# Patient Record
Sex: Female | Born: 1985 | Race: Black or African American | Hispanic: No | Marital: Single | Smoking: Former smoker
Health system: Southern US, Community
[De-identification: ages and names within clinical notes are randomized; demographics above are authoritative.]

## PROBLEM LIST (undated history)

## (undated) DIAGNOSIS — D571 Sickle-cell disease without crisis: Secondary | ICD-10-CM

---

## 2017-01-23 ENCOUNTER — Emergency Department (HOSPITAL_COMMUNITY): Payer: Medicaid Other

## 2017-01-23 ENCOUNTER — Encounter (HOSPITAL_COMMUNITY): Payer: Self-pay | Admitting: Oncology

## 2017-01-23 ENCOUNTER — Emergency Department (HOSPITAL_COMMUNITY)
Admission: EM | Admit: 2017-01-23 | Discharge: 2017-01-23 | Disposition: A | Discharge status: Home / Self Care | Payer: Medicaid Other | Attending: Emergency Medicine | Admitting: Emergency Medicine

## 2017-01-23 DIAGNOSIS — Y939 Activity, unspecified: Secondary | ICD-10-CM | POA: Diagnosis not present

## 2017-01-23 DIAGNOSIS — F17200 Nicotine dependence, unspecified, uncomplicated: Secondary | ICD-10-CM | POA: Insufficient documentation

## 2017-01-23 DIAGNOSIS — Y929 Unspecified place or not applicable: Secondary | ICD-10-CM | POA: Insufficient documentation

## 2017-01-23 DIAGNOSIS — M542 Cervicalgia: Secondary | ICD-10-CM

## 2017-01-23 DIAGNOSIS — Y999 Unspecified external cause status: Secondary | ICD-10-CM | POA: Diagnosis not present

## 2017-01-23 DIAGNOSIS — S1093XA Contusion of unspecified part of neck, initial encounter: Secondary | ICD-10-CM | POA: Diagnosis not present

## 2017-01-23 DIAGNOSIS — Z79899 Other long term (current) drug therapy: Secondary | ICD-10-CM | POA: Diagnosis not present

## 2017-01-23 DIAGNOSIS — S199XXA Unspecified injury of neck, initial encounter: Secondary | ICD-10-CM | POA: Diagnosis present

## 2017-01-23 HISTORY — DX: Sickle-cell disease without crisis: D57.1

## 2017-01-23 LAB — POC URINE PREG, ED: Preg Test, Ur: NEGATIVE

## 2017-01-23 MED ORDER — ACETAMINOPHEN 500 MG PO TABS
1000.0000 mg | ORAL_TABLET | Freq: Once | ORAL | Status: AC
  Administered 2017-01-23: 1000 mg via ORAL
  Filled 2017-01-23: qty 2

## 2017-01-23 NOTE — Discharge Instructions (Signed)
X-rays show no fracture. You will be sore for several days. Tylenol or ibuprofen for pain.
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
## 2017-01-23 NOTE — ED Notes (Signed)
Patient says she needs place to go. SW consult put in.
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
## 2017-02-01 ENCOUNTER — Emergency Department (HOSPITAL_COMMUNITY)
Admission: EM | Admit: 2017-02-01 | Discharge: 2017-02-01 | Disposition: A | Discharge status: Home / Self Care | Payer: Medicaid Other | Attending: Emergency Medicine | Admitting: Emergency Medicine

## 2017-02-01 ENCOUNTER — Encounter (HOSPITAL_COMMUNITY): Payer: Self-pay | Admitting: Emergency Medicine

## 2017-02-01 ENCOUNTER — Emergency Department (HOSPITAL_COMMUNITY): Payer: Medicaid Other

## 2017-02-01 DIAGNOSIS — D5700 Hb-SS disease with crisis, unspecified: Secondary | ICD-10-CM | POA: Insufficient documentation

## 2017-02-01 DIAGNOSIS — F17200 Nicotine dependence, unspecified, uncomplicated: Secondary | ICD-10-CM | POA: Diagnosis not present

## 2017-02-01 DIAGNOSIS — M79604 Pain in right leg: Secondary | ICD-10-CM | POA: Diagnosis present

## 2017-02-01 LAB — COMPREHENSIVE METABOLIC PANEL
ALBUMIN: 3.8 g/dL (ref 3.5–5.0)
ALT: 21 U/L (ref 14–54)
AST: 24 U/L (ref 15–41)
Alkaline Phosphatase: 42 U/L (ref 38–126)
Anion gap: 3 — ABNORMAL LOW (ref 5–15)
BILIRUBIN TOTAL: 1.1 mg/dL (ref 0.3–1.2)
BUN: 8 mg/dL (ref 6–20)
CHLORIDE: 109 mmol/L (ref 101–111)
CO2: 26 mmol/L (ref 22–32)
Calcium: 8.9 mg/dL (ref 8.9–10.3)
Creatinine, Ser: 0.69 mg/dL (ref 0.44–1.00)
GFR calc Af Amer: >60 mL/min (ref >60)
GFR calc non Af Amer: >60 mL/min (ref >60)
GLUCOSE: 90 mg/dL (ref 65–99)
POTASSIUM: 4.3 mmol/L (ref 3.5–5.1)
Sodium: 138 mmol/L (ref 135–145)
Total Protein: 6.4 g/dL — ABNORMAL LOW (ref 6.5–8.1)

## 2017-02-01 LAB — CBC WITH DIFFERENTIAL/PLATELET
BASOS PCT: 1 %
Basophils Absolute: 0.1 10*3/uL (ref 0.0–0.1)
EOS PCT: 3 %
Eosinophils Absolute: 0.2 10*3/uL (ref 0.0–0.7)
HEMATOCRIT: 31.3 % — AB (ref 36.0–46.0)
Hemoglobin: 11.4 g/dL — ABNORMAL LOW (ref 12.0–15.0)
LYMPHS ABS: 3 10*3/uL (ref 0.7–4.0)
Lymphocytes Relative: 41 %
MCH: 26.6 pg (ref 26.0–34.0)
MCHC: 36.4 g/dL — AB (ref 30.0–36.0)
MCV: 73 fL — AB (ref 78.0–100.0)
MONOS PCT: 8 %
Monocytes Absolute: 0.6 10*3/uL (ref 0.1–1.0)
NEUTROS ABS: 3.4 10*3/uL (ref 1.7–7.7)
Neutrophils Relative %: 47 %
Platelets: 277 10*3/uL (ref 150–400)
RBC: 4.29 MIL/uL (ref 3.87–5.11)
RDW: 14.8 % (ref 11.5–15.5)
WBC: 7.3 10*3/uL (ref 4.0–10.5)

## 2017-02-01 LAB — RETICULOCYTES
RBC.: 4.29 MIL/uL (ref 3.87–5.11)
RETIC COUNT ABSOLUTE: 175.9 10*3/uL (ref 19.0–186.0)
Retic Ct Pct: 4.1 % — ABNORMAL HIGH (ref 0.4–3.1)

## 2017-02-01 MED ORDER — HYDROMORPHONE HCL 2 MG/ML IJ SOLN
0.5000 mg | INTRAMUSCULAR | Status: AC
  Administered 2017-02-01: 1 mg via INTRAVENOUS
  Filled 2017-02-01: qty 1

## 2017-02-01 MED ORDER — HYDROMORPHONE HCL 2 MG/ML IJ SOLN: 0.5000 mg | INTRAMUSCULAR | Status: AC

## 2017-02-01 MED ORDER — PROMETHAZINE HCL 25 MG PO TABS
25.0000 mg | ORAL_TABLET | ORAL | Status: DC | PRN
  Administered 2017-02-01: 25 mg via ORAL
  Filled 2017-02-01: qty 1

## 2017-02-01 MED ORDER — ONDANSETRON HCL 4 MG/2ML IJ SOLN
4.0000 mg | Freq: Once | INTRAMUSCULAR | Status: AC
  Administered 2017-02-01: 4 mg via INTRAVENOUS
  Filled 2017-02-01: qty 2

## 2017-02-01 MED ORDER — HYDROMORPHONE HCL 2 MG/ML IJ SOLN: 0.5000 mg | INTRAMUSCULAR | Status: DC

## 2017-02-01 MED ORDER — OXYCODONE-ACETAMINOPHEN 5-325 MG PO TABS: 2.0000 | ORAL_TABLET | ORAL | 0 refills | Status: DC | PRN

## 2017-02-01 MED ORDER — HYDROMORPHONE HCL 2 MG/ML IJ SOLN
0.5000 mg | INTRAMUSCULAR | Status: DC
Start: 2017-02-01 — End: 2017-02-02

## 2017-02-01 MED ORDER — KETOROLAC TROMETHAMINE 30 MG/ML IJ SOLN
30.0000 mg | INTRAMUSCULAR | Status: AC
  Administered 2017-02-01: 30 mg via INTRAVENOUS
  Filled 2017-02-01: qty 1

## 2017-02-01 MED ORDER — DIPHENHYDRAMINE HCL 50 MG/ML IJ SOLN
25.0000 mg | Freq: Once | INTRAMUSCULAR | Status: AC
  Administered 2017-02-01: 25 mg via INTRAVENOUS
  Filled 2017-02-01: qty 1

## 2017-02-01 MED ORDER — DEXTROSE-NACL 5-0.45 % IV SOLN
INTRAVENOUS | Status: DC
  Administered 2017-02-01: 17:00:00 via INTRAVENOUS
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
## 2017-02-01 NOTE — ED Notes (Signed)
Patient transported to X-ray 

## 2017-02-01 NOTE — ED Notes (Signed)
ED Provider at bedside. 
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
## 2017-02-01 NOTE — ED Triage Notes (Signed)
Pt here for sickle cell pain in right leg and left arm x 2 days

## 2017-02-01 NOTE — ED Notes (Signed)
Pt vomiting. Zofran given. Pt still rating pain at a 5/10. PA aware. Will cont to monitor.

## 2017-02-18 ENCOUNTER — Ambulatory Visit (INDEPENDENT_AMBULATORY_CARE_PROVIDER_SITE_OTHER): Payer: Medicaid Other | Admitting: Family Medicine

## 2017-02-18 ENCOUNTER — Encounter: Payer: Self-pay | Admitting: Family Medicine

## 2017-02-18 ENCOUNTER — Non-Acute Institutional Stay (HOSPITAL_COMMUNITY)
Admission: AD | Admit: 2017-02-18 | Discharge: 2017-02-18 | Disposition: A | Discharge status: Home / Self Care | Payer: Medicaid Other | Source: Ambulatory Visit | Attending: Internal Medicine | Admitting: Internal Medicine

## 2017-02-18 VITALS — BP 106/75 | HR 78 | Temp 98.5°F | Resp 16 | Ht 62.0 in | Wt 147.0 lb

## 2017-02-18 DIAGNOSIS — Z87891 Personal history of nicotine dependence: Secondary | ICD-10-CM | POA: Diagnosis not present

## 2017-02-18 DIAGNOSIS — D57219 Sickle-cell/Hb-C disease with crisis, unspecified: Secondary | ICD-10-CM | POA: Diagnosis not present

## 2017-02-18 DIAGNOSIS — Z79899 Other long term (current) drug therapy: Secondary | ICD-10-CM | POA: Insufficient documentation

## 2017-02-18 DIAGNOSIS — Z23 Encounter for immunization: Secondary | ICD-10-CM

## 2017-02-18 DIAGNOSIS — D5720 Sickle-cell/Hb-C disease without crisis: Secondary | ICD-10-CM | POA: Insufficient documentation

## 2017-02-18 DIAGNOSIS — M79604 Pain in right leg: Secondary | ICD-10-CM | POA: Diagnosis present

## 2017-02-18 LAB — FERRITIN: FERRITIN: 171 ng/mL — AB (ref 10–154)

## 2017-02-18 MED ORDER — DIPHENHYDRAMINE HCL 25 MG PO CAPS
50.0000 mg | ORAL_CAPSULE | Freq: Once | ORAL | Status: AC
  Administered 2017-02-18: 50 mg via ORAL
  Filled 2017-02-18: qty 2

## 2017-02-18 MED ORDER — HYDROMORPHONE HCL 2 MG/ML IJ SOLN
1.0000 mg | Freq: Once | INTRAMUSCULAR | Status: AC
  Administered 2017-02-18: 1 mg via INTRAVENOUS
  Filled 2017-02-18: qty 1

## 2017-02-18 MED ORDER — HYDROMORPHONE HCL 2 MG/ML IJ SOLN
INTRAMUSCULAR | Status: AC
  Administered 2017-02-18: 1 mg via INTRAVENOUS
  Filled 2017-02-18: qty 1

## 2017-02-18 MED ORDER — PROMETHAZINE HCL 25 MG PO TABS
12.5000 mg | ORAL_TABLET | Freq: Once | ORAL | Status: AC
  Administered 2017-02-18: 12.5 mg via ORAL
  Filled 2017-02-18: qty 1

## 2017-02-18 MED ORDER — IBUPROFEN 800 MG PO TABS: 800.0000 mg | ORAL_TABLET | Freq: Three times a day (TID) | ORAL | 0 refills | Status: DC | PRN

## 2017-02-18 MED ORDER — FOLIC ACID 1 MG PO TABS: 1.0000 mg | ORAL_TABLET | Freq: Every day | ORAL | 11 refills | Status: DC

## 2017-02-18 MED ORDER — HYDROMORPHONE HCL 2 MG/ML IJ SOLN
1.0000 mg | Freq: Once | INTRAMUSCULAR | Status: AC
  Administered 2017-02-18: 1 mg via INTRAVENOUS

## 2017-02-18 MED ORDER — KETOROLAC TROMETHAMINE 30 MG/ML IJ SOLN
30.0000 mg | Freq: Once | INTRAMUSCULAR | Status: AC
  Administered 2017-02-18: 30 mg via INTRAVENOUS
  Filled 2017-02-18: qty 1

## 2017-02-18 MED ORDER — HYDROCODONE-ACETAMINOPHEN 5-325 MG PO TABS: 1.0000 | ORAL_TABLET | Freq: Four times a day (QID) | ORAL | 0 refills | Status: DC | PRN

## 2017-02-18 MED ORDER — ONDANSETRON HCL 4 MG/2ML IJ SOLN
4.0000 mg | Freq: Once | INTRAMUSCULAR | Status: AC
  Administered 2017-02-18: 4 mg via INTRAVENOUS
  Filled 2017-02-18: qty 2

## 2017-02-18 MED ORDER — HYDROMORPHONE HCL 2 MG/ML IJ SOLN
2.0000 mg | Freq: Once | INTRAMUSCULAR | Status: AC
  Administered 2017-02-18: 2 mg via INTRAVENOUS
  Filled 2017-02-18: qty 1

## 2017-02-18 MED ORDER — DEXTROSE-NACL 5-0.45 % IV SOLN
INTRAVENOUS | Status: DC
  Administered 2017-02-18: 13:00:00 via INTRAVENOUS

## 2017-02-18 NOTE — Progress Notes (Signed)
Subjective:    Patient ID: Hannah Hawkins, female    DOB: October 23, 1986, 31 y.o.   MRN: [id number]  HPI [name], a 31 year old female with a history of sickle cell anemia, presents to establish care. Patient recently relocated from [place]. She says that she did not have a primary provider and has been using the emergency department for all primary needs. She is complaining of right leg and ankle pain that is consistent with sickle cell anemia. She attributes current pain crisis to changes in weather. She says that she has not attempted any OTC analgesics to alleviate current symptoms. Pain intensity is currently 8/10 described as constant and throbbing. She denies headache, chest pains, shortness of breath, dysuria, nausea, vomiting, or diarrhea.  Past Medical History:  Diagnosis Date  . Sickle cell anemia (HCC)    Immunization History  Administered Date(s) Administered  . Pneumococcal Polysaccharide-23 02/18/2017  . Tdap 02/18/2017   Social History   Social History  . Marital status: Single    Spouse name: N/A  . Number of children: N/A  . Years of education: N/A   Occupational History  . Not on file.   Social History Main Topics  . Smoking status: Former Games developer  . Smokeless tobacco: Never Used  . Alcohol use Yes  . Drug use: No  . Sexual activity: Yes   Other Topics Concern  . Not on file   Social History Narrative  . No narrative on file   Review of Systems  Constitutional: Negative for unexpected weight change.  HENT: Negative.   Eyes: Negative.  Negative for photophobia and visual disturbance.  Respiratory: Negative.   Cardiovascular: Positive for leg swelling.  Gastrointestinal: Negative.   Endocrine: Negative.  Negative for polydipsia, polyphagia and polyuria.  Genitourinary: Negative.   Musculoskeletal: Positive for arthralgias and myalgias.  Skin: Negative.   Allergic/Immunologic: Negative.  Negative for immunocompromised state.   Neurological: Negative.   Hematological: Negative.   Psychiatric/Behavioral: Negative.        Objective:   Physical Exam  Constitutional: She is oriented to person, place, and time. She appears well-developed and well-nourished.  HENT:  Head: Normocephalic and atraumatic.  Right Ear: External ear normal.  Left Ear: External ear normal.  Mouth/Throat: Oropharynx is clear and moist.  Eyes: Conjunctivae and EOM are normal. Pupils are equal, round, and reactive to light.  Neck: Normal range of motion. Neck supple.  Cardiovascular: Normal rate, regular rhythm, normal heart sounds and intact distal pulses.   Pulmonary/Chest: Effort normal and breath sounds normal.  Abdominal: Soft. Bowel sounds are normal.  Musculoskeletal:       Right knee: She exhibits decreased range of motion.       Right ankle: She exhibits swelling. She exhibits normal pulse.  Neurological: She is alert and oriented to person, place, and time. She has normal reflexes.  Skin: Skin is warm and dry.  Psychiatric: She has a normal mood and affect. Her behavior is normal. Judgment and thought content normal.      BP 106/75 (BP Location: Left Arm, Patient Position: Sitting, Cuff Size: Normal)   Pulse 78   Temp 98.5 F (36.9 C) (Oral)   Resp 16   Ht 5\' 2"  (1.575 m)   Wt 147 lb (66.7 kg)   LMP 02/01/2017   SpO2 100%   BMI 26.89 kg/m  Assessment & Plan:   1. Sickle cell disease, type [place], with unspecified crisis (HCC) Sickle cell disease -  Will start  folic acid 1 mg daily to prevent aplastic bone marrow crises.  Patient will transition to the day infusion center for extended observation and pain management for 8/10 pain related to sickle cell anemia.    Patient will be admitted to the day infusion center for extended observation  Start IV D5.45 for cellular rehydration at 150/hr  Start Toradol 30 mg IV times 1  Start Dilaudid per clinician assisted doses  Patient will be re-evaluated for pain intensity in  the context of function and relationship to baseline as care progresses.  If no significant pain relief, will transfer patient to inpatient services for a higher level of care.     Pulmonary evaluation - Patient denies severe recurrent wheezes, shortness of breath with exercise, or persistent cough. If these symptoms develop, pulmonary function tests with spirometry will be ordered, and if abnormal, plan on referral to Pulmonology for further evaluation.  Cardiac - Baseline EKG Eye -[facility], with Dr. Carmela RimaNarendra Patel  Immunization status - Tdap and Pneumococcal 23 received today. Declined influenza vaccination.   Acute and chronic painful epi+3-sodes - We agreed on hydrocodone-acetaminophen 5-325 mg today.   We discussed that pt is to receive her Schedule II prescriptions only from us. Pt is also aware that the prescription history is available to us online through the [facility] CS[facility]. Controlled substance agreement signed 02/18/2017.  We reminded Ms. Heinkel that all patients receiving Schedule II narcotics must be seen for follow within one month of prescription being requested. We reviewed the terms of our pain agreement, including the need to keep medicines in a safe locked location away from children or pets, and the need to report excess sedation or constipation, measures to avoid constipation, and policies related to early refills and stolen prescriptions. According to the [place] [facility] program, we have reviewed details related to analgesia, adverse effects, aberrant behaviors.  - Hemoglobinopathy evaluation - Vitamin D, 25-hydroxy - Ferritin - EKG 12-Lead - HYDROcodone-acetaminophen (NORCO/VICODIN) 5-325 MG tablet; Take 1 tablet by mouth every 6 (six) hours as needed for moderate pain.  Dispense: 30 tablet; Refill: 0 - ibuprofen (ADVIL,MOTRIN) 800 MG tablet; Take 1 tablet (800 mg total) by mouth every 8 (eight) hours as needed for moderate  pain.  Dispense: 30 tablet; Refill: 0 - Hemoglobinopathy Evaluation  2. Immunization due - Pneumococcal polysaccharide vaccine 23-valent greater than or equal to 2yo subcutaneous/IM  3. Need for Tdap vaccination  - Tdap vaccine greater than or equal to 7yo IM      RTC: 1 month for sickle cell anemia
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
## 2017-02-18 NOTE — H&P (Signed)
[facility] History and Physical   Date: 02/18/2017  Patient name: Hannah Hawkins Medical record number: [id number] Date of birth: June 25, 1986 Age: 31 y.o. Gender: female PCP: Massie Maroon, FNP  Attending physician: Quentin Angst, MD  Chief Complaint: Right lower extremity pain  History of Present Illness: [name], a 31 year old female with a history of sickle cell anemia, HbSC presents complaining of pain to right lower extremity several days ago that is consistent with previous sickle cell crisis. She recently relocated from [place]. Patient is opiate naive, she has never had a primary care provider and typically goes to the emergency department for all sickle cell needs. She attributes current crisis to changes in weather. Current pain intensity is 8/10 described as constant and throbbing. She has not used any prescribed or OTC analgesics to alleviate current symptoms. She says that pain is aggravated by activity. She denies headache, chest pain, SOB, dysuria, nausea, vomiting, or diarrhea.   Meds: Prescriptions Prior to Admission  Medication Sig Dispense Refill Last Dose  . HYDROcodone-acetaminophen (NORCO/VICODIN) 5-325 MG tablet Take 1 tablet by mouth every 6 (six) hours as needed for moderate pain. 30 tablet 0   . ibuprofen (ADVIL,MOTRIN) 800 MG tablet Take 1 tablet (800 mg total) by mouth every 8 (eight) hours as needed for moderate pain. 30 tablet 0   . omeprazole (PRILOSEC) 20 MG capsule Take 20 mg by mouth.   Not Taking  . sucralfate (CARAFATE) 1 g tablet Take 1 g by mouth 4 (four) times daily.   Not Taking    Allergies: Penicillins Past Medical History:  Diagnosis Date  . Sickle cell anemia (HCC)    No past surgical history on file. No family history on file. Social History   Social History  . Marital status: Single    Spouse name: N/A  . Number of children: N/A  . Years of education: N/A   Occupational History  . Not on file.    Social History Main Topics  . Smoking status: Former Games developer  . Smokeless tobacco: Never Used  . Alcohol use Yes  . Drug use: No  . Sexual activity: Yes   Other Topics Concern  . Not on file   Social History Narrative  . No narrative on file    Review of Systems: Constitutional: negative for fatigue Eyes: negative Ears, nose, mouth, throat, and face: negative Respiratory: negative for chronic bronchitis, dyspnea on exertion and wheezing Cardiovascular: negative for chest pain, dyspnea, orthopnea, palpitations and tachypnea Gastrointestinal: negative for diarrhea, nausea and reflux symptoms Genitourinary:negative for dysuria Integument/breast: negative Hematologic/lymphatic: negative Musculoskeletal:positive for myalgias Neurological: negative Behavioral/Psych: negative Endocrine: negative Allergic/Immunologic: negative  Physical Exam: Last menstrual period 02/01/2017. BP 108/67 (BP Location: Right Arm)   Pulse 62   Temp 98.3 F (36.8 C) (Oral)   LMP 02/01/2017   SpO2 100%   General Appearance:    Alert, cooperative, no distress, appears stated age  Head:    Normocephalic, without obvious abnormality, atraumatic  Eyes:    PERRL, conjunctiva/corneas clear, EOM's intact, fundi    benign, both eyes  Ears:    Normal TM's and external ear canals, both ears  Nose:   Nares normal, septum midline, mucosa normal, no drainage    or sinus tenderness  Throat:   Lips, mucosa, and tongue normal; teeth and gums normal  Neck:   Supple, symmetrical, trachea midline, no adenopathy;    thyroid:  no enlargement/tenderness/nodules; no carotid   bruit or JVD  Back:     Symmetric, no curvature, ROM normal, no CVA tenderness  Lungs:     Clear to auscultation bilaterally, respirations unlabored  Chest Wall:    No tenderness or deformity   Heart:    Regular rate and rhythm, S1 and S2 normal, no murmur, rub   or gallop  Abdomen:     Soft, non-tender, bowel sounds active all four  quadrants,    no masses, no organomegaly  Extremities:   Extremities normal, atraumatic, no cyanosis. Bilateral lower extremity edema (trace)  Pulses:   2+ and symmetric all extremities  Skin:   Skin color, texture, turgor normal, no rashes or lesions  Lymph nodes:   Cervical, supraclavicular, and axillary nodes normal  Neurologic:   CNII-XII intact, normal strength, sensation and reflexes    throughout    Lab results: No results found for this or any previous visit (from the past 24 hour(s)).  Imaging results:  No results found.   Assessment & Plan:  Patient will be admitted to the day infusion center for extended observation  Start IV D5.45 for cellular rehydration at 150/hr  Start Toradol 30 mg IV every 6 hours times one  Start Dilaudid IV times one  Patient will be re-evaluated for pain intensity in the context of function and relationship to baseline as care progresses.  If no significant pain relief, will transfer patient to inpatient services for a higher level of care.   Will check CMP, reticulocytes and CBC w/differential   Hannah Hawkins M 02/18/2017, 12:09 PM

## 2017-02-18 NOTE — Progress Notes (Signed)
Patient ID: Hannah Hawkins, female   DOB: 1986-10-23, 89[name]1 y.o.   MRN: [id number]  [facility] 10/10 pain in her legs and arms. Pt received IV fluids and medications; tolerated treatment well.  Pt rated her pain 0/10 at discharge. Unable to provide ordered urine sample, no urine obtained. Pt sleepy but easily arousable with stable vital signs at time of discharge.  Pt discharged to home accompanied by friend.  Ardyth GalAnderson, Ximenna Fonseca Ann, RN 02/18/2017

## 2017-02-19 LAB — VITAMIN D 25 HYDROXY (VIT D DEFICIENCY, FRACTURES): VIT D 25 HYDROXY: 26 ng/mL — AB (ref 30–100)

## 2017-02-21 ENCOUNTER — Other Ambulatory Visit: Payer: Self-pay | Admitting: Family Medicine

## 2017-02-21 DIAGNOSIS — E559 Vitamin D deficiency, unspecified: Secondary | ICD-10-CM

## 2017-02-21 MED ORDER — ERGOCALCIFEROL 1.25 MG (50000 UT) PO CAPS: 50000.0000 [IU] | ORAL_CAPSULE | ORAL | 3 refills | Status: DC

## 2017-02-21 NOTE — Progress Notes (Signed)
Called, no answer. Left message for patient to return call. Thanks!  

## 2017-02-22 LAB — HEMOGLOBINOPATHY EVALUATION
HEMATOCRIT: 38.5 % (ref 35.0–45.0)
HGB A: 0 % — AB (ref >96.0)
Hemoglobin C  map: 45.4 % — ABNORMAL HIGH
Hemoglobin: 12.6 g/dL (ref 11.7–15.5)
Hgb A2 Quant: 4.8 % — ABNORMAL HIGH (ref 1.8–3.5)
Hgb F Quant: 1.6 % (ref <2.0)
Hgb S Quant: 48.2 % — ABNORMAL HIGH
MCH: 27.2 pg (ref 27.0–33.0)
MCV: 83 fL (ref 80.0–100.0)
RBC: 4.64 MIL/uL (ref 3.80–5.10)
RDW: 15.7 % — AB (ref 11.0–15.0)

## 2017-02-22 MED ORDER — ERGOCALCIFEROL 1.25 MG (50000 UT) PO CAPS: 50000.0000 [IU] | ORAL_CAPSULE | ORAL | 3 refills | Status: DC

## 2017-02-22 NOTE — Progress Notes (Signed)
Called, spoke with patient. Advised of low vitamin D levels and the needs to take vitamin D once weekly. Patient verbalized understanding. Thanks!

## 2017-02-22 NOTE — Addendum Note (Signed)
Addended by: Loney HeringBATTEN, Hamsini Verrilli E on: 02/22/2017 01:37 PM   Modules accepted: Orders

## 2017-02-24 ENCOUNTER — Encounter: Payer: Self-pay | Admitting: Family Medicine

## 2017-03-09 HISTORY — PX: EYE SURGERY: SHX253

## 2017-03-18 ENCOUNTER — Encounter (HOSPITAL_COMMUNITY): Payer: Self-pay | Admitting: Emergency Medicine

## 2017-03-18 DIAGNOSIS — D5700 Hb-SS disease with crisis, unspecified: Secondary | ICD-10-CM | POA: Insufficient documentation

## 2017-03-18 DIAGNOSIS — Z87891 Personal history of nicotine dependence: Secondary | ICD-10-CM | POA: Diagnosis not present

## 2017-03-18 DIAGNOSIS — Z79899 Other long term (current) drug therapy: Secondary | ICD-10-CM | POA: Insufficient documentation

## 2017-03-18 LAB — CBC WITH DIFFERENTIAL/PLATELET
BASOS ABS: 0.1 10*3/uL (ref 0.0–0.1)
BASOS PCT: 1 %
EOS ABS: 0.4 10*3/uL (ref 0.0–0.7)
Eosinophils Relative: 4 %
HEMATOCRIT: 31.6 % — AB (ref 36.0–46.0)
HEMOGLOBIN: 11.7 g/dL — AB (ref 12.0–15.0)
LYMPHS ABS: 3.2 10*3/uL (ref 0.7–4.0)
Lymphocytes Relative: 39 %
MCH: 26.8 pg (ref 26.0–34.0)
MCHC: 37 g/dL — ABNORMAL HIGH (ref 30.0–36.0)
MCV: 72.3 fL — AB (ref 78.0–100.0)
MONO ABS: 0.6 10*3/uL (ref 0.1–1.0)
MONOS PCT: 7 %
NEUTROS PCT: 50 %
Neutro Abs: 4.2 10*3/uL (ref 1.7–7.7)
Platelets: 274 10*3/uL (ref 150–400)
RBC: 4.37 MIL/uL (ref 3.87–5.11)
RDW: 14.4 % (ref 11.5–15.5)
WBC: 8.4 10*3/uL (ref 4.0–10.5)

## 2017-03-18 LAB — RETICULOCYTES
RBC.: 4.37 MIL/uL (ref 3.87–5.11)
RETIC COUNT ABSOLUTE: 144.2 10*3/uL (ref 19.0–186.0)
Retic Ct Pct: 3.3 % — ABNORMAL HIGH (ref 0.4–3.1)

## 2017-03-18 NOTE — ED Triage Notes (Signed)
C/o sickle cell crisis since yesterday with generalized pain all over.

## 2017-03-19 ENCOUNTER — Emergency Department (HOSPITAL_COMMUNITY)
Admission: EM | Admit: 2017-03-19 | Discharge: 2017-03-19 | Disposition: A | Discharge status: Home / Self Care | Payer: Medicaid Other | Attending: Emergency Medicine | Admitting: Emergency Medicine

## 2017-03-19 DIAGNOSIS — D5700 Hb-SS disease with crisis, unspecified: Secondary | ICD-10-CM

## 2017-03-19 LAB — COMPREHENSIVE METABOLIC PANEL
ALT: 61 U/L — AB (ref 14–54)
AST: 45 U/L — AB (ref 15–41)
Albumin: 4 g/dL (ref 3.5–5.0)
Alkaline Phosphatase: 40 U/L (ref 38–126)
Anion gap: 7 (ref 5–15)
BUN: 11 mg/dL (ref 6–20)
CHLORIDE: 108 mmol/L (ref 101–111)
CO2: 24 mmol/L (ref 22–32)
CREATININE: 0.85 mg/dL (ref 0.44–1.00)
Calcium: 9.1 mg/dL (ref 8.9–10.3)
GFR calc Af Amer: >60 mL/min (ref >60)
GFR calc non Af Amer: >60 mL/min (ref >60)
Glucose, Bld: 102 mg/dL — ABNORMAL HIGH (ref 65–99)
POTASSIUM: 4 mmol/L (ref 3.5–5.1)
SODIUM: 139 mmol/L (ref 135–145)
Total Bilirubin: 0.5 mg/dL (ref 0.3–1.2)
Total Protein: 6.7 g/dL (ref 6.5–8.1)

## 2017-03-19 MED ORDER — PROMETHAZINE HCL 25 MG PO TABS
25.0000 mg | ORAL_TABLET | Freq: Once | ORAL | Status: AC
  Administered 2017-03-19: 25 mg via ORAL
  Filled 2017-03-19: qty 1

## 2017-03-19 MED ORDER — HYDROMORPHONE HCL 1 MG/ML IJ SOLN
1.0000 mg | INTRAMUSCULAR | Status: AC | PRN
  Administered 2017-03-19 (×3): 1 mg via INTRAVENOUS
  Filled 2017-03-19 (×3): qty 1

## 2017-03-19 MED ORDER — DIPHENHYDRAMINE HCL 50 MG/ML IJ SOLN
25.0000 mg | Freq: Once | INTRAMUSCULAR | Status: AC
  Administered 2017-03-19: 25 mg via INTRAVENOUS
  Filled 2017-03-19: qty 1

## 2017-03-19 MED ORDER — KETOROLAC TROMETHAMINE 30 MG/ML IJ SOLN
30.0000 mg | Freq: Once | INTRAMUSCULAR | Status: AC
  Administered 2017-03-19: 30 mg via INTRAVENOUS
  Filled 2017-03-19: qty 1

## 2017-03-19 MED ORDER — OXYCODONE HCL 5 MG PO TABS: 5.0000 mg | ORAL_TABLET | Freq: Two times a day (BID) | ORAL | 0 refills | Status: DC | PRN

## 2017-03-19 MED ORDER — DEXTROSE-NACL 5-0.45 % IV SOLN
INTRAVENOUS | Status: DC
  Administered 2017-03-19: 03:00:00 via INTRAVENOUS

## 2017-03-19 NOTE — ED Provider Notes (Signed)
[facility]-EMERGENCY DEPT Provider Note   CSN: [id number] Arrival date & time: 03/18/17  2313  By signing my name below, I, Hannah Hawkins, attest that this documentation has been prepared under the direction and in the presence of [name], MD.  Electronically Signed: Octavia Hawkins, ED Scribe. 03/19/17. 2:16 AM.    History   Chief Complaint Chief Complaint  Patient presents with  . Sickle Cell Pain Crisis   The history is provided by the patient. No language interpreter was used.   HPI Comments: Hannah Hawkins is a 31 y.o. female who has a PMhx of sickle cell anemia presents to the Emergency Department complaining persistent, gradual worsening, generalized myalgias consistent with her sickle cell pain that began last night. She reports shooting pain in her bilateral wrists and bilateral lower extremities that shoot into her thighs. Pt notes she was seen one month ago at a clinic for her sickle cell pain and was given percocet but expresses she had not had any since. Pt recently had eye surgery and received an unknown pain medication which she took last night to relieve her symptoms with mild relief. She expresses she has not had any medication since. Per pt, she typically receives toradol and benadryl at the hospital for her sickle cell pain. She denies any other complaints.   Past Medical History:  Diagnosis Date  . Sickle cell anemia [facility])     Patient Active Problem List   Diagnosis Date Noted  . Sickle cell disease, type [place], with unspecified crisis (HCC) 02/18/2017    History reviewed. No pertinent surgical history.  OB History    No data available       Home Medications    Prior to Admission medications   Medication Sig Start Date End Date Taking? Authorizing Provider  ergocalciferol (VITAMIN D2) 50000 units capsule Take 1 capsule (50,000 Units total) by mouth once a week. 02/22/17  Yes Massie Maroon, FNP  erythromycin ophthalmic ointment Place 1 application into the  left eye 2 (two) times daily.   Yes Historical Provider, MD  HYDROcodone-acetaminophen (NORCO/VICODIN) 5-325 MG tablet Take 1 tablet by mouth every 6 (six) hours as needed for moderate pain. 02/18/17  Yes Massie Maroon, FNP  ibuprofen (ADVIL,MOTRIN) 800 MG tablet Take 1 tablet (800 mg total) by mouth every 8 (eight) hours as needed for moderate pain. 02/18/17  Yes Massie Maroon, FNP  ofloxacin (OCUFLOX) 0.3 % ophthalmic solution Place 1 drop into the left eye 4 (four) times daily.   Yes Historical Provider, MD  omeprazole (PRILOSEC) 20 MG capsule Take 20 mg by mouth.   Yes Historical Provider, MD  prednisoLONE acetate (PRED FORTE) 1 % ophthalmic suspension Place 1 drop into the left eye 4 (four) times daily.   Yes Historical Provider, MD  folic acid (FOLVITE) 1 MG tablet Take 1 tablet (1 mg total) by mouth daily. Patient not taking: Reported on 03/19/2017 02/18/17   Massie Maroon, FNP  oxyCODONE (ROXICODONE) 5 MG immediate release tablet Take 1 tablet (5 mg total) by mouth 2 (two) times daily as needed for severe pain. 03/19/17   [name], MD    Family History No family history on file.  Social History Social History  Substance Use Topics  . Smoking status: Former Games developer  . Smokeless tobacco: Never Used  . Alcohol use Yes     Allergies   Penicillins   Review of Systems Review of Systems  A complete 10 system review of systems was obtained  and all systems are negative except as noted in the HPI and PMH.   Physical Exam Updated Vital Signs BP 114/64   Pulse 76   Temp 97.7 F (36.5 C) (Oral)   Resp 16   Ht  (1.6 m)   Wt 142 lb (64.4 kg)   LMP 02/25/2017   SpO2 99%   BMI 25.15 kg/m   Physical Exam  Constitutional: She is oriented to person, place, and time. She appears well-developed and well-nourished. No distress.  HENT:  Head: Normocephalic and atraumatic.  Nose: Nose normal.  Mouth/Throat: Oropharynx is clear and moist. No oropharyngeal exudate.  Neck:  Normal range of motion. Neck supple. No JVD present. No tracheal deviation present. No thyromegaly present.  Cardiovascular: Normal rate, regular rhythm and normal heart sounds.  Exam reveals no gallop and no friction rub.   No murmur heard. Pulmonary/Chest: Effort normal and breath sounds normal. No respiratory distress. She has no wheezes. She exhibits no tenderness.  Abdominal: Soft. Bowel sounds are normal. She exhibits no distension and no mass. There is no tenderness. There is no rebound and no guarding.  Musculoskeletal: Normal range of motion. She exhibits no edema or tenderness.  Lymphadenopathy:    She has no cervical adenopathy.  Neurological: She is alert and oriented to person, place, and time. No cranial nerve deficit. She exhibits normal muscle tone.  Skin: Skin is warm. No rash noted. She is not diaphoretic. No erythema. No pallor.  Nursing note and vitals reviewed.    ED Treatments / Results  DIAGNOSTIC STUDIES: Oxygen Saturation is 100% on RA, normal by my interpretation.  COORDINATION OF CARE:  2:15 AM Discussed treatment plan with pt at bedside and pt agreed to plan.  Labs (all labs ordered are listed, but only abnormal results are displayed) Labs Reviewed  COMPREHENSIVE METABOLIC PANEL - Abnormal; Notable for the following:       Result Value   Glucose, Bld 102 (*)    AST 45 (*)    ALT 61 (*)    All other components within normal limits  CBC WITH DIFFERENTIAL/PLATELET - Abnormal; Notable for the following:    Hemoglobin 11.7 (*)    HCT 31.6 (*)    MCV 72.3 (*)    MCHC 37.0 (*)    All other components within normal limits  RETICULOCYTES - Abnormal; Notable for the following:    Retic Ct Pct 3.3 (*)    All other components within normal limits    EKG  EKG Interpretation None       Radiology No results found.  Procedures Procedures (including critical care time)  Medications Ordered in ED Medications  dextrose 5 %-0.45 % sodium chloride  infusion ( Intravenous New Bag/Given 03/19/17 0258)  ketorolac (TORADOL) 30 MG/ML injection 30 mg (30 mg Intravenous Given 03/19/17 0313)  HYDROmorphone (DILAUDID) injection 1 mg (1 mg Intravenous Given 03/19/17 0551)  promethazine (PHENERGAN) tablet 25 mg (25 mg Oral Given 03/19/17 0314)  diphenhydrAMINE (BENADRYL) injection 25 mg (25 mg Intravenous Given 03/19/17 0313)     Initial Impression / Assessment and Plan / ED Course  I have reviewed the triage vital signs and the nursing notes.  Pertinent labs & imaging results that were available during my care of the patient were reviewed by me and considered in my medical decision making (see chart for details).     Patient presents to the ED for sickle cell pain. Labs are reassuring.  She was given IVF, toradol, dilaudid for  pain.  She also got benadryl and phenergan as well.  After 3 doses of dilaudid she feels much better.  Plan to DC with PCP fu. She was given oxycodone to take at home as needed.  VS are normal. Patient safe for DC.  Final Clinical Impressions(s) / ED Diagnoses   Final diagnoses:  Sickle cell pain crisis (HCC)   I personally performed the services described in this documentation, which was scribed in my presence. The recorded information has been reviewed and is accurate.    New Prescriptions New Prescriptions   OXYCODONE (ROXICODONE) 5 MG IMMEDIATE RELEASE TABLET    Take 1 tablet (5 mg total) by mouth 2 (two) times daily as needed for severe pain.     [name], MD 03/19/17 [phone]

## 2017-03-19 NOTE — ED Notes (Signed)
Patient called for room x 2 - was told that she walked to [place] and would be back. Moved name back to waiting room.

## 2017-03-23 ENCOUNTER — Ambulatory Visit (INDEPENDENT_AMBULATORY_CARE_PROVIDER_SITE_OTHER): Payer: Medicaid Other | Admitting: Family Medicine

## 2017-03-23 ENCOUNTER — Encounter (HOSPITAL_COMMUNITY): Payer: Self-pay

## 2017-03-23 ENCOUNTER — Encounter: Payer: Self-pay | Admitting: Family Medicine

## 2017-03-23 VITALS — BP 123/69 | HR 86 | Temp 98.2°F | Resp 16 | Ht 62.0 in | Wt 139.0 lb

## 2017-03-23 DIAGNOSIS — N912 Amenorrhea, unspecified: Secondary | ICD-10-CM

## 2017-03-23 DIAGNOSIS — D5700 Hb-SS disease with crisis, unspecified: Secondary | ICD-10-CM | POA: Insufficient documentation

## 2017-03-23 DIAGNOSIS — Z114 Encounter for screening for human immunodeficiency virus [HIV]: Secondary | ICD-10-CM

## 2017-03-23 DIAGNOSIS — Z87891 Personal history of nicotine dependence: Secondary | ICD-10-CM | POA: Diagnosis not present

## 2017-03-23 DIAGNOSIS — R791 Abnormal coagulation profile: Secondary | ICD-10-CM | POA: Diagnosis not present

## 2017-03-23 DIAGNOSIS — R748 Abnormal levels of other serum enzymes: Secondary | ICD-10-CM | POA: Diagnosis not present

## 2017-03-23 DIAGNOSIS — D57219 Sickle-cell/Hb-C disease with crisis, unspecified: Secondary | ICD-10-CM

## 2017-03-23 LAB — HIV ANTIBODY (ROUTINE TESTING W REFLEX): HIV 1&2 Ab, 4th Generation: NONREACTIVE

## 2017-03-23 LAB — COMPLETE METABOLIC PANEL WITH GFR
ALBUMIN: 4.2 g/dL (ref 3.6–5.1)
ALK PHOS: 43 U/L (ref 33–115)
ALT: 58 U/L — ABNORMAL HIGH (ref 6–29)
AST: 30 U/L (ref 10–30)
BILIRUBIN TOTAL: 0.6 mg/dL (ref 0.2–1.2)
BUN: 8 mg/dL (ref 7–25)
CO2: 24 mmol/L (ref 20–31)
Calcium: 9.3 mg/dL (ref 8.6–10.2)
Chloride: 105 mmol/L (ref 98–110)
Creat: 0.91 mg/dL (ref 0.50–1.10)
GFR, EST NON AFRICAN AMERICAN: 84 mL/min (ref >=60)
GFR, Est African American: >89 mL/min (ref >=60)
GLUCOSE: 92 mg/dL (ref 65–99)
POTASSIUM: 3.7 mmol/L (ref 3.5–5.3)
SODIUM: 138 mmol/L (ref 135–146)
TOTAL PROTEIN: 7 g/dL (ref 6.1–8.1)

## 2017-03-23 MED ORDER — IBUPROFEN 800 MG PO TABS: 800.0000 mg | ORAL_TABLET | Freq: Three times a day (TID) | ORAL | 0 refills | Status: DC | PRN

## 2017-03-23 MED ORDER — OXYCODONE HCL 5 MG PO TABS: 5.0000 mg | ORAL_TABLET | Freq: Four times a day (QID) | ORAL | 0 refills | Status: DC | PRN

## 2017-03-23 NOTE — Patient Instructions (Addendum)
Sickle cell pain management:  Oxycodone 5 mg every 6 hours for severe pain Ibuprofen 800 mg every 8 hours as needed for mild to moderate pain with food.  Increase hydration to 64 ounces per day Increase rest and handwashing.   Patient will return for Pap Smear in 1 month.  Sickle Cell Anemia, Adult Sickle cell anemia is a condition where your red blood cells are shaped like sickles. Red blood cells carry oxygen through the body. Sickle-shaped red blood cells do not live as long as normal red blood cells. They also clump together and block blood from flowing through the blood vessels. These things prevent the body from getting enough oxygen. Sickle cell anemia causes organ damage and pain. It also increases the risk of infection. Follow these instructions at home:  Drink enough fluid to keep your pee (urine) clear or pale yellow. Drink more in hot weather and during exercise.  Do not smoke. Smoking lowers oxygen levels in the blood.  Only take over-the-counter or prescription medicines as told by your doctor.  Take antibiotic medicines as told by your doctor. Make sure you finish them even if you start to feel better.  Take supplements as told by your doctor.  Consider wearing a medical alert bracelet. This tells anyone caring for you in an emergency of your condition.  When traveling, keep your medical information, doctors' names, and the medicines you take with you at all times.  If you have a fever, do not take fever medicines right away. This could cover up a problem. Tell your doctor.  Keep all follow-up visits with your doctor. Sickle cell anemia requires regular medical care. Contact a doctor if: You have a fever. Get help right away if:  You feel dizzy or faint.  You have new belly (abdominal) pain, especially on the left side near the stomach area.  You have a lasting, often uncomfortable and painful erection of the penis (priapism). If it is not treated right away, you  will become unable to have sex (impotence).  You have numbness in your arms or legs or you have a hard time moving them.  You have a hard time talking.  You have a fever or lasting symptoms for more than 2-3 days.  You have a fever and your symptoms suddenly get worse.  You have signs or symptoms of infection. These include:  Chills.  Being more tired than normal (lethargy).  Irritability.  Poor eating.  Throwing up (vomiting).  You have pain that is not helped with medicine.  You have shortness of breath.  You have pain in your chest.  You are coughing up pus-like or bloody mucus.  You have a stiff neck.  Your feet or hands swell or have pain.  Your belly looks bloated.  Your joints hurt. This information is not intended to replace advice given to you by your health care provider. Make sure you discuss any questions you have with your health care provider. Document Released: 09/26/2013 Document Revised: 05/13/2016 Document Reviewed: 07/18/2013 Elsevier Interactive Patient Education  2017 [employer] Inc[employer]  Sickle Cell Anemia, Adult Sickle cell anemia is a condition where your red blood cells are shaped like sickles. Red blood cells carry oxygen through the body. Sickle-shaped red blood cells do not live as long as normal red blood cells. They also clump together and block blood from flowing through the blood vessels. These things prevent the body from getting enough oxygen. Sickle cell anemia causes organ damage and pain. It  also increases the risk of infection. Follow these instructions at home:  Drink enough fluid to keep your pee (urine) clear or pale yellow. Drink more in hot weather and during exercise.  Do not smoke. Smoking lowers oxygen levels in the blood.  Only take over-the-counter or prescription medicines as told by your doctor.  Take antibiotic medicines as told by your doctor. Make sure you finish them even if you start to feel better.  Take  supplements as told by your doctor.  Consider wearing a medical alert bracelet. This tells anyone caring for you in an emergency of your condition.  When traveling, keep your medical information, doctors' names, and the medicines you take with you at all times.  If you have a fever, do not take fever medicines right away. This could cover up a problem. Tell your doctor.  Keep all follow-up visits with your doctor. Sickle cell anemia requires regular medical care. Contact a doctor if: You have a fever. Get help right away if:  You feel dizzy or faint.  You have new belly (abdominal) pain, especially on the left side near the stomach area.  You have a lasting, often uncomfortable and painful erection of the penis (priapism). If it is not treated right away, you will become unable to have sex (impotence).  You have numbness in your arms or legs or you have a hard time moving them.  You have a hard time talking.  You have a fever or lasting symptoms for more than 2-3 days.  You have a fever and your symptoms suddenly get worse.  You have signs or symptoms of infection. These include:  Chills.  Being more tired than normal (lethargy).  Irritability.  Poor eating.  Throwing up (vomiting).  You have pain that is not helped with medicine.  You have shortness of breath.  You have pain in your chest.  You are coughing up pus-like or bloody mucus.  You have a stiff neck.  Your feet or hands swell or have pain.  Your belly looks bloated.  Your joints hurt. This information is not intended to replace advice given to you by your health care provider. Make sure you discuss any questions you have with your health care provider. Document Released: 09/26/2013 Document Revised: 05/13/2016 Document Reviewed: 07/18/2013 Elsevier Interactive Patient Education  2017 Elsevier Inc.  Sickle Cell Anemia, Adult Sickle cell anemia is a condition where your red blood cells are shaped  like sickles. Red blood cells carry oxygen through the body. Sickle-shaped red blood cells do not live as long as normal red blood cells. They also clump together and block blood from flowing through the blood vessels. These things prevent the body from getting enough oxygen. Sickle cell anemia causes organ damage and pain. It also increases the risk of infection. Follow these instructions at home:  Drink enough fluid to keep your pee (urine) clear or pale yellow. Drink more in hot weather and during exercise.  Do not smoke. Smoking lowers oxygen levels in the blood.  Only take over-the-counter or prescription medicines as told by your doctor.  Take antibiotic medicines as told by your doctor. Make sure you finish them even if you start to feel better.  Take supplements as told by your doctor.  Consider wearing a medical alert bracelet. This tells anyone caring for you in an emergency of your condition.  When traveling, keep your medical information, doctors' names, and the medicines you take with you at all times.  If  you have a fever, do not take fever medicines right away. This could cover up a problem. Tell your doctor.  Keep all follow-up visits with your doctor. Sickle cell anemia requires regular medical care. Contact a doctor if: You have a fever. Get help right away if:  You feel dizzy or faint.  You have new belly (abdominal) pain, especially on the left side near the stomach area.  You have a lasting, often uncomfortable and painful erection of the penis (priapism). If it is not treated right away, you will become unable to have sex (impotence).  You have numbness in your arms or legs or you have a hard time moving them.  You have a hard time talking.  You have a fever or lasting symptoms for more than 2-3 days.  You have a fever and your symptoms suddenly get worse.  You have signs or symptoms of infection. These include:  Chills.  Being more tired than normal  (lethargy).  Irritability.  Poor eating.  Throwing up (vomiting).  You have pain that is not helped with medicine.  You have shortness of breath.  You have pain in your chest.  You are coughing up pus-like or bloody mucus.  You have a stiff neck.  Your feet or hands swell or have pain.  Your belly looks bloated.  Your joints hurt. This information is not intended to replace advice given to you by your health care provider. Make sure you discuss any questions you have with your health care provider. Document Released: 09/26/2013 Document Revised: 05/13/2016 Document Reviewed: 07/18/2013 Elsevier Interactive Patient Education  2017 [employer].

## 2017-03-23 NOTE — Progress Notes (Signed)
Subjective:    Patient ID: Hannah Hawkins, female    DOB: 11/13/1986, 31 y.o.   MRN: [id number]  HPI Hannah Hawkins, a 31 year old female with a history of sickle cell anemia, presents for a 1 month follow-up. Patient recently relocated from [place]. She says that she had an eye surgery 1 week ago with Dr. Carmela Rima. She is scheduled to follow up with Dr. Allena Katz in 1 month. She is complaining of right back pain that is consistent with sickle cell  anemia. She is currently out of opiate medication. She last had Ibuprofen this am with minimal relief. Current pain intensity is 6-7/10. She attributes current pain crisis to changes in weather. She says that she has not attempted any OTC analgesics to alleviate current symptoms.  She denies headache, chest pains, shortness of breath, dysuria, nausea, vomiting, or diarrhea.  Past Medical History:  Diagnosis Date  . Sickle cell anemia (HCC)    Immunization History  Administered Date(s) Administered  . Pneumococcal Polysaccharide-23 02/18/2017  . Tdap 02/18/2017   Social History   Social History  . Marital status: Single    Spouse name: N/A  . Number of children: N/A  . Years of education: N/A   Occupational History  . Not on file.   Social History Main Topics  . Smoking status: Former Games developer  . Smokeless tobacco: Never Used  . Alcohol use Yes  . Drug use: No  . Sexual activity: Yes   Other Topics Concern  . Not on file   Social History Narrative  . No narrative on file   Review of Systems  Constitutional: Negative for unexpected weight change.  HENT: Negative.   Eyes: Positive for pain (Recent eye surgery). Negative for visual disturbance.  Respiratory: Negative.   Cardiovascular: Negative for chest pain, palpitations and leg swelling.  Gastrointestinal: Negative.   Endocrine: Negative.  Negative for polydipsia, polyphagia and polyuria.  Genitourinary: Negative.   Musculoskeletal: Positive for arthralgias, back  pain and myalgias.  Skin: Negative.   Allergic/Immunologic: Negative.  Negative for immunocompromised state.  Neurological: Negative.   Hematological: Negative.   Psychiatric/Behavioral: Negative.        Objective:   Physical Exam  Constitutional: She is oriented to person, place, and time. She appears well-developed and well-nourished.  HENT:  Head: Normocephalic and atraumatic.  Right Ear: External ear normal.  Left Ear: External ear normal.  Mouth/Throat: Oropharynx is clear and moist.  Eyes: Conjunctivae and EOM are normal. Pupils are equal, round, and reactive to light.  Neck: Normal range of motion. Neck supple.  Cardiovascular: Normal rate, regular rhythm, normal heart sounds and intact distal pulses.   Pulmonary/Chest: Effort normal and breath sounds normal.  Abdominal: Soft. Bowel sounds are normal.  Musculoskeletal:       Right ankle: She exhibits normal pulse.       Lumbar back: She exhibits pain and spasm.  Neurological: She is alert and oriented to person, place, and time. She has normal reflexes.  Skin: Skin is warm and dry.  Psychiatric: She has a normal mood and affect. Her behavior is normal. Judgment and thought content normal.      BP 123/69 (BP Location: Left Arm, Patient Position: Sitting, Cuff Size: Normal)   Pulse 86   Temp 98.2 F (36.8 C) (Oral)   Resp 16   Ht  (1.575 m)   Wt 139 lb (63 kg) Hannah Confer018   BMI 25.42 kg/m  Assessment & Plan:  1. Sickle cell disease, type [place], with unspecified crisis (HCC) Sickle cell disease -  Will start folic acid 1 mg daily to prevent aplastic bone marrow crises.  Patient will transition to the day infusion center for extended observation and pain management for 6-7/10 pain related to sickle cell anemia. Patient is not interested in [place] to reduce sickle cell crisis,  discussed at length.   Pulmonary evaluation - Patient denies severe recurrent wheezes, shortness of breath with exercise, or persistent  cough. If these symptoms develop, pulmonary function tests with spirometry will be ordered, and if abnormal, plan on referral to Pulmonology for further evaluation.  Cardiac - Baseline EKG Eye -Eye exam 1 month ago at [facility], with Dr. Carmela Rima  Immunization status - Tdap and Pneumococcal 23 received today. Declined influenza vaccination.   Acute and chronic painful episodes - We agreed on Oxycodone 5 mg today.   We discussed that pt is to receive her Schedule II prescriptions only from [place]. Pt is also aware that the prescription history is available to [place] online through the [facility] CSRS. Controlled substance agreement signed 03/23/2017.  We reminded Hannah Hawkins that all patients receiving Schedule II narcotics must be seen for follow within one month of prescription being requested. We reviewed the terms of our pain agreement, including the need to keep medicines in a safe locked location away from children or pets, and the need to report excess sedation or constipation, measures to avoid constipation, and policies related to early refills and stolen prescriptions. According to the [place] [facility] program, we have reviewed details related to analgesia, adverse effects, aberrant behaviors.  - ibuprofen (ADVIL,MOTRIN) 800 MG tablet; Take 1 tablet (800 mg total) by mouth every 8 (eight) hours as needed for moderate pain.  Dispense: 30 tablet; Refill: 0 - oxyCODONE (ROXICODONE) 5 MG immediate release tablet; Take 1 tablet (5 mg total) by mouth every 6 (six) hours as needed for severe pain.  Dispense: 60 tablet; Refill: 0  2. Elevated liver enzymes - COMPLETE METABOLIC PANEL WITH GFR - Hepatitis panel, acute  3. Screening for HIV (human immunodeficiency virus) - HIV antibody (with reflex)  4. Amenorrhea - hCG, serum, qualitative    RTC: 1 month for sickle cell anemia   Hannah Nations  MSN, FNP-C [facility] [address] [place], [place] [address] [phone]   The patient was given clear instructions to go to ER or return to medical center if symptoms do not improve, worsen or new problems develop. The patient verbalized understanding. Will notify patient with laboratory results.

## 2017-03-23 NOTE — ED Triage Notes (Signed)
Pt states that she is having a sickle cell crisis, pain in both legs and calf. Pain started today.

## 2017-03-24 ENCOUNTER — Emergency Department (HOSPITAL_COMMUNITY): Payer: Medicaid Other

## 2017-03-24 ENCOUNTER — Emergency Department (HOSPITAL_COMMUNITY)
Admission: EM | Admit: 2017-03-24 | Discharge: 2017-03-24 | Disposition: A | Discharge status: Home / Self Care | Payer: Medicaid Other | Attending: Emergency Medicine | Admitting: Emergency Medicine

## 2017-03-24 DIAGNOSIS — R7989 Other specified abnormal findings of blood chemistry: Secondary | ICD-10-CM

## 2017-03-24 DIAGNOSIS — D5700 Hb-SS disease with crisis, unspecified: Secondary | ICD-10-CM

## 2017-03-24 LAB — CBC WITH DIFFERENTIAL/PLATELET
BASOS ABS: 0 10*3/uL (ref 0.0–0.1)
BASOS PCT: 0 %
EOS ABS: 0.3 10*3/uL (ref 0.0–0.7)
Eosinophils Relative: 4 %
HCT: 31.7 % — ABNORMAL LOW (ref 36.0–46.0)
HEMOGLOBIN: 11.7 g/dL — AB (ref 12.0–15.0)
LYMPHS PCT: 39 %
Lymphs Abs: 2.9 10*3/uL (ref 0.7–4.0)
MCH: 26.6 pg (ref 26.0–34.0)
MCHC: 36.9 g/dL — ABNORMAL HIGH (ref 30.0–36.0)
MCV: 72 fL — ABNORMAL LOW (ref 78.0–100.0)
MONO ABS: 0.8 10*3/uL (ref 0.1–1.0)
Monocytes Relative: 10 %
NEUTROS PCT: 47 %
Neutro Abs: 3.5 10*3/uL (ref 1.7–7.7)
PLATELETS: 296 10*3/uL (ref 150–400)
RBC: 4.4 MIL/uL (ref 3.87–5.11)
RDW: 14.3 % (ref 11.5–15.5)
WBC: 7.5 10*3/uL (ref 4.0–10.5)

## 2017-03-24 LAB — COMPREHENSIVE METABOLIC PANEL
ALK PHOS: 42 U/L (ref 38–126)
ALT: 54 U/L (ref 14–54)
ANION GAP: 6 (ref 5–15)
AST: 29 U/L (ref 15–41)
Albumin: 3.8 g/dL (ref 3.5–5.0)
BILIRUBIN TOTAL: 0.7 mg/dL (ref 0.3–1.2)
BUN: 5 mg/dL — ABNORMAL LOW (ref 6–20)
CALCIUM: 8.5 mg/dL — AB (ref 8.9–10.3)
CO2: 25 mmol/L (ref 22–32)
CREATININE: 0.76 mg/dL (ref 0.44–1.00)
Chloride: 103 mmol/L (ref 101–111)
Glucose, Bld: 90 mg/dL (ref 65–99)
Potassium: 3.6 mmol/L (ref 3.5–5.1)
SODIUM: 134 mmol/L — AB (ref 135–145)
TOTAL PROTEIN: 7 g/dL (ref 6.5–8.1)

## 2017-03-24 LAB — HEPATITIS PANEL, ACUTE
HCV AB: NEGATIVE
HEP A IGM: NONREACTIVE
HEP B C IGM: NONREACTIVE
Hepatitis B Surface Ag: NEGATIVE

## 2017-03-24 LAB — RETICULOCYTES
RBC.: 4.4 MIL/uL (ref 3.87–5.11)
RETIC CT PCT: 3.3 % — AB (ref 0.4–3.1)
Retic Count, Absolute: 145.2 10*3/uL (ref 19.0–186.0)

## 2017-03-24 LAB — HCG, SERUM, QUALITATIVE: Preg, Serum: NEGATIVE

## 2017-03-24 LAB — I-STAT TROPONIN, ED: TROPONIN I, POC: 0 ng/mL (ref 0.00–0.08)

## 2017-03-24 LAB — D-DIMER, QUANTITATIVE: D-Dimer, Quant: 1.35 ug/mL-FEU — ABNORMAL HIGH (ref 0.00–0.50)

## 2017-03-24 MED ORDER — IOPAMIDOL (ISOVUE-370) INJECTION 76%
INTRAVENOUS | Status: AC
  Administered 2017-03-24: 100 mL
  Filled 2017-03-24: qty 100

## 2017-03-24 MED ORDER — DIPHENHYDRAMINE HCL 50 MG/ML IJ SOLN
12.5000 mg | Freq: Once | INTRAMUSCULAR | Status: AC
  Administered 2017-03-24: 12.5 mg via INTRAVENOUS
  Filled 2017-03-24: qty 1

## 2017-03-24 MED ORDER — ONDANSETRON HCL 4 MG/2ML IJ SOLN
4.0000 mg | INTRAMUSCULAR | Status: DC | PRN
  Administered 2017-03-24: 4 mg via INTRAVENOUS
  Filled 2017-03-24: qty 2

## 2017-03-24 MED ORDER — HYDROMORPHONE HCL 1 MG/ML IJ SOLN
1.0000 mg | Freq: Once | INTRAMUSCULAR | Status: AC
  Administered 2017-03-24: 1 mg via INTRAVENOUS
  Filled 2017-03-24: qty 1

## 2017-03-24 MED ORDER — KETOROLAC TROMETHAMINE 30 MG/ML IJ SOLN
30.0000 mg | Freq: Once | INTRAMUSCULAR | Status: AC
  Administered 2017-03-24: 30 mg via INTRAVENOUS
  Filled 2017-03-24: qty 1

## 2017-03-24 MED ORDER — DIPHENHYDRAMINE HCL 50 MG/ML IJ SOLN
25.0000 mg | Freq: Once | INTRAMUSCULAR | Status: AC
  Administered 2017-03-24: 25 mg via INTRAVENOUS
  Filled 2017-03-24: qty 1

## 2017-03-24 MED ORDER — SODIUM CHLORIDE 0.45 % IV SOLN
INTRAVENOUS | Status: DC
  Administered 2017-03-24: 01:00:00 via INTRAVENOUS

## 2017-03-24 NOTE — ED Notes (Signed)
Patient transported to X-ray 

## 2017-03-24 NOTE — Discharge Instructions (Signed)
Follow up with your primary care provider for discussion of today's ER visit.  Return to ER for new or worsening symptoms, any additional concerns.

## 2017-03-24 NOTE — ED Provider Notes (Signed)
[facility]-EMERGENCY DEPT Provider Note   CSN: [id number] Arrival date & time: 03/23/17  2311     History   Chief Complaint Chief Complaint  Patient presents with  . Sickle Cell Pain Crisis    HPI Hannah Hawkins is a 31 y.o. female.  The history is provided by the patient and medical records. No language interpreter was used.  Sickle Cell Pain Crisis  Associated symptoms: chest pain   Associated symptoms: no congestion, no cough, no fever, no headaches, no nausea, no shortness of breath and no vomiting    Hannah Hawkins is a 31 y.o. female  with a PMH of sickle cell anemia [place] who presents to the Emergency Department complaining of lower extremity pain which began tonight around 8 PM. Pain is consistent with her typical sickle cell crises. Pain is described as an aching severe pain to both lower legs, right more than left. She also feels as if her right ankle is swollen. She does endorse chest pain which began while in the emergency department. No shortness of breath, cough, congestion, fevers, back pain or abdominal pain. Patient states that she saw her primary care provider this morning who prescribed her pain medication, however she did not get it picked up. She has taken no medication prior to arrival for her symptoms.   Past Medical History:  Diagnosis Date  . Sickle cell anemia [facility])     Patient Active Problem List   Diagnosis Date Noted  . Sickle cell disease, type [place], with unspecified crisis (HCC) 02/18/2017    Past Surgical History:  Procedure Laterality Date  . EYE SURGERY  03/09/2017   left eye    OB History    No data available       Home Medications    Prior to Admission medications   Medication Sig Start Date End Date Taking? Authorizing Provider  ergocalciferol (VITAMIN D2) 50000 units capsule Take 1 capsule (50,000 Units total) by mouth once a week. 02/22/17  Yes Massie Maroon, FNP  erythromycin ophthalmic ointment Place 1 application into the left  eye 2 (two) times daily.   Yes Historical Provider, MD  ibuprofen (ADVIL,MOTRIN) 800 MG tablet Take 1 tablet (800 mg total) by mouth every 8 (eight) hours as needed for moderate pain. 03/23/17  Yes Massie Maroon, FNP  ofloxacin (OCUFLOX) 0.3 % ophthalmic solution Place 1 drop into the left eye 4 (four) times daily.   Yes Historical Provider, MD  omeprazole (PRILOSEC) 20 MG capsule Take 20 mg by mouth.   Yes Historical Provider, MD  oxyCODONE (ROXICODONE) 5 MG immediate release tablet Take 1 tablet (5 mg total) by mouth every 6 (six) hours as needed for severe pain. 03/23/17  Yes Massie Maroon, FNP  prednisoLONE acetate (PRED FORTE) 1 % ophthalmic suspension Place 1 drop into the left eye 4 (four) times daily.   Yes Historical Provider, MD  folic acid (FOLVITE) 1 MG tablet Take 1 tablet (1 mg total) by mouth daily. Patient not taking: Reported on 03/19/2017 02/18/17   Massie Maroon, FNP    Family History No family history on file.  Social History Social History  Substance Use Topics  . Smoking status: Former Games developer  . Smokeless tobacco: Never Used  . Alcohol use Yes     Allergies   Penicillins   Review of Systems Review of Systems  Constitutional: Negative for chills and fever.  HENT: Negative for congestion.   Eyes: Negative for visual disturbance.  Respiratory: Negative for  cough and shortness of breath.   Cardiovascular: Positive for chest pain and leg swelling. Negative for palpitations.  Gastrointestinal: Negative for abdominal pain, nausea and vomiting.  Genitourinary: Negative for dysuria.  Musculoskeletal: Positive for arthralgias and myalgias. Negative for back pain.  Skin: Negative for rash.  Neurological: Negative for headaches.     Physical Exam Updated Vital Signs BP 122/79   Pulse 73   Temp 97.8 F (36.6 C) (Oral)   Resp 14   Ht  (1.6 m)   Wt 63.5 kg   LMP 02/25/2017   SpO2 100%   BMI 24.80 kg/m   Physical Exam  Constitutional: She is  oriented to person, place, and time. She appears well-developed and well-nourished. No distress.  HENT:  Head: Normocephalic and atraumatic.  Cardiovascular: Normal rate, regular rhythm and normal heart sounds.   No murmur heard. Pulmonary/Chest: Effort normal and breath sounds normal. No respiratory distress. She has no wheezes. She has no rales. She exhibits no tenderness.  100% O2 on RA. Speaking in full sentences with no difficulty.  Abdominal: Soft. She exhibits no distension. There is no tenderness.  Musculoskeletal:  Diffuse tenderness to palpation of bilateral lower extremities. Right calf is tender. No leg swelling appreciated. No overlying skin changes.   Neurological: She is alert and oriented to person, place, and time.  Skin: Skin is warm and dry.  Nursing note and vitals reviewed.    ED Treatments / Results  Labs (all labs ordered are listed, but only abnormal results are displayed) Labs Reviewed  COMPREHENSIVE METABOLIC PANEL - Abnormal; Notable for the following:       Result Value   Sodium 134 (*)    BUN 5 (*)    Calcium 8.5 (*)    All other components within normal limits  CBC WITH DIFFERENTIAL/PLATELET - Abnormal; Notable for the following:    Hemoglobin 11.7 (*)    HCT 31.7 (*)    MCV 72.0 (*)    MCHC 36.9 (*)    All other components within normal limits  RETICULOCYTES - Abnormal; Notable for the following:    Retic Ct Pct 3.3 (*)    All other components within normal limits  D-DIMER, QUANTITATIVE (NOT AT [facility]) - Abnormal; Notable for the following:    D-Dimer, Quant 1.35 (*)    All other components within normal limits  I-STAT TROPOININ, ED    EKG  EKG Interpretation None      ED ECG REPORT   Date: 03/24/2017  Rate: 76  Rhythm: normal sinus rhythm  QRS Axis: normal  Intervals: normal  ST/T Wave abnormalities: early repolarization  Conduction Disutrbances:none  Narrative Interpretation: No STEMI.   Attending, Dr. Blinda Leatherwood has personally  reviewed the EKG tracing and agrees with the computerized printout as noted.  Radiology Dg Chest 2 View  Result Date: 03/24/2017 CLINICAL DATA:  Chest pain and myalgias today EXAM: CHEST  2 VIEW COMPARISON:  02/01/2017 FINDINGS: The lungs are clear. The pulmonary vasculature is normal. Heart size is normal. Hilar and mediastinal contours are unremarkable. There is no pleural effusion. IMPRESSION: No active cardiopulmonary disease. Electronically Signed   By: Ellery Plunk M.D.   On: 03/24/2017 02:32   Ct Angio Chest Pe W Or Wo Contrast  Result Date: 03/24/2017 CLINICAL DATA:  Sickle-cell crisis.  Positive D-dimer. EXAM: CT ANGIOGRAPHY CHEST WITH CONTRAST TECHNIQUE: Multidetector CT imaging of the chest was performed using the standard protocol during bolus administration of intravenous contrast. Multiplanar CT image reconstructions and  MIPs were obtained to evaluate the vascular anatomy. CONTRAST:  100 cc Isovue 370 IV COMPARISON:  None. FINDINGS: Cardiovascular: The study is of quality for the evaluation of pulmonary embolism. There are no filling defects in the central, lobar, segmental or subsegmental pulmonary artery branches to suggest acute pulmonary embolism. Great vessels are normal in course and caliber. Normal heart size. No significant pericardial fluid/thickening. Mediastinum/Nodes: No discrete thyroid nodules. Unremarkable esophagus. No pathologically enlarged axillary, mediastinal or hilar lymph nodes. Lungs/Pleura: No pneumothorax. No pleural effusion. Minimal bibasilar dependent atelectasis. Upper abdomen: Unremarkable. Musculoskeletal: No aggressive appearing focal osseous lesions. Sclerosis of the dorsal spine and bony thorax in keeping with sickle-cell. Review of the MIP images confirms the above findings. IMPRESSION: No acute pulmonary embolus. Electronically Signed   By: Tollie Eth M.D.   On: 03/24/2017 03:30    Procedures Procedures (including critical care time)  Medications  Ordered in ED Medications  0.45 % sodium chloride infusion ( Intravenous New Bag/Given 03/24/17 0112)  ondansetron (ZOFRAN) injection 4 mg (4 mg Intravenous Given 03/24/17 0113)  diphenhydrAMINE (BENADRYL) injection 25 mg (25 mg Intravenous Given 03/24/17 0112)  ketorolac (TORADOL) 30 MG/ML injection 30 mg (30 mg Intravenous Given 03/24/17 0113)  HYDROmorphone (DILAUDID) injection 1 mg (1 mg Intravenous Given 03/24/17 0112)  HYDROmorphone (DILAUDID) injection 1 mg (1 mg Intravenous Given 03/24/17 0207)  iopamidol (ISOVUE-370) 76 % injection (100 mLs  Contrast Given 03/24/17 0234)  HYDROmorphone (DILAUDID) injection 1 mg (1 mg Intravenous Given 03/24/17 0347)  diphenhydrAMINE (BENADRYL) injection 12.5 mg (12.5 mg Intravenous Given 03/24/17 0347)     Initial Impression / Assessment and Plan / ED Course  I have reviewed the triage vital signs and the nursing notes.  Pertinent labs & imaging results that were available during my care of the patient were reviewed by me and considered in my medical decision making (see chart for details).    Hannah Hawkins is a 31 y.o. female who presents to ED for Lower extremity pain consistent with her typical sickle cell crises which began tonight. She has taken nothing prior to arrival to try to alleviate her symptoms. She notes this is because her primary care provider just refilled her pain medication and she is taking up in the morning. On exam, patient is afebrile, well-appearing with normal heart rate. No hypoxia. She does have diffuse tenderness to lower extremities and most tenderness to right calf. She does note chest pain which began while she was in the ER today. D-dimer was obtained which was elevated at 1.35, therefore CT angio was performed to rule out PE. CT angiogram negative. Troponin negative. CBC, reticulocytes, CMP reassuring. CXR negative. EKG non-ischemic. Patient received fluids, pain control and nausea control while in ED stay. On reevaluation, pain is  improved and she is requesting discharge to home. Encouraged her to follow up with her primary care provider for discussion of today's ER visit. Return precautions discussed and all questions answered.  Patient discussed with Dr. Blinda Leatherwood who agrees with treatment plan.    Final Clinical Impressions(s) / ED Diagnoses   Final diagnoses:  Positive D dimer  Sickle cell crisis [facility])    New Prescriptions New Prescriptions   No medications on file     [facility] Brailon Don, PA-C 03/24/17 0411    Gilda Crease, MD 03/24/17 [phone]

## 2017-04-14 ENCOUNTER — Ambulatory Visit (INDEPENDENT_AMBULATORY_CARE_PROVIDER_SITE_OTHER): Payer: Medicaid Other | Admitting: Family Medicine

## 2017-04-14 ENCOUNTER — Ambulatory Visit: Payer: Self-pay | Admitting: Family Medicine

## 2017-04-14 ENCOUNTER — Encounter: Payer: Self-pay | Admitting: Family Medicine

## 2017-04-14 VITALS — BP 117/74 | HR 56 | Temp 98.1°F | Resp 14 | Ht 62.0 in | Wt 142.0 lb

## 2017-04-14 DIAGNOSIS — D57219 Sickle-cell/Hb-C disease with crisis, unspecified: Secondary | ICD-10-CM

## 2017-04-14 DIAGNOSIS — R110 Nausea: Secondary | ICD-10-CM | POA: Diagnosis not present

## 2017-04-14 MED ORDER — OXYCODONE HCL 10 MG PO TABS: 10.0000 mg | ORAL_TABLET | Freq: Four times a day (QID) | ORAL | 0 refills | Status: DC | PRN

## 2017-04-14 MED ORDER — IBUPROFEN 800 MG PO TABS: 800.0000 mg | ORAL_TABLET | Freq: Three times a day (TID) | ORAL | 0 refills | Status: DC | PRN

## 2017-04-14 MED ORDER — ONDANSETRON HCL 4 MG PO TABS: 4.0000 mg | ORAL_TABLET | Freq: Three times a day (TID) | ORAL | 2 refills | Status: DC | PRN

## 2017-04-14 NOTE — Progress Notes (Signed)
Subjective:    Patient ID: Hannah Hawkins, female    DOB: [birth year], 31 y.o.   MRN: [id number]  HPI Hannah Hawkins, a 31 year old female with a history of sickle cell anemia, presents for a 1 month follow-up. She is complaining of increased fatigue and pain over the past several weeks. Pain is primarily to lower extremities. She has been taking Oxycodone 5 mg without sustained relief. Current pain intensity is 6/10. She attributes current pain crisis to changes in weather. She says that she has not attempted any OTC analgesics to alleviate current symptoms.  She denies headache, chest pains, shortness of breath, dysuria, nausea, vomiting, or diarrhea.  Past Medical History:  Diagnosis Date  . Sickle cell anemia (HCC)    Immunization History  Administered Date(s) Administered  . Pneumococcal Polysaccharide-23 02/18/2017  . Tdap 02/18/2017   Social History   Social History  . Marital status: Single    Spouse name: N/A  . Number of children: N/A  . Years of education: N/A   Occupational History  . Not on file.   Social History Main Topics  . Smoking status: Former Games developer  . Smokeless tobacco: Never Used  . Alcohol use Yes  . Drug use: No  . Sexual activity: Yes   Other Topics Concern  . Not on file   Social History Narrative  . No narrative on file   Review of Systems  Constitutional: Negative for unexpected weight change.  HENT: Negative.   Eyes: Positive for pain (Recent eye surgery). Negative for visual disturbance.  Respiratory: Negative.   Cardiovascular: Negative for chest pain, palpitations and leg swelling.  Gastrointestinal: Negative.   Endocrine: Negative.  Negative for polydipsia, polyphagia and polyuria.  Genitourinary: Negative.   Musculoskeletal: Positive for arthralgias, back pain and myalgias.  Skin: Negative.   Allergic/Immunologic: Negative.  Negative for immunocompromised state.  Neurological: Negative.   Hematological: Negative.    Psychiatric/Behavioral: Negative.        Objective:   Physical Exam  Constitutional: She is oriented to person, place, and time. She appears well-developed and well-nourished.  HENT:  Head: Normocephalic and atraumatic.  Right Ear: External ear normal.  Left Ear: External ear normal.  Mouth/Throat: Oropharynx is clear and moist.  Eyes: Conjunctivae and EOM are normal. Pupils are equal, round, and reactive to light.  Neck: Normal range of motion. Neck supple.  Cardiovascular: Normal rate, regular rhythm, normal heart sounds and intact distal pulses.   Pulmonary/Chest: Effort normal and breath sounds normal.  Abdominal: Soft. Bowel sounds are normal.  Musculoskeletal:       Right ankle: She exhibits normal pulse.       Lumbar back: She exhibits pain and spasm.  Neurological: She is alert and oriented to person, place, and time. She has normal reflexes.  Skin: Skin is warm and dry.  Psychiatric: She has a normal mood and affect. Her behavior is normal. Judgment and thought content normal.      BP 117/74 (BP Location: Right Arm, Patient Position: Sitting, Cuff Size: Normal)   Pulse (!) 56   Temp 98.1 F (36.7 C) (Oral)   Resp 14   Ht  (1.575 m)   Wt 142 lb (64.4 kg)   LMP 03/23/2017   SpO2 100%   BMI 25.97 kg/m  Assessment & Plan:   1. Sickle cell disease, type [place], with unspecified crisis (HCC) Sickle cell disease - Will transition to the day infusion center for fluids.  Will start folic  acid 1 mg daily to prevent aplastic bone marrow crises.  Patient will transition to the day infusion center for extended observation and pain management for 6-7/10 pain related to sickle cell anemia. Patient is not interested in [place] to reduce sickle cell crisis,  discussed at length.   Pulmonary evaluation - Patient denies severe recurrent wheezes, shortness of breath with exercise, or persistent cough. If these symptoms develop, pulmonary function tests with spirometry will be ordered,  and if abnormal, plan on referral to Pulmonology for further evaluation.  Cardiac - Baseline EKG Eye -Eye exam 1 month ago at [facility], with Dr. Carmela Rima  Immunization status - Vaccination up to date  Acute and chronic painful episodes - We agreed on Oxycodone 10 mg today.   We discussed that pt is to receive her Schedule II prescriptions only from [place]. Pt is also aware that the prescription history is available to [place] online through the [facility] CSRS. Controlled substance agreement signed 03/23/2017.  We reminded Hannah Hawkins that all patients receiving Schedule II narcotics must be seen for follow within one month of prescription being requested. We reviewed the terms of our pain agreement, including the need to keep medicines in a safe locked location away from children or pets, and the need to report excess sedation or constipation, measures to avoid constipation, and policies related to early refills and stolen prescriptions. According to the [facility] program, we have reviewed details related to analgesia, adverse effects, aberrant behaviors.  - Oxycodone HCl 10 MG TABS; Take 1 tablet (10 mg total) by mouth every 6 (six) hours as needed for severe pain.  Dispense: 60 tablet; Refill: 0 - ibuprofen (ADVIL,MOTRIN) 800 MG tablet; Take 1 tablet (800 mg total) by mouth every 8 (eight) hours as needed for moderate pain.  Dispense: 30 tablet; Refill: 0  2. Nausea - ondansetron (ZOFRAN) 4 MG tablet; Take 1 tablet (4 mg total) by mouth every 8 (eight) hours as needed for nausea or vomiting.  Dispense: 30 tablet; Refill: 2   Nolon Nations  MSN, FNP-C [facility] [address] [place], [place] [address] [phone]   The patient was given clear instructions to go to ER or return to medical center if symptoms do not improve, worsen or new problems develop. The patient verbalized understanding.

## 2017-04-15 ENCOUNTER — Other Ambulatory Visit: Payer: Self-pay | Admitting: Family Medicine

## 2017-04-15 ENCOUNTER — Ambulatory Visit (HOSPITAL_COMMUNITY)
Admission: RE | Admit: 2017-04-15 | Discharge: 2017-04-15 | Disposition: A | Discharge status: Home / Self Care | Payer: Medicaid Other | Source: Ambulatory Visit | Attending: Family Medicine | Admitting: Family Medicine

## 2017-04-15 DIAGNOSIS — D571 Sickle-cell disease without crisis: Secondary | ICD-10-CM | POA: Diagnosis not present

## 2017-04-15 MED ORDER — DIPHENHYDRAMINE HCL 25 MG PO CAPS
25.0000 mg | ORAL_CAPSULE | Freq: Once | ORAL | Status: AC
  Administered 2017-04-15: 25 mg via ORAL
  Filled 2017-04-15: qty 1

## 2017-04-15 MED ORDER — DIPHENHYDRAMINE HCL 25 MG PO CAPS
ORAL_CAPSULE | ORAL | Status: AC
  Filled 2017-04-15: qty 1

## 2017-04-15 MED ORDER — DEXTROSE-NACL 5-0.45 % IV SOLN
INTRAVENOUS | Status: DC
  Administered 2017-04-15: 12:00:00 via INTRAVENOUS

## 2017-04-15 MED ORDER — HYDROMORPHONE HCL 4 MG/ML IJ SOLN
1.0000 mg | Freq: Once | INTRAMUSCULAR | Status: AC
  Administered 2017-04-15: 1 mg via INTRAVENOUS
  Filled 2017-04-15: qty 1

## 2017-04-15 MED ORDER — ONDANSETRON HCL 4 MG/2ML IJ SOLN
4.0000 mg | Freq: Once | INTRAMUSCULAR | Status: AC
  Administered 2017-04-15: 4 mg via INTRAVENOUS
  Filled 2017-04-15: qty 2

## 2017-04-15 NOTE — Discharge Instructions (Signed)
Rehydration, Adult Rehydration is the replacement of body fluids and salts and minerals (electrolytes) that are lost during dehydration. Dehydration is when there is not enough fluid or water in the body. This happens when you lose more fluids than you take in. Common causes of dehydration include:  Vomiting.  Diarrhea.  Excessive sweating, such as from heat exposure or exercise.  Taking medicines that cause the body to lose excess fluid (diuretics).  Impaired kidney function.  Not drinking enough fluid.  Certain illnesses or infections.  Certain poorly controlled long-term (chronic) illnesses, such as diabetes, heart disease, and kidney disease. Symptoms of mild dehydration may include thirst, dry lips and mouth, dry skin, and dizziness. Symptoms of severe dehydration may include increased heart rate, confusion, fainting, and not urinating. You can rehydrate by drinking certain fluids or getting fluids through an IV tube, as told by your health care provider. What are the risks? Generally, rehydration is safe. However, one problem that can happen is taking in too much fluid (overhydration). This is rare. If overhydration happens, it can cause an electrolyte imbalance, kidney failure, or a decrease in salt (sodium) levels in the body. How to rehydrate Follow instructions from your health care provider for rehydration. The kind of fluid you should drink and the amount you should drink depend on your condition.  If directed by your health care provider, drink an oral rehydration solution (ORS). This is a drink designed to treat dehydration that is found in pharmacies and retail stores.  Make an ORS by following instructions on the package.  Start by drinking small amounts, about  cup (120 mL) every 5-10 minutes.  Slowly increase how much you drink until you have taken the amount recommended by your health care provider.  Drink enough clear fluids to keep your urine clear or pale  yellow. If you were instructed to drink an ORS, finish the ORS first, then start slowly drinking other clear fluids. Drink fluids such as:  Water. Do not drink only water. Doing that can lead to having too little sodium in your body (hyponatremia).  Ice chips.  Fruit juice that you have added water to (diluted juice).  Low-calorie sports drinks.  If you are severely dehydrated, your health care provider may recommend that you receive fluids through an IV tube in the hospital.  Do not take sodium tablets. Doing that can lead to the condition of having too much sodium in your body (hypernatremia). Eating while you rehydrate Follow instructions from your health care provider about what to eat while you rehydrate. Your health care provider may recommend that you slowly begin eating regular foods in small amounts.  Eat foods that contain a healthy balance of electrolytes, such as bananas, oranges, potatoes, tomatoes, and spinach.  Avoid foods that are greasy or contain a lot of fat or sugar. In some cases, you may get nutrition through a feeding tube that is passed through your nose and into your stomach (nasogastric tube, or NG tube). This may be done if you have uncontrolled vomiting or diarrhea. Beverages to avoid Certain beverages may make dehydration worse. While you rehydrate, avoid:  Alcohol.  Caffeine.  Drinks that contain a lot of sugar. These include:  High-calorie sports drinks.  Fruit juice that is not diluted.  Soda. Check nutrition labels to see how much sugar or caffeine a beverage contains. Signs of dehydration recovery You may be recovering from dehydration if:  You are urinating more often than before you started rehydrating.  Your   urine is clear or pale yellow.  Your energy level improves.  You vomit less frequently.  You have diarrhea less frequently.  Your appetite improves or returns to normal.  You feel less dizzy or less light-headed.  Your skin  tone and color start to look more normal. Contact a health care provider if:  You continue to have symptoms of mild dehydration, such as:  Thirst.  Dry lips.  Slightly dry mouth.  Dry, warm skin.  Dizziness.  You continue to vomit or have diarrhea. Get help right away if:  You have symptoms of dehydration that get worse.  You feel:  Confused.  Weak.  Like you are going to faint.  You have not urinated in 6-8 hours.  You have very dark urine.  You have trouble breathing.  Your heart rate while sitting still is over 100 beats a minute.  You cannot drink fluids without vomiting.  You have vomiting or diarrhea that:  Gets worse.  Does not go away.  You have a fever. This information is not intended to replace advice given to you by your health care provider. Make sure you discuss any questions you have with your health care provider. Document Released: 02/28/2012 Document Revised: 06/25/2016 Document Reviewed: 01/30/2016 Elsevier Interactive Patient Education  2017 [employer]  

## 2017-04-15 NOTE — Progress Notes (Signed)
[facility]  Procedure Note  Hannah Hawkins YQM:[id number] DOB: Nov 20, 1986 DOA: 04/15/2017   PCP: Massie Maroon, FNP   Associated Diagnosis: D57.1  Procedure Note: IV started, Iv fluids given per order.  Patient C/O pain 8/10.  Orders recvd for hydromorphone.  Pain improved to 6/10 on pain scale.     Condition During Procedure:  Patient slept during infusion, no acute distress   Condition at Discharge:  Patient stable, family coming to transport home.   Lanae Boast, RN  [facility]

## 2017-04-17 NOTE — Patient Instructions (Addendum)
Sickle Cell Anemia, Adult °Sickle cell anemia is a condition in which red blood cells have an abnormal “sickle” shape. This abnormal shape shortens the cells’ life span, which results in a lower than normal concentration of red blood cells in the blood. The sickle shape also causes the cells to clump together and block free blood flow through the blood vessels. As a result, the tissues and organs of the body do not receive enough oxygen. Sickle cell anemia causes organ damage and pain and increases the risk of infection. °What are the causes? °Sickle cell anemia is a genetic disorder. Those who receive two copies of the gene have the condition, and those who receive one copy have the trait. °What increases the risk? °The sickle cell gene is most common in people whose families originated in [place]. Other areas of the globe where sickle cell trait occurs include the Mediterranean, [place] and [place], the [place], and the [place]. °What are the signs or symptoms? °· Pain, especially in the extremities, back, chest, or abdomen (common). The pain may start suddenly or may develop following an illness, especially if there is dehydration. Pain can also occur due to overexertion or exposure to extreme temperature changes. °· Frequent severe bacterial infections, especially certain types of pneumonia and meningitis. °· Pain and swelling in the hands and feet. °· Decreased activity. °· Loss of appetite. °· Change in behavior. °· Headaches. °· Seizures. °· Shortness of breath or difficulty breathing. °· Vision changes. °· Skin ulcers. °Those with the trait may not have symptoms or they may have mild symptoms. °How is this diagnosed? °Sickle cell anemia is diagnosed with blood tests that demonstrate the genetic trait. It is often diagnosed during the newborn period, due to mandatory testing nationwide. A variety of blood tests, X-rays, CT scans, MRI scans, ultrasounds, and lung function tests may also be done to  monitor the condition. °How is this treated? °Sickle cell anemia may be treated with: °· Medicines. You may be given pain medicines, antibiotic medicines (to treat and prevent infections) or medicines to increase the production of certain types of hemoglobin. °· Fluids. °· Oxygen. °· Blood transfusions. ° °Follow these instructions at home: °· Drink enough fluid to keep your urine clear or pale yellow. Increase your fluid intake in hot weather and during exercise. °· Do not smoke. Smoking lowers oxygen levels in the blood. °· Only take over-the-counter or prescription medicines for pain, fever, or discomfort as directed by your health care provider. °· Take antibiotics as directed by your health care provider. Make sure you finish them it even if you start to feel better. °· Take supplements as directed by your health care provider. °· Consider wearing a medical alert bracelet. This tells anyone caring for you in an emergency of your condition. °· When traveling, keep your medical information, health care provider's names, and the medicines you take with you at all times. °· If you develop a fever, do not take medicines to reduce the fever right away. This could cover up a problem that is developing. Notify your health care provider. °· Keep all follow-up appointments with your health care provider. Sickle cell anemia requires regular medical care. °Contact a health care provider if: °You have a fever. °Get help right away if: °· You feel dizzy or faint. °· You have new abdominal pain, especially on the left side near the stomach area. °· You develop a persistent, often uncomfortable and painful penile erection (priapism). If this is not   treated immediately it will lead to impotence. °· You have numbness your arms or legs or you have a hard time moving them. °· You have a hard time with speech. °· You have a fever or persistent symptoms for more than 2-3 days. °· You have a fever and your symptoms suddenly get  worse. °· You have signs or symptoms of infection. These include: °? Chills. °? Abnormal tiredness (lethargy). °? Irritability. °? Poor eating. °? Vomiting. °· You develop pain that is not helped with medicine. °· You develop shortness of breath. °· You have pain in your chest. °· You are coughing up pus-like or bloody sputum. °· You develop a stiff neck. °· Your feet or hands swell or have pain. °· Your abdomen appears bloated. °· You develop joint pain. °This information is not intended to replace advice given to you by your health care provider. Make sure you discuss any questions you have with your health care provider. °Document Released: 03/16/2006 Document Revised: 06/25/2016 Document Reviewed: 07/18/2013 °Elsevier Interactive Patient Education © 2017 [employer] ° °

## 2017-04-27 ENCOUNTER — Ambulatory Visit: Payer: Medicaid Other | Admitting: Family Medicine

## 2017-04-28 ENCOUNTER — Ambulatory Visit: Payer: Medicaid Other | Admitting: Family Medicine

## 2017-04-29 ENCOUNTER — Telehealth: Payer: Self-pay

## 2017-04-29 ENCOUNTER — Encounter (HOSPITAL_COMMUNITY): Payer: Self-pay | Admitting: *Deleted

## 2017-04-29 DIAGNOSIS — Z79899 Other long term (current) drug therapy: Secondary | ICD-10-CM | POA: Insufficient documentation

## 2017-04-29 DIAGNOSIS — Z87891 Personal history of nicotine dependence: Secondary | ICD-10-CM | POA: Insufficient documentation

## 2017-04-29 DIAGNOSIS — D57219 Sickle-cell/Hb-C disease with crisis, unspecified: Secondary | ICD-10-CM | POA: Insufficient documentation

## 2017-04-29 NOTE — ED Triage Notes (Signed)
The pt is c/o bi-lateral leg pain from sickle cell  She is also c/o a headache  lmp  Monday  No porta-cath or hickman

## 2017-04-30 ENCOUNTER — Emergency Department (HOSPITAL_COMMUNITY)
Admission: EM | Admit: 2017-04-30 | Discharge: 2017-04-30 | Disposition: A | Discharge status: Home / Self Care | Payer: Medicaid Other | Attending: Emergency Medicine | Admitting: Emergency Medicine

## 2017-04-30 DIAGNOSIS — D5700 Hb-SS disease with crisis, unspecified: Secondary | ICD-10-CM

## 2017-04-30 LAB — CBC WITH DIFFERENTIAL/PLATELET
BASOS PCT: 1 %
Basophils Absolute: 0.1 10*3/uL (ref 0.0–0.1)
EOS PCT: 2 %
Eosinophils Absolute: 0.1 10*3/uL (ref 0.0–0.7)
HEMATOCRIT: 32.3 % — AB (ref 36.0–46.0)
Hemoglobin: 11.7 g/dL — ABNORMAL LOW (ref 12.0–15.0)
LYMPHS ABS: 3 10*3/uL (ref 0.7–4.0)
Lymphocytes Relative: 49 %
MCH: 26.1 pg (ref 26.0–34.0)
MCHC: 36.2 g/dL — ABNORMAL HIGH (ref 30.0–36.0)
MCV: 71.9 fL — AB (ref 78.0–100.0)
MONO ABS: 0.4 10*3/uL (ref 0.1–1.0)
MONOS PCT: 7 %
NEUTROS ABS: 2.5 10*3/uL (ref 1.7–7.7)
Neutrophils Relative %: 41 %
PLATELETS: 324 10*3/uL (ref 150–400)
RBC: 4.49 MIL/uL (ref 3.87–5.11)
RDW: 14.4 % (ref 11.5–15.5)
WBC: 6.1 10*3/uL (ref 4.0–10.5)

## 2017-04-30 LAB — COMPREHENSIVE METABOLIC PANEL
ALBUMIN: 3.9 g/dL (ref 3.5–5.0)
ALT: 13 U/L — AB (ref 14–54)
AST: 21 U/L (ref 15–41)
Alkaline Phosphatase: 43 U/L (ref 38–126)
Anion gap: 7 (ref 5–15)
BUN: 8 mg/dL (ref 6–20)
CHLORIDE: 106 mmol/L (ref 101–111)
CO2: 23 mmol/L (ref 22–32)
CREATININE: 0.71 mg/dL (ref 0.44–1.00)
Calcium: 8.8 mg/dL — ABNORMAL LOW (ref 8.9–10.3)
GFR calc Af Amer: >60 mL/min (ref >60)
GLUCOSE: 105 mg/dL — AB (ref 65–99)
POTASSIUM: 4 mmol/L (ref 3.5–5.1)
Sodium: 136 mmol/L (ref 135–145)
Total Bilirubin: 0.8 mg/dL (ref 0.3–1.2)
Total Protein: 6.7 g/dL (ref 6.5–8.1)

## 2017-04-30 LAB — RETICULOCYTES
RBC.: 4.49 MIL/uL (ref 3.87–5.11)
RETIC COUNT ABSOLUTE: 130.2 10*3/uL (ref 19.0–186.0)
Retic Ct Pct: 2.9 % (ref 0.4–3.1)

## 2017-04-30 MED ORDER — KETOROLAC TROMETHAMINE 30 MG/ML IJ SOLN
30.0000 mg | Freq: Once | INTRAMUSCULAR | Status: AC
  Administered 2017-04-30: 30 mg via INTRAVENOUS
  Filled 2017-04-30: qty 1

## 2017-04-30 MED ORDER — HYDROMORPHONE HCL 1 MG/ML IJ SOLN
1.5000 mg | Freq: Once | INTRAMUSCULAR | Status: AC
  Administered 2017-04-30: 1.5 mg via INTRAVENOUS
  Filled 2017-04-30: qty 2

## 2017-04-30 MED ORDER — ONDANSETRON HCL 4 MG/2ML IJ SOLN
4.0000 mg | Freq: Once | INTRAMUSCULAR | Status: AC
  Administered 2017-04-30: 4 mg via INTRAVENOUS
  Filled 2017-04-30: qty 2

## 2017-04-30 MED ORDER — DEXTROSE 5 % IV BOLUS
1000.0000 mL | Freq: Once | INTRAVENOUS | Status: AC
  Administered 2017-04-30: 1000 mL via INTRAVENOUS

## 2017-04-30 MED ORDER — DIPHENHYDRAMINE HCL 25 MG PO CAPS
50.0000 mg | ORAL_CAPSULE | Freq: Once | ORAL | Status: AC
  Administered 2017-04-30: 50 mg via ORAL
  Filled 2017-04-30: qty 2

## 2017-04-30 MED ORDER — HYDROMORPHONE HCL 1 MG/ML IJ SOLN
1.0000 mg | Freq: Once | INTRAMUSCULAR | Status: AC
  Administered 2017-04-30: 1 mg via INTRAVENOUS
  Filled 2017-04-30: qty 1

## 2017-04-30 MED ORDER — ONDANSETRON 4 MG PO TBDP
4.0000 mg | ORAL_TABLET | Freq: Once | ORAL | Status: AC
  Administered 2017-04-30: 4 mg via ORAL
  Filled 2017-04-30: qty 1

## 2017-04-30 MED ORDER — KETOROLAC TROMETHAMINE 30 MG/ML IJ SOLN: 15.0000 mg | Freq: Once | INTRAMUSCULAR | Status: DC

## 2017-04-30 MED ORDER — ONDANSETRON HCL 4 MG/2ML IJ SOLN
4.0000 mg | Freq: Once | INTRAMUSCULAR | Status: DC
  Filled 2017-04-30: qty 2

## 2017-04-30 NOTE — ED Provider Notes (Signed)
[facility]-EMERGENCY DEPT Provider Note   CSN: [id number] Arrival date & time: 04/29/17  2341  By signing my name below, I, Modena Jansky, attest that this documentation has been prepared under the direction and in the presence of Melene Plan, DO. Electronically Signed: Modena Jansky, Scribe. 04/30/2017. 1:28 AM.  History   Chief Complaint Chief Complaint  Patient presents with  . Leg Pain  . Sickle Cell Pain Crisis  . Headache   The history is provided by the patient. No language interpreter was used.  Leg Pain   This is a new problem. The current episode started more than 2 days ago. The problem occurs constantly. The problem has not changed since onset.The pain is moderate.  Sickle Cell Pain Crisis  Location:  Lower extremity Severity:  Moderate Sickle cell genotype:  SS Associated symptoms: headaches   Associated symptoms: no chest pain, no congestion, no fever, no nausea, no shortness of breath, no vomiting and no wheezing   Headache   Pertinent negatives include no fever, no palpitations, no shortness of breath, no nausea and no vomiting.   HPI Comments: Hannah Hawkins is a 31 y.o. female with a PMHx of sickle cell anemia who presents to the Emergency Department complaining of constant moderate BLE pain that started a few days ago. She was seen in the 03/24/17 for a sickle cell pain crisis and states she is currently having another episode. No modifying factors. She reports associated headache and appetite loss. Denies any chest pain or other complaints at this time.    PCP: Massie Maroon, FNP  Past Medical History:  Diagnosis Date  . Sickle cell anemia [facility])     Patient Active Problem List   Diagnosis Date Noted  . Sickle cell disease, type [place], with unspecified crisis (HCC) 02/18/2017    Past Surgical History:  Procedure Laterality Date  . EYE SURGERY  03/09/2017   left eye    OB History    No data available       Home Medications    Prior to Admission  medications   Medication Sig Start Date End Date Taking? Authorizing Provider  ergocalciferol (VITAMIN D2) 50000 units capsule Take 1 capsule (50,000 Units total) by mouth once a week. 02/22/17   Massie Maroon, FNP  erythromycin ophthalmic ointment Place 1 application into the left eye 2 (two) times daily.    [provider]  folic acid (FOLVITE) 1 MG tablet Take 1 tablet (1 mg total) by mouth daily. Patient not taking: Reported on 03/19/2017 02/18/17   Massie Maroon, FNP  ibuprofen (ADVIL,MOTRIN) 800 MG tablet Take 1 tablet (800 mg total) by mouth every 8 (eight) hours as needed for moderate pain. 04/14/17   Massie Maroon, FNP  ofloxacin (OCUFLOX) 0.3 % ophthalmic solution Place 1 drop into the left eye 4 (four) times daily.    [provider]  omeprazole (PRILOSEC) 20 MG capsule Take 20 mg by mouth.    [provider]  ondansetron (ZOFRAN) 4 MG tablet Take 1 tablet (4 mg total) by mouth every 8 (eight) hours as needed for nausea or vomiting. 04/14/17   Massie Maroon, FNP  Oxycodone HCl 10 MG TABS Take 1 tablet (10 mg total) by mouth every 6 (six) hours as needed for severe pain. 04/14/17   Massie Maroon, FNP  prednisoLONE acetate (PRED FORTE) 1 % ophthalmic suspension Place 1 drop into the left eye 4 (four) times daily.    [provider]  Family History No family history on file.  Social History Social History  Substance Use Topics  . Smoking status: Former Games developer  . Smokeless tobacco: Never Used  . Alcohol use Yes     Allergies   Penicillins   Review of Systems Review of Systems  Constitutional: Positive for appetite change. Negative for chills and fever.  HENT: Negative for congestion and rhinorrhea.   Eyes: Negative for redness and visual disturbance.  Respiratory: Negative for shortness of breath and wheezing.   Cardiovascular: Negative for chest pain and palpitations.  Gastrointestinal: Negative for nausea and vomiting.    Genitourinary: Negative for dysuria and urgency.  Musculoskeletal: Positive for myalgias (BLE). Negative for arthralgias.  Skin: Negative for pallor and wound.  Neurological: Positive for headaches. Negative for dizziness.     Physical Exam Updated Vital Signs BP (!) 112/59 (BP Location: Right Arm)   Pulse (!) 115   Temp 97.7 F (36.5 C) (Oral)   Resp 14   LMP 04/25/2017   SpO2 97%   Physical Exam  Constitutional: She is oriented to person, place, and time. She appears well-developed and well-nourished. No distress.  HENT:  Head: Normocephalic and atraumatic.  Eyes: EOM are normal. Pupils are equal, round, and reactive to light.  Neck: Normal range of motion. Neck supple.  Cardiovascular: Normal rate and regular rhythm.  Exam reveals no gallop and no friction rub.   No murmur heard. Pulmonary/Chest: Effort normal. She has no wheezes. She has no rales.  Abdominal: Soft. She exhibits no distension. There is no tenderness.  Musculoskeletal: She exhibits no edema or tenderness.  Neurological: She is alert and oriented to person, place, and time.  Skin: Skin is warm and dry. She is not diaphoretic.  Psychiatric: She has a normal mood and affect. Her behavior is normal.  Nursing note and vitals reviewed.    ED Treatments / Results  DIAGNOSTIC STUDIES: Oxygen Saturation is 97% on RA, normal by my interpretation.    COORDINATION OF CARE: 6:43 AM- Pt advised of plan for treatment and pt agrees.  Labs (all labs ordered are listed, but only abnormal results are displayed) Labs Reviewed  COMPREHENSIVE METABOLIC PANEL - Abnormal; Notable for the following:       Result Value   Glucose, Bld 105 (*)    Calcium 8.8 (*)    ALT 13 (*)    All other components within normal limits  CBC WITH DIFFERENTIAL/PLATELET - Abnormal; Notable for the following:    Hemoglobin 11.7 (*)    HCT 32.3 (*)    MCV 71.9 (*)    MCHC 36.2 (*)    All other components within normal limits   RETICULOCYTES    EKG  EKG Interpretation None       Radiology No results found.  Procedures Procedures (including critical care time)  Medications Ordered in ED Medications  dextrose 5 % bolus 1,000 mL (0 mLs Intravenous Stopped 04/30/17 0610)  ondansetron (ZOFRAN) injection 4 mg (4 mg Intravenous Given 04/30/17 0158)  HYDROmorphone (DILAUDID) injection 1 mg (1 mg Intravenous Given 04/30/17 0158)  ketorolac (TORADOL) 30 MG/ML injection 30 mg (30 mg Intravenous Given 04/30/17 0157)  diphenhydrAMINE (BENADRYL) capsule 50 mg (50 mg Oral Given 04/30/17 0203)  HYDROmorphone (DILAUDID) injection 1.5 mg (1.5 mg Intravenous Given 04/30/17 0255)  HYDROmorphone (DILAUDID) injection 1.5 mg (1.5 mg Intravenous Given 04/30/17 0408)  ondansetron (ZOFRAN-ODT) disintegrating tablet 4 mg (4 mg Oral Given 04/30/17 0615)     Initial Impression / Assessment and  Plan / ED Course  I have reviewed the triage vital signs and the nursing notes.  Pertinent labs & imaging results that were available during my care of the patient were reviewed by me and considered in my medical decision making (see chart for details).     31 yo F with a cc of sickle cell pain crisis.  Labs stable, patient given 3 doses of dilaudid with improvement, no new areas of pain, afebrile.  D/c home.   6:43 AM:  I have discussed the diagnosis/risks/treatment options with the patient and believe the pt to be eligible for discharge home to follow-up with PCP. We also discussed returning to the ED immediately if new or worsening sx occur. We discussed the sx which are most concerning (e.g., sudden worsening pain, fever, inability to tolerate by mouth) that necessitate immediate return. Medications administered to the patient during their visit and any new prescriptions provided to the patient are listed below.  Medications given during this visit Medications  dextrose 5 % bolus 1,000 mL (0 mLs Intravenous Stopped 04/30/17 0610)   ondansetron (ZOFRAN) injection 4 mg (4 mg Intravenous Given 04/30/17 0158)  HYDROmorphone (DILAUDID) injection 1 mg (1 mg Intravenous Given 04/30/17 0158)  ketorolac (TORADOL) 30 MG/ML injection 30 mg (30 mg Intravenous Given 04/30/17 0157)  diphenhydrAMINE (BENADRYL) capsule 50 mg (50 mg Oral Given 04/30/17 0203)  HYDROmorphone (DILAUDID) injection 1.5 mg (1.5 mg Intravenous Given 04/30/17 0255)  HYDROmorphone (DILAUDID) injection 1.5 mg (1.5 mg Intravenous Given 04/30/17 0408)  ondansetron (ZOFRAN-ODT) disintegrating tablet 4 mg (4 mg Oral Given 04/30/17 0615)     The patient appears reasonably screen and/or stabilized for discharge and I doubt any other medical condition or other [facility] requiring further screening, evaluation, or treatment in the ED at this time prior to discharge.    Final Clinical Impressions(s) / ED Diagnoses   Final diagnoses:  Sickle cell crisis [facility](HCC)    New Prescriptions Discharge Medication List as of 04/30/2017  5:36 AM     I personally performed the services described in this documentation, which was scribed in my presence. The recorded information has been reviewed and is accurate.      Melene PlanFloyd, Freddi Schrager, DO 04/30/17 [phone]

## 2017-05-03 ENCOUNTER — Other Ambulatory Visit: Payer: Self-pay | Admitting: Internal Medicine

## 2017-05-03 ENCOUNTER — Ambulatory Visit (INDEPENDENT_AMBULATORY_CARE_PROVIDER_SITE_OTHER): Payer: Medicaid Other | Admitting: Family Medicine

## 2017-05-03 ENCOUNTER — Telehealth: Payer: Self-pay

## 2017-05-03 ENCOUNTER — Encounter: Payer: Self-pay | Admitting: Family Medicine

## 2017-05-03 ENCOUNTER — Other Ambulatory Visit (HOSPITAL_COMMUNITY)
Admission: RE | Admit: 2017-05-03 | Discharge: 2017-05-03 | Disposition: A | Discharge status: Home / Self Care | Payer: Medicaid Other | Source: Ambulatory Visit | Attending: Family Medicine | Admitting: Family Medicine

## 2017-05-03 VITALS — BP 106/72 | HR 59 | Temp 98.1°F | Resp 14 | Ht 62.0 in | Wt 135.0 lb

## 2017-05-03 DIAGNOSIS — Z01419 Encounter for gynecological examination (general) (routine) without abnormal findings: Secondary | ICD-10-CM

## 2017-05-03 DIAGNOSIS — Z1231 Encounter for screening mammogram for malignant neoplasm of breast: Secondary | ICD-10-CM

## 2017-05-03 DIAGNOSIS — D57219 Sickle-cell/Hb-C disease with crisis, unspecified: Secondary | ICD-10-CM

## 2017-05-03 DIAGNOSIS — Z1239 Encounter for other screening for malignant neoplasm of breast: Secondary | ICD-10-CM

## 2017-05-03 MED ORDER — OXYCODONE HCL 10 MG PO TABS: 10.0000 mg | ORAL_TABLET | Freq: Four times a day (QID) | ORAL | 0 refills | Status: DC | PRN

## 2017-05-03 NOTE — Progress Notes (Signed)
Subjective:    Patient ID: Hannah Hawkins, female    DOB: May 23, 1986, 31 y.o.   MRN: [id number]  [name], a 31 year old female with a history of sickle cell anemia, HbSC presents for a routine gynecological exam. She does not do monthly self breast exams. She is sexually active with 1 partner. She is not currently on birth control.  She reports 1 vaginal birth.     Gynecologic Exam  The patient's pertinent negatives include no genital itching, genital lesions, genital odor, genital rash, missed menses, pelvic pain or vaginal discharge. The patient is experiencing no pain. Pertinent negatives include no abdominal pain, anorexia, back pain, chills, constipation, diarrhea, discolored urine, dysuria, fever, flank pain, frequency, headaches, hematuria, joint pain, joint swelling, nausea, painful intercourse, rash, sore throat, urgency or vomiting. She is sexually active. No, her partner does not have an STD. She uses nothing for contraception. There is no history of a Cesarean section, menorrhagia, metrorrhagia, PID or an STD.   Past Medical History:  Diagnosis Date  . Sickle cell anemia (HCC)    Social History   Social History  . Marital status: Single    Spouse name: N/A  . Number of children: N/A  . Years of education: N/A   Occupational History  . Not on file.   Social History Main Topics  . Smoking status: Former Games developermoker  . Smokeless tobacco: Never Used  . Alcohol use Yes  . Drug use: No  . Sexual activity: Yes   Other Topics Concern  . Not on file   Social History Narrative  . No narrative on file   Immunization History  Administered Date(s) Administered  . Pneumococcal Polysaccharide-23 02/18/2017  . Tdap 02/18/2017       Review of Systems  Constitutional: Negative for chills and fever.  HENT: Negative for sore throat.   Gastrointestinal: Negative for abdominal pain, anorexia, constipation, diarrhea, nausea and vomiting.  Genitourinary: Negative  for dysuria, flank pain, frequency, hematuria, menorrhagia, missed menses, pelvic pain, urgency and vaginal discharge.  Musculoskeletal: Negative for back pain and joint pain.  Skin: Negative for rash.  Neurological: Negative for headaches.       Objective:   Physical Exam  Constitutional: She appears well-developed and well-nourished.  Cardiovascular: Normal rate, regular rhythm, normal heart sounds and intact distal pulses.   Pulmonary/Chest: Effort normal and breath sounds normal. Right breast exhibits no inverted nipple, no mass and no nipple discharge. Left breast exhibits no inverted nipple, no mass and no nipple discharge. Breasts are symmetrical.  Abdominal: Soft. Bowel sounds are normal.  Genitourinary: There is lesion on the right labia. There is no rash or tenderness on the right labia. There is no rash, tenderness or lesion on the left labia. No signs of injury around the vagina. Vaginal discharge (clear) found.  Genitourinary Comments: Genital piercing         BP 106/72 (BP Location: Left Arm, Patient Position: Sitting, Cuff Size: Normal)   Pulse (!) 59   Temp 98.1 F (36.7 C) (Oral)   Resp 14   Ht 5\' 2"  (1.575 m)   Wt 135 lb (61.2 kg)   LMP 04/25/2017   SpO2 100%   BMI 24.69 kg/m    1. Cervical smear, as part of routine gynecological examination - Cytology - PAP [place] - POCT urinalysis dip (device) The current method of family planning is withdrawl.  2. Screening breast examination Discussed monthly self breast examinations at length. Patient expressed understanding.  Breasts: breasts appear normal, no suspicious masses, no skin or nipple changes or axillary nodes  RTC: Follow up for sickle cell anemia in 1 month  Kamdyn Colborn Rennis Petty  MSN, FNP-C [facility] Patient [facility] [address] [place], [place] [address] [phone]

## 2017-05-03 NOTE — Patient Instructions (Addendum)
Pap Test Why am I having this test? A pap test is sometimes called a pap smear. It is a screening test that is used to check for signs of cancer of the vagina, cervix, and uterus. The test can also identify the presence of infection or precancerous changes. Your health care provider will likely recommend you have this test done on a regular basis. This test may be done:  Every 3 years, starting at age 33.  Every 5 years, in combination with testing for the presence of human papillomavirus (HPV).  More or less often depending on other medical conditions. What kind of sample is taken? Using a small cotton swab, plastic spatula, or brush, your health care provider will collect a sample of cells from the surface of your cervix. Your cervix is the opening to your uterus, also called a womb. Secretions from the cervix and vagina may also be collected. How do I prepare for this test?  Be aware of where you are in your menstrual cycle. You may be asked to reschedule the test if you are menstruating on the day of the test.  You may need to reschedule if you have a known vaginal infection on the day of the test.  You may be asked to avoid douching or taking a bath the day before or the day of the test.  Some medicines can cause abnormal test results, such as digitalis and tetracycline. Talk with your health care provider before your test if you take one of these medicines. What do the results mean? Abnormal test results may indicate a number of health conditions. These may include:  Cancer. Although pap test results cannot be used to diagnose cancer of the cervix, vagina, or uterus, they may suggest the possibility of cancer. Further tests would be required to determine if cancer is present.  Sexually transmitted disease.  Fungal infection.  Parasite infection.  Herpes infection.  A condition causing or contributing to infertility. It is your responsibility to obtain your test results. Ask  the lab or department performing the test when and how you will get your results. Contact your health care provider to discuss any questions you have about your results. Talk with your health care provider to discuss your results, treatment options, and if necessary, the need for more tests. Talk with your health care provider if you have any questions about your results. This information is not intended to replace advice given to you by your health care provider. Make sure you discuss any questions you have with your health care provider. Document Released: 02/26/2003 Document Revised: 08/11/2016 Document Reviewed: 04/29/2014 Elsevier Interactive Patient Education  2017 [place] 18-39 Years, Female Preventive care refers to lifestyle choices and visits with your health care provider that can promote health and wellness. What does preventive care include?  A yearly physical exam. This is also called an annual well check.  Dental exams once or twice a year.  Routine eye exams. Ask your health care provider how often you should have your eyes checked.  Personal lifestyle choices, including:  Daily care of your teeth and gums.  Regular physical activity.  Eating a healthy diet.  Avoiding tobacco and drug use.  Limiting alcohol use.  Practicing safe sex.  Taking vitamin and mineral supplements as recommended by your health care provider. What happens during an annual well check? The services and screenings done by your health care provider during your annual well check will depend on your age,  overall health, lifestyle risk factors, and family history of disease. Counseling  Your health care provider may ask you questions about your:  Alcohol use.  Tobacco use.  Drug use.  Emotional well-being.  Home and relationship well-being.  Sexual activity.  Eating habits.  Work and work Statistician.  Method of birth control.  Menstrual  cycle.  Pregnancy history. Screening  You may have the following tests or measurements:  Height, weight, and BMI.  Diabetes screening. This is done by checking your blood sugar (glucose) after you have not eaten for a while (fasting).  Blood pressure.  Lipid and cholesterol levels. These may be checked every 5 years starting at age 68.  Skin check.  Hepatitis C blood test.  Hepatitis B blood test.  Sexually transmitted disease (STD) testing.  BRCA-related cancer screening. This may be done if you have a family history of breast, ovarian, tubal, or peritoneal cancers.  Pelvic exam and Pap test. This may be done every 3 years starting at age 69. Starting at age 3, this may be done every 5 years if you have a Pap test in combination with an HPV test. Discuss your test results, treatment options, and if necessary, the need for more tests with your health care provider. Vaccines  Your health care provider may recommend certain vaccines, such as:  Influenza vaccine. This is recommended every year.  Tetanus, diphtheria, and acellular pertussis (Tdap, Td) vaccine. You may need a Td booster every 10 years.  Varicella vaccine. You may need this if you have not been vaccinated.  HPV vaccine. If you are 75 or younger, you may need three doses over 6 months.  Measles, mumps, and rubella (MMR) vaccine. You may need at least one dose of MMR. You may also need a second dose.  Pneumococcal 13-valent conjugate (PCV13) vaccine. You may need this if you have certain conditions and were not previously vaccinated.  Pneumococcal polysaccharide (PPSV23) vaccine. You may need one or two doses if you smoke cigarettes or if you have certain conditions.  Meningococcal vaccine. One dose is recommended if you are age 5-21 years and a first-year college student living in a residence hall, or if you have one of several medical conditions. You may also need additional booster doses.  Hepatitis A  vaccine. You may need this if you have certain conditions or if you travel or work in places where you may be exposed to hepatitis A.  Hepatitis B vaccine. You may need this if you have certain conditions or if you travel or work in places where you may be exposed to hepatitis B.  Haemophilus influenzae type b (Hib) vaccine. You may need this if you have certain risk factors. Talk to your health care provider about which screenings and vaccines you need and how often you need them. This information is not intended to replace advice given to you by your health care provider. Make sure you discuss any questions you have with your health care provider. Document Released: 02/01/2002 Document Revised: 08/25/2016 Document Reviewed: 10/07/2015 Elsevier Interactive Patient Education  2017 [employer].

## 2017-05-04 ENCOUNTER — Other Ambulatory Visit: Payer: Self-pay | Admitting: Family Medicine

## 2017-05-04 DIAGNOSIS — D57219 Sickle-cell/Hb-C disease with crisis, unspecified: Secondary | ICD-10-CM

## 2017-05-04 LAB — POCT URINALYSIS DIP (DEVICE)
BILIRUBIN URINE: NEGATIVE
Glucose, UA: NEGATIVE mg/dL
HGB URINE DIPSTICK: NEGATIVE
Ketones, ur: NEGATIVE mg/dL
LEUKOCYTES UA: NEGATIVE
Nitrite: NEGATIVE
Protein, ur: NEGATIVE mg/dL
SPECIFIC GRAVITY, URINE: 1.02 (ref 1.005–1.030)
Urobilinogen, UA: 0.2 mg/dL (ref 0.0–1.0)
pH: 6 (ref 5.0–8.0)

## 2017-05-04 MED ORDER — OXYCODONE HCL 10 MG PO TABS: 10.0000 mg | ORAL_TABLET | Freq: Four times a day (QID) | ORAL | 0 refills | Status: DC | PRN

## 2017-05-04 NOTE — Progress Notes (Signed)
Reviewed [place] Substance Reporting system prior to prescribing opiate medications. No inconsistencies noted.    Meds ordered this encounter  Medications  . Oxycodone HCl 10 MG TABS    Sig: Take 1 tablet (10 mg total) by mouth every 6 (six) hours as needed.    Dispense:  60 tablet    Refill:  0    Order Specific Question:   Supervising Provider    Answer:   JEGEDE, [name] [[id number]]    Rhyann Berton Moore Lashae Wollenberg  MSN, FNP-C [facility] [address]  [place],  [address] [phone]  

## 2017-05-05 ENCOUNTER — Other Ambulatory Visit: Payer: Self-pay | Admitting: Family Medicine

## 2017-05-05 ENCOUNTER — Other Ambulatory Visit: Payer: Self-pay

## 2017-05-05 DIAGNOSIS — N760 Acute vaginitis: Principal | ICD-10-CM

## 2017-05-05 DIAGNOSIS — B9689 Other specified bacterial agents as the cause of diseases classified elsewhere: Secondary | ICD-10-CM

## 2017-05-05 DIAGNOSIS — R110 Nausea: Secondary | ICD-10-CM

## 2017-05-05 LAB — CYTOLOGY - PAP
ADEQUACY: ABSENT
Bacterial vaginitis: POSITIVE — AB
CHLAMYDIA, DNA PROBE: NEGATIVE
Candida vaginitis: NEGATIVE
Diagnosis: NEGATIVE
NEISSERIA GONORRHEA: NEGATIVE
Trichomonas: NEGATIVE

## 2017-05-05 MED ORDER — ONDANSETRON HCL 4 MG PO TABS: 4.0000 mg | ORAL_TABLET | Freq: Three times a day (TID) | ORAL | 2 refills | Status: DC | PRN

## 2017-05-05 MED ORDER — METRONIDAZOLE 500 MG PO TABS: 500.0000 mg | ORAL_TABLET | Freq: Two times a day (BID) | ORAL | 0 refills | Status: DC

## 2017-05-05 NOTE — Progress Notes (Signed)
I have called and Informed patient of negative pap smear and that she was positive for bacterial vaginitis. Advised patient that she would need to take metronidazole which has been sent to pharmacy twice daily for 7 days to correct this. Advised patient that it is very important that she avoid alcohol while on this medication. Patient verbalized understanding. I also advised of rx for oxycodone ready for pick up tomorrow 05/06/2017 in our office.   Patient is asking for a refill on nausea medication. Can this be sent in? Please advise. Thanks!

## 2017-05-05 NOTE — Telephone Encounter (Signed)
Refilled zofran to pharmacy. Thanks!

## 2017-05-09 LAB — CERVICOVAGINAL ANCILLARY ONLY: Herpes: NEGATIVE

## 2017-05-31 ENCOUNTER — Telehealth: Payer: Self-pay

## 2017-06-03 ENCOUNTER — Other Ambulatory Visit: Payer: Self-pay | Admitting: Family Medicine

## 2017-06-03 DIAGNOSIS — D57219 Sickle-cell/Hb-C disease with crisis, unspecified: Secondary | ICD-10-CM

## 2017-06-03 MED ORDER — OXYCODONE HCL 10 MG PO TABS
10.0000 mg | ORAL_TABLET | Freq: Four times a day (QID) | ORAL | 0 refills | Status: DC | PRN
Start: 2017-06-03 — End: 2017-06-24

## 2017-06-03 NOTE — Progress Notes (Signed)
Reviewed [place] Substance Reporting system prior to prescribing opiate medications. No inconsistencies noted.    Meds ordered this encounter  Medications  . Oxycodone HCl 10 MG TABS    Sig: Take 1 tablet (10 mg total) by mouth every 6 (six) hours as needed.    Dispense:  60 tablet    Refill:  0    Order Specific Question:   Supervising Provider    Answer:   JEGEDE, [name] [[id number]]    Hannah Korol Moore Zade Falkner  MSN, FNP-C [facility] [address]  [place], [place] [address] [phone]  

## 2017-06-14 ENCOUNTER — Encounter (HOSPITAL_COMMUNITY): Payer: Self-pay | Admitting: Family Medicine

## 2017-06-14 ENCOUNTER — Emergency Department (HOSPITAL_COMMUNITY)
Admission: EM | Admit: 2017-06-14 | Discharge: 2017-06-14 | Disposition: A | Discharge status: Home / Self Care | Payer: Medicaid Other | Attending: Emergency Medicine | Admitting: Emergency Medicine

## 2017-06-14 ENCOUNTER — Telehealth: Payer: Self-pay

## 2017-06-14 DIAGNOSIS — R42 Dizziness and giddiness: Secondary | ICD-10-CM

## 2017-06-14 DIAGNOSIS — Z87891 Personal history of nicotine dependence: Secondary | ICD-10-CM | POA: Diagnosis not present

## 2017-06-14 DIAGNOSIS — D5700 Hb-SS disease with crisis, unspecified: Secondary | ICD-10-CM

## 2017-06-14 DIAGNOSIS — R110 Nausea: Secondary | ICD-10-CM

## 2017-06-14 LAB — COMPREHENSIVE METABOLIC PANEL
ALT: 16 U/L (ref 14–54)
ANION GAP: 4 — AB (ref 5–15)
AST: 21 U/L (ref 15–41)
Albumin: 3.7 g/dL (ref 3.5–5.0)
Alkaline Phosphatase: 43 U/L (ref 38–126)
BILIRUBIN TOTAL: 0.2 mg/dL — AB (ref 0.3–1.2)
BUN: <5 mg/dL — ABNORMAL LOW (ref 6–20)
CHLORIDE: 105 mmol/L (ref 101–111)
CO2: 27 mmol/L (ref 22–32)
Calcium: 8.6 mg/dL — ABNORMAL LOW (ref 8.9–10.3)
Creatinine, Ser: 0.63 mg/dL (ref 0.44–1.00)
GFR calc Af Amer: >60 mL/min (ref >60)
Glucose, Bld: 142 mg/dL — ABNORMAL HIGH (ref 65–99)
POTASSIUM: 3.6 mmol/L (ref 3.5–5.1)
Sodium: 136 mmol/L (ref 135–145)
TOTAL PROTEIN: 6.6 g/dL (ref 6.5–8.1)

## 2017-06-14 LAB — CBC WITH DIFFERENTIAL/PLATELET
BASOS ABS: 0.1 10*3/uL (ref 0.0–0.1)
Basophils Relative: 1 %
Eosinophils Absolute: 0.2 10*3/uL (ref 0.0–0.7)
Eosinophils Relative: 3 %
HEMATOCRIT: 28.9 % — AB (ref 36.0–46.0)
HEMOGLOBIN: 10.8 g/dL — AB (ref 12.0–15.0)
LYMPHS PCT: 43 %
Lymphs Abs: 3.2 10*3/uL (ref 0.7–4.0)
MCH: 26 pg (ref 26.0–34.0)
MCHC: 37.4 g/dL — ABNORMAL HIGH (ref 30.0–36.0)
MCV: 69.5 fL — ABNORMAL LOW (ref 78.0–100.0)
MONOS PCT: 8 %
Monocytes Absolute: 0.6 10*3/uL (ref 0.1–1.0)
NEUTROS PCT: 45 %
Neutro Abs: 3.4 10*3/uL (ref 1.7–7.7)
Platelets: 285 10*3/uL (ref 150–400)
RBC: 4.16 MIL/uL (ref 3.87–5.11)
RDW: 14.3 % (ref 11.5–15.5)
WBC: 7.5 10*3/uL (ref 4.0–10.5)

## 2017-06-14 LAB — RETICULOCYTES
RBC.: 4.16 MIL/uL (ref 3.87–5.11)
RETIC COUNT ABSOLUTE: 133.1 10*3/uL (ref 19.0–186.0)
Retic Ct Pct: 3.2 % — ABNORMAL HIGH (ref 0.4–3.1)

## 2017-06-14 MED ORDER — HYDROMORPHONE HCL 1 MG/ML IJ SOLN
1.0000 mg | INTRAMUSCULAR | Status: AC
  Administered 2017-06-14: 1 mg via INTRAVENOUS
  Filled 2017-06-14: qty 1

## 2017-06-14 MED ORDER — KETOROLAC TROMETHAMINE 15 MG/ML IJ SOLN
15.0000 mg | INTRAMUSCULAR | Status: AC
  Administered 2017-06-14: 15 mg via INTRAVENOUS
  Filled 2017-06-14: qty 1

## 2017-06-14 MED ORDER — ONDANSETRON HCL 4 MG/2ML IJ SOLN
4.0000 mg | INTRAMUSCULAR | Status: DC | PRN
  Administered 2017-06-14: 4 mg via INTRAVENOUS
  Filled 2017-06-14: qty 2

## 2017-06-14 MED ORDER — HYDROMORPHONE HCL 1 MG/ML IJ SOLN: 1.0000 mg | INTRAMUSCULAR | Status: DC

## 2017-06-14 MED ORDER — MECLIZINE HCL 25 MG PO TABS: 25.0000 mg | ORAL_TABLET | Freq: Three times a day (TID) | ORAL | 0 refills | Status: DC | PRN

## 2017-06-14 MED ORDER — HYDROMORPHONE HCL 1 MG/ML IJ SOLN: 0.5000 mg | INTRAMUSCULAR | Status: DC

## 2017-06-14 MED ORDER — HYDROMORPHONE HCL 1 MG/ML IJ SOLN
0.5000 mg | Freq: Once | INTRAMUSCULAR | Status: AC
  Administered 2017-06-14: 0.5 mg via SUBCUTANEOUS
  Filled 2017-06-14: qty 0.5

## 2017-06-14 MED ORDER — SODIUM CHLORIDE 0.45 % IV SOLN
INTRAVENOUS | Status: DC
  Administered 2017-06-14: 18:00:00 via INTRAVENOUS

## 2017-06-14 MED ORDER — ONDANSETRON HCL 4 MG PO TABS: 4.0000 mg | ORAL_TABLET | Freq: Three times a day (TID) | ORAL | 2 refills | Status: DC | PRN

## 2017-06-14 MED ORDER — HYDROMORPHONE HCL 1 MG/ML IJ SOLN
0.5000 mg | INTRAMUSCULAR | Status: AC
  Administered 2017-06-14: 0.5 mg via INTRAVENOUS
  Filled 2017-06-14: qty 0.5

## 2017-06-14 MED ORDER — ONDANSETRON 4 MG PO TBDP
4.0000 mg | ORAL_TABLET | Freq: Once | ORAL | Status: AC
  Administered 2017-06-14: 4 mg via ORAL
  Filled 2017-06-14: qty 1

## 2017-06-14 MED ORDER — DIPHENHYDRAMINE HCL 25 MG PO CAPS
25.0000 mg | ORAL_CAPSULE | ORAL | Status: DC | PRN
  Administered 2017-06-14: 25 mg via ORAL
  Filled 2017-06-14: qty 1

## 2017-06-14 NOTE — ED Triage Notes (Signed)
Patient is complaining of dizziness that started 2 weeks ago and sickle cell pain in her left hamstring. Pt is ambulatory to triage with a steady gait. Pt's last dose of pain was OXYCODONE 20mg  at 4:00am.

## 2017-06-14 NOTE — Discharge Instructions (Signed)
Follow-up with your sickle cell doctor, you can try taking the meclizine as needed for dizziness. Return as needed for worsening symptoms

## 2017-06-14 NOTE — ED Notes (Signed)
Pt eating a subway sandwich.

## 2017-06-14 NOTE — Telephone Encounter (Signed)
Refill for zofran has been sent into pharmacy. Thanks!

## 2017-06-14 NOTE — ED Provider Notes (Signed)
[facility]-EMERGENCY DEPT Provider Note   CSN: [id number] Arrival date & time: 06/14/17  1316     History   Chief Complaint Chief Complaint  Patient presents with  . Dizziness  . Sickle Cell Pain Crisis    HPI Hannah Hawkins is a 31 y.o. female.  HPI Patient presents to the emergency room for evaluation of dizziness and pain associated with her sickle cell disease. Patient states she's been having trouble with dizziness for the last couple of weeks. The symptoms seemed to increase when she tilts her head down.  She denies any headache. No difficulty with her gait. No trouble with her speech. No focal numbness or weakness.  Patient also complains of pain associated with a sickle cell crisis. She is having pain in her left leg started stairs so. She called the sickle cell center today but was after 1:00 and she was instructed to come to the emergency room. She denies any fevers chills. No vomiting or diarrhea. No chest pain or shortness of breath. Pain is a 9 out of 10 and not responding to her oral medications. Past Medical History:  Diagnosis Date  . Sickle cell anemia [facility])     Patient Active Problem List   Diagnosis Date Noted  . Sickle cell disease, type [place], with unspecified crisis (HCC) 02/18/2017    Past Surgical History:  Procedure Laterality Date  . EYE SURGERY  03/09/2017   left eye    OB History    No data available       Home Medications    Prior to Admission medications   Medication Sig Start Date End Date Taking? Authorizing Provider  ergocalciferol (VITAMIN D2) 50000 units capsule Take 1 capsule (50,000 Units total) by mouth once a week. 02/22/17  Yes Massie Maroon, FNP  ibuprofen (ADVIL,MOTRIN) 800 MG tablet Take 1 tablet (800 mg total) by mouth every 8 (eight) hours as needed for moderate pain. 04/14/17  Yes Massie Maroon, FNP  omeprazole (PRILOSEC) 20 MG capsule Take 20 mg by mouth.   Yes [provider]  ondansetron (ZOFRAN) 4 MG tablet  Take 1 tablet (4 mg total) by mouth every 8 (eight) hours as needed for nausea or vomiting. 06/14/17  Yes Massie Maroon, FNP  Oxycodone HCl 10 MG TABS Take 1 tablet (10 mg total) by mouth every 6 (six) hours as needed. 06/03/17  Yes Massie Maroon, FNP  folic acid (FOLVITE) 1 MG tablet Take 1 tablet (1 mg total) by mouth daily. Patient not taking: Reported on 03/19/2017 02/18/17   Massie Maroon, FNP  meclizine (ANTIVERT) 25 MG tablet Take 1 tablet (25 mg total) by mouth 3 (three) times daily as needed for dizziness. 06/14/17   Linwood Dibbles, MD  metroNIDAZOLE (FLAGYL) 500 MG tablet Take 1 tablet (500 mg total) by mouth 2 (two) times daily. Patient not taking: Reported on 06/14/2017 05/05/17   Massie Maroon, FNP    Family History History reviewed. No pertinent family history.  Social History Social History  Substance Use Topics  . Smoking status: Former Games developer  . Smokeless tobacco: Never Used  . Alcohol use Yes     Comment: Twice a month     Allergies   Penicillins   Review of Systems Review of Systems  All other systems reviewed and are negative.    Physical Exam Updated Vital Signs BP (!) 89/53   Pulse 65   Temp 97.9 F (36.6 C) (Oral)   Resp (!) 8  Ht 1.6 m (5\' 3" )   Wt 62.1 kg (137 lb)   LMP 05/20/2017   SpO2 99%   BMI 24.27 kg/m   Physical Exam  Constitutional: She appears well-developed and well-nourished. No distress.  HENT:  Head: Normocephalic and atraumatic.  Right Ear: External ear normal.  Left Ear: External ear normal.  Nl tm bilaterally  Eyes: Conjunctivae are normal. Right eye exhibits no discharge. Left eye exhibits no discharge. No scleral icterus.  Neck: Neck supple. No tracheal deviation present.  Cardiovascular: Normal rate, regular rhythm and intact distal pulses.   Pulmonary/Chest: Effort normal and breath sounds normal. No stridor. No respiratory distress. She has no wheezes. She has no rales.  Abdominal: Soft. Bowel sounds are  normal. She exhibits no distension. There is no tenderness. There is no rebound and no guarding.  Musculoskeletal: She exhibits no edema or tenderness.  Neurological: She is alert. She has normal strength. No cranial nerve deficit (no facial droop, extraocular movements intact, no slurred speech) or sensory deficit. She exhibits normal muscle tone. She displays no seizure activity. Coordination normal.  Skin: Skin is warm and dry. No rash noted.  Psychiatric: She has a normal mood and affect.  Nursing note and vitals reviewed.    ED Treatments / Results  Labs (all labs ordered are listed, but only abnormal results are displayed) Labs Reviewed  COMPREHENSIVE METABOLIC PANEL - Abnormal; Notable for the following:       Result Value   Glucose, Bld 142 (*)    BUN <5 (*)    Calcium 8.6 (*)    Total Bilirubin 0.2 (*)    Anion gap 4 (*)    All other components within normal limits  CBC WITH DIFFERENTIAL/PLATELET - Abnormal; Notable for the following:    Hemoglobin 10.8 (*)    HCT 28.9 (*)    MCV 69.5 (*)    MCHC 37.4 (*)    All other components within normal limits  RETICULOCYTES - Abnormal; Notable for the following:    Retic Ct Pct 3.2 (*)    All other components within normal limits     Radiology No results found.  Procedures Procedures (including critical care time)  Medications Ordered in ED Medications  0.45 % sodium chloride infusion ( Intravenous New Bag/Given 06/14/17 1804)  HYDROmorphone (DILAUDID) injection 1 mg (1 mg Intravenous Given 06/14/17 2027)  diphenhydrAMINE (BENADRYL) capsule 25-50 mg (25 mg Oral Given 06/14/17 1754)  ondansetron (ZOFRAN) injection 4 mg (4 mg Intravenous Given 06/14/17 1807)  HYDROmorphone (DILAUDID) injection 0.5 mg (0.5 mg Subcutaneous Given 06/14/17 1413)  ondansetron (ZOFRAN-ODT) disintegrating tablet 4 mg (4 mg Oral Given 06/14/17 1412)  ketorolac (TORADOL) 15 MG/ML injection 15 mg (15 mg Intravenous Given 06/14/17 1806)  HYDROmorphone  (DILAUDID) injection 0.5 mg (0.5 mg Intravenous Given 06/14/17 1808)  HYDROmorphone (DILAUDID) injection 1 mg (1 mg Intravenous Given 06/14/17 1842)  HYDROmorphone (DILAUDID) injection 1 mg (1 mg Intravenous Given 06/14/17 1928)     Initial Impression / Assessment and Plan / ED Course  I have reviewed the triage vital signs and the nursing notes.  Pertinent labs & imaging results that were available during my care of the patient were reviewed by me and considered in my medical decision making (see chart for details).  Clinical Course as of Jun 14 2157  Tue Jun 14, 2017  1920 Hgb is 10.8.  Lower than previous values but no need for transfusion  [JK]  1921 Retic count is similar to previous values  [  JK]    Clinical Course User Index [JK] Linwood DibblesKnapp, Flower Franko, MD    Pt Presented to emergency room with symptoms concerning for sickle cell crisis. She improved with treatment in the emergency room. Patient also mentions having dizziness type spells. She has a normal neurologic exams. No symptoms to suggest stroke. Symptoms suggestive of peripheral vertigo. We discussed outpatient follow-up with a primary doctor. I will give her prescription for meclizine for symptomatic relief  Final Clinical Impressions(s) / ED Diagnoses   Final diagnoses:  Sickle cell anemia with crisis (HCC)  Vertigo    New Prescriptions New Prescriptions   MECLIZINE (ANTIVERT) 25 MG TABLET    Take 1 tablet (25 mg total) by mouth 3 (three) times daily as needed for dizziness.     Linwood DibblesKnapp, Arnoldo Hildreth, MD 06/14/17 2159

## 2017-06-23 ENCOUNTER — Telehealth: Payer: Self-pay

## 2017-06-24 ENCOUNTER — Other Ambulatory Visit: Payer: Self-pay | Admitting: Family Medicine

## 2017-06-24 DIAGNOSIS — D57219 Sickle-cell/Hb-C disease with crisis, unspecified: Secondary | ICD-10-CM

## 2017-06-24 MED ORDER — OXYCODONE HCL 10 MG PO TABS: 10.0000 mg | ORAL_TABLET | Freq: Four times a day (QID) | ORAL | 0 refills | Status: DC | PRN

## 2017-06-24 NOTE — Progress Notes (Signed)
Reviewed [place] Substance Reporting system prior to prescribing opiate medications. No inconsistencies noted.    Meds ordered this encounter  Medications  . Oxycodone HCl 10 MG TABS    Sig: Take 1 tablet (10 mg total) by mouth every 6 (six) hours as needed.    Dispense:  60 tablet    Refill:  0    Order Specific Question:   Supervising Provider    Answer:   JEGEDE, [name] [[id number]]    Hannah Armendarez Moore Kentley Cedillo  MSN, FNP-C [facility] [address]  [place],  [address] [phone]  

## 2017-06-27 ENCOUNTER — Other Ambulatory Visit: Payer: Self-pay | Admitting: Family Medicine

## 2017-06-27 DIAGNOSIS — D57219 Sickle-cell/Hb-C disease with crisis, unspecified: Secondary | ICD-10-CM

## 2017-06-27 MED ORDER — IBUPROFEN 800 MG PO TABS: 800.0000 mg | ORAL_TABLET | Freq: Three times a day (TID) | ORAL | 5 refills | Status: DC | PRN

## 2017-07-14 ENCOUNTER — Telehealth: Payer: Self-pay

## 2017-07-15 ENCOUNTER — Other Ambulatory Visit: Payer: Medicaid Other

## 2017-07-15 ENCOUNTER — Other Ambulatory Visit: Payer: Self-pay | Admitting: Family Medicine

## 2017-07-15 DIAGNOSIS — Z79891 Long term (current) use of opiate analgesic: Secondary | ICD-10-CM

## 2017-07-15 DIAGNOSIS — D57219 Sickle-cell/Hb-C disease with crisis, unspecified: Secondary | ICD-10-CM

## 2017-07-15 MED ORDER — OXYCODONE HCL 10 MG PO TABS: 10.0000 mg | ORAL_TABLET | Freq: Four times a day (QID) | ORAL | 0 refills | Status: DC | PRN

## 2017-07-15 NOTE — Progress Notes (Signed)
Reviewed [place] Substance Reporting system prior to prescribing opiate medications. No inconsistencies noted.    Meds ordered this encounter  Medications  . Oxycodone HCl 10 MG TABS    Sig: Take 1 tablet (10 mg total) by mouth every 6 (six) hours as needed.    Dispense:  60 tablet    Refill:  0    Order Specific Question:   Supervising Provider    Answer:   JEGEDE, [name] [[id number]]    Weylin Plagge Moore Thena Devora  MSN, FNP-C [facility] [address]  [place], [place] [address] [phone]  

## 2017-07-21 LAB — PAIN MGMT, PROFILE 8 W/CONF, U
6 ACETYLMORPHINE: NEGATIVE ng/mL (ref <10)
ALCOHOL METABOLITES: NEGATIVE ng/mL (ref <500)
AMPHETAMINES: NEGATIVE ng/mL (ref <500)
BUPRENORPHINE: NEGATIVE ng/mL (ref <5)
Benzodiazepines: NEGATIVE ng/mL (ref <100)
Benzoylecgonine: 1738 ng/mL — ABNORMAL HIGH (ref <100)
COCAINE METABOLITE: POSITIVE ng/mL — AB (ref <150)
CREATININE: 169 mg/dL (ref >=20.0)
Codeine: NEGATIVE ng/mL (ref <50)
HYDROCODONE: NEGATIVE ng/mL (ref <50)
Hydromorphone: NEGATIVE ng/mL (ref <50)
MDMA: NEGATIVE ng/mL (ref <500)
MORPHINE: NEGATIVE ng/mL (ref <50)
Marijuana Metabolite: NEGATIVE ng/mL (ref <20)
NORHYDROCODONE: NEGATIVE ng/mL (ref <50)
Noroxycodone: 17806 ng/mL — ABNORMAL HIGH (ref <50)
OPIATES: NEGATIVE ng/mL (ref <100)
OXIDANT: NEGATIVE ug/mL (ref <200)
OXYCODONE: 10718 ng/mL — AB (ref <50)
OXYMORPHONE: 7531 ng/mL — AB (ref <50)
Oxycodone: POSITIVE ng/mL — AB (ref <100)
PH: 6.43 (ref 4.5–9.0)
Please note:: 0

## 2017-07-25 ENCOUNTER — Encounter: Payer: Self-pay | Admitting: Family Medicine

## 2017-07-25 NOTE — Progress Notes (Signed)
Jackolyn ConferBeatrice Llamas, a 31 year old female with a history of sickle cell anemia, HbSC is screened periodically with drug of abuse profiles due to chronic prescription opiate use.  Patient's drug profile was positive for drugs of abuse. Reviewed [place] Substance Reporting system prior to prescribing opiate medications. No inconsistencies noted. Patient has an appointment scheduled on 08/04/2017, will address further during follow up appointment.    Nolon NationsLaChina Moore Latarra Eagleton  MSN, FNP-C [facility] [address]  [place], [place] [id number] [phone]

## 2017-08-02 ENCOUNTER — Other Ambulatory Visit: Payer: Self-pay | Admitting: Family Medicine

## 2017-08-02 DIAGNOSIS — B9689 Other specified bacterial agents as the cause of diseases classified elsewhere: Secondary | ICD-10-CM

## 2017-08-02 DIAGNOSIS — D57219 Sickle-cell/Hb-C disease with crisis, unspecified: Secondary | ICD-10-CM

## 2017-08-02 DIAGNOSIS — N760 Acute vaginitis: Principal | ICD-10-CM

## 2017-08-02 DIAGNOSIS — Z79891 Long term (current) use of opiate analgesic: Secondary | ICD-10-CM

## 2017-08-04 ENCOUNTER — Ambulatory Visit (INDEPENDENT_AMBULATORY_CARE_PROVIDER_SITE_OTHER): Payer: Medicaid Other | Admitting: Family Medicine

## 2017-08-04 VITALS — BP 118/79 | HR 73 | Temp 98.0°F | Resp 16 | Ht 62.0 in | Wt 138.0 lb

## 2017-08-04 DIAGNOSIS — F329 Major depressive disorder, single episode, unspecified: Secondary | ICD-10-CM

## 2017-08-04 DIAGNOSIS — Z79891 Long term (current) use of opiate analgesic: Secondary | ICD-10-CM | POA: Diagnosis not present

## 2017-08-04 DIAGNOSIS — K5903 Drug induced constipation: Secondary | ICD-10-CM

## 2017-08-04 DIAGNOSIS — F1490 Cocaine use, unspecified, uncomplicated: Secondary | ICD-10-CM

## 2017-08-04 DIAGNOSIS — R1084 Generalized abdominal pain: Secondary | ICD-10-CM

## 2017-08-04 DIAGNOSIS — D57219 Sickle-cell/Hb-C disease with crisis, unspecified: Secondary | ICD-10-CM

## 2017-08-04 DIAGNOSIS — F32A Depression, unspecified: Secondary | ICD-10-CM

## 2017-08-04 LAB — POCT URINALYSIS DIP (DEVICE)
BILIRUBIN URINE: NEGATIVE
Glucose, UA: NEGATIVE mg/dL
HGB URINE DIPSTICK: NEGATIVE
KETONES UR: NEGATIVE mg/dL
Leukocytes, UA: NEGATIVE
Nitrite: NEGATIVE
Protein, ur: NEGATIVE mg/dL
SPECIFIC GRAVITY, URINE: 1.015 (ref 1.005–1.030)
Urobilinogen, UA: 1 mg/dL (ref 0.0–1.0)
pH: 6.5 (ref 5.0–8.0)

## 2017-08-04 LAB — CBC WITH DIFFERENTIAL/PLATELET
BASOS ABS: 67 {cells}/uL (ref 0–200)
BASOS PCT: 1 %
EOS ABS: 201 {cells}/uL (ref 15–500)
Eosinophils Relative: 3 %
HEMATOCRIT: 36.8 % (ref 35.0–45.0)
HEMOGLOBIN: 12.4 g/dL (ref 11.7–15.5)
LYMPHS ABS: 2546 {cells}/uL (ref 850–3900)
Lymphocytes Relative: 38 %
MCH: 26.6 pg — AB (ref 27.0–33.0)
MCHC: 33.7 g/dL (ref 32.0–36.0)
MCV: 79 fL — AB (ref 80.0–100.0)
MONO ABS: 670 {cells}/uL (ref 200–950)
MPV: 9.4 fL (ref 7.5–12.5)
Monocytes Relative: 10 %
NEUTROS ABS: 3216 {cells}/uL (ref 1500–7800)
Neutrophils Relative %: 48 %
Platelets: 377 10*3/uL (ref 140–400)
RBC: 4.66 MIL/uL (ref 3.80–5.10)
RDW: 15.2 % — ABNORMAL HIGH (ref 11.0–15.0)
WBC: 6.7 10*3/uL (ref 3.8–10.8)

## 2017-08-04 MED ORDER — FOLIC ACID 1 MG PO TABS: 1.0000 mg | ORAL_TABLET | Freq: Every day | ORAL | 11 refills | Status: DC

## 2017-08-04 MED ORDER — NALOXEGOL OXALATE 12.5 MG PO TABS: 12.5000 mg | ORAL_TABLET | Freq: Every day | ORAL | 2 refills | Status: DC

## 2017-08-04 MED ORDER — OXYCODONE HCL 10 MG PO TABS: 10.0000 mg | ORAL_TABLET | Freq: Four times a day (QID) | ORAL | 0 refills | Status: DC | PRN

## 2017-08-04 NOTE — Patient Instructions (Addendum)
Sickle cell disease, type [place], with unspecified crisis (HCC) Will follow up by by phone with any abnormal laboratory resutls - folic acid (FOLVITE) 1 MG tablet; Take 1 tablet (1 mg total) by mouth daily.  Dispense: 30 tablet; Refill:   Discussed the importance of drinking 64 ounces of water daily. The Importance of Water. To help prevent pain crises, it is important to drink plenty of water throughout the day. This is because dehydration of red blood cells may lead to the sickling process.     Generalized abdominal pain Will review labs as results become available Will start a trial of Movantic for opiate induced constipation   Drug-induced constipation - naloxegol oxalate (MOVANTIK) 12.5 MG TABS tablet; Take 1 tablet (12.5 mg total) by mouth daily.  Dispense: 30 tablet; Refill: 2  Chronic prescription opiate use Will follow up on August 30th for a pill count  - Oxycodone HCl 10 MG TABS; Take 1 tablet (10 mg total) by mouth every 6 (six) hours as needed.  Dispense: 30 tablet; Refill: 0

## 2017-08-04 NOTE — Progress Notes (Signed)
Subjective:    Patient ID: Hannah Hawkins, female    DOB: 1986/10/30, 31 y.o.   MRN: [id number]  HPI Ms. Shreya Lacasse, a 31 year old female with a history of sickle cell anemia, presents for a 1 month follow-up. She is complaining of increased fatigue and pain over the past several weeks. Pain is primarily to lower extremities. She has been taking Oxycodone 10 mg without sustained relief. Current pain intensity is 6/10. She says that she has not attempted any OTC analgesics to alleviate current symptoms. Patient receives all prescriptions for opiate medications from this clinic. Periodic drug screens are obtained with chronic prescription drug use. Reviewed drug screen from 07/15/2017, cocaine metabolites were present.  She denies headache, chest pains, shortness of breath, dysuria, nausea, vomiting, or diarrhea.  Past Medical History:  Diagnosis Date  . Sickle cell anemia (HCC)    Immunization History  Administered Date(s) Administered  . Pneumococcal Polysaccharide-23 02/18/2017  . Tdap 02/18/2017   Social History   Social History  . Marital status: Single    Spouse name: N/A  . Number of children: N/A  . Years of education: N/A   Occupational History  . Not on file.   Social History Main Topics  . Smoking status: Former Games developer  . Smokeless tobacco: Never Used  . Alcohol use Yes     Comment: Twice a month  . Drug use: No  . Sexual activity: Yes   Other Topics Concern  . Not on file   Social History Narrative  . No narrative on file       Opioid Risk Tool - 08/04/17 1100      Family History of Substance Abuse   Alcohol Negative   Illegal Drugs Negative   Rx Drugs Negative     Personal History of Substance Abuse   Alcohol Positive Female or Female   Illegal Drugs Positive Female or Female   Rx Drugs Positive Female or Female     Age   Age between 36-45 years  Yes     History of Preadolescent Sexual Abuse   History of Preadolescent Sexual Abuse Negative or  Female     Psychological Disease   Psychological Disease Negative   Depression Negative     Total Score   Opioid Risk Tool Scoring 13   Opioid Risk Interpretation High Risk     Review of Systems  Constitutional: Negative for unexpected weight change.  HENT: Negative.   Eyes: Positive for pain (Recent eye surgery). Negative for visual disturbance.  Respiratory: Negative.   Cardiovascular: Negative for chest pain, palpitations and leg swelling.  Gastrointestinal: Negative.   Endocrine: Negative.  Negative for polydipsia, polyphagia and polyuria.  Genitourinary: Negative.   Musculoskeletal: Positive for arthralgias, back pain and myalgias.  Skin: Negative.   Allergic/Immunologic: Negative.  Negative for immunocompromised state.  Neurological: Negative.   Hematological: Negative.   Psychiatric/Behavioral: Negative.        Objective:   Physical Exam  Constitutional: She is oriented to person, place, and time. She appears well-developed and well-nourished.  HENT:  Head: Normocephalic and atraumatic.  Right Ear: External ear normal.  Left Ear: External ear normal.  Mouth/Throat: Oropharynx is clear and moist.  Eyes: Pupils are equal, round, and reactive to light. Conjunctivae and EOM are normal.  Neck: Normal range of motion. Neck supple.  Cardiovascular: Normal rate, regular rhythm, normal heart sounds and intact distal pulses.   Pulmonary/Chest: Effort normal and breath sounds normal.  Abdominal: Soft. Bowel  sounds are normal.  Musculoskeletal:       Right ankle: She exhibits normal pulse.       Lumbar back: She exhibits pain and spasm.  Neurological: She is alert and oriented to person, place, and time. She has normal reflexes.  Skin: Skin is warm and dry.  Psychiatric: She has a normal mood and affect. Her behavior is normal. Judgment and thought content normal.      BP 118/79 (BP Location: Left Arm, Patient Position: Sitting, Cuff Size: Normal)   Pulse 73   Temp 98 F  (36.7 C) (Oral)   Resp 16   Ht 5\' 2"  (1.575 m)   Wt 138 lb (62.6 kg)   LMP 07/18/2017   SpO2 100%   BMI 25.24 kg/m  Assessment & Plan:   1. Sickle cell disease, type [place], with unspecified crisis (HCC) Sickle cell disease - Will transition to the day infusion center for fluids.  Will start folic acid 1 mg daily to prevent aplastic bone marrow crises.  Patient will transition to the day infusion center for extended observation and pain management for 6-7/10 pain related to sickle cell anemia. Patient is not interested in [place] to reduce sickle cell crisis,  discussed at length.   Pulmonary evaluation - Patient denies severe recurrent wheezes, shortness of breath with exercise, or persistent cough. If these symptoms develop, pulmonary function tests with spirometry will be ordered, and if abnormal, plan on referral to Pulmonology for further evaluation.  Cardiac - Baseline EKG Eye -Eye exam 1 month ago at [facility], with Dr. Carmela Rima  Immunization status - Vaccination up to date  Acute and chronic painful episodes - We agreed on Oxycodone 10 mg today.   We discussed that pt is to receive her Schedule II prescriptions only from [place]. Pt is also aware that the prescription history is available to [place] online through the [employer] CSRS. Patient will return for biweekly pill counts.  Controlled substance agreement signed 03/23/2017.  We reminded Ms. Shutter that all patients receiving Schedule II narcotics must be seen for follow within one month of prescription being requested. We reviewed the terms of our pain agreement, including the need to keep medicines in a safe locked location away from children or pets, and the need to report excess sedation or constipation, measures to avoid constipation, and policies related to early refills and stolen prescriptions. According to the [place] [facility] program, we have reviewed details related to analgesia, adverse effects, aberrant  behaviors. - folic acid (FOLVITE) 1 MG tablet; Take 1 tablet (1 mg total) by mouth daily.  Dispense: 30 tablet; Refill: 11 - CBC with Differential - COMPLETE METABOLIC PANEL WITH GFR - Oxycodone HCl 10 MG TABS; Take 1 tablet (10 mg total) by mouth every 6 (six) hours as needed.  Dispense: 30 tablet; Refill: 0 - POCT urinalysis dip (device)  2. Drug-induced constipation - naloxegol oxalate (MOVANTIK) 12.5 MG TABS tablet; Take 1 tablet (12.5 mg total) by mouth daily.  Dispense: 30 tablet; Refill: 2  3. Chronic prescription opiate use Reviewed [place] Substance Reporting system prior to prescribing opiate medications. No inconsistencies noted.   - Oxycodone HCl 10 MG TABS; Take 1 tablet (10 mg total) by mouth every 6 (six) hours as needed.  Dispense: 30 tablet; Refill: 0  4.Depression, unspecified depression type - Ambulatory referral to Psychiatry  5. Cocaine use Discussed illicit drug use at length - Ambulatory referral to Psychiatry     Nolon Nations  MSN, FNP-C [facility] [address]  [place], [place] [address] [phone]   The patient was given clear instructions to go to ER or return to medical center if symptoms do not improve, worsen or new problems develop. The patient verbalized understanding.

## 2017-08-05 LAB — COMPLETE METABOLIC PANEL WITH GFR
ALBUMIN: 4.3 g/dL (ref 3.6–5.1)
ALK PHOS: 45 U/L (ref 33–115)
ALT: 10 U/L (ref 6–29)
AST: 15 U/L (ref 10–30)
BILIRUBIN TOTAL: 0.6 mg/dL (ref 0.2–1.2)
BUN: 11 mg/dL (ref 7–25)
CALCIUM: 9.2 mg/dL (ref 8.6–10.2)
CO2: 22 mmol/L (ref 20–32)
CREATININE: 0.69 mg/dL (ref 0.50–1.10)
Chloride: 104 mmol/L (ref 98–110)
Glucose, Bld: 87 mg/dL (ref 65–99)
Potassium: 4 mmol/L (ref 3.5–5.3)
Sodium: 139 mmol/L (ref 135–146)
TOTAL PROTEIN: 6.6 g/dL (ref 6.1–8.1)

## 2017-08-07 ENCOUNTER — Encounter: Payer: Self-pay | Admitting: Family Medicine

## 2017-08-16 ENCOUNTER — Telehealth: Payer: Self-pay

## 2017-08-18 ENCOUNTER — Other Ambulatory Visit: Payer: Self-pay | Admitting: Internal Medicine

## 2017-08-18 ENCOUNTER — Encounter (HOSPITAL_COMMUNITY): Payer: Self-pay | Admitting: Emergency Medicine

## 2017-08-18 ENCOUNTER — Emergency Department (HOSPITAL_COMMUNITY)
Admission: EM | Admit: 2017-08-18 | Discharge: 2017-08-18 | Disposition: A | Discharge status: Home / Self Care | Payer: Medicaid Other | Attending: Emergency Medicine | Admitting: Emergency Medicine

## 2017-08-18 DIAGNOSIS — D5700 Hb-SS disease with crisis, unspecified: Secondary | ICD-10-CM | POA: Insufficient documentation

## 2017-08-18 DIAGNOSIS — Z79891 Long term (current) use of opiate analgesic: Secondary | ICD-10-CM

## 2017-08-18 DIAGNOSIS — D57219 Sickle-cell/Hb-C disease with crisis, unspecified: Secondary | ICD-10-CM

## 2017-08-18 DIAGNOSIS — M79604 Pain in right leg: Secondary | ICD-10-CM | POA: Diagnosis present

## 2017-08-18 LAB — COMPREHENSIVE METABOLIC PANEL
ALT: 14 U/L (ref 14–54)
AST: 21 U/L (ref 15–41)
Albumin: 4.2 g/dL (ref 3.5–5.0)
Alkaline Phosphatase: 42 U/L (ref 38–126)
Anion gap: 6 (ref 5–15)
BUN: 10 mg/dL (ref 6–20)
CALCIUM: 9.1 mg/dL (ref 8.9–10.3)
CO2: 25 mmol/L (ref 22–32)
Chloride: 107 mmol/L (ref 101–111)
Creatinine, Ser: 0.8 mg/dL (ref 0.44–1.00)
GLUCOSE: 108 mg/dL — AB (ref 65–99)
Potassium: 3.6 mmol/L (ref 3.5–5.1)
Sodium: 138 mmol/L (ref 135–145)
TOTAL PROTEIN: 7.5 g/dL (ref 6.5–8.1)
Total Bilirubin: 1.1 mg/dL (ref 0.3–1.2)

## 2017-08-18 LAB — RETICULOCYTES
RBC.: 5.04 MIL/uL (ref 3.87–5.11)
Retic Count, Absolute: 206.6 10*3/uL — ABNORMAL HIGH (ref 19.0–186.0)
Retic Ct Pct: 4.1 % — ABNORMAL HIGH (ref 0.4–3.1)

## 2017-08-18 LAB — CBC WITH DIFFERENTIAL/PLATELET
BASOS ABS: 0 10*3/uL (ref 0.0–0.1)
Basophils Relative: 0 %
EOS ABS: 0.1 10*3/uL (ref 0.0–0.7)
Eosinophils Relative: 1 %
HEMATOCRIT: 35.4 % — AB (ref 36.0–46.0)
Hemoglobin: 13.7 g/dL (ref 12.0–15.0)
LYMPHS ABS: 2.5 10*3/uL (ref 0.7–4.0)
Lymphocytes Relative: 28 %
MCH: 27.2 pg (ref 26.0–34.0)
MCHC: 38.7 g/dL — AB (ref 30.0–36.0)
MCV: 70.2 fL — ABNORMAL LOW (ref 78.0–100.0)
MONOS PCT: 7 %
Monocytes Absolute: 0.6 10*3/uL (ref 0.1–1.0)
NEUTROS ABS: 5.6 10*3/uL (ref 1.7–7.7)
Neutrophils Relative %: 64 %
Platelets: 252 10*3/uL (ref 150–400)
RBC: 5.04 MIL/uL (ref 3.87–5.11)
RDW: 14.6 % (ref 11.5–15.5)
WBC: 8.8 10*3/uL (ref 4.0–10.5)

## 2017-08-18 MED ORDER — OXYCODONE HCL 5 MG PO TABS
10.0000 mg | ORAL_TABLET | Freq: Once | ORAL | Status: AC
  Administered 2017-08-18: 10 mg via ORAL
  Filled 2017-08-18: qty 2

## 2017-08-18 MED ORDER — KETOROLAC TROMETHAMINE 30 MG/ML IJ SOLN
30.0000 mg | Freq: Once | INTRAMUSCULAR | Status: AC
  Administered 2017-08-18: 30 mg via INTRAVENOUS
  Filled 2017-08-18: qty 1

## 2017-08-18 MED ORDER — MORPHINE SULFATE (PF) 4 MG/ML IV SOLN
4.0000 mg | INTRAVENOUS | Status: AC | PRN
  Administered 2017-08-18 (×4): 4 mg via INTRAVENOUS
  Filled 2017-08-18 (×4): qty 1

## 2017-08-18 MED ORDER — ONDANSETRON 4 MG PO TBDP
4.0000 mg | ORAL_TABLET | Freq: Once | ORAL | Status: AC
  Administered 2017-08-18: 4 mg via ORAL
  Filled 2017-08-18: qty 1

## 2017-08-18 MED ORDER — OXYCODONE HCL 10 MG PO TABS: 10.0000 mg | ORAL_TABLET | Freq: Four times a day (QID) | ORAL | 0 refills | Status: DC | PRN

## 2017-08-18 MED ORDER — PROMETHAZINE HCL 25 MG/ML IJ SOLN
12.5000 mg | Freq: Once | INTRAMUSCULAR | Status: AC
  Administered 2017-08-18: 12.5 mg via INTRAVENOUS
  Filled 2017-08-18 (×2): qty 1

## 2017-08-18 MED ORDER — HYDROMORPHONE HCL 1 MG/ML IJ SOLN
0.5000 mg | Freq: Once | INTRAMUSCULAR | Status: AC
  Administered 2017-08-18: 0.5 mg via SUBCUTANEOUS
  Filled 2017-08-18: qty 1

## 2017-08-18 MED ORDER — SODIUM CHLORIDE 0.45 % IV SOLN
INTRAVENOUS | Status: DC
  Administered 2017-08-18: 17:00:00 via INTRAVENOUS

## 2017-08-18 NOTE — Telephone Encounter (Signed)
Patient came into office today for pill count and states that she has no more pills. Per Hannah Hawkins patient was advise to go to ER for leg pain

## 2017-08-18 NOTE — ED Provider Notes (Signed)
[facility]-EMERGENCY DEPT Provider Note   CSN: [id number] Arrival date & time: 08/18/17  1404     History   Chief Complaint Chief Complaint  Patient presents with  . right leg pain  . Sickle Cell Pain Crisis    HPI Hannah Hawkins is a 31 y.o. female.chief complaint is leg pain.  HPI this is a 31 year old female. History of sickle cell [place]. On oxycodone every 8 hours when necessary at home. Does not take daily. 5 ER visits for a CVA related symptoms in last 6 months.  Scribed pain or right upper tibia right lower femur. States this become more of a recurrent issue for her with vaso-occlusive pain. Symptoms last 3-4 days. She was "trying to hold off" stating she had an appointment at the sickle cell clinic today. She was arrived at the clinic was too late for the infusion. Sent here for pain control.  Past Medical History:  Diagnosis Date  . [facility])     Patient Active Problem List   Diagnosis Date Noted  . Sickle cell disease, type [place], with unspecified crisis (HCC) 02/18/2017    Past Surgical History:  Procedure Laterality Date  . EYE SURGERY  03/09/2017   left eye    OB History    No data available       Home Medications    Prior to Admission medications   Medication Sig Start Date End Date Taking? Authorizing Provider  ibuprofen (ADVIL,MOTRIN) 800 MG tablet Take 1 tablet (800 mg total) by mouth every 8 (eight) hours as needed for moderate pain. 06/27/17  Yes Massie MaroonHollis, [name], FNP  naloxegol oxalate (MOVANTIK) 12.5 MG TABS tablet Take 1 tablet (12.5 mg total) by mouth daily. Patient taking differently: Take 12.5 mg by mouth daily as needed (pain).  08/04/17  Yes Massie MaroonHollis, [name], FNP  ondansetron (ZOFRAN) 4 MG tablet Take 1 tablet (4 mg total) by mouth every 8 (eight) hours as needed for nausea or vomiting. 06/14/17  Yes Massie MaroonHollis, [name], FNP  ergocalciferol (VITAMIN D2) 50000 units capsule Take 1 capsule (50,000 Units total) by mouth once a week. 02/22/17    Massie MaroonHollis, [name], FNP  folic acid (FOLVITE) 1 MG tablet Take 1 tablet (1 mg total) by mouth daily. Patient not taking: Reported on 08/18/2017 08/04/17   Massie MaroonHollis, [name], FNP  meclizine (ANTIVERT) 25 MG tablet Take 1 tablet (25 mg total) by mouth 3 (three) times daily as needed for dizziness. Patient not taking: Reported on 08/18/2017 06/14/17   Linwood DibblesKnapp, Jon, MD  Oxycodone HCl 10 MG TABS Take 1 tablet (10 mg total) by mouth every 6 (six) hours as needed. 08/18/17   [name], [name], MD    Family History No family history on file.  Social History Social History  Substance Use Topics  . Smoking status: Former Games developermoker  . Smokeless tobacco: Never Used  . Alcohol use Yes     Comment: Twice a month     Allergies   Penicillins   Review of Systems Review of Systems  Constitutional: Negative for appetite change, chills, diaphoresis, fatigue and fever.  HENT: Negative for mouth sores, sore throat and trouble swallowing.   Eyes: Negative for visual disturbance.  Respiratory: Negative for cough, chest tightness, shortness of breath and wheezing.   Cardiovascular: Negative for chest pain.  Gastrointestinal: Negative for abdominal distention, abdominal pain, diarrhea, nausea and vomiting.  Endocrine: Negative for polydipsia, polyphagia and polyuria.  Genitourinary: Negative for dysuria, frequency and hematuria.  Musculoskeletal: Positive for arthralgias and myalgias. Negative for gait problem.  Skin: Negative for color change, pallor and rash.  Neurological: Negative for dizziness, syncope, light-headedness and headaches.  Hematological: Does not bruise/bleed easily.  Psychiatric/Behavioral: Negative for behavioral problems and confusion.     Physical Exam Updated Vital Signs BP 111/73 (BP Location: Left Arm)   Pulse 71   Temp (!) 97.5 F (36.4 C) (Oral)   Resp 14   Ht 5\' 3"  (1.6 m)   Wt 63 kg (139 lb)   LMP 08/12/2017   SpO2 100%   BMI 24.62 kg/m   Physical Exam    Constitutional: She is oriented to person, place, and time. She appears well-developed and well-nourished. No distress.  HENT:  Head: Normocephalic.  Eyes: Pupils are equal, round, and reactive to light. Conjunctivae are normal. No scleral icterus.  Neck: Normal range of motion. Neck supple. No thyromegaly present.  Cardiovascular: Normal rate and regular rhythm.  Exam reveals no gallop and no friction rub.   No murmur heard. Pulmonary/Chest: Effort normal and breath sounds normal. No respiratory distress. She has no wheezes. She has no rales.  Abdominal: Soft. Bowel sounds are normal. She exhibits no distension. There is no tenderness. There is no rebound.  Musculoskeletal: Normal range of motion.  Pain primarily in the upper tibia. No increase in size of the right leg versus left. No cording swelling. No focal muscular pain or tenderness. Exam not suggestive of DVT or muscle pain.  Neurological: She is alert and oriented to person, place, and time.  Skin: Skin is warm and dry. No rash noted.  Psychiatric: She has a normal mood and affect. Her behavior is normal.     ED Treatments / Results  Labs (all labs ordered are listed, but only abnormal results are displayed) Labs Reviewed  COMPREHENSIVE METABOLIC PANEL - Abnormal; Notable for the following:       Result Value   Glucose, Bld 108 (*)    All other components within normal limits  CBC WITH DIFFERENTIAL/PLATELET - Abnormal; Notable for the following:    HCT 35.4 (*)    MCV 70.2 (*)    MCHC 38.7 (*)    All other components within normal limits  RETICULOCYTES - Abnormal; Notable for the following:    Retic Ct Pct 4.1 (*)    Retic Count, Absolute 206.6 (*)    All other components within normal limits    EKG  EKG Interpretation None       Radiology No results found.  Procedures Procedures (including critical care time)  Medications Ordered in ED Medications  0.45 % sodium chloride infusion ( Intravenous New  Bag/Given 08/18/17 1724)  oxyCODONE (Oxy IR/ROXICODONE) immediate release tablet 10 mg (not administered)  HYDROmorphone (DILAUDID) injection 0.5 mg (0.5 mg Subcutaneous Given 08/18/17 1427)  ondansetron (ZOFRAN-ODT) disintegrating tablet 4 mg (4 mg Oral Given 08/18/17 1425)  morphine 4 MG/ML injection 4 mg (4 mg Intravenous Given 08/18/17 1907)  promethazine (PHENERGAN) injection 12.5 mg (12.5 mg Intravenous Given 08/18/17 1839)  ketorolac (TORADOL) 30 MG/ML injection 30 mg (30 mg Intravenous Given 08/18/17 1727)     Initial Impression / Assessment and Plan / ED Course  I have reviewed the triage vital signs and the nursing notes.  Pertinent labs & imaging results that were available during my care of the patient were reviewed by me and considered in my medical decision making (see chart for details).   iV placed preliminary labs reassuring. Hemoglobin 13. Given fluids  at maintenance. Given Toradol. Given Phenergan. Given morphine 4 mg 3 doses. Had improvement in her pain. Given first dose by mouth Percocet and she is out of her prescription states she is due to get it refilled tomorrow. She is appropriate for outpatient treatment.  Final Clinical Impressions(s) / ED Diagnoses   Final diagnoses:  Sickle cell pain crisis [facility])    New Prescriptions New Prescriptions   No medications on file     Rolland Porter, MD 08/18/17 1944

## 2017-08-18 NOTE — ED Triage Notes (Signed)
Patient c/o right leg sickle cell pain x 3 days. Patient reports stabbing pain and then will have flare ups.

## 2017-08-18 NOTE — Discharge Instructions (Signed)
Refill, and resume medications tomorrow as directed and prescribed.

## 2017-08-19 ENCOUNTER — Non-Acute Institutional Stay (HOSPITAL_COMMUNITY)
Admission: AD | Admit: 2017-08-19 | Discharge: 2017-08-19 | Disposition: A | Discharge status: Home / Self Care | Payer: Medicaid Other | Source: Ambulatory Visit | Attending: Internal Medicine | Admitting: Internal Medicine

## 2017-08-19 ENCOUNTER — Encounter (HOSPITAL_COMMUNITY): Payer: Self-pay

## 2017-08-19 ENCOUNTER — Telehealth (HOSPITAL_COMMUNITY): Payer: Self-pay | Admitting: *Deleted

## 2017-08-19 DIAGNOSIS — Z79899 Other long term (current) drug therapy: Secondary | ICD-10-CM | POA: Insufficient documentation

## 2017-08-19 DIAGNOSIS — D57219 Sickle-cell/Hb-C disease with crisis, unspecified: Secondary | ICD-10-CM

## 2017-08-19 DIAGNOSIS — Z87891 Personal history of nicotine dependence: Secondary | ICD-10-CM | POA: Insufficient documentation

## 2017-08-19 DIAGNOSIS — Z880 Allergy status to penicillin: Secondary | ICD-10-CM | POA: Diagnosis not present

## 2017-08-19 DIAGNOSIS — D5720 Sickle-cell/Hb-C disease without crisis: Secondary | ICD-10-CM | POA: Diagnosis present

## 2017-08-19 MED ORDER — KETOROLAC TROMETHAMINE 30 MG/ML IJ SOLN
30.0000 mg | Freq: Once | INTRAMUSCULAR | Status: AC
  Administered 2017-08-19: 30 mg via INTRAVENOUS
  Filled 2017-08-19: qty 1

## 2017-08-19 MED ORDER — SODIUM CHLORIDE 0.9% FLUSH: 9.0000 mL | INTRAVENOUS | Status: DC | PRN

## 2017-08-19 MED ORDER — DIPHENHYDRAMINE HCL 25 MG PO CAPS
25.0000 mg | ORAL_CAPSULE | ORAL | Status: DC | PRN
  Administered 2017-08-19: 25 mg via ORAL
  Filled 2017-08-19: qty 1

## 2017-08-19 MED ORDER — NALOXONE HCL 0.4 MG/ML IJ SOLN: 0.4000 mg | INTRAMUSCULAR | Status: DC | PRN

## 2017-08-19 MED ORDER — SODIUM CHLORIDE 0.9 % IV SOLN
25.0000 mg | INTRAVENOUS | Status: DC | PRN
  Filled 2017-08-19: qty 0.5

## 2017-08-19 MED ORDER — DEXTROSE-NACL 5-0.45 % IV SOLN
INTRAVENOUS | Status: DC
  Administered 2017-08-19: 13:00:00 via INTRAVENOUS

## 2017-08-19 MED ORDER — ONDANSETRON HCL 4 MG/2ML IJ SOLN
4.0000 mg | Freq: Four times a day (QID) | INTRAMUSCULAR | Status: DC | PRN
  Administered 2017-08-19: 4 mg via INTRAVENOUS
  Filled 2017-08-19: qty 2

## 2017-08-19 MED ORDER — HYDROMORPHONE 1 MG/ML IV SOLN
INTRAVENOUS | Status: DC
  Administered 2017-08-19: 13:00:00 via INTRAVENOUS
  Administered 2017-08-19: 6 mg via INTRAVENOUS
  Filled 2017-08-19: qty 25

## 2017-08-19 NOTE — H&P (Signed)
[facility] History and Physical   Date: 08/19/2017  Patient name: Hannah Hawkins Hannah Hawkins Medical record number: [id number] Date of birth: 17-Mar-1986 Age: 31 y.o. Gender: female PCP: Massie MaroonHollis, [name], FNP  Attending physician: [name], [name], MD  Chief Complaint: Generalized Pain   History of Present Illness: Hannah Hawkins Hannah Hawkins is a 31 y.o. female with a diagnosis of Sickle Cell Anemia, presents today with a complaint of lower extremity and low back pain , consist with her typical sickle cell crisis pain. Hannah Hawkins [facility] for similar pain yesterday, however she did not have her prescription for home pain medication and continued to have pain today. Her current pain intensity is a 9/10, which she characterizes as mostly aching.  Denies headache, fever, shortness of breath, chest pain, dysuria, nausea, vomiting, or diarrhea.   Hannah Hawkins is being admitted to the day infusion center for extended observation. Meds: Prescriptions Prior to Admission  Medication Sig Dispense Refill Last Dose  . ergocalciferol (VITAMIN D2) 50000 units capsule Take 1 capsule (50,000 Units total) by mouth once a week. 12 capsule 3 08/19/2017 at Unknown time  . ibuprofen (ADVIL,MOTRIN) 800 MG tablet Take 1 tablet (800 mg total) by mouth every 8 (eight) hours as needed for moderate pain. 30 tablet 5 08/19/2017 at Unknown time  . ondansetron (ZOFRAN) 4 MG tablet Take 1 tablet (4 mg total) by mouth every 8 (eight) hours as needed for nausea or vomiting. 30 tablet 2 08/18/2017 at Unknown time  . Oxycodone HCl 10 MG TABS Take 1 tablet (10 mg total) by mouth every 6 (six) hours as needed. 60 tablet 0 08/18/2017 at Unknown time  . folic acid (FOLVITE) 1 MG tablet Take 1 tablet (1 mg total) by mouth daily. (Patient not taking: Reported on 08/18/2017) 30 tablet 11 Unknown at Unknown time  . meclizine (ANTIVERT) 25 MG tablet Take 1 tablet (25 mg total) by mouth 3 (three)  times daily as needed for dizziness. (Patient not taking: Reported on 08/18/2017) 30 tablet 0 Unknown at Unknown time  . naloxegol oxalate (MOVANTIK) 12.5 MG TABS tablet Take 1 tablet (12.5 mg total) by mouth daily. (Patient taking differently: Take 12.5 mg by mouth daily as needed (pain). ) 30 tablet 2 Unknown at Unknown time    Allergies: Penicillins Past Medical History:  Diagnosis Date  . Sickle cell anemia (HCC)    Past Surgical History:  Procedure Laterality Date  . EYE SURGERY  03/09/2017   left eye   History reviewed. No pertinent family history. Social History   Social History  . Marital status: Single    Spouse name: N/A  . Number of children: N/A  . Years of education: N/A   Occupational History  . Not on file.   Social History Main Topics  . Smoking status: Former Games developermoker  . Smokeless tobacco: Never Used  . Alcohol use Yes     Comment: Twice a month  . Drug use: No  . Sexual activity: Yes   Other Topics Concern  . Not on file   Social History Narrative  . No narrative on file    Review of Systems: Review of Systems  Cardiovascular ROS: no chest pain or dyspnea on exertion Gastrointestinal ROS: no abdominal pain, change in bowel habits, or black or bloody stools Musculoskeletal ROS: positive for - muscle pain   Physical Exam: Blood pressure 133/80, pulse 84, temperature (!) 97.5 F (36.4 C), temperature source Oral, resp. rate 12, weight  140 lb (63.5 kg), last menstrual period 08/12/2017, SpO2 95 %.  General Appearance:    Alert, cooperative, no distress, appears stated age  Head:    Normocephalic, without obvious abnormality, atraumatic  Eyes:    PERRL, scleral icterus, EOM's intact  Back:     Symmetric, no curvature, ROM normal, no CVA tenderness  Lungs:     Clear to auscultation bilaterally, respirations unlabored   Heart:    Regular rate and rhythm, S1 and S2 normal,   Abdomen:     Soft, non-tender, bowel sounds active all four quadrants,    no  masses, no organomegaly  Extremities:   Extremities normal, atraumatic, no cyanosis or edema  Pulses:   2+ and symmetric all extremities  Skin:   Skin color, texture, turgor normal, no rashes or lesions  Neurologic:   Normal strength, sensation    Imaging results:  Pending    Assessment & Plan:  Patient will be admitted to the day infusion center for extended observation  Start IV D5.45 for cellular rehydration at 125/hr  Start Toradol 30 mg IV every 6 hours for inflammation.  Start Dilaudid PCA High Concentration per weight based protocol.   Patient will be re-evaluated for pain intensity in the context of function and relationship to baseline as care progresses.  If no significant pain relief, will transfer patient to inpatient services for a higher level of care.   Will check CMP,  Reticulocytes and CBC w/differential   Hannah Hawkins 08/19/2017, 12:31 PM

## 2017-08-19 NOTE — Progress Notes (Signed)
Pt discharged to home; discharge instructions explained, given, and signed; all questions answered; pt alert, oriented, and ambulatory; no complications noted 

## 2017-08-19 NOTE — Telephone Encounter (Signed)
Pt called requesting treatment for sickle cell pain to R leg 9/10, lasting 5 days. Was treated in the ED yesterday but began having pain again around 0100 this morning. Taking ibuprofen but out of her other pain medications at this time - [facility]. Provider stated she can come for treatment; patient notified. Stated she will be here by Lady [facility]10am.  Cici Rodriges Ann, RN 08/19/2017

## 2017-08-19 NOTE — Discharge Summary (Signed)
[facility] Discharge Summary   Patient ID: Jackolyn ConferBeatrice Cadman MRN: [id number] DOB/AGE: 33/29/1987 33 y.o.  Admit date: 08/19/2017 Discharge date: 08/19/2017  Primary Care Physician:  Massie MaroonHollis, [name], FNP  Admission Diagnoses:  Active Problems:   Sickle cell disease, type [place], with unspecified crisis [facility](HCC)   Discharge Medications:  Allergies as of 08/19/2017      Reactions   Penicillins Swelling, Rash   Has patient had a PCN reaction causing immediate rash, facial/tongue/throat swelling, SOB or lightheadedness with hypotension: Yes Has patient had a PCN reaction causing severe rash involving mucus membranes or skin necrosis: Yes Has patient had a PCN reaction that required hospitalization No Has patient had a PCN reaction occurring within the last 10 years: no If all of the above answers are "NO", then may proceed with Cephalosporin use.      Medication List    TAKE these medications   ergocalciferol 50000 units capsule Commonly known as:  VITAMIN D2 Take 1 capsule (50,000 Units total) by mouth once a week.   folic acid 1 MG tablet Commonly known as:  FOLVITE Take 1 tablet (1 mg total) by mouth daily.   ibuprofen 800 MG tablet Commonly known as:  ADVIL,MOTRIN Take 1 tablet (800 mg total) by mouth every 8 (eight) hours as needed for moderate pain.   meclizine 25 MG tablet Commonly known as:  ANTIVERT Take 1 tablet (25 mg total) by mouth 3 (three) times daily as needed for dizziness.   naloxegol oxalate 12.5 MG Tabs tablet Commonly known as:  MOVANTIK Take 1 tablet (12.5 mg total) by mouth daily. What changed:  when to take this  reasons to take this   ondansetron 4 MG tablet Commonly known as:  ZOFRAN Take 1 tablet (4 mg total) by mouth every 8 (eight) hours as needed for nausea or vomiting.   Oxycodone HCl 10 MG Tabs Take 1 tablet (10 mg total) by mouth every 6 (six) hours as needed.        Consults:  N/A  Significant Diagnostic Studies:   Results for orders placed or performed during the hospital encounter of 08/18/17  Comprehensive metabolic panel  Result Value Ref Range   Sodium 138 135 - 145 mmol/L   Potassium 3.6 3.5 - 5.1 mmol/L   Chloride 107 101 - 111 mmol/L   CO2 25 22 - 32 mmol/L   Glucose, Bld 108 (H) 65 - 99 mg/dL   BUN 10 6 - 20 mg/dL   Creatinine, Ser 4.690.80 0.44 - 1.00 mg/dL   Calcium 9.1 8.9 - 62.910.3 mg/dL   Total Protein 7.5 6.5 - 8.1 g/dL   Albumin 4.2 3.5 - 5.0 g/dL   AST 21 15 - 41 U/L   ALT 14 14 - 54 U/L   Alkaline Phosphatase 42 38 - 126 U/L   Total Bilirubin 1.1 0.3 - 1.2 mg/dL   GFR calc non Af Amer >60 >60 mL/min   GFR calc Af Amer >60 >60 mL/min   Anion gap 6 5 - 15  CBC with Differential  Result Value Ref Range   WBC 8.8 4.0 - 10.5 K/uL   RBC 5.04 3.87 - 5.11 MIL/uL   Hemoglobin 13.7 12.0 - 15.0 g/dL   HCT 52.835.4 (L) 41.336.0 - 24.446.0 %   MCV 70.2 (L) 78.0 - 100.0 fL   MCH 27.2 26.0 - 34.0 pg   MCHC 38.7 (H) 30.0 - 36.0 g/dL   RDW 01.014.6 27.211.5 - 53.615.5 %   Platelets  252 150 - 400 K/uL   Neutrophils Relative % 64 %   Lymphocytes Relative 28 %   Monocytes Relative 7 %   Eosinophils Relative 1 %   Basophils Relative 0 %   Neutro Abs 5.6 1.7 - 7.7 K/uL   Lymphs Abs 2.5 0.7 - 4.0 K/uL   Monocytes Absolute 0.6 0.1 - 1.0 K/uL   Eosinophils Absolute 0.1 0.0 - 0.7 K/uL   Basophils Absolute 0.0 0.0 - 0.1 K/uL   RBC Morphology TARGET CELLS   Reticulocytes  Result Value Ref Range   Retic Ct Pct 4.1 (H) 0.4 - 3.1 %   RBC. 5.04 3.87 - 5.11 MIL/uL   Retic Count, Absolute 206.6 (H) 19.0 - 186.0 K/uL     [facility] Course: Chesley Valls is a 31 y.o. female with a diagnosis of Sickle Cell Anemia, presents today with a complaint of lower extremity and low back pain , consist with her typical sickle cell crisis pain. Arlinda was evaluated and treated at [facility] for similar pain yesterday, however she did not have her prescription for home pain medication and  continued to have pain today. Her current pain intensity is a 9/10, which she characterizes as mostly aching.  Denies headache, fever, shortness of breath, chest pain, dysuria, nausea, vomiting, or diarrhea.   Miguelina is being admitted to the day infusion center for extended observation. Intravenous D5.45 @ 125 cc/hr administer for cellular rehydration. Toradol 30 mg IV to reduce inflammation. Placed on a High Concentration PCA per weight based protocol for pain control. Patient used a total of 6 mg of hydromorphone. Patient states that pain intensity is 7/10, reduced from 9/10 on admission. Reviewed laboratory values, consistent with baseline. Patient is alert, oriented and ambulatory.    Physical Exam at Discharge:  BP 133/80 (BP Location: Left Arm)   Pulse 84   Temp (!) 97.5 F (36.4 C) (Oral)   Resp 12   Wt 140 lb (63.5 kg)   LMP 08/12/2017   SpO2 95%   BMI 24.80 kg/m  General Appearance:    Alert, cooperative, no distress, appears stated age  Head:    Normocephalic, without obvious abnormality, atraumatic  Eyes:    PERRL, scleral icterus, EOM's intact  Back:     Symmetric, no curvature, ROM normal, no CVA tenderness  Lungs:     Clear to auscultation bilaterally, respirations unlabored   Heart:    Regular rate and rhythm, S1 and S2 normal,   Abdomen:     Soft, non-tender, bowel sounds active all four quadrants,    no masses, no organomegaly  Extremities:   Extremities normal, atraumatic, no cyanosis or edema  Pulses:   2+ and symmetric all extremities  Skin:   Skin color, texture, turgor normal, no rashes or lesions  Neurologic:   Normal strength, sensation    Disposition at Discharge: 01-Home or Self Care  Discharge Orders:  -Continue to hydrate and take prescribed home medications as ordered. -Resume all home medications. -Keep upcoming appointment  -The patient was given clear instructions to go to ER or return to medical center if symptoms do not improve, worsen or  new problems develop. The patient verbalized understanding.  Condition at Discharge:   Stable  Time spent on Discharge:  Greater than 25 minutes.  Signed: Joaquin Courts 08/19/2017, 4:24 PM

## 2017-09-07 ENCOUNTER — Ambulatory Visit: Payer: Medicaid Other | Admitting: Family Medicine

## 2017-09-08 ENCOUNTER — Other Ambulatory Visit: Payer: Self-pay | Admitting: Internal Medicine

## 2017-09-08 DIAGNOSIS — D57219 Sickle-cell/Hb-C disease with crisis, unspecified: Secondary | ICD-10-CM

## 2017-09-08 DIAGNOSIS — Z79891 Long term (current) use of opiate analgesic: Secondary | ICD-10-CM

## 2017-09-08 NOTE — Telephone Encounter (Signed)
Patient oxycodone refill request

## 2017-09-13 ENCOUNTER — Other Ambulatory Visit: Payer: Self-pay | Admitting: Internal Medicine

## 2017-09-13 ENCOUNTER — Telehealth: Payer: Self-pay

## 2017-09-13 DIAGNOSIS — Z79891 Long term (current) use of opiate analgesic: Secondary | ICD-10-CM

## 2017-09-13 DIAGNOSIS — D57219 Sickle-cell/Hb-C disease with crisis, unspecified: Secondary | ICD-10-CM

## 2017-09-13 MED ORDER — OXYCODONE HCL 10 MG PO TABS: 10.0000 mg | ORAL_TABLET | Freq: Four times a day (QID) | ORAL | 0 refills | Status: DC | PRN

## 2017-09-19 ENCOUNTER — Ambulatory Visit: Payer: Medicaid Other | Admitting: Family Medicine

## 2017-09-28 ENCOUNTER — Encounter (HOSPITAL_COMMUNITY): Payer: Self-pay | Admitting: *Deleted

## 2017-09-28 ENCOUNTER — Non-Acute Institutional Stay (HOSPITAL_COMMUNITY)
Admission: AD | Admit: 2017-09-28 | Discharge: 2017-09-28 | Disposition: A | Discharge status: Home / Self Care | Payer: Medicaid Other | Source: Ambulatory Visit | Attending: Internal Medicine | Admitting: Internal Medicine

## 2017-09-28 ENCOUNTER — Other Ambulatory Visit: Payer: Self-pay | Admitting: Family Medicine

## 2017-09-28 ENCOUNTER — Ambulatory Visit (HOSPITAL_COMMUNITY)
Admission: RE | Admit: 2017-09-28 | Discharge: 2017-09-28 | Disposition: A | Discharge status: Home / Self Care | Payer: Medicaid Other | Source: Ambulatory Visit | Attending: Family Medicine | Admitting: Family Medicine

## 2017-09-28 DIAGNOSIS — M79604 Pain in right leg: Secondary | ICD-10-CM

## 2017-09-28 DIAGNOSIS — D57219 Sickle-cell/Hb-C disease with crisis, unspecified: Secondary | ICD-10-CM

## 2017-09-28 DIAGNOSIS — Z880 Allergy status to penicillin: Secondary | ICD-10-CM | POA: Insufficient documentation

## 2017-09-28 DIAGNOSIS — D5720 Sickle-cell/Hb-C disease without crisis: Secondary | ICD-10-CM | POA: Diagnosis not present

## 2017-09-28 DIAGNOSIS — Z79899 Other long term (current) drug therapy: Secondary | ICD-10-CM | POA: Insufficient documentation

## 2017-09-28 DIAGNOSIS — Z87891 Personal history of nicotine dependence: Secondary | ICD-10-CM | POA: Insufficient documentation

## 2017-09-28 LAB — CBC WITH DIFFERENTIAL/PLATELET
BASOS ABS: 0.1 10*3/uL (ref 0.0–0.1)
BASOS PCT: 1 %
EOS ABS: 0.3 10*3/uL (ref 0.0–0.7)
Eosinophils Relative: 6 %
HCT: 31.9 % — ABNORMAL LOW (ref 36.0–46.0)
Hemoglobin: 11.7 g/dL — ABNORMAL LOW (ref 12.0–15.0)
LYMPHS ABS: 1.8 10*3/uL (ref 0.7–4.0)
LYMPHS PCT: 34 %
MCH: 27 pg (ref 26.0–34.0)
MCHC: 36.7 g/dL — ABNORMAL HIGH (ref 30.0–36.0)
MCV: 73.5 fL — ABNORMAL LOW (ref 78.0–100.0)
MONO ABS: 0.5 10*3/uL (ref 0.1–1.0)
Monocytes Relative: 10 %
NEUTROS PCT: 49 %
NRBC: 5 /100{WBCs} — AB
Neutro Abs: 2.7 10*3/uL (ref 1.7–7.7)
PLATELETS: 334 10*3/uL (ref 150–400)
RBC: 4.34 MIL/uL (ref 3.87–5.11)
RDW: 14.7 % (ref 11.5–15.5)
WBC: 5.4 10*3/uL (ref 4.0–10.5)

## 2017-09-28 LAB — RETICULOCYTES
RBC.: 4.34 MIL/uL (ref 3.87–5.11)
Retic Count, Absolute: 204 10*3/uL — ABNORMAL HIGH (ref 19.0–186.0)
Retic Ct Pct: 4.7 % — ABNORMAL HIGH (ref 0.4–3.1)

## 2017-09-28 LAB — COMPREHENSIVE METABOLIC PANEL
ALT: 20 U/L (ref 14–54)
AST: 36 U/L (ref 15–41)
Albumin: 4.1 g/dL (ref 3.5–5.0)
Alkaline Phosphatase: 49 U/L (ref 38–126)
Anion gap: 6 (ref 5–15)
BUN: 7 mg/dL (ref 6–20)
CHLORIDE: 108 mmol/L (ref 101–111)
CO2: 27 mmol/L (ref 22–32)
CREATININE: 0.86 mg/dL (ref 0.44–1.00)
Calcium: 9 mg/dL (ref 8.9–10.3)
GFR calc non Af Amer: >60 mL/min (ref >60)
Glucose, Bld: 86 mg/dL (ref 65–99)
Potassium: 4.1 mmol/L (ref 3.5–5.1)
SODIUM: 141 mmol/L (ref 135–145)
Total Bilirubin: 0.6 mg/dL (ref 0.3–1.2)
Total Protein: 7.4 g/dL (ref 6.5–8.1)

## 2017-09-28 MED ORDER — NALOXONE HCL 0.4 MG/ML IJ SOLN: 0.4000 mg | INTRAMUSCULAR | Status: DC | PRN

## 2017-09-28 MED ORDER — KETOROLAC TROMETHAMINE 30 MG/ML IJ SOLN: 30.0000 mg | Freq: Once | INTRAMUSCULAR | Status: DC

## 2017-09-28 MED ORDER — DEXTROSE-NACL 5-0.45 % IV SOLN
INTRAVENOUS | Status: DC
  Administered 2017-09-28: 12:00:00 via INTRAVENOUS

## 2017-09-28 MED ORDER — DIPHENHYDRAMINE HCL 25 MG PO CAPS
25.0000 mg | ORAL_CAPSULE | ORAL | Status: DC | PRN
  Administered 2017-09-28 (×2): 25 mg via ORAL
  Filled 2017-09-28 (×2): qty 1

## 2017-09-28 MED ORDER — ONDANSETRON HCL 4 MG/2ML IJ SOLN
4.0000 mg | Freq: Four times a day (QID) | INTRAMUSCULAR | Status: DC | PRN
  Administered 2017-09-28: 4 mg via INTRAVENOUS
  Filled 2017-09-28: qty 2

## 2017-09-28 MED ORDER — KETOROLAC TROMETHAMINE 30 MG/ML IJ SOLN
30.0000 mg | Freq: Once | INTRAMUSCULAR | Status: AC
  Administered 2017-09-28: 30 mg via INTRAVENOUS
  Filled 2017-09-28: qty 1

## 2017-09-28 MED ORDER — SODIUM CHLORIDE 0.9% FLUSH: 9.0000 mL | INTRAVENOUS | Status: DC | PRN

## 2017-09-28 MED ORDER — SODIUM CHLORIDE 0.9 % IV SOLN
25.0000 mg | INTRAVENOUS | Status: DC | PRN
  Filled 2017-09-28: qty 0.5

## 2017-09-28 MED ORDER — HYDROMORPHONE 1 MG/ML IV SOLN
INTRAVENOUS | Status: DC
  Administered 2017-09-28: 6.5 mg via INTRAVENOUS
  Administered 2017-09-28: 13:00:00 via INTRAVENOUS
  Filled 2017-09-28: qty 25

## 2017-09-28 NOTE — Progress Notes (Signed)
Hannah Hawkins seen in the day infusion center today complains of right leg pain. DG tibia right ordered.  [name] [name], MSN, FNP-C [facility]  [address] Sherian Maroon [place], [place] [address] [phone]

## 2017-09-28 NOTE — Progress Notes (Signed)
Patient admitted to the Patient Care Center c/o right leg pain. Upon admission patient's pain level was a 10/10 on pain scale. Patient placed on a Dilaudid PCA and treated with IV fluids, IV Toradol and Zofran. At time of discharge patient's pain was down to a 3/10 on pain scale. Discharge instructions given to patient and patient states an understanding. Patient alert, oriented, and ambulatory at time of discharge.

## 2017-09-28 NOTE — H&P (Signed)
[facility] History and Physical   Date: 09/29/2017  Patient name: Hannah Hawkins Medical record number: [id number] Date of birth: 03/12/1986 Age: 31 y.o. Gender: female PCP: Massie Maroon, FNP  Attending physician: No att. providers found  Chief Complaint: General Pain   History of Present Illness:  Hannah Hawkins is a 31 y.o. female with a diagnosis of Sickle Cell Anemia, presents today with a complaint of generalized pain with increased pain to right lower leg. Hannah Hawkins reports initial onset of pain occurred over last two days. Reports current pain intensity is 10/10 and she characterizes pain as aching. She reports that she is out of pain medication. Hannah Hawkins at present, denies headache, fever, shortness of breath, chest pain, dysuria, nausea, vomiting, or diarrhea. She is being admitted to the day infusion for extended observation and pain management.   Meds: Allergies as of 09/28/2017      Reactions   Penicillins Swelling, Rash   Has patient had a PCN reaction causing immediate rash, facial/tongue/throat swelling, SOB or lightheadedness with hypotension: Yes Has patient had a PCN reaction causing severe rash involving mucus membranes or skin necrosis: Yes Has patient had a PCN reaction that required hospitalization No Has patient had a PCN reaction occurring within the last 10 years: no If all of the above answers are "NO", then may proceed with Cephalosporin use.      Medication List    TAKE these medications   ergocalciferol 50000 units capsule Commonly known as:  VITAMIN D2 Take 1 capsule (50,000 Units total) by mouth once a week.   folic acid 1 MG tablet Commonly known as:  FOLVITE Take 1 tablet (1 mg total) by mouth daily.   ibuprofen 800 MG tablet Commonly known as:  ADVIL,MOTRIN Take 1 tablet (800 mg total) by mouth every 8 (eight) hours as needed for moderate pain.   meclizine 25 MG tablet Commonly known as:  ANTIVERT Take 1 tablet (25  mg total) by mouth 3 (three) times daily as needed for dizziness.   naloxegol oxalate 12.5 MG Tabs tablet Commonly known as:  MOVANTIK Take 1 tablet (12.5 mg total) by mouth daily. What changed:  when to take this  reasons to take this   ondansetron 4 MG tablet Commonly known as:  ZOFRAN Take 1 tablet (4 mg total) by mouth every 8 (eight) hours as needed for nausea or vomiting.   Oxycodone HCl 10 MG Tabs Take 1 tablet (10 mg total) by mouth every 6 (six) hours as needed.      Allergies: Penicillins Past Medical History:  Diagnosis Date  . Sickle cell anemia (HCC)    Past Surgical History:  Procedure Laterality Date  . EYE SURGERY  03/09/2017   left eye   History reviewed. No pertinent family history. Social History   Social History  . Marital status: Single    Spouse name: N/A  . Number of children: N/A  . Years of education: N/A   Occupational History  . Not on file.   Social History Main Topics  . Smoking status: Former Games developer  . Smokeless tobacco: Never Used  . Alcohol use Yes     Comment: Twice a month  . Drug use: No  . Sexual activity: Yes   Other Topics Concern  . Not on file   Social History Narrative  . No narrative on file   Review of Systems: Constitutional: negative Cardiovascular: negative Gastrointestinal: negative Musculoskeletal:positive for arthralgias Neurological: negative  Physical Exam: Blood pressure 108/76,  pulse 78, temperature 97.6 F (36.4 C), temperature source Oral, resp. rate 14, height  (1.6 m), weight 140 lb (63.5 kg), last menstrual period 09/19/2017, SpO2 100 %. General Appearance:    Alert, cooperative, no distress, appears stated age  Head:    Normocephalic, without obvious abnormality, atraumatic  Eyes:    PERRL, conjunctiva/corneas clear, EOM's intact  Back:     Symmetric, no curvature, ROM normal, no CVA tenderness  Lungs:     Clear to auscultation bilaterally, respirations unlabored  Chest Wall:    No  tenderness or deformity   Heart:    Regular rate and rhythm, S1 and S2 normal, no murmur, rub   or gallop  Abdomen:     Soft, non-tender, bowel sounds active all four quadrants,    no masses, no organomegaly  Extremities:   Extremities normal, atraumatic, no cyanosis or edema  Neurologic:   Normal strength, sensation    Lab results: Results for orders placed or performed during the hospital encounter of 09/28/17 (from the past 24 hour(s))  CBC with Differential/Platelet     Status: Abnormal   Collection Time: 09/28/17 12:15 PM  Result Value Ref Range   WBC 5.4 4.0 - 10.5 K/uL   RBC 4.34 3.87 - 5.11 MIL/uL   Hemoglobin 11.7 (L) 12.0 - 15.0 g/dL   HCT 47.8 (L) 29.5 - 62.1 %   MCV 73.5 (L) 78.0 - 100.0 fL   MCH 27.0 26.0 - 34.0 pg   MCHC 36.7 (H) 30.0 - 36.0 g/dL   RDW 30.8 65.7 - 84.6 %   Platelets 334 150 - 400 K/uL   Neutrophils Relative % 49 %   Lymphocytes Relative 34 %   Monocytes Relative 10 %   Eosinophils Relative 6 %   Basophils Relative 1 %   nRBC 5 (H) 0 /100 WBC   Neutro Abs 2.7 1.7 - 7.7 K/uL   Lymphs Abs 1.8 0.7 - 4.0 K/uL   Monocytes Absolute 0.5 0.1 - 1.0 K/uL   Eosinophils Absolute 0.3 0.0 - 0.7 K/uL   Basophils Absolute 0.1 0.0 - 0.1 K/uL   RBC Morphology TARGET CELLS   Comprehensive metabolic panel     Status: None   Collection Time: 09/28/17 12:15 PM  Result Value Ref Range   Sodium 141 135 - 145 mmol/L   Potassium 4.1 3.5 - 5.1 mmol/L   Chloride 108 101 - 111 mmol/L   CO2 27 22 - 32 mmol/L   Glucose, Bld 86 65 - 99 mg/dL   BUN 7 6 - 20 mg/dL   Creatinine, Ser 9.62 0.44 - 1.00 mg/dL   Calcium 9.0 8.9 - 95.2 mg/dL   Total Protein 7.4 6.5 - 8.1 g/dL   Albumin 4.1 3.5 - 5.0 g/dL   AST 36 15 - 41 U/L   ALT 20 14 - 54 U/L   Alkaline Phosphatase 49 38 - 126 U/L   Total Bilirubin 0.6 0.3 - 1.2 mg/dL   GFR calc non Af Amer >60 >60 mL/min   GFR calc Af Amer >60 >60 mL/min   Anion gap 6 5 - 15  Reticulocytes     Status: Abnormal   Collection Time:  09/28/17 12:15 PM  Result Value Ref Range   Retic Ct Pct 4.7 (H) 0.4 - 3.1 %   RBC. 4.34 3.87 - 5.11 MIL/uL   Retic Count, Absolute 204.0 (H) 19.0 - 186.0 K/uL    Imaging results:  Pending    Assessment & Plan:  Patient will be admitted to the day infusion center for extended observation  Start IV D5.45 for cellular rehydration at 125/hr  Start Toradol 30 mg IV every 6 hours for inflammation.  Start Dilaudid PCA High Concentration per weight based protocol.   Patient will be re-evaluated for pain intensity in the context of function and relationship to baseline as care progresses.  If no significant pain relief, will transfer patient to inpatient services for a higher level of care.   Will check CMP, CBC w/differential, Reticulocyte count   Joaquin Courts 09/29/2017, 7:00 AM

## 2017-09-28 NOTE — Discharge Instructions (Signed)
Schedule a follow-up with your PCP for sickle follow-up. Do down to [facility] radiology to obtain xray of your right leg after discharge. You will be notified of your results.     Sickle Cell Anemia, Adult Sickle cell anemia is a condition where your red blood cells are shaped like sickles. Red blood cells carry oxygen through the body. Sickle-shaped red blood cells do not live as long as normal red blood cells. They also clump together and block blood from flowing through the blood vessels. These things prevent the body from getting enough oxygen. Sickle cell anemia causes organ damage and pain. It also increases the risk of infection. Follow these instructions at home:  Drink enough fluid to keep your pee (urine) clear or pale yellow. Drink more in hot weather and during exercise.  Do not smoke. Smoking lowers oxygen levels in the blood.  Only take over-the-counter or prescription medicines as told by your doctor.  Take antibiotic medicines as told by your doctor. Make sure you finish them even if you start to feel better.  Take supplements as told by your doctor.  Consider wearing a medical alert bracelet. This tells anyone caring for you in an emergency of your condition.  When traveling, keep your medical information, doctors' names, and the medicines you take with you at all times.  If you have a fever, do not take fever medicines right away. This could cover up a problem. Tell your doctor.  Keep all follow-up visits with your doctor. Sickle cell anemia requires regular medical care. Contact a doctor if: You have a fever. Get help right away if:  You feel dizzy or faint.  You have new belly (abdominal) pain, especially on the left side near the stomach area.  You have a lasting, often uncomfortable and painful erection of the penis (priapism). If it is not treated right away, you will become unable to have sex (impotence).  You have numbness in your arms or legs or you have  a hard time moving them.  You have a hard time talking.  You have a fever or lasting symptoms for more than 2-3 days.  You have a fever and your symptoms suddenly get worse.  You have signs or symptoms of infection. These include: ? Chills. ? Being more tired than normal (lethargy). ? Irritability. ? Poor eating. ? Throwing up (vomiting).  You have pain that is not helped with medicine.  You have shortness of breath.  You have pain in your chest.  You are coughing up pus-like or bloody mucus.  You have a stiff neck.  Your feet or hands swell or have pain.  Your belly looks bloated.  Your joints hurt. This information is not intended to replace advice given to you by your health care provider. Make sure you discuss any questions you have with your health care provider. Document Released: 09/26/2013 Document Revised: 05/13/2016 Document Reviewed: 07/18/2013 Elsevier Interactive Patient Education  2017 [employer].

## 2017-09-28 NOTE — Discharge Summary (Signed)
[facility] Discharge Summary   Patient ID: Hannah Hawkins MRN: [id number] DOB/AGE: 33-Mar-1987 33 y.o.  Admit date: 09/28/2017 Discharge date: 09/28/2017  Primary Care Physician:  Massie Maroon, FNP  Admission Diagnoses:  Active Problems:   Sickle cell disease, type [place], with unspecified crisis [facility])   Discharge Diagnoses:     Sickle cell disease, type [place], with unspecified crisis [facility])  Discharge Medications: Current Meds  Medication Sig  . ergocalciferol (VITAMIN D2) 50000 units capsule Take 1 capsule (50,000 Units total) by mouth once a week.  . meclizine (ANTIVERT) 25 MG tablet Take 1 tablet (25 mg total) by mouth 3 (three) times daily as needed for dizziness.  . ondansetron (ZOFRAN) 4 MG tablet Take 1 tablet (4 mg total) by mouth every 8 (eight) hours as needed for nausea or vomiting.  . Oxycodone HCl 10 MG TABS Take 1 tablet (10 mg total) by mouth every 6 (six) hours as needed.    Consults:  N/A  Significant Diagnostic Studies:  Results for orders placed or performed during the hospital encounter of 09/28/17  CBC with Differential/Platelet  Result Value Ref Range   WBC 5.4 4.0 - 10.5 K/uL   RBC 4.34 3.87 - 5.11 MIL/uL   Hemoglobin 11.7 (L) 12.0 - 15.0 g/dL   HCT 40.9 (L) 81.1 - 91.4 %   MCV 73.5 (L) 78.0 - 100.0 fL   MCH 27.0 26.0 - 34.0 pg   MCHC 36.7 (H) 30.0 - 36.0 g/dL   RDW 78.2 95.6 - 21.3 %   Platelets 334 150 - 400 K/uL   Neutrophils Relative % 49 %   Lymphocytes Relative 34 %   Monocytes Relative 10 %   Eosinophils Relative 6 %   Basophils Relative 1 %   nRBC 5 (H) 0 /100 WBC   Neutro Abs 2.7 1.7 - 7.7 K/uL   Lymphs Abs 1.8 0.7 - 4.0 K/uL   Monocytes Absolute 0.5 0.1 - 1.0 K/uL   Eosinophils Absolute 0.3 0.0 - 0.7 K/uL   Basophils Absolute 0.1 0.0 - 0.1 K/uL   RBC Morphology TARGET CELLS   Comprehensive metabolic panel  Result Value Ref Range   Sodium 141 135 - 145 mmol/L   Potassium 4.1 3.5 - 5.1 mmol/L   Chloride 108 101 -  111 mmol/L   CO2 27 22 - 32 mmol/L   Glucose, Bld 86 65 - 99 mg/dL   BUN 7 6 - 20 mg/dL   Creatinine, Ser 0.86 0.44 - 1.00 mg/dL   Calcium 9.0 8.9 - 57.8 mg/dL   Total Protein 7.4 6.5 - 8.1 g/dL   Albumin 4.1 3.5 - 5.0 g/dL   AST 36 15 - 41 U/L   ALT 20 14 - 54 U/L   Alkaline Phosphatase 49 38 - 126 U/L   Total Bilirubin 0.6 0.3 - 1.2 mg/dL   GFR calc non Af Amer >60 >60 mL/min   GFR calc Af Amer >60 >60 mL/min   Anion gap 6 5 - 15  Reticulocytes  Result Value Ref Range   Retic Ct Pct 4.7 (H) 0.4 - 3.1 %   RBC. 4.34 3.87 - 5.11 MIL/uL   Retic Count, Absolute 204.0 (H) 19.0 - 186.0 K/uL   [facility] Course: Hannah Hawkins is a 31 y.o. female with a diagnosis of Sickle Cell Anemia, presented today with a complaint of generalized pain with increased pain to right lower leg. Hannah Hawkins reports initial onset of pain occurred over last two  days. Reports current pain intensity is 10/10 and she characterizes pain as aching. She reports that she is out of pain medication. Hannah Hawkins at present, denies headache, fever, shortness of breath, chest pain, dysuria, nausea, vomiting, or diarrhea. She was admitted to the day infusion for extended observation and pain management and the following was her course of treatment: Intravenous D5.45 @ 125 cc/hr administer for cellular rehydration. Toradol 30 mg intravenously for inflammation reduction.Placed on a High Concentration PCA per weight based protocol for pain control. Patient goal for self management achieved per patient. Current pain intensity 3/10 improved from admission pain intensity of 10/10.Reviewed laboratory values, consistent with baseline. Patient is alert, oriented and ambulatory.    Physical Exam at Discharge: BP 108/76 (BP Location: Left Arm)   Pulse 78   Temp 97.6 F (36.4 C) (Oral)   Resp 14   Ht  (1.6 m)   Wt 140 lb (63.5 kg)   SpO2 100%   BMI 24.80 kg/m  General Appearance:    Alert, cooperative, no distress,  appears stated age  Head:    Normocephalic, without obvious abnormality, atraumatic  Eyes:    PERRL, conjunctiva/corneas clear, EOM's intact  Back:     Symmetric, no curvature, ROM normal, no CVA tenderness  Lungs:     Clear to auscultation bilaterally, respirations unlabored  Chest Wall:    No tenderness or deformity   Heart:    Regular rate and rhythm, S1 and S2 normal, no murmur, rub   or gallop  Abdomen:     Soft, non-tender, bowel sounds active all four quadrants,    no masses, no organomegaly  Extremities:   Extremities normal, atraumatic, no cyanosis or edema  Neurologic:   Normal strength, sensation    Disposition at Discharge: 01-Home or Self Care  Discharge Orders:  -Continue to hydrate and take prescribed home medications as ordered. -Resume all home medications. -Keep upcoming appointment  -The patient was given clear instructions to go to ER or return to medical center if symptoms do not improve, worsen or new problems develop. The patient verbalized understanding.  Condition at Discharge:   Stable  Time spent on Discharge:  Greater than 25 minutes.  Signed: Joaquin Courts 09/28/2017, 3:20 PM

## 2017-09-29 ENCOUNTER — Telehealth: Payer: Self-pay

## 2017-09-30 ENCOUNTER — Ambulatory Visit (INDEPENDENT_AMBULATORY_CARE_PROVIDER_SITE_OTHER): Payer: Medicaid Other | Admitting: Family Medicine

## 2017-09-30 ENCOUNTER — Ambulatory Visit: Payer: Medicaid Other | Admitting: Family Medicine

## 2017-09-30 ENCOUNTER — Encounter: Payer: Self-pay | Admitting: Family Medicine

## 2017-09-30 ENCOUNTER — Telehealth (HOSPITAL_COMMUNITY): Payer: Self-pay | Admitting: Family Medicine

## 2017-09-30 VITALS — BP 109/76 | HR 64 | Temp 98.5°F | Resp 14 | Ht 62.0 in | Wt 141.0 lb

## 2017-09-30 DIAGNOSIS — M79604 Pain in right leg: Secondary | ICD-10-CM | POA: Diagnosis not present

## 2017-09-30 DIAGNOSIS — R42 Dizziness and giddiness: Secondary | ICD-10-CM | POA: Diagnosis not present

## 2017-09-30 DIAGNOSIS — Z79891 Long term (current) use of opiate analgesic: Secondary | ICD-10-CM | POA: Diagnosis not present

## 2017-09-30 DIAGNOSIS — D57219 Sickle-cell/Hb-C disease with crisis, unspecified: Secondary | ICD-10-CM

## 2017-09-30 DIAGNOSIS — R5383 Other fatigue: Secondary | ICD-10-CM

## 2017-09-30 DIAGNOSIS — R61 Generalized hyperhidrosis: Secondary | ICD-10-CM

## 2017-09-30 DIAGNOSIS — R110 Nausea: Secondary | ICD-10-CM

## 2017-09-30 LAB — TSH: TSH: 0.56 m[IU]/L

## 2017-09-30 LAB — POCT URINE PREGNANCY: PREG TEST UR: NEGATIVE

## 2017-09-30 MED ORDER — OXYCODONE HCL 10 MG PO TABS: 10.0000 mg | ORAL_TABLET | Freq: Four times a day (QID) | ORAL | 0 refills | Status: DC | PRN

## 2017-09-30 MED ORDER — ONDANSETRON HCL 4 MG PO TABS: 4.0000 mg | ORAL_TABLET | Freq: Three times a day (TID) | ORAL | 2 refills | Status: DC | PRN

## 2017-09-30 NOTE — Telephone Encounter (Signed)
Pt called and states experiencing symptoms of dizziness since this AM and right leg pain; pt advised, per NP, that she should come to her scheduled office visit appointment today for evaluation; pt verbalizes understanding

## 2017-09-30 NOTE — Progress Notes (Signed)
Subjective:    Patient ID: Hannah Hawkins, female    DOB: 13-Aug-1986, 31 y.o.   MRN: [id number]  HPI Hannah Hawkins, a 31 year old female with a history of sickle cell anemia, presents for a follow up. She is complaining of increased fatigue and pain over the past several weeks. Pain is primarily to left lower leg. Patient had an xray of left leg on 09/28/2017, which was unremarkable. She was treated in the day infusion center for a sickle cell crisis.   Hannah Hawkins has been out of pain medications for 1 week.Current pain intensity is 6/10. She says that she has not attempted any OTC analgesics to alleviate current symptoms. Patient receives all prescriptions for opiate medications from this clinic. Periodic drug screens are obtained with chronic prescription drug use. She denies headache, chest pains, shortness of breath, dysuria, nausea, vomiting, or diarrhea.  Past Medical History:  Diagnosis Date  . Sickle cell anemia (HCC)    Immunization History  Administered Date(s) Administered  . Pneumococcal Polysaccharide-23 02/18/2017  . Tdap 02/18/2017   Social History   Social History  . Marital status: Single    Spouse name: N/A  . Number of children: N/A  . Years of education: N/A   Occupational History  . Not on file.   Social History Main Topics  . Smoking status: Former Games developer  . Smokeless tobacco: Never Used  . Alcohol use Yes     Comment: Twice a month  . Drug use: No  . Sexual activity: Yes   Other Topics Concern  . Not on file   Social History Narrative  . No narrative on file       Opioid Risk Tool - 08/04/17 1100      Family History of Substance Abuse   Alcohol Negative   Illegal Drugs Negative   Rx Drugs Negative     Personal History of Substance Abuse   Alcohol Positive Female or Female   Illegal Drugs Positive Female or Female   Rx Drugs Positive Female or Female     Age   Age between 28-45 years  Yes     History of Preadolescent Sexual Abuse   History of Preadolescent Sexual Abuse Negative or Female     Psychological Disease   Psychological Disease Negative   Depression Negative     Total Score   Opioid Risk Tool Scoring 13   Opioid Risk Interpretation High Risk     Review of Systems  Constitutional: Negative for unexpected weight change.  HENT: Negative.   Eyes: Negative for visual disturbance.  Respiratory: Negative.   Cardiovascular: Negative for chest pain, palpitations and leg swelling.  Gastrointestinal: Negative.   Endocrine: Negative.  Negative for polydipsia, polyphagia and polyuria.  Genitourinary: Negative.   Musculoskeletal: Positive for arthralgias, back pain and myalgias.  Skin: Negative.   Allergic/Immunologic: Negative.  Negative for immunocompromised state.  Neurological: Positive for dizziness.  Hematological: Negative.   Psychiatric/Behavioral: Negative.        Objective:   Physical Exam  Constitutional: She is oriented to person, place, and time. She appears well-developed and well-nourished.  HENT:  Head: Normocephalic and atraumatic.  Right Ear: External ear normal.  Left Ear: External ear normal.  Mouth/Throat: Oropharynx is clear and moist.  Eyes: Pupils are equal, round, and reactive to light. Conjunctivae and EOM are normal.  Neck: Normal range of motion. Neck supple.  Cardiovascular: Normal rate, regular rhythm, normal heart sounds and intact distal pulses.   Pulmonary/Chest:  Effort normal and breath sounds normal.  Abdominal: Soft. Bowel sounds are normal.  Musculoskeletal:       Right knee: She exhibits decreased range of motion. She exhibits no swelling.       Right ankle: She exhibits decreased range of motion and swelling. She exhibits no ecchymosis, no deformity and normal pulse.       Lumbar back: She exhibits pain and spasm.  Neurological: She is alert and oriented to person, place, and time. She has normal reflexes.  Skin: Skin is warm and dry.  Psychiatric: She has a normal  mood and affect. Her behavior is normal. Judgment and thought content normal.      BP 109/76 (BP Location: Left Arm, Patient Position: Sitting, Cuff Size: Normal)   Pulse 64   Temp 98.5 F (36.9 C) (Oral)   Resp 14   Ht  (1.575 m)   Wt 141 lb (64 kg)   LMP 09/19/2017   SpO2 100%   BMI 25.79 kg/m  Assessment & Plan:   1. Sickle cell disease, type [place], with unspecified crisis (HCC) Sickle cell disease - Will transition to the day infusion center for fluids.  Will start folic acid 1 mg daily to prevent aplastic bone marrow crises.  Patient will transition to the day infusion center for extended observation and pain management for 6-7/10 pain related to sickle cell anemia. Patient is not interested in Endari to reduce sickle cell crisis,  discussed at length.   Pulmonary evaluation - Patient denies severe recurrent wheezes, shortness of breath with exercise, or persistent cough. If these symptoms develop, pulmonary function tests with spirometry will be ordered, and if abnormal, plan on referral to Pulmonology for further evaluation.  Cardiac - Baseline EKG Eye -Eye exam 1 month ago at [facility], with Dr. Carmela Rima  Immunization status - Vaccination up to date  Acute and chronic painful episodes - We agreed on Oxycodone 10 mg today.   We discussed that pt is to receive her Schedule II prescriptions only from [place]. Pt is also aware that the prescription history is available to [place] online through the [facility] CSRS. Patient will return for biweekly pill counts.  Controlled substance agreement signed 03/23/2017.  We reminded Ms. Holtsclaw that all patients receiving Schedule II narcotics must be seen for follow within one month of prescription being requested. We reviewed the terms of our pain agreement, including the need to keep medicines in a safe locked location away from children or pets, and the need to report excess sedation or constipation, measures to avoid constipation, and  policies related to early refills and stolen prescriptions. According to the [place] [facility] program, we have reviewed details related to analgesia, adverse effects, aberrant behaviors. - Pain Mgmt, Profile 8 w/Conf, U - Oxycodone HCl 10 MG TABS; Take 1 tablet (10 mg total) by mouth every 6 (six) hours as needed.  Dispense: 60 tablet; Refill: 0     Opioid Risk Tool - 09/30/17 1400      Family History of Substance Abuse   Alcohol Negative   Illegal Drugs Negative   Rx Drugs Negative     Personal History of Substance Abuse   Alcohol Negative   Illegal Drugs Negative   Rx Drugs Negative     Age   Age between 98-45 years  Yes     History of Preadolescent Sexual Abuse   History of Preadolescent Sexual Abuse Negative or Female     Psychological Disease  Psychological Disease Positive   ADD Negative   OCD Negative   Bipolar Negative   Schizophrenia Negative   Depression Positive     Total Score   Opioid Risk Tool Scoring 4   Opioid Risk Interpretation Moderate Risk     2. Right leg pain Reviewed previous xray, no avascular necrosis noted.  I sent a referral to orthopedics for evaluation.  Right leg pain is not associated with sickle cell - Ambulatory referral to Orthopedics  3. Vertigo Orthostatic vital signs were normal. She says that dizziness improved after sitting.  - Orthostatic vital signs  4. Night sweats Reviewed previous labs HIV non reactive.  Negative Hepatitis screening - Quantiferon tb gold assay  5. Other fatigue - POCT urine pregnancy - TSH  6. Chronic prescription opiate use - Oxycodone HCl 10 MG TABS; Take 1 tablet (10 mg total) by mouth every 6 (six) hours as needed.  Dispense: 60 tablet; Refill: 0  7. Nausea  - ondansetron (ZOFRAN) 4 MG tablet; Take 1 tablet (4 mg total) by mouth every 8 (eight) hours as needed for nausea or vomiting.  Dispense: 30 tablet; Refill: 2    RTC; 1 months for medication management  Nolon Nations   MSN, FNP-C Patient Care [facility] [address] [place], [place] [address] [phone]

## 2017-09-30 NOTE — Patient Instructions (Signed)
Will follow up by phone with any abnormal laboratory results  Discussed the importance of drinking 64 ounces of water daily. The Importance of Water. To help prevent pain crises, it is important to drink plenty of water throughout the day. This is because dehydration of red blood cells may lead to the sickling process.

## 2017-10-01 LAB — QUANTIFERON TB GOLD ASSAY (BLOOD)
QUANTIFERON TB AG MINUS NIL: 0.05 [IU]/mL
QUANTIFERON(R)-TB GOLD: NEGATIVE
Quantiferon Nil Value: 0.07 IU/mL

## 2017-10-02 LAB — POCT URINALYSIS DIP (DEVICE)
BILIRUBIN URINE: NEGATIVE
GLUCOSE, UA: NEGATIVE mg/dL
Hgb urine dipstick: NEGATIVE
KETONES UR: NEGATIVE mg/dL
LEUKOCYTES UA: NEGATIVE
NITRITE: NEGATIVE
PH: 7.5 (ref 5.0–8.0)
Protein, ur: NEGATIVE mg/dL
Specific Gravity, Urine: 1.02 (ref 1.005–1.030)
Urobilinogen, UA: 1 mg/dL (ref 0.0–1.0)

## 2017-10-04 LAB — PAIN MGMT, PROFILE 8 W/CONF, U
6 ACETYLMORPHINE: NEGATIVE ng/mL (ref <10)
ALCOHOL METABOLITES: NEGATIVE ng/mL (ref <500)
Amphetamines: NEGATIVE ng/mL (ref <500)
BENZODIAZEPINES: NEGATIVE ng/mL (ref <100)
BUPRENORPHINE, URINE: NEGATIVE ng/mL (ref <5)
COCAINE METABOLITE: NEGATIVE ng/mL (ref <150)
Codeine: NEGATIVE ng/mL (ref <50)
Creatinine: 83.1 mg/dL
HYDROCODONE: NEGATIVE ng/mL (ref <50)
HYDROMORPHONE: 239 ng/mL — AB (ref <50)
MDMA: NEGATIVE ng/mL (ref <500)
MORPHINE: NEGATIVE ng/mL (ref <50)
Marijuana Metabolite: NEGATIVE ng/mL (ref <20)
NORHYDROCODONE: NEGATIVE ng/mL (ref <50)
Opiates: POSITIVE ng/mL — AB (ref <100)
Oxidant: NEGATIVE ug/mL (ref <200)
Oxycodone: NEGATIVE ng/mL (ref <100)
pH: 6.1 (ref 4.5–9.0)

## 2017-10-06 ENCOUNTER — Telehealth: Payer: Self-pay

## 2017-10-06 NOTE — Telephone Encounter (Signed)
Called and spoke to patient. A prior auth was completed on 10/03/2017 with the status approved. Asked that patient check with the pharmacy since it is approved on our end. Thanks!

## 2017-10-07 ENCOUNTER — Other Ambulatory Visit: Payer: Self-pay | Admitting: Family Medicine

## 2017-10-07 DIAGNOSIS — D57219 Sickle-cell/Hb-C disease with crisis, unspecified: Secondary | ICD-10-CM

## 2017-10-07 DIAGNOSIS — Z79891 Long term (current) use of opiate analgesic: Secondary | ICD-10-CM

## 2017-10-07 MED ORDER — OXYCODONE HCL 10 MG PO TABS: 10.0000 mg | ORAL_TABLET | Freq: Four times a day (QID) | ORAL | 0 refills | Status: DC | PRN

## 2017-10-07 NOTE — Progress Notes (Signed)
Reviewed [place] Substance Reporting system prior to prescribing opiate medications. No inconsistencies noted.  Patient was only able to receive 28 tablets on 09/30/2017. She is needing a new prescription for the remaining 32 tablets.   Meds ordered this encounter  Medications  . Oxycodone HCl 10 MG TABS    Sig: Take 1 tablet (10 mg total) by mouth every 6 (six) hours as needed.    Dispense:  32 tablet    Refill:  0    Order Specific Question:   Supervising Provider    Answer:   [name], [name] [[id number]]    Nolon NationsLaChina Moore Hollis  MSN, FNP-C Patient Care [facility] [address]  [place], [place] [phone]

## 2017-10-13 ENCOUNTER — Encounter (HOSPITAL_COMMUNITY): Payer: Self-pay

## 2017-10-13 ENCOUNTER — Non-Acute Institutional Stay (HOSPITAL_COMMUNITY)
Admission: AD | Admit: 2017-10-13 | Discharge: 2017-10-13 | Disposition: A | Discharge status: Home / Self Care | Payer: Medicaid Other | Source: Ambulatory Visit | Attending: Internal Medicine | Admitting: Internal Medicine

## 2017-10-13 ENCOUNTER — Telehealth: Payer: Self-pay

## 2017-10-13 DIAGNOSIS — D57219 Sickle-cell/Hb-C disease with crisis, unspecified: Secondary | ICD-10-CM | POA: Diagnosis not present

## 2017-10-13 DIAGNOSIS — Z79899 Other long term (current) drug therapy: Secondary | ICD-10-CM | POA: Insufficient documentation

## 2017-10-13 DIAGNOSIS — D5720 Sickle-cell/Hb-C disease without crisis: Secondary | ICD-10-CM | POA: Diagnosis present

## 2017-10-13 DIAGNOSIS — Z87891 Personal history of nicotine dependence: Secondary | ICD-10-CM | POA: Diagnosis not present

## 2017-10-13 DIAGNOSIS — Z79891 Long term (current) use of opiate analgesic: Secondary | ICD-10-CM | POA: Diagnosis not present

## 2017-10-13 LAB — COMPREHENSIVE METABOLIC PANEL
ALBUMIN: 3.9 g/dL (ref 3.5–5.0)
ALT: 17 U/L (ref 14–54)
ANION GAP: 8 (ref 5–15)
AST: 22 U/L (ref 15–41)
Alkaline Phosphatase: 45 U/L (ref 38–126)
BILIRUBIN TOTAL: 0.9 mg/dL (ref 0.3–1.2)
BUN: 7 mg/dL (ref 6–20)
CO2: 24 mmol/L (ref 22–32)
Calcium: 8.9 mg/dL (ref 8.9–10.3)
Chloride: 106 mmol/L (ref 101–111)
Creatinine, Ser: 0.87 mg/dL (ref 0.44–1.00)
GFR calc Af Amer: >60 mL/min (ref >60)
GFR calc non Af Amer: >60 mL/min (ref >60)
Glucose, Bld: 87 mg/dL (ref 65–99)
POTASSIUM: 3.7 mmol/L (ref 3.5–5.1)
SODIUM: 138 mmol/L (ref 135–145)
Total Protein: 6.6 g/dL (ref 6.5–8.1)

## 2017-10-13 LAB — CBC WITH DIFFERENTIAL/PLATELET
BASOS ABS: 0.1 10*3/uL (ref 0.0–0.1)
Basophils Relative: 1 %
Eosinophils Absolute: 0.8 10*3/uL — ABNORMAL HIGH (ref 0.0–0.7)
Eosinophils Relative: 10 %
HEMATOCRIT: 30.8 % — AB (ref 36.0–46.0)
HEMOGLOBIN: 11.4 g/dL — AB (ref 12.0–15.0)
LYMPHS PCT: 43 %
Lymphs Abs: 3.4 10*3/uL (ref 0.7–4.0)
MCH: 27 pg (ref 26.0–34.0)
MCHC: 37 g/dL — ABNORMAL HIGH (ref 30.0–36.0)
MCV: 72.8 fL — ABNORMAL LOW (ref 78.0–100.0)
MONO ABS: 0.8 10*3/uL (ref 0.1–1.0)
Monocytes Relative: 10 %
NEUTROS ABS: 2.9 10*3/uL (ref 1.7–7.7)
Neutrophils Relative %: 37 %
Platelets: 292 10*3/uL (ref 150–400)
RBC: 4.23 MIL/uL (ref 3.87–5.11)
RDW: 14.6 % (ref 11.5–15.5)
WBC: 7.8 10*3/uL (ref 4.0–10.5)

## 2017-10-13 LAB — RETICULOCYTES
RBC.: 4.23 MIL/uL (ref 3.87–5.11)
RETIC COUNT ABSOLUTE: 194.6 10*3/uL — AB (ref 19.0–186.0)
Retic Ct Pct: 4.6 % — ABNORMAL HIGH (ref 0.4–3.1)

## 2017-10-13 MED ORDER — SODIUM CHLORIDE 0.9% FLUSH: 9.0000 mL | INTRAVENOUS | Status: DC | PRN

## 2017-10-13 MED ORDER — SODIUM CHLORIDE 0.9 % IV SOLN
25.0000 mg | INTRAVENOUS | Status: DC | PRN
  Filled 2017-10-13: qty 0.5

## 2017-10-13 MED ORDER — HYDROMORPHONE HCL 1 MG/ML IJ SOLN
1.0000 mg | Freq: Once | INTRAMUSCULAR | Status: AC
  Administered 2017-10-13: 1 mg via SUBCUTANEOUS
  Filled 2017-10-13: qty 1

## 2017-10-13 MED ORDER — ONDANSETRON HCL 4 MG/2ML IJ SOLN
4.0000 mg | Freq: Four times a day (QID) | INTRAMUSCULAR | Status: DC | PRN
  Administered 2017-10-13: 4 mg via INTRAVENOUS
  Filled 2017-10-13 (×2): qty 2

## 2017-10-13 MED ORDER — DIPHENHYDRAMINE HCL 25 MG PO CAPS
25.0000 mg | ORAL_CAPSULE | ORAL | Status: DC | PRN
  Administered 2017-10-13 (×2): 25 mg via ORAL
  Filled 2017-10-13 (×2): qty 1

## 2017-10-13 MED ORDER — HYDROMORPHONE 1 MG/ML IV SOLN
INTRAVENOUS | Status: DC
  Administered 2017-10-13: 12:00:00 via INTRAVENOUS
  Administered 2017-10-13: 7 mg via INTRAVENOUS
  Filled 2017-10-13: qty 25

## 2017-10-13 MED ORDER — NALOXONE HCL 0.4 MG/ML IJ SOLN: 0.4000 mg | INTRAMUSCULAR | Status: DC | PRN

## 2017-10-13 MED ORDER — DEXTROSE-NACL 5-0.45 % IV SOLN
INTRAVENOUS | Status: DC
  Administered 2017-10-13: 12:00:00 via INTRAVENOUS

## 2017-10-13 MED ORDER — KETOROLAC TROMETHAMINE 30 MG/ML IJ SOLN
15.0000 mg | Freq: Once | INTRAMUSCULAR | Status: AC
  Administered 2017-10-13: 15 mg via INTRAVENOUS
  Filled 2017-10-13: qty 1

## 2017-10-13 NOTE — Discharge Instructions (Signed)
Sickle Cell Anemia, Adult °Sickle cell anemia is a condition in which red blood cells have an abnormal “sickle” shape. This abnormal shape shortens the cells’ life span, which results in a lower than normal concentration of red blood cells in the blood. The sickle shape also causes the cells to clump together and block free blood flow through the blood vessels. As a result, the tissues and organs of the body do not receive enough oxygen. Sickle cell anemia causes organ damage and pain and increases the risk of infection. °What are the causes? °Sickle cell anemia is a genetic disorder. Those who receive two copies of the gene have the condition, and those who receive one copy have the trait. °What increases the risk? °The sickle cell gene is most common in people whose families originated in [place]. Other areas of the globe where sickle cell trait occurs include the Mediterranean, [place] and [place], the [place], and the [place]. °What are the signs or symptoms? °· Pain, especially in the extremities, back, chest, or abdomen (common). The pain may start suddenly or may develop following an illness, especially if there is dehydration. Pain can also occur due to overexertion or exposure to extreme temperature changes. °· Frequent severe bacterial infections, especially certain types of pneumonia and meningitis. °· Pain and swelling in the hands and feet. °· Decreased activity. °· Loss of appetite. °· Change in behavior. °· Headaches. °· Seizures. °· Shortness of breath or difficulty breathing. °· Vision changes. °· Skin ulcers. °Those with the trait may not have symptoms or they may have mild symptoms. °How is this diagnosed? °Sickle cell anemia is diagnosed with blood tests that demonstrate the genetic trait. It is often diagnosed during the newborn period, due to mandatory testing nationwide. A variety of blood tests, X-rays, CT scans, MRI scans, ultrasounds, and lung function tests may also be done to  monitor the condition. °How is this treated? °Sickle cell anemia may be treated with: °· Medicines. You may be given pain medicines, antibiotic medicines (to treat and prevent infections) or medicines to increase the production of certain types of hemoglobin. °· Fluids. °· Oxygen. °· Blood transfusions. ° °Follow these instructions at home: °· Drink enough fluid to keep your urine clear or pale yellow. Increase your fluid intake in hot weather and during exercise. °· Do not smoke. Smoking lowers oxygen levels in the blood. °· Only take over-the-counter or prescription medicines for pain, fever, or discomfort as directed by your health care provider. °· Take antibiotics as directed by your health care provider. Make sure you finish them it even if you start to feel better. °· Take supplements as directed by your health care provider. °· Consider wearing a medical alert bracelet. This tells anyone caring for you in an emergency of your condition. °· When traveling, keep your medical information, health care provider's names, and the medicines you take with you at all times. °· If you develop a fever, do not take medicines to reduce the fever right away. This could cover up a problem that is developing. Notify your health care provider. °· Keep all follow-up appointments with your health care provider. Sickle cell anemia requires regular medical care. °Contact a health care provider if: °You have a fever. °Get help right away if: °· You feel dizzy or faint. °· You have new abdominal pain, especially on the left side near the stomach area. °· You develop a persistent, often uncomfortable and painful penile erection (priapism). If this is not   treated immediately it will lead to impotence. °· You have numbness your arms or legs or you have a hard time moving them. °· You have a hard time with speech. °· You have a fever or persistent symptoms for more than 2-3 days. °· You have a fever and your symptoms suddenly get  worse. °· You have signs or symptoms of infection. These include: °? Chills. °? Abnormal tiredness (lethargy). °? Irritability. °? Poor eating. °? Vomiting. °· You develop pain that is not helped with medicine. °· You develop shortness of breath. °· You have pain in your chest. °· You are coughing up pus-like or bloody sputum. °· You develop a stiff neck. °· Your feet or hands swell or have pain. °· Your abdomen appears bloated. °· You develop joint pain. °This information is not intended to replace advice given to you by your health care provider. Make sure you discuss any questions you have with your health care provider. °Document Released: 03/16/2006 Document Revised: 06/25/2016 Document Reviewed: 07/18/2013 °Elsevier Interactive Patient Education © 2017 Elsevier Inc. ° °

## 2017-10-13 NOTE — Progress Notes (Signed)
Pt discharged to home; discharge instructions explained, given, and signed; all questions answered; pt alert, oriented, ambulatory; no complications noted. 

## 2017-10-13 NOTE — H&P (Signed)
[facility] History and Physical   Date: 10/13/2017  Patient name: Hannah Hawkins Medical record number: [id number] Date of birth: 1986/07/09 Age: 31 y.o. Gender: female PCP: Massie MaroonHollis, Derrick Tiegs M, FNP  Attending physician: [name], [name], MD  Chief Complaint: Generalized pain  History of Present Illness: Hannah Hawkins, a 31 year old female with a history of sickle cell anemia, HbSC presents complaining of pain to right leg that is consistent with typical sickle cell pain. Pain started 1 day ago and she attributes to changes in weather. Also, she is out of opiate pain medications. Current pain intensity is 10/10 described as constant and throbbing. She denies shortness of breath, headache, chest pain, paresthesias, nausea, vomiting, or diarrhea. Patient will be admitted to the day infusion center for pain management and extended observation.   Meds: Prescriptions Prior to Admission  Medication Sig Dispense Refill Last Dose  . ergocalciferol (VITAMIN D2) 50000 units capsule Take 1 capsule (50,000 Units total) by mouth once a week. 12 capsule 3 Past Month at Unknown time  . folic acid (FOLVITE) 1 MG tablet Take 1 tablet (1 mg total) by mouth daily. 30 tablet 11 Past Month at Unknown time  . ibuprofen (ADVIL,MOTRIN) 800 MG tablet Take 1 tablet (800 mg total) by mouth every 8 (eight) hours as needed for moderate pain. 30 tablet 5 10/12/2017 at Unknown time  . ondansetron (ZOFRAN) 4 MG tablet Take 1 tablet (4 mg total) by mouth every 8 (eight) hours as needed for nausea or vomiting. 30 tablet 2 10/12/2017 at Unknown time  . Oxycodone HCl 10 MG TABS Take 1 tablet (10 mg total) by mouth every 6 (six) hours as needed. 32 tablet 0 10/12/2017 at Unknown time  . meclizine (ANTIVERT) 25 MG tablet Take 1 tablet (25 mg total) by mouth 3 (three) times daily as needed for dizziness. 30 tablet 0 Past Week at Unknown time  . naloxegol oxalate (MOVANTIK) 12.5 MG TABS tablet Take 1 tablet  (12.5 mg total) by mouth daily. (Patient taking differently: Take 12.5 mg by mouth daily as needed (pain). ) 30 tablet 2 Unknown at Unknown time  . ondansetron (ZOFRAN) 4 MG tablet Take 1 tablet (4 mg total) by mouth every 8 (eight) hours as needed for nausea or vomiting. 30 tablet 2     Allergies: Penicillins Past Medical History:  Diagnosis Date  . Sickle cell anemia (HCC)    Past Surgical History:  Procedure Laterality Date  . EYE SURGERY  03/09/2017   left eye   History reviewed. No pertinent family history. Social History   Social History  . Marital status: Single    Spouse name: N/A  . Number of children: N/A  . Years of education: N/A   Occupational History  . Not on file.   Social History Main Topics  . Smoking status: Former Games developermoker  . Smokeless tobacco: Never Used  . Alcohol use Yes     Comment: Twice a month  . Drug use: No  . Sexual activity: Yes   Other Topics Concern  . Not on file   Social History Narrative  . No narrative on file   Review of Systems  Constitutional: Negative.   HENT: Negative.   Eyes: Negative for blurred vision.  Cardiovascular: Negative for chest pain, palpitations and leg swelling.  Gastrointestinal: Negative.  Negative for abdominal pain, diarrhea, heartburn and nausea.  Genitourinary: Negative.   Musculoskeletal: Positive for joint pain (Right leg pain) and myalgias.  Skin: Negative.  Neurological: Negative.   Endo/Heme/Allergies: Negative.   Psychiatric/Behavioral: Negative.     Physical Exam: Blood pressure 111/70, pulse 72, temperature (!) 97.3 F (36.3 C), temperature source Oral, height 5\' 3"  (1.6 m), weight 140 lb (63.5 kg), last menstrual period 09/22/2017, SpO2 99 %. BP 101/64 (BP Location: Left Arm)   Pulse 81   Temp 97.6 F (36.4 C) (Oral)   Resp 18   Ht 5\' 3"  (1.6 m)   Wt 140 lb (63.5 kg)   LMP 09/22/2017 (Exact Date)   SpO2 98%   BMI 24.80 kg/m   General Appearance:    Alert, cooperative, moderate  distress, appears stated age  Head:    Normocephalic, without obvious abnormality, atraumatic  Eyes:    PERRL, conjunctiva/corneas clear, EOM's intact, fundi    benign, both eyes  Ears:    Normal TM's and external ear canals, both ears  Nose:   Nares normal, septum midline, mucosa normal, no drainage    or sinus tenderness  Throat:   Lips, mucosa, and tongue normal; teeth and gums normal  Neck:   Supple, symmetrical, trachea midline, no adenopathy;    thyroid:  no enlargement/tenderness/nodules; no carotid   bruit or JVD  Back:     Symmetric, no curvature, ROM normal, no CVA tenderness  Lungs:     Clear to auscultation bilaterally, respirations unlabored  Chest Wall:    No tenderness or deformity   Heart:    Regular rate and rhythm, S1 and S2 normal, no murmur, rub   or gallop  Abdomen:     Soft, non-tender, bowel sounds active all four quadrants,    no masses, no organomegaly  Extremities:   Extremities normal, atraumatic, no cyanosis or edema  Pulses:   2+ and symmetric all extremities  Skin:   Skin color, texture, turgor normal, no rashes or lesions  Lymph nodes:   Cervical, supraclavicular, and axillary nodes normal  Neurologic:   CNII-XII intact, normal strength, sensation and reflexes    throughout    Lab results: No results found for this or any previous visit (from the past 24 hour(s)).  Imaging results:  No results found.   Assessment & Plan:   Patient will be admitted to the day infusion center for extended observation  Start IV D5.45 for cellular rehydration at 125/hr  Start Toradol 15 mg IV every 6 times one for inflammation.  Start Dilaudid PCA High Concentration per weight based protocol.   Patient will be re-evaluated for pain intensity in the context of function and relationship to baseline as care progresses.  If no significant pain relief, will transfer patient to inpatient services for a higher level of care.   Will check CMP, reticulocytes,  and CBC  w/differential  Creta Dorame M 10/13/2017, 10:18 AM

## 2017-10-13 NOTE — Discharge Summary (Signed)
[facility] Discharge Summary   Patient ID: Hannah Hawkins Bicknell MRN: [id number] DOB/AGE: 07/13/1986 31 y.o.  Admit date: 10/13/2017 Discharge date: 10/13/2017  Primary Care Physician:  Massie MaroonHollis, Callaghan Laverdure M, FNP  Admission Diagnoses:  Active Problems:   Sickle cell disease, type [place], with unspecified crisis [facility](HCC)  Discharge Medications:  Allergies as of 10/13/2017      Reactions   Penicillins Swelling, Rash   Has patient had a PCN reaction causing immediate rash, facial/tongue/throat swelling, SOB or lightheadedness with hypotension: Yes Has patient had a PCN reaction causing severe rash involving mucus membranes or skin necrosis: Yes Has patient had a PCN reaction that required hospitalization No Has patient had a PCN reaction occurring within the last 10 years: no If all of the above answers are "NO", then may proceed with Cephalosporin use.      Medication List    TAKE these medications   ergocalciferol 50000 units capsule Commonly known as:  VITAMIN D2 Take 1 capsule (50,000 Units total) by mouth once a week.   folic acid 1 MG tablet Commonly known as:  FOLVITE Take 1 tablet (1 mg total) by mouth daily.   ibuprofen 800 MG tablet Commonly known as:  ADVIL,MOTRIN Take 1 tablet (800 mg total) by mouth every 8 (eight) hours as needed for moderate pain.   meclizine 25 MG tablet Commonly known as:  ANTIVERT Take 1 tablet (25 mg total) by mouth 3 (three) times daily as needed for dizziness.   naloxegol oxalate 12.5 MG Tabs tablet Commonly known as:  MOVANTIK Take 1 tablet (12.5 mg total) by mouth daily. What changed:  when to take this  reasons to take this   ondansetron 4 MG tablet Commonly known as:  ZOFRAN Take 1 tablet (4 mg total) by mouth every 8 (eight) hours as needed for nausea or vomiting.   ondansetron 4 MG tablet Commonly known as:  ZOFRAN Take 1 tablet (4 mg total) by mouth every 8 (eight) hours as needed for nausea or vomiting.    Oxycodone HCl 10 MG Tabs Take 1 tablet (10 mg total) by mouth every 6 (six) hours as needed.        Consults:  None  Significant Diagnostic Studies:  Dg Tibia/fibula Right  Result Date: 09/28/2017 CLINICAL DATA:  31 year old female with 1 month of right lower leg pain. Sickle cell disease. EXAM: RIGHT TIBIA AND FIBULA - 2 VIEW COMPARISON:  Right hip series 2418. FINDINGS: There is no evidence of fracture or other focal bone lesions. Soft tissues are unremarkable. IMPRESSION: Negative. Electronically Signed   By: Odessa FlemingH  Hall M.D.   On: 09/28/2017 16:04     [facility] Course: Hannah Hawkins Neary, a 31 year old female with a history of sickle cell anemia, HbSC presented complaining of pain to right leg that is consistent with typical sickle cell pain. Pain started 1 day ago and she attributes to changes in weather. Also, she is out of opiate pain medications. Pain intensity on arrival 10/10 described as constant and throbbing. She denies shortness of breath, headache, chest pain, paresthesias, nausea, vomiting, or diarrhea. Patient will be admitted to the day infusion center for pain management and extended observation  Reviewed laboratory values, consistent with baseline Patient hemodynamically stable.  Pain management:  Treated with hypotonic IV fluids for cellular rehydration Toradol 15 mg IV for information  Poor venous access, notified IV team.  Received Dilaudid 1 mg SQ times 1.  High concentration PCA per weight based protocol.  She used a total of 7 mg with 12 demands and 12 deliveries.  Pain intensity decreased from 10/10 to 2/10. Patient in stable condition.  Alert, oriented, and ambulating   Discharge instructions:  Resume all home medications Follow up with primary provider in one week.   Discussed the importance of drinking 64 ounces of water daily. The Importance of Water. To help prevent pain crises, it is important to drink plenty of water throughout the  day. This is because dehydration of red blood cells may lead to the sickling process.     The patient was given clear instructions to go to ER or return to medical center if symptoms do not improve, worsen or new problems develop. The patient verbalized understanding.     Physical Exam at Discharge:  BP 101/64 (BP Location: Left Arm)   Pulse 81   Temp 97.6 F (36.4 C) (Oral)   Resp 18   Ht 5\' 3"  (1.6 m)   Wt 140 lb (63.5 kg)   LMP 09/22/2017 (Exact Date)   SpO2 98%   BMI 24.80 kg/m    General Appearance:    Alert, cooperative, no distress, appears stated age  Head:    Normocephalic, without obvious abnormality, atraumatic  Neck:   Supple, symmetrical, trachea midline, no adenopathy;    thyroid:  no enlargement/tenderness/nodules; no carotid   bruit or JVD  Back:     Symmetric, no curvature, ROM normal, no CVA tenderness  Lungs:     Clear to auscultation bilaterally, respirations unlabored  Chest Wall:    No tenderness or deformity   Heart:    Regular rate and rhythm, S1 and S2 normal, no murmur, rub   or gallop  Abdomen:     Soft, non-tender, bowel sounds active all four quadrants,    no masses, no organomegaly  Extremities:   Extremities normal, atraumatic, no cyanosis or edema  Pulses:   2+ and symmetric all extremities  Skin:   Skin color, texture, turgor normal, no rashes or lesions  Lymph nodes:   Cervical, supraclavicular, and axillary nodes normal  Neurologic:   CNII-XII intact, normal strength, sensation and reflexes    throughout    Disposition at Discharge: 01-Home or Self Care  Discharge Orders:   Condition at Discharge:   Stable  Time spent on Discharge:  Greater than 30 minutes.  Signed: Keeva Reisen M 10/13/2017, 3:51 PM

## 2017-10-13 NOTE — Progress Notes (Signed)
[facility] complaint of 10/10 right leg pain.  Stated she has run out of pain medication due to the fact they would only fill partial prescription last time.  She denies fever, nausea, vomiting, diarrhea, or chest pain.  Spoke with Julianne HandlerLaChina Hollis, FNP-C and she agreed [facility].  Patient taken to Acute room 3.

## 2017-10-14 ENCOUNTER — Other Ambulatory Visit: Payer: Self-pay | Admitting: Family Medicine

## 2017-10-14 DIAGNOSIS — Z79891 Long term (current) use of opiate analgesic: Secondary | ICD-10-CM

## 2017-10-14 DIAGNOSIS — D57219 Sickle-cell/Hb-C disease with crisis, unspecified: Secondary | ICD-10-CM

## 2017-10-14 MED ORDER — OXYCODONE HCL 10 MG PO TABS: 10.0000 mg | ORAL_TABLET | Freq: Four times a day (QID) | ORAL | 0 refills | Status: DC | PRN

## 2017-10-14 NOTE — Progress Notes (Signed)
Reviewed [place] Substance Reporting system prior to prescribing opiate medications. No inconsistencies noted.  Most recent pain management profile on 09/30/2017, opiate metabolites present.   Meds ordered this encounter  Medications  . Oxycodone HCl 10 MG TABS    Sig: Take 1 tablet (10 mg total) by mouth every 6 (six) hours as needed.    Dispense:  60 tablet    Refill:  0    Rx not to be filled prior to 10/15/2017    Order Specific Question:   Supervising Provider    Answer:   [name], [name] [[id number]]     Hannah NationsLaChina Hawkins Hannah Vavra  MSN, FNP-C [name], [name]

## 2017-10-28 ENCOUNTER — Other Ambulatory Visit: Payer: Self-pay | Admitting: Family Medicine

## 2017-10-28 ENCOUNTER — Telehealth: Payer: Self-pay

## 2017-10-28 DIAGNOSIS — D571 Sickle-cell disease without crisis: Secondary | ICD-10-CM

## 2017-10-28 DIAGNOSIS — Z79891 Long term (current) use of opiate analgesic: Secondary | ICD-10-CM

## 2017-10-28 MED ORDER — OXYCODONE HCL 10 MG PO TABS: 10.0000 mg | ORAL_TABLET | Freq: Four times a day (QID) | ORAL | 0 refills | Status: DC | PRN

## 2017-10-28 NOTE — Progress Notes (Signed)
Reviewed [place] Substance Reporting system prior to prescribing opiate medications. No inconsistencies noted.   Meds ordered this encounter  Medications  . Oxycodone HCl 10 MG TABS    Sig: Take 1 tablet (10 mg total) every 6 (six) hours as needed by mouth.    Dispense:  60 tablet    Refill:  0    Rx not to be filled prior to 11/03/2017    Order Specific Question:   Supervising Provider    Answer:   [name], [name] [[id number]]    Hannah NationsLaChina Moore Jodel Mayhall  MSN, FNP-C [facility] Care [facility] [address]  [place], [place] [address] [phone]

## 2017-10-31 ENCOUNTER — Other Ambulatory Visit: Payer: Self-pay

## 2017-10-31 ENCOUNTER — Encounter: Payer: Self-pay | Admitting: Family Medicine

## 2017-10-31 ENCOUNTER — Ambulatory Visit (INDEPENDENT_AMBULATORY_CARE_PROVIDER_SITE_OTHER): Payer: Medicaid Other | Admitting: Family Medicine

## 2017-10-31 VITALS — BP 134/92 | HR 83 | Temp 98.3°F | Resp 16 | Ht 62.0 in | Wt 141.0 lb

## 2017-10-31 DIAGNOSIS — D57219 Sickle-cell/Hb-C disease with crisis, unspecified: Secondary | ICD-10-CM | POA: Diagnosis not present

## 2017-10-31 DIAGNOSIS — Z79891 Long term (current) use of opiate analgesic: Secondary | ICD-10-CM

## 2017-10-31 MED ORDER — MECLIZINE HCL 25 MG PO TABS: 25.0000 mg | ORAL_TABLET | Freq: Three times a day (TID) | ORAL | 0 refills | Status: DC | PRN

## 2017-10-31 NOTE — Patient Instructions (Addendum)
Pain is controlled on current medication regimen.  Will continue oxycodone 10 mg every 6 hours as needed for severe pain.  Discussed the importance of drinking 64 ounces of water daily. The Importance of Water. To help prevent pain crises, it is important to drink plenty of water throughout the day. This is because dehydration of red blood cells may lead to the sickling process.     Sickle Cell Anemia, Adult Sickle cell anemia is a condition where your red blood cells are shaped like sickles. Red blood cells carry oxygen through the body. Sickle-shaped red blood cells do not live as long as normal red blood cells. They also clump together and block blood from flowing through the blood vessels. These things prevent the body from getting enough oxygen. Sickle cell anemia causes organ damage and pain. It also increases the risk of infection. Follow these instructions at home:  Drink enough fluid to keep your pee (urine) clear or pale yellow. Drink more in hot weather and during exercise.  Do not smoke. Smoking lowers oxygen levels in the blood.  Only take over-the-counter or prescription medicines as told by your doctor.  Take antibiotic medicines as told by your doctor. Make sure you finish them even if you start to feel better.  Take supplements as told by your doctor.  Consider wearing a medical alert bracelet. This tells anyone caring for you in an emergency of your condition.  When traveling, keep your medical information, doctors' names, and the medicines you take with you at all times.  If you have a fever, do not take fever medicines right away. This could cover up a problem. Tell your doctor.  Keep all follow-up visits with your doctor. Sickle cell anemia requires regular medical care. Contact a doctor if: You have a fever. Get help right away if:  You feel dizzy or faint.  You have new belly (abdominal) pain, especially on the left side near the stomach area.  You have a  lasting, often uncomfortable and painful erection of the penis (priapism). If it is not treated right away, you will become unable to have sex (impotence).  You have numbness in your arms or legs or you have a hard time moving them.  You have a hard time talking.  You have a fever or lasting symptoms for more than 2-3 days.  You have a fever and your symptoms suddenly get worse.  You have signs or symptoms of infection. These include: ? Chills. ? Being more tired than normal (lethargy). ? Irritability. ? Poor eating. ? Throwing up (vomiting).  You have pain that is not helped with medicine.  You have shortness of breath.  You have pain in your chest.  You are coughing up pus-like or bloody mucus.  You have a stiff neck.  Your feet or hands swell or have pain.  Your belly looks bloated.  Your joints hurt. This information is not intended to replace advice given to you by your health care provider. Make sure you discuss any questions you have with your health care provider. Document Released: 09/26/2013 Document Revised: 05/13/2016 Document Reviewed: 07/18/2013 Elsevier Interactive Patient Education  2017 [employer]

## 2017-10-31 NOTE — Progress Notes (Signed)
Subjective:    Patient ID: Hannah ConferBeatrice Friberg, female    DOB: September 24, 1986, 31 y.o.   MRN: [id number]  HPI Hannah Hawkins, a 31 year old female with a history of sickle cell anemia, HbSC, presents for a follow up.  Patient feels well and is without complaint.  She states the pain is well controlled on current medication regimen.  She last had oxycodone 10 mg this a.m. with maximal relief.  Patient currently denies headache, eye pain, fever, chest pains, nausea, vomiting, dysuria, or diarrhea. Past Medical History:  Diagnosis Date  . Sickle cell anemia (HCC)    Immunization History  Administered Date(s) Administered  . Pneumococcal Polysaccharide-23 02/18/2017  . Tdap 02/18/2017   Social History   Socioeconomic History  . Marital status: Single    Spouse name: Not on file  . Number of children: Not on file  . Years of education: Not on file  . Highest education level: Not on file  Social Needs  . Financial resource strain: Not on file  . Food insecurity - worry: Not on file  . Food insecurity - inability: Not on file  . Transportation needs - medical: Not on file  . Transportation needs - non-medical: Not on file  Occupational History  . Not on file  Tobacco Use  . Smoking status: Former Games developermoker  . Smokeless tobacco: Never Used  Substance and Sexual Activity  . Alcohol use: Yes    Comment: Twice a month  . Drug use: No  . Sexual activity: Yes  Other Topics Concern  . Not on file  Social History Narrative  . Not on file   Opioid Risk Tool - 09/30/17 1400      Family History of Substance Abuse   Alcohol  Negative    Illegal Drugs  Negative    Rx Drugs  Negative      Personal History of Substance Abuse   Alcohol  Negative    Illegal Drugs  Negative    Rx Drugs  Negative      Age   Age between 5316-45 years   Yes      History of Preadolescent Sexual Abuse   History of Preadolescent Sexual Abuse  Negative or Female      Psychological Disease   Psychological  Disease  Positive    ADD  Negative    OCD  Negative    Bipolar  Negative    Schizophrenia  Negative    Depression  Positive      Total Score   Opioid Risk Tool Scoring  4    Opioid Risk Interpretation  Moderate Risk      Review of Systems  Constitutional: Negative for unexpected weight change.  HENT: Negative.   Eyes: Negative for visual disturbance.  Respiratory: Negative.   Cardiovascular: Negative for chest pain, palpitations and leg swelling.  Gastrointestinal: Negative.   Endocrine: Negative.  Negative for polydipsia, polyphagia and polyuria.  Genitourinary: Negative.   Musculoskeletal: Positive for myalgias.  Skin: Negative.   Allergic/Immunologic: Negative.  Negative for immunocompromised state.  Hematological: Negative.   Psychiatric/Behavioral: Negative.        Objective:   Physical Exam  Constitutional: She is oriented to person, place, and time. She appears well-developed and well-nourished.  HENT:  Head: Normocephalic and atraumatic.  Right Ear: External ear normal.  Left Ear: External ear normal.  Mouth/Throat: Oropharynx is clear and moist.  Eyes: Conjunctivae and EOM are normal. Pupils are equal, round, and reactive to light.  Neck: Normal range of motion. Neck supple.  Cardiovascular: Normal rate, regular rhythm, normal heart sounds and intact distal pulses.  Pulmonary/Chest: Effort normal and breath sounds normal.  Abdominal: Soft. Bowel sounds are normal.  Neurological: She is alert and oriented to person, place, and time. She has normal reflexes.  Skin: Skin is warm and dry.  Psychiatric: She has a normal mood and affect. Her behavior is normal. Judgment and thought content normal.      BP (!) 134/92 (BP Location: Right Arm, Patient Position: Sitting, Cuff Size: Normal)   Pulse 83   Temp 98.3 F (36.8 C) (Oral)   Resp 16   Ht 5\' 2"  (1.575 m)   Wt 141 lb (64 kg)   LMP 10/17/2017   SpO2 96%   BMI 25.79 kg/m  Assessment & Plan:   1. Sickle  cell disease, type [place], with unspecified crisis (HCC) Sickle cell disease -  We will continue folic acid 1 mg daily to decrease the probability of vaso-occlusive crises. Pulmonary evaluation - Patient denies severe recurrent wheezes, shortness of breath with exercise, or persistent cough. If these symptoms develop, pulmonary function tests with spirometry will be ordered, and if abnormal, plan on referral to Pulmonology for further evaluation.  Cardiac -previously reviewed baseline EKG, within a normal range  Eye -[facility], with Dr. Carmela RimaNarendra Patel  Immunization status - Vaccination up to date  Acute and chronic painful episodes - We agreed on Oxycodone 10 mg today.   We discussed that pt is to receive her Schedule II prescriptions only from us. Pt is also aware that the prescription history is available to us online through the [facility] [place] [facility] CSRS. Patient will return for biweekly pill counts.  Controlled substance agreement signed 03/23/2017.  We reminded Hannah Hawkins that all patients receiving Schedule II narcotics must be seen for follow within one month of prescription being requested. We reviewed the terms of our pain agreement, including the need to keep medicines in a safe locked location away from children or pets, and the need to report excess sedation or constipation, measures to avoid constipation, and policies related to early refills and stolen prescriptions. According to the [place] Chronic Pain Initiative program, we have reviewed details related to analgesia, adverse effects, aberrant behaviors.   2.  Chronic prescription opiate use - Pain Mgmt, Profile 8 w/Conf,     RTC: 2 months for medication management and sickle cell anemia    The patient was given clear instructions to go to ER or return to medical center if symptoms do not improve, worsen or new problems develop. The patient verbalized understanding.    Hannah NationsLaChina Moore Imogene Gravelle  MSN,  FNP-C Patient Care [facility] [address]  [place], [place] [address] [phone]

## 2017-11-01 ENCOUNTER — Other Ambulatory Visit: Payer: Self-pay

## 2017-11-01 ENCOUNTER — Encounter (HOSPITAL_COMMUNITY): Payer: Self-pay | Admitting: Emergency Medicine

## 2017-11-01 DIAGNOSIS — Z880 Allergy status to penicillin: Secondary | ICD-10-CM

## 2017-11-01 DIAGNOSIS — D5700 Hb-SS disease with crisis, unspecified: Principal | ICD-10-CM | POA: Diagnosis present

## 2017-11-01 DIAGNOSIS — Z87891 Personal history of nicotine dependence: Secondary | ICD-10-CM

## 2017-11-01 DIAGNOSIS — J019 Acute sinusitis, unspecified: Secondary | ICD-10-CM | POA: Diagnosis present

## 2017-11-01 DIAGNOSIS — E860 Dehydration: Secondary | ICD-10-CM | POA: Diagnosis present

## 2017-11-01 DIAGNOSIS — Z79899 Other long term (current) drug therapy: Secondary | ICD-10-CM

## 2017-11-01 LAB — CBC WITH DIFFERENTIAL/PLATELET
BASOS PCT: 0 %
Basophils Absolute: 0 10*3/uL (ref 0.0–0.1)
EOS ABS: 0.4 10*3/uL (ref 0.0–0.7)
Eosinophils Relative: 6 %
HCT: 36 % (ref 36.0–46.0)
Hemoglobin: 13.5 g/dL (ref 12.0–15.0)
LYMPHS ABS: 2.7 10*3/uL (ref 0.7–4.0)
Lymphocytes Relative: 37 %
MCH: 27.4 pg (ref 26.0–34.0)
MCHC: 37.5 g/dL — AB (ref 30.0–36.0)
MCV: 73 fL — AB (ref 78.0–100.0)
MONO ABS: 0.4 10*3/uL (ref 0.1–1.0)
Monocytes Relative: 6 %
Neutro Abs: 3.7 10*3/uL (ref 1.7–7.7)
Neutrophils Relative %: 51 %
PLATELETS: 333 10*3/uL (ref 150–400)
RBC: 4.93 MIL/uL (ref 3.87–5.11)
RDW: 14.7 % (ref 11.5–15.5)
WBC: 7.2 10*3/uL (ref 4.0–10.5)

## 2017-11-01 LAB — COMPREHENSIVE METABOLIC PANEL
ALT: 19 U/L (ref 14–54)
AST: 21 U/L (ref 15–41)
Albumin: 4.2 g/dL (ref 3.5–5.0)
Alkaline Phosphatase: 53 U/L (ref 38–126)
Anion gap: 5 (ref 5–15)
BILIRUBIN TOTAL: 0.9 mg/dL (ref 0.3–1.2)
BUN: 6 mg/dL (ref 6–20)
CALCIUM: 9.3 mg/dL (ref 8.9–10.3)
CO2: 26 mmol/L (ref 22–32)
CREATININE: 0.75 mg/dL (ref 0.44–1.00)
Chloride: 105 mmol/L (ref 101–111)
GFR calc Af Amer: >60 mL/min (ref >60)
Glucose, Bld: 101 mg/dL — ABNORMAL HIGH (ref 65–99)
Potassium: 4.2 mmol/L (ref 3.5–5.1)
Sodium: 136 mmol/L (ref 135–145)
Total Protein: 7.4 g/dL (ref 6.5–8.1)

## 2017-11-01 LAB — RETICULOCYTES
RBC.: 4.93 MIL/uL (ref 3.87–5.11)
RETIC COUNT ABSOLUTE: 187.3 10*3/uL — AB (ref 19.0–186.0)
Retic Ct Pct: 3.8 % — ABNORMAL HIGH (ref 0.4–3.1)

## 2017-11-01 NOTE — ED Triage Notes (Signed)
Pt c/o having a sickle cell crisis with generalized body ache mostly on legs, back and arms with constantly diarrhea for a week, pt was seen yesterday by PCP states she is not getting better.

## 2017-11-02 ENCOUNTER — Encounter (HOSPITAL_COMMUNITY): Payer: Self-pay

## 2017-11-02 ENCOUNTER — Emergency Department (HOSPITAL_COMMUNITY): Payer: Medicaid Other

## 2017-11-02 ENCOUNTER — Inpatient Hospital Stay (HOSPITAL_COMMUNITY)
Admission: EM | Admit: 2017-11-02 | Discharge: 2017-11-05 | DRG: 812 | Disposition: A | Discharge status: Home / Self Care | Payer: Medicaid Other | Attending: Internal Medicine | Admitting: Internal Medicine

## 2017-11-02 DIAGNOSIS — Z880 Allergy status to penicillin: Secondary | ICD-10-CM | POA: Diagnosis not present

## 2017-11-02 DIAGNOSIS — D57219 Sickle-cell/Hb-C disease with crisis, unspecified: Secondary | ICD-10-CM | POA: Diagnosis not present

## 2017-11-02 DIAGNOSIS — Z79899 Other long term (current) drug therapy: Secondary | ICD-10-CM | POA: Diagnosis not present

## 2017-11-02 DIAGNOSIS — J019 Acute sinusitis, unspecified: Secondary | ICD-10-CM | POA: Diagnosis present

## 2017-11-02 DIAGNOSIS — Z87891 Personal history of nicotine dependence: Secondary | ICD-10-CM | POA: Diagnosis not present

## 2017-11-02 DIAGNOSIS — E860 Dehydration: Secondary | ICD-10-CM | POA: Diagnosis present

## 2017-11-02 DIAGNOSIS — D5700 Hb-SS disease with crisis, unspecified: Secondary | ICD-10-CM | POA: Diagnosis not present

## 2017-11-02 LAB — CBC
HEMATOCRIT: 32.5 % — AB (ref 36.0–46.0)
HEMOGLOBIN: 11.9 g/dL — AB (ref 12.0–15.0)
MCH: 26.9 pg (ref 26.0–34.0)
MCHC: 36.6 g/dL — AB (ref 30.0–36.0)
MCV: 73.5 fL — ABNORMAL LOW (ref 78.0–100.0)
Platelets: 262 10*3/uL (ref 150–400)
RBC: 4.42 MIL/uL (ref 3.87–5.11)
RDW: 14.8 % (ref 11.5–15.5)
WBC: 10.7 10*3/uL — ABNORMAL HIGH (ref 4.0–10.5)

## 2017-11-02 LAB — CREATININE, SERUM
CREATININE: 0.95 mg/dL (ref 0.44–1.00)
GFR calc Af Amer: >60 mL/min (ref >60)

## 2017-11-02 LAB — D-DIMER, QUANTITATIVE: D-Dimer, Quant: 0.8 ug/mL-FEU — ABNORMAL HIGH (ref 0.00–0.50)

## 2017-11-02 MED ORDER — KETOROLAC TROMETHAMINE 30 MG/ML IJ SOLN
30.0000 mg | INTRAMUSCULAR | Status: AC
  Administered 2017-11-02: 30 mg via INTRAVENOUS
  Filled 2017-11-02: qty 1

## 2017-11-02 MED ORDER — VITAMIN D (ERGOCALCIFEROL) 1.25 MG (50000 UNIT) PO CAPS
50000.0000 [IU] | ORAL_CAPSULE | ORAL | Status: DC
  Administered 2017-11-04: 50000 [IU] via ORAL
  Filled 2017-11-02: qty 1

## 2017-11-02 MED ORDER — POLYETHYLENE GLYCOL 3350 17 G PO PACK: 17.0000 g | PACK | Freq: Every day | ORAL | Status: DC | PRN

## 2017-11-02 MED ORDER — IOPAMIDOL (ISOVUE-370) INJECTION 76%
INTRAVENOUS | Status: AC
  Administered 2017-11-02: 100 mL
  Filled 2017-11-02: qty 100

## 2017-11-02 MED ORDER — NALOXONE HCL 0.4 MG/ML IJ SOLN: 0.4000 mg | INTRAMUSCULAR | Status: DC | PRN

## 2017-11-02 MED ORDER — HYDROMORPHONE HCL 1 MG/ML IJ SOLN
2.0000 mg | INTRAMUSCULAR | Status: AC
  Filled 2017-11-02: qty 2

## 2017-11-02 MED ORDER — MECLIZINE HCL 25 MG PO TABS
25.0000 mg | ORAL_TABLET | Freq: Three times a day (TID) | ORAL | Status: DC | PRN
Start: 2017-11-02 — End: 2017-11-05

## 2017-11-02 MED ORDER — ADULT MULTIVITAMIN W/MINERALS CH
1.0000 | ORAL_TABLET | Freq: Every day | ORAL | Status: DC
  Administered 2017-11-02 – 2017-11-05 (×4): 1 via ORAL
  Filled 2017-11-02 (×4): qty 1

## 2017-11-02 MED ORDER — IBUPROFEN 800 MG PO TABS: 800.0000 mg | ORAL_TABLET | Freq: Three times a day (TID) | ORAL | Status: DC | PRN

## 2017-11-02 MED ORDER — HYDROMORPHONE HCL 1 MG/ML IJ SOLN
2.0000 mg | INTRAMUSCULAR | Status: AC
  Administered 2017-11-02: 2 mg via INTRAVENOUS

## 2017-11-02 MED ORDER — SODIUM CHLORIDE 0.9% FLUSH: 9.0000 mL | INTRAVENOUS | Status: DC | PRN

## 2017-11-02 MED ORDER — HYDROMORPHONE HCL 1 MG/ML IJ SOLN
2.0000 mg | INTRAMUSCULAR | Status: AC
  Administered 2017-11-02: 2 mg via INTRAVENOUS
  Filled 2017-11-02: qty 2

## 2017-11-02 MED ORDER — ENOXAPARIN SODIUM 40 MG/0.4ML ~~LOC~~ SOLN
40.0000 mg | SUBCUTANEOUS | Status: DC
  Administered 2017-11-02 – 2017-11-04 (×3): 40 mg via SUBCUTANEOUS
  Filled 2017-11-02 (×3): qty 0.4

## 2017-11-02 MED ORDER — ONDANSETRON HCL 4 MG/2ML IJ SOLN
4.0000 mg | Freq: Once | INTRAMUSCULAR | Status: AC
  Administered 2017-11-02: 4 mg via INTRAVENOUS
  Filled 2017-11-02: qty 2

## 2017-11-02 MED ORDER — SENNOSIDES-DOCUSATE SODIUM 8.6-50 MG PO TABS
1.0000 | ORAL_TABLET | Freq: Two times a day (BID) | ORAL | Status: DC
  Administered 2017-11-02 – 2017-11-05 (×7): 1 via ORAL
  Filled 2017-11-02 (×7): qty 1

## 2017-11-02 MED ORDER — HYDROMORPHONE HCL 1 MG/ML IJ SOLN
1.0000 mg | Freq: Once | INTRAMUSCULAR | Status: AC
  Administered 2017-11-02: 1 mg via INTRAVENOUS
  Filled 2017-11-02: qty 1

## 2017-11-02 MED ORDER — HYDROMORPHONE 1 MG/ML IV SOLN
INTRAVENOUS | Status: DC
  Administered 2017-11-02: 3.9 mg via INTRAVENOUS
  Administered 2017-11-02: 2.7 mg via INTRAVENOUS
  Administered 2017-11-02: 12:00:00 via INTRAVENOUS
  Administered 2017-11-03: 2.1 mg via INTRAVENOUS
  Administered 2017-11-03: 1.2 mg via INTRAVENOUS
  Administered 2017-11-03: 2.1 mg via INTRAVENOUS
  Administered 2017-11-03: 2.7 mg via INTRAVENOUS
  Filled 2017-11-02: qty 25

## 2017-11-02 MED ORDER — DIPHENHYDRAMINE HCL 25 MG PO CAPS
25.0000 mg | ORAL_CAPSULE | ORAL | Status: DC | PRN
  Administered 2017-11-02: 25 mg via ORAL
  Filled 2017-11-02: qty 1

## 2017-11-02 MED ORDER — ONDANSETRON HCL 4 MG/2ML IJ SOLN
4.0000 mg | Freq: Four times a day (QID) | INTRAMUSCULAR | Status: DC | PRN
  Administered 2017-11-02 – 2017-11-03 (×2): 4 mg via INTRAVENOUS
  Filled 2017-11-02 (×2): qty 2

## 2017-11-02 MED ORDER — DEXTROSE-NACL 5-0.45 % IV SOLN
INTRAVENOUS | Status: DC
  Administered 2017-11-03 – 2017-11-04 (×5): via INTRAVENOUS

## 2017-11-02 MED ORDER — HYDROMORPHONE HCL 1 MG/ML IJ SOLN: 2.0000 mg | INTRAMUSCULAR | Status: AC

## 2017-11-02 MED ORDER — KETOROLAC TROMETHAMINE 30 MG/ML IJ SOLN
30.0000 mg | Freq: Four times a day (QID) | INTRAMUSCULAR | Status: DC
  Administered 2017-11-02 – 2017-11-05 (×12): 30 mg via INTRAVENOUS
  Filled 2017-11-02 (×12): qty 1

## 2017-11-02 MED ORDER — HYDROMORPHONE HCL 1 MG/ML IJ SOLN
0.5000 mg | Freq: Once | INTRAMUSCULAR | Status: AC
  Administered 2017-11-02: 0.5 mg via SUBCUTANEOUS
  Filled 2017-11-02: qty 1

## 2017-11-02 MED ORDER — DIPHENHYDRAMINE HCL 50 MG/ML IJ SOLN
12.5000 mg | Freq: Four times a day (QID) | INTRAMUSCULAR | Status: DC | PRN
  Administered 2017-11-03: 12.5 mg via INTRAVENOUS
  Filled 2017-11-02: qty 1

## 2017-11-02 MED ORDER — ONDANSETRON 4 MG PO TBDP
4.0000 mg | ORAL_TABLET | Freq: Once | ORAL | Status: AC
  Administered 2017-11-02: 4 mg via ORAL
  Filled 2017-11-02: qty 1

## 2017-11-02 MED ORDER — DIPHENHYDRAMINE HCL 12.5 MG/5ML PO ELIX
12.5000 mg | ORAL_SOLUTION | Freq: Four times a day (QID) | ORAL | Status: DC | PRN
  Administered 2017-11-02: 12.5 mg via ORAL
  Filled 2017-11-02: qty 5

## 2017-11-02 MED ORDER — NALOXEGOL OXALATE 12.5 MG PO TABS
12.5000 mg | ORAL_TABLET | Freq: Every day | ORAL | Status: DC
  Administered 2017-11-02 – 2017-11-05 (×4): 12.5 mg via ORAL
  Filled 2017-11-02 (×5): qty 1

## 2017-11-02 MED ORDER — HYDROMORPHONE HCL 1 MG/ML IJ SOLN
2.0000 mg | INTRAMUSCULAR | Status: AC
  Administered 2017-11-02 (×2): 2 mg via INTRAVENOUS
  Filled 2017-11-02: qty 2

## 2017-11-02 MED ORDER — PROMETHAZINE HCL 25 MG/ML IJ SOLN
12.5000 mg | Freq: Once | INTRAMUSCULAR | Status: AC
  Administered 2017-11-02: 12.5 mg via INTRAVENOUS
  Filled 2017-11-02: qty 1

## 2017-11-02 NOTE — ED Notes (Signed)
Patient up to desk.  Stating pain is severe.  Patient also laughing with friend and eating.  Will provide protocol pain medication.

## 2017-11-02 NOTE — H&P (Signed)
Hannah Hawkins is an 31 y.o. female.   Chief Complaint: pain in legs him back  HPI: patient is a 31 year old female with  Known history of sickle cell [place] disease presenting with 1 day history of pain in her back and legs.  She was seen at [facility] emergency room where she received a total of 6 mg of Dilaudid but no relief. She is therefore being admitted with Sickle cell painful crisis. No NVD, no fever or chills.  Past Medical History:  Diagnosis Date  . Sickle cell anemia (HCC)     Past Surgical History:  Procedure Laterality Date  . EYE SURGERY  03/09/2017   left eye    History reviewed. No pertinent family history. Social History:  reports that she has quit smoking. she has never used smokeless tobacco. She reports that she drinks alcohol. She reports that she does not use drugs.  Allergies:  Allergies  Allergen Reactions  . Penicillins Swelling and Rash    Has patient had a PCN reaction causing immediate rash, facial/tongue/throat swelling, SOB or lightheadedness with hypotension: Yes Has patient had a PCN reaction causing severe rash involving mucus membranes or skin necrosis: Yes Has patient had a PCN reaction that required hospitalization No Has patient had a PCN reaction occurring within the last 10 years: no If all of the above answers are "NO", then may proceed with Cephalosporin use.      (Not in a hospital admission)  Results for orders placed or performed during the hospital encounter of 11/02/17 (from the past 48 hour(s))  Comprehensive metabolic panel     Status: Abnormal   Collection Time: 11/01/17  9:43 PM  Result Value Ref Range   Sodium 136 135 - 145 mmol/L   Potassium 4.2 3.5 - 5.1 mmol/L   Chloride 105 101 - 111 mmol/L   CO2 26 22 - 32 mmol/L   Glucose, Bld 101 (H) 65 - 99 mg/dL   BUN 6 6 - 20 mg/dL   Creatinine, Ser 0.75 0.44 - 1.00 mg/dL   Calcium 9.3 8.9 - 10.3 mg/dL   Total Protein 7.4 6.5 - 8.1 g/dL   Albumin 4.2 3.5 - 5.0 g/dL   AST 21  15 - 41 U/L   ALT 19 14 - 54 U/L   Alkaline Phosphatase 53 38 - 126 U/L   Total Bilirubin 0.9 0.3 - 1.2 mg/dL   GFR calc non Af Amer >60 >60 mL/min   GFR calc Af Amer >60 >60 mL/min    Comment: (NOTE) The eGFR has been calculated using the CKD EPI equation. This calculation has not been validated in all clinical situations. eGFR's persistently <60 mL/min signify possible Chronic Kidney Disease.    Anion gap 5 5 - 15  CBC with Differential     Status: Abnormal   Collection Time: 11/01/17  9:43 PM  Result Value Ref Range   WBC 7.2 4.0 - 10.5 K/uL   RBC 4.93 3.87 - 5.11 MIL/uL   Hemoglobin 13.5 12.0 - 15.0 g/dL   HCT 36.0 36.0 - 46.0 %   MCV 73.0 (L) 78.0 - 100.0 fL   MCH 27.4 26.0 - 34.0 pg   MCHC 37.5 (H) 30.0 - 36.0 g/dL    Comment: Sickle cells present   RDW 14.7 11.5 - 15.5 %   Platelets 333 150 - 400 K/uL   Neutrophils Relative % 51 %   Lymphocytes Relative 37 %   Monocytes Relative 6 %   Eosinophils Relative 6 %  Basophils Relative 0 %   Neutro Abs 3.7 1.7 - 7.7 K/uL   Lymphs Abs 2.7 0.7 - 4.0 K/uL   Monocytes Absolute 0.4 0.1 - 1.0 K/uL   Eosinophils Absolute 0.4 0.0 - 0.7 K/uL   Basophils Absolute 0.0 0.0 - 0.1 K/uL   RBC Morphology TARGET CELLS     Comment: Sickle cells present RARE NRBCs    WBC Morphology ATYPICAL LYMPHOCYTES   Reticulocytes     Status: Abnormal   Collection Time: 11/01/17  9:43 PM  Result Value Ref Range   Retic Ct Pct 3.8 (H) 0.4 - 3.1 %   RBC. 4.93 3.87 - 5.11 MIL/uL   Retic Count, Absolute 187.3 (H) 19.0 - 186.0 K/uL  D-dimer, quantitative (not at [facility])     Status: Abnormal   Collection Time: 11/02/17  4:14 AM  Result Value Ref Range   D-Dimer, Quant 0.80 (H) 0.00 - 0.50 ug/mL-FEU    Comment: (NOTE) At the manufacturer cut-off of 0.50 ug/mL FEU, this assay has been documented to exclude PE with a sensitivity and negative predictive value of 97 to 99%.  At this time, this assay has not been approved by the FDA to exclude  DVT/VTE. Results should be correlated with clinical presentation.    Dg Chest 2 View  Result Date: 11/02/2017 CLINICAL DATA:  Acute onset of shortness of breath and generalized chest discomfort. Sickle cell pain crisis. EXAM: CHEST  2 VIEW COMPARISON:  Chest radiograph and CTA of the chest performed 03/24/2017 FINDINGS: The lungs are well-aerated and clear. There is no evidence of focal opacification, pleural effusion or pneumothorax. The heart is normal in size; the mediastinal contour is within normal limits. No acute osseous abnormalities are seen. IMPRESSION: No acute cardiopulmonary process seen. Electronically Signed   By: Garald Balding M.D.   On: 11/02/2017 05:02   Ct Angio Chest Pe W And/or Wo Contrast  Result Date: 11/02/2017 CLINICAL DATA:  Acute onset of generalized body ache, diarrhea and pleuritic chest pain. Sickle cell pain crisis. EXAM: CT ANGIOGRAPHY CHEST WITH CONTRAST TECHNIQUE: Multidetector CT imaging of the chest was performed using the standard protocol during bolus administration of intravenous contrast. Multiplanar CT image reconstructions and MIPs were obtained to evaluate the vascular anatomy. CONTRAST:  123m ISOVUE-370 IOPAMIDOL (ISOVUE-370) INJECTION 76% COMPARISON:  Chest radiograph performed earlier today at 4:39 a.m., and CTA of the chest performed 03/24/2017 FINDINGS: Cardiovascular:  There is no evidence of pulmonary embolus. The heart is normal in size. The great vessels are unremarkable in appearance. Mediastinum/Nodes: The mediastinum is unremarkable. No mediastinal lymphadenopathy is seen. No pericardial effusion is identified. The visualized portions of the thyroid gland are unremarkable. No axillary lymphadenopathy is seen. Lungs/Pleura: Minimal bibasilar atelectasis is noted. No pleural effusion or pneumothorax is seen. No masses are identified. Upper Abdomen: The visualized portions of the liver and spleen are unremarkable. The visualized portions of the  pancreas, gallbladder, adrenal glands and kidneys are within normal limits. Musculoskeletal: No acute osseous abnormalities are identified. The visualized musculature is unremarkable in appearance. Review of the MIP images confirms the above findings. IMPRESSION: No evidence of pulmonary embolus. Minimal bibasilar atelectasis noted; lungs otherwise clear. Electronically Signed   By: JGarald BaldingM.D.   On: 11/02/2017 06:45    Review of Systems  Constitutional: Negative.   HENT: Negative.   Eyes: Negative.   Respiratory: Negative.   Cardiovascular: Negative.   Gastrointestinal: Negative.   Genitourinary: Negative.   Musculoskeletal: Positive for back pain and myalgias.  Skin: Negative.   Neurological: Negative.   Endo/Heme/Allergies: Negative.   Psychiatric/Behavioral: Negative.     Blood pressure 125/72, pulse 80, temperature 98 F (36.7 C), temperature source Oral, resp. rate 11, height _0  (1.575 m), weight 64.4 kg (142 lb), last menstrual period 10/17/2017, SpO2 96 %. Physical Exam  Constitutional: She is oriented to person, place, and time. She appears well-developed and well-nourished.  HENT:  Head: Normocephalic and atraumatic.  Eyes: Conjunctivae are normal. Pupils are equal, round, and reactive to light.  Neck: Normal range of motion. Neck supple.  Cardiovascular: Normal rate, regular rhythm and normal heart sounds.  Respiratory: Effort normal.  GI: Soft. Bowel sounds are normal.  Musculoskeletal: Normal range of motion. She exhibits tenderness.  Neurological: She is alert and oriented to person, place, and time.  Skin: Skin is warm and dry.  Psychiatric: She has a normal mood and affect.     Assessment/Plan A 31 year old female with known sickle  [place] disease admitted with sickle cell painful crisis.  #1 sickle cell painful crisis: Patient will be admitted for inpatient care.  Start Dilaudid PCA with Toradol and IVF. Patient is opiate nave.  We will gradually  transition to or before discharge.  #2 sickle cell anemia: Monitor H&H closely.  Appears stable at the moment.  #3 dehydration: Patient will be hydrated and we'll follow electrolytes closely.  Barbette Merino, MD 11/02/2017, 8:34 AM

## 2017-11-02 NOTE — ED Notes (Signed)
Pt. To XRAY via stretcher. 

## 2017-11-02 NOTE — ED Notes (Signed)
Pt. To CT via stretcher. 

## 2017-11-02 NOTE — ED Provider Notes (Signed)
[facility] Provider Note   CSN: [id number] Arrival date & time: 11/01/17  2118     History   Chief Complaint Chief Complaint  Patient presents with  . Sickle Cell Pain Crisis    HPI Hannah Hawkins is a 31 y.o. female.  Patient presents to the emergency department with a chief complaint of sickle cell pain crisis.  She states that the pain started in her right leg and she also has some in her left shoulder.  She states that she generally takes Percocet, but has run out of her pain medication.  Additionally, she reports some tightness in her chest and some shortness of breath when she takes a deep breath.  She denies any fever, or chills.  She does report a dry cough.  She denies any abdominal pain, nausea, or vomiting.  She states that she also has some dizziness, which she describes as room is spinning.  She states this is normal for her, especially prior to sickle cell crises.  She states that she generally takes meclizine for this.   The history is provided by the patient. No language interpreter was used.    Past Medical History:  Diagnosis Date  . Sickle cell anemia [facility](HCC)     Patient Active Problem List   Diagnosis Date Noted  . Sickle cell disease, type [place], with unspecified crisis (HCC) 02/18/2017    Past Surgical History:  Procedure Laterality Date  . EYE SURGERY  03/09/2017   left eye    OB History    No data available       Home Medications    Prior to Admission medications   Medication Sig Start Date End Date Taking? Authorizing Provider  ergocalciferol (VITAMIN D2) 50000 units capsule Take 1 capsule (50,000 Units total) by mouth once a week. 02/22/17  Yes Massie MaroonHollis, [name], FNP  ibuprofen (ADVIL,MOTRIN) 800 MG tablet Take 1 tablet (800 mg total) by mouth every 8 (eight) hours as needed for moderate pain. 06/27/17  Yes Massie MaroonHollis, [name], FNP  Multiple Vitamin (MULTIVITAMIN) capsule Take 1 capsule daily by mouth.    Yes [provider]  naloxegol oxalate (MOVANTIK) 12.5 MG TABS tablet Take 1 tablet (12.5 mg total) by mouth daily. Patient taking differently: Take 12.5 mg by mouth daily as needed (pain).  08/04/17  Yes Massie MaroonHollis, [name], FNP  ondansetron (ZOFRAN) 4 MG tablet Take 1 tablet (4 mg total) by mouth every 8 (eight) hours as needed for nausea or vomiting. 09/30/17  Yes Massie MaroonHollis, [name], FNP  folic acid (FOLVITE) 1 MG tablet Take 1 tablet (1 mg total) by mouth daily. Patient not taking: Reported on 10/31/2017 08/04/17   Massie MaroonHollis, [name], FNP  meclizine (ANTIVERT) 25 MG tablet Take 1 tablet (25 mg total) 3 (three) times daily as needed by mouth for dizziness. 10/31/17   Massie MaroonHollis, [name], FNP  ondansetron (ZOFRAN) 4 MG tablet Take 1 tablet (4 mg total) by mouth every 8 (eight) hours as needed for nausea or vomiting. Patient not taking: Reported on 11/01/2017 09/30/17   Massie MaroonHollis, [name], FNP  Oxycodone HCl 10 MG TABS Take 1 tablet (10 mg total) every 6 (six) hours as needed by mouth. Patient not taking: Reported on 11/01/2017 10/28/17   Massie MaroonHollis, [name], FNP    Family History History reviewed. No pertinent family history.  Social History Social History   Tobacco Use  . Smoking status: Former Games developermoker  . Smokeless tobacco: Never Used  Substance  Use Topics  . Alcohol use: Yes    Comment: Twice a month  . Drug use: No     Allergies   Penicillins   Review of Systems Review of Systems  All other systems reviewed and are negative.    Physical Exam Updated Vital Signs BP 110/83 (BP Location: Right Arm)   Pulse 86   Temp 98 F (36.7 C) (Oral)   Resp 16   Ht 5\' 2"  (1.575 m)   Wt 64 kg (141 lb)   LMP 10/17/2017   SpO2 99%   BMI 25.79 kg/m   Physical Exam  Constitutional: She is oriented to person, place, and time. She appears well-developed and well-nourished.  HENT:  Head: Normocephalic and atraumatic.  Eyes: Conjunctivae and EOM are normal. Pupils are equal, round,  and reactive to light.  Neck: Normal range of motion. Neck supple.  Cardiovascular: Normal rate and regular rhythm. Exam reveals no gallop and no friction rub.  No murmur heard. Pulmonary/Chest: Effort normal and breath sounds normal. No respiratory distress. She has no wheezes. She has no rales. She exhibits no tenderness.  Clear to auscultation bilaterally  Abdominal: Soft. Bowel sounds are normal. She exhibits no distension and no mass. There is no tenderness. There is no rebound and no guarding.  Musculoskeletal: Normal range of motion. She exhibits no edema or tenderness.  Neurological: She is alert and oriented to person, place, and time.  Skin: Skin is warm and dry.  No swollen or erythematous joints  Psychiatric: She has a normal mood and affect. Her behavior is normal. Judgment and thought content normal.  Nursing note and vitals reviewed.    ED Treatments / Results  Labs (all labs ordered are listed, but only abnormal results are displayed) Labs Reviewed  COMPREHENSIVE METABOLIC PANEL - Abnormal; Notable for the following components:      Result Value   Glucose, Bld 101 (*)    All other components within normal limits  CBC WITH DIFFERENTIAL/PLATELET - Abnormal; Notable for the following components:   MCV 73.0 (*)    MCHC 37.5 (*)    All other components within normal limits  RETICULOCYTES - Abnormal; Notable for the following components:   Retic Ct Pct 3.8 (*)    Retic Count, Absolute 187.3 (*)    All other components within normal limits  D-DIMER, QUANTITATIVE (NOT AT [facility])  POC URINE PREG, ED    EKG  EKG Interpretation None       Radiology No results found.  Procedures Procedures (including critical care time)  Medications Ordered in ED Medications  ketorolac (TORADOL) 30 MG/ML injection 30 mg (not administered)  HYDROmorphone (DILAUDID) injection 2 mg (not administered)    Or  HYDROmorphone (DILAUDID) injection 2 mg (not administered)  HYDROmorphone  (DILAUDID) injection 2 mg (not administered)    Or  HYDROmorphone (DILAUDID) injection 2 mg (not administered)  HYDROmorphone (DILAUDID) injection 2 mg (not administered)    Or  HYDROmorphone (DILAUDID) injection 2 mg (not administered)  HYDROmorphone (DILAUDID) injection 2 mg (not administered)    Or  HYDROmorphone (DILAUDID) injection 2 mg (not administered)  diphenhydrAMINE (BENADRYL) capsule 25-50 mg (not administered)  HYDROmorphone (DILAUDID) injection 0.5 mg (0.5 mg Subcutaneous Given 11/02/17 0144)  ondansetron (ZOFRAN-ODT) disintegrating tablet 4 mg (4 mg Oral Given 11/02/17 0147)     Initial Impression / Assessment and Plan / ED Course  I have reviewed the triage vital signs and the nursing notes.  Pertinent labs & imaging results that were  available during my care of the patient were reviewed by me and considered in my medical decision making (see chart for details).     Patient with sickle cell pain crisis.  Also with some CP and SOB.  CT PE study negative.  Will admit to sickle cell medicine.   Signed out to [employer].  Final Clinical Impressions(s) / ED Diagnoses   Final diagnoses:  None    ED Discharge Orders    None       Roxy Horseman, PA-C 11/02/17 0725    Glynn Octave, MD 11/02/17 [phone]

## 2017-11-03 LAB — CBC WITH DIFFERENTIAL/PLATELET
Basophils Absolute: 0 10*3/uL (ref 0.0–0.1)
Basophils Relative: 0 %
EOS PCT: 8 %
Eosinophils Absolute: 0.9 10*3/uL — ABNORMAL HIGH (ref 0.0–0.7)
HCT: 29.3 % — ABNORMAL LOW (ref 36.0–46.0)
HEMOGLOBIN: 10.8 g/dL — AB (ref 12.0–15.0)
Lymphocytes Relative: 34 %
Lymphs Abs: 3.9 10*3/uL (ref 0.7–4.0)
MCH: 27.1 pg (ref 26.0–34.0)
MCHC: 36.9 g/dL — AB (ref 30.0–36.0)
MCV: 73.4 fL — ABNORMAL LOW (ref 78.0–100.0)
MONOS PCT: 9 %
Monocytes Absolute: 1 10*3/uL (ref 0.1–1.0)
NEUTROS ABS: 5.8 10*3/uL (ref 1.7–7.7)
NRBC: 2 /100{WBCs} — AB
Neutrophils Relative %: 49 %
Platelets: 286 10*3/uL (ref 150–400)
RBC: 3.99 MIL/uL (ref 3.87–5.11)
RDW: 14.8 % (ref 11.5–15.5)
WBC: 11.6 10*3/uL — ABNORMAL HIGH (ref 4.0–10.5)

## 2017-11-03 LAB — COMPREHENSIVE METABOLIC PANEL
ALBUMIN: 3.9 g/dL (ref 3.5–5.0)
ALT: 24 U/L (ref 14–54)
ANION GAP: 6 (ref 5–15)
AST: 35 U/L (ref 15–41)
Alkaline Phosphatase: 52 U/L (ref 38–126)
BILIRUBIN TOTAL: 0.9 mg/dL (ref 0.3–1.2)
BUN: 9 mg/dL (ref 6–20)
CALCIUM: 8.8 mg/dL — AB (ref 8.9–10.3)
CO2: 26 mmol/L (ref 22–32)
Chloride: 103 mmol/L (ref 101–111)
Creatinine, Ser: 0.91 mg/dL (ref 0.44–1.00)
GFR calc Af Amer: >60 mL/min (ref >60)
GFR calc non Af Amer: >60 mL/min (ref >60)
GLUCOSE: 107 mg/dL — AB (ref 65–99)
Potassium: 3.9 mmol/L (ref 3.5–5.1)
SODIUM: 135 mmol/L (ref 135–145)
TOTAL PROTEIN: 7 g/dL (ref 6.5–8.1)

## 2017-11-03 LAB — RETICULOCYTES
RBC.: 4.41 MIL/uL (ref 3.87–5.11)
Retic Count, Absolute: 185.2 10*3/uL (ref 19.0–186.0)
Retic Ct Pct: 4.2 % — ABNORMAL HIGH (ref 0.4–3.1)

## 2017-11-03 MED ORDER — DIPHENHYDRAMINE HCL 12.5 MG/5ML PO ELIX
12.5000 mg | ORAL_SOLUTION | Freq: Four times a day (QID) | ORAL | Status: DC | PRN
  Administered 2017-11-03 – 2017-11-05 (×6): 12.5 mg via ORAL
  Filled 2017-11-03 (×6): qty 5

## 2017-11-03 MED ORDER — HYDROMORPHONE 1 MG/ML IV SOLN
INTRAVENOUS | Status: DC
  Administered 2017-11-03: 8.4 mg via INTRAVENOUS
  Administered 2017-11-03: 11:00:00 via INTRAVENOUS
  Administered 2017-11-03: 4.5 mg via INTRAVENOUS
  Administered 2017-11-03: 2 mg via INTRAVENOUS
  Administered 2017-11-04: 4.5 mg via INTRAVENOUS
  Administered 2017-11-04: 11:00:00 via INTRAVENOUS
  Administered 2017-11-04: 7 mg via INTRAVENOUS
  Administered 2017-11-04: 3 mg via INTRAVENOUS
  Administered 2017-11-04: 3.5 mg via INTRAVENOUS
  Administered 2017-11-05: 0 mg via INTRAVENOUS
  Administered 2017-11-05 (×2): 4 mg via INTRAVENOUS
  Filled 2017-11-03 (×2): qty 25

## 2017-11-03 MED ORDER — DIPHENHYDRAMINE HCL 50 MG/ML IJ SOLN: 12.5000 mg | Freq: Four times a day (QID) | INTRAMUSCULAR | Status: DC | PRN

## 2017-11-03 MED ORDER — ONDANSETRON HCL 4 MG/2ML IJ SOLN
4.0000 mg | Freq: Four times a day (QID) | INTRAMUSCULAR | Status: DC | PRN
  Administered 2017-11-03 – 2017-11-05 (×5): 4 mg via INTRAVENOUS
  Filled 2017-11-03 (×5): qty 2

## 2017-11-03 MED ORDER — IBUPROFEN 800 MG PO TABS: 800.0000 mg | ORAL_TABLET | Freq: Three times a day (TID) | ORAL | Status: DC | PRN

## 2017-11-03 MED ORDER — NALOXONE HCL 0.4 MG/ML IJ SOLN: 0.4000 mg | INTRAMUSCULAR | Status: DC | PRN

## 2017-11-03 MED ORDER — SODIUM CHLORIDE 0.9% FLUSH: 9.0000 mL | INTRAVENOUS | Status: DC | PRN

## 2017-11-03 NOTE — Progress Notes (Signed)
PHARMACIST - PHYSICIAN COMMUNICATION  DR:  Mikeal HawthorneGARBA CONCERNING: IV to Oral Route Change Policy  RECOMMENDATION: This patient is receiving diphenhydramine by the intravenous route.  Based on criteria approved by the Pharmacy and Therapeutics Committee, intravenous diphenhydramine is being converted to the equivalent oral dose form(s). NOTE: Patient ordered both IV and PO diphenhydramine, and the IV form is being discontinued as per criteria below.   DESCRIPTION: These criteria include:  Diphenhydramine is not prescribed to treat or prevent a severe allergic reaction  Diphenhydramine is not prescribed as premedication prior to receiving blood product, biologic medication, antimicrobial, or chemotherapy agent  The patient has tolerated at least one dose of an oral or enteral medication  The patient has no evidence of active gastrointestinal bleeding or impaired GI absorption (gastrectomy, short bowel, patient on TNA or NPO).  The patient is not undergoing procedural sedation   If you have questions about this conversion, please contact the Pharmacy Department  []   [phone]( [phone] )  [facility] []   [phone]( [phone] )  [facility] []   [phone]( [phone]  [facility] []   [phone]( [phone] )  [facility] [x]   [phone]( [phone] )  [facility]     Greer PickerelJigna Yerania Chamorro, PharmD, [place] Pager: [phone] 11/03/2017 9:45 AM

## 2017-11-03 NOTE — Progress Notes (Signed)
Subjective: Patient was admitted yesterday with sickle cell painful crisis. She is still having pain as 7 out of 10. She is on the Dilaudid PCA and use 25 mg was 52 dimensions and 50 deliveries in the last 24 hours. She denied any nausea or vomiting. Hemoglobin is still stable. Patient is using the PCA with Toradol.  Objective: Vital signs in last 24 hours: Temp:  [97.8 F (36.6 C)-98.4 F (36.9 C)] 98.1 F (36.7 C) (11/15 1508) Pulse Rate:  [67-101] 82 (11/15 1508) Resp:  [9-18] 12 (11/15 1508) BP: (117-134)/(58-78) 134/58 (11/15 1508) SpO2:  [93 %-100 %] 99 % (11/15 1508) Weight:  [64.9 kg (143 lb)] 64.9 kg (143 lb) (11/15 0514) Weight change: 1.743 kg (3 lb 13.5 oz) Last BM Date: 10/31/17  Intake/Output from previous day: 11/14 0701 - 11/15 0700 In: 365 [P.O.:120; I.V.:245] Out: -  Intake/Output this shift: Total I/O In: 350 [P.O.:350] Out: -   General appearance: alert, cooperative, appears stated age and no distress Head: Normocephalic, without obvious abnormality, atraumatic Back: symmetric, no curvature. ROM normal. No CVA tenderness. Resp: clear to auscultation bilaterally Chest wall: no tenderness Cardio: regular rate and rhythm, S1, S2 normal, no murmur, click, rub or gallop GI: soft, non-tender; bowel sounds normal; no masses,  no organomegaly Extremities: extremities normal, atraumatic, no cyanosis or edema Pulses: 2+ and symmetric Skin: Skin color, texture, turgor normal. No rashes or lesions Neurologic: Grossly normal  Lab Results: Recent Labs    11/02/17 1122 11/03/17 1033  WBC 10.7* 11.6*  HGB 11.9* 10.8*  HCT 32.5* 29.3*  PLT 262 286   BMET Recent Labs    11/01/17 2143 11/02/17 1122 11/03/17 0408  NA 136  --  135  K 4.2  --  3.9  CL 105  --  103  CO2 26  --  26  GLUCOSE 101*  --  107*  BUN 6  --  9  CREATININE 0.75 0.95 0.91  CALCIUM 9.3  --  8.8*    Studies/Results: Dg Chest 2 View  Result Date: 11/02/2017 CLINICAL DATA:  Acute  onset of shortness of breath and generalized chest discomfort. Sickle cell pain crisis. EXAM: CHEST  2 VIEW COMPARISON:  Chest radiograph and CTA of the chest performed 03/24/2017 FINDINGS: The lungs are well-aerated and clear. There is no evidence of focal opacification, pleural effusion or pneumothorax. The heart is normal in size; the mediastinal contour is within normal limits. No acute osseous abnormalities are seen. IMPRESSION: No acute cardiopulmonary process seen. Electronically Signed   By: Roanna RaiderJeffery  Chang M.D.   On: 11/02/2017 05:02   Ct Angio Chest Pe W And/or Wo Contrast  Result Date: 11/02/2017 CLINICAL DATA:  Acute onset of generalized body ache, diarrhea and pleuritic chest pain. Sickle cell pain crisis. EXAM: CT ANGIOGRAPHY CHEST WITH CONTRAST TECHNIQUE: Multidetector CT imaging of the chest was performed using the standard protocol during bolus administration of intravenous contrast. Multiplanar CT image reconstructions and MIPs were obtained to evaluate the vascular anatomy. CONTRAST:  100mL ISOVUE-370 IOPAMIDOL (ISOVUE-370) INJECTION 76% COMPARISON:  Chest radiograph performed earlier today at 4:39 a.m., and CTA of the chest performed 03/24/2017 FINDINGS: Cardiovascular:  There is no evidence of pulmonary embolus. The heart is normal in size. The great vessels are unremarkable in appearance. Mediastinum/Nodes: The mediastinum is unremarkable. No mediastinal lymphadenopathy is seen. No pericardial effusion is identified. The visualized portions of the thyroid gland are unremarkable. No axillary lymphadenopathy is seen. Lungs/Pleura: Minimal bibasilar atelectasis is noted. No pleural effusion  or pneumothorax is seen. No masses are identified. Upper Abdomen: The visualized portions of the liver and spleen are unremarkable. The visualized portions of the pancreas, gallbladder, adrenal glands and kidneys are within normal limits. Musculoskeletal: No acute osseous abnormalities are identified. The  visualized musculature is unremarkable in appearance. Review of the MIP images confirms the above findings. IMPRESSION: No evidence of pulmonary embolus. Minimal bibasilar atelectasis noted; lungs otherwise clear. Electronically Signed   By: Roanna RaiderJeffery  Chang M.D.   On: 11/02/2017 06:45    Medications: I have reviewed the patient's current medications.  Assessment/Plan: A 31 FEMALE admitted with sickle cell painful crisis.  #1 sickle cell painful crisis: Patient appears to be doing better but still in pain. Continue current regimen of Dilaudid PCA with Toradol and IV fluids. Mobilize patient.  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 Hemoglobin stable. We will keep monitoring.  #3 leukocytosis: Secondary to sickle cell crisis. Continue monitoring.  #4 dehydration: Continue aggressive hydration.   LOS: 1 day   GARBA,LAWAL 11/03/2017, 3:16 PM

## 2017-11-04 MED ORDER — OXYCODONE HCL 5 MG PO TABS
10.0000 mg | ORAL_TABLET | Freq: Four times a day (QID) | ORAL | Status: DC
  Administered 2017-11-04 – 2017-11-05 (×4): 10 mg via ORAL
  Filled 2017-11-04 (×4): qty 2

## 2017-11-04 MED ORDER — SALINE SPRAY 0.65 % NA SOLN
1.0000 | NASAL | Status: DC | PRN
  Filled 2017-11-04: qty 44

## 2017-11-04 MED ORDER — LORATADINE 10 MG PO TABS
10.0000 mg | ORAL_TABLET | Freq: Every day | ORAL | Status: DC
  Administered 2017-11-04 – 2017-11-05 (×2): 10 mg via ORAL
  Filled 2017-11-04 (×2): qty 1

## 2017-11-04 MED ORDER — FLUTICASONE PROPIONATE 50 MCG/ACT NA SUSP
1.0000 | Freq: Every day | NASAL | Status: DC
  Administered 2017-11-04 – 2017-11-05 (×2): 1 via NASAL
  Filled 2017-11-04: qty 16

## 2017-11-04 MED ORDER — OXYMETAZOLINE HCL 0.05 % NA SOLN
1.0000 | Freq: Two times a day (BID) | NASAL | Status: DC
  Administered 2017-11-04 – 2017-11-05 (×3): 1 via NASAL
  Filled 2017-11-04: qty 15

## 2017-11-04 NOTE — Progress Notes (Signed)
Subjective: Patient Complaining of nasal stuffiness and not able to breathe. She has congestion with sinus headaches today. Her sickle cell pain is down to 5 out of 10. She has used 28 mg of the Dilaudid was 62 demands a 61 deliveries in the last 24 hours. Also continues to get Toradol. She is relatively opiate nave. No fever or chills.  Objective: Vital signs in last 24 hours: Temp:  [97.5 F (36.4 C)-98.3 F (36.8 C)] 98.3 F (36.8 C) (11/16 1536) Pulse Rate:  [74-88] 88 (11/16 1536) Resp:  [10-19] 12 (11/16 1633) BP: (117-138)/(61-88) 131/88 (11/16 1536) SpO2:  [95 %-100 %] 98 % (11/16 1633) Weight change:  Last BM Date: 10/31/17  Intake/Output from previous day: 11/15 0701 - 11/16 0700 In: 3546.7 [P.O.:1105; I.V.:2441.7] Out: -  Intake/Output this shift: No intake/output data recorded.  General appearance: alert, cooperative, appears stated age and no distress Head: Normocephalic, without obvious abnormality, atraumatic Back: symmetric, no curvature. ROM normal. No CVA tenderness. Resp: clear to auscultation bilaterally Chest wall: no tenderness Cardio: regular rate and rhythm, S1, S2 normal, no murmur, click, rub or gallop GI: soft, non-tender; bowel sounds normal; no masses,  no organomegaly Extremities: extremities normal, atraumatic, no cyanosis or edema Pulses: 2+ and symmetric Skin: Skin color, texture, turgor normal. No rashes or lesions Neurologic: Grossly normal  Lab Results: Recent Labs    11/02/17 1122 11/03/17 1033  WBC 10.7* 11.6*  HGB 11.9* 10.8*  HCT 32.5* 29.3*  PLT 262 286   BMET Recent Labs    11/01/17 2143 11/02/17 1122 11/03/17 0408  NA 136  --  135  K 4.2  --  3.9  CL 105  --  103  CO2 26  --  26  GLUCOSE 101*  --  107*  BUN 6  --  9  CREATININE 0.75 0.95 0.91  CALCIUM 9.3  --  8.8*    Studies/Results: No results found.  Medications: I have reviewed the patient's current medications.  Assessment/Plan: A 31 FEMALE admitted  with sickle cell painful crisis.  #1 sickle cell painful crisis: Patient appears to be doing better. Continue current regimen of Dilaudid PCA with Toradol and IV fluids. Will restart oral pain medication and per patient for possible discharge tomorrow.  #2 sickle cell anemia: Patient has [place] disease. Hemoglobin stable. We will keep monitoring.  #3 leukocytosis: Secondary to sickle cell crisis. Continue monitoring.  #4 dehydration: Continue hydration.  #5 acute sinusitis: Patient has generalized congestion. I will initiate nasal steroids as well as Claritin   LOS: 2 days   Briana Newman,LAWAL 11/04/2017, 6:44 PM

## 2017-11-05 DIAGNOSIS — D57219 Sickle-cell/Hb-C disease with crisis, unspecified: Secondary | ICD-10-CM

## 2017-11-05 MED ORDER — PANTOPRAZOLE SODIUM 40 MG PO TBEC
40.0000 mg | DELAYED_RELEASE_TABLET | Freq: Every day | ORAL | Status: DC
  Administered 2017-11-05: 40 mg via ORAL
  Filled 2017-11-05: qty 1

## 2017-11-05 MED ORDER — ALUM & MAG HYDROXIDE-SIMETH 200-200-20 MG/5ML PO SUSP
15.0000 mL | Freq: Once | ORAL | Status: AC
  Administered 2017-11-05: 30 mL via ORAL

## 2017-11-05 NOTE — Discharge Summary (Signed)
Hannah Hawkins MRN: [id number] DOB/AGE: Aug 30, 1986 31 y.o.  Admit date: 11/02/2017 Discharge date: 11/05/2017  Primary Care Physician:  Massie Maroon, FNP   Discharge Diagnoses:   Patient Active Problem List   Diagnosis Date Noted  . Sickle cell anemia with crisis (HCC) 11/02/2017  . Sickle cell disease, type [place], with unspecified crisis (HCC) 02/18/2017    DISCHARGE MEDICATION: Allergies as of 11/05/2017      Reactions   Penicillins Swelling, Rash   Has patient had a PCN reaction causing immediate rash, facial/tongue/throat swelling, SOB or lightheadedness with hypotension: Yes Has patient had a PCN reaction causing severe rash involving mucus membranes or skin necrosis: Yes Has patient had a PCN reaction that required hospitalization No Has patient had a PCN reaction occurring within the last 10 years: no If all of the above answers are "NO", then may proceed with Cephalosporin use.      Medication List    TAKE these medications   ergocalciferol 50000 units capsule Commonly known as:  VITAMIN D2 Take 1 capsule (50,000 Units total) by mouth once a week.   folic acid 1 MG tablet Commonly known as:  FOLVITE Take 1 tablet (1 mg total) by mouth daily.   ibuprofen 800 MG tablet Commonly known as:  ADVIL,MOTRIN Take 1 tablet (800 mg total) by mouth every 8 (eight) hours as needed for moderate pain.   meclizine 25 MG tablet Commonly known as:  ANTIVERT Take 1 tablet (25 mg total) 3 (three) times daily as needed by mouth for dizziness.   multivitamin capsule Take 1 capsule daily by mouth.   naloxegol oxalate 12.5 MG Tabs tablet Commonly known as:  MOVANTIK Take 1 tablet (12.5 mg total) by mouth daily. What changed:    when to take this  reasons to take this   ondansetron 4 MG tablet Commonly known as:  ZOFRAN Take 1 tablet (4 mg total) by mouth every 8 (eight) hours as needed for nausea or vomiting. What changed:  Another medication with the same name was  removed. Continue taking this medication, and follow the directions you see here.   Oxycodone HCl 10 MG Tabs Take 1 tablet (10 mg total) every 6 (six) hours as needed by mouth.         Consults:    SIGNIFICANT DIAGNOSTIC STUDIES:  Dg Chest 2 View  Result Date: 11/02/2017 CLINICAL DATA:  Acute onset of shortness of breath and generalized chest discomfort. Sickle cell pain crisis. EXAM: CHEST  2 VIEW COMPARISON:  Chest radiograph and CTA of the chest performed 03/24/2017 FINDINGS: The lungs are well-aerated and clear. There is no evidence of focal opacification, pleural effusion or pneumothorax. The heart is normal in size; the mediastinal contour is within normal limits. No acute osseous abnormalities are seen. IMPRESSION: No acute cardiopulmonary process seen. Electronically Signed   By: Roanna Raider M.D.   On: 11/02/2017 05:02   Ct Angio Chest Pe W And/or Wo Contrast  Result Date: 11/02/2017 CLINICAL DATA:  Acute onset of generalized body ache, diarrhea and pleuritic chest pain. Sickle cell pain crisis. EXAM: CT ANGIOGRAPHY CHEST WITH CONTRAST TECHNIQUE: Multidetector CT imaging of the chest was performed using the standard protocol during bolus administration of intravenous contrast. Multiplanar CT image reconstructions and MIPs were obtained to evaluate the vascular anatomy. CONTRAST:  ISOVUE-370 IOPAMIDOL (ISOVUE-370) INJECTION 76% COMPARISON:  Chest radiograph performed earlier today at 4:39 a.m., and CTA of the chest performed 03/24/2017 FINDINGS: Cardiovascular:  There is no evidence of  pulmonary embolus. The heart is normal in size. The great vessels are unremarkable in appearance. Mediastinum/Nodes: The mediastinum is unremarkable. No mediastinal lymphadenopathy is seen. No pericardial effusion is identified. The visualized portions of the thyroid gland are unremarkable. No axillary lymphadenopathy is seen. Lungs/Pleura: Minimal bibasilar atelectasis is noted. No pleural  effusion or pneumothorax is seen. No masses are identified. Upper Abdomen: The visualized portions of the liver and spleen are unremarkable. The visualized portions of the pancreas, gallbladder, adrenal glands and kidneys are within normal limits. Musculoskeletal: No acute osseous abnormalities are identified. The visualized musculature is unremarkable in appearance. Review of the MIP images confirms the above findings. IMPRESSION: No evidence of pulmonary embolus. Minimal bibasilar atelectasis noted; lungs otherwise clear. Electronically Signed   By: Roanna RaiderJeffery  Chang M.D.   On: 11/02/2017 06:45       No results found for this or any previous visit (from the past 240 hour(s)).  BRIEF ADMITTING H & P: patient is a 31 year old female with  Known history of sickle cell [place] disease presenting with 1 day history of pain in her back and legs.  She was seen at [facility] emergency room where she received a total of 6 mg of Dilaudid but no relief. She is therefore being admitted with Sickle cell painful crisis. No NVD, no fever or chills.     Hospital Course:  Present on Admission:  This is opiate nave patient with hemoglobin [place] who is new to our inpatient service.  The patient presented with vaso-occlusive crisis and had an essentially uncomplicated hospitalization.  Her pain was managed with Dilaudid via the PCA, Toradol and IV fluids.  His pain improved transition was made to oral analgesics i.e. oxycodone 10 mg at the time of discharge the patient reported her pain is 2-3/10.   It was noted on admission of the patient was dehydrated.  She received fluid resuscitation at the time of discharge dehydration had resolved and the patient was adequately maintaining her hydration with oral intake.  This patient is also noted to have a mild leukocytosis without evidence of infection.  This is secondary to the inflammatory response in setting of a sickle cell crisis.  With regard to her sickle cell disease,  the patient equates pain management as equal to disease management.  In speaking with the patient is clear that she has knowledge deficit regarding her disease process.  However the patient demonstrates a readiness for learning and as such was given some information regarding the disease process as well as the complications that can arise as a result of the disease.  She has never been under the care of a hematologist but has been referred to hematologist.  The patient did not follow-up as she did not understand the significance of a hematologist role in the care of her disease.  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 Given the retinal complications this patient would benefit from hydroxyurea for disease management.  Currently she is open to seeing a hematologist and will speak with her primary provider about a referral.  Disposition and Follow-up: The patient is discharged home in good condition.  She will follow-up with her primary provider.  She has an appointment scheduled for January 02, 2018.  However I have advised the patient to speak with her primary provider with regard to referral for hematology even prior to that appointment.  She should follow-up as needed.  Discharge Instructions    Diet general  Complete by:  As directed    Increase activity slowly   Complete by:  As directed       DISCHARGE EXAM:  General: Alert, awake, oriented x x3 and in no apparent distress.   HEENT: [place]/AT PEERL, EOMI, anicteric Neck: Trachea midline, no masses, no thyromegal,y no JVD, no carotid bruit OROPHARYNX: Moist, No exudate/ erythema/lesions.  Heart: Regular rate and rhythm, without murmurs, rubs, gallops or S3. PMI non-displaced. Exam reveals no decreased pulses. Pulmonary/Chest: Normal effort. Breath sounds normal. No. Apnea. Clear to auscultation,no stridor,  no wheezing and no rhonchi noted. No respiratory distress and no tenderness  noted. Abdomen: Soft, nontender, nondistended, normal bowel sounds, no masses no hepatosplenomegaly noted. No fluid wave and no ascites. There is no guarding or rebound. Neuro: Alert and oriented to person, place and time. Normal motor skills, Displays no atrophy or tremors and exhibits normal muscle tone.  No focal neurological deficits noted cranial nerves II through XII grossly intact. No sensory deficit noted.  Strength at baseline in bilateral upper and lower extremities. Gait normal. Musculoskeletal: No warmth swelling or erythema around joints, no spinal tenderness noted. Psychiatric: Patient alert and oriented x3, good insight and cognition, good recent to remote recall. Lymph node survey: No cervical axillary or inguinal lymphadenopathy noted. Skin: Skin is warm and dry. No bruising, no ecchymosis and no rash noted. Pt is not diaphoretic. No erythema. No pallor    Blood pressure 124/79, pulse 76, temperature 97.6 F (36.4 C), temperature source Oral, resp. rate 16, height 5\' 3"  (1.6 m), weight 65 kg (143 lb 4.8 oz), last menstrual period 10/17/2017, SpO2 97 %.  Recent Labs    11/03/17 0408  NA 135  K 3.9  CL 103  CO2 26  GLUCOSE 107*  BUN 9  CREATININE 0.91  CALCIUM 8.8*   Recent Labs    11/03/17 0408  AST 35  ALT 24  ALKPHOS 52  BILITOT 0.9  PROT 7.0  ALBUMIN 3.9   No results for input(s): LIPASE, AMYLASE in the last 72 hours. Recent Labs    11/03/17 1033  WBC 11.6*  NEUTROABS 5.8  HGB 10.8*  HCT 29.3*  MCV 73.4*  PLT 286     Total time spent including face to face and decision making was greater than 30 minutes  Signed: Kale Dols A. 11/05/2017, 12:39 PM

## 2017-11-05 NOTE — Progress Notes (Signed)
Patient discharged to home, all discharge medications and instructions reviewed and questions answered.  Patient to be assisted to vehicle by wheelchair.  

## 2017-11-08 ENCOUNTER — Ambulatory Visit (INDEPENDENT_AMBULATORY_CARE_PROVIDER_SITE_OTHER): Payer: Self-pay | Admitting: Orthopaedic Surgery

## 2017-11-11 ENCOUNTER — Encounter (HOSPITAL_COMMUNITY): Payer: Self-pay | Admitting: Emergency Medicine

## 2017-11-11 ENCOUNTER — Other Ambulatory Visit: Payer: Self-pay

## 2017-11-11 ENCOUNTER — Emergency Department (HOSPITAL_COMMUNITY)
Admission: EM | Admit: 2017-11-11 | Discharge: 2017-11-11 | Disposition: A | Discharge status: Home / Self Care | Payer: Medicaid Other | Attending: Internal Medicine | Admitting: Internal Medicine

## 2017-11-11 ENCOUNTER — Emergency Department (HOSPITAL_COMMUNITY): Payer: Medicaid Other

## 2017-11-11 DIAGNOSIS — Z87891 Personal history of nicotine dependence: Secondary | ICD-10-CM | POA: Diagnosis not present

## 2017-11-11 DIAGNOSIS — D72829 Elevated white blood cell count, unspecified: Secondary | ICD-10-CM

## 2017-11-11 DIAGNOSIS — D57219 Sickle-cell/Hb-C disease with crisis, unspecified: Secondary | ICD-10-CM | POA: Diagnosis not present

## 2017-11-11 DIAGNOSIS — M545 Low back pain: Secondary | ICD-10-CM

## 2017-11-11 DIAGNOSIS — D5700 Hb-SS disease with crisis, unspecified: Secondary | ICD-10-CM | POA: Diagnosis not present

## 2017-11-11 DIAGNOSIS — Z79899 Other long term (current) drug therapy: Secondary | ICD-10-CM | POA: Insufficient documentation

## 2017-11-11 DIAGNOSIS — R52 Pain, unspecified: Secondary | ICD-10-CM

## 2017-11-11 LAB — URINALYSIS, ROUTINE W REFLEX MICROSCOPIC
Bilirubin Urine: NEGATIVE
Glucose, UA: NEGATIVE mg/dL
Hgb urine dipstick: NEGATIVE
Ketones, ur: NEGATIVE mg/dL
Leukocytes, UA: NEGATIVE
Nitrite: NEGATIVE
Protein, ur: NEGATIVE mg/dL
Specific Gravity, Urine: 1.011 (ref 1.005–1.030)
pH: 5 (ref 5.0–8.0)

## 2017-11-11 LAB — COMPREHENSIVE METABOLIC PANEL
ALT: 48 U/L (ref 14–54)
AST: 51 U/L — ABNORMAL HIGH (ref 15–41)
Albumin: 3.5 g/dL (ref 3.5–5.0)
Alkaline Phosphatase: 49 U/L (ref 38–126)
Anion gap: 6 (ref 5–15)
BUN: 7 mg/dL (ref 6–20)
CO2: 26 mmol/L (ref 22–32)
Calcium: 8.8 mg/dL — ABNORMAL LOW (ref 8.9–10.3)
Chloride: 105 mmol/L (ref 101–111)
Creatinine, Ser: 0.87 mg/dL (ref 0.44–1.00)
GFR calc Af Amer: >60 mL/min (ref >60)
GFR calc non Af Amer: >60 mL/min (ref >60)
Glucose, Bld: 101 mg/dL — ABNORMAL HIGH (ref 65–99)
Potassium: 4.5 mmol/L (ref 3.5–5.1)
Sodium: 137 mmol/L (ref 135–145)
Total Bilirubin: 0.9 mg/dL (ref 0.3–1.2)
Total Protein: 6.1 g/dL — ABNORMAL LOW (ref 6.5–8.1)

## 2017-11-11 LAB — CBC WITH DIFFERENTIAL/PLATELET
Basophils Absolute: 0.1 10*3/uL (ref 0.0–0.1)
Basophils Relative: 1 %
Eosinophils Absolute: 1.8 10*3/uL — ABNORMAL HIGH (ref 0.0–0.7)
Eosinophils Relative: 17 %
HCT: 30.4 % — ABNORMAL LOW (ref 36.0–46.0)
Hemoglobin: 11.2 g/dL — ABNORMAL LOW (ref 12.0–15.0)
Lymphocytes Relative: 42 %
Lymphs Abs: 4.7 10*3/uL — ABNORMAL HIGH (ref 0.7–4.0)
MCH: 27.1 pg (ref 26.0–34.0)
MCHC: 36.8 g/dL — ABNORMAL HIGH (ref 30.0–36.0)
MCV: 73.6 fL — ABNORMAL LOW (ref 78.0–100.0)
Monocytes Absolute: 0.8 10*3/uL (ref 0.1–1.0)
Monocytes Relative: 7 %
Neutro Abs: 3.7 10*3/uL (ref 1.7–7.7)
Neutrophils Relative %: 33 %
Platelets: 360 10*3/uL (ref 150–400)
RBC: 4.13 MIL/uL (ref 3.87–5.11)
RDW: 14.7 % (ref 11.5–15.5)
WBC: 11 10*3/uL — ABNORMAL HIGH (ref 4.0–10.5)

## 2017-11-11 LAB — PREGNANCY, URINE: Preg Test, Ur: NEGATIVE

## 2017-11-11 LAB — RETICULOCYTES
RBC.: 4.13 MIL/uL (ref 3.87–5.11)
Retic Count, Absolute: 268.5 10*3/uL — ABNORMAL HIGH (ref 19.0–186.0)
Retic Ct Pct: 6.5 % — ABNORMAL HIGH (ref 0.4–3.1)

## 2017-11-11 MED ORDER — HYDROMORPHONE HCL 1 MG/ML IJ SOLN
2.0000 mg | Freq: Once | INTRAMUSCULAR | Status: AC
  Administered 2017-11-11: 2 mg via INTRAVENOUS
  Filled 2017-11-11: qty 2

## 2017-11-11 MED ORDER — PANTOPRAZOLE SODIUM 40 MG PO TBEC: 40.0000 mg | DELAYED_RELEASE_TABLET | Freq: Every day | ORAL | 0 refills | Status: DC

## 2017-11-11 MED ORDER — HYDROXYUREA 500 MG PO CAPS: 500.0000 mg | ORAL_CAPSULE | Freq: Every day | ORAL | 1 refills | Status: DC

## 2017-11-11 MED ORDER — KETOROLAC TROMETHAMINE 15 MG/ML IJ SOLN
15.0000 mg | Freq: Once | INTRAMUSCULAR | Status: AC
  Administered 2017-11-11: 15 mg via INTRAVENOUS
  Filled 2017-11-11: qty 1

## 2017-11-11 MED ORDER — FOLIC ACID 1 MG PO TABS: 1.0000 mg | ORAL_TABLET | Freq: Every day | ORAL | 2 refills | Status: DC

## 2017-11-11 MED ORDER — SODIUM CHLORIDE 0.9 % IV SOLN
INTRAVENOUS | Status: DC
  Administered 2017-11-11: 11:00:00 via INTRAVENOUS

## 2017-11-11 MED ORDER — DICLOFENAC SODIUM 75 MG PO TBEC: 75.0000 mg | DELAYED_RELEASE_TABLET | Freq: Two times a day (BID) | ORAL | 0 refills | Status: DC

## 2017-11-11 MED ORDER — HYDROMORPHONE HCL 1 MG/ML IJ SOLN
2.0000 mg | INTRAMUSCULAR | Status: AC | PRN
  Administered 2017-11-11 (×3): 2 mg via INTRAVENOUS
  Filled 2017-11-11 (×4): qty 2

## 2017-11-11 MED ORDER — DIPHENHYDRAMINE HCL 50 MG/ML IJ SOLN
25.0000 mg | Freq: Once | INTRAMUSCULAR | Status: DC
  Filled 2017-11-11: qty 1

## 2017-11-11 NOTE — ED Triage Notes (Signed)
Pt c/o sickle cell crisis, with bilateral leg swelling, back pain. Was recently admitted at [facility] (transferred from here) for same. Discharged on Saturday.

## 2017-11-11 NOTE — Consult Note (Signed)
[facility] Medical Consultation  Hannah Hawkins AOZ:[id number] DOB: Mar 26, 1986 DOA: 11/11/2017 PCP: Massie Maroon, FNP   Requesting physician: Dr. Juleen China Date of consultation: 11/11/17 Reason for consultation: sickle cell pain crisis  Impression/Recommendations 1-sickle cell crisis: patient seen and examined. Reported improvement in her pain and was declining admission. -she doesn't want to bridge her pain contract and will arrange follow up with PCP on Monday or Tuesday -discharge on diclofenac -continue PRN ibuprofen -will provide GI prophylaxis with protonix -patient started on hydroxyurea 500mg  daily -IVF's and narcotics analgesics given while in ED -advise to maintain adequate hydration and to continue folic acid  2-lumbar pain -mild discomfort on palpations -denies injury that she can remember -x-ray done and demonstrated no acute abnormalities   3-vit D deficiency  -continue Vit D supplementation   4-mild leukocytosis -w/o source of infection and afebrile -most likely associated with stress demargination from crisis -repeat CBC at follow up visit to assess trend    After examining the patient, reviewing blood work done and x-ray, and discussing options of treatment with patient, she will like to go home with diclofenac prescription and follow up with her PCP. Patient discharge from ED.  Chief Complaint: pain crisis   HPI:  31 y/o female with PMH of sickle cell crisis; who presented with pain in her legs and mainly in her lumbar area. She reported to be similar to her pain crisis; but reported more pain in her lower back than usual. Patient expressed running out of pain meds and not having appointment until next week with her PCP. Patient denies CP, SOB, abd pain, dysuria, nausea, vomiting or any other complaints.  Of note, patient was recently discharge after been admitted to [facility] for pain crisis as well.  Review of Systems:  Neg except as otherwise  mentioned on HPI.  Past Medical History:  Diagnosis Date  . Sickle cell anemia (HCC)    Past Surgical History:  Procedure Laterality Date  . EYE SURGERY  03/09/2017   left eye   Social History:  reports that she has quit smoking. she has never used smokeless tobacco. She reports that she drinks alcohol. She reports that she does not use drugs.  Allergies  Allergen Reactions  . Penicillins Swelling and Rash    Has patient had a PCN reaction causing immediate rash, facial/tongue/throat swelling, SOB or lightheadedness with hypotension: Yes Has patient had a PCN reaction causing severe rash involving mucus membranes or skin necrosis: Yes Has patient had a PCN reaction that required hospitalization No Has patient had a PCN reaction occurring within the last 10 years: no If all of the above answers are "NO", then may proceed with Cephalosporin use.    No family history on file.  Prior to Admission medications   Medication Sig Start Date End Date Taking? Authorizing Provider  diphenhydrAMINE (BENADRYL) 25 MG tablet Take 25 mg by mouth every 6 (six) hours as needed for itching.   Yes [provider]  ergocalciferol (VITAMIN D2) 50000 units capsule Take 1 capsule (50,000 Units total) by mouth once a week. 02/22/17  Yes Massie Maroon, FNP  ibuprofen (ADVIL,MOTRIN) 800 MG tablet Take 1 tablet (800 mg total) by mouth every 8 (eight) hours as needed for moderate pain. 06/27/17  Yes Massie Maroon, FNP  meclizine (ANTIVERT) 25 MG tablet Take 1 tablet (25 mg total) 3 (three) times daily as needed by mouth for dizziness. 10/31/17  Yes Massie Maroon, FNP  Multiple Vitamin (MULTIVITAMIN) capsule Take  1 capsule daily by mouth.   Yes [provider]  naloxegol oxalate (MOVANTIK) 12.5 MG TABS tablet Take 1 tablet (12.5 mg total) by mouth daily. Patient taking differently: Take 12.5 mg by mouth daily as needed (pain).  08/04/17  Yes Massie MaroonHollis, [name], FNP  ondansetron (ZOFRAN) 4  MG tablet Take 1 tablet (4 mg total) by mouth every 8 (eight) hours as needed for nausea or vomiting. 09/30/17  Yes Massie MaroonHollis, [name], FNP  Oxycodone HCl 10 MG TABS Take 1 tablet (10 mg total) every 6 (six) hours as needed by mouth. Patient taking differently: Take 10 mg by mouth every 6 (six) hours as needed (pain).  10/28/17  Yes Massie MaroonHollis, [name], FNP  diclofenac (VOLTAREN) 75 MG EC tablet Take 1 tablet (75 mg total) by mouth 2 (two) times daily. 11/11/17   Vassie LollMadera, Kimiya Brunelle, MD  folic acid (FOLVITE) 1 MG tablet Take 1 tablet (1 mg total) by mouth daily. 11/11/17   Vassie LollMadera, Tavoris Brisk, MD  hydroxyurea (HYDREA) 500 MG capsule Take 1 capsule (500 mg total) by mouth daily. May take with food to minimize GI side effects. 11/11/17   Vassie LollMadera, Nasya Vincent, MD  pantoprazole (PROTONIX) 40 MG tablet Take 1 tablet (40 mg total) by mouth daily. 11/11/17 12/11/17  Vassie LollMadera, Macsen Nuttall, MD   Physical Exam: Blood pressure 108/67, pulse 73, temperature 97.8 F (36.6 C), temperature source Oral, resp. rate 16, height 5\' 3"  (1.6 m), weight 64.9 kg (143 lb), last menstrual period 10/17/2017, SpO2 100 %. Vitals:   11/11/17 1837 11/11/17 1900  BP:  108/67  Pulse: 75 73  Resp:    Temp:    SpO2: 100% 100%     General:  Afebrile, no CP, no SOB, no nausea, no vomiting. AAOX3 and in no distress.  Eyes: PERRL, no icterus, no nystagmus  ENT: no thrush, no exudates; moist MM.  Neck: no thyromegaly, no JVD  Cardiovascular: S1 and S2, no rubs, no gallops, no murmurs  Respiratory: good air movement, no wheezing, no crackles, good O2 sat on Room Air  Abdomen: soft, NT, ND, positive BS  Skin: multiple tattoos in her lumbar area, left arm, legs and chest; no erythema, no open wounds, no rash  Musculoskeletal: no cyanosis or clubbing; some pain in her ankles and especially to palpation of her lumbar area; no crepitus and overall with good range of motion.  Psychiatric: stable mood, good insight, able to follow  commands.  Neurologic: AAOX3, normal CN, no focal neurologic or motor deficit.  Labs on Admission:  Basic Metabolic Panel: Recent Labs  Lab 11/11/17 0944  NA 137  K 4.5  CL 105  CO2 26  GLUCOSE 101*  BUN 7  CREATININE 0.87  CALCIUM 8.8*   Liver Function Tests: Recent Labs  Lab 11/11/17 0944  AST 51*  ALT 48  ALKPHOS 49  BILITOT 0.9  PROT 6.1*  ALBUMIN 3.5   CBC: Recent Labs  Lab 11/11/17 0944  WBC 11.0*  NEUTROABS 3.7  HGB 11.2*  HCT 30.4*  MCV 73.6*  PLT 360    Radiological Exams on Admission: Dg Lumbar Spine 2-3 Views  Result Date: 11/11/2017 CLINICAL DATA:  31 year old female with lower back pain for 3-4 days. History of sickle cell anemia. EXAM: LUMBAR SPINE - 2-3 VIEW COMPARISON:  None. FINDINGS: There is no evidence of lumbar spine fracture. Alignment is normal. Intervertebral disc spaces are maintained. IMPRESSION: Negative. Electronically Signed   By: Sande BrothersSerena  Chacko M.D.   On: 11/11/2017 19:41  EKG: none   Time spent: 55 minutes  Vassie LollCarlos Joedy Eickhoff [facility] Pager [phone]  11/11/2017, 7:55 PM

## 2017-11-11 NOTE — ED Notes (Signed)
Pt verbalized understanding of d/c instructions and has no further questions. Pt is stable, A&Ox4, VSS.  

## 2017-11-11 NOTE — ED Provider Notes (Signed)
[facility]ENC[facility] Provider Note   CSN: [id number] Arrival date & time: 11/11/17  [id number]     History   Chief Complaint Chief Complaint  Patient presents with  . Sickle Cell Pain Crisis    HPI Jackolyn ConferBeatrice Pavia is a 31 y.o. female.  HPI   31yF with bilateral lower extremity and lower back pain.  She has a past history of sickle cell anemia.  She states that current symptoms are similar to her typical pain crisis symptoms.  Worsening over the last several days.  Minimal improvement with her home medications.  No fevers.  No rash.  No respiratory complaints.  No urinary complaints.   Past Medical History:  Diagnosis Date  . Sickle cell anemia [facility](HCC)     Patient Active Problem List   Diagnosis Date Noted  . Sickle cell anemia with crisis (HCC) 11/02/2017  . Sickle cell disease, type [place], with unspecified crisis (HCC) 02/18/2017    Past Surgical History:  Procedure Laterality Date  . EYE SURGERY  03/09/2017   left eye    OB History    No data available       Home Medications    Prior to Admission medications   Medication Sig Start Date End Date Taking? Authorizing Provider  ergocalciferol (VITAMIN D2) 50000 units capsule Take 1 capsule (50,000 Units total) by mouth once a week. 02/22/17   Massie MaroonHollis, [name], FNP  folic acid (FOLVITE) 1 MG tablet Take 1 tablet (1 mg total) by mouth daily. Patient not taking: Reported on 10/31/2017 08/04/17   Massie MaroonHollis, [name], FNP  ibuprofen (ADVIL,MOTRIN) 800 MG tablet Take 1 tablet (800 mg total) by mouth every 8 (eight) hours as needed for moderate pain. 06/27/17   Massie MaroonHollis, [name], FNP  meclizine (ANTIVERT) 25 MG tablet Take 1 tablet (25 mg total) 3 (three) times daily as needed by mouth for dizziness. 10/31/17   Massie MaroonHollis, [name], FNP  Multiple Vitamin (MULTIVITAMIN) capsule Take 1 capsule daily by mouth.    [provider]  naloxegol oxalate (MOVANTIK) 12.5 MG TABS tablet Take 1 tablet (12.5 mg  total) by mouth daily. Patient taking differently: Take 12.5 mg by mouth daily as needed (pain).  08/04/17   Massie MaroonHollis, [name], FNP  ondansetron (ZOFRAN) 4 MG tablet Take 1 tablet (4 mg total) by mouth every 8 (eight) hours as needed for nausea or vomiting. 09/30/17   Massie MaroonHollis, [name], FNP  Oxycodone HCl 10 MG TABS Take 1 tablet (10 mg total) every 6 (six) hours as needed by mouth. Patient not taking: Reported on 11/01/2017 10/28/17   Massie MaroonHollis, [name], FNP    Family History No family history on file.  Social History Social History   Tobacco Use  . Smoking status: Former Games developermoker  . Smokeless tobacco: Never Used  Substance Use Topics  . Alcohol use: Yes    Comment: Twice a month  . Drug use: No     Allergies   Penicillins   Review of Systems Review of Systems  All systems reviewed and negative, other than as noted in HPI.  Physical Exam Updated Vital Signs BP 108/76 (BP Location: Right Arm)   Pulse 76   Temp 97.8 F (36.6 C) (Oral)   Resp 16   Ht 5\' 3"  (1.6 m)   Wt 64.9 kg (143 lb)   LMP 10/17/2017 (Exact Date)   SpO2 98%   BMI 25.33 kg/m   Physical Exam  Constitutional: She appears well-developed and  well-nourished. No distress.  HENT:  Head: Normocephalic and atraumatic.  Eyes: Conjunctivae are normal. Right eye exhibits no discharge. Left eye exhibits no discharge.  Neck: Neck supple.  Cardiovascular: Normal rate, regular rhythm and normal heart sounds. Exam reveals no gallop and no friction rub.  No murmur heard. Pulmonary/Chest: Effort normal and breath sounds normal. No respiratory distress.  Abdominal: Soft. She exhibits no distension. There is no tenderness.  Musculoskeletal: She exhibits no edema or tenderness.  Lower extremities grossly normal in appearance and symmetric as compared to each other.  I cannot appreciate any swelling.  No rash  Neurological: She is alert.  Skin: Skin is warm and dry.  Psychiatric: She has a normal mood and affect. Her  behavior is normal. Thought content normal.  Nursing note and vitals reviewed.    ED Treatments / Results  Labs (all labs ordered are listed, but only abnormal results are displayed) Labs Reviewed  COMPREHENSIVE METABOLIC PANEL - Abnormal; Notable for the following components:      Result Value   Glucose, Bld 101 (*)    Calcium 8.8 (*)    Total Protein 6.1 (*)    AST 51 (*)    All other components within normal limits  CBC WITH DIFFERENTIAL/PLATELET - Abnormal; Notable for the following components:   WBC 11.0 (*)    Hemoglobin 11.2 (*)    HCT 30.4 (*)    MCV 73.6 (*)    MCHC 36.8 (*)    Lymphs Abs 4.7 (*)    Eosinophils Absolute 1.8 (*)    All other components within normal limits  RETICULOCYTES - Abnormal; Notable for the following components:   Retic Ct Pct 6.5 (*)    Retic Count, Absolute 268.5 (*)    All other components within normal limits  URINALYSIS, ROUTINE W REFLEX MICROSCOPIC    EKG  EKG Interpretation None       Radiology No results found.  Procedures Procedures (including critical care time)  Medications Ordered in ED Medications - No data to display   Initial Impression / Assessment and Plan / ED Course  I have reviewed the triage vital signs and the nursing notes.  Pertinent labs & imaging results that were available during my care of the patient were reviewed by me and considered in my medical decision making (see chart for details).    31yF with LE pain and lower back pain. Likely pain crisis. Afebrile. No respiratory complaints.   Received 2 mg dilaudid IV x4, 15mg  toradol and IVF. She reports no significant improvement of pain. Doubt emergent complication of SS or other emergent condition. Will admit for ongoing symptom management.   Final Clinical Impressions(s) / ED Diagnoses   Final diagnoses:  Sickle cell pain crisis [facility](HCC)    ED Discharge Orders    None       Raeford RazorKohut, Boleslaus Holloway, MD 11/11/17 1652

## 2017-11-11 NOTE — ED Notes (Signed)
Admitting physician at bedside

## 2017-11-13 ENCOUNTER — Emergency Department (HOSPITAL_BASED_OUTPATIENT_CLINIC_OR_DEPARTMENT_OTHER): Admit: 2017-11-13 | Discharge: 2017-11-13 | Disposition: A | Discharge status: Home / Self Care | Payer: Medicaid Other

## 2017-11-13 ENCOUNTER — Other Ambulatory Visit: Payer: Self-pay

## 2017-11-13 ENCOUNTER — Encounter (HOSPITAL_COMMUNITY): Payer: Self-pay | Admitting: Emergency Medicine

## 2017-11-13 ENCOUNTER — Inpatient Hospital Stay (HOSPITAL_COMMUNITY)
Admission: EM | Admit: 2017-11-13 | Discharge: 2017-11-17 | DRG: 812 | Disposition: A | Discharge status: Home / Self Care | Payer: Medicaid Other | Attending: Internal Medicine | Admitting: Internal Medicine

## 2017-11-13 DIAGNOSIS — M79609 Pain in unspecified limb: Secondary | ICD-10-CM

## 2017-11-13 DIAGNOSIS — Z791 Long term (current) use of non-steroidal anti-inflammatories (NSAID): Secondary | ICD-10-CM

## 2017-11-13 DIAGNOSIS — Z79891 Long term (current) use of opiate analgesic: Secondary | ICD-10-CM

## 2017-11-13 DIAGNOSIS — R2233 Localized swelling, mass and lump, upper limb, bilateral: Secondary | ICD-10-CM | POA: Diagnosis present

## 2017-11-13 DIAGNOSIS — Z87891 Personal history of nicotine dependence: Secondary | ICD-10-CM

## 2017-11-13 DIAGNOSIS — G894 Chronic pain syndrome: Secondary | ICD-10-CM | POA: Diagnosis present

## 2017-11-13 DIAGNOSIS — D57219 Sickle-cell/Hb-C disease with crisis, unspecified: Principal | ICD-10-CM | POA: Diagnosis present

## 2017-11-13 DIAGNOSIS — M79604 Pain in right leg: Secondary | ICD-10-CM | POA: Diagnosis present

## 2017-11-13 DIAGNOSIS — R05 Cough: Secondary | ICD-10-CM

## 2017-11-13 DIAGNOSIS — D5720 Sickle-cell/Hb-C disease without crisis: Secondary | ICD-10-CM

## 2017-11-13 DIAGNOSIS — R2243 Localized swelling, mass and lump, lower limb, bilateral: Secondary | ICD-10-CM | POA: Diagnosis present

## 2017-11-13 DIAGNOSIS — D5700 Hb-SS disease with crisis, unspecified: Secondary | ICD-10-CM | POA: Diagnosis present

## 2017-11-13 DIAGNOSIS — Z79899 Other long term (current) drug therapy: Secondary | ICD-10-CM

## 2017-11-13 DIAGNOSIS — J329 Chronic sinusitis, unspecified: Secondary | ICD-10-CM | POA: Diagnosis present

## 2017-11-13 DIAGNOSIS — R7989 Other specified abnormal findings of blood chemistry: Secondary | ICD-10-CM | POA: Diagnosis present

## 2017-11-13 DIAGNOSIS — Z880 Allergy status to penicillin: Secondary | ICD-10-CM

## 2017-11-13 DIAGNOSIS — D638 Anemia in other chronic diseases classified elsewhere: Secondary | ICD-10-CM | POA: Diagnosis present

## 2017-11-13 DIAGNOSIS — R059 Cough, unspecified: Secondary | ICD-10-CM

## 2017-11-13 LAB — PAIN MGMT, PROFILE 8 W/CONF, U
6 Acetylmorphine: NEGATIVE ng/mL (ref <10)
ALCOHOL METABOLITES: POSITIVE ng/mL — AB (ref <500)
Amphetamines: NEGATIVE ng/mL (ref <500)
BENZODIAZEPINES: NEGATIVE ng/mL (ref <100)
Benzoylecgonine: >75000 ng/mL — ABNORMAL HIGH (ref <100)
Buprenorphine, Urine: NEGATIVE ng/mL (ref <5)
Cocaine Metabolite: POSITIVE ng/mL — AB (ref <150)
Creatinine: 119.1 mg/dL
ETHYL SULFATE (ETS): 19504 ng/mL — AB (ref <100)
Ethyl Glucuronide (ETG): 133190 ng/mL — ABNORMAL HIGH (ref <500)
MARIJUANA METABOLITE: NEGATIVE ng/mL (ref <20)
MDMA: NEGATIVE ng/mL (ref <500)
OPIATES: NEGATIVE ng/mL (ref <100)
Oxidant: NEGATIVE ug/mL (ref <200)
Oxycodone: NEGATIVE ng/mL (ref <100)
pH: 6.68 (ref 4.5–9.0)

## 2017-11-13 LAB — CBC WITH DIFFERENTIAL/PLATELET
BASOS PCT: 1 %
Basophils Absolute: 0.1 10*3/uL (ref 0.0–0.1)
EOS PCT: 5 %
Eosinophils Absolute: 0.3 10*3/uL (ref 0.0–0.7)
HEMATOCRIT: 31.8 % — AB (ref 36.0–46.0)
HEMOGLOBIN: 11.4 g/dL — AB (ref 12.0–15.0)
LYMPHS PCT: 29 %
Lymphs Abs: 1.9 10*3/uL (ref 0.7–4.0)
MCH: 26.1 pg (ref 26.0–34.0)
MCHC: 35.8 g/dL (ref 30.0–36.0)
MCV: 72.8 fL — AB (ref 78.0–100.0)
MONOS PCT: 6 %
Monocytes Absolute: 0.4 10*3/uL (ref 0.1–1.0)
NEUTROS ABS: 3.7 10*3/uL (ref 1.7–7.7)
Neutrophils Relative %: 59 %
Platelets: 401 10*3/uL — ABNORMAL HIGH (ref 150–400)
RBC: 4.37 MIL/uL (ref 3.87–5.11)
RDW: 14.5 % (ref 11.5–15.5)
WBC: 6.4 10*3/uL (ref 4.0–10.5)

## 2017-11-13 LAB — COMPREHENSIVE METABOLIC PANEL
ALBUMIN: 3.5 g/dL (ref 3.5–5.0)
ALT: 46 U/L (ref 14–54)
ANION GAP: 8 (ref 5–15)
AST: 44 U/L — AB (ref 15–41)
Alkaline Phosphatase: 46 U/L (ref 38–126)
BUN: 6 mg/dL (ref 6–20)
CO2: 23 mmol/L (ref 22–32)
Calcium: 8.9 mg/dL (ref 8.9–10.3)
Chloride: 105 mmol/L (ref 101–111)
Creatinine, Ser: 0.82 mg/dL (ref 0.44–1.00)
GFR calc Af Amer: >60 mL/min (ref >60)
GFR calc non Af Amer: >60 mL/min (ref >60)
GLUCOSE: 142 mg/dL — AB (ref 65–99)
POTASSIUM: 3.8 mmol/L (ref 3.5–5.1)
SODIUM: 136 mmol/L (ref 135–145)
Total Bilirubin: 0.8 mg/dL (ref 0.3–1.2)
Total Protein: 6.5 g/dL (ref 6.5–8.1)

## 2017-11-13 LAB — I-STAT BETA HCG BLOOD, ED (MC, WL, AP ONLY): I-stat hCG, quantitative: <5 m[IU]/mL (ref <5)

## 2017-11-13 LAB — RETICULOCYTES
RBC.: 4.37 MIL/uL (ref 3.87–5.11)
RETIC CT PCT: 4.8 % — AB (ref 0.4–3.1)
Retic Count, Absolute: 209.8 10*3/uL — ABNORMAL HIGH (ref 19.0–186.0)

## 2017-11-13 MED ORDER — DIPHENHYDRAMINE HCL 25 MG PO CAPS
25.0000 mg | ORAL_CAPSULE | ORAL | Status: DC | PRN
  Administered 2017-11-13: 25 mg via ORAL
  Filled 2017-11-13: qty 1

## 2017-11-13 MED ORDER — HYDROMORPHONE HCL 1 MG/ML IJ SOLN: 2.0000 mg | INTRAMUSCULAR | Status: AC

## 2017-11-13 MED ORDER — HYDROMORPHONE HCL 1 MG/ML IJ SOLN
2.0000 mg | INTRAMUSCULAR | Status: AC
  Filled 2017-11-13: qty 2

## 2017-11-13 MED ORDER — HYDROMORPHONE HCL 1 MG/ML IJ SOLN
2.0000 mg | INTRAMUSCULAR | Status: AC
  Administered 2017-11-13: 2 mg via INTRAVENOUS

## 2017-11-13 MED ORDER — KETOROLAC TROMETHAMINE 30 MG/ML IJ SOLN
30.0000 mg | INTRAMUSCULAR | Status: AC
  Administered 2017-11-13: 30 mg via INTRAVENOUS
  Filled 2017-11-13: qty 1

## 2017-11-13 MED ORDER — SODIUM CHLORIDE 0.45 % IV SOLN
INTRAVENOUS | Status: DC
  Administered 2017-11-13: 20:00:00 via INTRAVENOUS

## 2017-11-13 MED ORDER — ONDANSETRON HCL 4 MG/2ML IJ SOLN
4.0000 mg | INTRAMUSCULAR | Status: DC | PRN
  Administered 2017-11-13: 4 mg via INTRAVENOUS
  Filled 2017-11-13: qty 2

## 2017-11-13 MED ORDER — HYDROMORPHONE HCL 1 MG/ML IJ SOLN
2.0000 mg | INTRAMUSCULAR | Status: AC
  Administered 2017-11-13: 2 mg via INTRAVENOUS
  Filled 2017-11-13: qty 2

## 2017-11-13 NOTE — ED Notes (Signed)
VASCULAR IS AWARE OF PATIENT

## 2017-11-13 NOTE — ED Notes (Signed)
Pt refused to take jacket off for vital signs.

## 2017-11-13 NOTE — ED Provider Notes (Signed)
[facility] EMERGENCY DEPARTMENT Provider Note   CSN: [id number] Arrival date & time: 11/13/17  1724     History   Chief Complaint Chief Complaint  Patient presents with  . Sickle Cell Pain Crisis    HPI Hannah Hawkins is a 31 y.o. female.  HPI Hannah Hawkins is a 31 y.o. female with history of sickle cell disease, type [place], presents to emergency department complaining of pain crisis.  She reports pain in the right leg.  She states pain is in posterior thigh and calf.  She reports pain has been there for several months but got worse in the last week.  She was seen in the emergency department 2 days ago for the same, patient was offered admission but she declined.  She states she was just in the hospital 2 weeks before that and did not want to be admitted.  She states she ran out of oxycodone, and was given prescription for NSAIDs which did not help.  She currently is seeing a pain specialist, does not have a hematologist.  She denies any nausea or vomiting.  No chest pain.  No fever or chills.  No urinary symptoms.  Denies any injuries to the leg.  Past Medical History:  Diagnosis Date  . Sickle cell anemia [facility])     Patient Active Problem List   Diagnosis Date Noted  . Sickle cell anemia with crisis (HCC) 11/02/2017  . Sickle cell disease, type [place], with unspecified crisis (HCC) 02/18/2017    Past Surgical History:  Procedure Laterality Date  . EYE SURGERY  03/09/2017   left eye    OB History    No data available       Home Medications    Prior to Admission medications   Medication Sig Start Date End Date Taking? Authorizing Provider  diclofenac (VOLTAREN) 75 MG EC tablet Take 1 tablet (75 mg total) by mouth 2 (two) times daily. 11/11/17   Lorre Nick, MD  diphenhydrAMINE (BENADRYL) 25 MG tablet Take 25 mg by mouth every 6 (six) hours as needed for itching.    [provider]  ergocalciferol (VITAMIN D2) 50000 units capsule Take 1  capsule (50,000 Units total) by mouth once a week. 02/22/17   Massie Maroon, FNP  folic acid (FOLVITE) 1 MG tablet Take 1 tablet (1 mg total) by mouth daily. 11/11/17   Vassie Loll, MD  hydroxyurea (HYDREA) 500 MG capsule Take 1 capsule (500 mg total) by mouth daily. May take with food to minimize GI side effects. 11/11/17   Lorre Nick, MD  ibuprofen (ADVIL,MOTRIN) 800 MG tablet Take 1 tablet (800 mg total) by mouth every 8 (eight) hours as needed for moderate pain. 06/27/17   Massie Maroon, FNP  meclizine (ANTIVERT) 25 MG tablet Take 1 tablet (25 mg total) 3 (three) times daily as needed by mouth for dizziness. 10/31/17   Massie Maroon, FNP  Multiple Vitamin (MULTIVITAMIN) capsule Take 1 capsule daily by mouth.    [provider]  naloxegol oxalate (MOVANTIK) 12.5 MG TABS tablet Take 1 tablet (12.5 mg total) by mouth daily. Patient taking differently: Take 12.5 mg by mouth daily as needed (pain).  08/04/17   Massie Maroon, FNP  ondansetron (ZOFRAN) 4 MG tablet Take 1 tablet (4 mg total) by mouth every 8 (eight) hours as needed for nausea or vomiting. 09/30/17   Massie Maroon, FNP  Oxycodone HCl 10 MG TABS Take 1 tablet (10 mg total) every 6 (  six) hours as needed by mouth. Patient taking differently: Take 10 mg by mouth every 6 (six) hours as needed (pain).  10/28/17   Massie MaroonHollis, [name], FNP  pantoprazole (PROTONIX) 40 MG tablet Take 1 tablet (40 mg total) by mouth daily. 11/11/17 12/11/17  Lorre NickAllen, Anthony, MD    Family History No family history on file.  Social History Social History   Tobacco Use  . Smoking status: Former Games developermoker  . Smokeless tobacco: Never Used  Substance Use Topics  . Alcohol use: Yes    Comment: Twice a month  . Drug use: No     Allergies   Penicillins   Review of Systems Review of Systems  Constitutional: Negative for chills and fever.  Respiratory: Negative for cough, chest tightness and shortness of breath.   Cardiovascular:  Negative for chest pain, palpitations and leg swelling.  Gastrointestinal: Negative for abdominal pain, diarrhea, nausea and vomiting.  Genitourinary: Negative for dysuria, flank pain and pelvic pain.  Musculoskeletal: Positive for arthralgias and myalgias. Negative for neck pain and neck stiffness.  Skin: Negative for rash.  Neurological: Negative for dizziness, weakness and headaches.  All other systems reviewed and are negative.    Physical Exam Updated Vital Signs BP 124/90 (BP Location: Right Arm)   Pulse 79   Temp (!) 97.4 F (36.3 C) (Oral)   Resp 14   Ht 5\' 3"  (1.6 m)   Wt 64.9 kg (143 lb)   LMP 10/17/2017 (Exact Date)   SpO2 97%   BMI 25.33 kg/m   Physical Exam  Constitutional: She is oriented to person, place, and time. She appears well-developed and well-nourished. No distress.  HENT:  Head: Normocephalic.  Eyes: Conjunctivae are normal.  Neck: Neck supple.  Cardiovascular: Normal rate, regular rhythm and normal heart sounds.  Pulmonary/Chest: Effort normal and breath sounds normal. No respiratory distress. She has no wheezes. She has no rales.  Abdominal: Soft. Bowel sounds are normal. She exhibits no distension. There is no tenderness. There is no rebound.  Musculoskeletal: She exhibits no edema.  Normal appearing right leg. DP pulses intact and equal bilaterally. TTP over right calf, posterior knee, posterior thigh. No swelling, skin discoloration, lesions. Pain with right ankle dorsiflexion.   Neurological: She is alert and oriented to person, place, and time.  Skin: Skin is warm and dry.  Psychiatric: She has a normal mood and affect. Her behavior is normal.  Nursing note and vitals reviewed.    ED Treatments / Results  Labs (all labs ordered are listed, but only abnormal results are displayed) Labs Reviewed  COMPREHENSIVE METABOLIC PANEL - Abnormal; Notable for the following components:      Result Value   Glucose, Bld 142 (*)    AST 44 (*)    All  other components within normal limits  CBC WITH DIFFERENTIAL/PLATELET - Abnormal; Notable for the following components:   Hemoglobin 11.4 (*)    HCT 31.8 (*)    MCV 72.8 (*)    Platelets 401 (*)    All other components within normal limits  RETICULOCYTES - Abnormal; Notable for the following components:   Retic Ct Pct 4.8 (*)    Retic Count, Absolute 209.8 (*)    All other components within normal limits  POC URINE PREG, ED    EKG  EKG Interpretation None       Radiology Dg Lumbar Spine 2-3 Views  Result Date: 11/11/2017 CLINICAL DATA:  31 year old female with lower back pain for 3-4 days. History  of sickle cell anemia. EXAM: LUMBAR SPINE - 2-3 VIEW COMPARISON:  None. FINDINGS: There is no evidence of lumbar spine fracture. Alignment is normal. Intervertebral disc spaces are maintained. IMPRESSION: Negative. Electronically Signed   By: Sande BrothersSerena  Chacko M.D.   On: 11/11/2017 19:41    Procedures Procedures (including critical care time)  Medications Ordered in ED Medications  0.45 % sodium chloride infusion (not administered)  ketorolac (TORADOL) 30 MG/ML injection 30 mg (not administered)  diphenhydrAMINE (BENADRYL) capsule 25-50 mg (not administered)  ondansetron (ZOFRAN) injection 4 mg (not administered)  HYDROmorphone (DILAUDID) injection 2 mg (not administered)    Or  HYDROmorphone (DILAUDID) injection 2 mg (not administered)  HYDROmorphone (DILAUDID) injection 2 mg (not administered)    Or  HYDROmorphone (DILAUDID) injection 2 mg (not administered)  HYDROmorphone (DILAUDID) injection 2 mg (not administered)    Or  HYDROmorphone (DILAUDID) injection 2 mg (not administered)  HYDROmorphone (DILAUDID) injection 2 mg (not administered)    Or  HYDROmorphone (DILAUDID) injection 2 mg (not administered)     Initial Impression / Assessment and Plan / ED Course  I have reviewed the triage vital signs and the nursing notes.  Pertinent labs & imaging results that were  available during my care of the patient were reviewed by me and considered in my medical decision making (see chart for details).     Patient with sickle cell crisis, pain in the right calf and right thigh.  Exam unremarkable other than tenderness to palpation over the calf and thigh area.  She is afebrile, nontoxic-appearing.  No respiratory distress.  Will order IV fluids, medications.  Labs obtained in triage, appears to be at baseline.  Will also get venous Doppler to rule out DVT in that leg.  Pt is in pain management. Ran out of oxycodone. Per records, pt filled 15 day supply just 10 days ago.    7:45 PM Patient is negative for DVT.  She has not received any medications yet.  We will continue to monitor and administer med and fluids.  8:29 PM Pt received one dose of pain meds. Signed out at shift change pending more medications and reassessment.    Final Clinical Impressions(s) / ED Diagnoses   Final diagnoses:  None    ED Discharge Orders    None       Jaynie CrumbleKirichenko, Zoey Gilkeson, PA-C 11/13/17 2030    Jaynie CrumbleKirichenko, Kenyata Napier, PA-C 11/13/17 2031    Melene PlanFloyd, Dan, DO 11/13/17 2310

## 2017-11-13 NOTE — ED Notes (Signed)
Pt weighs 64.9kg

## 2017-11-13 NOTE — ED Triage Notes (Signed)
Pt reports sickle cell crisis with pain to bilateral legs since last night.  States she was seen in ED 3 days ago for same and given pain medication that isn't relieving pain.

## 2017-11-13 NOTE — ED Provider Notes (Signed)
Sickle Cell Left leg pain Seen 2 days ago - wanted to go home Seen in Pain Mgmt - NO RX's  Plan: reassess after protocol meds  Frequent reassessments finds the patient with continuous pain that has not changed. Will admit.   [facility]Smith[facility] Discussed plan with the patient who is agreeable to transfer.   Elpidio AnisUpstill, Jim Philemon, PA-C 11/14/17 0020    Melene PlanFloyd, Dan, DO 11/17/17 [phone]

## 2017-11-13 NOTE — Progress Notes (Signed)
VASCULAR LAB PRELIMINARY  PRELIMINARY  PRELIMINARY  PRELIMINARY  Right lower extremity venous duplex completed.    Preliminary report:  There is no DVT or SVT noted in the right lower extremity.   Shakila Mak, RVT 11/13/2017, 7:47 PM

## 2017-11-14 ENCOUNTER — Emergency Department (HOSPITAL_COMMUNITY): Payer: Medicaid Other

## 2017-11-14 ENCOUNTER — Other Ambulatory Visit: Payer: Self-pay

## 2017-11-14 DIAGNOSIS — D5700 Hb-SS disease with crisis, unspecified: Secondary | ICD-10-CM | POA: Diagnosis present

## 2017-11-14 DIAGNOSIS — R229 Localized swelling, mass and lump, unspecified: Secondary | ICD-10-CM | POA: Diagnosis not present

## 2017-11-14 DIAGNOSIS — Z79891 Long term (current) use of opiate analgesic: Secondary | ICD-10-CM | POA: Diagnosis not present

## 2017-11-14 DIAGNOSIS — G894 Chronic pain syndrome: Secondary | ICD-10-CM | POA: Diagnosis present

## 2017-11-14 DIAGNOSIS — J320 Chronic maxillary sinusitis: Secondary | ICD-10-CM | POA: Diagnosis not present

## 2017-11-14 DIAGNOSIS — D638 Anemia in other chronic diseases classified elsewhere: Secondary | ICD-10-CM | POA: Diagnosis present

## 2017-11-14 DIAGNOSIS — R2243 Localized swelling, mass and lump, lower limb, bilateral: Secondary | ICD-10-CM | POA: Diagnosis present

## 2017-11-14 DIAGNOSIS — Z87891 Personal history of nicotine dependence: Secondary | ICD-10-CM | POA: Diagnosis not present

## 2017-11-14 DIAGNOSIS — D57219 Sickle-cell/Hb-C disease with crisis, unspecified: Secondary | ICD-10-CM | POA: Diagnosis present

## 2017-11-14 DIAGNOSIS — M79604 Pain in right leg: Secondary | ICD-10-CM | POA: Diagnosis present

## 2017-11-14 DIAGNOSIS — Z791 Long term (current) use of non-steroidal anti-inflammatories (NSAID): Secondary | ICD-10-CM | POA: Diagnosis not present

## 2017-11-14 DIAGNOSIS — R2233 Localized swelling, mass and lump, upper limb, bilateral: Secondary | ICD-10-CM | POA: Diagnosis present

## 2017-11-14 DIAGNOSIS — R05 Cough: Secondary | ICD-10-CM

## 2017-11-14 DIAGNOSIS — R7989 Other specified abnormal findings of blood chemistry: Secondary | ICD-10-CM | POA: Diagnosis present

## 2017-11-14 DIAGNOSIS — Z880 Allergy status to penicillin: Secondary | ICD-10-CM | POA: Diagnosis not present

## 2017-11-14 DIAGNOSIS — J329 Chronic sinusitis, unspecified: Secondary | ICD-10-CM | POA: Diagnosis present

## 2017-11-14 DIAGNOSIS — Z79899 Other long term (current) drug therapy: Secondary | ICD-10-CM | POA: Diagnosis not present

## 2017-11-14 MED ORDER — HYDROXYUREA 500 MG PO CAPS
500.0000 mg | ORAL_CAPSULE | Freq: Every day | ORAL | Status: DC
Start: 2017-11-14 — End: 2017-11-15
  Administered 2017-11-14 – 2017-11-15 (×2): 500 mg via ORAL
  Filled 2017-11-14 (×2): qty 1

## 2017-11-14 MED ORDER — HYDROMORPHONE HCL 1 MG/ML IJ SOLN
2.0000 mg | INTRAMUSCULAR | Status: AC
  Administered 2017-11-14: 2 mg via INTRAVENOUS
  Filled 2017-11-14: qty 2

## 2017-11-14 MED ORDER — PANTOPRAZOLE SODIUM 40 MG PO TBEC
40.0000 mg | DELAYED_RELEASE_TABLET | Freq: Every day | ORAL | Status: DC
Start: 2017-11-14 — End: 2017-11-17
  Administered 2017-11-14 – 2017-11-17 (×4): 40 mg via ORAL
  Filled 2017-11-14 (×4): qty 1

## 2017-11-14 MED ORDER — ONDANSETRON HCL 4 MG/2ML IJ SOLN
4.0000 mg | INTRAMUSCULAR | Status: DC | PRN
  Administered 2017-11-14: 4 mg via INTRAVENOUS

## 2017-11-14 MED ORDER — DIPHENHYDRAMINE HCL 25 MG PO CAPS
25.0000 mg | ORAL_CAPSULE | ORAL | Status: DC | PRN
  Administered 2017-11-14: 25 mg via ORAL
  Administered 2017-11-17 (×2): 50 mg via ORAL
  Filled 2017-11-14: qty 1
  Filled 2017-11-14 (×2): qty 2

## 2017-11-14 MED ORDER — HYDROMORPHONE 1 MG/ML IV SOLN
INTRAVENOUS | Status: DC
  Administered 2017-11-14: 5.7 mL via INTRAVENOUS
  Administered 2017-11-14: 11.1 mL via INTRAVENOUS
  Administered 2017-11-14: 04:00:00 via INTRAVENOUS
  Administered 2017-11-14: 0.9 mg via INTRAVENOUS
  Administered 2017-11-14: 2.7 mg via INTRAVENOUS
  Administered 2017-11-15: 1.5 mg via INTRAVENOUS
  Administered 2017-11-15: 1.8 mg via INTRAVENOUS
  Administered 2017-11-15: 3 mg via INTRAVENOUS
  Administered 2017-11-15: 2.7 mg via INTRAVENOUS

## 2017-11-14 MED ORDER — DIPHENHYDRAMINE HCL 50 MG/ML IJ SOLN: 12.5000 mg | Freq: Four times a day (QID) | INTRAMUSCULAR | Status: DC | PRN

## 2017-11-14 MED ORDER — SODIUM CHLORIDE 0.9 % IV SOLN: 25.0000 mg | INTRAVENOUS | Status: DC | PRN

## 2017-11-14 MED ORDER — ONDANSETRON HCL 4 MG/2ML IJ SOLN
4.0000 mg | Freq: Four times a day (QID) | INTRAMUSCULAR | Status: DC | PRN
  Filled 2017-11-14: qty 2

## 2017-11-14 MED ORDER — HYDROMORPHONE HCL 1 MG/ML IJ SOLN: 2.0000 mg | INTRAMUSCULAR | Status: AC

## 2017-11-14 MED ORDER — DIPHENHYDRAMINE HCL 12.5 MG/5ML PO ELIX: 12.5000 mg | ORAL_SOLUTION | Freq: Four times a day (QID) | ORAL | Status: DC | PRN

## 2017-11-14 MED ORDER — NALOXEGOL OXALATE 12.5 MG PO TABS
12.5000 mg | ORAL_TABLET | Freq: Every day | ORAL | Status: DC | PRN
  Filled 2017-11-14: qty 1

## 2017-11-14 MED ORDER — SODIUM CHLORIDE 0.45 % IV SOLN
INTRAVENOUS | Status: DC
  Administered 2017-11-14: 1000 mL via INTRAVENOUS
  Administered 2017-11-14 (×3): via INTRAVENOUS
  Administered 2017-11-15 – 2017-11-16 (×2): 1000 mL via INTRAVENOUS

## 2017-11-14 MED ORDER — SENNOSIDES-DOCUSATE SODIUM 8.6-50 MG PO TABS
1.0000 | ORAL_TABLET | Freq: Two times a day (BID) | ORAL | Status: DC
  Administered 2017-11-14 – 2017-11-17 (×7): 1 via ORAL
  Filled 2017-11-14 (×7): qty 1

## 2017-11-14 MED ORDER — ENOXAPARIN SODIUM 40 MG/0.4ML ~~LOC~~ SOLN
40.0000 mg | SUBCUTANEOUS | Status: DC
  Filled 2017-11-14 (×3): qty 0.4

## 2017-11-14 MED ORDER — NALOXONE HCL 0.4 MG/ML IJ SOLN: 0.4000 mg | INTRAMUSCULAR | Status: DC | PRN

## 2017-11-14 MED ORDER — POLYETHYLENE GLYCOL 3350 17 G PO PACK: 17.0000 g | PACK | Freq: Every day | ORAL | Status: DC | PRN

## 2017-11-14 MED ORDER — KETOROLAC TROMETHAMINE 30 MG/ML IJ SOLN
30.0000 mg | Freq: Four times a day (QID) | INTRAMUSCULAR | Status: DC
  Administered 2017-11-14 – 2017-11-17 (×14): 30 mg via INTRAVENOUS
  Filled 2017-11-14 (×13): qty 1

## 2017-11-14 MED ORDER — GUAIFENESIN ER 600 MG PO TB12
600.0000 mg | ORAL_TABLET | Freq: Two times a day (BID) | ORAL | Status: DC
Start: 2017-11-14 — End: 2017-11-14

## 2017-11-14 MED ORDER — SODIUM CHLORIDE 0.9% FLUSH: 9.0000 mL | INTRAVENOUS | Status: DC | PRN

## 2017-11-14 MED ORDER — ONDANSETRON HCL 4 MG PO TABS: 4.0000 mg | ORAL_TABLET | ORAL | Status: DC | PRN

## 2017-11-14 NOTE — H&P (Signed)
History and Physical    Hannah ConferBeatrice Hawkins ZOX:[id number] DOB: 27-Mar-1986 DOA: 11/13/2017  Referring MD/NP/PA: Elpidio AnisShari Upstill, PA-C PCP: Massie MaroonHollis, [name], FNP  Patient coming from: Home  Chief Complaint: Right leg pain  I have personally briefly reviewed patient's old medical records in [facility]   HPI: Hannah ConferBeatrice Hawkins is a 31 y.o. female with medical history significant of sickle cell disease ([place] Type); who presents with complaints of continued right leg pain over the last week.  Describes pain as sharp and stabbing pain that is located in her ankle or back of her calf, but can radiate to the base of her gluteal muscle.  She was last seen in the emergency department 2 days ago for the same, but was able to be discharged home patient notes running out of her normal medications of oxycodone, and she had been given NSAIDs which provided no relief from symptoms.  Associated symptoms include productive cough, mild nausea, intermittent lower extremity swelling, intermittent shortness of breath, and intermittent hot flashes(over the last 3 months).  Patient denies having any fever, dysuria, chest pain, abdominal pain, vomiting, or blood in stool /urine.  ED Course: Upon admission into the emergency department patient was seen to be afebrile with vital signs relatively within normal limits.  Labs revealed WBC 6.4, hemoglobin 11.4, glucose 142, and all other labs relatively unremarkable.  Doppler venous ultrasound of the right lower extremity was obtained, but reported did not see any acute signs of a lower extremity clot.  Patient was given multiple rounds of pain medication without relief of pain symptoms.  TRH called to admit.  Review of Systems  Constitutional: Positive for diaphoresis. Negative for fever and malaise/fatigue.       Positive for intermittent hot flashes  HENT: Negative for ear pain and nosebleeds.   Eyes: Negative for photophobia and pain.  Respiratory: Positive for cough,  sputum production and shortness of breath.   Cardiovascular: Positive for leg swelling. Negative for chest pain.  Gastrointestinal: Positive for nausea. Negative for abdominal pain, diarrhea and vomiting.  Genitourinary: Negative for dysuria and frequency.  Musculoskeletal: Positive for joint pain and myalgias. Negative for falls.  Skin: Negative for itching and rash.  Neurological: Negative for focal weakness, seizures and headaches.  Psychiatric/Behavioral: Negative for hallucinations and suicidal ideas.    Past Medical History:  Diagnosis Date  . Sickle cell anemia (HCC)     Past Surgical History:  Procedure Laterality Date  . EYE SURGERY  03/09/2017   left eye     reports that she has quit smoking. she has never used smokeless tobacco. She reports that she drinks alcohol. She reports that she does not use drugs.  Allergies  Allergen Reactions  . Penicillins Swelling and Rash    Has patient had a PCN reaction causing immediate rash, facial/tongue/throat swelling, SOB or lightheadedness with hypotension: Yes Has patient had a PCN reaction causing severe rash involving mucus membranes or skin necrosis: Yes Has patient had a PCN reaction that required hospitalization No Has patient had a PCN reaction occurring within the last 10 years: no If all of the above answers are "NO", then may proceed with Cephalosporin use.     Family history positive for sickle cell  Prior to Admission medications   Medication Sig Start Date End Date Taking? Authorizing Provider  diclofenac (VOLTAREN) 75 MG EC tablet Take 1 tablet (75 mg total) by mouth 2 (two) times daily. 11/11/17  Yes Lorre NickAllen, Anthony, MD  diphenhydrAMINE (BENADRYL) 25 MG  tablet Take 25 mg by mouth every 6 (six) hours as needed for itching.   Yes [provider]  ergocalciferol (VITAMIN D2) 50000 units capsule Take 1 capsule (50,000 Units total) by mouth once a week. Patient taking differently: Take 50,000 Units by mouth  every Wednesday.  02/22/17  Yes Massie MaroonHollis, [name], FNP  hydroxyurea (HYDREA) 500 MG capsule Take 1 capsule (500 mg total) by mouth daily. May take with food to minimize GI side effects. 11/11/17  Yes Lorre NickAllen, Anthony, MD  ibuprofen (ADVIL,MOTRIN) 800 MG tablet Take 1 tablet (800 mg total) by mouth every 8 (eight) hours as needed for moderate pain. 06/27/17  Yes Massie MaroonHollis, [name], FNP  meclizine (ANTIVERT) 25 MG tablet Take 1 tablet (25 mg total) 3 (three) times daily as needed by mouth for dizziness. 10/31/17  Yes Massie MaroonHollis, [name], FNP  Multiple Vitamin (MULTIVITAMIN WITH MINERALS) TABS tablet Take 1 tablet by mouth daily.   Yes [provider]  naloxegol oxalate (MOVANTIK) 12.5 MG TABS tablet Take 1 tablet (12.5 mg total) by mouth daily. Patient taking differently: Take 12.5 mg by mouth daily as needed (constipation).  08/04/17  Yes Massie MaroonHollis, [name], FNP  ondansetron (ZOFRAN) 4 MG tablet Take 1 tablet (4 mg total) by mouth every 8 (eight) hours as needed for nausea or vomiting. 09/30/17  Yes Massie MaroonHollis, [name], FNP  Oxycodone HCl 10 MG TABS Take 1 tablet (10 mg total) every 6 (six) hours as needed by mouth. Patient taking differently: Take 10 mg by mouth every 6 (six) hours as needed (pain).  10/28/17  Yes Massie MaroonHollis, [name], FNP  pantoprazole (PROTONIX) 40 MG tablet Take 1 tablet (40 mg total) by mouth daily. 11/11/17 12/11/17 Yes Lorre NickAllen, Anthony, MD  folic acid (FOLVITE) 1 MG tablet Take 1 tablet (1 mg total) by mouth daily. Patient not taking: Reported on 11/13/2017 11/11/17   Vassie LollMadera, Carlos, MD    Physical Exam:  Constitutional Young female in NAD, calm, comfortable Vitals:   11/13/17 2130 11/13/17 2215 11/13/17 2230 11/13/17 2345  BP: 114/82 114/84  121/69  Pulse: 68  73 81  Resp: 13 18 16 18   Temp:      TempSrc:      SpO2: 100%  100% 100%  Weight:      Height:       Eyes: PERRL, lids and conjunctivae normal ENMT: Mucous membranes are moist. Posterior pharynx clear of any exudate  or lesions.Normal dentition.  Neck: normal, supple, no masses, no thyromegaly Respiratory: clear to auscultation bilaterally, no wheezing, no crackles. Normal respiratory effort. No accessory muscle use.  Cardiovascular: Regular rate and rhythm, no murmurs / rubs / gallops. No extremity edema. 2+ pedal pulses. No carotid bruits.  Abdomen: no tenderness, no masses palpated. No hepatosplenomegaly. Bowel sounds positive.  Musculoskeletal: no clubbing / cyanosis. No joint deformity upper and lower extremities. Good ROM, no contractures. Normal muscle tone.  Skin: no rashes, lesions, ulcers. No induration Neurologic: CN 2-12 grossly intact. Sensation intact, DTR normal. Strength 5/5 in all 4.  Psychiatric: Normal judgment and insight. Alert and oriented x 3. Normal mood.     Labs on Admission: I have personally reviewed following labs and imaging studies  CBC: Recent Labs  Lab 11/11/17 0944 11/13/17 1741  WBC 11.0* 6.4  NEUTROABS 3.7 3.7  HGB 11.2* 11.4*  HCT 30.4* 31.8*  MCV 73.6* 72.8*  PLT 360 401*   Basic Metabolic Panel: Recent Labs  Lab 11/11/17 0944 11/13/17 1741  NA 137 136  K 4.5 3.8  CL 105 105  CO2 26 23  GLUCOSE 101* 142*  BUN 7 6  CREATININE 0.87 0.82  CALCIUM 8.8* 8.9   GFR: Estimated Creatinine Clearance: 88.4 mL/min (by C-G formula based on SCr of 0.82 mg/dL). Liver Function Tests: Recent Labs  Lab 11/11/17 0944 11/13/17 1741  AST 51* 44*  ALT 48 46  ALKPHOS 49 46  BILITOT 0.9 0.8  PROT 6.1* 6.5  ALBUMIN 3.5 3.5   No results for input(s): LIPASE, AMYLASE in the last 168 hours. No results for input(s): AMMONIA in the last 168 hours. Coagulation Profile: No results for input(s): INR, PROTIME in the last 168 hours. Cardiac Enzymes: No results for input(s): CKTOTAL, CKMB, CKMBINDEX, TROPONINI in the last 168 hours. BNP (last 3 results) No results for input(s): PROBNP in the last 8760 hours. HbA1C: No results for input(s): HGBA1C in the last 72  hours. CBG: No results for input(s): GLUCAP in the last 168 hours. Lipid Profile: No results for input(s): CHOL, HDL, LDLCALC, TRIG, CHOLHDL, LDLDIRECT in the last 72 hours. Thyroid Function Tests: No results for input(s): TSH, T4TOTAL, FREET4, T3FREE, THYROIDAB in the last 72 hours. Anemia Panel: Recent Labs    11/11/17 0944 11/13/17 1741  RETICCTPCT 6.5* 4.8*   Urine analysis:    Component Value Date/Time   COLORURINE YELLOW 11/11/2017 1509   APPEARANCEUR CLEAR 11/11/2017 1509   LABSPEC 1.011 11/11/2017 1509   PHURINE 5.0 11/11/2017 1509   GLUCOSEU NEGATIVE 11/11/2017 1509   HGBUR NEGATIVE 11/11/2017 1509   BILIRUBINUR NEGATIVE 11/11/2017 1509   KETONESUR NEGATIVE 11/11/2017 1509   PROTEINUR NEGATIVE 11/11/2017 1509   UROBILINOGEN 1.0 09/30/2017 1513   NITRITE NEGATIVE 11/11/2017 1509   LEUKOCYTESUR NEGATIVE 11/11/2017 1509   Sepsis Labs: No results found for this or any previous visit (from the past 240 hour(s)).   Radiological Exams on Admission: No results found.    Assessment/Plan Sickle cell anemia with pain crisis: Acute.  Patient presents with complaints of right leg pain similar to previous sickle cell crises.  Doppler ultrasound of the right lower extremity was noted to see no acute signs of clot, but official read not dictated at this time.  Hemoglobin appears to be stable at 11.4. - Ad[facility] - Sickle cell pain crisis order set initiated - PCA pump per protocol with Dilaudid - 0.45 % NS at 125 mL/h - Ketorolac 30 mg IV q 6h - Benadryl and antiemetics prn itching and nausea respectively - Continue hydroxyurea - Follow-up official read of right lower extremity Doppler ultrasound  Cough: She reports having a cough with these production. - Check chest x-ray  Thrombocytosis: Acute.  Count noted to be 407 on admission. - Follow-up repeat CBC in a.m.  DVT prophylaxis: lovenox Code Status: Full Family Communication: No  family present at bedside. Disposition Plan: Possible discharge home in 1-2 days Consults called: None  Admission status: Observation  Clydie Braun MD [facility] Pager [phone]   If 7PM-7AM, please contact night-coverage [email or web address] Password [facility]  11/14/2017, 12:39 AM

## 2017-11-14 NOTE — ED Notes (Signed)
Pt departed in NAD, in care of [facility].  

## 2017-11-15 ENCOUNTER — Telehealth: Payer: Self-pay

## 2017-11-15 DIAGNOSIS — R229 Localized swelling, mass and lump, unspecified: Secondary | ICD-10-CM

## 2017-11-15 DIAGNOSIS — J320 Chronic maxillary sinusitis: Secondary | ICD-10-CM

## 2017-11-15 DIAGNOSIS — D57219 Sickle-cell/Hb-C disease with crisis, unspecified: Principal | ICD-10-CM

## 2017-11-15 LAB — CBC
HCT: 31.4 % — ABNORMAL LOW (ref 36.0–46.0)
Hemoglobin: 11.6 g/dL — ABNORMAL LOW (ref 12.0–15.0)
MCH: 26.8 pg (ref 26.0–34.0)
MCHC: 36.9 g/dL — ABNORMAL HIGH (ref 30.0–36.0)
MCV: 72.5 fL — ABNORMAL LOW (ref 78.0–100.0)
Platelets: 350 10*3/uL (ref 150–400)
RBC: 4.33 MIL/uL (ref 3.87–5.11)
RDW: 14.6 % (ref 11.5–15.5)
WBC: 11.2 10*3/uL — ABNORMAL HIGH (ref 4.0–10.5)

## 2017-11-15 LAB — DIFFERENTIAL
BASOS ABS: 0.1 10*3/uL (ref 0.0–0.1)
Basophils Relative: 1 %
EOS ABS: 1.2 10*3/uL — AB (ref 0.0–0.7)
Eosinophils Relative: 11 %
LYMPHS ABS: 4.3 10*3/uL — AB (ref 0.7–4.0)
Lymphocytes Relative: 38 %
MONO ABS: 0.9 10*3/uL (ref 0.1–1.0)
Monocytes Relative: 8 %
NEUTROS ABS: 4.7 10*3/uL (ref 1.7–7.7)
NEUTROS PCT: 42 %
nRBC: 2 /100 WBC — ABNORMAL HIGH

## 2017-11-15 LAB — RETICULOCYTES
RBC.: 4.14 MIL/uL (ref 3.87–5.11)
RETIC COUNT ABSOLUTE: 211.1 10*3/uL — AB (ref 19.0–186.0)
RETIC CT PCT: 5.1 % — AB (ref 0.4–3.1)

## 2017-11-15 MED ORDER — NALOXONE HCL 0.4 MG/ML IJ SOLN: 0.4000 mg | INTRAMUSCULAR | Status: DC | PRN

## 2017-11-15 MED ORDER — ALUM & MAG HYDROXIDE-SIMETH 200-200-20 MG/5ML PO SUSP
15.0000 mL | Freq: Four times a day (QID) | ORAL | Status: DC | PRN
  Administered 2017-11-15: 15 mL via ORAL
  Filled 2017-11-15: qty 30

## 2017-11-15 MED ORDER — HYDROMORPHONE 1 MG/ML IV SOLN
INTRAVENOUS | Status: DC
  Administered 2017-11-15: 2.1 mg via INTRAVENOUS
  Administered 2017-11-15: 14:00:00 via INTRAVENOUS
  Administered 2017-11-15: 2 mg via INTRAVENOUS
  Administered 2017-11-15: 4.2 mg via INTRAVENOUS
  Administered 2017-11-16: 1.19 mg via INTRAVENOUS
  Administered 2017-11-16: 3.5 mg via INTRAVENOUS
  Administered 2017-11-16: 4.8 mg via INTRAVENOUS
  Administered 2017-11-16: 4 mg via INTRAVENOUS
  Filled 2017-11-15 (×2): qty 25

## 2017-11-15 MED ORDER — SALINE SPRAY 0.65 % NA SOLN
1.0000 | NASAL | Status: DC | PRN
  Filled 2017-11-15: qty 44

## 2017-11-15 MED ORDER — SODIUM CHLORIDE 0.9% FLUSH: 9.0000 mL | INTRAVENOUS | Status: DC | PRN

## 2017-11-15 MED ORDER — ONDANSETRON HCL 4 MG/2ML IJ SOLN
4.0000 mg | Freq: Four times a day (QID) | INTRAMUSCULAR | Status: DC | PRN
  Administered 2017-11-16 – 2017-11-17 (×4): 4 mg via INTRAVENOUS
  Filled 2017-11-15 (×4): qty 2

## 2017-11-15 MED ORDER — FLUTICASONE PROPIONATE 50 MCG/ACT NA SUSP
2.0000 | Freq: Every day | NASAL | Status: DC
  Administered 2017-11-15 – 2017-11-17 (×3): 2 via NASAL
  Filled 2017-11-15: qty 16

## 2017-11-15 MED ORDER — OXYCODONE HCL 5 MG PO TABS
15.0000 mg | ORAL_TABLET | ORAL | Status: DC
  Administered 2017-11-15 – 2017-11-17 (×11): 15 mg via ORAL
  Filled 2017-11-15 (×11): qty 3

## 2017-11-15 NOTE — Progress Notes (Signed)
Dr. Ashley RoyaltyMatthews notified that pt c/o nasal stuffiness and pt refusing to wear nasal cannula until nasal spray is available to use. Explained PCA policy to pt stressing the necessity of the monitoring system and how the med would have to be stopped if CO2 or O2 sats not monitored. Pt verbalized understanding and continually asked for nose spray. No distress noted with clear lung sounds. See new order per Dr. Ashley RoyaltyMatthews. Pt reassured.

## 2017-11-15 NOTE — Progress Notes (Signed)
SICKLE CELL SERVICE PROGRESS NOTE  Hannah ConferBeatrice Hawkins [id number] DOB: 02-06-1986 DOA: 11/13/2017 PCP: Massie MaroonHollis, [name], FNP  Assessment/Plan: Active Problems:   Sickle cell anemia with crisis (HCC)   Sickle cell pain crisis (HCC)  1. Hb  with Crisis: Will continue Dilaudid PCA and Toradol at current dose. Decrease IVF as patient eating and drinking well. Schedule Oxycodone.             2. Chronic Pain Syndrome: Pt admits that her pain has been almost daily and she has been having to take more than 10 mg of Oxycodone at a time to control pain.  3. Subcutaneous Nodules. Have been present for several months without enlargement. This could be a result of many chronic conditions. I have asked patient to follow up with her Primary Provider to further evaluate. A review of her lab data shows no increase in calcium so would not pursue an x-ray as an in- patient to evaluate for causes associated with hypercalcinosis. Additionally the characteristics of the nodules are not concerning for malignancy. Defer to evaluation in the ambulatory setting.  4. Anemia of Chronic Disease: Hb stable. Pt was started on Hydrea 500 mg in the ED. I will discontinue here in the hospital as the dose is sub-therapeutic and of no known benefit at a sub-therapeutic dose. Additionally the medication is not being followed by anyone and this places patient at risk for complications of bone marrow suppression over the long term without surveillance. 5. Leukocytosis: Associated with crisis. No evidence of infection.   Code Status: Full Code Family Communication: N/A Disposition Plan: Not yet ready for discharge  Jacquelina Hewins A.  Pager [phone]. If 7PM-7AM, please contact night-coverage.  11/15/2017, 10:47 AM  LOS: 1 day   Interim History: Pt reports that she is out of her pain medications and took her last Oxycodone tablet 5 days ago. Patient presented to the ED 3 days ago as she had no medications at home. She admits  that her current dose of Oxycodone 10 mg is not effective and she finds herself needing to take 20 mg at a time to control pain. So she ends up running out of it early.    Today she has been without the PCA as she was refusing to wear the monitoring device due to nasal congestion. Currently she is having pain in the RLE at an intensity of 10/10.   Pt also expressing frustration at having "stuffy nasal passages" and rhinorrhea which she states began last night. She denies sneezing or sinus pain.  She states that she is unable to breathe through nose and instead has to do mouth breathing. She requested Afrin and states that she has been taking it at home. I have advised patient that Afrin should not be taken on a continuous basis as it will result in vasospastic sinusitis.   Pt also c/o "nodules" on posterior surface of left arm which has been present for about 4 months as well as on the lateral surface of the right leg which have been present for an unknown amount of time.    Consultants:  None  Procedures:  None  Antibiotics:  None    Objective: Vitals:   11/15/17 0549 11/15/17 0825 11/15/17 0922 11/15/17 1000  BP: 130/90 119/85  128/70  Pulse: 72 72  61  Resp: 17 16 16 18   Temp: 97.7 F (36.5 C) 98.2 F (36.8 C)  97.6 F (36.4 C)  TempSrc: Axillary Axillary  Oral  SpO2: 100% 100%  100%  Weight:      Height:       Weight change:   Intake/Output Summary (Last 24 hours) at 11/15/2017 1047 Last data filed at 11/15/2017 1000 Gross per 24 hour  Intake 3480 ml  Output 3400 ml  Net 80 ml      Physical Exam General: Alert, awake, oriented x3, in no acute distress.  HEENT: [place]/AT PEERL, EOMI, anicteric. No sinus tenderness on palpation.  Neck: Trachea midline,  no masses, no thyromegal,y no JVD, no carotid bruit OROPHARYNX:  Moist, No exudate/ erythema/lesions.  Heart: Regular rate and rhythm, without murmurs, rubs, gallops, PMI non-displaced, no heaves or thrills on  palpation.  Lungs: Clear to auscultation, no wheezing or rhonchi noted. No increased vocal fremitus resonant to percussion  Abdomen: Soft, nontender, nondistended, positive bowel sounds, no masses no hepatosplenomegaly noted.  Neuro: No focal neurological deficits noted cranial nerves II through XII grossly intact.  Strength at baseline in bilateral upper and lower extremities. Pt observed ambulating without difficulty.  Musculoskeletal: No warmth swelling or erythema around joints, no spinal tenderness noted. Psychiatric: Patient alert and oriented x3, good insight and cognition, good recent to remote recall. Lymph node survey: No cervical axillary or inguinal lymphadenopathy noted. Skin: Subcutaneous nodules noted on arms and legs. All well defined and easily movable. No reticular pattern noted on skin.    Data Reviewed: Basic Metabolic Panel: Recent Labs  Lab 11/11/17 0944 11/13/17 1741  NA 137 136  K 4.5 3.8  CL 105 105  CO2 26 23  GLUCOSE 101* 142*  BUN 7 6  CREATININE 0.87 0.82  CALCIUM 8.8* 8.9   Liver Function Tests: Recent Labs  Lab 11/11/17 0944 11/13/17 1741  AST 51* 44*  ALT 48 46  ALKPHOS 49 46  BILITOT 0.9 0.8  PROT 6.1* 6.5  ALBUMIN 3.5 3.5   No results for input(s): LIPASE, AMYLASE in the last 168 hours. No results for input(s): AMMONIA in the last 168 hours. CBC: Recent Labs  Lab 11/11/17 0944 11/13/17 1741 11/15/17 0525  WBC 11.0* 6.4 11.2*  NEUTROABS 3.7 3.7  --   HGB 11.2* 11.4* 11.6*  HCT 30.4* 31.8* 31.4*  MCV 73.6* 72.8* 72.5*  PLT 360 401* 350   Cardiac Enzymes: No results for input(s): CKTOTAL, CKMB, CKMBINDEX, TROPONINI in the last 168 hours. BNP (last 3 results) No results for input(s): BNP in the last 8760 hours.  ProBNP (last 3 results) No results for input(s): PROBNP in the last 8760 hours.  CBG: No results for input(s): GLUCAP in the last 168 hours.  No results found for this or any previous visit (from the past 240  hour(s)).   Studies: Dg Chest 2 View  Result Date: 11/02/2017 CLINICAL DATA:  Acute onset of shortness of breath and generalized chest discomfort. Sickle cell pain crisis. EXAM: CHEST  2 VIEW COMPARISON:  Chest radiograph and CTA of the chest performed 03/24/2017 FINDINGS: The lungs are well-aerated and clear. There is no evidence of focal opacification, pleural effusion or pneumothorax. The heart is normal in size; the mediastinal contour is within normal limits. No acute osseous abnormalities are seen. IMPRESSION: No acute cardiopulmonary process seen. Electronically Signed   By: Roanna RaiderJeffery  Chang M.D.   On: 11/02/2017 05:02   Dg Lumbar Spine 2-3 Views  Result Date: 11/11/2017 CLINICAL DATA:  31 year old female with lower back pain for 3-4 days. History of sickle cell anemia. EXAM: LUMBAR SPINE - 2-3 VIEW COMPARISON:  None. FINDINGS: There is no evidence  of lumbar spine fracture. Alignment is normal. Intervertebral disc spaces are maintained. IMPRESSION: Negative. Electronically Signed   By: Sande Brothers M.D.   On: 11/11/2017 19:41   Ct Angio Chest Pe W And/or Wo Contrast  Result Date: 11/02/2017 CLINICAL DATA:  Acute onset of generalized body ache, diarrhea and pleuritic chest pain. Sickle cell pain crisis. EXAM: CT ANGIOGRAPHY CHEST WITH CONTRAST TECHNIQUE: Multidetector CT imaging of the chest was performed using the standard protocol during bolus administration of intravenous contrast. Multiplanar CT image reconstructions and MIPs were obtained to evaluate the vascular anatomy. CONTRAST:  ISOVUE-370 IOPAMIDOL (ISOVUE-370) INJECTION 76% COMPARISON:  Chest radiograph performed earlier today at 4:39 a.m., and CTA of the chest performed 03/24/2017 FINDINGS: Cardiovascular:  There is no evidence of pulmonary embolus. The heart is normal in size. The great vessels are unremarkable in appearance. Mediastinum/Nodes: The mediastinum is unremarkable. No mediastinal lymphadenopathy is seen. No  pericardial effusion is identified. The visualized portions of the thyroid gland are unremarkable. No axillary lymphadenopathy is seen. Lungs/Pleura: Minimal bibasilar atelectasis is noted. No pleural effusion or pneumothorax is seen. No masses are identified. Upper Abdomen: The visualized portions of the liver and spleen are unremarkable. The visualized portions of the pancreas, gallbladder, adrenal glands and kidneys are within normal limits. Musculoskeletal: No acute osseous abnormalities are identified. The visualized musculature is unremarkable in appearance. Review of the MIP images confirms the above findings. IMPRESSION: No evidence of pulmonary embolus. Minimal bibasilar atelectasis noted; lungs otherwise clear. Electronically Signed   By: Roanna Raider M.D.   On: 11/02/2017 06:45   Dg Chest Port 1 View  Result Date: 11/14/2017 CLINICAL DATA:  Sickle-cell crisis EXAM: PORTABLE CHEST 1 VIEW COMPARISON:  11/02/2017 FINDINGS: Stable mild cardiomegaly without acute pulmonary consolidation or edema. No effusion or pneumothorax. No acute osseous abnormality. IMPRESSION: Stable cardiomegaly with clear lungs.  No active pulmonary disease. Electronically Signed   By: Tollie Eth M.D.   On: 11/14/2017 00:58    Scheduled Meds: . enoxaparin (LOVENOX) injection  40 mg Subcutaneous Q24H  . fluticasone  2 spray Each Nare Daily  . HYDROmorphone   Intravenous Q4H  . hydroxyurea  500 mg Oral Daily  . ketorolac  30 mg Intravenous Q6H  . pantoprazole  40 mg Oral Daily  . senna-docusate  1 tablet Oral BID   Continuous Infusions: . sodium chloride 1,000 mL (11/15/17 0550)  . diphenhydrAMINE (BENADRYL) IVPB(SICKLE CELL ONLY)      Active Problems:   Sickle cell anemia with crisis (HCC)   Sickle cell pain crisis (HCC)     In excess of 40 minutes spent during this visit. Greater than 50% involved face to face contact with the patient for assessment, counseling and coordination of care.

## 2017-11-16 DIAGNOSIS — D638 Anemia in other chronic diseases classified elsewhere: Secondary | ICD-10-CM

## 2017-11-16 DIAGNOSIS — D5700 Hb-SS disease with crisis, unspecified: Secondary | ICD-10-CM

## 2017-11-16 MED ORDER — HYDROMORPHONE 1 MG/ML IV SOLN
INTRAVENOUS | Status: DC
  Administered 2017-11-16: 0.6 mg via INTRAVENOUS
  Administered 2017-11-16: 2.5 mg via INTRAVENOUS
  Administered 2017-11-16: 2.4 mg via INTRAVENOUS
  Administered 2017-11-17: 3.6 mg via INTRAVENOUS
  Administered 2017-11-17: 1.2 mg via INTRAVENOUS
  Administered 2017-11-17: 2 mg via INTRAVENOUS
  Administered 2017-11-17: 2.4 mg via INTRAVENOUS
  Filled 2017-11-16: qty 25

## 2017-11-16 MED ORDER — LIDOCAINE 5 % EX PTCH
1.0000 | MEDICATED_PATCH | CUTANEOUS | Status: DC
  Administered 2017-11-16 – 2017-11-17 (×2): 1 via TRANSDERMAL
  Filled 2017-11-16 (×2): qty 1

## 2017-11-16 NOTE — Progress Notes (Signed)
SATURATION QUALIFICATIONS: (This note is used to comply with regulatory documentation for home oxygen)  Patient Saturations on Room Air at Rest = 97  Patient Saturations on Room Air while Ambulating = 100  Patient Saturations on 0 Liters of oxygen while Ambulating = 100  Please briefly explain why patient needs home oxygen: Pt does not appear to need Oxygen therapy

## 2017-11-16 NOTE — Progress Notes (Signed)
SICKLE CELL SERVICE PROGRESS NOTE  Hannah Hawkins ZOX:[id number] DOB: August 03, 1986 DOA: 11/13/2017 PCP: Massie MaroonHollis, [name], FNP  Assessment/Plan: Active Problems:   Sickle cell anemia with crisis (HCC)   Sickle cell pain crisis (HCC)  1. Hb  with Crisis: Will wean Dilaudid PCA and continue Toradol at current dose. Will also add Lidoderm transdermal.  Continue scheduled Oxycodone.             2. Chronic Pain Syndrome: Pt admits that her pain has been almost daily and she has been having to take more than 10 mg of Oxycodone at a time to control pain.  3. Subcutaneous Nodules. Have been present for several months without enlargement. This could be a result of many chronic conditions. I have asked patient to follow up with her Primary Provider to further evaluate. A review of her lab data shows no increase in calcium so would not pursue an x-ray as an in- patient to evaluate for causes associated with hypercalcinosis. Additionally the characteristics of the nodules are not concerning for malignancy. Defer to evaluation in the ambulatory setting.  4. Anemia of Chronic Disease: Hb stable. Pt was started on Hydrea 500 mg in the ED. I will discontinue here in the hospital as the dose is sub-therapeutic and of no known benefit at a sub-therapeutic dose. Additionally the medication is not being followed by anyone and this places patient at risk for complications of bone marrow suppression over the long term without surveillance. 5. Leukocytosis: Associated with crisis. No evidence of infection.   Code Status: Full Code Family Communication: N/A Disposition Plan: Not yet ready for discharge  Hannah Hawkins A.  Pager [phone]. If 7PM-7AM, please contact night-coverage.  11/16/2017, 1:15 PM  LOS: 2 days   Interim History: Pt reports that she is out of her pain medications and took her last Oxycodone tablet 5 days ago. Patient presented to the ED 3 days ago as she had no medications at home. She  admits that her current dose of Oxycodone 10 mg is not effective and she finds herself needing to take 20 mg at a time to control pain. So she ends up running out of it early.    Today she reports pain intermittently in the popliteal area of the RLE. She has used 20.4 mg of Dilaudid on the PCA with 59/57:demands/deliveries in the last 24 hours. She is concerned that her medication even at home causes her to be too drowsy and she feels that she is unable to pursue her goals due to the drowsiness. Pt reports a BM last night  Consultants:  None  Procedures:  None   Antibiotics:  None    Objective: Vitals:   11/16/17 0800 11/16/17 1000 11/16/17 1100 11/16/17 1223  BP:  (!) 129/94 117/77   Pulse:  61 60   Resp: 16 18 18 12   Temp:      TempSrc:      SpO2: 97% 97% 100% 100%  Weight:      Height:       Weight change:   Intake/Output Summary (Last 24 hours) at 11/16/2017 1315 Last data filed at 11/16/2017 0800 Gross per 24 hour  Intake 1255.17 ml  Output 2350 ml  Net -1094.83 ml      Physical Exam General: Alert, awake, oriented x3, in no acute distress. Appears sleepy. HEENT: [place]/AT PEERL, EOMI, anicteric. No sinus tenderness on palpation.  Neck: Trachea midline,  no masses, no thyromegal,y no JVD, no carotid bruit OROPHARYNX:  Moist, No  exudate/ erythema/lesions.  Heart: Regular rate and rhythm, without murmurs, rubs, gallops, PMI non-displaced, no heaves or thrills on palpation.  Lungs: Clear to auscultation, no wheezing or rhonchi noted. No increased vocal fremitus resonant to percussion  Abdomen: Soft, nontender, nondistended, positive bowel sounds, no masses no hepatosplenomegaly noted.  Neuro: No focal neurological deficits noted cranial nerves II through XII grossly intact.  Strength at baseline in bilateral upper and lower extremities. Pt observed ambulating without difficulty.  Musculoskeletal: No warmth swelling or erythema around joints, no spinal tenderness  noted. Psychiatric: Patient alert and oriented x3, good insight and cognition, good recent to remote recall. Skin: Subcutaneous nodules noted on arms and legs. All well defined and easily movable. No reticular pattern noted on skin.    Data Reviewed: Basic Metabolic Panel: Recent Labs  Lab 11/11/17 0944 11/13/17 1741  NA 137 136  K 4.5 3.8  CL 105 105  CO2 26 23  GLUCOSE 101* 142*  BUN 7 6  CREATININE 0.87 0.82  CALCIUM 8.8* 8.9   Liver Function Tests: Recent Labs  Lab 11/11/17 0944 11/13/17 1741  AST 51* 44*  ALT 48 46  ALKPHOS 49 46  BILITOT 0.9 0.8  PROT 6.1* 6.5  ALBUMIN 3.5 3.5   No results for input(s): LIPASE, AMYLASE in the last 168 hours. No results for input(s): AMMONIA in the last 168 hours. CBC: Recent Labs  Lab 11/11/17 0944 11/13/17 1741 11/15/17 0525  WBC 11.0* 6.4 11.2*  NEUTROABS 3.7 3.7 4.7  HGB 11.2* 11.4* 11.6*  HCT 30.4* 31.8* 31.4*  MCV 73.6* 72.8* 72.5*  PLT 360 401* 350   Cardiac Enzymes: No results for input(s): CKTOTAL, CKMB, CKMBINDEX, TROPONINI in the last 168 hours. BNP (last 3 results) No results for input(s): BNP in the last 8760 hours.  ProBNP (last 3 results) No results for input(s): PROBNP in the last 8760 hours.  CBG: No results for input(s): GLUCAP in the last 168 hours.  No results found for this or any previous visit (from the past 240 hour(s)).   Studies: Dg Chest 2 View  Result Date: 11/02/2017 CLINICAL DATA:  Acute onset of shortness of breath and generalized chest discomfort. Sickle cell pain crisis. EXAM: CHEST  2 VIEW COMPARISON:  Chest radiograph and CTA of the chest performed 03/24/2017 FINDINGS: The lungs are well-aerated and clear. There is no evidence of focal opacification, pleural effusion or pneumothorax. The heart is normal in size; the mediastinal contour is within normal limits. No acute osseous abnormalities are seen. IMPRESSION: No acute cardiopulmonary process seen. Electronically Signed   By:  Roanna RaiderJeffery  Chang Hawkins.D.   On: 11/02/2017 05:02   Dg Lumbar Spine 2-3 Views  Result Date: 11/11/2017 CLINICAL DATA:  31 year old female with lower back pain for 3-4 days. History of sickle cell anemia. EXAM: LUMBAR SPINE - 2-3 VIEW COMPARISON:  None. FINDINGS: There is no evidence of lumbar spine fracture. Alignment is normal. Intervertebral disc spaces are maintained. IMPRESSION: Negative. Electronically Signed   By: Sande BrothersSerena  Chacko Hawkins.D.   On: 11/11/2017 19:41   Ct Angio Chest Pe W And/or Wo Contrast  Result Date: 11/02/2017 CLINICAL DATA:  Acute onset of generalized body ache, diarrhea and pleuritic chest pain. Sickle cell pain crisis. EXAM: CT ANGIOGRAPHY CHEST WITH CONTRAST TECHNIQUE: Multidetector CT imaging of the chest was performed using the standard protocol during bolus administration of intravenous contrast. Multiplanar CT image reconstructions and MIPs were obtained to evaluate the vascular anatomy. CONTRAST:  100mL ISOVUE-370 IOPAMIDOL (ISOVUE-370) INJECTION 76%  COMPARISON:  Chest radiograph performed earlier today at 4:39 a.Hawkins., and CTA of the chest performed 03/24/2017 FINDINGS: Cardiovascular:  There is no evidence of pulmonary embolus. The heart is normal in size. The great vessels are unremarkable in appearance. Mediastinum/Nodes: The mediastinum is unremarkable. No mediastinal lymphadenopathy is seen. No pericardial effusion is identified. The visualized portions of the thyroid gland are unremarkable. No axillary lymphadenopathy is seen. Lungs/Pleura: Minimal bibasilar atelectasis is noted. No pleural effusion or pneumothorax is seen. No masses are identified. Upper Abdomen: The visualized portions of the liver and spleen are unremarkable. The visualized portions of the pancreas, gallbladder, adrenal glands and kidneys are within normal limits. Musculoskeletal: No acute osseous abnormalities are identified. The visualized musculature is unremarkable in appearance. Review of the MIP images  confirms the above findings. IMPRESSION: No evidence of pulmonary embolus. Minimal bibasilar atelectasis noted; lungs otherwise clear. Electronically Signed   By: Roanna Raider Hawkins.D.   On: 11/02/2017 06:45   Dg Chest Port 1 View  Result Date: 11/14/2017 CLINICAL DATA:  Sickle-cell crisis EXAM: PORTABLE CHEST 1 VIEW COMPARISON:  11/02/2017 FINDINGS: Stable mild cardiomegaly without acute pulmonary consolidation or edema. No effusion or pneumothorax. No acute osseous abnormality. IMPRESSION: Stable cardiomegaly with clear lungs.  No active pulmonary disease. Electronically Signed   By: Tollie Eth Hawkins.D.   On: 11/14/2017 00:58    Scheduled Meds: . enoxaparin (LOVENOX) injection  40 mg Subcutaneous Q24H  . fluticasone  2 spray Each Nare Daily  . HYDROmorphone   Intravenous Q4H  . ketorolac  30 mg Intravenous Q6H  . lidocaine  1 patch Transdermal Q24H  . oxyCODONE  15 mg Oral Q4H  . pantoprazole  40 mg Oral Daily  . senna-docusate  1 tablet Oral BID   Continuous Infusions: . sodium chloride 900 mL (11/16/17 0549)  . diphenhydrAMINE (BENADRYL) IVPB(SICKLE CELL ONLY)      Active Problems:   Sickle cell anemia with crisis (HCC)   Sickle cell pain crisis (HCC)     In excess of 25 minutes spent during this visit. Greater than 50% involved face to face contact with the patient for assessment, counseling and coordination of care.

## 2017-11-16 NOTE — Progress Notes (Signed)
Pt is complaining of right calf pain that "moves up and down her leg" I checked circumference for swelling and there is no obvious swelling indicating a blood clot. I suggested to patient that she wear her SCD's and maybe that would help, patient had no objections so I ordered her a pair.

## 2017-11-17 ENCOUNTER — Other Ambulatory Visit: Payer: Self-pay | Admitting: Family Medicine

## 2017-11-17 DIAGNOSIS — D571 Sickle-cell disease without crisis: Secondary | ICD-10-CM

## 2017-11-17 DIAGNOSIS — Z79891 Long term (current) use of opiate analgesic: Secondary | ICD-10-CM

## 2017-11-17 DIAGNOSIS — J329 Chronic sinusitis, unspecified: Secondary | ICD-10-CM

## 2017-11-17 LAB — CBC WITH DIFFERENTIAL/PLATELET
BASOS ABS: 0.1 10*3/uL (ref 0.0–0.1)
Basophils Relative: 1 %
EOS ABS: 1.4 10*3/uL — AB (ref 0.0–0.7)
Eosinophils Relative: 15 %
HCT: 30.1 % — ABNORMAL LOW (ref 36.0–46.0)
Hemoglobin: 11 g/dL — ABNORMAL LOW (ref 12.0–15.0)
LYMPHS ABS: 3.5 10*3/uL (ref 0.7–4.0)
Lymphocytes Relative: 37 %
MCH: 26.6 pg (ref 26.0–34.0)
MCHC: 36.5 g/dL — AB (ref 30.0–36.0)
MCV: 72.9 fL — ABNORMAL LOW (ref 78.0–100.0)
MONO ABS: 0.8 10*3/uL (ref 0.1–1.0)
Monocytes Relative: 9 %
NEUTROS PCT: 38 %
Neutro Abs: 3.6 10*3/uL (ref 1.7–7.7)
PLATELETS: 352 10*3/uL (ref 150–400)
RBC: 4.13 MIL/uL (ref 3.87–5.11)
RDW: 14.7 % (ref 11.5–15.5)
WBC: 9.4 10*3/uL (ref 4.0–10.5)
nRBC: 2 /100 WBC — ABNORMAL HIGH

## 2017-11-17 LAB — BASIC METABOLIC PANEL
Anion gap: 5 (ref 5–15)
BUN: 8 mg/dL (ref 6–20)
CALCIUM: 9.2 mg/dL (ref 8.9–10.3)
CHLORIDE: 102 mmol/L (ref 101–111)
CO2: 28 mmol/L (ref 22–32)
CREATININE: 0.66 mg/dL (ref 0.44–1.00)
Glucose, Bld: 115 mg/dL — ABNORMAL HIGH (ref 65–99)
Potassium: 3.7 mmol/L (ref 3.5–5.1)
SODIUM: 135 mmol/L (ref 135–145)

## 2017-11-17 MED ORDER — FLUTICASONE PROPIONATE 50 MCG/ACT NA SUSP: 2.0000 | Freq: Every day | NASAL | 2 refills | Status: DC

## 2017-11-17 MED ORDER — OXYCODONE HCL 10 MG PO TABS: 10.0000 mg | ORAL_TABLET | Freq: Four times a day (QID) | ORAL | 0 refills | Status: DC | PRN

## 2017-11-17 NOTE — Progress Notes (Signed)
Second ICU RN attempted to place new IV and was successful. Continue to monitor.

## 2017-11-17 NOTE — Progress Notes (Signed)
Pt was discharged home today. Instructions were reviewed with patient, and prescription was given to patient. Pt was taken to [facility] entrance via wheelchair by NT.

## 2017-11-17 NOTE — Progress Notes (Signed)
Patient requests bed elevated. Educated patient regarding safety by bed staying in the lowest position. Patient insisted on bed being elevated. Continue to educate patient.

## 2017-11-17 NOTE — Progress Notes (Signed)
Reviewed [place] Substance Reporting system prior to prescribing opiate medications. No inconsistencies noted. Reviewed pain management profile, illicit drug use noted. Results have been forwarded to medical director for further review. Patient has been admitted to inpatient services for a sickle cell crisis on 11/13/2017.   Meds ordered this encounter  Medications  . Oxycodone HCl 10 MG TABS    Sig: Take 1 tablet (10 mg total) by mouth every 6 (six) hours as needed.    Dispense:  60 tablet    Refill:  0    Order Specific Question:   Supervising Provider    Answer:   [name], [name] [[id number]]     Nolon NationsLaChina Moore Hollis  MSN, FNP-C Patient Care [facility] [address]  [place], [place] [address] [phone]

## 2017-11-17 NOTE — Progress Notes (Signed)
Loss of IV access. IV RN and ICU RN attempted to place new IV  But unsuccessful. Paged on-call provider.

## 2017-11-17 NOTE — Discharge Summary (Signed)
Hannah Hawkins MRN: [id number] DOB/AGE: April 03, 1986 31 y.o.  Admit date: 11/13/2017 Discharge date: 11/17/2017  Primary Care Physician:  Massie Maroon, FNP   Discharge Diagnoses:   Patient Active Problem List   Diagnosis Date Noted  . Anemia of chronic disease 11/13/2017  . Sinusitis, chronic 11/13/2017  . Sickle-cell/Hb-C disease (HCC) 02/18/2017    DISCHARGE MEDICATION: Allergies as of 11/17/2017      Reactions   Penicillins Swelling, Rash   Has patient had a PCN reaction causing immediate rash, facial/tongue/throat swelling, SOB or lightheadedness with hypotension: Yes Has patient had a PCN reaction causing severe rash involving mucus membranes or skin necrosis: Yes Has patient had a PCN reaction that required hospitalization No Has patient had a PCN reaction occurring within the last 10 years: no If all of the above answers are "NO", then may proceed with Cephalosporin use.      Medication List    STOP taking these medications   hydroxyurea 500 MG capsule Commonly known as:  HYDREA     TAKE these medications   diclofenac 75 MG EC tablet Commonly known as:  VOLTAREN Take 1 tablet (75 mg total) by mouth 2 (two) times daily.   diphenhydrAMINE 25 MG tablet Commonly known as:  BENADRYL Take 25 mg by mouth every 6 (six) hours as needed for itching.   ergocalciferol 50000 units capsule Commonly known as:  VITAMIN D2 Take 1 capsule (50,000 Units total) by mouth once a week. What changed:  when to take this   fluticasone 50 MCG/ACT nasal spray Commonly known as:  FLONASE Place 2 sprays into both nostrils daily.   folic acid 1 MG tablet Commonly known as:  FOLVITE Take 1 tablet (1 mg total) by mouth daily.   ibuprofen 800 MG tablet Commonly known as:  ADVIL,MOTRIN Take 1 tablet (800 mg total) by mouth every 8 (eight) hours as needed for moderate pain.   meclizine 25 MG tablet Commonly known as:  ANTIVERT Take 1 tablet (25 mg total) 3 (three) times daily as  needed by mouth for dizziness.   multivitamin with minerals Tabs tablet Take 1 tablet by mouth daily.   naloxegol oxalate 12.5 MG Tabs tablet Commonly known as:  MOVANTIK Take 1 tablet (12.5 mg total) by mouth daily. What changed:    when to take this  reasons to take this   ondansetron 4 MG tablet Commonly known as:  ZOFRAN Take 1 tablet (4 mg total) by mouth every 8 (eight) hours as needed for nausea or vomiting.   Oxycodone HCl 10 MG Tabs Take 1 tablet (10 mg total) by mouth every 6 (six) hours as needed. What changed:  reasons to take this   pantoprazole 40 MG tablet Commonly known as:  PROTONIX Take 1 tablet (40 mg total) by mouth daily.         Consults:    SIGNIFICANT DIAGNOSTIC STUDIES:  Dg Chest 2 View  Result Date: 11/02/2017 CLINICAL DATA:  Acute onset of shortness of breath and generalized chest discomfort. Sickle cell pain crisis. EXAM: CHEST  2 VIEW COMPARISON:  Chest radiograph and CTA of the chest performed 03/24/2017 FINDINGS: The lungs are well-aerated and clear. There is no evidence of focal opacification, pleural effusion or pneumothorax. The heart is normal in size; the mediastinal contour is within normal limits. No acute osseous abnormalities are seen. IMPRESSION: No acute cardiopulmonary process seen. Electronically Signed   By: Roanna Raider M.D.   On: 11/02/2017 05:02   Dg Lumbar Spine  2-3 Views  Result Date: 11/11/2017 CLINICAL DATA:  31 year old female with lower back pain for 3-4 days. History of sickle cell anemia. EXAM: LUMBAR SPINE - 2-3 VIEW COMPARISON:  None. FINDINGS: There is no evidence of lumbar spine fracture. Alignment is normal. Intervertebral disc spaces are maintained. IMPRESSION: Negative. Electronically Signed   By: Sande BrothersSerena  Chacko M.D.   On: 11/11/2017 19:41   Ct Angio Chest Pe W And/or Wo Contrast  Result Date: 11/02/2017 CLINICAL DATA:  Acute onset of generalized body ache, diarrhea and pleuritic chest pain. Sickle cell  pain crisis. EXAM: CT ANGIOGRAPHY CHEST WITH CONTRAST TECHNIQUE: Multidetector CT imaging of the chest was performed using the standard protocol during bolus administration of intravenous contrast. Multiplanar CT image reconstructions and MIPs were obtained to evaluate the vascular anatomy. CONTRAST:  100mL ISOVUE-370 IOPAMIDOL (ISOVUE-370) INJECTION 76% COMPARISON:  Chest radiograph performed earlier today at 4:39 a.m., and CTA of the chest performed 03/24/2017 FINDINGS: Cardiovascular:  There is no evidence of pulmonary embolus. The heart is normal in size. The great vessels are unremarkable in appearance. Mediastinum/Nodes: The mediastinum is unremarkable. No mediastinal lymphadenopathy is seen. No pericardial effusion is identified. The visualized portions of the thyroid gland are unremarkable. No axillary lymphadenopathy is seen. Lungs/Pleura: Minimal bibasilar atelectasis is noted. No pleural effusion or pneumothorax is seen. No masses are identified. Upper Abdomen: The visualized portions of the liver and spleen are unremarkable. The visualized portions of the pancreas, gallbladder, adrenal glands and kidneys are within normal limits. Musculoskeletal: No acute osseous abnormalities are identified. The visualized musculature is unremarkable in appearance. Review of the MIP images confirms the above findings. IMPRESSION: No evidence of pulmonary embolus. Minimal bibasilar atelectasis noted; lungs otherwise clear. Electronically Signed   By: Roanna RaiderJeffery  Chang M.D.   On: 11/02/2017 06:45   Dg Chest Port 1 View  Result Date: 11/14/2017 CLINICAL DATA:  Sickle-cell crisis EXAM: PORTABLE CHEST 1 VIEW COMPARISON:  11/02/2017 FINDINGS: Stable mild cardiomegaly without acute pulmonary consolidation or edema. No effusion or pneumothorax. No acute osseous abnormality. IMPRESSION: Stable cardiomegaly with clear lungs.  No active pulmonary disease. Electronically Signed   By: Tollie Ethavid  Kwon M.D.   On: 11/14/2017 00:58       No results found for this or any previous visit (from the past 240 hour(s)).  BRIEF ADMITTING H & P: Hannah Hawkins is a 31 y.o. female with medical history significant of sickle cell disease ([place] Type); who presents with complaints of continued right leg pain over the last week.  Describes pain as sharp and stabbing pain that is located in her ankle or back of her calf, but can radiate to the base of her gluteal muscle.  She was last seen in the emergency department 2 days ago for the same, but was able to be discharged home patient notes running out of her normal medications of oxycodone, and she had been given NSAIDs which provided no relief from symptoms.  Associated symptoms include productive cough, mild nausea, intermittent lower extremity swelling, intermittent shortness of breath, and intermittent hot flashes(over the last 3 months).  Patient denies having any fever, dysuria, chest pain, abdominal pain, vomiting, or blood in stool /urine.  ED Course: Upon admission into the emergency department patient was seen to be afebrile with vital signs relatively within normal limits.  Labs revealed WBC 6.4, hemoglobin 11.4, glucose 142, and all other labs relatively unremarkable.  Doppler venous ultrasound of the right lower extremity was obtained, but reported did not see any acute signs  of a lower extremity clot.  Patient was given multiple rounds of pain medication without relief of pain symptoms.  TRH called to admit.     Hospital Course:  Present on Admission: . (Resolved) Sickle cell anemia with crisis (HCC) . (Resolved) Sickle cell pain crisis (HCC) . Anemia of chronic disease . Sinusitis, chronic  This is an opiate tolerant patient with hemoglobin Cullen who was admitted with sickle cell crisis.  Her pain was managed initially with IV Dilaudid via the PCA, Toradol and IV fluids.  The patient also receive topical treatment with a Lidoderm transdermal patch which she felt gave her  significant relief.  As the pain improved the Dilaudid PCA was weaned and transition was made to oxycodone orally.  Patient was discharged home on her prehospital regimen of analgesics.  I feel that this patient would benefit greatly from disease management with hydroxyurea.  It appears that on a previous ER visit the patient was started on hydroxyurea urea 500 mg on a daily basis.  She has been adherent to taking the hydroxyurea however as there was no plans for surveillance of the medications and the medication was administered at a subtherapeutic dose and thus it was discontinued here in the hospital.  I would recommend referral to hematology for further management of her disease and to consider initiation of hydroxyurea therapy.  The patient also brought to my attention that she is been having subcutaneous nodules which have been present for several months without enlargement.  I performed a cursory evaluation would include a calcium showed that she had a calcium levels within normal limits.  I felt that the characteristics of the nodules were not concerning for malignancy however this patient [facility]  I have asked the patient to further pursue this issue with her primary provider at the patient care center.  Patient has an anemia of chronic disease however her hemoglobin was stable during hospitalization and there was no requirement for transfusion.  Patient has a chronic sinusitis as she uses Afrin on a daily basis.  I have had a discussion with the patient and asked her to wean the Afrin by using it only on every other day basis and then increasing intervals between its use by 1 day/week until she has had no use for 1 week and then stop it.    Disposition and Follow-up: Patient is discharged home in stable condition she is to follow-up with her primary provider for ongoing medical needs.   DISCHARGE EXAM:  General: Alert, awake, oriented x3, in mild  distress.  HEENT: [place]/AT PEERL, EOMI, anicteric Neck: Trachea midline, no masses, no thyromegal,y no JVD, no carotid bruit OROPHARYNX: Moist, No exudate/ erythema/lesions.  Heart: Regular rate and rhythm, without murmurs, rubs, gallops or S3. PMI non-displaced. Exam reveals no decreased pulses. Pulmonary/Chest: Normal effort. Breath sounds normal. No. Apnea. Clear to auscultation,no stridor,  no wheezing and no rhonchi noted. No respiratory distress and no tenderness noted. Abdomen: Soft, nontender, nondistended, normal bowel sounds, no masses no hepatosplenomegaly noted. No fluid wave and no ascites. There is no guarding or rebound. Neuro: Alert and oriented to person, place and time. Normal motor skills, Displays no atrophy or tremors and exhibits normal muscle tone.  No focal neurological deficits noted cranial nerves II through XII grossly intact. No sensory deficit noted.  Strength at baseline in bilateral upper and lower extremities. Gait normal. Musculoskeletal: No warmth swelling or erythema around joints, no spinal tenderness noted. Psychiatric:  Patient alert and oriented x3, good insight and cognition, good recent to remote recall. Skin: Skin is warm and dry. No bruising, no ecchymosis and no rash noted. Pt is not diaphoretic. No erythema. No pallor    Blood pressure 119/83, pulse 70, temperature 98.3 F (36.8 C), temperature source Oral, resp. rate 12, height 5\' 3"  (1.6 m), weight 64.9 kg (143 lb), last menstrual period 11/13/2017, SpO2 100 %.  Recent Labs    11/17/17 0933  NA 135  K 3.7  CL 102  CO2 28  GLUCOSE 115*  BUN 8  CREATININE 0.66  CALCIUM 9.2   No results for input(s): AST, ALT, ALKPHOS, BILITOT, PROT, ALBUMIN in the last 72 hours. No results for input(s): LIPASE, AMYLASE in the last 72 hours. Recent Labs    11/15/17 0525 11/17/17 0933  WBC 11.2* 9.4  NEUTROABS 4.7 3.6  HGB 11.6* 11.0*  HCT 31.4* 30.1*  MCV 72.5* 72.9*  PLT 350 352     Total time  spent including face to face and decision making was greater than 30 minutes  Signed: Deondria Puryear A. 11/17/2017, 2:08 PM

## 2017-12-01 ENCOUNTER — Other Ambulatory Visit: Payer: Self-pay | Admitting: Family Medicine

## 2017-12-01 ENCOUNTER — Telehealth: Payer: Self-pay | Admitting: Family Medicine

## 2017-12-01 DIAGNOSIS — D571 Sickle-cell disease without crisis: Secondary | ICD-10-CM

## 2017-12-01 DIAGNOSIS — Z79891 Long term (current) use of opiate analgesic: Secondary | ICD-10-CM

## 2017-12-01 MED ORDER — OXYCODONE HCL 10 MG PO TABS: 10.0000 mg | ORAL_TABLET | Freq: Four times a day (QID) | ORAL | 0 refills | Status: DC | PRN

## 2017-12-01 NOTE — Progress Notes (Signed)
Reviewed [place] Substance Reporting system (PMP Aware)  prior to prescribing opiate medications. No inconsistencies noted. However, urine tox screen positive for illicit substances. Will no longer prescribe opiate medications. Will send a referral to pain management and given a final prescription for the following medication:   Meds ordered this encounter  Medications  . Oxycodone HCl 10 MG TABS    Sig: Take 1 tablet (10 mg total) by mouth every 6 (six) hours as needed.    Dispense:  60 tablet    Refill:  0    Order Specific Question:   Supervising Provider    Answer:   [name], [name] [[id number]]    Nolon NationsLachina Moore Keirstin Musil  MSN, FNP-C Patient Care [facility] [address]  [place], [place] [id number] [phone]

## 2017-12-01 NOTE — Telephone Encounter (Signed)
Reviewed [place] Substance Reporting system (PMP Aware)  prior to prescribing opiate medications. No inconsistencies noted. However, urine tox screen positive for illicit substances. Will no longer prescribe opiate medications. Will send a referral to pain management and given a final prescription for the following medication:   Meds ordered this encounter  Medications  . Oxycodone HCl 10 MG TABS    Sig: Take 1 tablet (10 mg total) by mouth every 6 (six) hours as needed.    Dispense:  60 tablet    Refill:  0    Order Specific Question:   Supervising Provider    Answer:   [name], [name] [id number]]   Discussed plan with patient, discussed understanding.     Hannah NationsLachina Moore Eddis Pingleton  MSN, FNP-C Patient Care [facility] [address] Hannah Hawkins  [place], [place] [address] [phone]

## 2017-12-02 ENCOUNTER — Encounter: Payer: Self-pay | Admitting: Family Medicine

## 2017-12-10 ENCOUNTER — Other Ambulatory Visit: Payer: Self-pay

## 2017-12-10 ENCOUNTER — Emergency Department (HOSPITAL_COMMUNITY)
Admission: EM | Admit: 2017-12-10 | Discharge: 2017-12-10 | Disposition: A | Discharge status: Home / Self Care | Payer: Medicaid Other | Attending: Emergency Medicine | Admitting: Emergency Medicine

## 2017-12-10 ENCOUNTER — Encounter (HOSPITAL_COMMUNITY): Payer: Self-pay | Admitting: *Deleted

## 2017-12-10 DIAGNOSIS — Z87891 Personal history of nicotine dependence: Secondary | ICD-10-CM | POA: Insufficient documentation

## 2017-12-10 DIAGNOSIS — M5431 Sciatica, right side: Secondary | ICD-10-CM

## 2017-12-10 DIAGNOSIS — Z79891 Long term (current) use of opiate analgesic: Secondary | ICD-10-CM

## 2017-12-10 DIAGNOSIS — D5700 Hb-SS disease with crisis, unspecified: Secondary | ICD-10-CM

## 2017-12-10 DIAGNOSIS — Z79899 Other long term (current) drug therapy: Secondary | ICD-10-CM | POA: Diagnosis not present

## 2017-12-10 DIAGNOSIS — D571 Sickle-cell disease without crisis: Secondary | ICD-10-CM

## 2017-12-10 LAB — CBC WITH DIFFERENTIAL/PLATELET
Basophils Absolute: 0 10*3/uL (ref 0.0–0.1)
Basophils Relative: 0 %
EOS PCT: 7 %
Eosinophils Absolute: 0.7 10*3/uL (ref 0.0–0.7)
HEMATOCRIT: 34.9 % — AB (ref 36.0–46.0)
Hemoglobin: 12.5 g/dL (ref 12.0–15.0)
LYMPHS ABS: 2.3 10*3/uL (ref 0.7–4.0)
LYMPHS PCT: 24 %
MCH: 26.9 pg (ref 26.0–34.0)
MCHC: 35.8 g/dL (ref 30.0–36.0)
MCV: 75.1 fL — AB (ref 78.0–100.0)
MONO ABS: 0.6 10*3/uL (ref 0.1–1.0)
MONOS PCT: 7 %
NEUTROS ABS: 5.8 10*3/uL (ref 1.7–7.7)
Neutrophils Relative %: 62 %
PLATELETS: 345 10*3/uL (ref 150–400)
RBC: 4.65 MIL/uL (ref 3.87–5.11)
RDW: 17.2 % — AB (ref 11.5–15.5)
WBC: 9.5 10*3/uL (ref 4.0–10.5)

## 2017-12-10 LAB — COMPREHENSIVE METABOLIC PANEL
ALBUMIN: 4.3 g/dL (ref 3.5–5.0)
ALT: 57 U/L — ABNORMAL HIGH (ref 14–54)
ANION GAP: 8 (ref 5–15)
AST: 55 U/L — AB (ref 15–41)
Alkaline Phosphatase: 56 U/L (ref 38–126)
BILIRUBIN TOTAL: 0.9 mg/dL (ref 0.3–1.2)
BUN: 10 mg/dL (ref 6–20)
CO2: 25 mmol/L (ref 22–32)
Calcium: 9.5 mg/dL (ref 8.9–10.3)
Chloride: 104 mmol/L (ref 101–111)
Creatinine, Ser: 0.75 mg/dL (ref 0.44–1.00)
GFR calc Af Amer: >60 mL/min (ref >60)
GFR calc non Af Amer: >60 mL/min (ref >60)
GLUCOSE: 115 mg/dL — AB (ref 65–99)
POTASSIUM: 4.3 mmol/L (ref 3.5–5.1)
SODIUM: 137 mmol/L (ref 135–145)
TOTAL PROTEIN: 7.9 g/dL (ref 6.5–8.1)

## 2017-12-10 LAB — RETICULOCYTES
RBC.: 4.65 MIL/uL (ref 3.87–5.11)
Retic Count, Absolute: 320.9 10*3/uL — ABNORMAL HIGH (ref 19.0–186.0)
Retic Ct Pct: 6.9 % — ABNORMAL HIGH (ref 0.4–3.1)

## 2017-12-10 LAB — I-STAT BETA HCG BLOOD, ED (MC, WL, AP ONLY): I-stat hCG, quantitative: <5 m[IU]/mL (ref <5)

## 2017-12-10 MED ORDER — SODIUM CHLORIDE 0.45 % IV SOLN
INTRAVENOUS | Status: DC
  Administered 2017-12-10: 03:00:00 via INTRAVENOUS

## 2017-12-10 MED ORDER — HYDROMORPHONE HCL 1 MG/ML IJ SOLN: 2.0000 mg | INTRAMUSCULAR | Status: AC

## 2017-12-10 MED ORDER — HYDROMORPHONE HCL 1 MG/ML IJ SOLN
2.0000 mg | INTRAMUSCULAR | Status: AC
  Administered 2017-12-10: 2 mg via INTRAVENOUS
  Filled 2017-12-10: qty 2

## 2017-12-10 MED ORDER — HYDROMORPHONE HCL 1 MG/ML IJ SOLN
2.0000 mg | Freq: Once | INTRAMUSCULAR | Status: AC
  Administered 2017-12-10: 2 mg via INTRAVENOUS

## 2017-12-10 MED ORDER — KETOROLAC TROMETHAMINE 30 MG/ML IJ SOLN
30.0000 mg | INTRAMUSCULAR | Status: AC
  Administered 2017-12-10: 30 mg via INTRAVENOUS
  Filled 2017-12-10: qty 1

## 2017-12-10 MED ORDER — OXYCODONE HCL 10 MG PO TABS: 10.0000 mg | ORAL_TABLET | Freq: Four times a day (QID) | ORAL | 0 refills | Status: DC | PRN

## 2017-12-10 MED ORDER — CYCLOBENZAPRINE HCL 10 MG PO TABS: 10.0000 mg | ORAL_TABLET | Freq: Three times a day (TID) | ORAL | 0 refills | Status: DC | PRN

## 2017-12-10 MED ORDER — HYDROMORPHONE HCL 1 MG/ML IJ SOLN
2.0000 mg | INTRAMUSCULAR | Status: AC
  Filled 2017-12-10: qty 2

## 2017-12-10 MED ORDER — PREDNISONE 20 MG PO TABS: 40.0000 mg | ORAL_TABLET | Freq: Every day | ORAL | 0 refills | Status: DC

## 2017-12-10 MED ORDER — ONDANSETRON HCL 4 MG/2ML IJ SOLN
4.0000 mg | Freq: Once | INTRAMUSCULAR | Status: AC
  Administered 2017-12-10: 4 mg via INTRAVENOUS
  Filled 2017-12-10: qty 2

## 2017-12-10 MED ORDER — DIPHENHYDRAMINE HCL 50 MG/ML IJ SOLN
25.0000 mg | Freq: Once | INTRAMUSCULAR | Status: AC
  Administered 2017-12-10: 25 mg via INTRAVENOUS
  Filled 2017-12-10: qty 1

## 2017-12-10 MED ORDER — PROMETHAZINE HCL 25 MG PO TABS: 25.0000 mg | ORAL_TABLET | ORAL | Status: DC | PRN

## 2017-12-10 NOTE — ED Triage Notes (Signed)
The pt reports sickle cell crisis pain in both legs and body aches  lmp 12-1

## 2017-12-10 NOTE — ED Notes (Signed)
Pt reports pain 9/10. Pt complained to EMT she felt like medication became to diluted when given in higher port.

## 2017-12-10 NOTE — ED Provider Notes (Addendum)
[facility] Provider Note   CSN: [id number] Arrival date & time: 12/10/17  0041     History   Chief Complaint Chief Complaint  Patient presents with  . Sickle Cell Pain Crisis    HPI Hannah Hawkins is a 31 y.o. female.  Patient presents to the ER for evaluation of sickle cell crisis.  Patient reports that she is having pain in her lower back and both of her legs, as well as her right hand.  This is typical of previous sickle cell crisis.  Patient reports that the symptoms began 2 days ago when she ran out of her oxycodone which she takes chronically.  She has not had chest pain, fever or shortness of breath.      Past Medical History:  Diagnosis Date  . Sickle cell anemia [facility](HCC)     Patient Active Problem List   Diagnosis Date Noted  . Anemia of chronic disease 11/13/2017  . Sinusitis, chronic 11/13/2017  . 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 EYE SURGERY  03/09/2017   left eye    OB History    No data available       Home Medications    Prior to Admission medications   Medication Sig Start Date End Date Taking? Authorizing Provider  diphenhydrAMINE (BENADRYL) 25 MG tablet Take 25 mg by mouth every 6 (six) hours as needed for itching.   Yes [provider]  ergocalciferol (VITAMIN D2) 50000 units capsule Take 1 capsule (50,000 Units total) by mouth once a week. Patient taking differently: Take 50,000 Units by mouth every Saturday.  02/22/17  Yes Massie MaroonHollis, [name], FNP  fluticasone (FLONASE) 50 MCG/ACT nasal spray Place 2 sprays into both nostrils daily. Patient taking differently: Place 2 sprays into both nostrils daily as needed for allergies.  11/17/17  Yes Altha HarmMatthews, Michelle A, MD  ibuprofen (ADVIL,MOTRIN) 800 MG tablet Take 1 tablet (800 mg total) by mouth every 8 (eight) hours as needed for moderate pain. 06/27/17  Yes Massie MaroonHollis, [name], FNP    meclizine (ANTIVERT) 25 MG tablet Take 1 tablet (25 mg total) 3 (three) times daily as needed by mouth for dizziness. 10/31/17  Yes Massie MaroonHollis, [name], FNP  Multiple Vitamin (MULTIVITAMIN WITH MINERALS) TABS tablet Take 1 tablet by mouth daily.   Yes [provider]  naloxegol oxalate (MOVANTIK) 12.5 MG TABS tablet Take 1 tablet (12.5 mg total) by mouth daily. Patient taking differently: Take 12.5 mg by mouth daily as needed (constipation).  08/04/17  Yes Massie MaroonHollis, [name], FNP  ondansetron (ZOFRAN) 4 MG tablet Take 1 tablet (4 mg total) by mouth every 8 (eight) hours as needed for nausea or vomiting. 09/30/17  Yes Massie MaroonHollis, [name], FNP  pantoprazole (PROTONIX) 40 MG tablet Take 1 tablet (40 mg total) by mouth daily. 11/11/17 12/11/17 Yes Lorre NickAllen, Anthony, MD  cyclobenzaprine (FLEXERIL) 10 MG tablet Take 1 tablet (10 mg total) by mouth 3 (three) times daily as needed for muscle spasms. 12/10/17   Gilda CreasePollina, Molly Savarino J, MD  folic acid (FOLVITE) 1 MG tablet Take 1 tablet (1 mg total) by mouth daily. Patient not taking: Reported on 11/13/2017 11/11/17   Vassie LollMadera, Carlos, MD  Oxycodone HCl 10 MG TABS Take 1 tablet (10 mg total) by mouth every 6 (six) hours as needed (pain). 12/10/17   Gilda CreasePollina, Tevis Conger J, MD  predniSONE (DELTASONE) 20 MG tablet Take 2 tablets (40 mg total) by mouth  daily with breakfast. 12/10/17   Vihaan Gloss, Canary Brimhristopher J, MD    Family History No family history on file.  Social History Social History   Tobacco Use  . Smoking status: Former Games developermoker  . Smokeless tobacco: Never Used  Substance Use Topics  . Alcohol use: Yes    Comment: Twice a month  . Drug use: No     Allergies   Penicillins   Review of Systems Review of Systems  Respiratory: Negative for shortness of breath.   Cardiovascular: Negative for chest pain.  Musculoskeletal: Positive for arthralgias and back pain.  All other systems reviewed and are negative.    Physical Exam Updated Vital  Signs BP 102/69   Pulse 91   Temp 98.2 F (36.8 C) (Oral)   Resp 16   Ht 5\' 3"  (1.6 m)   Wt 67.6 kg (149 lb)   LMP 11/13/2017   SpO2 94%   BMI 26.39 kg/m   Physical Exam  Constitutional: She is oriented to person, place, and time. She appears well-developed and well-nourished. No distress.  HENT:  Head: Normocephalic and atraumatic.  Right Ear: Hearing normal.  Left Ear: Hearing normal.  Nose: Nose normal.  Mouth/Throat: Oropharynx is clear and moist and mucous membranes are normal.  Eyes: Conjunctivae and EOM are normal. Pupils are equal, round, and reactive to light.  Neck: Normal range of motion. Neck supple.  Cardiovascular: Regular rhythm, S1 normal and S2 normal. Exam reveals no gallop and no friction rub.  No murmur heard. Pulmonary/Chest: Effort normal and breath sounds normal. No respiratory distress. She exhibits no tenderness.  Abdominal: Soft. Normal appearance and bowel sounds are normal. There is no hepatosplenomegaly. There is no tenderness. There is no rebound, no guarding, no tenderness at McBurney's point and negative Murphy's sign. No hernia.  Musculoskeletal: Normal range of motion.  Neurological: She is alert and oriented to person, place, and time. She has normal strength. No cranial nerve deficit or sensory deficit. Coordination normal. GCS eye subscore is 4. GCS verbal subscore is 5. GCS motor subscore is 6.  Skin: Skin is warm, dry and intact. No rash noted. No cyanosis.  Psychiatric: She has a normal mood and affect. Her speech is normal and behavior is normal. Thought content normal.  Nursing note and vitals reviewed.    ED Treatments / Results  Labs (all labs ordered are listed, but only abnormal results are displayed) Labs Reviewed  COMPREHENSIVE METABOLIC PANEL - Abnormal; Notable for the following components:      Result Value   Glucose, Bld 115 (*)    AST 55 (*)    ALT 57 (*)    All other components within normal limits  CBC WITH  DIFFERENTIAL/PLATELET - Abnormal; Notable for the following components:   HCT 34.9 (*)    MCV 75.1 (*)    RDW 17.2 (*)    All other components within normal limits  RETICULOCYTES - Abnormal; Notable for the following components:   Retic Ct Pct 6.9 (*)    Retic Count, Absolute 320.9 (*)    All other components within normal limits  I-STAT BETA HCG BLOOD, ED ([facility], [facility], [facility] ONLY)    EKG  EKG Interpretation None       Radiology No results found.  Procedures Procedures (including critical care time)  Medications Ordered in ED Medications  0.45 % sodium chloride infusion ( Intravenous New Bag/Given 12/10/17 0312)  HYDROmorphone (DILAUDID) injection 2 mg (not administered)    Or  HYDROmorphone (DILAUDID)  injection 2 mg (not administered)  promethazine (PHENERGAN) tablet 25 mg (not administered)  HYDROmorphone (DILAUDID) injection 2 mg (not administered)  ketorolac (TORADOL) 30 MG/ML injection 30 mg (30 mg Intravenous Given 12/10/17 0318)  HYDROmorphone (DILAUDID) injection 2 mg (2 mg Intravenous Given 12/10/17 0321)    Or  HYDROmorphone (DILAUDID) injection 2 mg ( Subcutaneous See Alternative 12/10/17 0321)  HYDROmorphone (DILAUDID) injection 2 mg (2 mg Intravenous Given 12/10/17 0517)    Or  HYDROmorphone (DILAUDID) injection 2 mg ( Subcutaneous See Alternative 12/10/17 0517)  HYDROmorphone (DILAUDID) injection 2 mg (2 mg Intravenous Given 12/10/17 4098)    Or  HYDROmorphone (DILAUDID) injection 2 mg ( Subcutaneous See Alternative 12/10/17 0625)  diphenhydrAMINE (BENADRYL) injection 25 mg (25 mg Intravenous Given 12/10/17 0316)  ondansetron (ZOFRAN) injection 4 mg (4 mg Intravenous Given 12/10/17 1191)     Initial Impression / Assessment and Plan / ED Course  I have reviewed the triage vital signs and the nursing notes.  Pertinent labs & imaging results that were available during my care of the patient were reviewed by me and considered in my medical decision making (see  chart for details).     Patient presents to the ER for evaluation of back pain, leg pain and hand pain.  She reports a history of sickle cell disease.  Chart reflects a history of sickle cell [place] disease.  She does not have any anemia currently.  Her labs are entirely normal.  No concern for acute chest.  Further discussion with the patient reveals that she ran out of her pain medicine when her pain started.  This is exacerbation of chronic pain and pain medicine withdrawal, not likely to be a sickle cell crisis.  Patient given multiple doses of analgesia here in the ER.  She will need to contact her doctors for further chronic medications.  She does not require hospitalization.  Addendum:.  Upon attempting to discharge patient, patient reported that she was still experiencing pain.  She is not expensing pain behind the right knee that radiates from the right hip and buttock area.  She reports that this has been hurting for 6 months.  She was supposed to see orthopedics for this, but missed her appointment.  This is clearly not related to her sickle cell disease.  This sounds more like a sciatic nerve problem.  We will give her muscle relaxer and prednisone.  As it is the weekend of Christmas, she will not be able to get prescriptions filled.  Will give 12 tablets of her oxycodone to use as needed.  Final Clinical Impressions(s) / ED Diagnoses   Final diagnoses:  Sickle cell crisis (HCC)  Sciatica of right side    ED Discharge Orders        Ordered    Oxycodone HCl 10 MG TABS  Every 6 hours PRN     12/10/17 0820    predniSONE (DELTASONE) 20 MG tablet  Daily with breakfast     12/10/17 0820    cyclobenzaprine (FLEXERIL) 10 MG tablet  3 times daily PRN     12/10/17 0820       Gilda Crease, MD 12/10/17 0701    Gilda Crease, MD 12/10/17 [phone]

## 2017-12-10 NOTE — ED Notes (Signed)
Pt provided ice chips per her request. Pt requesting additional pain medication

## 2017-12-10 NOTE — ED Notes (Signed)
Pollina, MD to speak with pt re: request for pain med prescription

## 2017-12-10 NOTE — ED Notes (Signed)
Pt provided meal bag

## 2017-12-10 NOTE — ED Notes (Signed)
Pt called to nurses station, pt states she is no longer nauseated and would like a sandwich.

## 2017-12-10 NOTE — ED Notes (Signed)
Pt continues to endorse nausea but requesting sandwich. Empty food containers noted at bedside. Pt requesting Dilaudid be pushed through lower y-site in tubing, pt states "I didn't feel it last time". Medication pushed through upper y-site.

## 2017-12-12 ENCOUNTER — Other Ambulatory Visit: Payer: Self-pay

## 2017-12-12 ENCOUNTER — Emergency Department (HOSPITAL_COMMUNITY)
Admission: EM | Admit: 2017-12-12 | Discharge: 2017-12-12 | Disposition: A | Discharge status: Home / Self Care | Payer: Medicaid Other | Attending: Emergency Medicine | Admitting: Emergency Medicine

## 2017-12-12 ENCOUNTER — Encounter (HOSPITAL_COMMUNITY): Payer: Self-pay

## 2017-12-12 DIAGNOSIS — Z880 Allergy status to penicillin: Secondary | ICD-10-CM | POA: Diagnosis not present

## 2017-12-12 DIAGNOSIS — Z87891 Personal history of nicotine dependence: Secondary | ICD-10-CM | POA: Insufficient documentation

## 2017-12-12 DIAGNOSIS — M79669 Pain in unspecified lower leg: Secondary | ICD-10-CM | POA: Diagnosis present

## 2017-12-12 DIAGNOSIS — Z79899 Other long term (current) drug therapy: Secondary | ICD-10-CM | POA: Insufficient documentation

## 2017-12-12 DIAGNOSIS — D5700 Hb-SS disease with crisis, unspecified: Secondary | ICD-10-CM | POA: Insufficient documentation

## 2017-12-12 LAB — COMPREHENSIVE METABOLIC PANEL
ALBUMIN: 4 g/dL (ref 3.5–5.0)
ALT: 36 U/L (ref 14–54)
ANION GAP: 9 (ref 5–15)
AST: 41 U/L (ref 15–41)
Alkaline Phosphatase: 55 U/L (ref 38–126)
BILIRUBIN TOTAL: 0.8 mg/dL (ref 0.3–1.2)
BUN: 8 mg/dL (ref 6–20)
CHLORIDE: 106 mmol/L (ref 101–111)
CO2: 21 mmol/L — ABNORMAL LOW (ref 22–32)
Calcium: 9.3 mg/dL (ref 8.9–10.3)
Creatinine, Ser: 0.74 mg/dL (ref 0.44–1.00)
GFR calc Af Amer: >60 mL/min (ref >60)
GLUCOSE: 126 mg/dL — AB (ref 65–99)
POTASSIUM: 4.2 mmol/L (ref 3.5–5.1)
Sodium: 136 mmol/L (ref 135–145)
TOTAL PROTEIN: 7.3 g/dL (ref 6.5–8.1)

## 2017-12-12 LAB — CBC WITH DIFFERENTIAL/PLATELET
Basophils Absolute: 0 10*3/uL (ref 0.0–0.1)
Basophils Relative: 0 %
Eosinophils Absolute: 0 10*3/uL (ref 0.0–0.7)
Eosinophils Relative: 0 %
HEMATOCRIT: 34.9 % — AB (ref 36.0–46.0)
Hemoglobin: 12.7 g/dL (ref 12.0–15.0)
LYMPHS PCT: 10 %
Lymphs Abs: 1.2 10*3/uL (ref 0.7–4.0)
MCH: 27.3 pg (ref 26.0–34.0)
MCHC: 36.4 g/dL — ABNORMAL HIGH (ref 30.0–36.0)
MCV: 74.9 fL — AB (ref 78.0–100.0)
Monocytes Absolute: 0.8 10*3/uL (ref 0.1–1.0)
Monocytes Relative: 7 %
NEUTROS ABS: 10.2 10*3/uL — AB (ref 1.7–7.7)
Neutrophils Relative %: 83 %
Platelets: 325 10*3/uL (ref 150–400)
RBC: 4.66 MIL/uL (ref 3.87–5.11)
RDW: 16.6 % — ABNORMAL HIGH (ref 11.5–15.5)
WBC: 12.2 10*3/uL — AB (ref 4.0–10.5)

## 2017-12-12 LAB — RETICULOCYTES
RBC.: 4.66 MIL/uL (ref 3.87–5.11)
RETIC COUNT ABSOLUTE: 247 10*3/uL — AB (ref 19.0–186.0)
Retic Ct Pct: 5.3 % — ABNORMAL HIGH (ref 0.4–3.1)

## 2017-12-12 MED ORDER — HYDROMORPHONE HCL 1 MG/ML IJ SOLN
2.0000 mg | INTRAMUSCULAR | Status: AC
  Filled 2017-12-12: qty 2

## 2017-12-12 MED ORDER — ONDANSETRON 4 MG PO TBDP: 4.0000 mg | ORAL_TABLET | Freq: Once | ORAL | Status: DC

## 2017-12-12 MED ORDER — KETOROLAC TROMETHAMINE 15 MG/ML IJ SOLN
15.0000 mg | INTRAMUSCULAR | Status: AC
  Administered 2017-12-12: 15 mg via INTRAVENOUS
  Filled 2017-12-12: qty 1

## 2017-12-12 MED ORDER — HYDROMORPHONE HCL 1 MG/ML IJ SOLN: 2.0000 mg | INTRAMUSCULAR | Status: AC

## 2017-12-12 MED ORDER — HYDROMORPHONE HCL 1 MG/ML IJ SOLN
2.0000 mg | INTRAMUSCULAR | Status: AC
  Administered 2017-12-12: 2 mg via INTRAVENOUS

## 2017-12-12 MED ORDER — HYDROMORPHONE HCL 1 MG/ML IJ SOLN: 0.5000 mg | Freq: Once | INTRAMUSCULAR | Status: DC

## 2017-12-12 MED ORDER — DIPHENHYDRAMINE HCL 25 MG PO CAPS
25.0000 mg | ORAL_CAPSULE | ORAL | Status: DC | PRN
  Administered 2017-12-12: 25 mg via ORAL
  Filled 2017-12-12: qty 1

## 2017-12-12 MED ORDER — HYDROMORPHONE HCL 1 MG/ML IJ SOLN
2.0000 mg | Freq: Once | INTRAMUSCULAR | Status: AC
  Administered 2017-12-12: 2 mg via INTRAVENOUS
  Filled 2017-12-12: qty 2

## 2017-12-12 MED ORDER — ONDANSETRON HCL 4 MG/2ML IJ SOLN
4.0000 mg | INTRAMUSCULAR | Status: DC | PRN
  Administered 2017-12-12: 4 mg via INTRAVENOUS
  Filled 2017-12-12: qty 2

## 2017-12-12 MED ORDER — SODIUM CHLORIDE 0.45 % IV SOLN
INTRAVENOUS | Status: DC
  Administered 2017-12-12: 02:00:00 via INTRAVENOUS

## 2017-12-12 NOTE — ED Triage Notes (Signed)
Pt. From home via EMS. Pt. Reports bilateral leg pain since yesterday afternoon. Pt. Took cyclobenzaprine and prednisone with no relief. Pt. Was seen for  Bilateral ankle pain Saturday. NAD at this time.

## 2017-12-12 NOTE — ED Notes (Signed)
Pt. Wants to hold pain medicine until 0500. Will continue to monitor.

## 2017-12-12 NOTE — ED Provider Notes (Signed)
[facility] Provider [facility]   CSN: [id number] Arrival date & time: 12/12/17  0124     History   Chief Complaint No chief complaint on file.   HPI Hannah Hawkins is a 31 y.o. female.  Patient with history of sickle cell disease presents to the ED with persistent LE pain bilaterally. Pain is typical in location for previous crises. No fever or chest pain. No SOB, cough or vomiting. She reports being seen 2 days ago and was discharged with Percocet for was unable to fill the prescription because she is not due for a refill until later this week. She returns with uncontrolled pain.    The history is provided by the patient. No language interpreter was used.    Past Medical History:  Diagnosis Date  . Sickle cell anemia [facility](HCC)     Patient Active Problem List   Diagnosis Date Noted  . Anemia of chronic disease 11/13/2017  . Sinusitis, chronic 11/13/2017  . 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 EYE SURGERY  03/09/2017   left eye    OB History    No data available       Home Medications    Prior to Admission medications   Medication Sig Start Date End Date Taking? Authorizing Provider  cyclobenzaprine (FLEXERIL) 10 MG tablet Take 1 tablet (10 mg total) by mouth 3 (three) times daily as needed for muscle spasms. 12/10/17   Gilda CreasePollina, Christopher J, MD  diphenhydrAMINE (BENADRYL) 25 MG tablet Take 25 mg by mouth every 6 (six) hours as needed for itching.    [provider]  ergocalciferol (VITAMIN D2) 50000 units capsule Take 1 capsule (50,000 Units total) by mouth once a week. Patient taking differently: Take 50,000 Units by mouth every Saturday.  02/22/17   Massie MaroonHollis, [name], FNP  fluticasone (FLONASE) 50 MCG/ACT nasal spray Place 2 sprays into both nostrils daily. Patient taking differently: Place 2 sprays into both nostrils daily as needed for allergies.  11/17/17    Altha HarmMatthews, Michelle A, MD  folic acid (FOLVITE) 1 MG tablet Take 1 tablet (1 mg total) by mouth daily. Patient not taking: Reported on 11/13/2017 11/11/17   Vassie LollMadera, Carlos, MD  ibuprofen (ADVIL,MOTRIN) 800 MG tablet Take 1 tablet (800 mg total) by mouth every 8 (eight) hours as needed for moderate pain. 06/27/17   Massie MaroonHollis, [name], FNP  meclizine (ANTIVERT) 25 MG tablet Take 1 tablet (25 mg total) 3 (three) times daily as needed by mouth for dizziness. 10/31/17   Massie MaroonHollis, [name], FNP  Multiple Vitamin (MULTIVITAMIN WITH MINERALS) TABS tablet Take 1 tablet by mouth daily.    [provider]  naloxegol oxalate (MOVANTIK) 12.5 MG TABS tablet Take 1 tablet (12.5 mg total) by mouth daily. Patient taking differently: Take 12.5 mg by mouth daily as needed (constipation).  08/04/17   Massie MaroonHollis, [name], FNP  ondansetron (ZOFRAN) 4 MG tablet Take 1 tablet (4 mg total) by mouth every 8 (eight) hours as needed for nausea or vomiting. 09/30/17   Massie MaroonHollis, [name], FNP  Oxycodone HCl 10 MG TABS Take 1 tablet (10 mg total) by mouth every 6 (six) hours as needed (pain). 12/10/17   Gilda CreasePollina, Christopher J, MD  pantoprazole (PROTONIX) 40 MG tablet Take 1 tablet (40 mg total) by mouth daily. 11/11/17 12/11/17  Lorre NickAllen, Anthony, MD  predniSONE (DELTASONE) 20 MG tablet Take 2 tablets (40 mg total) by mouth daily with  breakfast. 12/10/17   Gilda Crease, MD    Family History History reviewed. No pertinent family history.  Social History Social History   Tobacco Use  . Smoking status: Former Games developer  . Smokeless tobacco: Never Used  Substance Use Topics  . Alcohol use: Yes    Comment: Twice a month  . Drug use: No     Allergies   Penicillins   Review of Systems Review of Systems  Constitutional: Negative for chills and fever.  Respiratory: Negative.  Negative for cough and shortness of breath.   Cardiovascular: Negative.  Negative for chest pain and leg swelling.  Gastrointestinal:  Negative.  Negative for abdominal pain and vomiting.  Musculoskeletal:       See HPI.  Skin: Negative.   Neurological: Negative.      Physical Exam Updated Vital Signs BP 115/75   Pulse 94   Temp 98.8 F (37.1 C) (Oral)   Resp 19   Ht 5\' 3"  (1.6 m)   Wt 67.6 kg (149 lb)   LMP 11/13/2017   SpO2 99%   BMI 26.39 kg/m   Physical Exam  Constitutional: She is oriented to person, place, and time. She appears well-developed and well-nourished.  HENT:  Head: Normocephalic.  Neck: Normal range of motion. Neck supple.  Cardiovascular: Normal rate, regular rhythm and intact distal pulses.  Pulmonary/Chest: Effort normal and breath sounds normal.  Abdominal: Soft. Bowel sounds are normal. There is no tenderness. There is no rebound and no guarding.  Musculoskeletal: Normal range of motion. She exhibits no edema.  LE's generally tender to palpation. No swelling or redness.   Neurological: She is alert and oriented to person, place, and time.  Skin: Skin is warm and dry. No rash noted.  Psychiatric: She has a normal mood and affect.     ED Treatments / Results  Labs (all labs ordered are listed, but only abnormal results are displayed) Labs Reviewed  COMPREHENSIVE METABOLIC PANEL - Abnormal; Notable for the following components:      Result Value   CO2 21 (*)    Glucose, Bld 126 (*)    All other components within normal limits  CBC WITH DIFFERENTIAL/PLATELET - Abnormal; Notable for the following components:   WBC 12.2 (*)    HCT 34.9 (*)    MCV 74.9 (*)    MCHC 36.4 (*)    RDW 16.6 (*)    Neutro Abs 10.2 (*)    All other components within normal limits  RETICULOCYTES - Abnormal; Notable for the following components:   Retic Ct Pct 5.3 (*)    Retic Count, Absolute 247.0 (*)    All other components within normal limits  I-STAT BETA HCG BLOOD, ED ([facility], [facility], [facility] ONLY)    EKG  EKG Interpretation None       Radiology No results found.  Procedures Procedures (including  critical care time)  Medications Ordered in ED Medications  0.45 % sodium chloride infusion ( Intravenous New Bag/Given 12/12/17 0229)  HYDROmorphone (DILAUDID) injection 2 mg (not administered)    Or  HYDROmorphone (DILAUDID) injection 2 mg (not administered)  HYDROmorphone (DILAUDID) injection 2 mg (not administered)    Or  HYDROmorphone (DILAUDID) injection 2 mg (not administered)  diphenhydrAMINE (BENADRYL) capsule 25-50 mg (25 mg Oral Given 12/12/17 0238)  ondansetron (ZOFRAN) injection 4 mg (4 mg Intravenous Given 12/12/17 0238)  ketorolac (TORADOL) 15 MG/ML injection 15 mg (15 mg Intravenous Given 12/12/17 0223)  HYDROmorphone (DILAUDID) injection 2 mg (2  mg Intravenous Given 12/12/17 0232)    Or  HYDROmorphone (DILAUDID) injection 2 mg ( Subcutaneous See Alternative 12/12/17 0232)     Initial Impression / Assessment and Plan / ED Course  I have reviewed the triage vital signs and the nursing notes.  Pertinent labs & imaging results that were available during my care of the patient were reviewed by me and considered in my medical decision making (see chart for details).     Patient with h/o sickle cell presents with pain typical of crisis. Seen 2 days ago but returns with uncontrolled symptoms. No fever.   Patient is hemodynamically stable. She remains afebrile and labs are essentially unremarkable. Pain is adequately controlled, per patient, and she is ready for discharge home. Discussed reasons why she ran out of her pain medication ahead of schedule and the need to follow up with her doctor for further pain management.     Final Clinical Impressions(s) / ED Diagnoses   Final diagnoses:  None   1. Sickle cell anemia with pain  ED Discharge Orders    None       Elpidio AnisUpstill, Reginald Mangels, PA-C 12/12/17 0617    Ward, Layla MawKristen N, DO 12/12/17 [phone]

## 2017-12-14 ENCOUNTER — Telehealth (HOSPITAL_COMMUNITY): Payer: Self-pay | Admitting: Internal Medicine

## 2017-12-14 ENCOUNTER — Emergency Department (HOSPITAL_COMMUNITY)
Admission: EM | Admit: 2017-12-14 | Discharge: 2017-12-14 | Disposition: A | Discharge status: Home / Self Care | Payer: Medicaid Other | Attending: Emergency Medicine | Admitting: Emergency Medicine

## 2017-12-14 DIAGNOSIS — Z79899 Other long term (current) drug therapy: Secondary | ICD-10-CM | POA: Diagnosis not present

## 2017-12-14 DIAGNOSIS — R42 Dizziness and giddiness: Secondary | ICD-10-CM | POA: Diagnosis not present

## 2017-12-14 DIAGNOSIS — D57219 Sickle-cell/Hb-C disease with crisis, unspecified: Secondary | ICD-10-CM | POA: Insufficient documentation

## 2017-12-14 DIAGNOSIS — D5700 Hb-SS disease with crisis, unspecified: Secondary | ICD-10-CM

## 2017-12-14 DIAGNOSIS — D571 Sickle-cell disease without crisis: Secondary | ICD-10-CM

## 2017-12-14 DIAGNOSIS — Z87891 Personal history of nicotine dependence: Secondary | ICD-10-CM | POA: Diagnosis not present

## 2017-12-14 DIAGNOSIS — Z79891 Long term (current) use of opiate analgesic: Secondary | ICD-10-CM

## 2017-12-14 LAB — COMPREHENSIVE METABOLIC PANEL
ALK PHOS: 49 U/L (ref 38–126)
ALT: 22 U/L (ref 14–54)
AST: 20 U/L (ref 15–41)
Albumin: 3.8 g/dL (ref 3.5–5.0)
Anion gap: 5 (ref 5–15)
BILIRUBIN TOTAL: 0.6 mg/dL (ref 0.3–1.2)
BUN: 9 mg/dL (ref 6–20)
CALCIUM: 9 mg/dL (ref 8.9–10.3)
CO2: 25 mmol/L (ref 22–32)
Chloride: 106 mmol/L (ref 101–111)
Creatinine, Ser: 0.68 mg/dL (ref 0.44–1.00)
GFR calc Af Amer: >60 mL/min (ref >60)
GLUCOSE: 122 mg/dL — AB (ref 65–99)
POTASSIUM: 4.3 mmol/L (ref 3.5–5.1)
Sodium: 136 mmol/L (ref 135–145)
TOTAL PROTEIN: 7 g/dL (ref 6.5–8.1)

## 2017-12-14 LAB — I-STAT BETA HCG BLOOD, ED (MC, WL, AP ONLY)

## 2017-12-14 LAB — CBC WITH DIFFERENTIAL/PLATELET
BASOS ABS: 0.1 10*3/uL (ref 0.0–0.1)
BASOS PCT: 1 %
EOS ABS: 0.3 10*3/uL (ref 0.0–0.7)
EOS PCT: 3 %
HCT: 31.3 % — ABNORMAL LOW (ref 36.0–46.0)
Hemoglobin: 11.5 g/dL — ABNORMAL LOW (ref 12.0–15.0)
LYMPHS PCT: 31 %
Lymphs Abs: 2.7 10*3/uL (ref 0.7–4.0)
MCH: 26.6 pg (ref 26.0–34.0)
MCHC: 36.7 g/dL — ABNORMAL HIGH (ref 30.0–36.0)
MCV: 72.5 fL — AB (ref 78.0–100.0)
MONO ABS: 0.9 10*3/uL (ref 0.1–1.0)
Monocytes Relative: 11 %
Neutro Abs: 4.8 10*3/uL (ref 1.7–7.7)
Neutrophils Relative %: 54 %
PLATELETS: 321 10*3/uL (ref 150–400)
RBC: 4.32 MIL/uL (ref 3.87–5.11)
WBC: 8.8 10*3/uL (ref 4.0–10.5)

## 2017-12-14 LAB — RETICULOCYTES
RBC.: 4.54 MIL/uL (ref 3.87–5.11)
RETIC COUNT ABSOLUTE: 158.9 10*3/uL (ref 19.0–186.0)
RETIC CT PCT: 3.5 % — AB (ref 0.4–3.1)

## 2017-12-14 MED ORDER — DEXTROSE-NACL 5-0.45 % IV SOLN
INTRAVENOUS | Status: DC
  Administered 2017-12-14: 04:00:00 via INTRAVENOUS

## 2017-12-14 MED ORDER — KETOROLAC TROMETHAMINE 30 MG/ML IJ SOLN
30.0000 mg | INTRAMUSCULAR | Status: AC
  Administered 2017-12-14: 30 mg via INTRAVENOUS
  Filled 2017-12-14: qty 1

## 2017-12-14 MED ORDER — MECLIZINE HCL 25 MG PO TABS
25.0000 mg | ORAL_TABLET | Freq: Once | ORAL | Status: AC
  Administered 2017-12-14: 25 mg via ORAL
  Filled 2017-12-14: qty 1

## 2017-12-14 MED ORDER — DIPHENHYDRAMINE HCL 25 MG PO CAPS
50.0000 mg | ORAL_CAPSULE | Freq: Once | ORAL | Status: AC
Start: 2017-12-14 — End: 2017-12-14
  Administered 2017-12-14: 50 mg via ORAL
  Filled 2017-12-14: qty 2

## 2017-12-14 MED ORDER — OXYCODONE-ACETAMINOPHEN 5-325 MG PO TABS
2.0000 | ORAL_TABLET | Freq: Once | ORAL | Status: AC
  Administered 2017-12-14: 2 via ORAL
  Filled 2017-12-14: qty 2

## 2017-12-14 MED ORDER — HYDROMORPHONE HCL 2 MG/ML IJ SOLN
2.0000 mg | Freq: Once | INTRAMUSCULAR | Status: AC
  Administered 2017-12-14: 2 mg via INTRAVENOUS
  Filled 2017-12-14: qty 1

## 2017-12-14 MED ORDER — PROMETHAZINE HCL 25 MG/ML IJ SOLN
12.5000 mg | Freq: Once | INTRAMUSCULAR | Status: AC
  Administered 2017-12-14: 12.5 mg via INTRAVENOUS
  Filled 2017-12-14: qty 1

## 2017-12-14 NOTE — Telephone Encounter (Signed)
Pt arrived in waiting area, states experiencing pain, generalized; [facility]; denies chest pain, shortness of breath; NP notified; pt advised to go home hydrate and take ibuprofen for additional pain management; pt given name and phone number of pain management clinic for follow-up on getting additional home medications for pain management; pt verbalizes understanding that if chest pain or shortness of breath occur to seek emergency medical treatment

## 2017-12-14 NOTE — ED Notes (Signed)
Patient confused about being discharged. Patient waiting to speak to PA.

## 2017-12-14 NOTE — ED Provider Notes (Signed)
[facility]-EMERGENCY DEPT Provider Note   CSN: [id number] Arrival date & time: 12/14/17  0202    History   Chief Complaint Chief Complaint  Patient presents with  . Dizziness  . Sickle Cell Pain Crisis    HPI Hannah ConferBeatrice Kmetz is a 31 y.o. female.  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  She reports pain in her right lower extremity which is aching and has been constant today.  She has had similar symptoms for which she [facility] ago as well as on 12/10/2017.  She is currently out of her Percocet prescription and cannot get a refill until Friday.  She is out of her medication because she was taking more than prescribed.  She has not tried any Tylenol or ibuprofen for her pain today.  No associated fevers, chest pain, shortness of breath.  Patient does report some dizziness.  This is characterized as feeling off balance, worse with position change.  She has had some associated nausea and reports a history of vertigo which is usually relieved with meclizine.  She took a tablet of meclizine this morning without improvement in her dizziness.  Dizziness has been intermittent and waxing and waning in severity.  She denies any associated headache.   The history is provided by the patient. No language interpreter was used.  Dizziness  Sickle Cell Pain Crisis    Past Medical History:  Diagnosis Date  . Sickle cell anemia [facility](HCC)     Patient Active Problem List   Diagnosis Date Noted  . Anemia of chronic disease 11/13/2017  . Sinusitis, chronic 11/13/2017  . 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 EYE SURGERY  03/09/2017   left eye    OB History    No data available       Home Medications    Prior to Admission medications   Medication Sig Start  Date End Date Taking? Authorizing Provider  cyclobenzaprine (FLEXERIL) 10 MG tablet Take 1 tablet (10 mg total) by mouth 3 (three) times daily as needed for muscle spasms. 12/10/17  Yes Pollina, Canary Brimhristopher J, MD  diphenhydrAMINE (BENADRYL) 25 MG tablet Take 25 mg by mouth every 6 (six) hours as needed for itching.   Yes [provider]  ergocalciferol (VITAMIN D2) 50000 units capsule Take 1 capsule (50,000 Units total) by mouth once a week. Patient taking differently: Take 50,000 Units by mouth every Saturday.  02/22/17  Yes Massie MaroonHollis, [name], FNP  fluticasone (FLONASE) 50 MCG/ACT nasal spray Place 2 sprays into both nostrils daily. Patient taking differently: Place 2 sprays into both nostrils daily as needed for allergies.  11/17/17  Yes Altha HarmMatthews, Michelle A, MD  ibuprofen (ADVIL,MOTRIN) 800 MG tablet Take 1 tablet (800 mg total) by mouth every 8 (eight) hours as needed for moderate pain. 06/27/17  Yes Massie MaroonHollis, [name], FNP  meclizine (ANTIVERT) 25 MG tablet Take 1 tablet (25 mg total) 3 (three) times daily as needed by mouth for dizziness. 10/31/17  Yes Massie MaroonHollis, [name], FNP  Multiple Vitamin (MULTIVITAMIN WITH MINERALS) TABS tablet Take 1 tablet by mouth daily.   Yes [provider]  naloxegol oxalate (MOVANTIK) 12.5 MG TABS tablet Take 1 tablet (12.5 mg total) by mouth daily. Patient taking differently: Take 12.5 mg by mouth daily as needed (constipation).  08/04/17  Yes Massie Maroon, FNP  ondansetron (ZOFRAN) 4 MG tablet Take 1 tablet (4 mg total) by mouth every 8 (eight) hours as needed for nausea or vomiting. 09/30/17  Yes Massie Maroon, FNP  Oxycodone HCl 10 MG TABS Take 1 tablet (10 mg total) by mouth every 6 (six) hours as needed (pain). 12/10/17  Yes Pollina, Canary Brim, MD  folic acid (FOLVITE) 1 MG tablet Take 1 tablet (1 mg total) by mouth daily. Patient not taking: Reported on 11/13/2017 11/11/17   Vassie Loll, MD  predniSONE (DELTASONE) 20 MG tablet Take  2 tablets (40 mg total) by mouth daily with breakfast. 12/10/17   Gilda Crease, MD    Family History No family history on file.  Social History Social History   Tobacco Use  . Smoking status: Former Games developer  . Smokeless tobacco: Never Used  Substance Use Topics  . Alcohol use: Yes    Comment: Twice a month  . Drug use: No     Allergies   Penicillins   Review of Systems Review of Systems  Neurological: Positive for dizziness.  Ten systems reviewed and are negative for acute change, except as noted in the HPI.    Physical Exam Updated Vital Signs BP 117/77 (BP Location: Left Arm)   Pulse 89   Temp 98.1 F (36.7 C) (Oral)   Resp 14   Ht 5\' 3"  (1.6 m)   Wt 67.6 kg (149 lb)   SpO2 100%   BMI 26.39 kg/m   Physical Exam  Constitutional: She is oriented to person, place, and time. She appears well-developed and well-nourished. No distress.  Nontoxic appearing and in no acute distress  HENT:  Head: Normocephalic and atraumatic.  Symmetric rise of the uvula with phonation  Eyes: Conjunctivae and EOM are normal. Pupils are equal, round, and reactive to light. No scleral icterus.  Neck: Normal range of motion.  No nuchal rigidity or meningismus  Cardiovascular: Normal rate, regular rhythm and intact distal pulses.  Pulmonary/Chest: Effort normal. No stridor. No respiratory distress. She has no wheezes.  Lungs CTAB  Musculoskeletal: Normal range of motion.  Neurological: She is alert and oriented to person, place, and time. No cranial nerve deficit. She exhibits normal muscle tone. Coordination normal.  GCS 15. Speech is goal oriented. No cranial nerve deficits appreciated; symmetric eyebrow raise, no facial drooping, tongue midline. Patient has equal grip strength bilaterally with 5/5 strength against resistance in all major muscle groups bilaterally. Sensation to light touch intact. Patient moves extremities without ataxia.  Skin: Skin is warm and dry. No rash  noted. She is not diaphoretic. No erythema. No pallor.  Psychiatric: She has a normal mood and affect. Her behavior is normal.  Nursing note and vitals reviewed.    ED Treatments / Results  Labs (all labs ordered are listed, but only abnormal results are displayed) Labs Reviewed  COMPREHENSIVE METABOLIC PANEL - Abnormal; Notable for the following components:      Result Value   Glucose, Bld 122 (*)    All other components within normal limits  CBC WITH DIFFERENTIAL/PLATELET - Abnormal; Notable for the following components:   Hemoglobin 11.5 (*)    HCT 31.3 (*)    MCV 72.5 (*)    MCHC 36.7 (*)    All other components within normal limits  RETICULOCYTES - Abnormal; Notable for the following components:   Retic Ct Pct 3.5 (*)    All other components within normal limits  I-STAT BETA HCG  BLOOD, ED ([facility], [facility], [facility] ONLY)    EKG  EKG Interpretation None       Radiology No results found.  Procedures Procedures (including critical care time)  Medications Ordered in ED Medications  dextrose 5 %-0.45 % sodium chloride infusion ( Intravenous New Bag/Given 12/14/17 0415)  oxyCODONE-acetaminophen (PERCOCET/ROXICET) 5-325 MG per tablet 2 tablet (not administered)  ketorolac (TORADOL) 30 MG/ML injection 30 mg (30 mg Intravenous Given 12/14/17 0415)  promethazine (PHENERGAN) injection 12.5 mg (12.5 mg Intravenous Given 12/14/17 0417)  meclizine (ANTIVERT) tablet 25 mg (25 mg Oral Given 12/14/17 0415)  diphenhydrAMINE (BENADRYL) capsule 50 mg (50 mg Oral Given 12/14/17 0415)  HYDROmorphone (DILAUDID) injection 2 mg (2 mg Intravenous Given 12/14/17 0413)  HYDROmorphone (DILAUDID) injection 2 mg (2 mg Intravenous Given 12/14/17 0533)    6:00 AM On repeat assessment, patient scores her pain at 7/10.  She states that her pain on the day-to-day is 6/10.  This is an improvement from her pain on arrival which she rated at 10/10. Dizziness has resolved. Doubt central cause of symptoms.  Neurologic exam nonfocal. She is amenable to further follow up with [facility] after a dose of her home Percocet.   Initial Impression / Assessment and Plan / ED Course  I have reviewed the triage vital signs and the nursing notes.  Pertinent labs & imaging results that were available during my care of the patient were reviewed by me and considered in my medical decision making (see chart for details).     31 year old female with history of sickle cell anemia presents for worsening leg pain.  She has a history of lower extremity pain with her sickle cell crisis and states this feels similar.  She is also complaining of dizziness and has a history of vertigo.  No focal deficits on exam.  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  Patient afebrile.  She denies chest pain and shortness of breath.  Laboratory workup consistent with baseline.  She has had improvement in both her pain and dizziness with IV medications.  On repeat assessment, she expresses comfort with follow-up with the sickle cell clinic today if needed.  She has been seen frequently over the past 4 days for persistent sickle cell pain.  This is largely thought to be due to overuse of her home narcotic medication as that she is out of her oxycodone and it is not able to be re-prescribed until Friday.  Have counseled on appropriate use of prescribed medication.  Return precautions discussed and provided. Patient discharged in stable condition with no unaddressed concerns.   Vitals:   12/14/17 0329 12/14/17 0400 12/14/17 0500 12/14/17 0530  BP:      Pulse: 77 68 89 89  Resp:      Temp:      TempSrc:      SpO2: 97% 100% 100% 100%  Weight:      Height:        Final Clinical Impressions(s) / ED Diagnoses   Final diagnoses:  Sickle cell anemia with pain [facility](HCC)  Vertigo    ED Discharge Orders    None       Antony MaduraHumes, Baden Betsch, PA-C 12/14/17 0610    Dione BoozeGlick, David, MD 12/14/17 [phone]

## 2017-12-14 NOTE — ED Triage Notes (Signed)
Patient is from home and [facility]. Patient is complaining of right leg pain and dizziness with nausea. [facility] for same symptoms. Patient is ambulatory from the EMS truck to triage.

## 2017-12-21 ENCOUNTER — Other Ambulatory Visit: Payer: Self-pay

## 2017-12-21 ENCOUNTER — Emergency Department (HOSPITAL_COMMUNITY)
Admission: EM | Admit: 2017-12-21 | Discharge: 2017-12-21 | Disposition: A | Discharge status: Home / Self Care | Payer: Medicaid Other | Attending: Emergency Medicine | Admitting: Emergency Medicine

## 2017-12-21 ENCOUNTER — Emergency Department (HOSPITAL_COMMUNITY): Payer: Medicaid Other

## 2017-12-21 ENCOUNTER — Encounter (HOSPITAL_COMMUNITY): Payer: Self-pay

## 2017-12-21 DIAGNOSIS — R070 Pain in throat: Secondary | ICD-10-CM | POA: Diagnosis not present

## 2017-12-21 DIAGNOSIS — Z87891 Personal history of nicotine dependence: Secondary | ICD-10-CM | POA: Insufficient documentation

## 2017-12-21 DIAGNOSIS — J3489 Other specified disorders of nose and nasal sinuses: Secondary | ICD-10-CM | POA: Insufficient documentation

## 2017-12-21 DIAGNOSIS — R05 Cough: Secondary | ICD-10-CM | POA: Insufficient documentation

## 2017-12-21 DIAGNOSIS — Z79899 Other long term (current) drug therapy: Secondary | ICD-10-CM | POA: Insufficient documentation

## 2017-12-21 DIAGNOSIS — Z789 Other specified health status: Secondary | ICD-10-CM | POA: Diagnosis not present

## 2017-12-21 DIAGNOSIS — D5720 Sickle-cell/Hb-C disease without crisis: Secondary | ICD-10-CM | POA: Insufficient documentation

## 2017-12-21 LAB — CBC WITH DIFFERENTIAL/PLATELET
Basophils Absolute: 0.1 10*3/uL (ref 0.0–0.1)
Basophils Relative: 1 %
Eosinophils Absolute: 1 10*3/uL — ABNORMAL HIGH (ref 0.0–0.7)
Eosinophils Relative: 9 %
HEMATOCRIT: 35.1 % — AB (ref 36.0–46.0)
HEMOGLOBIN: 12.8 g/dL (ref 12.0–15.0)
LYMPHS PCT: 34 %
Lymphs Abs: 3.7 10*3/uL (ref 0.7–4.0)
MCH: 27.1 pg (ref 26.0–34.0)
MCHC: 36.5 g/dL — ABNORMAL HIGH (ref 30.0–36.0)
MCV: 74.4 fL — AB (ref 78.0–100.0)
MONOS PCT: 7 %
Monocytes Absolute: 0.8 10*3/uL (ref 0.1–1.0)
NEUTROS ABS: 5.5 10*3/uL (ref 1.7–7.7)
NEUTROS PCT: 49 %
Platelets: 353 10*3/uL (ref 150–400)
RBC: 4.72 MIL/uL (ref 3.87–5.11)
RDW: 15.4 % (ref 11.5–15.5)
WBC: 11 10*3/uL — AB (ref 4.0–10.5)

## 2017-12-21 LAB — RETICULOCYTES
RBC.: 4.72 MIL/uL (ref 3.87–5.11)
RETIC CT PCT: 3.1 % (ref 0.4–3.1)
Retic Count, Absolute: 146.3 10*3/uL (ref 19.0–186.0)

## 2017-12-21 LAB — I-STAT CG4 LACTIC ACID, ED
LACTIC ACID, VENOUS: 0.84 mmol/L (ref 0.5–1.9)
Lactic Acid, Venous: 2.32 mmol/L (ref 0.5–1.9)

## 2017-12-21 LAB — COMPREHENSIVE METABOLIC PANEL
ALT: 24 U/L (ref 14–54)
AST: 24 U/L (ref 15–41)
Albumin: 4.1 g/dL (ref 3.5–5.0)
Alkaline Phosphatase: 52 U/L (ref 38–126)
Anion gap: 6 (ref 5–15)
BUN: 8 mg/dL (ref 6–20)
CHLORIDE: 106 mmol/L (ref 101–111)
CO2: 25 mmol/L (ref 22–32)
Calcium: 8.9 mg/dL (ref 8.9–10.3)
Creatinine, Ser: 0.71 mg/dL (ref 0.44–1.00)
Glucose, Bld: 112 mg/dL — ABNORMAL HIGH (ref 65–99)
POTASSIUM: 3.2 mmol/L — AB (ref 3.5–5.1)
SODIUM: 137 mmol/L (ref 135–145)
Total Bilirubin: 0.8 mg/dL (ref 0.3–1.2)
Total Protein: 7.3 g/dL (ref 6.5–8.1)

## 2017-12-21 LAB — I-STAT BETA HCG BLOOD, ED (MC, WL, AP ONLY)

## 2017-12-21 LAB — RAPID STREP SCREEN (MED CTR MEBANE ONLY): STREPTOCOCCUS, GROUP A SCREEN (DIRECT): NEGATIVE

## 2017-12-21 MED ORDER — OXYCODONE-ACETAMINOPHEN 5-325 MG PO TABS
2.0000 | ORAL_TABLET | Freq: Once | ORAL | Status: AC
  Administered 2017-12-21: 2 via ORAL
  Filled 2017-12-21: qty 2

## 2017-12-21 MED ORDER — SODIUM CHLORIDE 0.9 % IV BOLUS (SEPSIS)
1000.0000 mL | Freq: Once | INTRAVENOUS | Status: AC
  Administered 2017-12-21: 1000 mL via INTRAVENOUS

## 2017-12-21 MED ORDER — KETOROLAC TROMETHAMINE 30 MG/ML IJ SOLN
15.0000 mg | Freq: Once | INTRAMUSCULAR | Status: AC
  Administered 2017-12-21: 15 mg via INTRAVENOUS
  Filled 2017-12-21: qty 1

## 2017-12-21 MED ORDER — FENTANYL CITRATE (PF) 100 MCG/2ML IJ SOLN
50.0000 ug | Freq: Once | INTRAMUSCULAR | Status: AC
  Administered 2017-12-21: 50 ug via INTRAVENOUS
  Filled 2017-12-21: qty 2

## 2017-12-21 MED ORDER — SODIUM CHLORIDE 0.45 % IV SOLN
INTRAVENOUS | Status: DC
  Administered 2017-12-21: 11:00:00 via INTRAVENOUS

## 2017-12-21 NOTE — ED Provider Notes (Addendum)
[facility] EMERGENCY DEPARTMENT Provider Note   CSN: [id number] Arrival date & time: 12/21/17  [id number]     History   Chief Complaint Chief Complaint  Patient presents with  . Sickle Cell Pain Crisis    HPI Hannah Hawkins is a 32 y.o. female.  HPI 32 year old female history of [place] disease presents today complaining of cough, rhinorrhea, sore throat for 3 days.  She has been out of her medications but clarifies she has been taking her hydroxyurea and has not had her pain medication.  She denies inability to tolerate fluids.  She states she has had some vomiting but has been drinking fluids.  She denies any diarrhea.  She denies any sick.  She states she has not had denies rashes or abdominal pain. Past Medical History:  Diagnosis Date  . Sickle cell anemia [facility](HCC)     Patient Active Problem List   Diagnosis Date Noted  . Anemia of chronic disease 11/13/2017  . Sinusitis, chronic 11/13/2017  . 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 EYE SURGERY  03/09/2017   left eye    OB History    No data available       Home Medications    Prior to Admission medications   Medication Sig Start Date End Date Taking? Authorizing Provider  cyclobenzaprine (FLEXERIL) 10 MG tablet Take 1 tablet (10 mg total) by mouth 3 (three) times daily as needed for muscle spasms. 12/10/17   Gilda CreasePollina, Christopher J, MD  diphenhydrAMINE (BENADRYL) 25 MG tablet Take 25 mg by mouth every 6 (six) hours as needed for itching.    [provider]  ergocalciferol (VITAMIN D2) 50000 units capsule Take 1 capsule (50,000 Units total) by mouth once a week. Patient taking differently: Take 50,000 Units by mouth every Saturday.  02/22/17   Massie MaroonHollis, [name], FNP  fluticasone (FLONASE) 50 MCG/ACT nasal spray Place 2 sprays into both nostrils daily. Patient taking differently: Place 2 sprays into both nostrils daily as needed for  allergies.  11/17/17   Altha HarmMatthews, Michelle A, MD  folic acid (FOLVITE) 1 MG tablet Take 1 tablet (1 mg total) by mouth daily. Patient not taking: Reported on 11/13/2017 11/11/17   Vassie LollMadera, Carlos, MD  ibuprofen (ADVIL,MOTRIN) 800 MG tablet Take 1 tablet (800 mg total) by mouth every 8 (eight) hours as needed for moderate pain. 06/27/17   Massie MaroonHollis, [name], FNP  meclizine (ANTIVERT) 25 MG tablet Take 1 tablet (25 mg total) 3 (three) times daily as needed by mouth for dizziness. 10/31/17   Massie MaroonHollis, [name], FNP  Multiple Vitamin (MULTIVITAMIN WITH MINERALS) TABS tablet Take 1 tablet by mouth daily.    [provider]  naloxegol oxalate (MOVANTIK) 12.5 MG TABS tablet Take 1 tablet (12.5 mg total) by mouth daily. Patient taking differently: Take 12.5 mg by mouth daily as needed (constipation).  08/04/17   Massie MaroonHollis, [name], FNP  ondansetron (ZOFRAN) 4 MG tablet Take 1 tablet (4 mg total) by mouth every 8 (eight) hours as needed for nausea or vomiting. 09/30/17   Massie MaroonHollis, [name], FNP  Oxycodone HCl 10 MG TABS Take 1 tablet (10 mg total) by mouth every 6 (six) hours as needed (pain). 12/10/17   Gilda CreasePollina, Christopher J, MD  predniSONE (DELTASONE) 20 MG tablet Take 2 tablets (40 mg total) by mouth daily with breakfast. 12/10/17   Pollina, Canary Brimhristopher J, MD    Family History No family history on file.  Social History Social History   Tobacco Use  . Smoking status: Former Games developer  . Smokeless tobacco: Never Used  Substance Use Topics  . Alcohol use: Yes    Comment: Twice a month  . Drug use: No     Allergies   Penicillins   Review of Systems Review of Systems  All other systems reviewed and are negative.    Physical Exam Updated Vital Signs BP (!) 123/97   Pulse 77   Temp 98.9 F (37.2 C) (Oral)   Resp 12   LMP 12/17/2017   SpO2 100%   Physical Exam  Constitutional: She appears well-developed and well-nourished. No distress.  HENT:  Head: Normocephalic and atraumatic.    Right Ear: External ear normal.  Left Ear: External ear normal.  Mouth/Throat: Oropharynx is clear and moist.  Eyes: Pupils are equal, round, and reactive to light.  Neck: Normal range of motion.  Cardiovascular: Normal rate, regular rhythm and normal heart sounds.  Pulmonary/Chest: Effort normal and breath sounds normal.  Abdominal: Soft.  Musculoskeletal: Normal range of motion.  Skin: Skin is warm and dry. Capillary refill takes less than 2 seconds.  Psychiatric: She has a normal mood and affect.  Nursing note and vitals reviewed.    ED Treatments / Results  Labs (all labs ordered are listed, but only abnormal results are displayed) Labs Reviewed  COMPREHENSIVE METABOLIC PANEL - Abnormal; Notable for the following components:      Result Value   Potassium 3.2 (*)    Glucose, Bld 112 (*)    All other components within normal limits  CBC WITH DIFFERENTIAL/PLATELET - Abnormal; Notable for the following components:   WBC 11.0 (*)    HCT 35.1 (*)    MCV 74.4 (*)    MCHC 36.5 (*)    Eosinophils Absolute 1.0 (*)    All other components within normal limits  RETICULOCYTES  I-STAT BETA HCG BLOOD, ED ([facility], [facility], [facility] ONLY)  I-STAT CG4 LACTIC ACID, ED    EKG  EKG Interpretation None       Radiology Dg Chest Port 1 View  Result Date: 12/21/2017 CLINICAL DATA:  nasal congestion, ear pain, cough on Saturday.hx sickle cell-bilateral leg pain EXAM: PORTABLE CHEST 1 VIEW COMPARISON:  None. FINDINGS: Normal mediastinum and cardiac silhouette. Normal pulmonary vasculature. No evidence of effusion, infiltrate, or pneumothorax. No acute bony abnormality. IMPRESSION: Normal chest radiograph. Electronically Signed   By: Genevive Bi M.D.   On: 12/21/2017 11:07    Procedures Procedures (including critical care time)  Medications Ordered in ED Medications  0.45 % sodium chloride infusion ( Intravenous New Bag/Given 12/21/17 1118)  ketorolac (TORADOL) 30 MG/ML injection 15 mg (15 mg  Intravenous Given 12/21/17 1109)  oxyCODONE-acetaminophen (PERCOCET/ROXICET) 5-325 MG per tablet 2 tablet (2 tablets Oral Given 12/21/17 1108)     Initial Impression / Assessment and Plan / ED Course  I have reviewed the triage vital signs and the nursing notes.  Pertinent labs & imaging results that were available during my care of the patient were reviewed by me and considered in my medical decision making (see chart for details).     1:00 PM Patient resting comfortably.  Reviewed initial labs.  No anemia noted.  Strep pending.  Initial lactic slightly elevated.  IV fluids- plan recheck Patient appears well Likely d/c if lactic clears Repeat lactic acid <1 hgb 12- not c.w. 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

## 2017-12-21 NOTE — Discharge Instructions (Addendum)
Please arrange for primary care Drink plenty of fluids and use tylenol and ibuprofen as needed for pain

## 2017-12-21 NOTE — ED Notes (Signed)
Dr.Ray at bedside at this time.  

## 2017-12-21 NOTE — ED Triage Notes (Signed)
Pt reports hx of sickle cell. She reports she started having nasal congestion, ear pain, cough on Saturday. Yesterday she began experiencing bilateral thigh pain that is consistent with her crisis pain.

## 2017-12-23 LAB — CULTURE, GROUP A STREP (THRC)

## 2018-01-02 ENCOUNTER — Ambulatory Visit: Payer: Medicaid Other | Admitting: Family Medicine

## 2018-01-13 ENCOUNTER — Ambulatory Visit: Payer: Medicaid Other | Admitting: Family Medicine

## 2018-01-13 ENCOUNTER — Telehealth: Payer: Self-pay

## 2018-01-13 DIAGNOSIS — D57219 Sickle-cell/Hb-C disease with crisis, unspecified: Secondary | ICD-10-CM

## 2018-01-13 DIAGNOSIS — R110 Nausea: Secondary | ICD-10-CM

## 2018-01-13 MED ORDER — IBUPROFEN 800 MG PO TABS: 800.0000 mg | ORAL_TABLET | Freq: Three times a day (TID) | ORAL | 5 refills | Status: DC | PRN

## 2018-01-13 MED ORDER — ONDANSETRON HCL 4 MG PO TABS: 4.0000 mg | ORAL_TABLET | Freq: Three times a day (TID) | ORAL | 2 refills | Status: DC | PRN

## 2018-01-13 NOTE — Telephone Encounter (Signed)
Refill for ibuprofen and zofran sent into pharmacy. Thanks!

## 2018-01-18 ENCOUNTER — Ambulatory Visit: Payer: Medicaid Other | Admitting: Family Medicine

## 2018-02-28 ENCOUNTER — Other Ambulatory Visit: Payer: Self-pay

## 2018-02-28 ENCOUNTER — Encounter (HOSPITAL_COMMUNITY): Payer: Self-pay

## 2018-02-28 ENCOUNTER — Emergency Department (HOSPITAL_COMMUNITY)
Admission: EM | Admit: 2018-02-28 | Discharge: 2018-02-28 | Disposition: A | Discharge status: Home / Self Care | Payer: Medicaid Other | Attending: Emergency Medicine | Admitting: Emergency Medicine

## 2018-02-28 ENCOUNTER — Telehealth (HOSPITAL_COMMUNITY): Payer: Self-pay | Admitting: General Practice

## 2018-02-28 DIAGNOSIS — Z79899 Other long term (current) drug therapy: Secondary | ICD-10-CM | POA: Diagnosis not present

## 2018-02-28 DIAGNOSIS — M79661 Pain in right lower leg: Secondary | ICD-10-CM | POA: Diagnosis present

## 2018-02-28 DIAGNOSIS — D57219 Sickle-cell/Hb-C disease with crisis, unspecified: Secondary | ICD-10-CM | POA: Insufficient documentation

## 2018-02-28 DIAGNOSIS — Z87891 Personal history of nicotine dependence: Secondary | ICD-10-CM | POA: Diagnosis not present

## 2018-02-28 DIAGNOSIS — D5700 Hb-SS disease with crisis, unspecified: Secondary | ICD-10-CM

## 2018-02-28 LAB — COMPREHENSIVE METABOLIC PANEL
ALT: 16 U/L (ref 14–54)
AST: 22 U/L (ref 15–41)
Albumin: 3.6 g/dL (ref 3.5–5.0)
Alkaline Phosphatase: 53 U/L (ref 38–126)
Anion gap: 7 (ref 5–15)
BILIRUBIN TOTAL: 1 mg/dL (ref 0.3–1.2)
BUN: 6 mg/dL (ref 6–20)
CALCIUM: 9 mg/dL (ref 8.9–10.3)
CHLORIDE: 108 mmol/L (ref 101–111)
CO2: 24 mmol/L (ref 22–32)
CREATININE: 0.69 mg/dL (ref 0.44–1.00)
GFR calc Af Amer: >60 mL/min (ref >60)
Glucose, Bld: 100 mg/dL — ABNORMAL HIGH (ref 65–99)
Potassium: 3.5 mmol/L (ref 3.5–5.1)
Sodium: 139 mmol/L (ref 135–145)
TOTAL PROTEIN: 6.7 g/dL (ref 6.5–8.1)

## 2018-02-28 LAB — CBC WITH DIFFERENTIAL/PLATELET
BASOS ABS: 0.1 10*3/uL (ref 0.0–0.1)
BASOS PCT: 1 %
EOS ABS: 0.7 10*3/uL (ref 0.0–0.7)
EOS PCT: 11 %
HCT: 31.1 % — ABNORMAL LOW (ref 36.0–46.0)
HEMOGLOBIN: 11.2 g/dL — AB (ref 12.0–15.0)
LYMPHS ABS: 3.3 10*3/uL (ref 0.7–4.0)
Lymphocytes Relative: 47 %
MCH: 27.4 pg (ref 26.0–34.0)
MCHC: 36 g/dL (ref 30.0–36.0)
MCV: 76 fL — ABNORMAL LOW (ref 78.0–100.0)
Monocytes Absolute: 0.6 10*3/uL (ref 0.1–1.0)
Monocytes Relative: 9 %
Neutro Abs: 2.2 10*3/uL (ref 1.7–7.7)
Neutrophils Relative %: 32 %
PLATELETS: 289 10*3/uL (ref 150–400)
RBC: 4.09 MIL/uL (ref 3.87–5.11)
RDW: 17 % — ABNORMAL HIGH (ref 11.5–15.5)
WBC: 6.9 10*3/uL (ref 4.0–10.5)

## 2018-02-28 LAB — RETICULOCYTES
RBC.: 4.09 MIL/uL (ref 3.87–5.11)
RETIC CT PCT: 5.8 % — AB (ref 0.4–3.1)
Retic Count, Absolute: 237.2 10*3/uL — ABNORMAL HIGH (ref 19.0–186.0)

## 2018-02-28 LAB — I-STAT BETA HCG BLOOD, ED (MC, WL, AP ONLY)

## 2018-02-28 MED ORDER — HYDROMORPHONE HCL 1 MG/ML IJ SOLN: 0.5000 mg | INTRAMUSCULAR | Status: DC

## 2018-02-28 MED ORDER — ONDANSETRON HCL 4 MG/2ML IJ SOLN
4.0000 mg | INTRAMUSCULAR | Status: DC | PRN
  Administered 2018-02-28: 4 mg via INTRAVENOUS
  Filled 2018-02-28: qty 2

## 2018-02-28 MED ORDER — OXYCODONE-ACETAMINOPHEN 5-325 MG PO TABS
1.0000 | ORAL_TABLET | Freq: Once | ORAL | Status: AC
  Administered 2018-02-28: 1 via ORAL
  Filled 2018-02-28: qty 1

## 2018-02-28 MED ORDER — DIPHENHYDRAMINE HCL 25 MG PO CAPS
25.0000 mg | ORAL_CAPSULE | ORAL | Status: DC | PRN
  Administered 2018-02-28: 25 mg via ORAL
  Filled 2018-02-28: qty 1

## 2018-02-28 MED ORDER — HYDROMORPHONE HCL 1 MG/ML IJ SOLN: 2.0000 mg | INTRAMUSCULAR | Status: AC

## 2018-02-28 MED ORDER — HYDROMORPHONE HCL 1 MG/ML IJ SOLN: 1.0000 mg | INTRAMUSCULAR | Status: DC

## 2018-02-28 MED ORDER — HYDROMORPHONE HCL 1 MG/ML IJ SOLN: 2.0000 mg | INTRAMUSCULAR | Status: DC

## 2018-02-28 MED ORDER — HYDROMORPHONE HCL 1 MG/ML IJ SOLN
2.0000 mg | INTRAMUSCULAR | Status: DC
  Filled 2018-02-28: qty 2

## 2018-02-28 MED ORDER — HYDROMORPHONE HCL 1 MG/ML IJ SOLN
2.0000 mg | INTRAMUSCULAR | Status: AC
  Administered 2018-02-28: 2 mg via INTRAVENOUS
  Filled 2018-02-28: qty 2

## 2018-02-28 MED ORDER — SODIUM CHLORIDE 0.45 % IV SOLN
INTRAVENOUS | Status: DC
  Administered 2018-02-28: 13:00:00 via INTRAVENOUS

## 2018-02-28 MED ORDER — KETOROLAC TROMETHAMINE 30 MG/ML IJ SOLN
30.0000 mg | INTRAMUSCULAR | Status: AC
  Administered 2018-02-28: 30 mg via INTRAVENOUS
  Filled 2018-02-28: qty 1

## 2018-02-28 NOTE — Discharge Instructions (Signed)
Return to the ED with any concerns including difficulty breathing, chest pain, fever/chills, vomiting and not able to keep down liquids or medications, or any other alarming symptoms

## 2018-02-28 NOTE — ED Provider Notes (Signed)
[facility]ERGENC[facility] DEPARTMENT Provider Note   CSN: [id number] Arrival date & time: 02/28/18  1106     History   Chief Complaint Chief Complaint  Patient presents with  . Leg Pain  . Sickle Cell Pain Crisis    HPI Hannah Hawkins is a 32 y.o. female.  HPI  Patient with history of sickle cell anemia [place] disease presenting with right lower extremity pain.  This is similar pain to her prior sickle cell crises.  Symptoms began 2-3 days ago.  She has been taking her home medications and drinking increased fluids and resting without improvement.  She has had no fever.  No chest pain or abdominal pain.  No injury to her leg.  Pain is worse with palpation and movement.  There are no other associated systemic symptoms, there are no other alleviating or modifying factors.   Past Medical History:  Diagnosis Date  . Sickle cell anemia [facility])     Patient Active Problem List   Diagnosis Date Noted  . Anemia of chronic disease 11/13/2017  . Sinusitis, chronic 11/13/2017  . 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 EYE SURGERY  03/09/2017   left eye    OB History    No data available       Home Medications    Prior to Admission medications   Medication Sig Start Date End Date Taking? Authorizing Provider  ergocalciferol (VITAMIN D2) 50000 units capsule Take 1 capsule (50,000 Units total) by mouth once a week. Patient taking differently: Take 50,000 Units by mouth every Saturday.  02/22/17  Yes Dorena Dew, FNP  hydroxyurea (HYDREA) 500 MG capsule Take 500 mg by mouth daily. 02/07/18  Yes [provider]  ibuprofen (ADVIL,MOTRIN) 800 MG tablet Take 1 tablet (800 mg total) by mouth every 8 (eight) hours as needed for moderate pain. 01/13/18  Yes Dorena Dew, FNP  levorphanol (LEVODROMORAN) 2 MG tablet Take 2 mg by mouth 3 (three) times daily. 01/16/18  Yes [provider]    Multiple Vitamin (MULTIVITAMIN WITH MINERALS) TABS tablet Take 1 tablet by mouth daily.   Yes [provider]  naloxegol oxalate (MOVANTIK) 12.5 MG TABS tablet Take 1 tablet (12.5 mg total) by mouth daily. 08/04/17  Yes Dorena Dew, FNP  NARCAN 4 MG/0.1ML LIQD nasal spray kit Place 4 mg into the nose once as needed. overdose 12/22/17  Yes [provider]  ondansetron (ZOFRAN) 4 MG tablet Take 1 tablet (4 mg total) by mouth every 8 (eight) hours as needed for nausea or vomiting. 01/13/18  Yes Dorena Dew, FNP  cyclobenzaprine (FLEXERIL) 10 MG tablet Take 1 tablet (10 mg total) by mouth 3 (three) times daily as needed for muscle spasms. Patient not taking: Reported on 12/21/2017 12/10/17   Orpah Greek, MD  fluticasone [facility]) 50 MCG/ACT nasal spray Place 2 sprays into both nostrils daily. Patient not taking: Reported on 02/28/2018 11/17/17   Leana Gamer, MD  folic acid (FOLVITE) 1 MG tablet Take 1 tablet (1 mg total) by mouth daily. Patient not taking: Reported on 02/28/2018 11/11/17   Barton Dubois, MD  meclizine (ANTIVERT) 25 MG tablet Take 1 tablet (25 mg total) 3 (three) times daily as needed by mouth for dizziness. Patient not taking: Reported on 12/21/2017 10/31/17   Dorena Dew, FNP  Oxycodone HCl 10 MG TABS Take 1 tablet (10 mg total) by mouth every 6 (six)  hours as needed (pain). Patient not taking: Reported on 02/28/2018 12/10/17   Orpah Greek, MD  predniSONE (DELTASONE) 20 MG tablet Take 2 tablets (40 mg total) by mouth daily with breakfast. Patient not taking: Reported on 12/21/2017 12/10/17   Orpah Greek, MD    Family History History reviewed. No pertinent family history.  Social History Social History   Tobacco Use  . Smoking status: Former Research scientist (life sciences)  . Smokeless tobacco: Never Used  Substance Use Topics  . Alcohol use: Yes    Comment: Twice a month  . Drug use: No     Allergies   Penicillins   Review  of Systems Review of Systems  ROS reviewed and all otherwise negative except for mentioned in HPI   Physical Exam Updated Vital Signs BP (!) 156/143   Pulse 66   Temp 98.2 F (36.8 C) (Oral)   Resp 20   Ht '5\' 3"'  (1.6 m)   Wt 68 kg (150 lb)   LMP 02/21/2018   SpO2 99%   BMI 26.57 kg/m  Vitals reviewed Physical Exam  Physical Examination: General appearance - alert, well appearing, and in no distress Mental status - alert, oriented to person, place, and time Eyes - no conjunctival injection, no scleral icterus Mouth - mucous membranes moist, pharynx normal without lesions Chest - clear to auscultation, no wheezes, rales or rhonchi, symmetric air entry Heart - normal rate, regular rhythm, normal S1, S2, no murmurs, rubs, clicks or gallops Abdomen - soft, nontender, nondistended, no masses or organomegaly Neurological - alert, oriented, normal speech Musculoskeletal - no joint tenderness, deformity or swelling, ttp diffuse in right lower extremity Extremities - peripheral pulses normal, no pedal edema, no clubbing or cyanosis Skin - normal coloration and turgor, no rashes   ED Treatments / Results  Labs (all labs ordered are listed, but only abnormal results are displayed) Labs Reviewed  COMPREHENSIVE METABOLIC PANEL - Abnormal; Notable for the following components:      Result Value   Glucose, Bld 100 (*)    All other components within normal limits  CBC WITH DIFFERENTIAL/PLATELET - Abnormal; Notable for the following components:   Hemoglobin 11.2 (*)    HCT 31.1 (*)    MCV 76.0 (*)    RDW 17.0 (*)    All other components within normal limits  RETICULOCYTES - Abnormal; Notable for the following components:   Retic Ct Pct 5.8 (*)    Retic Count, Absolute 237.2 (*)    All other components within normal limits  I-STAT BETA HCG BLOOD, ED ([facility], [facility], [facility] ONLY)    EKG  EKG Interpretation None       Radiology No results found.  Procedures Procedures (including  critical care time)  Medications Ordered in ED Medications  0.45 % sodium chloride infusion ( Intravenous Stopped 02/28/18 1623)  diphenhydrAMINE (BENADRYL) capsule 25-50 mg (25 mg Oral Given 02/28/18 1332)  ondansetron (ZOFRAN) injection 4 mg (4 mg Intravenous Given 02/28/18 1332)  HYDROmorphone (DILAUDID) injection 2 mg (not administered)    Or  HYDROmorphone (DILAUDID) injection 2 mg (not administered)  ketorolac (TORADOL) 30 MG/ML injection 30 mg (30 mg Intravenous Given 02/28/18 1242)  HYDROmorphone (DILAUDID) injection 2 mg (2 mg Intravenous Given 02/28/18 1301)    Or  HYDROmorphone (DILAUDID) injection 2 mg ( Subcutaneous See Alternative 02/28/18 1301)  HYDROmorphone (DILAUDID) injection 2 mg (2 mg Intravenous Given 02/28/18 1333)    Or  HYDROmorphone (DILAUDID) injection 2 mg ( Subcutaneous See Alternative 02/28/18  1333)  HYDROmorphone (DILAUDID) injection 2 mg (2 mg Intravenous Given 02/28/18 1421)    Or  HYDROmorphone (DILAUDID) injection 2 mg ( Subcutaneous See Alternative 02/28/18 1421)  oxyCODONE-acetaminophen (PERCOCET/ROXICET) 5-325 MG per tablet 1 tablet (1 tablet Oral Given 02/28/18 1618)     Initial Impression / Assessment and Plan / ED Course  I have reviewed the triage vital signs and the nursing notes.  Pertinent labs & imaging results that were available during my care of the patient were reviewed by me and considered in my medical decision making (see chart for details).    1:53 PM on recheck pain is down to 8/10  Shunt presenting with complaint of right lower extremity pain which is similar to her prior sickle cell pain crises.  Her labs are reassuring.  She was treated with Toradol IV fluids Dilaudid.  Her pain improved and she requested discharged home.  Advised close followup with her PMD.  Discharged with strict return precautions.  Pt agreeable with plan.  Final Clinical Impressions(s) / ED Diagnoses   Final diagnoses:  Sickle cell pain crisis [facility])    ED  Discharge Orders    None       Pixie Casino, MD 02/28/18 [phone]

## 2018-02-28 NOTE — Telephone Encounter (Signed)
Patient called, complained of pain in the right leg, ankle, both feet are swollen, been dizzy for two days with some "stiffness in the neck as if I slept on the wrong side". She rated the leg pain as 9/10. Denied, fever, diarrhea, abdominal pain, some nausea/vomitted yesterday (took Zofran with not much relief). Last took Levorphanol 2 mg yesterday afternoon.. Provider notified.  Per provider, patient was told to go to the ER. Patient notified, expressed understanding and agreed to go to the ER.

## 2018-02-28 NOTE — ED Notes (Signed)
Patient given discharge instructions and verbalized understanding.  Patient stable to discharge at this time.  Patient is alert and oriented to baseline.  No distressed noted at this time.  All belongings taken with the patient at discharge.   

## 2018-02-28 NOTE — ED Triage Notes (Signed)
Onset yesterday bilateral leg pain, R > L.  Home pain meds not effective.

## 2018-03-05 ENCOUNTER — Other Ambulatory Visit: Payer: Self-pay

## 2018-03-05 ENCOUNTER — Emergency Department (HOSPITAL_COMMUNITY)
Admission: EM | Admit: 2018-03-05 | Discharge: 2018-03-05 | Disposition: A | Discharge status: Home / Self Care | Payer: Medicaid Other | Attending: Emergency Medicine | Admitting: Emergency Medicine

## 2018-03-05 ENCOUNTER — Encounter (HOSPITAL_COMMUNITY): Payer: Self-pay | Admitting: Emergency Medicine

## 2018-03-05 DIAGNOSIS — M542 Cervicalgia: Secondary | ICD-10-CM | POA: Diagnosis present

## 2018-03-05 DIAGNOSIS — Z79899 Other long term (current) drug therapy: Secondary | ICD-10-CM | POA: Insufficient documentation

## 2018-03-05 DIAGNOSIS — Z87891 Personal history of nicotine dependence: Secondary | ICD-10-CM | POA: Insufficient documentation

## 2018-03-05 DIAGNOSIS — M62838 Other muscle spasm: Secondary | ICD-10-CM | POA: Insufficient documentation

## 2018-03-05 MED ORDER — SODIUM CHLORIDE 0.9 % IV BOLUS (SEPSIS)
1000.0000 mL | Freq: Once | INTRAVENOUS | Status: AC
  Administered 2018-03-05: 1000 mL via INTRAVENOUS

## 2018-03-05 MED ORDER — KETOROLAC TROMETHAMINE 30 MG/ML IJ SOLN
30.0000 mg | Freq: Once | INTRAMUSCULAR | Status: AC
  Administered 2018-03-05: 30 mg via INTRAVENOUS
  Filled 2018-03-05: qty 1

## 2018-03-05 MED ORDER — LORAZEPAM 2 MG/ML IJ SOLN
1.0000 mg | Freq: Once | INTRAMUSCULAR | Status: AC
  Administered 2018-03-05: 1 mg via INTRAVENOUS
  Filled 2018-03-05: qty 1

## 2018-03-05 MED ORDER — METHOCARBAMOL 500 MG PO TABS: 500.0000 mg | ORAL_TABLET | Freq: Three times a day (TID) | ORAL | 0 refills | Status: DC | PRN

## 2018-03-05 MED ORDER — FENTANYL CITRATE (PF) 100 MCG/2ML IJ SOLN
100.0000 ug | Freq: Once | INTRAMUSCULAR | Status: AC
Start: 2018-03-05 — End: 2018-03-05
  Administered 2018-03-05: 100 ug via INTRAVENOUS
  Filled 2018-03-05: qty 2

## 2018-03-05 MED ORDER — CYCLOBENZAPRINE HCL 10 MG PO TABS
10.0000 mg | ORAL_TABLET | Freq: Once | ORAL | Status: AC
  Administered 2018-03-05: 10 mg via ORAL
  Filled 2018-03-05: qty 1

## 2018-03-05 NOTE — Discharge Instructions (Signed)
Take your home medications as prescribed.  You may take Robaxin up to 3 times daily as needed for muscle spasms.  She may make you drowsy so I typically only recommend taking it at night.  Do not drive, drink alcohol, or operate heavy machinery while on this medication.  Apply heat to the area, 20 minutes on 20 minutes off.  Take hot showers and hot baths in order to relax the muscles and do some gentle stretching exercises multiple times daily to help relax the muscle.  It may also help you to see a physical therapist for dry needling or a massage therapist. Follow up with your primary care physician as soon as possible for reevaluation and possible physical therapy referral. Return to the emergency department if any concerning signs or symptoms develop such as fevers, weakness, or passing out.

## 2018-03-05 NOTE — ED Provider Notes (Signed)
[facility] EMERGENCY DEPARTMENT Provider Note   CSN: [id number] Arrival date & time: 03/05/18  0403     History   Chief Complaint Chief Complaint  Patient presents with  . Neck Pain    HPI Hannah Hawkins is a 32 y.o. female with history of sickle cell anemia presents today for evaluation of acute onset, progressively worsening left-sided neck pain since Tuesday 5 days ago.  She states that she was seen and evaluated in the emergency department for sickle cell pain crisis but also mentioned this neck pain.  She was found to be safe for discharge home at that time.  She states that her symptoms began after sleeping on her stomach without a pillow.  Pain is sharp and stabbing, radiates from the left side of the neck to her shoulder.  Worsens with movement or rotation.  She states that she is unable to range her neck or talk very much due to pain.  She denies fevers.  No numbness and tingling of the arms.  Pain does not radiate down the arms.  She has tried levorphanol 2 mg yesterday as well as Lidoderm patch today and was given muscle relaxant in the ED waiting room without relief of her symptoms.  She states this does not feel like her usual sickle cell pain.  She denies any recent trauma or falls.  The history is provided by the patient.    Past Medical History:  Diagnosis Date  . Sickle cell anemia [facility])     Patient Active Problem List   Diagnosis Date Noted  . Anemia of chronic disease 11/13/2017  . Sinusitis, chronic 11/13/2017  . 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 EYE SURGERY  03/09/2017   left eye    OB History    No data available       Home Medications    Prior to Admission medications   Medication Sig Start Date End Date Taking? Authorizing Provider  ergocalciferol (VITAMIN D2) 50000 units capsule Take 1 capsule (50,000 Units total) by mouth once a week. Patient taking  differently: Take 50,000 Units by mouth every Saturday.  02/22/17   Dorena Dew, FNP  fluticasone (FLONASE) 50 MCG/ACT nasal spray Place 2 sprays into both nostrils daily. Patient not taking: Reported on 02/28/2018 11/17/17   Leana Gamer, MD  folic acid (FOLVITE) 1 MG tablet Take 1 tablet (1 mg total) by mouth daily. Patient not taking: Reported on 02/28/2018 11/11/17   Barton Dubois, MD  hydroxyurea (HYDREA) 500 MG capsule Take 500 mg by mouth daily. 02/07/18   [provider]  ibuprofen (ADVIL,MOTRIN) 800 MG tablet Take 1 tablet (800 mg total) by mouth every 8 (eight) hours as needed for moderate pain. 01/13/18   Dorena Dew, FNP  levorphanol (LEVODROMORAN) 2 MG tablet Take 2 mg by mouth 3 (three) times daily. 01/16/18   [provider]  meclizine (ANTIVERT) 25 MG tablet Take 1 tablet (25 mg total) 3 (three) times daily as needed by mouth for dizziness. Patient not taking: Reported on 12/21/2017 10/31/17   Dorena Dew, FNP  methocarbamol (ROBAXIN) 500 MG tablet Take 1 tablet (500 mg total) by mouth every 8 (eight) hours as needed for muscle spasms. 03/05/18   Ceilidh Torregrossa A, PA-C  Multiple Vitamin (MULTIVITAMIN WITH MINERALS) TABS tablet Take 1 tablet by mouth daily.    [provider]  naloxegol oxalate (MOVANTIK) 12.5 MG TABS tablet  Take 1 tablet (12.5 mg total) by mouth daily. 08/04/17   Dorena Dew, FNP  NARCAN 4 MG/0.1ML LIQD nasal spray kit Place 4 mg into the nose once as needed. overdose 12/22/17   [provider]  ondansetron (ZOFRAN) 4 MG tablet Take 1 tablet (4 mg total) by mouth every 8 (eight) hours as needed for nausea or vomiting. 01/13/18   Dorena Dew, FNP  Oxycodone HCl 10 MG TABS Take 1 tablet (10 mg total) by mouth every 6 (six) hours as needed (pain). Patient not taking: Reported on 02/28/2018 12/10/17   Orpah Greek, MD  predniSONE (DELTASONE) 20 MG tablet Take 2 tablets (40 mg total) by mouth daily with  breakfast. Patient not taking: Reported on 12/21/2017 12/10/17   Orpah Greek, MD    Family History No family history on file.  Social History Social History   Tobacco Use  . Smoking status: Former Research scientist (life sciences)  . Smokeless tobacco: Never Used  Substance Use Topics  . Alcohol use: Yes    Comment: Twice a month  . Drug use: No     Allergies   Penicillins   Review of Systems Review of Systems  Constitutional: Negative for chills and fever.  Musculoskeletal: Positive for neck pain.  Neurological: Negative for syncope, weakness and numbness.     Physical Exam Updated Vital Signs BP 114/78 (BP Location: Left Arm)   Pulse 78   Temp 98.4 F (36.9 C) (Oral)   Resp 16   Ht 5' 3" (1.6 m)   Wt 68 kg (150 lb)   LMP 02/21/2018   SpO2 98%   BMI 26.57 kg/m   Physical Exam  Constitutional: She is oriented to person, place, and time. She appears well-developed and well-nourished. No distress.  Patient resting in chair, screaming at regular intervals  HENT:  Head: Normocephalic and atraumatic.  Eyes: Conjunctivae are normal. Right eye exhibits no discharge. Left eye exhibits no discharge.  Neck: Neck supple. No JVD present. No tracheal deviation present.  No midline spine TTP, diffuse left sided paracervical muscle tenderness extending from the insertion point of the splenius capitis and overlying the trapezius with appreciable muscle spasm, no deformity, crepitus, or step-off noted.  Decreased range of motion secondary to pain.  No tenderness to palpation of the neck anteriorly.  No swelling noted.   Cardiovascular: Normal rate.  Pulmonary/Chest: Effort normal.  Abdominal: She exhibits no distension.  Musculoskeletal: She exhibits no edema.  5/5 strength of BUE major muscle groups despite pain.  Somewhat decreased range of motion of the shoulders bilaterally secondary to neck pain.  Lymphadenopathy:    She has no cervical adenopathy.  Neurological: She is alert and  oriented to person, place, and time. No cranial nerve deficit or sensory deficit. She exhibits normal muscle tone.  Fluent speech, no facial droop, sensation intact to soft touch of extremities.  Good grip strength bilaterally  Skin: Skin is warm and dry. No erythema.  Psychiatric: She has a normal mood and affect. Her behavior is normal.  Nursing note and vitals reviewed.    ED Treatments / Results  Labs (all labs ordered are listed, but only abnormal results are displayed) Labs Reviewed - No data to display  EKG  EKG Interpretation None       Radiology No results found.  Procedures Procedures (including critical care time)  Medications Ordered in ED Medications  fentaNYL (SUBLIMAZE) injection 100 mcg (not administered)  cyclobenzaprine (FLEXERIL) tablet 10 mg (10 mg Oral  Given 03/05/18 0510)  LORazepam (ATIVAN) injection 1 mg (1 mg Intravenous Given 03/05/18 0946)  ketorolac (TORADOL) 30 MG/ML injection 30 mg (30 mg Intravenous Given 03/05/18 0946)  sodium chloride 0.9 % bolus 1,000 mL (1,000 mLs Intravenous New Bag/Given 03/05/18 0945)  fentaNYL (SUBLIMAZE) injection 100 mcg (100 mcg Intravenous Given 03/05/18 0945)     Initial Impression / Assessment and Plan / ED Course  I have reviewed the triage vital signs and the nursing notes.  Pertinent labs & imaging results that were available during my care of the patient were reviewed by me and considered in my medical decision making (see chart for details).     Patient presents with neck pain and appreciable muscle spasm on the left side.  No midline spine tenderness.  No fever to suggest meningitis.  She does have limited range of motion secondary to pain and appears to be in a great deal of discomfort.  She is neurovascularly intact and ambulatory without difficulty however.  I do not suspect cervical spine injury in the absence of trauma.  Suspect her chronic pain medication use is contributing to hyperesthesia.  She was  given fluids, Toradol, IV Ativan, and fentanyl and on reevaluation she states she is feeling a little bit better but would feel better with another dose of pain medication.  She is ranging her neck more when encouraged but at rest keeps her neck in a stiff  Position leaning to the right.  I explained to her the utility of NSAIDs, heat, and gentle stretching as well as massage therapy or physical therapy with dry needling.  She will follow-up with her primary care physician for reevaluation of her symptoms and possible medication management.  We will discontinue her Flexeril as it has not been effective to her and she will try Robaxin.  Discussed that this medication may have a sedating effect and advised of proper use.  Discussed indications for return to the ED. Pt verbalized understanding of and agreement with plan and is safe for discharge home at this time.   Final Clinical Impressions(s) / ED Diagnoses   Final diagnoses:  Muscle spasms of neck  Neck pain    ED Discharge Orders        Ordered    methocarbamol (ROBAXIN) 500 MG tablet  Every 8 hours PRN     03/05/18 1038       , [place] A, PA-C 03/05/18 1142    Julianne Rice, MD 03/06/18 [phone]

## 2018-03-05 NOTE — ED Notes (Signed)
Gave pt cranberry juice and graham crackers, per Asher MuirJamie - RN.

## 2018-03-05 NOTE — ED Triage Notes (Signed)
Woke up yesterday with pain in left side of neck.  Reports that it is worse today.  Took Levorphanol 2mg  yesterday.  (Hx of sickle cell).  Also has a lidoderm patch in place.  Reports no decrease in pain with that either.

## 2018-03-07 ENCOUNTER — Other Ambulatory Visit: Payer: Self-pay

## 2018-03-07 ENCOUNTER — Emergency Department (HOSPITAL_COMMUNITY)
Admission: EM | Admit: 2018-03-07 | Discharge: 2018-03-08 | Disposition: A | Discharge status: Home / Self Care | Payer: Medicaid Other | Attending: Emergency Medicine | Admitting: Emergency Medicine

## 2018-03-07 ENCOUNTER — Emergency Department (HOSPITAL_COMMUNITY): Payer: Medicaid Other

## 2018-03-07 ENCOUNTER — Encounter (HOSPITAL_COMMUNITY): Payer: Self-pay | Admitting: Emergency Medicine

## 2018-03-07 DIAGNOSIS — Y929 Unspecified place or not applicable: Secondary | ICD-10-CM | POA: Insufficient documentation

## 2018-03-07 DIAGNOSIS — S199XXA Unspecified injury of neck, initial encounter: Secondary | ICD-10-CM | POA: Diagnosis present

## 2018-03-07 DIAGNOSIS — D5700 Hb-SS disease with crisis, unspecified: Secondary | ICD-10-CM

## 2018-03-07 DIAGNOSIS — S161XXA Strain of muscle, fascia and tendon at neck level, initial encounter: Secondary | ICD-10-CM

## 2018-03-07 DIAGNOSIS — Z87891 Personal history of nicotine dependence: Secondary | ICD-10-CM | POA: Insufficient documentation

## 2018-03-07 DIAGNOSIS — Y939 Activity, unspecified: Secondary | ICD-10-CM | POA: Diagnosis not present

## 2018-03-07 DIAGNOSIS — Z79899 Other long term (current) drug therapy: Secondary | ICD-10-CM | POA: Diagnosis not present

## 2018-03-07 DIAGNOSIS — Y999 Unspecified external cause status: Secondary | ICD-10-CM | POA: Insufficient documentation

## 2018-03-07 DIAGNOSIS — X509XXA Other and unspecified overexertion or strenuous movements or postures, initial encounter: Secondary | ICD-10-CM | POA: Diagnosis not present

## 2018-03-07 LAB — CBC WITH DIFFERENTIAL/PLATELET
Basophils Absolute: 0.1 10*3/uL (ref 0.0–0.1)
Basophils Relative: 1 %
Eosinophils Absolute: 0.4 10*3/uL (ref 0.0–0.7)
Eosinophils Relative: 5 %
HCT: 32.7 % — ABNORMAL LOW (ref 36.0–46.0)
Hemoglobin: 12 g/dL (ref 12.0–15.0)
Lymphocytes Relative: 46 %
Lymphs Abs: 4.3 10*3/uL — ABNORMAL HIGH (ref 0.7–4.0)
MCH: 27.6 pg (ref 26.0–34.0)
MCHC: 36.7 g/dL — ABNORMAL HIGH (ref 30.0–36.0)
MCV: 75.3 fL — ABNORMAL LOW (ref 78.0–100.0)
Monocytes Absolute: 0.7 10*3/uL (ref 0.1–1.0)
Monocytes Relative: 7 %
Neutro Abs: 3.8 10*3/uL (ref 1.7–7.7)
Neutrophils Relative %: 41 %
Platelets: 385 10*3/uL (ref 150–400)
RBC: 4.34 MIL/uL (ref 3.87–5.11)
RDW: 15.8 % — ABNORMAL HIGH (ref 11.5–15.5)
WBC: 9.2 10*3/uL (ref 4.0–10.5)

## 2018-03-07 LAB — COMPREHENSIVE METABOLIC PANEL
ALT: 14 U/L (ref 14–54)
AST: 22 U/L (ref 15–41)
Albumin: 4.2 g/dL (ref 3.5–5.0)
Alkaline Phosphatase: 53 U/L (ref 38–126)
Anion gap: 8 (ref 5–15)
BUN: 7 mg/dL (ref 6–20)
CO2: 28 mmol/L (ref 22–32)
Calcium: 9.3 mg/dL (ref 8.9–10.3)
Chloride: 101 mmol/L (ref 101–111)
Creatinine, Ser: 0.71 mg/dL (ref 0.44–1.00)
GFR calc Af Amer: >60 mL/min (ref >60)
GFR calc non Af Amer: >60 mL/min (ref >60)
Glucose, Bld: 105 mg/dL — ABNORMAL HIGH (ref 65–99)
Potassium: 3.6 mmol/L (ref 3.5–5.1)
Sodium: 137 mmol/L (ref 135–145)
Total Bilirubin: 0.9 mg/dL (ref 0.3–1.2)
Total Protein: 7.7 g/dL (ref 6.5–8.1)

## 2018-03-07 LAB — I-STAT BETA HCG BLOOD, ED (MC, WL, AP ONLY): I-stat hCG, quantitative: <5 m[IU]/mL (ref <5)

## 2018-03-07 LAB — RETICULOCYTES
RBC.: 4.34 MIL/uL (ref 3.87–5.11)
Retic Count, Absolute: 134.5 10*3/uL (ref 19.0–186.0)
Retic Ct Pct: 3.1 % (ref 0.4–3.1)

## 2018-03-07 MED ORDER — HYDROMORPHONE HCL 1 MG/ML IJ SOLN
2.0000 mg | Freq: Once | INTRAMUSCULAR | Status: AC
  Administered 2018-03-07: 2 mg via INTRAVENOUS
  Filled 2018-03-07 (×2): qty 2

## 2018-03-07 MED ORDER — DIPHENHYDRAMINE HCL 50 MG/ML IJ SOLN
25.0000 mg | Freq: Once | INTRAMUSCULAR | Status: AC
  Administered 2018-03-07: 25 mg via INTRAVENOUS
  Filled 2018-03-07: qty 1

## 2018-03-07 MED ORDER — ONDANSETRON HCL 4 MG/2ML IJ SOLN
4.0000 mg | Freq: Once | INTRAMUSCULAR | Status: AC
  Administered 2018-03-07: 4 mg via INTRAVENOUS
  Filled 2018-03-07: qty 2

## 2018-03-07 MED ORDER — HYDROMORPHONE HCL 1 MG/ML IJ SOLN
2.0000 mg | Freq: Once | INTRAMUSCULAR | Status: AC
  Administered 2018-03-08: 2 mg via INTRAVENOUS
  Filled 2018-03-07: qty 2

## 2018-03-07 MED ORDER — DIAZEPAM 5 MG PO TABS
5.0000 mg | ORAL_TABLET | Freq: Once | ORAL | Status: AC
  Administered 2018-03-07: 5 mg via ORAL
  Filled 2018-03-07: qty 1

## 2018-03-07 MED ORDER — BUPIVACAINE HCL (PF) 0.5 % IJ SOLN
10.0000 mL | Freq: Once | INTRAMUSCULAR | Status: AC
  Administered 2018-03-07: 10 mL
  Filled 2018-03-07: qty 10

## 2018-03-07 MED ORDER — HYDROMORPHONE HCL 1 MG/ML IJ SOLN
2.0000 mg | Freq: Once | INTRAMUSCULAR | Status: AC
  Administered 2018-03-07: 2 mg via INTRAVENOUS
  Filled 2018-03-07: qty 2

## 2018-03-07 MED ORDER — CYCLOBENZAPRINE HCL 10 MG PO TABS: 10.0000 mg | ORAL_TABLET | Freq: Two times a day (BID) | ORAL | 0 refills | Status: DC | PRN

## 2018-03-07 NOTE — ED Triage Notes (Signed)
Pt also c/o Migraine pain in head

## 2018-03-07 NOTE — ED Provider Notes (Signed)
[place] EMERGENCY DEPARTMENT Provider Note   CSN: [id number] Arrival date & time: 03/07/18  1537     History   Chief Complaint Chief Complaint  Patient presents with  . Sickle Cell Pain Crisis    HPI Hannah Hawkins is a 32 y.o. female.  The history is provided by the patient and medical records. No language interpreter was used.    Hannah Hawkins is a 32 y.o. female  with a PMH of sickle cell who presents to the Emergency Department complaining of left-sided neck pain for about 5 days now.  She was seen in the ER 2 days ago for same.  She reports some improvement initially with muscle relaxant prescribed and ibuprofen, however pain still present and affecting her quality of life.  She reports that she is having difficulty sleeping, pain throughout the day and now starting to have posterior headaches which she attributes to her neck pain.  She is also experiencing low back pain and right leg pain which she believes is the start of a sickle cell crisis.  She attributes onset of crisis due to stress from her neck pain.  Along with ibuprofen and muscle relaxants, she has also tried heat to the area with little improvement.  She feels as if her range of motion has improved in the neck as compared to 2 days ago, but still limited.  No fever or chills.  She believes that she may have slept wrong on the neck initially, but no trauma.  No extremity numbness or weakness.   Past Medical History:  Diagnosis Date  . Sickle cell anemia [facility])     Patient Active Problem List   Diagnosis Date Noted  . Anemia of chronic disease 11/13/2017  . Sinusitis, chronic 11/13/2017  . 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 EYE SURGERY  03/09/2017   left eye    OB History    No data available       Home Medications    Prior to Admission medications   Medication Sig Start Date End Date Taking? Authorizing  Provider  ergocalciferol (VITAMIN D2) 50000 units capsule Take 1 capsule (50,000 Units total) by mouth once a week. Patient taking differently: Take 50,000 Units by mouth every Saturday.  02/22/17  Yes Dorena Dew, FNP  hydroxyurea (HYDREA) 500 MG capsule Take 500 mg by mouth daily. 02/07/18  Yes [provider]  ibuprofen (ADVIL,MOTRIN) 800 MG tablet Take 1 tablet (800 mg total) by mouth every 8 (eight) hours as needed for moderate pain. 01/13/18  Yes Dorena Dew, FNP  levorphanol (LEVODROMORAN) 2 MG tablet Take 2 mg by mouth 3 (three) times daily. 01/16/18  Yes [provider]  methocarbamol (ROBAXIN) 500 MG tablet Take 1 tablet (500 mg total) by mouth every 8 (eight) hours as needed for muscle spasms. 03/05/18  Yes Fawze, Mina A, PA-C  Multiple Vitamin (MULTIVITAMIN WITH MINERALS) TABS tablet Take 1 tablet by mouth daily.   Yes [provider]  naloxegol oxalate (MOVANTIK) 12.5 MG TABS tablet Take 1 tablet (12.5 mg total) by mouth daily. 08/04/17  Yes Dorena Dew, FNP  NARCAN 4 MG/0.1ML LIQD nasal spray kit Place 4 mg into the nose once as needed. overdose 12/22/17  Yes [provider]  ondansetron (ZOFRAN) 4 MG tablet Take 1 tablet (4 mg total) by mouth every 8 (eight) hours as needed for nausea or vomiting. 01/13/18  Yes Cammie Sickle  M, FNP  cyclobenzaprine (FLEXERIL) 10 MG tablet Take 1 tablet (10 mg total) by mouth 2 (two) times daily as needed for muscle spasms. 03/07/18   Jonathan Kirkendoll, Ozella Almond, PA-C  fluticasone (FLONASE) 50 MCG/ACT nasal spray Place 2 sprays into both nostrils daily. Patient not taking: Reported on 02/28/2018 11/17/17   Leana Gamer, MD  folic acid (FOLVITE) 1 MG tablet Take 1 tablet (1 mg total) by mouth daily. Patient not taking: Reported on 02/28/2018 11/11/17   Barton Dubois, MD  meclizine (ANTIVERT) 25 MG tablet Take 1 tablet (25 mg total) 3 (three) times daily as needed by mouth for dizziness. Patient not taking:  Reported on 12/21/2017 10/31/17   Dorena Dew, FNP  Oxycodone HCl 10 MG TABS Take 1 tablet (10 mg total) by mouth every 6 (six) hours as needed (pain). Patient not taking: Reported on 02/28/2018 12/10/17   Orpah Greek, MD  predniSONE (DELTASONE) 20 MG tablet Take 2 tablets (40 mg total) by mouth daily with breakfast. Patient not taking: Reported on 12/21/2017 12/10/17   Orpah Greek, MD    Family History No family history on file.  Social History Social History   Tobacco Use  . Smoking status: Former Research scientist (life sciences)  . Smokeless tobacco: Never Used  Substance Use Topics  . Alcohol use: Yes    Comment: Twice a month  . Drug use: No     Allergies   Penicillins   Review of Systems Review of Systems  Musculoskeletal: Positive for arthralgias, myalgias and neck pain.  All other systems reviewed and are negative.    Physical Exam Updated Vital Signs BP 111/79   Pulse 79   Temp 98.1 F (36.7 C) (Oral)   Resp 18   LMP 02/21/2018   SpO2 97%   Physical Exam  Constitutional: She is oriented to person, place, and time. She appears well-developed and well-nourished. No distress.  HENT:  Head: Normocephalic and atraumatic.  Neck:  Tenderness to palpation to the left paraspinal musculature with visible spasm noted. No midline tenderness. No meningeal signs.   Cardiovascular: Normal rate, regular rhythm and normal heart sounds.  No murmur heard. Pulmonary/Chest: Effort normal and breath sounds normal. No respiratory distress.  Abdominal: Soft. She exhibits no distension. There is no tenderness.  Musculoskeletal:  Diffuse tenderness to right lumbar paraspinal musculature and right hip.  Full range of motion without any difficulty.  5/5 muscle strength.  No overlying skin changes.  Neurological: She is alert and oriented to person, place, and time.   All 4 extremities neurovascularly intact.  Skin: Skin is warm and dry.  Nursing note and vitals  reviewed.    ED Treatments / Results  Labs (all labs ordered are listed, but only abnormal results are displayed) Labs Reviewed  COMPREHENSIVE METABOLIC PANEL - Abnormal; Notable for the following components:      Result Value   Glucose, Bld 105 (*)    All other components within normal limits  CBC WITH DIFFERENTIAL/PLATELET - Abnormal; Notable for the following components:   HCT 32.7 (*)    MCV 75.3 (*)    MCHC 36.7 (*)    RDW 15.8 (*)    Lymphs Abs 4.3 (*)    All other components within normal limits  RETICULOCYTES  I-STAT BETA HCG BLOOD, ED ([facility], [facility], [facility] ONLY)    EKG  EKG Interpretation None       Radiology Dg Chest 2 View  Result Date: 03/07/2018 CLINICAL DATA:  Sickle cell crisis,  back pain, RIGHT leg pain, neck pain and swelling EXAM: CHEST - 2 VIEW COMPARISON:  12/21/2017 FINDINGS: Normal heart size, mediastinal contours, and pulmonary vascularity. Lungs clear. No pleural effusion or pneumothorax. Bones unremarkable. IMPRESSION: Normal exam. Electronically Signed   By: Lavonia Dana M.D.   On: 03/07/2018 16:40    Procedures Procedures (including critical care time)  TRIGGER POINT INJECTION PROCEDURE NOTE Procedure authorized and performed by: Ozella Almond Waylon Koffler  Patient given 0.5% Marcaine injection in trapezius and splenius capitis trigger points. Patient consents verbally to this.   Procedure: With alcohol prep of the left neck and trapezius musculature.  Palpable firm area of tenderness consistent with trigger point is localized. It was approached with 27-gauge needle and 1 mL of 0.5% Marcaine was injected into both locations. Tolerated this well with immediate pain relief and improvement in range of motion.    Medications Ordered in ED Medications  HYDROmorphone (DILAUDID) injection 2 mg (not administered)  HYDROmorphone (DILAUDID) injection 2 mg (2 mg Intravenous Given 03/07/18 2013)  diazepam (VALIUM) tablet 5 mg (5 mg Oral Given 03/07/18 2007)  bupivacaine  (MARCAINE) 0.5 % injection 10 mL (10 mLs Infiltration Given 03/07/18 2008)  ondansetron (ZOFRAN) injection 4 mg (4 mg Intravenous Given 03/07/18 2154)  diphenhydrAMINE (BENADRYL) injection 25 mg (25 mg Intravenous Given 03/07/18 2154)  HYDROmorphone (DILAUDID) injection 2 mg (2 mg Intravenous Given 03/07/18 2156)     Initial Impression / Assessment and Plan / ED Course  I have reviewed the triage vital signs and the nursing notes.  Pertinent labs & imaging results that were available during my care of the patient were reviewed by me and considered in my medical decision making (see chart for details).    Lakysha Kossman is a 32 y.o. female who presents to ED for sickle cell pain to back and leg as well as muscle spasm to the left side of the neck. She was seen for muscle spasm two days ago and started on muscle relaxants with no improvement. Trigger point injections performed by me as dictated above. Patient immediately had improvement in pain and range of motion of the neck. Labs baseline. Patient re-evaluated and feels much improved. Comfortable with discharge to home. Understands to follow up with PCP and reasons to return to ER. All questions answered.  Patient discussed with Dr. Wilson Singer who agrees with treatment plan.    Final Clinical Impressions(s) / ED Diagnoses   Final diagnoses:  Strain of neck muscle, initial encounter  Sickle cell anemia with pain [facility])    ED Discharge Orders        Ordered    cyclobenzaprine (FLEXERIL) 10 MG tablet  2 times daily PRN     03/07/18 2357       Gery Sabedra, Ozella Almond, PA-C 03/08/18 8719    Virgel Manifold, MD 03/08/18 1452

## 2018-03-07 NOTE — Discharge Instructions (Signed)
Follow up with your primary care doctor. Return to ER for new or worsening symptoms, any additional concerns.

## 2018-03-07 NOTE — ED Triage Notes (Signed)
Pt c/o sickle cell crisis, back pain, right leg pain, neck swelling-- neck pain causes sharp stabbing pain down back.
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
## 2018-04-03 ENCOUNTER — Emergency Department (HOSPITAL_COMMUNITY)
Admission: EM | Admit: 2018-04-03 | Discharge: 2018-04-04 | Disposition: A | Discharge status: Home / Self Care | Payer: Medicaid Other | Attending: Emergency Medicine | Admitting: Emergency Medicine

## 2018-04-03 ENCOUNTER — Other Ambulatory Visit: Payer: Self-pay

## 2018-04-03 ENCOUNTER — Encounter (HOSPITAL_COMMUNITY): Payer: Self-pay | Admitting: *Deleted

## 2018-04-03 DIAGNOSIS — E876 Hypokalemia: Secondary | ICD-10-CM | POA: Diagnosis not present

## 2018-04-03 DIAGNOSIS — Z87891 Personal history of nicotine dependence: Secondary | ICD-10-CM | POA: Diagnosis not present

## 2018-04-03 DIAGNOSIS — Z862 Personal history of diseases of the blood and blood-forming organs and certain disorders involving the immune mechanism: Secondary | ICD-10-CM | POA: Diagnosis not present

## 2018-04-03 DIAGNOSIS — J069 Acute upper respiratory infection, unspecified: Secondary | ICD-10-CM | POA: Diagnosis not present

## 2018-04-03 DIAGNOSIS — M791 Myalgia, unspecified site: Secondary | ICD-10-CM

## 2018-04-03 DIAGNOSIS — Z79899 Other long term (current) drug therapy: Secondary | ICD-10-CM | POA: Insufficient documentation

## 2018-04-03 LAB — COMPREHENSIVE METABOLIC PANEL
ALT: 13 U/L — AB (ref 14–54)
AST: 20 U/L (ref 15–41)
Albumin: 3.8 g/dL (ref 3.5–5.0)
Alkaline Phosphatase: 51 U/L (ref 38–126)
Anion gap: 10 (ref 5–15)
BUN: 7 mg/dL (ref 6–20)
CHLORIDE: 102 mmol/L (ref 101–111)
CO2: 26 mmol/L (ref 22–32)
CREATININE: 0.71 mg/dL (ref 0.44–1.00)
Calcium: 8.8 mg/dL — ABNORMAL LOW (ref 8.9–10.3)
Glucose, Bld: 114 mg/dL — ABNORMAL HIGH (ref 65–99)
Potassium: 3.3 mmol/L — ABNORMAL LOW (ref 3.5–5.1)
Sodium: 138 mmol/L (ref 135–145)
Total Bilirubin: 0.6 mg/dL (ref 0.3–1.2)
Total Protein: 6.8 g/dL (ref 6.5–8.1)

## 2018-04-03 LAB — I-STAT BETA HCG BLOOD, ED (MC, WL, AP ONLY)

## 2018-04-03 NOTE — ED Triage Notes (Addendum)
Pt started having generalized body aches yesterday and mouth pain today. Reports body aches are consistent with sickle cell crisis, mouth pain is new. Also reports sore throat x 2 weeks worse when yawning

## 2018-04-04 ENCOUNTER — Emergency Department (HOSPITAL_COMMUNITY): Payer: Medicaid Other

## 2018-04-04 LAB — CBC WITH DIFFERENTIAL/PLATELET
BASOS ABS: 0 10*3/uL (ref 0.0–0.1)
Basophils Relative: 0 %
EOS PCT: 11 %
Eosinophils Absolute: 0.9 10*3/uL — ABNORMAL HIGH (ref 0.0–0.7)
HEMATOCRIT: 33 % — AB (ref 36.0–46.0)
Hemoglobin: 12.2 g/dL (ref 12.0–15.0)
LYMPHS ABS: 2.9 10*3/uL (ref 0.7–4.0)
Lymphocytes Relative: 35 %
MCH: 27.8 pg (ref 26.0–34.0)
MCHC: 37 g/dL — ABNORMAL HIGH (ref 30.0–36.0)
MCV: 75.2 fL — AB (ref 78.0–100.0)
MONO ABS: 0.4 10*3/uL (ref 0.1–1.0)
Monocytes Relative: 5 %
NEUTROS PCT: 49 %
Neutro Abs: 4.1 10*3/uL (ref 1.7–7.7)
Platelets: 311 10*3/uL (ref 150–400)
RBC: 4.39 MIL/uL (ref 3.87–5.11)
RDW: 15.2 % (ref 11.5–15.5)
WBC: 8.3 10*3/uL (ref 4.0–10.5)

## 2018-04-04 LAB — RETICULOCYTES
RBC.: 4.39 MIL/uL (ref 3.87–5.11)
RETIC COUNT ABSOLUTE: 188.8 10*3/uL — AB (ref 19.0–186.0)
Retic Ct Pct: 4.3 % — ABNORMAL HIGH (ref 0.4–3.1)

## 2018-04-04 MED ORDER — KETOROLAC TROMETHAMINE 30 MG/ML IJ SOLN
30.0000 mg | Freq: Once | INTRAMUSCULAR | Status: AC
Start: 2018-04-04 — End: 2018-04-04
  Administered 2018-04-04: 30 mg via INTRAMUSCULAR
  Filled 2018-04-04: qty 1

## 2018-04-04 MED ORDER — POTASSIUM CHLORIDE CRYS ER 20 MEQ PO TBCR
40.0000 meq | EXTENDED_RELEASE_TABLET | Freq: Once | ORAL | Status: AC
  Administered 2018-04-04: 40 meq via ORAL
  Filled 2018-04-04: qty 2

## 2018-04-04 MED ORDER — ONDANSETRON HCL 4 MG/2ML IJ SOLN
4.0000 mg | Freq: Once | INTRAMUSCULAR | Status: AC
  Administered 2018-04-04: 4 mg via INTRAVENOUS
  Filled 2018-04-04: qty 2

## 2018-04-04 MED ORDER — HYDROMORPHONE HCL 2 MG/ML IJ SOLN
1.0000 mg | Freq: Once | INTRAMUSCULAR | Status: AC
  Administered 2018-04-04: 1 mg via INTRAMUSCULAR
  Filled 2018-04-04: qty 1

## 2018-04-04 NOTE — ED Notes (Signed)
Pt states her  Young daughter had the flu last week and  Now she feels like her whole body aches and her throat is sore states feels like sickle cell pain aching is so bad ,

## 2018-04-04 NOTE — ED Notes (Signed)
Pt states feels better.

## 2018-04-04 NOTE — Discharge Instructions (Signed)
It was our pleasure to provide your ER care today - we hope that you feel better.  Rest. Drink plenty of fluids.  Take acetaminophen and/or ibuprofen as need for pain.  Contact your primary care doctor should you require additional pain medication.  Your potassium level is mildly low (3.3) - eat plenty of fruits and vegetables, and follow up with primary care doctor.   Follow up with primary care doctor in the coming week for recheck.  Return to ER if worse, new symptoms, fevers, trouble breathing, other concern.  You were given pain medication in the ER - no driving for the next 6 hours.

## 2018-04-04 NOTE — ED Notes (Signed)
Pt refuses vital signs recheck.

## 2018-04-04 NOTE — ED Notes (Signed)
Pt aware of waiting status

## 2018-04-04 NOTE — ED Provider Notes (Signed)
[place] EMERGENCY DEPARTMENT Provider Note   CSN: [id number] Arrival date & time: 04/03/18  2156     History   Chief Complaint Chief Complaint  Patient presents with  . Sickle Cell Pain Crisis    HPI Hannah Hawkins is a 32 y.o. female.  Patient with hx sickle cell anemia, c/o all over body pain for the past several days. Pain is moderate, persistent, constant, non radiating. Pt also notes recent non prod cough, nasal congestion. Pts daughter with flu last week.  No fever or chills. No sob. No chest pain. Pt indicates out of pain meds at home, and states she needs to make appt to f/u at sickle cell clinic.   The history is provided by the patient.  Sickle Cell Pain Crisis  Associated symptoms: congestion and cough   Associated symptoms: no chest pain, no fever, no headaches, no shortness of breath and no vomiting     Past Medical History:  Diagnosis Date  . Sickle cell anemia [facility])     Patient Active Problem List   Diagnosis Date Noted  . Anemia of chronic disease 11/13/2017  . Sinusitis, chronic 11/13/2017  . 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 EYE SURGERY  03/09/2017   left eye     OB History   None      Home Medications    Prior to Admission medications   Medication Sig Start Date End Date Taking? Authorizing Provider  cyclobenzaprine (FLEXERIL) 10 MG tablet Take 1 tablet (10 mg total) by mouth 2 (two) times daily as needed for muscle spasms. 03/07/18   Ward, Ozella Almond, PA-C  ergocalciferol (VITAMIN D2) 50000 units capsule Take 1 capsule (50,000 Units total) by mouth once a week. Patient taking differently: Take 50,000 Units by mouth every Saturday.  02/22/17   Dorena Dew, FNP  fluticasone (FLONASE) 50 MCG/ACT nasal spray Place 2 sprays into both nostrils daily. Patient not taking: Reported on 02/28/2018 11/17/17   Leana Gamer, MD  folic acid (FOLVITE) 1  MG tablet Take 1 tablet (1 mg total) by mouth daily. Patient not taking: Reported on 02/28/2018 11/11/17   Barton Dubois, MD  hydroxyurea (HYDREA) 500 MG capsule Take 500 mg by mouth daily. 02/07/18   [provider]  ibuprofen (ADVIL,MOTRIN) 800 MG tablet Take 1 tablet (800 mg total) by mouth every 8 (eight) hours as needed for moderate pain. 01/13/18   Dorena Dew, FNP  levorphanol (LEVODROMORAN) 2 MG tablet Take 2 mg by mouth 3 (three) times daily. 01/16/18   [provider]  meclizine (ANTIVERT) 25 MG tablet Take 1 tablet (25 mg total) 3 (three) times daily as needed by mouth for dizziness. Patient not taking: Reported on 12/21/2017 10/31/17   Dorena Dew, FNP  methocarbamol (ROBAXIN) 500 MG tablet Take 1 tablet (500 mg total) by mouth every 8 (eight) hours as needed for muscle spasms. 03/05/18   Fawze, Mina A, PA-C  Multiple Vitamin (MULTIVITAMIN WITH MINERALS) TABS tablet Take 1 tablet by mouth daily.    [provider]  naloxegol oxalate (MOVANTIK) 12.5 MG TABS tablet Take 1 tablet (12.5 mg total) by mouth daily. 08/04/17   Dorena Dew, FNP  NARCAN 4 MG/0.1ML LIQD nasal spray kit Place 4 mg into the nose once as needed. overdose 12/22/17   [provider]  ondansetron (ZOFRAN) 4 MG tablet Take 1 tablet (4 mg total) by mouth every 8 (  eight) hours as needed for nausea or vomiting. 01/13/18   Dorena Dew, FNP  Oxycodone HCl 10 MG TABS Take 1 tablet (10 mg total) by mouth every 6 (six) hours as needed (pain). Patient not taking: Reported on 02/28/2018 12/10/17   Orpah Greek, MD  predniSONE (DELTASONE) 20 MG tablet Take 2 tablets (40 mg total) by mouth daily with breakfast. Patient not taking: Reported on 12/21/2017 12/10/17   Orpah Greek, MD    Family History No family history on file.  Social History Social History   Tobacco Use  . Smoking status: Former Research scientist (life sciences)  . Smokeless tobacco: Never Used  Substance Use Topics    . Alcohol use: Yes    Comment: Twice a month  . Drug use: No     Allergies   Penicillins   Review of Systems Review of Systems  Constitutional: Negative for fever.  HENT: Positive for congestion.   Eyes: Negative for redness.  Respiratory: Positive for cough. Negative for shortness of breath.   Cardiovascular: Negative for chest pain and leg swelling.  Gastrointestinal: Negative for abdominal pain, diarrhea and vomiting.  Genitourinary: Negative for dysuria and flank pain.  Musculoskeletal: Positive for myalgias. Negative for neck pain and neck stiffness.  Skin: Negative for rash.  Neurological: Negative for headaches.  Hematological: Does not bruise/bleed easily.  Psychiatric/Behavioral: Negative for confusion.     Physical Exam Updated Vital Signs BP 119/85 (BP Location: Right Arm)   Pulse 72   Temp 98.3 F (36.8 C) (Oral)   Resp 16   LMP 03/20/2018   SpO2 100%   Physical Exam  Constitutional: She appears well-developed and well-nourished. No distress.  HENT:  Mouth/Throat: Oropharynx is clear and moist.  Eyes: Conjunctivae are normal. No scleral icterus.  Neck: Neck supple. No tracheal deviation present.  Cardiovascular: Normal rate, regular rhythm, normal heart sounds and intact distal pulses. Exam reveals no gallop and no friction rub.  No murmur heard. Pulmonary/Chest: Effort normal and breath sounds normal. No respiratory distress.  Abdominal: Soft. Normal appearance and bowel sounds are normal. She exhibits no distension. There is no tenderness.  Musculoskeletal: She exhibits no edema or tenderness.  Neurological: She is alert.  Skin: Skin is warm and dry. No rash noted. She is not diaphoretic.  Psychiatric: She has a normal mood and affect.  Nursing note and vitals reviewed.    ED Treatments / Results  Labs (all labs ordered are listed, but only abnormal results are displayed) Results for orders placed or performed during the hospital encounter of  04/03/18  Comprehensive metabolic panel  Result Value Ref Range   Sodium 138 135 - 145 mmol/L   Potassium 3.3 (L) 3.5 - 5.1 mmol/L   Chloride 102 101 - 111 mmol/L   CO2 26 22 - 32 mmol/L   Glucose, Bld 114 (H) 65 - 99 mg/dL   BUN 7 6 - 20 mg/dL   Creatinine, Ser 0.71 0.44 - 1.00 mg/dL   Calcium 8.8 (L) 8.9 - 10.3 mg/dL   Total Protein 6.8 6.5 - 8.1 g/dL   Albumin 3.8 3.5 - 5.0 g/dL   AST 20 15 - 41 U/L   ALT 13 (L) 14 - 54 U/L   Alkaline Phosphatase 51 38 - 126 U/L   Total Bilirubin 0.6 0.3 - 1.2 mg/dL   GFR calc non Af Amer >60 >60 mL/min   GFR calc Af Amer >60 >60 mL/min   Anion gap 10 5 - 15  CBC with  Differential  Result Value Ref Range   WBC 8.3 4.0 - 10.5 K/uL   RBC 4.39 3.87 - 5.11 MIL/uL   Hemoglobin 12.2 12.0 - 15.0 g/dL   HCT 33.0 (L) 36.0 - 46.0 %   MCV 75.2 (L) 78.0 - 100.0 fL   MCH 27.8 26.0 - 34.0 pg   MCHC 37.0 (H) 30.0 - 36.0 g/dL   RDW 15.2 11.5 - 15.5 %   Platelets 311 150 - 400 K/uL   Neutrophils Relative % 49 %   Lymphocytes Relative 35 %   Monocytes Relative 5 %   Eosinophils Relative 11 %   Basophils Relative 0 %   Neutro Abs 4.1 1.7 - 7.7 K/uL   Lymphs Abs 2.9 0.7 - 4.0 K/uL   Monocytes Absolute 0.4 0.1 - 1.0 K/uL   Eosinophils Absolute 0.9 (H) 0.0 - 0.7 K/uL   Basophils Absolute 0.0 0.0 - 0.1 K/uL   RBC Morphology TARGET CELLS   Reticulocytes  Result Value Ref Range   Retic Ct Pct 4.3 (H) 0.4 - 3.1 %   RBC. 4.39 3.87 - 5.11 MIL/uL   Retic Count, Absolute 188.8 (H) 19.0 - 186.0 K/uL  I-Stat beta hCG blood, ED  Result Value Ref Range   I-stat hCG, quantitative <5.0 <5 mIU/mL   Comment 3           Dg Chest 2 View  Result Date: 03/07/2018 CLINICAL DATA:  Sickle cell crisis, back pain, RIGHT leg pain, neck pain and swelling EXAM: CHEST - 2 VIEW COMPARISON:  12/21/2017 FINDINGS: Normal heart size, mediastinal contours, and pulmonary vascularity. Lungs clear. No pleural effusion or pneumothorax. Bones unremarkable. IMPRESSION: Normal exam.  Electronically Signed   By: Lavonia Dana M.D.   On: 03/07/2018 16:40    EKG None  Radiology No results found.  Procedures Procedures (including critical care time)  Medications Ordered in ED Medications  potassium chloride SA (K-DUR,KLOR-CON) CR tablet 40 mEq (has no administration in time range)  HYDROmorphone (DILAUDID) injection 1 mg (has no administration in time range)  ketorolac (TORADOL) 30 MG/ML injection 30 mg (has no administration in time range)     Initial Impression / Assessment and Plan / ED Course  I have reviewed the triage vital signs and the nursing notes.  Pertinent labs & imaging results that were available during my care of the patient were reviewed by me and considered in my medical decision making (see chart for details).  Labs sent.  Reviewed nursing notes and prior charts for additional history.   Dilaudid 1 mg im. toradol 30 mg im.   Labs reviewed - unremarkable except k mildly low.  kcl po.  Po fluids.  Recheck, pt comfortable appearing, and appears stable for d/c.       Final Clinical Impressions(s) / ED Diagnoses   Final diagnoses:  None    ED Discharge Orders    None       Lajean Saver, MD 04/04/18 1148

## 2018-04-04 NOTE — ED Notes (Signed)
Pt nauseated dr aware

## 2018-08-14 ENCOUNTER — Non-Acute Institutional Stay (HOSPITAL_COMMUNITY)
Admission: AD | Admit: 2018-08-14 | Discharge: 2018-08-14 | Disposition: A | Discharge status: Home / Self Care | Payer: Medicaid Other | Source: Ambulatory Visit | Attending: Internal Medicine | Admitting: Internal Medicine

## 2018-08-14 ENCOUNTER — Telehealth (HOSPITAL_COMMUNITY): Payer: Self-pay

## 2018-08-14 ENCOUNTER — Encounter (HOSPITAL_COMMUNITY): Payer: Self-pay | Admitting: General Practice

## 2018-08-14 DIAGNOSIS — D57219 Sickle-cell/Hb-C disease with crisis, unspecified: Secondary | ICD-10-CM | POA: Insufficient documentation

## 2018-08-14 DIAGNOSIS — Z79899 Other long term (current) drug therapy: Secondary | ICD-10-CM | POA: Diagnosis not present

## 2018-08-14 DIAGNOSIS — D5700 Hb-SS disease with crisis, unspecified: Secondary | ICD-10-CM | POA: Diagnosis not present

## 2018-08-14 DIAGNOSIS — M79604 Pain in right leg: Secondary | ICD-10-CM | POA: Diagnosis present

## 2018-08-14 DIAGNOSIS — Z87891 Personal history of nicotine dependence: Secondary | ICD-10-CM | POA: Diagnosis not present

## 2018-08-14 LAB — CBC WITH DIFFERENTIAL/PLATELET
BASOS PCT: 1 %
Basophils Absolute: 0.1 10*3/uL (ref 0.0–0.1)
EOS ABS: 0.7 10*3/uL (ref 0.0–0.7)
Eosinophils Relative: 8 %
HCT: 30.7 % — ABNORMAL LOW (ref 36.0–46.0)
Hemoglobin: 11.6 g/dL — ABNORMAL LOW (ref 12.0–15.0)
LYMPHS ABS: 4.1 10*3/uL — AB (ref 0.7–4.0)
Lymphocytes Relative: 46 %
MCH: 27.6 pg (ref 26.0–34.0)
MCHC: 37.8 g/dL — AB (ref 30.0–36.0)
MCV: 73.1 fL — ABNORMAL LOW (ref 78.0–100.0)
Monocytes Absolute: 0.8 10*3/uL (ref 0.1–1.0)
Monocytes Relative: 9 %
NEUTROS ABS: 3.2 10*3/uL (ref 1.7–7.7)
Neutrophils Relative %: 36 %
Platelets: 415 10*3/uL — ABNORMAL HIGH (ref 150–400)
RBC: 4.2 MIL/uL (ref 3.87–5.11)
RDW: 14.4 % (ref 11.5–15.5)
WBC: 8.9 10*3/uL (ref 4.0–10.5)

## 2018-08-14 LAB — RETICULOCYTES
RBC.: 4.2 MIL/uL (ref 3.87–5.11)
RETIC COUNT ABSOLUTE: 147 10*3/uL (ref 19.0–186.0)
Retic Ct Pct: 3.5 % — ABNORMAL HIGH (ref 0.4–3.1)

## 2018-08-14 LAB — COMPREHENSIVE METABOLIC PANEL
ALK PHOS: 44 U/L (ref 38–126)
ALT: 11 U/L (ref 0–44)
ANION GAP: 8 (ref 5–15)
AST: 24 U/L (ref 15–41)
Albumin: 3.8 g/dL (ref 3.5–5.0)
BILIRUBIN TOTAL: 0.4 mg/dL (ref 0.3–1.2)
BUN: 9 mg/dL (ref 6–20)
CALCIUM: 8.9 mg/dL (ref 8.9–10.3)
CO2: 27 mmol/L (ref 22–32)
Chloride: 104 mmol/L (ref 98–111)
Creatinine, Ser: 0.81 mg/dL (ref 0.44–1.00)
GFR calc non Af Amer: >60 mL/min (ref >60)
GLUCOSE: 90 mg/dL (ref 70–99)
POTASSIUM: 3.5 mmol/L (ref 3.5–5.1)
Sodium: 139 mmol/L (ref 135–145)
TOTAL PROTEIN: 7.5 g/dL (ref 6.5–8.1)

## 2018-08-14 MED ORDER — DEXTROSE-NACL 5-0.45 % IV SOLN
INTRAVENOUS | Status: DC
  Administered 2018-08-14: 11:00:00 via INTRAVENOUS

## 2018-08-14 MED ORDER — HYDROMORPHONE 1 MG/ML IV SOLN
INTRAVENOUS | Status: DC
  Administered 2018-08-14: 5.6 mg via INTRAVENOUS
  Administered 2018-08-14: 30 mg via INTRAVENOUS
  Filled 2018-08-14: qty 30

## 2018-08-14 MED ORDER — NALOXONE HCL 0.4 MG/ML IJ SOLN: 0.4000 mg | INTRAMUSCULAR | Status: DC | PRN

## 2018-08-14 MED ORDER — KETOROLAC TROMETHAMINE 30 MG/ML IJ SOLN
30.0000 mg | Freq: Once | INTRAMUSCULAR | Status: AC
  Administered 2018-08-14: 30 mg via INTRAVENOUS
  Filled 2018-08-14: qty 1

## 2018-08-14 MED ORDER — DIPHENHYDRAMINE HCL 50 MG/ML IJ SOLN
12.5000 mg | Freq: Four times a day (QID) | INTRAMUSCULAR | Status: DC | PRN
  Administered 2018-08-14: 12.5 mg via INTRAVENOUS
  Filled 2018-08-14: qty 1

## 2018-08-14 MED ORDER — OXYCODONE HCL 5 MG PO TABS
5.0000 mg | ORAL_TABLET | Freq: Once | ORAL | Status: AC
  Administered 2018-08-14: 5 mg via ORAL
  Filled 2018-08-14: qty 1

## 2018-08-14 MED ORDER — SODIUM CHLORIDE 0.9% FLUSH: 9.0000 mL | INTRAVENOUS | Status: DC | PRN

## 2018-08-14 MED ORDER — ONDANSETRON HCL 4 MG/2ML IJ SOLN
4.0000 mg | Freq: Four times a day (QID) | INTRAMUSCULAR | Status: DC | PRN
  Administered 2018-08-14: 4 mg via INTRAVENOUS
  Filled 2018-08-14: qty 2

## 2018-08-14 MED ORDER — DIPHENHYDRAMINE HCL 12.5 MG/5ML PO ELIX: 12.5000 mg | ORAL_SOLUTION | Freq: Four times a day (QID) | ORAL | Status: DC | PRN

## 2018-08-14 NOTE — Discharge Instructions (Signed)
Sickle Cell Anemia, Adult °Sickle cell anemia is a condition where your red blood cells are shaped like sickles. Red blood cells carry oxygen through the body. Sickle-shaped red blood cells do not live as long as normal red blood cells. They also clump together and block blood from flowing through the blood vessels. These things prevent the body from getting enough oxygen. Sickle cell anemia causes organ damage and pain. It also increases the risk of infection. °Follow these instructions at home: °· Drink enough fluid to keep your pee (urine) clear or pale yellow. Drink more in hot weather and during exercise. °· Do not smoke. Smoking lowers oxygen levels in the blood. °· Only take over-the-counter or prescription medicines as told by your doctor. °· Take antibiotic medicines as told by your doctor. Make sure you finish them even if you start to feel better. °· Take supplements as told by your doctor. °· Consider wearing a medical alert bracelet. This tells anyone caring for you in an emergency of your condition. °· When traveling, keep your medical information, doctors' names, and the medicines you take with you at all times. °· If you have a fever, do not take fever medicines right away. This could cover up a problem. Tell your doctor. °· Keep all follow-up visits with your doctor. Sickle cell anemia requires regular medical care. °Contact a doctor if: °You have a fever. °Get help right away if: °· You feel dizzy or faint. °· You have new belly (abdominal) pain, especially on the left side near the stomach area. °· You have a lasting, often uncomfortable and painful erection of the penis (priapism). If it is not treated right away, you will become unable to have sex (impotence). °· You have numbness in your arms or legs or you have a hard time moving them. °· You have a hard time talking. °· You have a fever or lasting symptoms for more than 2-3 days. °· You have a fever and your symptoms suddenly get  worse. °· You have signs or symptoms of infection. These include: °? Chills. °? Being more tired than normal (lethargy). °? Irritability. °? Poor eating. °? Throwing up (vomiting). °· You have pain that is not helped with medicine. °· You have shortness of breath. °· You have pain in your chest. °· You are coughing up pus-like or bloody mucus. °· You have a stiff neck. °· Your feet or hands swell or have pain. °· Your belly looks bloated. °· Your joints hurt. °This information is not intended to replace advice given to you by your health care provider. Make sure you discuss any questions you have with your health care provider. °Document Released: 09/26/2013 Document Revised: 05/13/2016 Document Reviewed: 07/18/2013 °Elsevier Interactive Patient Education © 2017 [employer] ° °

## 2018-08-14 NOTE — Progress Notes (Signed)
Patient admitted to day hospital for treatment of sickle cell pain crisis. Patient reports pain rated 8/10 in right leg and hip. Patient placed on Dilaudid PCA, given IV Toradol, PO Oxycodone and hydrated with IV fluids. At discharge patient reported her pain at 3/10. Discharge instructions given to patient. Patient alert, oriented and ambulatory at discharge.

## 2018-08-14 NOTE — Telephone Encounter (Signed)
[facility].  Complains of sickle cell related pain to right leg - rated 10 out of 10.  Denies CP, SOB, nausea, vomiting, abdominal pain or diarrhea.  Last medicated with Ibuprofen and Hydroxyrea.  Spoke with Julianne HandlerLaChina Hollis, FNP who approved patient to come to day hospital.  Patient advised of same.

## 2018-08-14 NOTE — H&P (Signed)
[facility] History and Physical   Date: 08/14/2018  Patient name: Hannah Hawkins Medical record number: [id number] Date of birth: 1986-10-12 Age: 32 y.o. Gender: female PCP: Lanae Boast, [place]  Attending physician: Tresa Garter, MD  Chief Complaint: Right leg pain  History of Present Illness: Hannah Hawkins, a 32 year old female with a history of sickle cell anemia, hemoglobin [place] presents complaining of right leg pain that is consistent with typical sickle cell crisis.  Patient states the pain intensity increased several weeks ago.  She has been taking ibuprofen consistently without sustained relief.  Patient states that pain is worsened by full weightbearing.  She characterizes current pain intensity as constant and aching.  Patient states that she has been taking all other medications consistently as prescribed.  She currently denies headache, chest pain, heart palpitations, shortness of breath, dysuria, nausea, vomiting, or diarrhea. Meds: Medications Prior to Admission  Medication Sig Dispense Refill Last Dose  . cyclobenzaprine (FLEXERIL) 10 MG tablet Take 1 tablet (10 mg total) by mouth 2 (two) times daily as needed for muscle spasms. 20 tablet 0   . ergocalciferol (VITAMIN D2) 50000 units capsule Take 1 capsule (50,000 Units total) by mouth once a week. (Patient taking differently: Take 50,000 Units by mouth every Saturday. ) 12 capsule 3 03/06/2018  . fluticasone (FLONASE) 50 MCG/ACT nasal spray Place 2 sprays into both nostrils daily. (Patient not taking: Reported on 02/28/2018) 16 g 2 Not Taking at Unknown time  . folic acid (FOLVITE) 1 MG tablet Take 1 tablet (1 mg total) by mouth daily. (Patient not taking: Reported on 02/28/2018) 30 tablet 2 Not Taking at Unknown time  . hydroxyurea (HYDREA) 500 MG capsule Take 500 mg by mouth daily.  0 03/05/2018  . ibuprofen (ADVIL,MOTRIN) 800 MG tablet Take 1 tablet (800 mg total) by mouth every 8 (eight) hours as needed  for moderate pain. 30 tablet 5 03/06/2018 at prn  . levorphanol (LEVODROMORAN) 2 MG tablet Take 2 mg by mouth 3 (three) times daily.  0 Past Week at Unknown time  . meclizine (ANTIVERT) 25 MG tablet Take 1 tablet (25 mg total) 3 (three) times daily as needed by mouth for dizziness. (Patient not taking: Reported on 12/21/2017) 30 tablet 0 Not Taking at Unknown time  . methocarbamol (ROBAXIN) 500 MG tablet Take 1 tablet (500 mg total) by mouth every 8 (eight) hours as needed for muscle spasms. 15 tablet 0 03/07/2018 at prn  . Multiple Vitamin (MULTIVITAMIN WITH MINERALS) TABS tablet Take 1 tablet by mouth daily.   03/07/2018 at Unknown time  . naloxegol oxalate (MOVANTIK) 12.5 MG TABS tablet Take 1 tablet (12.5 mg total) by mouth daily. 30 tablet 2 Past Week at Unknown time  . NARCAN 4 MG/0.1ML LIQD nasal spray kit Place 4 mg into the nose once as needed. overdose  0 03/05/2018 at prn  . ondansetron (ZOFRAN) 4 MG tablet Take 1 tablet (4 mg total) by mouth every 8 (eight) hours as needed for nausea or vomiting. 30 tablet 2 Past Week at prn  . Oxycodone HCl 10 MG TABS Take 1 tablet (10 mg total) by mouth every 6 (six) hours as needed (pain). (Patient not taking: Reported on 02/28/2018) 12 tablet 0 Not Taking at Unknown time  . predniSONE (DELTASONE) 20 MG tablet Take 2 tablets (40 mg total) by mouth daily with breakfast. (Patient not taking: Reported on 12/21/2017) 10 tablet 0 Not Taking at Unknown time    Allergies: Penicillins Past Medical History:  Diagnosis Date  . Sickle cell anemia (HCC)    Past Surgical History:  Procedure Laterality Date  . EYE SURGERY  03/09/2017   left eye   No family history on file. Social History   Socioeconomic History  . Marital status: Single    Spouse name: Not on file  . Number of children: Not on file  . Years of education: Not on file  . Highest education level: Not on file  Occupational History  . Not on file  Social Needs  . Financial resource strain: Not  on file  . Food insecurity:    Worry: Not on file    Inability: Not on file  . Transportation needs:    Medical: Not on file    Non-medical: Not on file  Tobacco Use  . Smoking status: Former Research scientist (life sciences)  . Smokeless tobacco: Never Used  Substance and Sexual Activity  . Alcohol use: Yes    Comment: Twice a month  . Drug use: No  . Sexual activity: Yes  Lifestyle  . Physical activity:    Days per week: Not on file    Minutes per session: Not on file  . Stress: Not on file  Relationships  . Social connections:    Talks on phone: Not on file    Gets together: Not on file    Attends religious service: Not on file    Active member of club or organization: Not on file    Attends meetings of clubs or organizations: Not on file    Relationship status: Not on file  . Intimate partner violence:    Fear of current or ex partner: Not on file    Emotionally abused: Not on file    Physically abused: Not on file    Forced sexual activity: Not on file  Other Topics Concern  . Not on file  Social History Narrative  . Not on file  Review of Systems  Constitutional: Negative.   HENT: Negative.   Eyes: Negative.   Respiratory: Negative.   Cardiovascular: Negative.   Gastrointestinal: Negative.   Genitourinary: Negative.   Musculoskeletal: Positive for back pain and joint pain.  Skin: Negative.   Neurological: Negative.   Endo/Heme/Allergies: Negative.   Psychiatric/Behavioral: Negative.    Physical Exam: Blood pressure 128/77, pulse 67, temperature 98.3 F (36.8 C), temperature source Oral, resp. rate 10, SpO2 100 %. Physical Exam  Constitutional: She appears well-developed and well-nourished.  HENT:  Head: Normocephalic and atraumatic.  Eyes: Pupils are equal, round, and reactive to light.  Neck: Normal range of motion. Neck supple.  Cardiovascular: Normal rate, regular rhythm and normal heart sounds.  Pulmonary/Chest: Effort normal and breath sounds normal.  Abdominal: Soft.  Bowel sounds are normal.  Skin: Skin is warm and dry.  Psychiatric: She has a normal mood and affect. Her behavior is normal. Judgment and thought content normal.    Lab results: No results found for this or any previous visit (from the past 24 hour(s)).  Imaging results:  No results found.   Assessment & Plan:  Patient will be admitted to the day infusion center for extended observation  Start IV D5.45 for cellular rehydration at 125/hr  Start Toradol 15 mg IV every 6 hours for inflammation.  Patient opiate naive, will start custom dose PCA dilaudid.   Patient will be re-evaluated for pain intensity in the context of function and relationship to baseline as care progresses.  If no significant pain relief, will transfer patient to  inpatient services for a higher level of care.   Will check CMP, reticulocytes and CBC w/differential    Donia Pounds  APRN, MSN, FNP-C [facility] [address] [place], [place] [address] [phone]  08/14/2018, 10:52 AM

## 2018-08-14 NOTE — Discharge Summary (Signed)
[facility] Discharge Summary   Patient ID: Hannah Hawkins MRN: [id number] DOB/AGE: March 16, 1986 32 y.o.  Admit date: 08/14/2018 Discharge date: 08/14/2018  Primary Care Physician:  Lanae Boast, [place]  Admission Diagnoses:  Active Problems:   Sickle cell pain crisis [facility])  Discharge Medications:  Allergies as of 08/14/2018      Reactions   Penicillins Swelling, Rash   Has patient had a PCN reaction causing immediate rash, facial/tongue/throat swelling, SOB or lightheadedness with hypotension: Yes Has patient had a PCN reaction causing severe rash involving mucus membranes or skin necrosis: Yes Has patient had a PCN reaction that required hospitalization No Has patient had a PCN reaction occurring within the last 10 years: no If all of the above answers are "NO", then may proceed with Cephalosporin use.      Medication List    TAKE these medications   cyclobenzaprine 10 MG tablet Commonly known as:  FLEXERIL Take 1 tablet (10 mg total) by mouth 2 (two) times daily as needed for muscle spasms.   ergocalciferol 50000 units capsule Commonly known as:  VITAMIN D2 Take 1 capsule (50,000 Units total) by mouth once a week. What changed:  when to take this   fluticasone 50 MCG/ACT nasal spray Commonly known as:  FLONASE Place 2 sprays into both nostrils daily.   folic acid 1 MG tablet Commonly known as:  FOLVITE Take 1 tablet (1 mg total) by mouth daily.   hydroxyurea 500 MG capsule Commonly known as:  HYDREA Take 500 mg by mouth daily.   ibuprofen 800 MG tablet Commonly known as:  ADVIL,MOTRIN Take 1 tablet (800 mg total) by mouth every 8 (eight) hours as needed for moderate pain.   levorphanol 2 MG tablet Commonly known as:  LEVODROMORAN Take 2 mg by mouth 3 (three) times daily.   meclizine 25 MG tablet Commonly known as:  ANTIVERT Take 1 tablet (25 mg total) 3 (three) times daily as needed by mouth for dizziness.   methocarbamol 500 MG  tablet Commonly known as:  ROBAXIN Take 1 tablet (500 mg total) by mouth every 8 (eight) hours as needed for muscle spasms.   multivitamin with minerals Tabs tablet Take 1 tablet by mouth daily.   naloxegol oxalate 12.5 MG Tabs tablet Commonly known as:  MOVANTIK Take 1 tablet (12.5 mg total) by mouth daily.   NARCAN 4 MG/0.1ML Liqd nasal spray kit Generic drug:  naloxone Place 4 mg into the nose once as needed. overdose   ondansetron 4 MG tablet Commonly known as:  ZOFRAN Take 1 tablet (4 mg total) by mouth every 8 (eight) hours as needed for nausea or vomiting.   Oxycodone HCl 10 MG Tabs Take 1 tablet (10 mg total) by mouth every 6 (six) hours as needed (pain).   predniSONE 20 MG tablet Commonly known as:  DELTASONE Take 2 tablets (40 mg total) by mouth daily with breakfast.        Consults:  None  Significant Diagnostic Studies:  No results found.   [facility] Course: A 32 year old female with a history of sickle cell anemia admitted to day infusion center pain management and extended observation.  Patient states that she has not been on opiate medications over the past several months, so therefore opiate nave. Hypotonic IV fluids initiated for cellular rehydration Toradol 50 mg IV x1 for inflammation Custom dose PCA Dilaudid for pain control.  Patient used a total of 5.6 mg  with 19 demands and 17  deliveries  Pain intensity decreased to 3/10. Patient alert, oriented and ambulating.   Patient stable.  Patient will discharge home with family in stable condition.  Patient alert, oriented, and ambulating.  Patient hemodynamically stable throughout stay.   Discharge summary:  Resume all home medications Increase fluid intake to 64 ounces per day  The patient was given clear instructions to go to ER or return to medical center if symptoms do not improve, worsen or new problems develop. The patient verbalized understanding.   Avoid stressors that  precipitate sickle cell crisis.     Physical Exam at Discharge:  BP 108/69 (BP Location: Left Arm)   Pulse 73   Temp 98.3 F (36.8 C) (Oral)   Resp 11   LMP 08/13/2018   SpO2 96%  Physical Exam  Constitutional: She is oriented to person, place, and time. She appears well-developed and well-nourished.  Eyes: Pupils are equal, round, and reactive to light.  Neck: Normal range of motion.  Cardiovascular: Normal rate, regular rhythm and normal heart sounds.  Pulmonary/Chest: Effort normal and breath sounds normal.  Abdominal: Soft. Bowel sounds are normal.  Neurological: She is alert and oriented to person, place, and time.  Skin: Skin is warm and dry.  Psychiatric: She has a normal mood and affect. Her behavior is normal. Judgment and thought content normal.    Disposition at Discharge: Discharge disposition: 01-Home or Self Care       Discharge Orders: Discharge Instructions    Discharge patient   Complete by:  As directed    Discharge disposition:  01-Home or Self Care   Discharge patient date:  08/14/2018      Condition at Discharge:   Stable  Time spent on Discharge:  20 minutes  Signed: Donia Pounds  APRN, MSN, FNP-C [facility] [address] [place] [address], [place] [address] [phone]  08/14/2018, 4:03 PM

## 2018-08-17 ENCOUNTER — Ambulatory Visit (INDEPENDENT_AMBULATORY_CARE_PROVIDER_SITE_OTHER): Payer: Medicaid Other | Admitting: Family Medicine

## 2018-08-17 VITALS — BP 119/81 | HR 72 | Temp 98.5°F | Resp 14 | Ht 62.0 in | Wt 143.0 lb

## 2018-08-17 DIAGNOSIS — D5720 Sickle-cell/Hb-C disease without crisis: Secondary | ICD-10-CM | POA: Diagnosis not present

## 2018-08-17 DIAGNOSIS — L03012 Cellulitis of left finger: Secondary | ICD-10-CM | POA: Diagnosis not present

## 2018-08-17 MED ORDER — SULFAMETHOXAZOLE-TRIMETHOPRIM 800-160 MG PO TABS: 1.0000 | ORAL_TABLET | Freq: Two times a day (BID) | ORAL | 0 refills | Status: AC

## 2018-08-17 MED ORDER — BACITRACIN 500 UNIT/GM EX OINT: 1.0000 "application " | TOPICAL_OINTMENT | Freq: Two times a day (BID) | CUTANEOUS | 0 refills | Status: DC

## 2018-08-17 MED ORDER — OXYCODONE HCL 15 MG PO TABS: 15.0000 mg | ORAL_TABLET | Freq: Four times a day (QID) | ORAL | 0 refills | Status: DC | PRN

## 2018-08-17 NOTE — Patient Instructions (Addendum)
Paronychia Paronychia is an infection of the skin. It happens near a fingernail or toenail. It may cause pain and swelling around the nail. Usually, it is not serious and it clears up with treatment. Follow these instructions at home:  Soak the fingers or toes in warm water as told by your doctor. You may be told to do this for 20 minutes, 2-3 times a day.  Keep the area dry when you are not soaking it. Take  Sickle Cell Anemia, Adult Sickle cell anemia is a condition where your red blood cells are shaped like sickles. Red blood cells carry oxygen through the body. Sickle-shaped red blood cells do not live as long as normal red blood cells. They also clump together and block blood from flowing through the blood vessels. These things prevent the body from getting enough oxygen. Sickle cell anemia causes organ damage and pain. It also increases the risk of infection. Follow these instructions at home: Drink enough fluid to keep your pee (urine) clear or pale yellow. Drink more in hot weather and during exercise. Do not smoke. Smoking lowers oxygen levels in the blood. Only take over-the-counter or prescription medicines as told by your doctor. Take antibiotic medicines as told by your doctor. Make sure you finish them even if you start to feel better. Take supplements as told by your doctor. Consider wearing a medical alert bracelet. This tells anyone caring for you in an emergency of your condition. When traveling, keep your medical information, doctors' names, and the medicines you take with you at all times. If you have a fever, do not take fever medicines right away. This could cover up a problem. Tell your doctor. Keep all follow-up visits with your doctor. Sickle cell anemia requires regular medical care. Contact a doctor if: You have a fever. Get help right away if: You feel dizzy or faint. You have new belly (abdominal) pain, especially on the left side near the stomach area. You have  a lasting, often uncomfortable and painful erection of the penis (priapism). If it is not treated right away, you will become unable to have sex (impotence). You have numbness in your arms or legs or you have a hard time moving them. You have a hard time talking. You have a fever or lasting symptoms for more than 2-3 days. You have a fever and your symptoms suddenly get worse. You have signs or symptoms of infection. These include: Chills. Being more tired than normal (lethargy). Irritability. Poor eating. Throwing up (vomiting). You have pain that is not helped with medicine. You have shortness of breath. You have pain in your chest. You are coughing up pus-like or bloody mucus. You have a stiff neck. Your feet or hands swell or have pain. Your belly looks bloated. Your joints hurt. This information is not intended to replace advice given to you by your health care provider. Make sure you discuss any questions you have with your health care provider. Document Released: 09/26/2013 Document Revised: 05/13/2016 Document Reviewed: 07/18/2013 Elsevier Interactive Patient Education  2017 [employer].  medicines only as told by your doctor.  If you were given an antibiotic medicine, finish all of it even if you start to feel better.  Keep the affected area clean.  Do not try to drain a fluid-filled bump yourself.  Wear rubber gloves when putting your hands in water.  Wear gloves if your hands might touch cleaners or chemicals.  Follow your doctor's instructions about: ? Wound care. ? Bandage (  dressing) changes and removal. Contact a doctor if:  Your symptoms get worse or do not improve.  You have a fever or chills.  You have redness spreading from the affected area.  You have more fluid, blood, or pus coming from the affected area.  Your finger or knuckle is swollen or is hard to move. This information is not intended to replace advice given to you by your health care  provider. Make sure you discuss any questions you have with your health care provider. Document Released: 11/24/2009 Document Revised: 05/13/2016 Document Reviewed: 11/13/2014 Elsevier Interactive Patient Education  [employer]2018 Elsevier Inc.

## 2018-08-17 NOTE — Progress Notes (Signed)
   [facility]   Progress Note: General Provider: Mike GipAndre Kristen Bushway, FNP  SUBJECTIVE:   Hannah Hawkins is a 32 y.o. female who  has a past medical history of Sickle cell anemia (HCC).. Patient presents today for Finger Injury (thumb on left swollen ) x 1 week.  Patient admitted to hospital on 08/14/2018 for sickle cell pain crisis.  Review of Systems  Constitutional: Negative.   HENT: Negative.   Eyes: Negative.   Respiratory: Negative.   Cardiovascular: Negative.   Gastrointestinal: Negative.   Genitourinary: Negative.   Musculoskeletal: Negative.   Skin:       Swelling of the left thumb.   Neurological: Negative.   Psychiatric/Behavioral: Negative.      OBJECTIVE: BP 119/81 (BP Location: Left Arm, Patient Position: Sitting, Cuff Size: Normal)   Pulse 72   Temp 98.5 F (36.9 C) (Oral)   Resp 14   Ht 5\' 2"  (1.575 m)   Wt 143 lb (64.9 kg)   LMP 08/13/2018   SpO2 100%   BMI 26.16 kg/m   Physical Exam  Constitutional: She is oriented to person, place, and time. She appears well-developed and well-nourished. No distress.  HENT:  Head: Normocephalic and atraumatic.  Eyes: Pupils are equal, round, and reactive to light. Conjunctivae and EOM are normal.  Neck: Normal range of motion.  Cardiovascular: Normal rate, regular rhythm, normal heart sounds and intact distal pulses.  Pulmonary/Chest: Effort normal and breath sounds normal. No respiratory distress.  Abdominal: Soft. Bowel sounds are normal. She exhibits no distension.  Musculoskeletal: Normal range of motion.       Hands: Neurological: She is alert and oriented to person, place, and time.  Skin: Skin is warm and dry.  Psychiatric: She has a normal mood and affect. Her behavior is normal. Thought content normal.  Nursing note and vitals reviewed.   ASSESSMENT/PLAN:  1. Sickle cell-hemoglobin C disease without crisis (HCC) - oxyCODONE (ROXICODONE) 15 MG immediate  release tablet; Take 1 tablet (15 mg total) by mouth every 6 (six) hours as needed for up to 15 days for pain.  Dispense: 60 tablet; Refill: 0  2. Paronychia of left thumb Cleansed the area with chlorhexidine and inserted 22 gauge needle to the area. Copious amounts of foul smelling liquid expressed. . Cleansed the area with normal saline. Covered with non-adherent  - sulfamethoxazole-trimethoprim (BACTRIM DS,SEPTRA DS) 800-160 MG tablet; Take 1 tablet by mouth 2 (two) times daily for 10 days.  Dispense: 20 tablet; Refill: 0 - bacitracin 500 UNIT/GM ointment; Apply 1 application topically 2 (two) times daily.  Dispense: 15 g; Refill: 0       The patient was given clear instructions to go to ER or return to medical center if symptoms do not improve, worsen or new problems develop. The patient verbalized understanding and agreed with plan of care.   Ms. Hannah Jacksonndr L. Riley Lamouglas, FNP-BC [facility] [address]  [place], [place] [id number] [phone]     This note has been created with Dragon speech recognition software and smart phrase technology. Any transcriptional errors are unintentional.

## 2018-08-18 ENCOUNTER — Ambulatory Visit: Payer: Medicaid Other | Admitting: Family Medicine

## 2018-08-23 ENCOUNTER — Encounter: Payer: Self-pay | Admitting: Family Medicine

## 2018-09-05 ENCOUNTER — Telehealth: Payer: Self-pay

## 2018-09-05 DIAGNOSIS — D57219 Sickle-cell/Hb-C disease with crisis, unspecified: Secondary | ICD-10-CM

## 2018-09-06 ENCOUNTER — Other Ambulatory Visit: Payer: Self-pay | Admitting: Family Medicine

## 2018-09-06 DIAGNOSIS — D5720 Sickle-cell/Hb-C disease without crisis: Secondary | ICD-10-CM

## 2018-09-06 MED ORDER — IBUPROFEN 800 MG PO TABS: 800.0000 mg | ORAL_TABLET | Freq: Three times a day (TID) | ORAL | 5 refills | Status: DC | PRN

## 2018-09-06 MED ORDER — OXYCODONE HCL 15 MG PO TABS: 15.0000 mg | ORAL_TABLET | Freq: Four times a day (QID) | ORAL | 0 refills | Status: DC | PRN

## 2018-09-06 NOTE — Progress Notes (Signed)
Reviewed [place] Substance Reporting system prior to prescribing opiate medications. No inconsistencies noted.  Refilled  

## 2018-09-06 NOTE — Telephone Encounter (Signed)
Refilled ibuprofen

## 2018-09-14 ENCOUNTER — Ambulatory Visit: Payer: Medicaid Other | Admitting: Family Medicine

## 2018-09-21 ENCOUNTER — Telehealth: Payer: Self-pay

## 2018-09-21 DIAGNOSIS — D5720 Sickle-cell/Hb-C disease without crisis: Secondary | ICD-10-CM

## 2018-09-21 MED ORDER — OXYCODONE HCL 15 MG PO TABS: 15.0000 mg | ORAL_TABLET | Freq: Four times a day (QID) | ORAL | 0 refills | Status: DC | PRN

## 2018-09-21 NOTE — Telephone Encounter (Signed)
Refill request for Oxycodone 15mg . Last fill was 09/06/2018. Please advise. Send to [facility] on [address]. Please. Thanks!

## 2018-09-21 NOTE — Telephone Encounter (Signed)
refilled 

## 2018-09-25 ENCOUNTER — Other Ambulatory Visit: Payer: Self-pay | Admitting: Family Medicine

## 2018-09-25 ENCOUNTER — Ambulatory Visit (INDEPENDENT_AMBULATORY_CARE_PROVIDER_SITE_OTHER): Payer: Medicaid Other | Admitting: Family Medicine

## 2018-09-25 ENCOUNTER — Encounter: Payer: Self-pay | Admitting: Family Medicine

## 2018-09-25 VITALS — BP 107/66 | HR 76 | Temp 98.1°F | Resp 16 | Ht 62.0 in | Wt 140.0 lb

## 2018-09-25 DIAGNOSIS — R1030 Lower abdominal pain, unspecified: Secondary | ICD-10-CM

## 2018-09-25 DIAGNOSIS — D5720 Sickle-cell/Hb-C disease without crisis: Secondary | ICD-10-CM | POA: Diagnosis not present

## 2018-09-25 DIAGNOSIS — R110 Nausea: Secondary | ICD-10-CM | POA: Diagnosis not present

## 2018-09-25 DIAGNOSIS — J0100 Acute maxillary sinusitis, unspecified: Secondary | ICD-10-CM

## 2018-09-25 DIAGNOSIS — D57219 Sickle-cell/Hb-C disease with crisis, unspecified: Secondary | ICD-10-CM

## 2018-09-25 DIAGNOSIS — N949 Unspecified condition associated with female genital organs and menstrual cycle: Secondary | ICD-10-CM

## 2018-09-25 LAB — POCT URINALYSIS DIPSTICK
Bilirubin, UA: NEGATIVE
Blood, UA: NEGATIVE
Glucose, UA: NEGATIVE
Ketones, UA: NEGATIVE
Leukocytes, UA: NEGATIVE
Nitrite, UA: NEGATIVE
Protein, UA: NEGATIVE
Spec Grav, UA: 1.015 (ref 1.010–1.025)
Urobilinogen, UA: 1 E.U./dL
pH, UA: 6 (ref 5.0–8.0)

## 2018-09-25 MED ORDER — AZITHROMYCIN 250 MG PO TABS: ORAL_TABLET | ORAL | 0 refills | Status: DC

## 2018-09-25 MED ORDER — LEVOCETIRIZINE DIHYDROCHLORIDE 5 MG PO TABS: 5.0000 mg | ORAL_TABLET | Freq: Every evening | ORAL | 0 refills | Status: DC

## 2018-09-25 MED ORDER — IBUPROFEN 800 MG PO TABS: 800.0000 mg | ORAL_TABLET | Freq: Three times a day (TID) | ORAL | 5 refills | Status: DC | PRN

## 2018-09-25 MED ORDER — METHYLPREDNISOLONE 4 MG PO TBPK: ORAL_TABLET | ORAL | 0 refills | Status: DC

## 2018-09-25 MED ORDER — ONDANSETRON HCL 8 MG PO TABS: 8.0000 mg | ORAL_TABLET | Freq: Three times a day (TID) | ORAL | 2 refills | Status: DC | PRN

## 2018-09-25 MED ORDER — HYDROXYUREA 500 MG PO CAPS: 15.0000 mg/kg | ORAL_CAPSULE | Freq: Every day | ORAL | 2 refills | Status: DC

## 2018-09-25 NOTE — Progress Notes (Signed)
[facility] INTERNAL MEDICINE AND SICKLE CELL CARE  SICKLE CELL ANEMIA FOLLOW UP VISIT PROVIDER: Mike Gip, FNP    Subjective:   Hannah Hawkins  is a 32 y.o.  female who  has a past medical history of Sickle cell anemia (HCC). presents for a follow up for Sickle Cell Anemia. her last hospitalization was 08/14/2018. she has had more than 6 ED visits/ hospitalizations in the past 12 months.  Pain regimen includes: Ibuprofen and oxicodone Hydrea Therapy: Yes Medication compliance: Yes  Pain today is 6/10 and is in her legs. Patient reports drinking 80 ounces of fluid per day.  Patient states that she is having more pain than she ever has in the past. States that her pain in happening more frequently and the pain medication is not relieving it. Patient states that she needs medications added "to balance it all out".  Patient states that she is getting "heavy sweats and migraines" after taking the oxycodone 15 mg. Patient states that when she takes the oxy with ibuprofen, it helps to stop the migraines from occurring.  Patient states that she has had nasal congestion x 1 week. Patient states that she has taken otc medications without relief.  Patient also reports that she has a genital irritation/lesion that occurs prior to menstruation.  She states that the lesion is not present today.  She would like to be checked for HSV and STIs today.  No lesion present today and she declines a vaginal examination.  She will return for her well woman exam at her next visit.  Review of Systems  Constitutional: Negative.   HENT: Negative.   Eyes: Negative.   Respiratory: Negative.   Cardiovascular: Negative.   Gastrointestinal: Negative.   Genitourinary: Negative.   Musculoskeletal: Positive for joint pain and myalgias.  Skin: Negative.   Neurological: Positive for headaches. Negative for dizziness, tingling, sensory change, speech change, seizures, loss of consciousness and weakness.    Psychiatric/Behavioral: Negative.     Objective:   Objective  BP 107/66 (BP Location: Right Arm, Patient Position: Sitting, Cuff Size: Normal)   Pulse 76   Temp 98.1 F (36.7 C) (Oral)   Resp 16   Ht 5\' 2"  (1.575 m)   Wt 140 lb (63.5 kg)   SpO2 100%   BMI 25.61 kg/m    Physical Exam  Constitutional: She is oriented to person, place, and time. She appears well-developed and well-nourished. No distress.  HENT:  Head: Normocephalic and atraumatic.  Nose: Mucosal edema and rhinorrhea present. Right sinus exhibits maxillary sinus tenderness and frontal sinus tenderness. Left sinus exhibits maxillary sinus tenderness and frontal sinus tenderness.  Eyes: Pupils are equal, round, and reactive to light. Conjunctivae and EOM are normal.  Neck: Normal range of motion.  Cardiovascular: Normal rate, regular rhythm, normal heart sounds and intact distal pulses.  Pulmonary/Chest: Effort normal and breath sounds normal. No respiratory distress.  Abdominal: Soft. Bowel sounds are normal. She exhibits no distension.  Musculoskeletal: Normal range of motion.  Neurological: She is alert and oriented to person, place, and time.  Skin: Skin is warm and dry.  Psychiatric: She has a normal mood and affect. Her behavior is normal. Thought content normal.  Nursing note and vitals reviewed.    Assessment/Plan:   Assessment   Encounter Diagnoses  Name Primary?  . 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 Sickle cell disease, type [place], with unspecified crisis (HCC)   . Nausea   . Lower abdominal  pain   . Genital lesion, female   . Acute non-recurrent maxillary sinusitis      Plan  1. Sickle cell-hemoglobin C disease without crisis (HCC) Increased Hydrea to 1000 mg daily.  Patient has been taking 500 mg for several years.  She is experiencing more pain.  We will increase hydroxyurea to see if that helps with pain.  We will keep her oxycodone at the same level or dose. -  Urinalysis Dipstick - hydroxyurea (HYDREA) 500 MG capsule; Take 2 capsules (1,000 mg total) by mouth daily.  Dispense: 60 capsule; Refill: 2 - ibuprofen (ADVIL,MOTRIN) 800 MG tablet; Take 1 tablet (800 mg total) by mouth every 8 (eight) hours as needed for moderate pain.  Dispense: 30 tablet; Refill: 5   Nausea Pregnancy test negative today.  Patient experiences nausea with her narcotic medication. - ondansetron (ZOFRAN) 8 MG tablet; Take 1 tablet (8 mg total) by mouth every 8 (eight) hours as needed for up to 15 days for nausea or vomiting.  Dispense: 30 tablet; Refill: 2  4. Lower abdominal pain Will check for UTI. - CBC with Differential - Chlamydia/GC NAA, Confirmation - HSV Type I/II IgG, IgMw/ reflex - HIV antibody (with reflex)  5. Genital lesion, female No lesions at the present time.  Patient would like blood work to test for HSV.  Explained to patient that this could be HSV 1 or 2 or Candida.  The blood work will show if she has ever been exposed to HSV and will type it.  It will not show date of exposure. - HSV Type I/II IgG, IgMw/ reflex  6. Acute non-recurrent maxillary sinusitis Can take otc antihistamine - azithromycin (ZITHROMAX) 250 MG tablet; Take as prescribed on pack  Dispense: 6 tablet; Refill: 0 - methylPREDNISolone (MEDROL DOSEPAK) 4 MG TBPK tablet; Take as directed on pack  Dispense: 21 tablet; Refill: 0    Return to care as scheduled and prn. Patient verbalized understanding and agreed with plan of care.   1. Sickle cell disease - Continue Hydrea.  We increased hydroxyurea today to 1000 mg.   We discussed the need for good hydration, monitoring of hydration status, avoidance of heat, cold, stress, and infection triggers. We discussed the risks and benefits of Hydrea, including bone marrow suppression, the possibility of GI upset, skin ulcers, hair thinning, and teratogenicity. The patient was reminded of the need to seek medical attention of any symptoms of  bleeding, anemia, or infection. Continue folic acid 1 mg daily to prevent aplastic bone marrow crises.   2. Pulmonary evaluation - Patient denies severe recurrent wheezes, shortness of breath with exercise, or persistent cough. If these symptoms develop, pulmonary function tests with spirometry will be ordered, and if abnormal, plan on referral to Pulmonology for further evaluation.  3. Cardiac - Routine screening for pulmonary hypertension is not recommended.  4. Eye - High risk of proliferative retinopathy. Annual eye exam with retinal exam recommended to patient.  5. Immunization status -  Yearly influenza vaccination is recommended, as well as being up to date with Meningococcal and Pneumococcal vaccines.   6. Acute and chronic painful episodes - We discussed that pt is to receive Schedule II prescriptions only from [place]. Pt is also aware that the prescription history is available to [place] online through the [facility] CSRS. Controlled substance agreement signed . We reminded Emonni Depasquale that all patients receiving Schedule II narcotics must be seen for follow within one month of prescription being requested. We reviewed the terms  of our pain agreement, including the need to keep medicines in a safe locked location away from children or pets, and the need to report excess sedation or constipation, measures to avoid constipation, and policies related to early refills and stolen prescriptions. According to the [place] [facility] program, we have reviewed details related to analgesia, adverse effects, aberrant behaviors.  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  Not applicable at this time.  If this occurs will use Exjade for management.   8. Vitamin D deficiency - Drisdol 50,000 units weekly. Patient encouraged to take as prescribed.   The above recommendations are taken from the NIH Evidence-Based Management of Sickle Cell Disease: Expert Panel Report, 16109.   Ms. Andr L. Riley Lam,  FNP-BC [facility] [address] [place], [place] [address] [phone]  This note has been created with Dragon speech recognition software and smart phrase technology. Any transcriptional errors are unintentional.

## 2018-09-25 NOTE — Patient Instructions (Signed)
I increased your hydroxurea to 1000mg  daily.    Sickle Cell Anemia, Adult Sickle cell anemia is a condition where your red blood cells are shaped like sickles. Red blood cells carry oxygen through the body. Sickle-shaped red blood cells do not live as long as normal red blood cells. They also clump together and block blood from flowing through the blood vessels. These things prevent the body from getting enough oxygen. Sickle cell anemia causes organ damage and pain. It also increases the risk of infection. Follow these instructions at home:  Drink enough fluid to keep your pee (urine) clear or pale yellow. Drink more in hot weather and during exercise.  Do not smoke. Smoking lowers oxygen levels in the blood.  Only take over-the-counter or prescription medicines as told by your doctor.  Take antibiotic medicines as told by your doctor. Make sure you finish them even if you start to feel better.  Take supplements as told by your doctor.  Consider wearing a medical alert bracelet. This tells anyone caring for you in an emergency of your condition.  When traveling, keep your medical information, doctors' names, and the medicines you take with you at all times.  If you have a fever, do not take fever medicines right away. This could cover up a problem. Tell your doctor.  Keep all follow-up visits with your doctor. Sickle cell anemia requires regular medical care. Contact a doctor if: You have a fever. Get help right away if:  You feel dizzy or faint.  You have new belly (abdominal) pain, especially on the left side near the stomach area.  You have a lasting, often uncomfortable and painful erection of the penis (priapism). If it is not treated right away, you will become unable to have sex (impotence).  You have numbness in your arms or legs or you have a hard time moving them.  You have a hard time talking.  You have a fever or lasting symptoms for more than 2-3 days.  You have  a fever and your symptoms suddenly get worse.  You have signs or symptoms of infection. These include: ? Chills. ? Being more tired than normal (lethargy). ? Irritability. ? Poor eating. ? Throwing up (vomiting).  You have pain that is not helped with medicine.  You have shortness of breath.  You have pain in your chest.  You are coughing up pus-like or bloody mucus.  You have a stiff neck.  Your feet or hands swell or have pain.  Your belly looks bloated.  Your joints hurt. This information is not intended to replace advice given to you by your health care provider. Make sure you discuss any questions you have with your health care provider. Document Released: 09/26/2013 Document Revised: 05/13/2016 Document Reviewed: 07/18/2013 Elsevier Interactive Patient Education  2017 [employer]. Sinusitis, Adult Sinusitis is soreness and inflammation of your sinuses. Sinuses are hollow spaces in the bones around your face. They are located:  Around your eyes.  In the middle of your forehead.  Behind your nose.  In your cheekbones.  Your sinuses and nasal passages are lined with a stringy fluid (mucus). Mucus normally drains out of your sinuses. When your nasal tissues get inflamed or swollen, the mucus can get trapped or blocked so air cannot flow through your sinuses. This lets bacteria, viruses, and funguses grow, and that leads to infection. Follow these instructions at home: Medicines  Take, use, or apply over-the-counter and prescription medicines only as told by your  doctor. These may include nasal sprays.  If you were prescribed an antibiotic medicine, take it as told by your doctor. Do not stop taking the antibiotic even if you start to feel better. Hydrate and Humidify  Drink enough water to keep your pee (urine) clear or pale yellow.  Use a cool mist humidifier to keep the humidity level in your home above 50%.  Breathe in steam for 10-15 minutes, 3-4 times a day  or as told by your doctor. You can do this in the bathroom while a hot shower is running.  Try not to spend time in cool or dry air. Rest  Rest as much as possible.  Sleep with your head raised (elevated).  Make sure to get enough sleep each night. General instructions  Put a warm, moist washcloth on your face 3-4 times a day or as told by your doctor. This will help with discomfort.  Wash your hands often with soap and water. If there is no soap and water, use hand sanitizer.  Do not smoke. Avoid being around people who are smoking (secondhand smoke).  Keep all follow-up visits as told by your doctor. This is important. Contact a doctor if:  You have a fever.  Your symptoms get worse.  Your symptoms do not get better within 10 days. Get help right away if:  You have a very bad headache.  You cannot stop throwing up (vomiting).  You have pain or swelling around your face or eyes.  You have trouble seeing.  You feel confused.  Your neck is stiff.  You have trouble breathing. This information is not intended to replace advice given to you by your health care provider. Make sure you discuss any questions you have with your health care provider. Document Released: 05/24/2008 Document Revised: 08/01/2016 Document Reviewed: 10/01/2015 Elsevier Interactive Patient Education  [employer].

## 2018-09-27 LAB — CBC WITH DIFFERENTIAL/PLATELET
Basophils Absolute: 0.1 10*3/uL (ref 0.0–0.2)
Basos: 2 %
EOS (ABSOLUTE): 0.4 10*3/uL (ref 0.0–0.4)
Eos: 9 %
Hematocrit: 31.5 % — ABNORMAL LOW (ref 34.0–46.6)
Hemoglobin: 10.5 g/dL — ABNORMAL LOW (ref 11.1–15.9)
Immature Grans (Abs): 0 10*3/uL (ref 0.0–0.1)
Immature Granulocytes: 0 %
Lymphocytes Absolute: 2 10*3/uL (ref 0.7–3.1)
Lymphs: 42 %
MCH: 26.6 pg (ref 26.6–33.0)
MCHC: 33.3 g/dL (ref 31.5–35.7)
MCV: 80 fL (ref 79–97)
Monocytes Absolute: 0.6 10*3/uL (ref 0.1–0.9)
Monocytes: 12 %
Neutrophils Absolute: 1.7 10*3/uL (ref 1.4–7.0)
Neutrophils: 35 %
Platelets: 369 10*3/uL (ref 150–450)
RBC: 3.95 x10E6/uL (ref 3.77–5.28)
RDW: 15.7 % — ABNORMAL HIGH (ref 12.3–15.4)
WBC: 4.8 10*3/uL (ref 3.4–10.8)

## 2018-09-27 LAB — CHLAMYDIA/GC NAA, CONFIRMATION
Chlamydia trachomatis, NAA: NEGATIVE
Neisseria gonorrhoeae, NAA: NEGATIVE

## 2018-09-27 LAB — HSV TYPE I/II IGG, IGMW/ REFLEX
HSV 1 Glycoprotein G Ab, IgG: 15.1 index — ABNORMAL HIGH (ref 0.00–0.90)
HSV 1 IgM: <1:10 {titer}
HSV 2 IgG, Type Spec: 11.7 index — ABNORMAL HIGH (ref 0.00–0.90)
HSV 2 IgM: <1:10 {titer}

## 2018-09-27 LAB — HIV ANTIBODY (ROUTINE TESTING W REFLEX): HIV Screen 4th Generation wRfx: NONREACTIVE

## 2018-10-05 ENCOUNTER — Telehealth: Payer: Self-pay

## 2018-10-05 DIAGNOSIS — D5720 Sickle-cell/Hb-C disease without crisis: Secondary | ICD-10-CM

## 2018-10-06 MED ORDER — OXYCODONE HCL 15 MG PO TABS: 15.0000 mg | ORAL_TABLET | Freq: Four times a day (QID) | ORAL | 0 refills | Status: DC | PRN

## 2018-10-06 NOTE — Telephone Encounter (Signed)
Refilled

## 2018-10-16 ENCOUNTER — Emergency Department (HOSPITAL_COMMUNITY): Payer: Medicaid Other

## 2018-10-16 ENCOUNTER — Emergency Department (HOSPITAL_COMMUNITY)
Admission: EM | Admit: 2018-10-16 | Discharge: 2018-10-17 | Disposition: A | Discharge status: Home / Self Care | Payer: Medicaid Other | Attending: Emergency Medicine | Admitting: Emergency Medicine

## 2018-10-16 ENCOUNTER — Other Ambulatory Visit: Payer: Self-pay

## 2018-10-16 ENCOUNTER — Encounter (HOSPITAL_COMMUNITY): Payer: Self-pay | Admitting: Emergency Medicine

## 2018-10-16 DIAGNOSIS — Z79899 Other long term (current) drug therapy: Secondary | ICD-10-CM | POA: Insufficient documentation

## 2018-10-16 DIAGNOSIS — D5700 Hb-SS disease with crisis, unspecified: Secondary | ICD-10-CM

## 2018-10-16 DIAGNOSIS — Z87891 Personal history of nicotine dependence: Secondary | ICD-10-CM | POA: Diagnosis not present

## 2018-10-16 DIAGNOSIS — D571 Sickle-cell disease without crisis: Secondary | ICD-10-CM | POA: Diagnosis not present

## 2018-10-16 DIAGNOSIS — J00 Acute nasopharyngitis [common cold]: Secondary | ICD-10-CM | POA: Diagnosis not present

## 2018-10-16 DIAGNOSIS — J069 Acute upper respiratory infection, unspecified: Secondary | ICD-10-CM | POA: Diagnosis present

## 2018-10-16 LAB — CBC WITH DIFFERENTIAL/PLATELET
ABS IMMATURE GRANULOCYTES: 0.05 10*3/uL (ref 0.00–0.07)
BASOS PCT: 1 %
Basophils Absolute: 0.1 10*3/uL (ref 0.0–0.1)
Eosinophils Absolute: 1.2 10*3/uL — ABNORMAL HIGH (ref 0.0–0.5)
Eosinophils Relative: 12 %
HCT: 32 % — ABNORMAL LOW (ref 36.0–46.0)
Hemoglobin: 11.6 g/dL — ABNORMAL LOW (ref 12.0–15.0)
Immature Granulocytes: 1 %
LYMPHS PCT: 18 %
Lymphs Abs: 1.8 10*3/uL (ref 0.7–4.0)
MCH: 27.4 pg (ref 26.0–34.0)
MCHC: 36.3 g/dL — AB (ref 30.0–36.0)
MCV: 75.7 fL — ABNORMAL LOW (ref 80.0–100.0)
MONO ABS: 0.9 10*3/uL (ref 0.1–1.0)
Monocytes Relative: 9 %
NEUTROS ABS: 6.1 10*3/uL (ref 1.7–7.7)
Neutrophils Relative %: 59 %
PLATELETS: 285 10*3/uL (ref 150–400)
RBC: 4.23 MIL/uL (ref 3.87–5.11)
RDW: 14.8 % (ref 11.5–15.5)
WBC: 10.2 10*3/uL (ref 4.0–10.5)
nRBC: 1.1 % — ABNORMAL HIGH (ref 0.0–0.2)

## 2018-10-16 LAB — COMPREHENSIVE METABOLIC PANEL
ALT: 14 U/L (ref 0–44)
AST: 22 U/L (ref 15–41)
Albumin: 3.7 g/dL (ref 3.5–5.0)
Alkaline Phosphatase: 39 U/L (ref 38–126)
Anion gap: 6 (ref 5–15)
BILIRUBIN TOTAL: 0.6 mg/dL (ref 0.3–1.2)
BUN: 7 mg/dL (ref 6–20)
CHLORIDE: 107 mmol/L (ref 98–111)
CO2: 24 mmol/L (ref 22–32)
Calcium: 9.3 mg/dL (ref 8.9–10.3)
Creatinine, Ser: 0.9 mg/dL (ref 0.44–1.00)
GFR calc Af Amer: >60 mL/min (ref >60)
Glucose, Bld: 96 mg/dL (ref 70–99)
POTASSIUM: 3.9 mmol/L (ref 3.5–5.1)
Sodium: 137 mmol/L (ref 135–145)
TOTAL PROTEIN: 6.8 g/dL (ref 6.5–8.1)

## 2018-10-16 LAB — I-STAT BETA HCG BLOOD, ED (MC, WL, AP ONLY)

## 2018-10-16 LAB — RETICULOCYTES
IMMATURE RETIC FRACT: 16.8 % — AB (ref 2.3–15.9)
RBC.: 4.23 MIL/uL (ref 3.87–5.11)
RETIC CT PCT: 3.2 % — AB (ref 0.4–3.1)
Retic Count, Absolute: 136.6 10*3/uL (ref 19.0–186.0)

## 2018-10-16 NOTE — ED Triage Notes (Signed)
Pt reports sickle cell pain that started back up yesterday and got worse today. Pt reports URI sx over the last week that has gotten worse yesterday. Pt reports nasal drainage, left ear pain, non-productive cough. Lungs clear in triage, respirations regular.

## 2018-10-17 MED ORDER — KETOROLAC TROMETHAMINE 60 MG/2ML IM SOLN
30.0000 mg | Freq: Once | INTRAMUSCULAR | Status: AC
  Administered 2018-10-17: 30 mg via INTRAMUSCULAR
  Filled 2018-10-17: qty 2

## 2018-10-17 MED ORDER — OXYCODONE HCL 5 MG PO TABS
15.0000 mg | ORAL_TABLET | Freq: Once | ORAL | Status: AC
  Administered 2018-10-17: 15 mg via ORAL
  Filled 2018-10-17: qty 3

## 2018-10-17 MED ORDER — ACETAMINOPHEN 500 MG PO TABS
1000.0000 mg | ORAL_TABLET | Freq: Once | ORAL | Status: AC
  Administered 2018-10-17: 1000 mg via ORAL
  Filled 2018-10-17: qty 2

## 2018-10-17 NOTE — Discharge Instructions (Addendum)
You may take over-the-counter medicine for symptomatic relief, such as Tylenol, Motrin, TheraFlu, Alka seltzer , black elderberry, etc. Please limit acetaminophen (Tylenol) to 4000 mg and Ibuprofen (Motrin, Advil, etc.) to 2400 mg for a 24hr period. Please note that other over-the-counter medicine may contain acetaminophen or ibuprofen as a component of their ingredients.   

## 2018-10-17 NOTE — ED Provider Notes (Signed)
[facility] EMERGENCY DEPARTMENT Provider Note  CSN: [id number] Arrival date & time: 10/16/18 2039  Chief Complaint(s) Sickle Cell Pain Crisis and URI  HPI Hannah Hawkins is a 32 y.o. female with a history of sickle cell anemia on oxycodone and hydroxyurea who presents to the emergency department with 2 days of right leg pain that similar to her chronic sickle cell pain.  She reports that she ran out of her oxycodone 2 days ago.  Pain has been gradually worsening since onset.  Exacerbated with ambulation and palpation.  She denies any associated swelling.  She denies any fevers or chills.  She denies any trauma.  She did endorse 1 week history of runny nose, nasal congestion and dry cough.  Again no fevers.  She denies any known sick contacts.  Denies any chest pain or shortness of breath.  HPI Past Medical History Past Medical History:  Diagnosis Date  . Sickle cell anemia [facility])    Patient Active Problem List   Diagnosis Date Noted  . Sickle cell pain crisis ([place]) 11/14/2017  . Anemia of chronic disease 11/13/2017  . Sinusitis, chronic 11/13/2017  . Sickle-cell/Hb-C disease ([place]) 02/18/2017   Home Medication(s) Prior to Admission medications   Medication Sig Start Date End Date Taking? Authorizing Provider  azithromycin (ZITHROMAX) 250 MG tablet Take as prescribed on pack 09/25/18   Lanae Boast, FNP  ergocalciferol (VITAMIN D2) 50000 units capsule Take 1 capsule (50,000 Units total) by mouth once a week. Patient not taking: Reported on 08/17/2018 02/22/17   Dorena Dew, FNP  folic acid (FOLVITE) 1 MG tablet Take 1 tablet (1 mg total) by mouth daily. Patient not taking: Reported on 02/28/2018 11/11/17   Barton Dubois, MD  hydroxyurea (HYDREA) 500 MG capsule Take 2 capsules (1,000 mg total) by mouth daily. 09/25/18   Lanae Boast, FNP  ibuprofen (ADVIL,MOTRIN) 800 MG tablet Take 1 tablet (800 mg total) by mouth every 8 (eight) hours as needed for moderate  pain. 09/25/18   Lanae Boast, FNP  levocetirizine (XYZAL) 5 MG tablet TAKE 1 TABLET(5 MG) BY MOUTH EVERY EVENING 09/25/18   Lanae Boast, FNP  methylPREDNISolone (MEDROL DOSEPAK) 4 MG TBPK tablet Take as directed on pack 09/25/18   Lanae Boast, FNP  Multiple Vitamin (MULTIVITAMIN WITH MINERALS) TABS tablet Take 1 tablet by mouth daily.    [provider]  NARCAN 4 MG/0.1ML LIQD nasal spray kit Place 4 mg into the nose once as needed. overdose 12/22/17   [provider]  oxyCODONE (ROXICODONE) 15 MG immediate release tablet Take 1 tablet (15 mg total) by mouth every 6 (six) hours as needed for up to 15 days for pain. 10/06/18 10/21/18  Lanae Boast, FNP                                                                                                                                    Past Surgical History  Past Surgical History:  Procedure Laterality Date  . EYE SURGERY  03/09/2017   left eye   Family History No family history on file.  Social History Social History   Tobacco Use  . Smoking status: Former Research scientist (life sciences)  . Smokeless tobacco: Never Used  Substance Use Topics  . Alcohol use: Yes    Comment: Twice a month  . Drug use: No   Allergies Penicillins  Review of Systems Review of Systems All other systems are reviewed and are negative for acute change except as noted in the HPI  Physical Exam Vital Signs  I have reviewed the triage vital signs BP 102/67   Pulse 85   Temp 98.8 F (37.1 C) (Oral)   Resp 15   Ht _0  (1.6 m)   Wt 65.3 kg   LMP 10/04/2018   SpO2 100%   BMI 25.51 kg/m   Physical Exam  Constitutional: She is oriented to person, place, and time. She appears well-developed and well-nourished. No distress.  HENT:  Head: Normocephalic and atraumatic.  Nose: Mucosal edema and rhinorrhea present.  Post nasal drip  Eyes: Pupils are equal, round, and reactive to light. Conjunctivae and EOM are normal. Right eye exhibits no discharge. Left  eye exhibits no discharge. No scleral icterus.  Neck: Normal range of motion. Neck supple.  Cardiovascular: Normal rate and regular rhythm. Exam reveals no gallop and no friction rub.  No murmur heard. Pulmonary/Chest: Effort normal and breath sounds normal. No stridor. No respiratory distress. She has no rales.  Abdominal: Soft. She exhibits no distension. There is no tenderness.  Musculoskeletal: She exhibits no edema.       Right lower leg: She exhibits tenderness. She exhibits no swelling and no edema.       Legs: Neurological: She is alert and oriented to person, place, and time.  Skin: Skin is warm and dry. No rash noted. She is not diaphoretic. No erythema.  Psychiatric: She has a normal mood and affect.  Vitals reviewed.   ED Results and Treatments Labs (all labs ordered are listed, but only abnormal results are displayed) Labs Reviewed  CBC WITH DIFFERENTIAL/PLATELET - Abnormal; Notable for the following components:      Result Value   Hemoglobin 11.6 (*)    HCT 32.0 (*)    MCV 75.7 (*)    MCHC 36.3 (*)    nRBC 1.1 (*)    Eosinophils Absolute 1.2 (*)    All other components within normal limits  RETICULOCYTES - Abnormal; Notable for the following components:   Retic Ct Pct 3.2 (*)    Immature Retic Fract 16.8 (*)    All other components within normal limits  COMPREHENSIVE METABOLIC PANEL  I-STAT BETA HCG BLOOD, ED ([facility], [facility], [facility] ONLY)                                                                                                                         EKG  EKG Interpretation  Date/Time:    Ventricular Rate:    PR Interval:    QRS Duration:   QT Interval:    QTC Calculation:   R Axis:     Text Interpretation:        Radiology Dg Chest 2 View  Result Date: 10/16/2018 CLINICAL DATA:  Chest pain and dyspnea and dry cough. History of sickle cell. EXAM: CHEST - 2 VIEW COMPARISON:  04/04/2018 FINDINGS: The heart size and mediastinal contours are within normal  limits. Both lungs are clear. The visualized skeletal structures are unremarkable. IMPRESSION: No active cardiopulmonary disease. Electronically Signed   By: Ashley Royalty M.D.   On: 10/16/2018 21:20   Pertinent labs & imaging results that were available during my care of the patient were reviewed by me and considered in my medical decision making (see chart for details).  Medications Ordered in ED Medications  oxyCODONE (Oxy IR/ROXICODONE) immediate release tablet 15 mg (15 mg Oral Given 10/17/18 0118)  ketorolac (TORADOL) injection 30 mg (30 mg Intramuscular Given 10/17/18 0119)                                                                                                                                    Procedures Procedures  (including critical care time)  Medical Decision Making / ED Course I have reviewed the nursing notes for this encounter and the patient's prior records (if available in EHR or on provided paperwork).    1. URI Patient here with URI symptoms without fevers.  Chest x-ray negative for evidence of bronchitis or pneumonia.  2.  Right leg pain Typical for her sickle cell pain.  Likely due to not having her home medication.  Labs are grossly reassuring without evidence of hemolytic, aplastic crisis.  She denied any trauma warranting plain films.  There is no associated swelling concerning for DVT.  Provided with IM Toradol and oral pain medicine.  The patient appears reasonably screened and/or stabilized for discharge and I doubt any other medical condition or other [facility] requiring further screening, evaluation, or treatment in the ED at this time prior to discharge.  The patient is safe for discharge with strict return precautions.   Final Clinical Impression(s) / ED Diagnoses Final diagnoses:  Acute nasopharyngitis  Sickle cell anemia with pain ([place])   Disposition: Discharge  Condition: Good  I have discussed the results, Dx and Tx plan with the patient who  expressed understanding and agree(s) with the plan. Discharge instructions discussed at great length. The patient was given strict return precautions who verbalized understanding of the instructions. No further questions at time of discharge.    ED Discharge Orders    None       Follow Up: Lanae Boast, [place] [id number] [phone]  Schedule an appointment as soon as possible for a visit  As needed      This chart was dictated  using voice recognition software.  Despite best efforts to proofread,  errors can occur which can change the documentation meaning.   Fatima Blank, MD 10/17/18 [phone]

## 2018-10-20 ENCOUNTER — Other Ambulatory Visit: Payer: Self-pay | Admitting: Family Medicine

## 2018-10-20 ENCOUNTER — Other Ambulatory Visit: Payer: Self-pay

## 2018-10-20 ENCOUNTER — Telehealth: Payer: Self-pay

## 2018-10-20 DIAGNOSIS — D5720 Sickle-cell/Hb-C disease without crisis: Secondary | ICD-10-CM

## 2018-10-20 MED ORDER — OXYCODONE HCL 15 MG PO TABS: 15.0000 mg | ORAL_TABLET | Freq: Four times a day (QID) | ORAL | 0 refills | Status: DC | PRN

## 2018-10-20 NOTE — Telephone Encounter (Signed)
Refill request for oxycodone 15mg . Last fill was on 10/06/2018. Please advise. Thanks!

## 2018-10-20 NOTE — Telephone Encounter (Signed)
Hannah Hawkins, Please advise if this can be sent in. Thanks!

## 2018-10-30 ENCOUNTER — Encounter: Payer: Self-pay | Admitting: Family Medicine

## 2018-10-30 ENCOUNTER — Ambulatory Visit (INDEPENDENT_AMBULATORY_CARE_PROVIDER_SITE_OTHER): Payer: Medicaid Other | Admitting: Family Medicine

## 2018-10-30 ENCOUNTER — Ambulatory Visit (HOSPITAL_COMMUNITY)
Admission: RE | Admit: 2018-10-30 | Discharge: 2018-10-30 | Disposition: A | Discharge status: Home / Self Care | Payer: Medicaid Other | Source: Ambulatory Visit | Attending: Family Medicine | Admitting: Family Medicine

## 2018-10-30 VITALS — BP 112/71 | HR 78 | Temp 98.1°F | Resp 16 | Ht 62.0 in | Wt 146.0 lb

## 2018-10-30 DIAGNOSIS — M546 Pain in thoracic spine: Secondary | ICD-10-CM | POA: Diagnosis present

## 2018-10-30 DIAGNOSIS — K219 Gastro-esophageal reflux disease without esophagitis: Secondary | ICD-10-CM | POA: Diagnosis not present

## 2018-10-30 DIAGNOSIS — R200 Anesthesia of skin: Secondary | ICD-10-CM | POA: Diagnosis not present

## 2018-10-30 DIAGNOSIS — G8929 Other chronic pain: Secondary | ICD-10-CM

## 2018-10-30 DIAGNOSIS — D5720 Sickle-cell/Hb-C disease without crisis: Secondary | ICD-10-CM

## 2018-10-30 DIAGNOSIS — D57219 Sickle-cell/Hb-C disease with crisis, unspecified: Secondary | ICD-10-CM

## 2018-10-30 DIAGNOSIS — R202 Paresthesia of skin: Secondary | ICD-10-CM

## 2018-10-30 MED ORDER — PANTOPRAZOLE SODIUM 40 MG PO TBEC: 40.0000 mg | DELAYED_RELEASE_TABLET | Freq: Every day | ORAL | 3 refills | Status: DC

## 2018-10-30 MED ORDER — METHYLPREDNISOLONE 4 MG PO TBPK: ORAL_TABLET | ORAL | 0 refills | Status: DC

## 2018-10-30 MED ORDER — OXYCODONE HCL 15 MG PO TABS: 15.0000 mg | ORAL_TABLET | Freq: Four times a day (QID) | ORAL | 0 refills | Status: DC | PRN

## 2018-10-30 MED ORDER — CYCLOBENZAPRINE HCL 10 MG PO TABS: 10.0000 mg | ORAL_TABLET | Freq: Every day | ORAL | 0 refills | Status: DC

## 2018-10-30 NOTE — Patient Instructions (Signed)
Neuropathic Pain Neuropathic pain is pain caused by damage to the nerves that are responsible for certain sensations in your body (sensory nerves). The pain can be caused by damage to:  The sensory nerves that send signals to your spinal cord and brain (peripheral nervous system).  The sensory nerves in your brain or spinal cord (central nervous system).  Neuropathic pain can make you more sensitive to pain. What would be a minor sensation for most people may feel very painful if you have neuropathic pain. This is usually a long-term condition that can be difficult to treat. The type of pain can differ from person to person. It may start suddenly (acute), or it may develop slowly and last for a long time (chronic). Neuropathic pain may come and go as damaged nerves heal or may stay at the same level for years. It often causes emotional distress, loss of sleep, and a lower quality of life. What are the causes? The most common cause of damage to a sensory nerve is diabetes. Many other diseases and conditions can also cause neuropathic pain. Causes of neuropathic pain can be classified as:  Toxic. Many drugs and chemicals can cause toxic damage. The most common cause of toxic neuropathic pain is damage from drug treatment for cancer (chemotherapy).  Metabolic. This type of pain can happen when a disease causes imbalances that damage nerves. Diabetes is the most common of these diseases. Vitamin B deficiency caused by long-term alcohol abuse is another common cause.  Traumatic. Any injury that cuts, crushes, or stretches a nerve can cause damage and pain. A common example is feeling pain after losing an arm or leg (phantom limb pain).  Compression-related. If a sensory nerve gets trapped or compressed for a long period of time, the blood supply to the nerve can be cut off.  Vascular. Many blood vessel diseases can cause neuropathic pain by decreasing blood supply and oxygen to nerves.  Autoimmune.  This type of pain results from diseases in which the body's defense system mistakenly attacks sensory nerves. Examples of autoimmune diseases that can cause neuropathic pain include lupus and multiple sclerosis.  Infectious. Many types of viral infections can damage sensory nerves and cause pain. Shingles infection is a common cause of this type of pain.  Inherited. Neuropathic pain can be a symptom of many diseases that are passed down through families (genetic).  What are the signs or symptoms? The main symptom is pain. Neuropathic pain is often described as:  Burning.  Shock-like.  Stinging.  Hot or cold.  Itching.  How is this diagnosed? No single test can diagnose neuropathic pain. Your health care provider will do a physical exam and ask you about your pain. You may use a pain scale to describe how bad your pain is. You may also have tests to see if you have a high sensitivity to pain and to help find the cause and location of any sensory nerve damage. These tests may include:  Imaging studies, such as: ? X-rays. ? CT scan. ? MRI.  Nerve conduction studies to test how well nerve signals travel through your sensory nerves (electrodiagnostic testing).  Stimulating your sensory nerves through electrodes on your skin and measuring the response in your spinal cord and brain (somatosensory evoked potentials).  How is this treated? Treatment for neuropathic pain may change over time. You may need to try different treatment options or a combination of treatments. Some options include:  Over-the-counter pain relievers.  Prescription medicines. Some medicines   used to treat other conditions may also help neuropathic pain. These include medicines to: ? Control seizures (anticonvulsants). ? Relieve depression (antidepressants).  Prescription-strength pain relievers (narcotics). These are usually used when other pain relievers do not help.  Transcutaneous nerve stimulation (TENS).  This uses electrical currents to block painful nerve signals. The treatment is painless.  Topical and local anesthetics. These are medicines that numb the nerves. They can be injected as a nerve block or applied to the skin.  Alternative treatments, such as: ? Acupuncture. ? Meditation. ? Massage. ? Physical therapy. ? Pain management programs. ? Counseling.  Follow these instructions at home:  Learn as much as you can about your condition.  Take medicines only as directed by your health care provider.  Work closely with all your health care providers to find what works best for you.  Have a good support system at home.  Consider joining a chronic pain support group. Contact a health care provider if:  Your pain treatments are not helping.  You are having side effects from your medicines.  You are struggling with fatigue, mood changes, depression, or anxiety. This information is not intended to replace advice given to you by your health care provider. Make sure you discuss any questions you have with your health care provider. Document Released: 09/02/2004 Document Revised: 06/25/2016 Document Reviewed: 05/16/2014 Elsevier Interactive Patient Education  2018 Elsevier Inc.  

## 2018-10-30 NOTE — Progress Notes (Signed)
[facility] SICK[facility] CELL CARE  SICKLE CELL ANEMIA FOLLOW UP VISIT PROVIDER: Mike Gip, FNP    Subjective:   Hannah Hawkins  is a 32 y.o.  female who  has a past medical history of Sickle cell anemia (HCC). presents for a follow up for Sickle Cell Anemia. her last hospitalization was 10/16/2018  Pain regimen includes: Ibuprofen and Roxicodone Hydrea Therapy: Yes Medication compliance: Yes  Pain today is 10/10 The patient reports adequate daily hydration.   Patient states that she has a knot in her upper left arm. Patient states that she thinks that the knot has increased in size. States that she is having numbness in the left hand and 5th digit. Patient states that she also has pain in her neck and upper back. States that she had injections to the area in the past. States that she has been seen in the past by chiropractors with mild relief.  Patient states that she ran out of medication because "I had a friend over and he took them". Patient states that she has been taking ibuprofen with mild relief. She states that she is having difficluty with sleeping.  Review of Systems  Constitutional: Negative.   HENT: Negative.   Eyes: Negative.   Respiratory: Negative.   Cardiovascular: Negative.   Gastrointestinal: Negative.   Genitourinary: Negative.   Musculoskeletal: Positive for back pain, myalgias and neck pain.  Skin: Negative.   Neurological: Positive for tingling (left hand intermittently).  Psychiatric/Behavioral: Negative.     Objective:   Objective  BP 112/71 (BP Location: Left Arm, Patient Position: Sitting, Cuff Size: Normal)   Pulse 78   Temp 98.1 F (36.7 C) (Oral)   Resp 16   Ht 5\' 2"  (1.575 m)   Wt 146 lb (66.2 kg)   LMP 10/04/2018   SpO2 100%   BMI 26.70 kg/m    Physical Exam  Constitutional: She is oriented to person, place, and time. She appears well-developed and well-nourished. No distress.  HENT:  Head:  Normocephalic and atraumatic.  Eyes: Pupils are equal, round, and reactive to light. Conjunctivae and EOM are normal.  Neck: Normal range of motion.  Cardiovascular: Normal rate, regular rhythm, normal heart sounds and intact distal pulses.  Pulmonary/Chest: Effort normal and breath sounds normal. No respiratory distress.  Musculoskeletal: Normal range of motion.       Cervical back: She exhibits pain and spasm (right side).  Neurological: She is alert and oriented to person, place, and time.  Skin: Skin is warm and dry.  Psychiatric: She has a normal mood and affect. Her behavior is normal. Judgment and thought content normal.  Nursing note and vitals reviewed.    Assessment/Plan:   Assessment   Encounter Diagnoses  Name Primary?  . Chronic right-sided thoracic back pain Yes  . Numbness and tingling of left upper extremity   . Sickle cell disease, type [place], with unspecified crisis (HCC)   . Gastroesophageal reflux disease without esophagitis   . Sickle cell-hemoglobin C disease without crisis (HCC)      Plan    Chronic right-sided thoracic back pain Muscle relaxer and imaging ordered. Medrol for inflammation.  - cyclobenzaprine (FLEXERIL) 10 MG tablet; Take 1 tablet (10 mg total) by mouth at bedtime.  Dispense: 30 tablet; Refill: 0 - DG Cervical Spine Complete; Future - DG Thoracic Spine 2 View; Future -medrol dose pack.    Numbness and tingling of left upper extremity Nerve conduction and evaluation.  - Ambulatory  referral to Neurology   Gastroesophageal reflux disease without esophagitis Restart PPI - pantoprazole (PROTONIX) 40 MG tablet; Take 1 tablet (40 mg total) by mouth daily.  Dispense: 30 tablet; Refill: 3  Sickle cell-hemoglobin C disease without crisis (HCC) No medication changes warranted at the present time.  This provider will not refill her medication early. Discussed the pain contract that she signed.  - oxyCODONE (ROXICODONE) 15 MG immediate release  tablet; Take 1 tablet (15 mg total) by mouth every 6 (six) hours as needed for up to 15 days for pain.  Dispense: 60 tablet; Refill: 0   Return to care as scheduled and prn. Patient verbalized understanding and agreed with plan of care.   1. Sickle cell disease -  We discussed the need for good hydration, monitoring of hydration status, avoidance of heat, cold, stress, and infection triggers. We discussed the risks and benefits of Hydrea, including bone marrow suppression, the possibility of GI upset, skin ulcers, hair thinning, and teratogenicity. The patient was reminded of the need to seek medical attention of any symptoms of bleeding, anemia, or infection. Continue folic acid 1 mg daily to prevent aplastic bone marrow crises.   2. Pulmonary evaluation - Patient denies severe recurrent wheezes, shortness of breath with exercise, or persistent cough. If these symptoms develop, pulmonary function tests with spirometry will be ordered, and if abnormal, plan on referral to Pulmonology for further evaluation.  3. Cardiac - Routine screening for pulmonary hypertension is not recommended.  4. Eye - High risk of proliferative retinopathy. Annual eye exam with retinal exam recommended to patient.  5. Immunization status -  Yearly influenza vaccination is recommended, as well as being up to date with Meningococcal and Pneumococcal vaccines.   6. Acute and chronic painful episodes - We discussed that pt is to receive Schedule II prescriptions only from [place]. Pt is also aware that the prescription history is available to [place] online through the [facility] CSRS. Controlled substance agreement signed. We reminded Hannah Hawkins that all patients receiving Schedule II narcotics must be seen for follow within one month of prescription being requested. We reviewed the terms of our pain agreement, including the need to keep medicines in a safe locked location away from children or pets, and the need to report excess sedation  or constipation, measures to avoid constipation, and policies related to early refills and stolen prescriptions. According to the [place] [facility] program, we have reviewed details related to analgesia, adverse effects, aberrant behaviors.  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  Not applicable at this time.  If this occurs will use Exjade for management.   8. Vitamin D deficiency - Drisdol 50,000 units weekly. Patient encouraged to take as prescribed.   The above recommendations are taken from the NIH Evidence-Based Management of Sickle Cell Disease: Expert Panel Report, 16109.   Ms. Hannah L. Riley Lam, FNP-BC [facility] [address] [place], [place] [address] [phone]  This note has been created with Dragon speech recognition software and smart phrase technology. Any transcriptional errors are unintentional.

## 2018-10-31 ENCOUNTER — Encounter: Payer: Self-pay | Admitting: Neurology

## 2018-11-03 ENCOUNTER — Ambulatory Visit: Payer: Medicaid Other | Admitting: Family Medicine

## 2018-11-14 ENCOUNTER — Telehealth: Payer: Self-pay

## 2018-11-14 DIAGNOSIS — D5720 Sickle-cell/Hb-C disease without crisis: Secondary | ICD-10-CM

## 2018-11-14 NOTE — Telephone Encounter (Signed)
Refill request for oxycodone 15mg . Last written on 10/30/2018. Please advise. Thanks!

## 2018-11-15 MED ORDER — OXYCODONE HCL 15 MG PO TABS: 15.0000 mg | ORAL_TABLET | Freq: Four times a day (QID) | ORAL | 0 refills | Status: DC | PRN

## 2018-11-21 ENCOUNTER — Telehealth (HOSPITAL_COMMUNITY): Payer: Self-pay | Admitting: General Practice

## 2018-11-21 ENCOUNTER — Encounter (HOSPITAL_COMMUNITY): Payer: Self-pay | Admitting: General Practice

## 2018-11-21 ENCOUNTER — Non-Acute Institutional Stay (HOSPITAL_COMMUNITY)
Admission: AD | Admit: 2018-11-21 | Discharge: 2018-11-21 | Disposition: A | Discharge status: Home / Self Care | Payer: Medicaid Other | Source: Ambulatory Visit | Attending: Internal Medicine | Admitting: Internal Medicine

## 2018-11-21 DIAGNOSIS — D5700 Hb-SS disease with crisis, unspecified: Secondary | ICD-10-CM | POA: Diagnosis present

## 2018-11-21 DIAGNOSIS — Z87891 Personal history of nicotine dependence: Secondary | ICD-10-CM | POA: Insufficient documentation

## 2018-11-21 DIAGNOSIS — Z79899 Other long term (current) drug therapy: Secondary | ICD-10-CM | POA: Diagnosis not present

## 2018-11-21 DIAGNOSIS — Z880 Allergy status to penicillin: Secondary | ICD-10-CM | POA: Diagnosis not present

## 2018-11-21 DIAGNOSIS — G894 Chronic pain syndrome: Secondary | ICD-10-CM | POA: Insufficient documentation

## 2018-11-21 LAB — CBC WITH DIFFERENTIAL/PLATELET
Abs Immature Granulocytes: 0.02 10*3/uL (ref 0.00–0.07)
BASOS ABS: 0.1 10*3/uL (ref 0.0–0.1)
Basophils Relative: 1 %
Eosinophils Absolute: 0.7 10*3/uL — ABNORMAL HIGH (ref 0.0–0.5)
Eosinophils Relative: 9 %
HEMATOCRIT: 31.3 % — AB (ref 36.0–46.0)
Hemoglobin: 11.2 g/dL — ABNORMAL LOW (ref 12.0–15.0)
IMMATURE GRANULOCYTES: 0 %
Lymphocytes Relative: 44 %
Lymphs Abs: 3.5 10*3/uL (ref 0.7–4.0)
MCH: 28.5 pg (ref 26.0–34.0)
MCHC: 35.8 g/dL (ref 30.0–36.0)
MCV: 79.6 fL — AB (ref 80.0–100.0)
Monocytes Absolute: 0.8 10*3/uL (ref 0.1–1.0)
Monocytes Relative: 10 %
NEUTROS ABS: 2.9 10*3/uL (ref 1.7–7.7)
NEUTROS PCT: 36 %
NRBC: 6.4 % — AB (ref 0.0–0.2)
Platelets: 257 10*3/uL (ref 150–400)
RBC: 3.93 MIL/uL (ref 3.87–5.11)
RDW: 15.2 % (ref 11.5–15.5)
WBC: 8 10*3/uL (ref 4.0–10.5)

## 2018-11-21 LAB — COMPREHENSIVE METABOLIC PANEL
ALBUMIN: 4.2 g/dL (ref 3.5–5.0)
ALT: 32 U/L (ref 0–44)
AST: 28 U/L (ref 15–41)
Alkaline Phosphatase: 45 U/L (ref 38–126)
Anion gap: 6 (ref 5–15)
BILIRUBIN TOTAL: 1 mg/dL (ref 0.3–1.2)
BUN: 8 mg/dL (ref 6–20)
CO2: 28 mmol/L (ref 22–32)
CREATININE: 0.7 mg/dL (ref 0.44–1.00)
Calcium: 9.4 mg/dL (ref 8.9–10.3)
Chloride: 105 mmol/L (ref 98–111)
GFR calc Af Amer: >60 mL/min (ref >60)
GFR calc non Af Amer: >60 mL/min (ref >60)
Glucose, Bld: 110 mg/dL — ABNORMAL HIGH (ref 70–99)
POTASSIUM: 3.4 mmol/L — AB (ref 3.5–5.1)
Sodium: 139 mmol/L (ref 135–145)
TOTAL PROTEIN: 6.9 g/dL (ref 6.5–8.1)

## 2018-11-21 LAB — RETICULOCYTES
IMMATURE RETIC FRACT: 36.2 % — AB (ref 2.3–15.9)
RBC.: 3.93 MIL/uL (ref 3.87–5.11)
Retic Count, Absolute: 182 10*3/uL (ref 19.0–186.0)
Retic Ct Pct: 4.6 % — ABNORMAL HIGH (ref 0.4–3.1)

## 2018-11-21 MED ORDER — HYDROMORPHONE HCL 2 MG/ML IJ SOLN
2.0000 mg | Freq: Once | INTRAMUSCULAR | Status: AC
  Administered 2018-11-21: 2 mg via INTRAVENOUS
  Filled 2018-11-21: qty 1

## 2018-11-21 MED ORDER — OXYCODONE HCL 5 MG PO TABS
5.0000 mg | ORAL_TABLET | Freq: Once | ORAL | Status: AC
  Administered 2018-11-21: 5 mg via ORAL
  Filled 2018-11-21: qty 1

## 2018-11-21 MED ORDER — HYDROMORPHONE HCL 2 MG/ML IJ SOLN: 2.0000 mg | Freq: Once | INTRAMUSCULAR | Status: DC

## 2018-11-21 MED ORDER — KETOROLAC TROMETHAMINE 30 MG/ML IJ SOLN
30.0000 mg | Freq: Once | INTRAMUSCULAR | Status: AC
  Administered 2018-11-21: 30 mg via INTRAVENOUS
  Filled 2018-11-21: qty 1

## 2018-11-21 MED ORDER — ACETAMINOPHEN 325 MG PO TABS
650.0000 mg | ORAL_TABLET | Freq: Once | ORAL | Status: AC
  Administered 2018-11-21: 650 mg via ORAL
  Filled 2018-11-21: qty 2

## 2018-11-21 MED ORDER — DEXTROSE-NACL 5-0.45 % IV SOLN
INTRAVENOUS | Status: DC
  Administered 2018-11-21: 13:00:00 via INTRAVENOUS

## 2018-11-21 MED ORDER — ONDANSETRON HCL 4 MG/2ML IJ SOLN
4.0000 mg | Freq: Once | INTRAMUSCULAR | Status: AC
  Administered 2018-11-21: 4 mg via INTRAVENOUS
  Filled 2018-11-21: qty 2

## 2018-11-21 NOTE — Progress Notes (Signed)
Patient admitted to day hospital for treatment of sickle cell pain crisis. Patient reported pain rated 10/10 in the neck and back . Patient given IV Toradol, PO Oxycodone, PO Tylenol, IV Dilaudid, IV Zofran and hydrated with IV fluids. At discharge patient reported her pain at 6/10. Discharge instructions given to patient. Patient alert, oriented and ambulatory at discharge. Patient will be transported home by a "lift".

## 2018-11-21 NOTE — Discharge Instructions (Signed)
Resume all home medication Recommend 64 ounces of fluid per day Avoid all stressors that precipitate sickle cell crisis. Strict return precautions, called patient care center for triage in a.m. [phone] if pain persist.  If pain recurs overnight, recommend that [facility]  Sickle Cell Anemia, Adult Sickle cell anemia is a condition where your red blood cells are shaped like sickles. Red blood cells carry oxygen through the body. Sickle-shaped red blood cells do not live as long as normal red blood cells. They also clump together and block blood from flowing through the blood vessels. These things prevent the body from getting enough oxygen. Sickle cell anemia causes organ damage and pain. It also increases the risk of infection. Follow these instructions at home:  Drink enough fluid to keep your pee (urine) clear or pale yellow. Drink more in hot weather and during exercise.  Do not smoke. Smoking lowers oxygen levels in the blood.  Only take over-the-counter or prescription medicines as told by your doctor.  Take antibiotic medicines as told by your doctor. Make sure you finish them even if you start to feel better.  Take supplements as told by your doctor.  Consider wearing a medical alert bracelet. This tells anyone caring for you in an emergency of your condition.  When traveling, keep your medical information, doctors' names, and the medicines you take with you at all times.  If you have a fever, do not take fever medicines right away. This could cover up a problem. Tell your doctor.  Keep all follow-up visits with your doctor. Sickle cell anemia requires regular medical care. Contact a doctor if: You have a fever. Get help right away if:  You feel dizzy or faint.  You have new belly (abdominal) pain, especially on the left side near the stomach area.  You have a lasting, often uncomfortable and painful erection of the  penis (priapism). If it is not treated right away, you will become unable to have sex (impotence).  You have numbness in your arms or legs or you have a hard time moving them.  You have a hard time talking.  You have a fever or lasting symptoms for more than 2-3 days.  You have a fever and your symptoms suddenly get worse.  You have signs or symptoms of infection. These include: ? Chills. ? Being more tired than normal (lethargy). ? Irritability. ? Poor eating. ? Throwing up (vomiting).  You have pain that is not helped with medicine.  You have shortness of breath.  You have pain in your chest.  You are coughing up pus-like or bloody mucus.  You have a stiff neck.  Your feet or hands swell or have pain.  Your belly looks bloated.  Your joints hurt. This information is not intended to replace advice given to you by your health care provider. Make sure you discuss any questions you have with your health care provider. Document Released: 09/26/2013 Document Revised: 05/13/2016 Document Reviewed: 07/18/2013 Elsevier Interactive Patient Education  2017 [employer]

## 2018-11-21 NOTE — H&P (Signed)
[facility] History and Physical   Date: 11/21/2018  Patient name: Hannah Hawkins Medical record number: [id number] Date of birth: 05/02/1986 Age: 32 y.o. Gender: female PCP: Lanae Boast, [place]  Attending physician: Tresa Garter, MD  Chief Complaint: Right neck and shoulder pain  History of Present Illness: Hannah Hawkins, a 32 year old female with a medical history significant for sickle cell anemia and chronic pain syndrome presents complaining of pain primarily to right neck and right shoulder.  Current pain is different from typical sickle cell crisis.  Patient states that pain started 3 days ago.  She has been attempting to manage crisis at home with oxycodone 10 mg and ibuprofen without sustained relief.  Current pain intensity is 10 out of 10 characterized as constant and throbbing.  Patient states that pain is worsened with movement.  She also endorses headache.  She denies chest pain, dyspnea, nausea, vomiting, or diarrhea.  Meds: Medications Prior to Admission  Medication Sig Dispense Refill Last Dose  . cyclobenzaprine (FLEXERIL) 10 MG tablet Take 1 tablet (10 mg total) by mouth at bedtime. 30 tablet 0   . ergocalciferol (VITAMIN D2) 50000 units capsule Take 1 capsule (50,000 Units total) by mouth once a week. (Patient not taking: Reported on 08/17/2018) 12 capsule 3 Not Taking  . folic acid (FOLVITE) 1 MG tablet Take 1 tablet (1 mg total) by mouth daily. (Patient not taking: Reported on 02/28/2018) 30 tablet 2 Not Taking  . hydroxyurea (HYDREA) 500 MG capsule Take 2 capsules (1,000 mg total) by mouth daily. 60 capsule 2 Taking  . ibuprofen (ADVIL,MOTRIN) 800 MG tablet Take 1 tablet (800 mg total) by mouth every 8 (eight) hours as needed for moderate pain. 30 tablet 5 Taking  . levocetirizine (XYZAL) 5 MG tablet TAKE 1 TABLET(5 MG) BY MOUTH EVERY EVENING 90 tablet 0 Taking  . methylPREDNISolone (MEDROL DOSEPAK) 4 MG TBPK tablet As directed on pack. 21  tablet 0   . Multiple Vitamin (MULTIVITAMIN WITH MINERALS) TABS tablet Take 1 tablet by mouth daily.   Taking  . NARCAN 4 MG/0.1ML LIQD nasal spray kit Place 4 mg into the nose once as needed. overdose  0 Taking  . oxyCODONE (ROXICODONE) 15 MG immediate release tablet Take 1 tablet (15 mg total) by mouth every 6 (six) hours as needed for up to 15 days for pain. 60 tablet 0   . pantoprazole (PROTONIX) 40 MG tablet Take 1 tablet (40 mg total) by mouth daily. 30 tablet 3     Allergies: Penicillins Past Medical History:  Diagnosis Date  . Sickle cell anemia (HCC)    Past Surgical History:  Procedure Laterality Date  . EYE SURGERY  03/09/2017   left eye   No family history on file. Social History   Socioeconomic History  . Marital status: Single    Spouse name: Not on file  . Number of children: Not on file  . Years of education: Not on file  . Highest education level: Not on file  Occupational History  . Not on file  Social Needs  . Financial resource strain: Not on file  . Food insecurity:    Worry: Not on file    Inability: Not on file  . Transportation needs:    Medical: Not on file    Non-medical: Not on file  Tobacco Use  . Smoking status: Former Research scientist (life sciences)  . Smokeless tobacco: Never Used  Substance and Sexual Activity  . Alcohol use: Yes    Comment:  Twice a month  . Drug use: No  . Sexual activity: Yes  Lifestyle  . Physical activity:    Days per week: Not on file    Minutes per session: Not on file  . Stress: Not on file  Relationships  . Social connections:    Talks on phone: Not on file    Gets together: Not on file    Attends religious service: Not on file    Active member of club or organization: Not on file    Attends meetings of clubs or organizations: Not on file    Relationship status: Not on file  . Intimate partner violence:    Fear of current or ex partner: Not on file    Emotionally abused: Not on file    Physically abused: Not on file     Forced sexual activity: Not on file  Other Topics Concern  . Not on file  Social History Narrative  . Not on file  Review of Systems  Constitutional: Negative.   HENT: Negative.   Eyes: Negative.   Respiratory: Negative.   Cardiovascular: Negative.   Gastrointestinal: Negative.   Genitourinary: Negative for dysuria.  Musculoskeletal: Positive for back pain, joint pain (right shoulder pain) and neck pain.  Skin: Negative.     Physical Exam: There were no vitals taken for this visit. Physical Exam  Constitutional: She is oriented to person, place, and time. She appears well-developed and well-nourished. She appears distressed.  Eyes: Pupils are equal, round, and reactive to light.  Neck: Neck rigidity present. Decreased range of motion present. No edema present.  Cardiovascular: Normal rate, regular rhythm and normal heart sounds.  Pulmonary/Chest: Effort normal and breath sounds normal.  Abdominal: Soft. Bowel sounds are normal.  Musculoskeletal:       Right shoulder: She exhibits decreased range of motion, pain and decreased strength. She exhibits no bony tenderness and no swelling.  Neurological: She is alert and oriented to person, place, and time.  Skin: Skin is warm and dry.  Psychiatric: She has a normal mood and affect. Her behavior is normal. Judgment and thought content normal.     Lab results: No results found for this or any previous visit (from the past 24 hour(s)).  Imaging results:  No results found.   Assessment & Plan:  Patient will be admitted to the day infusion center for extended observation  Start IV D5.45 for cellular rehydration at 150/hr  Start Toradol 30 mg IV times one for inflammation.  Dilaudid 2 mg times one  Patient will be re-evaluated for pain intensity in the context of function and relationship to baseline as care progresses.  If no significant pain relief, will transfer patient to inpatient services for a higher level of care.    Will check CMP, reticulocytes and CBC w/differential   11/21/2018, 1:14 PM

## 2018-11-21 NOTE — Discharge Summary (Signed)
[facility] Discharge Summary   Patient ID: Hannah Hawkins MRN: [id number] DOB/AGE: November 26, 1986 32 y.o.  Admit date: 11/21/2018 Discharge date: 11/21/2018  Primary Care Physician:  Lanae Boast, [place]  Admission Diagnoses:  Active Problems:   Sickle cell pain crisis [facility])  Discharge Medications:  Allergies as of 11/21/2018      Reactions   Penicillins Swelling, Rash   Has patient had a PCN reaction causing immediate rash, facial/tongue/throat swelling, SOB or lightheadedness with hypotension: Yes Has patient had a PCN reaction causing severe rash involving mucus membranes or skin necrosis: Yes Has patient had a PCN reaction that required hospitalization No Has patient had a PCN reaction occurring within the last 10 years: no If all of the above answers are "NO", then may proceed with Cephalosporin use.      Medication List    TAKE these medications   cyclobenzaprine 10 MG tablet Commonly known as:  FLEXERIL Take 1 tablet (10 mg total) by mouth at bedtime.   ergocalciferol 1.25 MG (50000 UT) capsule Commonly known as:  VITAMIN D2 Take 1 capsule (50,000 Units total) by mouth once a week.   folic acid 1 MG tablet Commonly known as:  FOLVITE Take 1 tablet (1 mg total) by mouth daily.   hydroxyurea 500 MG capsule Commonly known as:  HYDREA Take 2 capsules (1,000 mg total) by mouth daily.   ibuprofen 800 MG tablet Commonly known as:  ADVIL,MOTRIN Take 1 tablet (800 mg total) by mouth every 8 (eight) hours as needed for moderate pain.   levocetirizine 5 MG tablet Commonly known as:  XYZAL TAKE 1 TABLET(5 MG) BY MOUTH EVERY EVENING   methylPREDNISolone 4 MG Tbpk tablet Commonly known as:  MEDROL DOSEPAK As directed on pack.   multivitamin with minerals Tabs tablet Take 1 tablet by mouth daily.   NARCAN 4 MG/0.1ML Liqd nasal spray kit Generic drug:  naloxone Place 4 mg into the nose once as needed. overdose   oxyCODONE 15 MG immediate release  tablet Commonly known as:  ROXICODONE Take 1 tablet (15 mg total) by mouth every 6 (six) hours as needed for up to 15 days for pain.   pantoprazole 40 MG tablet Commonly known as:  PROTONIX Take 1 tablet (40 mg total) by mouth daily.        Consults:  None  Significant Diagnostic Studies:  Dg Cervical Spine Complete  Result Date: 10/30/2018 CLINICAL DATA:  Neck pain radiating to right shoulder EXAM: CERVICAL SPINE - COMPLETE 4+ VIEW COMPARISON:  None. FINDINGS: There is no evidence of cervical spine fracture or prevertebral soft tissue swelling. Alignment is normal. No other significant bone abnormalities are identified. Minimal spurring C4-C5. IMPRESSION: No acute osseous abnormality. Electronically Signed   By: Donavan Foil M.D.   On: 10/30/2018 17:01   Dg Thoracic Spine 2 View  Result Date: 10/30/2018 CLINICAL DATA:  Right-sided back pain EXAM: THORACIC SPINE 2 VIEWS COMPARISON:  Chest x-ray 10/17/2018, 04/04/2018, CT 11/02/2017 FINDINGS: Minimal anterior wedging T11-T12 which is chronic. Remaining vertebral body heights demonstrate normal stature. Alignment normal. IMPRESSION: No acute osseous abnormality. Electronically Signed   By: Donavan Foil M.D.   On: 10/30/2018 16:59     [facility] Course: Hannah Hawkins, a 32 year old female with a history of sickle cell anemia was admitted to day infusion center for pain management and extended observation.   Hemoglobin 11.2, which is consistent with baseline. Hypotonic IV fluid initiated at 150 mL/h for cellular rehydration  Toradol 30 mg IV x1 Dilaudid 2 mg IV x2 Oxycodone 5 mg p.o. x1 Pain intensity decreased to 7/10.  Patient alert, oriented and ambulating.  Will discharge home with family in stable condition.      Discharge instructions:    Resume all home medications Continue to hydrate with 64 ounces of water.   The patient was given clear instructions to go to ER or return to medical center if  symptoms do not improve, worsen or new problems develop.     Physical Exam at Discharge:  BP (!) 126/95 (BP Location: Left Arm)   Pulse 65   Temp (!) 97.5 F (36.4 C)   SpO2 100%   Physical Exam  Constitutional: She is oriented to person, place, and time. She appears well-developed and well-nourished.  HENT:  Head: Normocephalic.  Eyes: Pupils are equal, round, and reactive to light.  Neck: Normal range of motion.  Cardiovascular: Normal rate, regular rhythm and normal heart sounds.  Pulmonary/Chest: Effort normal and breath sounds normal.  Abdominal: Soft. Bowel sounds are normal.  Musculoskeletal: Normal range of motion.  Neurological: She is alert and oriented to person, place, and time.  Skin: Skin is warm and dry.  Psychiatric: She has a normal mood and affect. Her behavior is normal. Judgment and thought content normal.   Disposition at Discharge:   Discharge Orders:   Condition at Discharge:   Stable  Time spent on Discharge:  20 minutes     [place], MSN, FNP-C Patient [place] [address] [place], [place] [address] [phone]  Signed:11/21/2018, 4:15 PM

## 2018-11-21 NOTE — Telephone Encounter (Signed)
Patient called, complained of pain in the neck, arms and back that she rated as 10/10. Denied chest pain, fever, diarrhea, abdominal pain, nausea/vomitting. Admitted to having means of transportation without driving self after treatment. Last took Oxycodone 15 mg and Ibuprofen 800 mg yesterday. Provider notified; per provider, patient can come to the day hospital for treatment. Patient notified, verbalized understanding.

## 2018-11-22 ENCOUNTER — Encounter (HOSPITAL_COMMUNITY): Payer: Self-pay | Admitting: General Practice

## 2018-11-22 ENCOUNTER — Telehealth (HOSPITAL_COMMUNITY): Payer: Self-pay | Admitting: General Practice

## 2018-11-22 ENCOUNTER — Non-Acute Institutional Stay (HOSPITAL_COMMUNITY)
Admission: AD | Admit: 2018-11-22 | Discharge: 2018-11-22 | Disposition: A | Discharge status: Home / Self Care | Payer: Medicaid Other | Source: Ambulatory Visit | Attending: Internal Medicine | Admitting: Internal Medicine

## 2018-11-22 DIAGNOSIS — Z880 Allergy status to penicillin: Secondary | ICD-10-CM | POA: Diagnosis not present

## 2018-11-22 DIAGNOSIS — M542 Cervicalgia: Secondary | ICD-10-CM | POA: Insufficient documentation

## 2018-11-22 DIAGNOSIS — G894 Chronic pain syndrome: Secondary | ICD-10-CM | POA: Insufficient documentation

## 2018-11-22 DIAGNOSIS — M25511 Pain in right shoulder: Secondary | ICD-10-CM | POA: Insufficient documentation

## 2018-11-22 DIAGNOSIS — D5700 Hb-SS disease with crisis, unspecified: Secondary | ICD-10-CM | POA: Insufficient documentation

## 2018-11-22 DIAGNOSIS — F1490 Cocaine use, unspecified, uncomplicated: Secondary | ICD-10-CM

## 2018-11-22 DIAGNOSIS — G8929 Other chronic pain: Secondary | ICD-10-CM

## 2018-11-22 DIAGNOSIS — Z791 Long term (current) use of non-steroidal anti-inflammatories (NSAID): Secondary | ICD-10-CM | POA: Diagnosis not present

## 2018-11-22 DIAGNOSIS — Z87891 Personal history of nicotine dependence: Secondary | ICD-10-CM | POA: Insufficient documentation

## 2018-11-22 DIAGNOSIS — M546 Pain in thoracic spine: Secondary | ICD-10-CM

## 2018-11-22 LAB — CBC
HCT: 28.8 % — ABNORMAL LOW (ref 36.0–46.0)
Hemoglobin: 10.4 g/dL — ABNORMAL LOW (ref 12.0–15.0)
MCH: 28.7 pg (ref 26.0–34.0)
MCHC: 36.1 g/dL — ABNORMAL HIGH (ref 30.0–36.0)
MCV: 79.6 fL — AB (ref 80.0–100.0)
Platelets: 259 10*3/uL (ref 150–400)
RBC: 3.62 MIL/uL — ABNORMAL LOW (ref 3.87–5.11)
RDW: 15.4 % (ref 11.5–15.5)
WBC: 7.2 10*3/uL (ref 4.0–10.5)
nRBC: 6.3 % — ABNORMAL HIGH (ref 0.0–0.2)

## 2018-11-22 LAB — BASIC METABOLIC PANEL
Anion gap: 10 (ref 5–15)
BUN: <5 mg/dL — ABNORMAL LOW (ref 6–20)
CALCIUM: 8.4 mg/dL — AB (ref 8.9–10.3)
CO2: 26 mmol/L (ref 22–32)
Chloride: 103 mmol/L (ref 98–111)
Creatinine, Ser: 0.74 mg/dL (ref 0.44–1.00)
GFR calc Af Amer: >60 mL/min (ref >60)
GFR calc non Af Amer: >60 mL/min (ref >60)
Glucose, Bld: 114 mg/dL — ABNORMAL HIGH (ref 70–99)
Potassium: 3.3 mmol/L — ABNORMAL LOW (ref 3.5–5.1)
Sodium: 139 mmol/L (ref 135–145)

## 2018-11-22 LAB — RAPID URINE DRUG SCREEN, HOSP PERFORMED
Amphetamines: NOT DETECTED
BARBITURATES: NOT DETECTED
Benzodiazepines: NOT DETECTED
COCAINE: POSITIVE — AB
Opiates: POSITIVE — AB
Tetrahydrocannabinol: NOT DETECTED

## 2018-11-22 LAB — PREGNANCY, URINE: Preg Test, Ur: NEGATIVE

## 2018-11-22 MED ORDER — NALOXONE HCL 0.4 MG/ML IJ SOLN: 0.4000 mg | INTRAMUSCULAR | Status: DC | PRN

## 2018-11-22 MED ORDER — OXYCODONE HCL 5 MG PO TABS
20.0000 mg | ORAL_TABLET | ORAL | Status: DC | PRN
  Administered 2018-11-22 (×2): 20 mg via ORAL
  Filled 2018-11-22 (×2): qty 4

## 2018-11-22 MED ORDER — ACETAMINOPHEN 500 MG PO TABS
1000.0000 mg | ORAL_TABLET | Freq: Once | ORAL | Status: AC
  Administered 2018-11-22: 1000 mg via ORAL
  Filled 2018-11-22: qty 2

## 2018-11-22 MED ORDER — SODIUM CHLORIDE 0.45 % IV SOLN
INTRAVENOUS | Status: DC
  Administered 2018-11-22: 12:00:00 via INTRAVENOUS

## 2018-11-22 MED ORDER — CYCLOBENZAPRINE HCL 10 MG PO TABS: 10.0000 mg | ORAL_TABLET | Freq: Three times a day (TID) | ORAL | 0 refills | Status: DC

## 2018-11-22 MED ORDER — DIPHENHYDRAMINE HCL 25 MG PO CAPS
25.0000 mg | ORAL_CAPSULE | ORAL | Status: DC | PRN
  Administered 2018-11-22: 25 mg via ORAL
  Filled 2018-11-22: qty 1

## 2018-11-22 MED ORDER — KETOROLAC TROMETHAMINE 30 MG/ML IJ SOLN
30.0000 mg | Freq: Once | INTRAMUSCULAR | Status: AC
  Administered 2018-11-22: 30 mg via INTRAVENOUS
  Filled 2018-11-22: qty 1

## 2018-11-22 MED ORDER — SODIUM CHLORIDE 0.9% FLUSH: 9.0000 mL | INTRAVENOUS | Status: DC | PRN

## 2018-11-22 MED ORDER — HYDROMORPHONE 1 MG/ML IV SOLN
INTRAVENOUS | Status: DC
  Administered 2018-11-22: 30 mg via INTRAVENOUS
  Administered 2018-11-22: 1 mg via INTRAVENOUS
  Filled 2018-11-22: qty 30

## 2018-11-22 MED ORDER — ONDANSETRON HCL 4 MG/2ML IJ SOLN
4.0000 mg | Freq: Four times a day (QID) | INTRAMUSCULAR | Status: DC | PRN
  Administered 2018-11-22: 4 mg via INTRAVENOUS
  Filled 2018-11-22: qty 2

## 2018-11-22 MED ORDER — PREDNISONE 20 MG PO TABS: 20.0000 mg | ORAL_TABLET | Freq: Every day | ORAL | 0 refills | Status: DC

## 2018-11-22 NOTE — Discharge Instructions (Addendum)
Resume all home medications. Due to right neck edema, will start a 5-day course of prednisone.  Take 20 mg daily with food. Also, continue cyclobenzaprine 10 mg every 8 hours as needed.  Do not drink alcohol, drive, or operate machinery while taking this medication. Please schedule follow-up with primary care provider to discuss pain management options.   [facility] [address] [place] [phone]     Sickle Cell Anemia, Adult Sickle cell anemia is a condition where your red blood cells are shaped like sickles. Red blood cells carry oxygen through the body. Sickle-shaped red blood cells do not live as long as normal red blood cells. They also clump together and block blood from flowing through the blood vessels. These things prevent the body from getting enough oxygen. Sickle cell anemia causes organ damage and pain. It also increases the risk of infection. Follow these instructions at home:  Drink enough fluid to keep your pee (urine) clear or pale yellow. Drink more in hot weather and during exercise.  Do not smoke. Smoking lowers oxygen levels in the blood.  Only take over-the-counter or prescription medicines as told by your doctor.  Take antibiotic medicines as told by your doctor. Make sure you finish them even if you start to feel better.  Take supplements as told by your doctor.  Consider wearing a medical alert bracelet. This tells anyone caring for you in an emergency of your condition.  When traveling, keep your medical information, doctors' names, and the medicines you take with you at all times.  If you have a fever, do not take fever medicines right away. This could cover up a problem. Tell your doctor.  Keep all follow-up visits with your doctor. Sickle cell anemia requires regular medical care. Contact a doctor if: You have a fever. Get help right away if:  You feel dizzy or faint.  You have new belly (abdominal) pain, especially  on the left side near the stomach area.  You have a lasting, often uncomfortable and painful erection of the penis (priapism). If it is not treated right away, you will become unable to have sex (impotence).  You have numbness in your arms or legs or you have a hard time moving them.  You have a hard time talking.  You have a fever or lasting symptoms for more than 2-3 days.  You have a fever and your symptoms suddenly get worse.  You have signs or symptoms of infection. These include: ? Chills. ? Being more tired than normal (lethargy). ? Irritability. ? Poor eating. ? Throwing up (vomiting).  You have pain that is not helped with medicine.  You have shortness of breath.  You have pain in your chest.  You are coughing up pus-like or bloody mucus.  You have a stiff neck.  Your feet or hands swell or have pain.  Your belly looks bloated.  Your joints hurt. This information is not intended to replace advice given to you by your health care provider. Make sure you discuss any questions you have with your health care provider. Document Released: 09/26/2013 Document Revised: 05/13/2016 Document Reviewed: 07/18/2013 Elsevier Interactive Patient Education  2017 [employer]   Hypokalemia Hypokalemia means that the amount of potassium in the blood is lower than normal.Potassium is a chemical that helps regulate the amount of fluid in the body (electrolyte). It also stimulates muscle tightening (contraction) and helps nerves work properly.Normally, most of the bodys potassium is inside of cells, and only  a very small amount is in the blood. Because the amount in the blood is so small, minor changes to potassium levels in the blood can be life-threatening. What are the causes? This condition may be caused by:  Antibiotic medicine.  Diarrhea or vomiting. Taking too much of a medicine that helps you have a bowel movement (laxative) can cause diarrhea and lead to  hypokalemia.  Chronic kidney disease (CKD).  Medicines that help the body get rid of excess fluid (diuretics).  Eating disorders, such as bulimia.  Low magnesium levels in the body.  Sweating a lot.  What are the signs or symptoms? Symptoms of this condition include:  Weakness.  Constipation.  Fatigue.  Muscle cramps.  Mental confusion.  Skipped heartbeats or irregular heartbeat (palpitations).  Tingling or numbness.  How is this diagnosed? This condition is diagnosed with a blood test. How is this treated? Hypokalemia can be treated by taking potassium supplements by mouth or adjusting the medicines that you take. Treatment may also include eating more foods that contain a lot of potassium. If your potassium level is very low, you may need to get potassium through an IV tube in one of your veins and be monitored in the hospital. Follow these instructions at home:  Take over-the-counter and prescription medicines only as told by your health care provider. This includes vitamins and supplements.  Eat a healthy diet. A healthy diet includes fresh fruits and vegetables, whole grains, healthy fats, and lean proteins.  If instructed, eat more foods that contain a lot of potassium, such as: ? Nuts, such as peanuts and pistachios. ? Seeds, such as sunflower seeds and pumpkin seeds. ? Peas, lentils, and lima beans. ? Whole grain and bran cereals and breads. ? Fresh fruits and vegetables, such as apricots, avocado, bananas, cantaloupe, kiwi, oranges, tomatoes, asparagus, and potatoes. ? Orange juice. ? Tomato juice. ? Red meats. ? Yogurt.  Keep all follow-up visits as told by your health care provider. This is important. Contact a health care provider if:  You have weakness that gets worse.  You feel your heart pounding or racing.  You vomit.  You have diarrhea.  You have diabetes (diabetes mellitus) and you have trouble keeping your blood sugar (glucose) in your  target range. Get help right away if:  You have chest pain.  You have shortness of breath.  You have vomiting or diarrhea that lasts for more than 2 days.  You faint. This information is not intended to replace advice given to you by your health care provider. Make sure you discuss any questions you have with your health care provider. Document Released: 12/06/2005 Document Revised: 07/24/2016 Document Reviewed: 07/24/2016 Elsevier Interactive Patient Education  2018 [employer]

## 2018-11-22 NOTE — Discharge Summary (Signed)
[facility] Discharge Summary   Patient ID: Hannah Hawkins MRN: [id number] DOB/AGE: 03/14/1986 32 y.o.  Admit date: 11/22/2018 Discharge date: 11/22/2018  Primary Care Physician:  Hannah Hawkins, [place]  Admission Diagnoses:  Active Problems:   Sickle cell pain crisis [facility])  Discharge Medications:  Allergies as of 11/22/2018      Reactions   Penicillins Swelling, Rash   Has patient had a PCN reaction causing immediate rash, facial/tongue/throat swelling, SOB or lightheadedness with hypotension: Yes Has patient had a PCN reaction causing severe rash involving mucus membranes or skin necrosis: Yes Has patient had a PCN reaction that required hospitalization No Has patient had a PCN reaction occurring within the last 10 years: no If all of the above answers are "NO", then may proceed with Cephalosporin use.      Medication List    STOP taking these medications   methylPREDNISolone 4 MG Tbpk tablet Commonly known as:  MEDROL DOSEPAK     TAKE these medications   cyclobenzaprine 10 MG tablet Commonly known as:  FLEXERIL Take 1 tablet (10 mg total) by mouth 3 (three) times daily. What changed:  when to take this   ergocalciferol 1.25 MG (50000 UT) capsule Commonly known as:  VITAMIN D2 Take 1 capsule (50,000 Units total) by mouth once a week.   folic acid 1 MG tablet Commonly known as:  FOLVITE Take 1 tablet (1 mg total) by mouth daily.   hydroxyurea 500 MG capsule Commonly known as:  HYDREA Take 2 capsules (1,000 mg total) by mouth daily.   ibuprofen 800 MG tablet Commonly known as:  ADVIL,MOTRIN Take 1 tablet (800 mg total) by mouth every 8 (eight) hours as needed for moderate pain.   levocetirizine 5 MG tablet Commonly known as:  XYZAL TAKE 1 TABLET(5 MG) BY MOUTH EVERY EVENING   multivitamin with minerals Tabs tablet Take 1 tablet by mouth daily.   NARCAN 4 MG/0.1ML Liqd nasal spray kit Generic drug:  naloxone Place 4 mg into the nose once as  needed. overdose   oxyCODONE 15 MG immediate release tablet Commonly known as:  ROXICODONE Take 1 tablet (15 mg total) by mouth every 6 (six) hours as needed for up to 15 days for pain.   pantoprazole 40 MG tablet Commonly known as:  PROTONIX Take 1 tablet (40 mg total) by mouth daily.   predniSONE 20 MG tablet Commonly known as:  DELTASONE Take 1 tablet (20 mg total) by mouth daily.        Consults:  None  Significant Diagnostic Studies:  Dg Cervical Spine Complete  Result Date: 10/30/2018 CLINICAL DATA:  Neck pain radiating to right shoulder EXAM: CERVICAL SPINE - COMPLETE 4+ VIEW COMPARISON:  None. FINDINGS: There is no evidence of cervical spine fracture or prevertebral soft tissue swelling. Alignment is normal. No other significant bone abnormalities are identified. Minimal spurring C4-C5. IMPRESSION: No acute osseous abnormality. Electronically Signed   By: Donavan Foil M.D.   On: 10/30/2018 17:01   Dg Thoracic Spine 2 View  Result Date: 10/30/2018 CLINICAL DATA:  Right-sided back pain EXAM: THORACIC SPINE 2 VIEWS COMPARISON:  Chest x-ray 10/17/2018, 04/04/2018, CT 11/02/2017 FINDINGS: Minimal anterior wedging T11-T12 which is chronic. Remaining vertebral body heights demonstrate normal stature. Alignment normal. IMPRESSION: No acute osseous abnormality. Electronically Signed   By: Donavan Foil M.D.   On: 10/30/2018 16:59     [facility] Course: Hannah Hawkins, a 32 year old female with a history of sickle  cell anemia was admitted to day infusion center for pain management and extended observation.  Reviewed labs, potassium slightly decreased at 3.3.  Recommend a potassium rich diet, discussed food options.  Review urine drug screen, positive for cocaine.  Discussed drug treatment options at length. Patient refuses at this time. Given information for ADS.  [facility] [place]   Hemoglobin 10.4,  which is consistent with baseline.  IV fluid initiated at 125 mL/h for cellular rehydration Toradol 30 mg IV x1 PCA Dilaudid per weight based protocol. Patient used at total of 1 mg with 6 demands and 2 delivered. PCA discontinued following drug screen review.   Oxycodone 20 mg times 2 Pain intensity decreased to 6/10.  Patient alert, oriented and ambulating.  Will discharge home with family in stable condition.      Discharge instructions:   Prednisone 20 mg daily for 5 days for inflammation Flexeril 10 mg every 8 hours as needed.  Ibuprofen 800 mg every 8 hours as needed.  Tylenol 500 mg every 6 hours for mild to moderate pain.   Resume all home medications Follow up in primary care for pain management options  Continue to hydrate with 64 ounces of water.   The patient was given clear instructions to go to ER or return to medical center if symptoms do not improve, worsen or new problems develop.     Physical Exam at Discharge:  BP 109/74 (BP Location: Left Arm)   Pulse 77   Temp 99 F (37.2 C) (Oral)   Resp 16   SpO2 99%   Physical Exam  Constitutional: She is oriented to person, place, and time. She appears well-developed and well-nourished.  HENT:  Head: Normocephalic.  Eyes: Pupils are equal, round, and reactive to light.  Neck: Normal range of motion.  Cardiovascular: Normal rate, regular rhythm and normal heart sounds.  Pulmonary/Chest: Effort normal and breath sounds normal.  Abdominal: Soft. Bowel sounds are normal.  Musculoskeletal: Normal range of motion.  Neurological: She is alert and oriented to person, place, and time.  Skin: Skin is warm and dry.  Psychiatric: She has a normal mood and affect. Her behavior is normal. Judgment and thought content normal.   Disposition at Discharge: Discharge disposition: 01-Home or Self Care       Discharge Orders: Discharge Instructions    Discharge patient   Complete by:  As directed    Discharge  disposition:  01-Home or Self Care   Discharge patient date:  11/22/2018      Condition at Discharge:   Stable  Time spent on Discharge:  20 minutes     [place], MSN, FNP-C Patient [place] [address] [place], [place] [address] [phone]  Signed:11/22/2018, 4:10 PM

## 2018-11-22 NOTE — H&P (Signed)
[facility] History and Physical   Date: 11/22/2018  Patient name: Hannah Hawkins Medical record number: [id number] Date of birth: Sep 19, 1986 Age: 32 y.o. Gender: female PCP: Lanae Boast, [place]  Attending physician: Tresa Garter, MD  Chief Complaint: Right neck and shoulder pain  History of Present Illness: Hannah Hawkins, a 32 year old female with a medical history significant for sickle cell anemia and chronic pain syndrome presents complaining of pain primarily to right neck and right shoulder.  Patient was treated and evaluated in day infusion center on 11/21/2018. She says that pain intensity increased overnight. She last had oxycodone this am without sustained relief. Patient has also been taking Ibuprofen 800 mg every 8 hours. She rates pain as 9/10 characterized as constant and throbbing.    Patient states that pain is worsened with movement.  She also endorses headache.  She denies chest pain, dyspnea, nausea, vomiting, or diarrhea.  Meds: Medications Prior to Admission  Medication Sig Dispense Refill Last Dose  . cyclobenzaprine (FLEXERIL) 10 MG tablet Take 1 tablet (10 mg total) by mouth at bedtime. 30 tablet 0 More than a month at Unknown time  . ergocalciferol (VITAMIN D2) 50000 units capsule Take 1 capsule (50,000 Units total) by mouth once a week. (Patient not taking: Reported on 08/17/2018) 12 capsule 3 More than a month at Unknown time  . folic acid (FOLVITE) 1 MG tablet Take 1 tablet (1 mg total) by mouth daily. (Patient not taking: Reported on 02/28/2018) 30 tablet 2 More than a month at Unknown time  . hydroxyurea (HYDREA) 500 MG capsule Take 2 capsules (1,000 mg total) by mouth daily. 60 capsule 2 Past Week at Unknown time  . ibuprofen (ADVIL,MOTRIN) 800 MG tablet Take 1 tablet (800 mg total) by mouth every 8 (eight) hours as needed for moderate pain. 30 tablet 5 Past Week at Unknown time  . levocetirizine (XYZAL) 5 MG tablet TAKE 1 TABLET(5 MG) BY  MOUTH EVERY EVENING 90 tablet 0 More than a month at Unknown time  . methylPREDNISolone (MEDROL DOSEPAK) 4 MG TBPK tablet As directed on pack. 21 tablet 0 Unknown at Unknown time  . Multiple Vitamin (MULTIVITAMIN WITH MINERALS) TABS tablet Take 1 tablet by mouth daily.   Past Week at Unknown time  . NARCAN 4 MG/0.1ML LIQD nasal spray kit Place 4 mg into the nose once as needed. overdose  0 Taking  . oxyCODONE (ROXICODONE) 15 MG immediate release tablet Take 1 tablet (15 mg total) by mouth every 6 (six) hours as needed for up to 15 days for pain. 60 tablet 0 Past Week at Unknown time  . pantoprazole (PROTONIX) 40 MG tablet Take 1 tablet (40 mg total) by mouth daily. 30 tablet 3 11/20/2018 at Unknown time    Allergies: Penicillins Past Medical History:  Diagnosis Date  . Sickle cell anemia (HCC)    Past Surgical History:  Procedure Laterality Date  . EYE SURGERY  03/09/2017   left eye   No family history on file. Social History   Socioeconomic History  . Marital status: Single    Spouse name: Not on file  . Number of children: Not on file  . Years of education: Not on file  . Highest education level: Not on file  Occupational History  . Not on file  Social Needs  . Financial resource strain: Not on file  . Food insecurity:    Worry: Not on file    Inability: Not on file  . Transportation needs:  Medical: Not on file    Non-medical: Not on file  Tobacco Use  . Smoking status: Former Research scientist (life sciences)  . Smokeless tobacco: Never Used  Substance and Sexual Activity  . Alcohol use: Yes    Comment: Twice a month  . Drug use: No  . Sexual activity: Yes  Lifestyle  . Physical activity:    Days per week: Not on file    Minutes per session: Not on file  . Stress: Not on file  Relationships  . Social connections:    Talks on phone: Not on file    Gets together: Not on file    Attends religious service: Not on file    Active member of club or organization: Not on file    Attends  meetings of clubs or organizations: Not on file    Relationship status: Not on file  . Intimate partner violence:    Fear of current or ex partner: Not on file    Emotionally abused: Not on file    Physically abused: Not on file    Forced sexual activity: Not on file  Other Topics Concern  . Not on file  Social History Narrative  . Not on file  Review of Systems  Constitutional: Negative.   HENT: Negative.   Eyes: Negative.   Respiratory: Negative.   Cardiovascular: Negative.   Gastrointestinal: Negative.   Genitourinary: Negative for dysuria.  Musculoskeletal: Positive for back pain, joint pain (right shoulder pain) and neck pain.  Skin: Negative.     Physical Exam: There were no vitals taken for this visit. Physical Exam  Constitutional: She is oriented to person, place, and time. She appears well-developed and well-nourished. She appears distressed.  Eyes: Pupils are equal, round, and reactive to light.  Neck: Neck rigidity present. Decreased range of motion present. No edema present.  Cardiovascular: Normal rate, regular rhythm and normal heart sounds.  Pulmonary/Chest: Effort normal and breath sounds normal.  Abdominal: Soft. Bowel sounds are normal.  Musculoskeletal:       Right shoulder: She exhibits decreased range of motion, pain and decreased strength. She exhibits no bony tenderness and no swelling.  Neurological: She is alert and oriented to person, place, and time.  Skin: Skin is warm and dry.  Psychiatric: She has a normal mood and affect. Her behavior is normal. Judgment and thought content normal.     Lab results: Results for orders placed or performed during the hospital encounter of 11/21/18 (from the past 24 hour(s))  CBC with Differential/Platelet     Status: Abnormal   Collection Time: 11/21/18  1:09 PM  Result Value Ref Range   WBC 8.0 4.0 - 10.5 K/uL   RBC 3.93 3.87 - 5.11 MIL/uL   Hemoglobin 11.2 (L) 12.0 - 15.0 g/dL   HCT 31.3 (L) 36.0 - 46.0 %    MCV 79.6 (L) 80.0 - 100.0 fL   MCH 28.5 26.0 - 34.0 pg   MCHC 35.8 30.0 - 36.0 g/dL   RDW 15.2 11.5 - 15.5 %   Platelets 257 150 - 400 K/uL   nRBC 6.4 (H) 0.0 - 0.2 %   Neutrophils Relative % 36 %   Neutro Abs 2.9 1.7 - 7.7 K/uL   Lymphocytes Relative 44 %   Lymphs Abs 3.5 0.7 - 4.0 K/uL   Monocytes Relative 10 %   Monocytes Absolute 0.8 0.1 - 1.0 K/uL   Eosinophils Relative 9 %   Eosinophils Absolute 0.7 (H) 0.0 - 0.5 K/uL  Basophils Relative 1 %   Basophils Absolute 0.1 0.0 - 0.1 K/uL   WBC Morphology RARE NRBC    Immature Granulocytes 0 %   Abs Immature Granulocytes 0.02 0.00 - 0.07 K/uL   Polychromasia PRESENT    Sickle Cells PRESENT    Target Cells PRESENT   Comprehensive metabolic panel     Status: Abnormal   Collection Time: 11/21/18  1:09 PM  Result Value Ref Range   Sodium 139 135 - 145 mmol/L   Potassium 3.4 (L) 3.5 - 5.1 mmol/L   Chloride 105 98 - 111 mmol/L   CO2 28 22 - 32 mmol/L   Glucose, Bld 110 (H) 70 - 99 mg/dL   BUN 8 6 - 20 mg/dL   Creatinine, Ser 0.70 0.44 - 1.00 mg/dL   Calcium 9.4 8.9 - 10.3 mg/dL   Total Protein 6.9 6.5 - 8.1 g/dL   Albumin 4.2 3.5 - 5.0 g/dL   AST 28 15 - 41 U/L   ALT 32 0 - 44 U/L   Alkaline Phosphatase 45 38 - 126 U/L   Total Bilirubin 1.0 0.3 - 1.2 mg/dL   GFR calc non Af Amer >60 >60 mL/min   GFR calc Af Amer >60 >60 mL/min   Anion gap 6 5 - 15  Reticulocytes     Status: Abnormal   Collection Time: 11/21/18  1:09 PM  Result Value Ref Range   Retic Ct Pct 4.6 (H) 0.4 - 3.1 %   RBC. 3.93 3.87 - 5.11 MIL/uL   Retic Count, Absolute 182.0 19.0 - 186.0 K/uL   Immature Retic Fract 36.2 (H) 2.3 - 15.9 %    Imaging results:  No results found.   Assessment & Plan:  Patient will be admitted to the day infusion center for extended observation  Start IV D5.45 for cellular rehydration at 100/hr  Start Toradol 30 mg IV times one  Start Dilaudid PCA High Concentration per weight based protocol.   Patient will be  re-evaluated for pain intensity in the context of function and relationship to baseline as care progresses.  If no significant pain relief, will transfer patient to inpatient services for a higher level of care.   Will review BMP, CBC, Upreg, and UDS as results become available.      Moore   APRN, MSN, FNP-C [facility] [address]  [place], [place] [address] [phone]  11/22/2018, 11:48 AM   

## 2018-11-22 NOTE — Progress Notes (Signed)
Patient admitted to the day infusion hospital for sickle cell pain. Initially reported pain in back and neck rated 9/10. For pain management, patient received 1 mg IV Dilaudid, 1,000 mg Tylenol, 20 mg Oxycodone x 2, 30 mg Toradol and patient hydrated with IV fluids.  At discharge, patient reported pain level at 6/10. Discharge instructions given to patient. Vital signs stable. Patient alert, oriented and ambulatory at discharge.

## 2018-11-22 NOTE — Telephone Encounter (Signed)
Patient called, complained of pain in the legs and back and neck that she rated as 9/10. Denied chest pain, fever, abdominal pain and vomitting. Endorsed some nausea and "diarrhea" three times last night. Admitted to having means of transportation without driving self after treatment. Last took Oxycodone at 01:00 last night. Provider notified; per provider, patient can come in for treatment. Urine sample should be obtained when she comes and should come in as soon as possible. Patient notified, verbalized understanding.

## 2018-11-27 ENCOUNTER — Ambulatory Visit: Payer: Medicaid Other | Admitting: Family Medicine

## 2018-11-30 ENCOUNTER — Telehealth: Payer: Self-pay

## 2018-11-30 DIAGNOSIS — D5720 Sickle-cell/Hb-C disease without crisis: Secondary | ICD-10-CM

## 2018-11-30 NOTE — Telephone Encounter (Signed)
Refill request for oxycodone 15. Please advise. Thanks!

## 2018-12-01 ENCOUNTER — Ambulatory Visit: Payer: Medicaid Other | Admitting: Family Medicine

## 2018-12-01 MED ORDER — OXYCODONE HCL 15 MG PO TABS: 15.0000 mg | ORAL_TABLET | Freq: Four times a day (QID) | ORAL | 0 refills | Status: DC | PRN

## 2018-12-14 ENCOUNTER — Other Ambulatory Visit: Payer: Self-pay

## 2018-12-14 ENCOUNTER — Telehealth: Payer: Self-pay

## 2018-12-14 DIAGNOSIS — D5720 Sickle-cell/Hb-C disease without crisis: Secondary | ICD-10-CM

## 2018-12-14 DIAGNOSIS — G8929 Other chronic pain: Secondary | ICD-10-CM

## 2018-12-14 DIAGNOSIS — M546 Pain in thoracic spine: Principal | ICD-10-CM

## 2018-12-14 MED ORDER — CYCLOBENZAPRINE HCL 10 MG PO TABS: 10.0000 mg | ORAL_TABLET | Freq: Three times a day (TID) | ORAL | 0 refills | Status: DC

## 2018-12-14 NOTE — Telephone Encounter (Signed)
Refill request for oxycodone 15mg . Please advise if this can be sent in.

## 2018-12-18 ENCOUNTER — Other Ambulatory Visit: Payer: Self-pay | Admitting: Family Medicine

## 2018-12-18 ENCOUNTER — Telehealth: Payer: Self-pay

## 2018-12-18 DIAGNOSIS — Z79891 Long term (current) use of opiate analgesic: Secondary | ICD-10-CM

## 2018-12-18 DIAGNOSIS — D5720 Sickle-cell/Hb-C disease without crisis: Secondary | ICD-10-CM

## 2018-12-18 MED ORDER — OXYCODONE HCL 15 MG PO TABS: 15.0000 mg | ORAL_TABLET | Freq: Four times a day (QID) | ORAL | 0 refills | Status: DC | PRN

## 2018-12-18 NOTE — Telephone Encounter (Signed)
Patient notified

## 2018-12-28 ENCOUNTER — Other Ambulatory Visit: Payer: Self-pay

## 2018-12-28 DIAGNOSIS — G8929 Other chronic pain: Secondary | ICD-10-CM

## 2018-12-28 DIAGNOSIS — M546 Pain in thoracic spine: Principal | ICD-10-CM

## 2018-12-28 MED ORDER — CYCLOBENZAPRINE HCL 5 MG PO TABS: 5.0000 mg | ORAL_TABLET | Freq: Three times a day (TID) | ORAL | 1 refills | Status: DC | PRN

## 2018-12-28 NOTE — Telephone Encounter (Signed)
Refill request for cyclobenzaprine. Please advise.  

## 2018-12-29 ENCOUNTER — Other Ambulatory Visit: Payer: Self-pay | Admitting: Family Medicine

## 2018-12-29 DIAGNOSIS — D5720 Sickle-cell/Hb-C disease without crisis: Secondary | ICD-10-CM

## 2018-12-29 DIAGNOSIS — Z79891 Long term (current) use of opiate analgesic: Secondary | ICD-10-CM

## 2019-01-01 ENCOUNTER — Other Ambulatory Visit: Payer: Self-pay | Admitting: Family Medicine

## 2019-01-01 DIAGNOSIS — D5720 Sickle-cell/Hb-C disease without crisis: Secondary | ICD-10-CM

## 2019-01-01 DIAGNOSIS — Z79891 Long term (current) use of opiate analgesic: Secondary | ICD-10-CM

## 2019-01-01 NOTE — Telephone Encounter (Signed)
Andre, Please advise. Thanks!  

## 2019-01-01 NOTE — Telephone Encounter (Signed)
Called and spoke with patient, advised that she will need an appointment before refills are given. Thanks!

## 2019-01-03 ENCOUNTER — Ambulatory Visit: Payer: Medicaid Other | Admitting: Family Medicine

## 2019-01-05 ENCOUNTER — Ambulatory Visit (INDEPENDENT_AMBULATORY_CARE_PROVIDER_SITE_OTHER): Payer: Medicaid Other | Admitting: Family Medicine

## 2019-01-05 VITALS — BP 142/98 | HR 92 | Temp 97.9°F | Resp 14 | Ht 62.0 in | Wt 142.0 lb

## 2019-01-05 DIAGNOSIS — D5720 Sickle-cell/Hb-C disease without crisis: Secondary | ICD-10-CM

## 2019-01-05 LAB — POCT URINALYSIS DIPSTICK
Bilirubin, UA: NEGATIVE
Blood, UA: NEGATIVE
Glucose, UA: NEGATIVE
Ketones, UA: NEGATIVE
Leukocytes, UA: NEGATIVE
Nitrite, UA: NEGATIVE
Protein, UA: NEGATIVE
Spec Grav, UA: 1.025 (ref 1.010–1.025)
Urobilinogen, UA: 0.2 E.U./dL
pH, UA: 5.5 (ref 5.0–8.0)

## 2019-01-05 MED ORDER — TRAMADOL HCL 50 MG PO TABS: 50.0000 mg | ORAL_TABLET | ORAL | 0 refills | Status: DC | PRN

## 2019-01-05 NOTE — Patient Instructions (Signed)
You need 2 negative drug screens. You gave a sample today. You can come in next week for a lab visit for another one.   Sickle Cell Anemia, Adult Sickle cell anemia is a condition where your red blood cells are shaped like sickles. Red blood cells carry oxygen through the body. Sickle-shaped cells do not live as long as normal red blood cells. They also clump together and block blood from flowing through the blood vessels. This prevents the body from getting enough oxygen. Sickle cell anemia causes organ damage and pain. It also increases the risk of infection. Follow these instructions at home: Medicines  Take over-the-counter and prescription medicines only as told by your doctor.  If you were prescribed an antibiotic medicine, take it as told by your doctor. Do not stop taking the antibiotic even if you start to feel better.  If you develop a fever, do not take medicines to lower the fever right away. Tell your doctor about the fever. Managing pain, stiffness, and swelling  Try these methods to help with pain: ? Use a heating pad. ? Take a warm bath. ? Distract yourself, such as by watching TV. Eating and drinking  Drink enough fluid to keep your pee (urine) clear or pale yellow. Drink more in hot weather and during exercise.  Limit or avoid alcohol.  Eat a healthy diet. Eat plenty of fruits, vegetables, whole grains, and lean protein.  Take vitamins and supplements as told by your doctor. Traveling  When traveling, keep these with you: ? Your medical information. ? The names of your doctors. ? Your medicines.  If you need to take an airplane, talk to your doctor first. Activity  Rest often.  Avoid exercises that make your heart beat much faster, such as jogging. General instructions  Do not use products that have nicotine or tobacco, such as cigarettes and e-cigarettes. If you need help quitting, ask your doctor.  Consider wearing a medical alert bracelet.  Avoid  being in high places (high altitudes), such as mountains.  Avoid very hot or cold temperatures.  Avoid places where the temperature changes a lot.  Keep all follow-up visits as told by your doctor. This is important. Contact a doctor if:  A joint hurts.  Your feet or hands hurt or swell.  You feel tired (fatigued). Get help right away if:  You have symptoms of infection. These include: ? Fever. ? Chills. ? Being very tired. ? Irritability. ? Poor eating. ? Throwing up (vomiting).  You feel dizzy or faint.  You have new stomach pain, especially on the left side.  You have a an erection (priapism) that lasts more than 4 hours.  You have numbness in your arms or legs.  You have a hard time moving your arms or legs.  You have trouble talking.  You have pain that does not go away when you take medicine.  You are short of breath.  You are breathing fast.  You have a long-term cough.  You have pain in your chest.  You have a bad headache.  You have a stiff neck.  Your stomach looks bloated even though you did not eat much.  Your skin is pale.  You suddenly cannot see well. Summary  Sickle cell anemia is a condition where your red blood cells are shaped like sickles.  Follow your doctor's advice on ways to manage pain, food to eat, activities to do, and steps to take for safe travel.  Get medical help  right away if you have any signs of infection, such as a fever. This information is not intended to replace advice given to you by your health care provider. Make sure you discuss any questions you have with your health care provider. Document Released: 09/26/2013 Document Revised: 01/11/2017 Document Reviewed: 01/11/2017 Elsevier Interactive Patient Education  2019 [employer]

## 2019-01-05 NOTE — Progress Notes (Signed)
[facility]ICINE AND SICKLE CELL CARE  SICKLE CELL ANEMIA FOLLOW UP VISIT PROVIDER: Mike Gip, FNP    Subjective:   Hannah Hawkins  is a 33 y.o.  female who  has a past medical history of Sickle cell anemia (HCC). presents for a follow up for Sickle Cell Anemia. The patient has had 3 admissions in the past 6 months.  Pain regimen includes: Ibuprofen and Oxycodone 15mg .  Hydrea Therapy: Yes Medication compliance: Yes Patient has missed several follow up appointments due to lack of transportation. Patient states that she is unable to go to pain management due to not having a valid ID.  Patient positive for cocaine on 11/22/2018.   Pain today is 0/10.   The patient reports adequate daily hydration.    Review of Systems  Constitutional: Negative.   HENT: Negative.   Eyes: Negative.   Respiratory: Negative.   Cardiovascular: Negative.   Gastrointestinal: Negative.   Genitourinary: Negative.   Musculoskeletal: Negative.   Skin: Negative.   Neurological: Negative.   Psychiatric/Behavioral: Negative.     Objective:   Objective  BP (!) 142/98 (BP Location: Right Arm, Patient Position: Sitting, Cuff Size: Normal)   Pulse 92   Temp 97.9 F (36.6 C) (Oral)   Resp 14   Ht 5\' 2"  (1.575 m)   Wt 142 lb (64.4 kg)   LMP 12/30/2018   SpO2 100%   BMI 25.97 kg/m   Wt Readings from Last 3 Encounters:  01/05/19 142 lb (64.4 kg)  10/30/18 146 lb (66.2 kg)  10/16/18 144 lb (65.3 kg)     Physical Exam Vitals signs and nursing note reviewed.  Constitutional:      General: She is not in acute distress.    Appearance: She is well-developed.  HENT:     Head: Normocephalic and atraumatic.  Eyes:     Conjunctiva/sclera: Conjunctivae normal.     Pupils: Pupils are equal, round, and reactive to light.  Neck:     Musculoskeletal: Normal range of motion.  Cardiovascular:     Rate and Rhythm: Normal rate and regular rhythm.     Heart sounds: Normal heart  sounds.  Pulmonary:     Effort: Pulmonary effort is normal. No respiratory distress.     Breath sounds: Normal breath sounds.  Abdominal:     General: Bowel sounds are normal. There is no distension.     Palpations: Abdomen is soft.  Musculoskeletal: Normal range of motion.  Skin:    General: Skin is warm and dry.  Neurological:     Mental Status: She is alert and oriented to person, place, and time.  Psychiatric:        Behavior: Behavior normal.        Thought Content: Thought content normal.      Assessment/Plan:   Assessment   Encounter Diagnosis  Name Primary?  . 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 Patient admits to taking cocaine in the past week. Can take tramadol with tylenol for pain relief. Continue with ibuprofen as previously prescribed. Advised patient of the dangers of using illegal substances with narcotics. Instructed patient to hydrate and rest to prevent pain.  - Urinalysis Dipstick - traMADol (ULTRAM) 50 MG tablet; Take 1 tablet (50 mg total) by mouth every 4 (four) hours as needed for up to  15 days for moderate pain (can take with tylenol.).  Dispense: 90 tablet; Refill: 0 - [id number]+Oxyco+Alc+Crt-Bund    Return to care as scheduled and prn. Patient verbalized understanding and agreed with plan of care.   1. Sickle cell disease - Continue Hydrea We discussed the need for good hydration, monitoring of hydration status, avoidance of heat, cold, stress, and infection triggers. We discussed the risks and benefits of Hydrea, including bone marrow suppression, the possibility of GI upset, skin ulcers, hair thinning, and teratogenicity. The patient was reminded of the need to seek medical attention of any symptoms of bleeding, anemia, or infection. Continue folic acid 1 mg daily to prevent  aplastic bone marrow crises.   2. Pulmonary evaluation - Patient denies severe recurrent wheezes, shortness of breath with exercise, or persistent cough. If these symptoms develop, pulmonary function tests with spirometry will be ordered, and if abnormal, plan on referral to Pulmonology for further evaluation.  3. Cardiac - Routine screening for pulmonary hypertension is not recommended.  4. Eye - High risk of proliferative retinopathy. Annual eye exam with retinal exam recommended to patient.  5. Immunization status -  Yearly influenza vaccination is recommended, as well as being up to date with Meningococcal and Pneumococcal vaccines.   6. Acute and chronic painful episodes - We discussed that pt is to receive Schedule II prescriptions only from [place]. Pt is also aware that the prescription history is available to [place] online through the [facility] CSRS. Controlled substance agreement signed. We reminded Sashae Spagnolo that all patients receiving Schedule II narcotics must be seen for follow within one month of prescription being requested. We reviewed the terms of our pain agreement, including the need to keep medicines in a safe locked location away from children or pets, and the need to report excess sedation or constipation, measures to avoid constipation, and policies related to early refills and stolen prescriptions. According to the [place] [facility] program, we have reviewed details related to analgesia, adverse effects, aberrant behaviors.  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  Not applicable at this time.  If this occurs will use Exjade for management.   8. Vitamin D deficiency - Drisdol 50,000 units weekly. Patient encouraged to take as prescribed.   The above recommendations are taken from the NIH Evidence-Based Management of Sickle Cell Disease: Expert Panel Report, 96222.   Ms. Andr L. Riley Lam, FNP-BC [facility] [address] [place], [place] [address] [phone]  This note has been created with Dragon speech recognition software and smart phrase technology. Any transcriptional errors are unintentional.

## 2019-01-09 LAB — OXYCODONE/OXYMORPHONE, CONFIRM
OXYCODONE/OXYMORPH: POSITIVE — AB
OXYCODONE: 583 ng/mL
OXYCODONE: POSITIVE — AB
OXYMORPHONE: NEGATIVE

## 2019-01-09 LAB — DRUG SCREEN 764883 11+OXYCO+ALC+CRT-BUND
Amphetamines, Urine: NEGATIVE ng/mL
BENZODIAZ UR QL: NEGATIVE ng/mL
Barbiturate: NEGATIVE ng/mL
Cannabinoid Quant, Ur: NEGATIVE ng/mL
Cocaine (Metabolite): NEGATIVE ng/mL
Creatinine: 141.7 mg/dL (ref 20.0–300.0)
Ethanol: NEGATIVE %
Meperidine: NEGATIVE ng/mL
Methadone Screen, Urine: NEGATIVE ng/mL
OPIATE SCREEN URINE: NEGATIVE ng/mL
Phencyclidine: NEGATIVE ng/mL
Propoxyphene: NEGATIVE ng/mL
Tramadol: NEGATIVE ng/mL
pH, Urine: 5.7 (ref 4.5–8.9)

## 2019-01-11 ENCOUNTER — Telehealth: Payer: Self-pay

## 2019-01-11 NOTE — Telephone Encounter (Signed)
Patient is asking if she can have her oxycodone since her last drug screen was negative for cocaine? Please advise. Thanks!

## 2019-01-12 NOTE — Telephone Encounter (Signed)
Called and spoke with patient, advised that she will have to have 2 negative drug screens prior to her pain meds being refilled. She verbalized understanding and she was transferred to front to set up appointment. Thanks!

## 2019-01-12 NOTE — Telephone Encounter (Signed)
As discussed in the office visit, she has to have 2 negative drug screens prior to receiving her pain medications. She can come in next week for another UDS.

## 2019-01-12 NOTE — Progress Notes (Deleted)
[facility] Clinic Note - Initial Visit   Date: 01/12/19  Hannah Hawkins MRN: [id number] DOB: 09-13-1986   Dear Dr Marland KitchenLanae Boast, FNP:  Thank you for your kind referral of [name] for consultation of left hand numbness. Although her history is well known to you, please allow [place] to reiterate it for the purpose of our medical record. The patient was accompanied to the clinic by *** who also provides collateral information.     History of Present Illness: Hannah Hawkins is a 33 y.o. ***-handed Caucasian/*** female with sickle cell anemia *** presenting for evaluation of ***.    Out-side paper records, electronic medical record, and images have been reviewed where available and summarized as: ***  Past Medical History:  Diagnosis Date  . Sickle cell anemia (HCC)     Past Surgical History:  Procedure Laterality Date  . EYE SURGERY  03/09/2017   left eye     Medications:  Outpatient Encounter Medications as of 01/15/2019  Medication Sig  . cyclobenzaprine (FLEXERIL) 5 MG tablet Take 1 tablet (5 mg total) by mouth 3 (three) times daily as needed for muscle spasms.  . ergocalciferol (VITAMIN D2) 50000 units capsule Take 1 capsule (50,000 Units total) by mouth once a week. (Patient not taking: Reported on 08/17/2018)  . folic acid (FOLVITE) 1 MG tablet Take 1 tablet (1 mg total) by mouth daily. (Patient not taking: Reported on 02/28/2018)  . hydroxyurea (HYDREA) 500 MG capsule Take 2 capsules (1,000 mg total) by mouth daily.  Marland Kitchen ibuprofen (ADVIL,MOTRIN) 800 MG tablet Take 1 tablet (800 mg total) by mouth every 8 (eight) hours as needed for moderate pain.  Marland Kitchen levocetirizine (XYZAL) 5 MG tablet TAKE 1 TABLET(5 MG) BY MOUTH EVERY EVENING  . Multiple Vitamin (MULTIVITAMIN WITH MINERALS) TABS tablet Take 1 tablet by mouth daily.  Marland Kitchen NARCAN 4 MG/0.1ML LIQD nasal spray kit Place 4 mg into the nose once as needed. overdose  . pantoprazole (PROTONIX) 40  MG tablet Take 1 tablet (40 mg total) by mouth daily.  . predniSONE (DELTASONE) 20 MG tablet Take 1 tablet (20 mg total) by mouth daily.  . traMADol (ULTRAM) 50 MG tablet Take 1 tablet (50 mg total) by mouth every 4 (four) hours as needed for up to 15 days for moderate pain (can take with tylenol.).   No facility-administered encounter medications on file as of 01/15/2019.      Allergies:  Allergies  Allergen Reactions  . Penicillins Swelling and Rash    Has patient had a PCN reaction causing immediate rash, facial/tongue/throat swelling, SOB or lightheadedness with hypotension: Yes Has patient had a PCN reaction causing severe rash involving mucus membranes or skin necrosis: Yes Has patient had a PCN reaction that required hospitalization No Has patient had a PCN reaction occurring within the last 10 years: no If all of the above answers are "NO", then may proceed with Cephalosporin use.     Family History: No family history on file.  Social History: Social History   Tobacco Use  . Smoking status: Former Research scientist (life sciences)  . Smokeless tobacco: Never Used  Substance Use Topics  . Alcohol use: Yes    Comment: Twice a month  . Drug use: No   Social History   Social History Narrative  . Not on file    Review of Systems:  CONSTITUTIONAL: No fevers, chills, night sweats, or weight loss.  *** EYES: No visual changes or eye pain ENT: No hearing changes.  No  history of nose bleeds.   RESPIRATORY: No cough, wheezing and shortness of breath.   CARDIOVASCULAR: Negative for chest pain, and palpitations.   GI: Negative for abdominal discomfort, blood in stools or black stools.  No recent change in bowel habits.   GU:  No history of incontinence.   MUSCLOSKELETAL: No history of joint pain or swelling.  No myalgias.   SKIN: Negative for lesions, rash, and itching.   HEMATOLOGY/ONCOLOGY: Negative for prolonged bleeding, bruising easily, and swollen nodes.  No history of cancer.   ENDOCRINE:  Negative for cold or heat intolerance, polydipsia or goiter.   PSYCH:  ***depression or anxiety symptoms.   NEURO: As Above.   Vital Signs:  LMP 12/30/2018  Pain Scale: *** on a scale of 0-10   General Medical Exam:  *** General:  Well appearing, comfortable.   Eyes/ENT: see cranial nerve examination.   Neck: No masses appreciated.  Full range of motion without tenderness.  No carotid bruits. Respiratory:  Clear to auscultation, good air entry bilaterally.   Cardiac:  Regular rate and rhythm, no murmur.   Extremities:  No deformities, edema, or skin discoloration.  Skin:  No rashes or lesions.  Neurological Exam: MENTAL STATUS including orientation to time, place, person, recent and remote memory, attention span and concentration, language, and fund of knowledge is ***normal.  Speech is not dysarthric.  CRANIAL NERVES: II:  No visual field defects.  Unremarkable fundi.   III-IV-VI: Pupils equal round and reactive to light.  Normal conjugate, extra-ocular eye movements in all directions of gaze.  No nystagmus.  No ptosis***.   V:  Normal facial sensation.  Jaw jerk is ***.   VII:  Normal facial symmetry and movements.  No pathologic facial reflexes.  VIII:  Normal hearing and vestibular function.   IX-X:  Normal palatal movement.   XI:  Normal shoulder shrug and head rotation.   XII:  Normal tongue strength and range of motion, no deviation or fasciculation.  MOTOR:  No atrophy, fasciculations or abnormal movements.  No pronator drift.  Tone is normal.    Right Upper Extremity:    Left Upper Extremity:    Deltoid  5/5   Deltoid  5/5   Biceps  5/5   Biceps  5/5   Triceps  5/5   Triceps  5/5   Wrist extensors  5/5   Wrist extensors  5/5   Wrist flexors  5/5   Wrist flexors  5/5   Finger extensors  5/5   Finger extensors  5/5   Finger flexors  5/5   Finger flexors  5/5   Dorsal interossei  5/5   Dorsal interossei  5/5   Abductor pollicis  5/5   Abductor pollicis  5/5   Tone  (Ashworth scale)  0  Tone (Ashworth scale)  0   Right Lower Extremity:    Left Lower Extremity:    Hip flexors  5/5   Hip flexors  5/5   Hip extensors  5/5   Hip extensors  5/5   Knee flexors  5/5   Knee flexors  5/5   Knee extensors  5/5   Knee extensors  5/5   Dorsiflexors  5/5   Dorsiflexors  5/5   Plantarflexors  5/5   Plantarflexors  5/5   Toe extensors  5/5   Toe extensors  5/5   Toe flexors  5/5   Toe flexors  5/5   Tone (Ashworth scale)  0  Tone (Ashworth  scale)  0   MSRs:  Right                                                                 Left brachioradialis 2+  brachioradialis 2+  biceps 2+  biceps 2+  triceps 2+  triceps 2+  patellar 2+  patellar 2+  ankle jerk 2+  ankle jerk 2+  Hoffman no  Hoffman no  plantar response down  plantar response down   SENSORY:  Normal and symmetric perception of light touch, pinprick, vibration, and proprioception.  Romberg's sign absent.   COORDINATION/GAIT: Normal finger-to- nose-finger and heel-to-shin.  Intact rapid alternating movements bilaterally.  Able to rise from a chair without using arms.  Gait narrow based and stable. Tandem and stressed gait intact.    IMPRESSION: ***  PLAN/RECOMMENDATIONS:  *** Return to clinic in *** months.   The duration of this appointment visit was *** minutes of face-to-face time with the patient.  Greater than 50% of this time was spent in counseling, explanation of diagnosis, planning of further management, and coordination of care.   Thank you for allowing me to participate in patient's care.  If I can answer any additional questions, I would be pleased to do so.    Sincerely,     K. Posey Pronto, DO

## 2019-01-15 ENCOUNTER — Ambulatory Visit: Payer: Medicaid Other | Admitting: Neurology

## 2019-01-16 ENCOUNTER — Other Ambulatory Visit: Payer: Medicaid Other

## 2019-01-16 ENCOUNTER — Other Ambulatory Visit: Payer: Self-pay | Admitting: Family Medicine

## 2019-01-16 DIAGNOSIS — F1490 Cocaine use, unspecified, uncomplicated: Secondary | ICD-10-CM

## 2019-01-19 ENCOUNTER — Other Ambulatory Visit: Payer: Self-pay

## 2019-01-19 ENCOUNTER — Telehealth: Payer: Self-pay

## 2019-01-19 DIAGNOSIS — D5720 Sickle-cell/Hb-C disease without crisis: Secondary | ICD-10-CM

## 2019-01-19 DIAGNOSIS — M546 Pain in thoracic spine: Secondary | ICD-10-CM

## 2019-01-19 DIAGNOSIS — G8929 Other chronic pain: Secondary | ICD-10-CM

## 2019-01-19 NOTE — Telephone Encounter (Signed)
Patient is asking for a refill on oxycodone if/when her drug screen comes back. If not can you refill the tramadol?

## 2019-01-19 NOTE — Telephone Encounter (Signed)
Please advise if tramadol can be refilled for patient

## 2019-01-23 MED ORDER — TRAMADOL HCL 50 MG PO TABS: 50.0000 mg | ORAL_TABLET | ORAL | 0 refills | Status: DC | PRN

## 2019-01-23 NOTE — Telephone Encounter (Signed)
Patient is asking if cyclobenzaprine can be sent in as well? I have advised her that tramadol has been sent into pharmacy. Thanks!

## 2019-01-24 LAB — DRUG SCREEN 764883 11+OXYCO+ALC+CRT-BUND
Amphetamines, Urine: NEGATIVE ng/mL
BENZODIAZ UR QL: NEGATIVE ng/mL
Barbiturate: NEGATIVE ng/mL
Cannabinoid Quant, Ur: NEGATIVE ng/mL
Cocaine (Metabolite): NEGATIVE ng/mL
Creatinine: 289.5 mg/dL (ref 20.0–300.0)
Ethanol: NEGATIVE %
Meperidine: NEGATIVE ng/mL
Methadone Screen, Urine: NEGATIVE ng/mL
OPIATE SCREEN URINE: NEGATIVE ng/mL
Oxycodone/Oxymorphone, Urine: NEGATIVE ng/mL
Phencyclidine: NEGATIVE ng/mL
Propoxyphene: NEGATIVE ng/mL
pH, Urine: 6 (ref 4.5–8.9)

## 2019-01-24 LAB — TRAMADOL GC/MS, URINE
Tramadol gc/ms Conf: >3000 ng/mL
Tramadol: POSITIVE — AB

## 2019-01-25 ENCOUNTER — Other Ambulatory Visit: Payer: Self-pay | Admitting: Family Medicine

## 2019-01-25 DIAGNOSIS — Z79891 Long term (current) use of opiate analgesic: Secondary | ICD-10-CM

## 2019-01-25 DIAGNOSIS — D57219 Sickle-cell/Hb-C disease with crisis, unspecified: Secondary | ICD-10-CM

## 2019-01-25 MED ORDER — OXYCODONE HCL 10 MG PO TABS: 10.0000 mg | ORAL_TABLET | Freq: Four times a day (QID) | ORAL | 0 refills | Status: DC | PRN

## 2019-01-25 NOTE — Telephone Encounter (Signed)
Please inform patient that I sent 1 weeks worth of oxycodone to the pharmacy.

## 2019-01-26 MED ORDER — CYCLOBENZAPRINE HCL 5 MG PO TABS: 5.0000 mg | ORAL_TABLET | Freq: Three times a day (TID) | ORAL | 1 refills | Status: DC | PRN

## 2019-01-26 NOTE — Telephone Encounter (Signed)
Med sent.

## 2019-01-26 NOTE — Telephone Encounter (Signed)
Spoke with patient and informed that oxycodone was sent to pharmacy. She is still asking about the cyclobenzaprine. Can this be sent in?. Please advise.

## 2019-01-31 ENCOUNTER — Telehealth: Payer: Self-pay

## 2019-01-31 NOTE — Telephone Encounter (Signed)
Called and spoke with patient. She is asking if she can have the rest of her prescription since the last one was only for 8 days. I advised her she has an appointment tomorrow and she states she will keep this. Please advise if this can be done or if she needs to wait and speak with you regarding this tomorrow. Thanks!

## 2019-02-01 ENCOUNTER — Ambulatory Visit (INDEPENDENT_AMBULATORY_CARE_PROVIDER_SITE_OTHER): Payer: Medicaid Other | Admitting: Family Medicine

## 2019-02-01 VITALS — BP 127/85 | HR 94 | Temp 98.8°F | Resp 16 | Ht 62.0 in | Wt 141.0 lb

## 2019-02-01 DIAGNOSIS — D5720 Sickle-cell/Hb-C disease without crisis: Secondary | ICD-10-CM | POA: Diagnosis not present

## 2019-02-01 DIAGNOSIS — E559 Vitamin D deficiency, unspecified: Secondary | ICD-10-CM

## 2019-02-01 DIAGNOSIS — N926 Irregular menstruation, unspecified: Secondary | ICD-10-CM | POA: Diagnosis not present

## 2019-02-01 LAB — POCT URINALYSIS DIPSTICK
Bilirubin, UA: NEGATIVE
Glucose, UA: NEGATIVE
Ketones, UA: NEGATIVE
Leukocytes, UA: NEGATIVE
Nitrite, UA: NEGATIVE
Protein, UA: POSITIVE — AB
Spec Grav, UA: 1.02 (ref 1.010–1.025)
Urobilinogen, UA: 0.2 E.U./dL
pH, UA: 6.5 (ref 5.0–8.0)

## 2019-02-01 MED ORDER — ERGOCALCIFEROL 1.25 MG (50000 UT) PO CAPS: 50000.0000 [IU] | ORAL_CAPSULE | ORAL | 3 refills | Status: DC

## 2019-02-01 MED ORDER — OXYCODONE HCL 15 MG PO TABS: 15.0000 mg | ORAL_TABLET | Freq: Four times a day (QID) | ORAL | 0 refills | Status: DC | PRN

## 2019-02-01 NOTE — Patient Instructions (Addendum)
I placed a referral to the gynecologist for you. You should hear from them within the next 2 weeks.   Sickle Cell Anemia, Adult Sickle cell anemia is a condition where your red blood cells are shaped like sickles. Red blood cells carry oxygen through the body. Sickle-shaped cells do not live as long as normal red blood cells. They also clump together and block blood from flowing through the blood vessels. This prevents the body from getting enough oxygen. Sickle cell anemia causes organ damage and pain. It also increases the risk of infection. Follow these instructions at home: Medicines  Take over-the-counter and prescription medicines only as told by your doctor.  If you were prescribed an antibiotic medicine, take it as told by your doctor. Do not stop taking the antibiotic even if you start to feel better.  If you develop a fever, do not take medicines to lower the fever right away. Tell your doctor about the fever. Managing pain, stiffness, and swelling  Try these methods to help with pain: ? Use a heating pad. ? Take a warm bath. ? Distract yourself, such as by watching TV. Eating and drinking  Drink enough fluid to keep your pee (urine) clear or pale yellow. Drink more in hot weather and during exercise.  Limit or avoid alcohol.  Eat a healthy diet. Eat plenty of fruits, vegetables, whole grains, and lean protein.  Take vitamins and supplements as told by your doctor. Traveling  When traveling, keep these with you: ? Your medical information. ? The names of your doctors. ? Your medicines.  If you need to take an airplane, talk to your doctor first. Activity  Rest often.  Avoid exercises that make your heart beat much faster, such as jogging. General instructions  Do not use products that have nicotine or tobacco, such as cigarettes and e-cigarettes. If you need help quitting, ask your doctor.  Consider wearing a medical alert bracelet.  Avoid being in high places  (high altitudes), such as mountains.  Avoid very hot or cold temperatures.  Avoid places where the temperature changes a lot.  Keep all follow-up visits as told by your doctor. This is important. Contact a doctor if:  A joint hurts.  Your feet or hands hurt or swell.  You feel tired (fatigued). Get help right away if:  You have symptoms of infection. These include: ? Fever. ? Chills. ? Being very tired. ? Irritability. ? Poor eating. ? Throwing up (vomiting).  You feel dizzy or faint.  You have new stomach pain, especially on the left side.  You have a an erection (priapism) that lasts more than 4 hours.  You have numbness in your arms or legs.  You have a hard time moving your arms or legs.  You have trouble talking.  You have pain that does not go away when you take medicine.  You are short of breath.  You are breathing fast.  You have a long-term cough.  You have pain in your chest.  You have a bad headache.  You have a stiff neck.  Your stomach looks bloated even though you did not eat much.  Your skin is pale.  You suddenly cannot see well. Summary  Sickle cell anemia is a condition where your red blood cells are shaped like sickles.  Follow your doctor's advice on ways to manage pain, food to eat, activities to do, and steps to take for safe travel.  Get medical help right away if you have  any signs of infection, such as a fever. This information is not intended to replace advice given to you by your health care provider. Make sure you discuss any questions you have with your health care provider. Document Released: 09/26/2013 Document Revised: 01/11/2017 Document Reviewed: 01/11/2017 Elsevier Interactive Patient Education  2019 [employer].

## 2019-02-01 NOTE — Progress Notes (Signed)
[facility]  SICKLE CELL ANEMIA FOLLOW UP VISIT PROVIDER: Mike Gip, FNP    Subjective:   Hannah Hawkins who  has a past medical history of Sickle cell anemia (HCC). presents for a follow up for Sickle Cell Anemia. The patient has had 3 admissions in the past 6 months.  Pain regimen includes: Ibuprofen and Oxycodone 15 Hydrea Therapy: Yes Medication compliance: Yes  Pain today is 7/10 The patient reports adequate daily hydration.   Patient is concerned because although she has unprotected sex on a regular basis, she has not been able to conceive. She states that her menstrual cycle has been irregular because it no longer begins on the same day of the month. She also states that her flow has increased and the color has become darker. Not currently on any form of birth control   Review of Systems  Constitutional: Negative.   HENT: Negative.   Eyes: Negative.   Respiratory: Negative.   Cardiovascular: Negative.   Gastrointestinal: Negative.   Genitourinary: Negative.   Musculoskeletal: Positive for back pain, joint pain and myalgias.  Skin: Negative.   Neurological: Negative.   Psychiatric/Behavioral: Negative.     Objective:   Objective  BP 127/85 (BP Location: Right Arm, Patient Position: Sitting, Cuff Size: Normal)   Pulse 94   Temp 98.8 F (37.1 C) (Oral)   Resp 16   Ht 5\' 2"  (1.575 m)   Wt 141 lb (64 kg)   LMP 01/29/2019   SpO2 100%   BMI 25.79 kg/m   Wt Readings from Last 3 Encounters:  02/01/19 141 lb (64 kg)  01/05/19 142 lb (64.4 kg)  10/30/18 146 lb (66.2 kg)     Physical Exam Vitals signs and nursing note reviewed.  Constitutional:      General: She is not in acute distress.    Appearance: She is well-developed.  HENT:     Head: Normocephalic and atraumatic.  Eyes:     Conjunctiva/sclera: Conjunctivae normal.     Pupils: Pupils are equal, round, and reactive to light.    Neck:     Musculoskeletal: Normal range of motion.  Cardiovascular:     Rate and Rhythm: Normal rate and regular rhythm.     Heart sounds: Normal heart sounds.  Pulmonary:     Effort: Pulmonary effort is normal. No respiratory distress.     Breath sounds: Normal breath sounds.  Abdominal:     General: Bowel sounds are normal. There is no distension.     Palpations: Abdomen is soft.  Musculoskeletal: Normal range of motion.  Skin:    General: Skin is warm and dry.  Neurological:     Mental Status: She is alert and oriented to person, place, and time.  Psychiatric:        Behavior: Behavior normal.        Thought Content: Thought content normal.      Assessment/Plan:   Assessment   Encounter Diagnoses  Name Primary?  . 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 Vitamin D deficiency   . Menstruation, abnormal      Plan  1. Sickle cell-hemoglobin C disease without crisis (HCC) - Urinalysis Dipstick - oxyCODONE (ROXICODONE) 15 MG immediate release tablet; Take 1 tablet (15 mg total) by mouth every 6 (six) hours as needed for up to 15 days for pain.  Dispense: 60 tablet; Refill: 0 - CBC with Differential -  Comprehensive metabolic panel  2. Vitamin D deficiency - ergocalciferol (VITAMIN D2) 1.25 MG (50000 UT) capsule; Take 1 capsule (50,000 Units total) by mouth once a week.  Dispense: 12 capsule; Refill: 3 - Vitamin D, 25-hydroxy  3. Menstruation, abnormal Explained to patient that stress can cause changes in menstrual cycle. Patient would like referral to Gyn for further evaluation.  - Ambulatory referral to Gynecology   Return to care as scheduled and prn. Patient verbalized understanding and agreed with plan of care.   1. Sickle cell disease - Continue Hydrea   We discussed the need for good hydration, monitoring of hydration status, avoidance of heat, cold, stress, and infection triggers. We discussed the risks and benefits of Hydrea, including  bone marrow suppression, the possibility of GI upset, skin ulcers, hair thinning, and teratogenicity. The patient was reminded of the need to seek medical attention of any symptoms of bleeding, anemia, or infection. Continue folic acid 1 mg daily to prevent aplastic bone marrow crises.   2. Pulmonary evaluation - Patient denies severe recurrent wheezes, shortness of breath with exercise, or persistent cough. If these symptoms develop, pulmonary function tests with spirometry will be ordered, and if abnormal, plan on referral to Pulmonology for further evaluation.  3. Cardiac - Routine screening for pulmonary hypertension is not recommended.  4. Eye - High risk of proliferative retinopathy. Annual eye exam with retinal exam recommended to patient.  5. Immunization status -  Yearly influenza vaccination is recommended, as well as being up to date with Meningococcal and Pneumococcal vaccines.   6. Acute and chronic painful episodes - We discussed that pt is to receive Schedule II prescriptions only from [place]. Pt is also aware that the prescription history is available to [place] online through the [employer] CSRS. Controlled substance agreement signed. We reminded Hannah Hawkins that all patients receiving Schedule II narcotics must be seen for follow within one month of prescription being requested. We reviewed the terms of our pain agreement, including the need to keep medicines in a safe locked location away from children or pets, and the need to report excess sedation or constipation, measures to avoid constipation, and policies related to early refills and stolen prescriptions. According to the [place] [facility] [employer] program, we have reviewed details related to analgesia, adverse effects, aberrant behaviors.  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  Not applicable at this time.  If this occurs will use Exjade for management.   8. Vitamin D deficiency - Drisdol 50,000 units weekly. Patient encouraged to  take as prescribed.   The above recommendations are taken from the NIH Evidence-Based Management of Sickle Cell Disease: Expert Panel Report, 16109.   Ms. Hannah L. Riley Lam, FNP-BC [facility] [address] [place], [place] [address] [phone]  This note has been created with Dragon speech recognition software and smart phrase technology. Any transcriptional errors are unintentional.

## 2019-02-02 LAB — COMPREHENSIVE METABOLIC PANEL
ALT: 14 IU/L (ref 0–32)
AST: 17 IU/L (ref 0–40)
Albumin/Globulin Ratio: 1.7 (ref 1.2–2.2)
Albumin: 3.8 g/dL (ref 3.8–4.8)
Alkaline Phosphatase: 55 IU/L (ref 39–117)
BUN/Creatinine Ratio: 10 (ref 9–23)
BUN: 7 mg/dL (ref 6–20)
Bilirubin Total: 0.4 mg/dL (ref 0.0–1.2)
CO2: 26 mmol/L (ref 20–29)
Calcium: 8.9 mg/dL (ref 8.7–10.2)
Chloride: 102 mmol/L (ref 96–106)
Creatinine, Ser: 0.71 mg/dL (ref 0.57–1.00)
GFR calc Af Amer: 129 mL/min/{1.73_m2} (ref >59)
GFR calc non Af Amer: 111 mL/min/{1.73_m2} (ref >59)
Globulin, Total: 2.3 g/dL (ref 1.5–4.5)
Glucose: 90 mg/dL (ref 65–99)
Potassium: 3.5 mmol/L (ref 3.5–5.2)
Sodium: 141 mmol/L (ref 134–144)
Total Protein: 6.1 g/dL (ref 6.0–8.5)

## 2019-02-02 LAB — CBC WITH DIFFERENTIAL/PLATELET
Basophils Absolute: 0.1 10*3/uL (ref 0.0–0.2)
Basos: 1 %
EOS (ABSOLUTE): 0.6 10*3/uL — ABNORMAL HIGH (ref 0.0–0.4)
Eos: 8 %
Hematocrit: 32.4 % — ABNORMAL LOW (ref 34.0–46.6)
Hemoglobin: 10.7 g/dL — ABNORMAL LOW (ref 11.1–15.9)
Immature Grans (Abs): 0 10*3/uL (ref 0.0–0.1)
Immature Granulocytes: 1 %
Lymphocytes Absolute: 3.1 10*3/uL (ref 0.7–3.1)
Lymphs: 43 %
MCH: 27.6 pg (ref 26.6–33.0)
MCHC: 33 g/dL (ref 31.5–35.7)
MCV: 84 fL (ref 79–97)
Monocytes Absolute: 0.5 10*3/uL (ref 0.1–0.9)
Monocytes: 7 %
NRBC: 7 % — ABNORMAL HIGH (ref 0–0)
Neutrophils Absolute: 2.9 10*3/uL (ref 1.4–7.0)
Neutrophils: 40 %
Platelets: 364 10*3/uL (ref 150–450)
RBC: 3.87 x10E6/uL (ref 3.77–5.28)
RDW: 16.6 % — ABNORMAL HIGH (ref 11.7–15.4)
WBC: 7.2 10*3/uL (ref 3.4–10.8)

## 2019-02-02 LAB — VITAMIN D 25 HYDROXY (VIT D DEFICIENCY, FRACTURES): Vit D, 25-Hydroxy: 24.3 ng/mL — ABNORMAL LOW (ref 30.0–100.0)

## 2019-02-06 ENCOUNTER — Other Ambulatory Visit: Payer: Self-pay | Admitting: Family Medicine

## 2019-02-06 DIAGNOSIS — M546 Pain in thoracic spine: Principal | ICD-10-CM

## 2019-02-06 DIAGNOSIS — G8929 Other chronic pain: Secondary | ICD-10-CM

## 2019-02-15 ENCOUNTER — Other Ambulatory Visit: Payer: Self-pay

## 2019-02-15 DIAGNOSIS — D5720 Sickle-cell/Hb-C disease without crisis: Secondary | ICD-10-CM

## 2019-02-16 ENCOUNTER — Telehealth: Payer: Self-pay

## 2019-02-16 MED ORDER — OXYCODONE HCL 15 MG PO TABS: 15.0000 mg | ORAL_TABLET | Freq: Four times a day (QID) | ORAL | 0 refills | Status: DC | PRN

## 2019-02-16 NOTE — Telephone Encounter (Signed)
Called and spoke with patient, advised that I have entered in prior authorization today right before lunch.

## 2019-02-16 NOTE — Telephone Encounter (Signed)
Refill request for oxycodone.  

## 2019-03-01 ENCOUNTER — Other Ambulatory Visit: Payer: Self-pay

## 2019-03-01 ENCOUNTER — Encounter: Payer: Self-pay | Admitting: Family Medicine

## 2019-03-01 ENCOUNTER — Ambulatory Visit (INDEPENDENT_AMBULATORY_CARE_PROVIDER_SITE_OTHER): Payer: Medicaid Other | Admitting: Family Medicine

## 2019-03-01 VITALS — BP 126/82 | HR 88 | Temp 97.8°F | Ht 62.0 in | Wt 136.8 lb

## 2019-03-01 DIAGNOSIS — D5720 Sickle-cell/Hb-C disease without crisis: Secondary | ICD-10-CM

## 2019-03-01 DIAGNOSIS — Z09 Encounter for follow-up examination after completed treatment for conditions other than malignant neoplasm: Secondary | ICD-10-CM

## 2019-03-01 LAB — POCT URINALYSIS DIP (MANUAL ENTRY)
Bilirubin, UA: NEGATIVE
Glucose, UA: NEGATIVE mg/dL
Ketones, POC UA: NEGATIVE mg/dL
Leukocytes, UA: NEGATIVE
Nitrite, UA: NEGATIVE
Protein Ur, POC: NEGATIVE mg/dL
Spec Grav, UA: 1.02 (ref 1.010–1.025)
Urobilinogen, UA: 0.2 E.U./dL
pH, UA: 6.5 (ref 5.0–8.0)

## 2019-03-01 MED ORDER — BACLOFEN 10 MG PO TABS: 10.0000 mg | ORAL_TABLET | Freq: Three times a day (TID) | ORAL | 0 refills | Status: DC | PRN

## 2019-03-01 MED ORDER — OXYCODONE HCL 15 MG PO TABS: 15.0000 mg | ORAL_TABLET | Freq: Four times a day (QID) | ORAL | 0 refills | Status: DC | PRN

## 2019-03-01 NOTE — Patient Instructions (Signed)
Sickle Cell Anemia, Adult °Sickle cell anemia is a condition where your red blood cells are shaped like sickles. Red blood cells carry oxygen through the body. Sickle-shaped cells do not live as long as normal red blood cells. They also clump together and block blood from flowing through the blood vessels. This prevents the body from getting enough oxygen. Sickle cell anemia causes organ damage and pain. It also increases the risk of infection. °Follow these instructions at home: °Medicines °· Take over-the-counter and prescription medicines only as told by your doctor. °· If you were prescribed an antibiotic medicine, take it as told by your doctor. Do not stop taking the antibiotic even if you start to feel better. °· If you develop a fever, do not take medicines to lower the fever right away. Tell your doctor about the fever. °Managing pain, stiffness, and swelling °· Try these methods to help with pain: °? Use a heating pad. °? Take a warm bath. °? Distract yourself, such as by watching TV. °Eating and drinking °· Drink enough fluid to keep your pee (urine) clear or pale yellow. Drink more in hot weather and during exercise. °· Limit or avoid alcohol. °· Eat a healthy diet. Eat plenty of fruits, vegetables, whole grains, and lean protein. °· Take vitamins and supplements as told by your doctor. °Traveling °· When traveling, keep these with you: °? Your medical information. °? The names of your doctors. °? Your medicines. °· If you need to take an airplane, talk to your doctor first. °Activity °· Rest often. °· Avoid exercises that make your heart beat much faster, such as jogging. °General instructions °· Do not use products that have nicotine or tobacco, such as cigarettes and e-cigarettes. If you need help quitting, ask your doctor. °· Consider wearing a medical alert bracelet. °· Avoid being in high places (high altitudes), such as mountains. °· Avoid very hot or cold temperatures. °· Avoid places where the  temperature changes a lot. °· Keep all follow-up visits as told by your doctor. This is important. °Contact a doctor if: °· A joint hurts. °· Your feet or hands hurt or swell. °· You feel tired (fatigued). °Get help right away if: °· You have symptoms of infection. These include: °? Fever. °? Chills. °? Being very tired. °? Irritability. °? Poor eating. °? Throwing up (vomiting). °· You feel dizzy or faint. °· You have new stomach pain, especially on the left side. °· You have a an erection (priapism) that lasts more than 4 hours. °· You have numbness in your arms or legs. °· You have a hard time moving your arms or legs. °· You have trouble talking. °· You have pain that does not go away when you take medicine. °· You are short of breath. °· You are breathing fast. °· You have a long-term cough. °· You have pain in your chest. °· You have a bad headache. °· You have a stiff neck. °· Your stomach looks bloated even though you did not eat much. °· Your skin is pale. °· You suddenly cannot see well. °Summary °· Sickle cell anemia is a condition where your red blood cells are shaped like sickles. °· Follow your doctor's advice on ways to manage pain, food to eat, activities to do, and steps to take for safe travel. °· Get medical help right away if you have any signs of infection, such as a fever. °This information is not intended to replace advice given to you by your   health care provider. Make sure you discuss any questions you have with your health care provider. °Document Released: 09/26/2013 Document Revised: 01/11/2017 Document Reviewed: 01/11/2017 °Elsevier Interactive Patient Education © 2019 [employer]. ° °

## 2019-03-01 NOTE — Progress Notes (Signed)
[facility] INTERNAL MEDICINE AND SICKLE CELL CARE  SICKLE CELL ANEMIA FOLLOW UP VISIT PROVIDER: Mike Gip, FNP    Subjective:   Hannah Hawkins  is a 33 y.o.  female who  has a past medical history of Sickle cell anemia (HCC). presents for a follow up for Sickle Cell Anemia. The patient has had 2 admissions in the past 6 months.  Pain regimen includes: Ibuprofen and oxycodone Hydrea Therapy: Yes Medication compliance: Yes   The patient reports adequate daily hydration.  She is requesting and increase in flexeril. She has been on Flexeril 5 mg and 10 mg. She is currently taking 10mg  daily.    Review of Systems  Constitutional: Negative.   HENT: Negative.   Eyes: Negative.   Respiratory: Negative.   Cardiovascular: Negative.   Gastrointestinal: Negative.   Genitourinary: Negative.   Musculoskeletal: Positive for myalgias.  Skin: Negative.   Neurological: Negative.   Psychiatric/Behavioral: Negative.     Objective:   Objective  BP 126/82 (BP Location: Left Arm, Patient Position: Sitting, Cuff Size: Small)    Pulse 88    Temp 97.8 F (36.6 C) (Oral)    Ht 5\' 2"  (1.575 m)    Wt 136 lb 12.8 oz (62.1 kg)    LMP 02/25/2019    SpO2 100%    BMI 25.02 kg/m   Wt Readings from Last 3 Encounters:  03/01/19 136 lb 12.8 oz (62.1 kg)  02/01/19 141 lb (64 kg)  01/05/19 142 lb (64.4 kg)     Physical Exam Vitals signs and nursing note reviewed.  Constitutional:      General: She is not in acute distress.    Appearance: She is well-developed.  HENT:     Head: Normocephalic and atraumatic.  Eyes:     Conjunctiva/sclera: Conjunctivae normal.     Pupils: Pupils are equal, round, and reactive to light.  Neck:     Musculoskeletal: Normal range of motion.  Cardiovascular:     Rate and Rhythm: Normal rate and regular rhythm.     Heart sounds: Normal heart sounds.  Pulmonary:     Effort: Pulmonary effort is normal. No respiratory distress.     Breath sounds: Normal  breath sounds.  Abdominal:     General: Bowel sounds are normal. There is no distension.     Palpations: Abdomen is soft.  Musculoskeletal: Normal range of motion.  Skin:    General: Skin is warm and dry.  Neurological:     Mental Status: She is alert and oriented to person, place, and time.  Psychiatric:        Behavior: Behavior normal.        Thought Content: Thought content normal.      Assessment/Plan:   Assessment   Encounter Diagnoses  Name Primary?   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 Discussed that 10mg  is the highest dose. Will try baclofen for muscle spasms.  - baclofen (LIORESAL) 10 MG tablet; Take 1 tablet (10 mg total) by mouth 3 (three) times daily as needed for muscle spasms.  Dispense: 30 each; Refill: 0 - oxyCODONE (ROXICODONE) 15 MG immediate release tablet; Take 1 tablet (15 mg total) by mouth every 6 (six) hours as needed for up to 15 days for pain.  Dispense: 60 tablet; Refill: 0  2. Follow up - POCT urinalysis dipstick  Return to care as scheduled and prn. Patient verbalized understanding and agreed with plan of care.   1. Sickle cell disease - Continue Hydrea   We discussed the need for good hydration, monitoring of hydration status, avoidance of heat, cold, stress, and infection triggers. We discussed the risks and benefits of Hydrea, including bone marrow suppression, the possibility of GI upset, skin ulcers, hair thinning, and teratogenicity. The patient was reminded of the need to seek medical attention of any symptoms of bleeding, anemia, or infection. Continue folic acid 1 mg daily to prevent aplastic bone marrow crises.   2. Pulmonary evaluation - Patient denies severe recurrent wheezes, shortness of breath with exercise, or persistent cough. If these symptoms develop, pulmonary function tests with  spirometry will be ordered, and if abnormal, plan on referral to Pulmonology for further evaluation.  3. Cardiac - Routine screening for pulmonary hypertension is not recommended.  4. Eye - High risk of proliferative retinopathy. Annual eye exam with retinal exam recommended to patient.  5. Immunization status -  Yearly influenza vaccination is recommended, as well as being up to date with Meningococcal and Pneumococcal vaccines.   6. Acute and chronic painful episodes - We discussed that pt is to receive Schedule II prescriptions only from [place]. Pt is also aware that the prescription history is available to [place] online through the [facility] CSRS. Controlled substance agreement signed. We reminded Gage Nagle that all patients receiving Schedule II narcotics must be seen for follow within one month of prescription being requested. We reviewed the terms of our pain agreement, including the need to keep medicines in a safe locked location away from children or pets, and the need to report excess sedation or constipation, measures to avoid constipation, and policies related to early refills and stolen prescriptions. According to the [place] [facility] program, we have reviewed details related to analgesia, adverse effects, aberrant behaviors.  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  Not applicable at this time.  If this occurs will use Exjade for management.   8. Vitamin D deficiency - Drisdol 50,000 units weekly. Patient encouraged to take as prescribed.   The above recommendations are taken from the NIH Evidence-Based Management of Sickle Cell Disease: Expert Panel Report, 16010.   Ms. Andr L. Riley Lam, FNP-BC [facility] [address] [place], [place] [address] [phone]  This note has been created with Dragon speech recognition software and smart phrase technology. Any transcriptional errors are unintentional.

## 2019-03-02 LAB — DRUG SCREEN 8 W/CONF, UR
Amphetamines, Urine: NEGATIVE ng/mL
BENZODIAZ UR QL: NEGATIVE ng/mL
Barbiturate screen, urine: NEGATIVE ng/mL
Buprenorphine, Urine: NEGATIVE ng/mL
Cannabinoid Quant, Ur: NEGATIVE ng/mL
Cocaine (Metab.): NEGATIVE ng/mL
Methadone Screen, Urine: NEGATIVE ng/mL
OPIATE SCREEN URINE: NEGATIVE ng/mL

## 2019-03-05 ENCOUNTER — Encounter

## 2019-03-05 ENCOUNTER — Ambulatory Visit: Payer: Medicaid Other | Admitting: Neurology

## 2019-03-05 NOTE — Progress Notes (Deleted)
[facility] Clinic Note - Initial Visit   Date: 03/05/19  Hannah Hawkins MRN: [id number] DOB: Apr 15, 1986   Dear Hannah Pauls, FNP:  Thank you for your kind referral of Hannah Hawkins for consultation of left hand numbness. Although her history is well known to you, please allow [place] to reiterate it for the purpose of our medical record. The patient was accompanied to the clinic by *** who also provides collateral information.     History of Present Illness: Hannah Hawkins is a 33 y.o. ***-handed African American female with sickle cell anemia, *** presenting for evaluation of left hand numbness.    Out-side paper records, electronic medical record, and images have been reviewed where available and summarized as: *** No results found for: HGBA1C No results found for: VITAMINB12 Lab Results  Component Value Date   TSH 0.56 09/30/2017   No results found for: ESRSEDRATE, POCTSEDRATE  Past Medical History:  Diagnosis Date  . Sickle cell anemia (HCC)     Past Surgical History:  Procedure Laterality Date  . EYE SURGERY  03/09/2017   left eye     Medications:  Outpatient Encounter Medications as of 03/05/2019  Medication Sig  . baclofen (LIORESAL) 10 MG tablet Take 1 tablet (10 mg total) by mouth 3 (three) times daily as needed for muscle spasms.  . ergocalciferol (VITAMIN D2) 1.25 MG (50000 UT) capsule Take 1 capsule (50,000 Units total) by mouth once a week. (Patient not taking: Reported on 03/01/2019)  . folic acid (FOLVITE) 1 MG tablet Take 1 tablet (1 mg total) by mouth daily. (Patient not taking: Reported on 02/28/2018)  . hydroxyurea (HYDREA) 500 MG capsule Take 2 capsules (1,000 mg total) by mouth daily.  Marland Kitchen ibuprofen (ADVIL,MOTRIN) 800 MG tablet Take 1 tablet (800 mg total) by mouth every 8 (eight) hours as needed for moderate pain.  Marland Kitchen levocetirizine (XYZAL) 5 MG tablet TAKE 1 TABLET(5 MG) BY MOUTH EVERY EVENING  . Multiple Vitamin  (MULTIVITAMIN WITH MINERALS) TABS tablet Take 1 tablet by mouth daily.  Marland Kitchen NARCAN 4 MG/0.1ML LIQD nasal spray kit Place 4 mg into the nose once as needed. overdose  . oxyCODONE (ROXICODONE) 15 MG immediate release tablet Take 1 tablet (15 mg total) by mouth every 6 (six) hours as needed for up to 15 days for pain.  . pantoprazole (PROTONIX) 40 MG tablet Take 1 tablet (40 mg total) by mouth daily.   No facility-administered encounter medications on file as of 03/05/2019.     Allergies:  Allergies  Allergen Reactions  . Penicillins Swelling and Rash    Has patient had a PCN reaction causing immediate rash, facial/tongue/throat swelling, SOB or lightheadedness with hypotension: Yes Has patient had a PCN reaction causing severe rash involving mucus membranes or skin necrosis: Yes Has patient had a PCN reaction that required hospitalization No Has patient had a PCN reaction occurring within the last 10 years: no If all of the above answers are "NO", then may proceed with Cephalosporin use.     Family History: No family history on file.  Social History: Social History   Tobacco Use  . Smoking status: Former Research scientist (life sciences)  . Smokeless tobacco: Never Used  Substance Use Topics  . Alcohol use: Yes    Comment: Twice a month  . Drug use: No   Social History   Social History Narrative  . Not on file    Review of Systems:  CONSTITUTIONAL: No fevers, chills, night sweats, or weight loss.  ***  EYES: No visual changes or eye pain ENT: No hearing changes.  No history of nose bleeds.   RESPIRATORY: No cough, wheezing and shortness of breath.   CARDIOVASCULAR: Negative for chest pain, and palpitations.   GI: Negative for abdominal discomfort, blood in stools or black stools.  No recent change in bowel habits.   GU:  No history of incontinence.   MUSCLOSKELETAL: No history of joint pain or swelling.  No myalgias.   SKIN: Negative for lesions, rash, and itching.   HEMATOLOGY/ONCOLOGY: Negative  for prolonged bleeding, bruising easily, and swollen nodes.  No history of cancer.   ENDOCRINE: Negative for cold or heat intolerance, polydipsia or goiter.   PSYCH:  ***depression or anxiety symptoms.   NEURO: As Above.   Vital Signs:  LMP 02/25/2019    General Medical Exam:  *** General:  Well appearing, comfortable.   Eyes/ENT: see cranial nerve examination.   Neck:   No carotid bruits. Respiratory:  Clear to auscultation, good air entry bilaterally.   Cardiac:  Regular rate and rhythm, no murmur.   Extremities:  No deformities, edema, or skin discoloration.  Skin:  No rashes or lesions.  Neurological Exam: MENTAL STATUS including orientation to time, place, person, recent and remote memory, attention span and concentration, language, and fund of knowledge is ***normal.  Speech is not dysarthric.  CRANIAL NERVES: II:  No visual field defects.  Unremarkable fundi.   III-IV-VI: Pupils equal round and reactive to light.  Normal conjugate, extra-ocular eye movements in all directions of gaze.  No nystagmus.  No ptosis***.   V:  Normal facial sensation.    VII:  Normal facial symmetry and movements.   VIII:  Normal hearing and vestibular function.   IX-X:  Normal palatal movement.   XI:  Normal shoulder shrug and head rotation.   XII:  Normal tongue strength and range of motion, no deviation or fasciculation.  MOTOR:  No atrophy, fasciculations or abnormal movements.  No pronator drift.   Upper Extremity:  Right  Left  Deltoid  5/5   5/5   Biceps  5/5   5/5   Triceps  5/5   5/5   Infraspinatus 5/5  5/5  Medial pectoralis 5/5  5/5  Wrist extensors  5/5   5/5   Wrist flexors  5/5   5/5   Finger extensors  5/5   5/5   Finger flexors  5/5   5/5   Dorsal interossei  5/5   5/5   Abductor pollicis  5/5   5/5   Tone (Ashworth scale)  0  0   Lower Extremity:  Right  Left  Hip flexors  5/5   5/5   Hip extensors  5/5   5/5   Adductor 5/5  5/5  Abductor 5/5  5/5  Knee flexors   5/5   5/5   Knee extensors  5/5   5/5   Dorsiflexors  5/5   5/5   Plantarflexors  5/5   5/5   Toe extensors  5/5   5/5   Toe flexors  5/5   5/5   Tone (Ashworth scale)  0  0   MSRs:  Right        Left                  brachioradialis 2+  2+  biceps 2+  2+  triceps 2+  2+  patellar 2+  2+  ankle jerk 2+  2+  Hoffman  no  no  plantar response down  down   SENSORY:  Normal and symmetric perception of light touch, pinprick, vibration, and proprioception.  Romberg's sign absent.   COORDINATION/GAIT: Normal finger-to- nose-finger and heel-to-shin.  Intact rapid alternating movements bilaterally.  Able to rise from a chair without using arms.  Gait narrow based and stable. Tandem and stressed gait intact.    IMPRESSION: ***  PLAN/RECOMMENDATIONS:  *** Return to clinic in *** months.    Thank you for allowing me to participate in patient's care.  If I can answer any additional questions, I would be pleased to do so.    Sincerely,     K. Posey Pronto, DO

## 2019-03-12 ENCOUNTER — Other Ambulatory Visit: Payer: Self-pay

## 2019-03-12 DIAGNOSIS — D5720 Sickle-cell/Hb-C disease without crisis: Secondary | ICD-10-CM

## 2019-03-15 ENCOUNTER — Encounter (HOSPITAL_COMMUNITY): Payer: Self-pay | Admitting: General Practice

## 2019-03-15 ENCOUNTER — Ambulatory Visit: Payer: Medicaid Other | Admitting: Family Medicine

## 2019-03-15 ENCOUNTER — Non-Acute Institutional Stay (HOSPITAL_COMMUNITY)
Admission: AD | Admit: 2019-03-15 | Discharge: 2019-03-15 | Disposition: A | Discharge status: Home / Self Care | Payer: Medicaid Other | Source: Ambulatory Visit | Attending: Internal Medicine | Admitting: Internal Medicine

## 2019-03-15 ENCOUNTER — Other Ambulatory Visit: Payer: Self-pay | Admitting: Family Medicine

## 2019-03-15 ENCOUNTER — Telehealth (HOSPITAL_COMMUNITY): Payer: Self-pay | Admitting: *Deleted

## 2019-03-15 DIAGNOSIS — Z87891 Personal history of nicotine dependence: Secondary | ICD-10-CM | POA: Insufficient documentation

## 2019-03-15 DIAGNOSIS — D5700 Hb-SS disease with crisis, unspecified: Secondary | ICD-10-CM | POA: Diagnosis present

## 2019-03-15 DIAGNOSIS — G894 Chronic pain syndrome: Secondary | ICD-10-CM | POA: Insufficient documentation

## 2019-03-15 DIAGNOSIS — Z79899 Other long term (current) drug therapy: Secondary | ICD-10-CM | POA: Insufficient documentation

## 2019-03-15 LAB — RAPID URINE DRUG SCREEN, HOSP PERFORMED
Amphetamines: NOT DETECTED
Barbiturates: NOT DETECTED
Benzodiazepines: NOT DETECTED
Cocaine: NOT DETECTED
Opiates: NOT DETECTED
Tetrahydrocannabinol: NOT DETECTED

## 2019-03-15 LAB — CBC WITH DIFFERENTIAL/PLATELET
Abs Immature Granulocytes: 0.03 10*3/uL (ref 0.00–0.07)
Basophils Absolute: 0.1 10*3/uL (ref 0.0–0.1)
Basophils Relative: 1 %
Eosinophils Absolute: 0.4 10*3/uL (ref 0.0–0.5)
Eosinophils Relative: 5 %
HCT: 30.8 % — ABNORMAL LOW (ref 36.0–46.0)
Hemoglobin: 11.2 g/dL — ABNORMAL LOW (ref 12.0–15.0)
Immature Granulocytes: 0 %
Lymphocytes Relative: 33 %
Lymphs Abs: 2.7 10*3/uL (ref 0.7–4.0)
MCH: 28.5 pg (ref 26.0–34.0)
MCHC: 36.4 g/dL — ABNORMAL HIGH (ref 30.0–36.0)
MCV: 78.4 fL — ABNORMAL LOW (ref 80.0–100.0)
Monocytes Absolute: 0.7 10*3/uL (ref 0.1–1.0)
Monocytes Relative: 9 %
NEUTROS ABS: 4.2 10*3/uL (ref 1.7–7.7)
Neutrophils Relative %: 52 %
Platelets: 365 10*3/uL (ref 150–400)
RBC: 3.93 MIL/uL (ref 3.87–5.11)
RDW: 14 % (ref 11.5–15.5)
WBC: 8.1 10*3/uL (ref 4.0–10.5)
nRBC: 2 % — ABNORMAL HIGH (ref 0.0–0.2)

## 2019-03-15 LAB — COMPREHENSIVE METABOLIC PANEL
ALT: 11 U/L (ref 0–44)
AST: 17 U/L (ref 15–41)
Albumin: 3.7 g/dL (ref 3.5–5.0)
Alkaline Phosphatase: 45 U/L (ref 38–126)
Anion gap: 8 (ref 5–15)
BUN: 9 mg/dL (ref 6–20)
CO2: 27 mmol/L (ref 22–32)
Calcium: 8.7 mg/dL — ABNORMAL LOW (ref 8.9–10.3)
Chloride: 103 mmol/L (ref 98–111)
Creatinine, Ser: 0.72 mg/dL (ref 0.44–1.00)
GFR calc Af Amer: >60 mL/min (ref >60)
Glucose, Bld: 91 mg/dL (ref 70–99)
Potassium: 3.3 mmol/L — ABNORMAL LOW (ref 3.5–5.1)
Sodium: 138 mmol/L (ref 135–145)
Total Bilirubin: 0.7 mg/dL (ref 0.3–1.2)
Total Protein: 6.9 g/dL (ref 6.5–8.1)

## 2019-03-15 LAB — URINALYSIS, COMPLETE (UACMP) WITH MICROSCOPIC
Bacteria, UA: NONE SEEN
Bilirubin Urine: NEGATIVE
GLUCOSE, UA: NEGATIVE mg/dL
Hgb urine dipstick: NEGATIVE
Ketones, ur: NEGATIVE mg/dL
Leukocytes,Ua: NEGATIVE
Nitrite: NEGATIVE
PROTEIN: NEGATIVE mg/dL
Specific Gravity, Urine: 1.023 (ref 1.005–1.030)
pH: 5 (ref 5.0–8.0)

## 2019-03-15 LAB — RETICULOCYTES
Immature Retic Fract: 30.7 % — ABNORMAL HIGH (ref 2.3–15.9)
RBC.: 3.93 MIL/uL (ref 3.87–5.11)
RETIC CT PCT: 2.7 % (ref 0.4–3.1)
Retic Count, Absolute: 107.7 10*3/uL (ref 19.0–186.0)

## 2019-03-15 LAB — PREGNANCY, URINE: Preg Test, Ur: NEGATIVE

## 2019-03-15 MED ORDER — ONDANSETRON HCL 4 MG/2ML IJ SOLN
4.0000 mg | Freq: Once | INTRAMUSCULAR | Status: AC
  Administered 2019-03-15: 4 mg via INTRAVENOUS
  Filled 2019-03-15: qty 2

## 2019-03-15 MED ORDER — DIPHENHYDRAMINE HCL 12.5 MG/5ML PO ELIX: 25.0000 mg | ORAL_SOLUTION | Freq: Once | ORAL | Status: DC

## 2019-03-15 MED ORDER — HYDROMORPHONE HCL 2 MG/ML IJ SOLN
2.0000 mg | Freq: Once | INTRAMUSCULAR | Status: AC
  Administered 2019-03-15: 2 mg via INTRAVENOUS
  Filled 2019-03-15: qty 1

## 2019-03-15 MED ORDER — OXYCODONE HCL 5 MG PO TABS
20.0000 mg | ORAL_TABLET | Freq: Once | ORAL | Status: AC
  Administered 2019-03-15: 20 mg via ORAL
  Filled 2019-03-15: qty 4

## 2019-03-15 MED ORDER — KETOROLAC TROMETHAMINE 30 MG/ML IJ SOLN
15.0000 mg | Freq: Once | INTRAMUSCULAR | Status: AC
  Administered 2019-03-15: 15 mg via INTRAVENOUS
  Filled 2019-03-15: qty 1

## 2019-03-15 MED ORDER — CLINDAMYCIN HCL 300 MG PO CAPS: 300.0000 mg | ORAL_CAPSULE | Freq: Three times a day (TID) | ORAL | 0 refills | Status: AC

## 2019-03-15 MED ORDER — ACETAMINOPHEN 500 MG PO TABS
1000.0000 mg | ORAL_TABLET | Freq: Once | ORAL | Status: AC
  Administered 2019-03-15: 1000 mg via ORAL
  Filled 2019-03-15: qty 2

## 2019-03-15 MED ORDER — POLYETHYLENE GLYCOL 3350 17 G PO PACK: 17.0000 g | PACK | Freq: Every day | ORAL | Status: DC | PRN

## 2019-03-15 MED ORDER — HYDROMORPHONE HCL 1 MG/ML IJ SOLN
1.0000 mg | Freq: Once | INTRAMUSCULAR | Status: AC
  Administered 2019-03-15: 1 mg via SUBCUTANEOUS
  Filled 2019-03-15: qty 1

## 2019-03-15 MED ORDER — SENNOSIDES-DOCUSATE SODIUM 8.6-50 MG PO TABS: 1.0000 | ORAL_TABLET | Freq: Two times a day (BID) | ORAL | Status: DC

## 2019-03-15 MED ORDER — DEXTROSE-NACL 5-0.45 % IV SOLN
INTRAVENOUS | Status: DC
  Administered 2019-03-15: 13:00:00 via INTRAVENOUS

## 2019-03-15 NOTE — Progress Notes (Signed)
clea

## 2019-03-15 NOTE — H&P (Signed)
[facility] History and Physical   Date: 03/15/2019  Patient name: Hannah Hawkins Medical record number: [id number] Date of birth: [birth year] Age: 33 y.o. Gender: female PCP: Lanae Boast, [place]  Attending physician: Tresa Garter, MD  Chief Complaint: Bilateral lower extremity pain  History of Present Illness: Hannah Hawkins, a 33 year old female with a medical history significant for sickle cell anemia, chronic pain syndrome, and history of polysubstance abuse presented complaining of pain to bilateral lower extremities that is consistent with typical sickle cell pain crisis.  Patient states that pain started this a.m. upon awakening.  She took ibuprofen and Baclofen without sustained relief.  Patient states that she is oxycodone, she took her last tablet several days ago.  Current pain intensity is 9/10 characterized as constant and aching. Patient denies fever, chills, persistent cough, or shortness of breath. She also denies headache, chest pain, nausea, vomiting, or diarrhea.  Meds: Medications Prior to Admission  Medication Sig Dispense Refill Last Dose  . baclofen (LIORESAL) 10 MG tablet Take 1 tablet (10 mg total) by mouth 3 (three) times daily as needed for muscle spasms. 30 each 0   . ergocalciferol (VITAMIN D2) 1.25 MG (50000 UT) capsule Take 1 capsule (50,000 Units total) by mouth once a week. (Patient not taking: Reported on 03/01/2019) 12 capsule 3 Not Taking  . folic acid (FOLVITE) 1 MG tablet Take 1 tablet (1 mg total) by mouth daily. (Patient not taking: Reported on 02/28/2018) 30 tablet 2 Not Taking  . hydroxyurea (HYDREA) 500 MG capsule Take 2 capsules (1,000 mg total) by mouth daily. 60 capsule 2 Taking  . ibuprofen (ADVIL,MOTRIN) 800 MG tablet Take 1 tablet (800 mg total) by mouth every 8 (eight) hours as needed for moderate pain. 30 tablet 5 Taking  . levocetirizine (XYZAL) 5 MG tablet TAKE 1 TABLET(5 MG) BY MOUTH EVERY EVENING 90 tablet 0 Taking   . Multiple Vitamin (MULTIVITAMIN WITH MINERALS) TABS tablet Take 1 tablet by mouth daily.   Taking  . NARCAN 4 MG/0.1ML LIQD nasal spray kit Place 4 mg into the nose once as needed. overdose  0 Taking  . oxyCODONE (ROXICODONE) 15 MG immediate release tablet Take 1 tablet (15 mg total) by mouth every 6 (six) hours as needed for up to 15 days for pain. 60 tablet 0   . pantoprazole (PROTONIX) 40 MG tablet Take 1 tablet (40 mg total) by mouth daily. 30 tablet 3 Taking    Allergies: Penicillins Past Medical History:  Diagnosis Date  . Sickle cell anemia (HCC)    Past Surgical History:  Procedure Laterality Date  . EYE SURGERY  03/09/2017   left eye   No family history on file. Social History   Socioeconomic History  . Marital status: Single    Spouse name: Not on file  . Number of children: Not on file  . Years of education: Not on file  . Highest education level: Not on file  Occupational History  . Not on file  Social Needs  . Financial resource strain: Not on file  . Food insecurity:    Worry: Not on file    Inability: Not on file  . Transportation needs:    Medical: Not on file    Non-medical: Not on file  Tobacco Use  . Smoking status: Former Research scientist (life sciences)  . Smokeless tobacco: Never Used  Substance and Sexual Activity  . Alcohol use: Yes    Comment: Twice a month  . Drug use: No  .  Sexual activity: Yes  Lifestyle  . Physical activity:    Days per week: Not on file    Minutes per session: Not on file  . Stress: Not on file  Relationships  . Social connections:    Talks on phone: Not on file    Gets together: Not on file    Attends religious service: Not on file    Active member of club or organization: Not on file    Attends meetings of clubs or organizations: Not on file    Relationship status: Not on file  . Intimate partner violence:    Fear of current or ex partner: Not on file    Emotionally abused: Not on file    Physically abused: Not on file    Forced  sexual activity: Not on file  Other Topics Concern  . Not on file  Social History Narrative  . Not on file   Review of Systems  Constitutional: Negative for chills and fever.  HENT: Negative.   Respiratory: Negative for cough.   Cardiovascular: Negative for chest pain, palpitations and PND.  Gastrointestinal: Negative for diarrhea, heartburn and nausea.  Genitourinary: Negative.   Musculoskeletal: Positive for joint pain and myalgias.  Skin: Negative.   Endo/Heme/Allergies: Negative.   Psychiatric/Behavioral: Negative.      Physical Exam: Last menstrual period 02/25/2019. Physical Exam Constitutional:      General: She is not in acute distress.    Appearance: She is not toxic-appearing.  HENT:     Mouth/Throat:     Pharynx: Oropharynx is clear.  Eyes:     Pupils: Pupils are equal, round, and reactive to light.  Neck:     Musculoskeletal: Normal range of motion.  Cardiovascular:     Rate and Rhythm: Normal rate and regular rhythm.  Pulmonary:     Effort: Pulmonary effort is normal.     Breath sounds: Normal breath sounds.  Abdominal:     Palpations: Abdomen is soft.  Musculoskeletal: Normal range of motion.  Skin:    General: Skin is warm and dry.     Coloration: Skin is not jaundiced.  Neurological:     General: No focal deficit present.     Mental Status: She is oriented to person, place, and time.  Psychiatric:        Mood and Affect: Mood normal.        Behavior: Behavior normal.        Thought Content: Thought content normal.        Judgment: Judgment normal.      Lab results: No results found for this or any previous visit (from the past 24 hour(s)).  Imaging results:  No results found.   Assessment & Plan:  Patient will be admitted to the day infusion center for extended observation  Initiate D5.45% saline at 125 ml/hour for cellular rehydration  Toradol 15 mg IV times one for inflammation.  Tylenol 1000 mg times one  Dilaudid 2 mg IV  times one    Patient will be re-evaluated for pain intensity in the context of function and relationship to baseline as care progresses.  If no significant pain relief, will transfer patient to inpatient services for a higher level of care.    Review CBC with differential, UDS, Urine pregnancy, CMP and reticulocytes as results become available   Donia Pounds  APRN, MSN, FNP-C Patient [facility] [address] [place], [place] [address] [phone] 03/15/2019, 12:28 PM

## 2019-03-15 NOTE — Telephone Encounter (Signed)
Patient called requesting to come to the day infusion hospital for sickle cell pain crisis. Patient reports bilateral leg pain rated 10/10. Reports last taking prescribed pain medications two hours ago. COVID-19 screening done and patient denied all symptoms. Denies fever, chest pain, nausea, vomiting and diarrhea. Does admit to having on and off abdominal pain for past few days but denies currently having abdominal pain. Patient admits to having means of transportation at discharge without driving self. [place], FNP notified. Patient can come to the day hospital for pain management. Patient advised and expresses an understanding.

## 2019-03-15 NOTE — Discharge Summary (Signed)
[facility] Discharge Summary   Patient ID: Hannah Hawkins MRN: [id number] DOB/AGE: 09/16/1986 33 y.o.  Admit date: 03/15/2019 Discharge date: 03/15/2019  Primary Care Physician:  Lanae Boast, [place]  Admission Diagnoses:  Active Problems:   Sickle cell pain crisis [facility])    Discharge Medications:  Allergies as of 03/15/2019      Reactions   Penicillins Swelling, Rash   Has patient had a PCN reaction causing immediate rash, facial/tongue/throat swelling, SOB or lightheadedness with hypotension: Yes Has patient had a PCN reaction causing severe rash involving mucus membranes or skin necrosis: Yes Has patient had a PCN reaction that required hospitalization No Has patient had a PCN reaction occurring within the last 10 years: no If all of the above answers are "NO", then may proceed with Cephalosporin use.      Medication List    TAKE these medications   baclofen 10 MG tablet Commonly known as:  LIORESAL Take 1 tablet (10 mg total) by mouth 3 (three) times daily as needed for muscle spasms.   ergocalciferol 1.25 MG (50000 UT) capsule Commonly known as:  VITAMIN D2 Take 1 capsule (50,000 Units total) by mouth once a week.   folic acid 1 MG tablet Commonly known as:  FOLVITE Take 1 tablet (1 mg total) by mouth daily.   hydroxyurea 500 MG capsule Commonly known as:  HYDREA Take 2 capsules (1,000 mg total) by mouth daily.   ibuprofen 800 MG tablet Commonly known as:  ADVIL,MOTRIN Take 1 tablet (800 mg total) by mouth every 8 (eight) hours as needed for moderate pain.   levocetirizine 5 MG tablet Commonly known as:  XYZAL TAKE 1 TABLET(5 MG) BY MOUTH EVERY EVENING   multivitamin with minerals Tabs tablet Take 1 tablet by mouth daily.   Narcan 4 MG/0.1ML Liqd nasal spray kit Generic drug:  naloxone Place 4 mg into the nose once as needed. overdose   oxyCODONE 15 MG immediate release tablet Commonly known as:  ROXICODONE Take 1 tablet (15 mg total)  by mouth every 6 (six) hours as needed for up to 15 days for pain.   pantoprazole 40 MG tablet Commonly known as:  PROTONIX Take 1 tablet (40 mg total) by mouth daily.        Consults:  None  Significant Diagnostic Studies:  No results found.   [facility] Course: Hannah Hawkins, a 33 year old female with a medical history significant for sickle cell anemia, chronic pain syndrome, and history of polysubstance abuse was admitted to sickle cell day infusion center for pain management and extended observation. All laboratory values reviewed, consistent with patient's baseline. Patient afebrile and maintaining oxygen saturation above 90% on RA.  D5 0.45% saline at 125 mL/h Toradol 15 mg IV x1 Tylenol 1000 mg by mouth x1 Dilaudid 1 mg SQ x1 Dilaudid 2 mg IV x1 Oxycodone 20 mg by mouth x1 Pain intensity decreased to 3/10. Patient alert, oriented, and ambulating without assistance.  Patient states that she can manage at home on current medication regimen. Patient advised to follow-up with primary care as scheduled. Patient will discharge home with family in a hemodynamically stable condition.  Discharge instructions: Resume all medications Continue to hydrate with 64 ounces of fluid Avoid all stressors that precipitate sickle cell pain crisis Strict return precautions Increase rest, handwashing, avoid crowds, avoid gatherings, and avoid sick contacts.   The patient was given clear instructions to go to ER or return to medical center if symptoms do not  improve, worsen or new problems develop. The patient verbalized understanding.    Physical Exam at Discharge:  BP 119/83 (BP Location: Left Arm)   Pulse 93   Temp 98.3 F (36.8 C) (Oral)   Resp 16   LMP 02/25/2019   SpO2 93%  Physical Exam Cardiovascular:     Rate and Rhythm: Normal rate and regular rhythm.     Pulses: Normal pulses.     Heart sounds: Normal heart sounds.  Pulmonary:     Effort:  Pulmonary effort is normal.     Breath sounds: Normal breath sounds.  Abdominal:     General: Abdomen is flat.     Palpations: Abdomen is soft.  Skin:    General: Skin is warm and dry.  Neurological:     General: No focal deficit present.     Mental Status: She is alert. Mental status is at baseline.  Psychiatric:        Mood and Affect: Mood normal.        Behavior: Behavior normal.        Thought Content: Thought content normal.        Judgment: Judgment normal.      Disposition at Discharge: Discharge disposition: 01-Home or Self Care       Discharge Orders: Discharge Instructions    Discharge patient   Complete by:  As directed    Discharge disposition:  01-Home or Self Care   Discharge patient date:  03/15/2019      Condition at Discharge:   Stable  Time spent on Discharge:  Greater than 30 minutes.  Signed:   Donia Pounds  APRN, MSN, FNP-C [facility] [employer] [facility] [address] [place], [place] [address] [phone]   03/15/2019, 4:11 PM

## 2019-03-15 NOTE — Discharge Instructions (Signed)
Resume all home medications, continue to hydrate with 64 ounces of fluid, avoid all stressors that precipitate sickle cell pain crisis.  Calling your pain medication prescription as instructed.  Strict return precautions.  All of your labs are consistent with baseline.  Sickle Cell Anemia, Adult Sickle cell anemia is a condition where your red blood cells are shaped like sickles. Red blood cells carry oxygen through the body. Sickle-shaped cells do not live as long as normal red blood cells. They also clump together and block blood from flowing through the blood vessels. This prevents the body from getting enough oxygen. Sickle cell anemia causes organ damage and pain. It also increases the risk of infection. Follow these instructions at home: Medicines  Take over-the-counter and prescription medicines only as told by your doctor.  If you were prescribed an antibiotic medicine, take it as told by your doctor. Do not stop taking the antibiotic even if you start to feel better.  If you develop a fever, do not take medicines to lower the fever right away. Tell your doctor about the fever. Managing pain, stiffness, and swelling  Try these methods to help with pain: ? Use a heating pad. ? Take a warm bath. ? Distract yourself, such as by watching TV. Eating and drinking  Drink enough fluid to keep your pee (urine) clear or pale yellow. Drink more in hot weather and during exercise.  Limit or avoid alcohol.  Eat a healthy diet. Eat plenty of fruits, vegetables, whole grains, and lean protein.  Take vitamins and supplements as told by your doctor. Traveling  When traveling, keep these with you: ? Your medical information. ? The names of your doctors. ? Your medicines.  If you need to take an airplane, talk to your doctor first. Activity  Rest often.  Avoid exercises that make your heart beat much faster, such as jogging. General instructions  Do not use products that have nicotine  or tobacco, such as cigarettes and e-cigarettes. If you need help quitting, ask your doctor.  Consider wearing a medical alert bracelet.  Avoid being in high places (high altitudes), such as mountains.  Avoid very hot or cold temperatures.  Avoid places where the temperature changes a lot.  Keep all follow-up visits as told by your doctor. This is important. Contact a doctor if:  A joint hurts.  Your feet or hands hurt or swell.  You feel tired (fatigued). Get help right away if:  You have symptoms of infection. These include: ? Fever. ? Chills. ? Being very tired. ? Irritability. ? Poor eating. ? Throwing up (vomiting).  You feel dizzy or faint.  You have new stomach pain, especially on the left side.  You have a an erection (priapism) that lasts more than 4 hours.  You have numbness in your arms or legs.  You have a hard time moving your arms or legs.  You have trouble talking.  You have pain that does not go away when you take medicine.  You are short of breath.  You are breathing fast.  You have a long-term cough.  You have pain in your chest.  You have a bad headache.  You have a stiff neck.  Your stomach looks bloated even though you did not eat much.  Your skin is pale.  You suddenly cannot see well. Summary  Sickle cell anemia is a condition where your red blood cells are shaped like sickles.  Follow your doctor's advice on ways to manage pain,  food to eat, activities to do, and steps to take for safe travel.  Get medical help right away if you have any signs of infection, such as a fever. This information is not intended to replace advice given to you by your health care provider. Make sure you discuss any questions you have with your health care provider. Document Released: 09/26/2013 Document Revised: 01/11/2017 Document Reviewed: 01/11/2017 Elsevier Interactive Patient Education  2019 [employer].

## 2019-03-15 NOTE — Progress Notes (Signed)
Patient admitted to the day hospital for treatment of sickle cell pain crisis. Patient reported right leg pain rated 10/10. Patient given IV Toradol, PO Tylenol, Dilaudid IV,  Dilaudid sub Q, PO Oxycodone  and hydrated with IV fluids. At discharge patient reported her pain at 5/10. Discharge instructions given to patient. Patient alert, oriented and ambulatory at discharge.

## 2019-03-16 ENCOUNTER — Other Ambulatory Visit: Payer: Self-pay

## 2019-03-16 DIAGNOSIS — D5720 Sickle-cell/Hb-C disease without crisis: Secondary | ICD-10-CM

## 2019-03-16 MED ORDER — OXYCODONE HCL 15 MG PO TABS: 15.0000 mg | ORAL_TABLET | Freq: Four times a day (QID) | ORAL | 0 refills | Status: DC | PRN

## 2019-03-16 MED ORDER — BACLOFEN 10 MG PO TABS: 10.0000 mg | ORAL_TABLET | Freq: Three times a day (TID) | ORAL | 0 refills | Status: DC | PRN

## 2019-03-21 ENCOUNTER — Telehealth: Payer: Self-pay

## 2019-03-21 DIAGNOSIS — R110 Nausea: Secondary | ICD-10-CM

## 2019-03-21 MED ORDER — ONDANSETRON HCL 4 MG PO TABS: 4.0000 mg | ORAL_TABLET | Freq: Three times a day (TID) | ORAL | 2 refills | Status: DC | PRN

## 2019-03-23 ENCOUNTER — Ambulatory Visit: Payer: Medicaid Other | Admitting: Neurology

## 2019-03-23 ENCOUNTER — Telehealth: Payer: Self-pay

## 2019-03-27 NOTE — Telephone Encounter (Signed)
Patient states that all the medication that she is on is throwing her PH balance off and would like to have something sent in to get her balance. Patient states that her discharge was thick and she was having some itching. Patient is aware that you are out of office and would like to have something sent into [facility] on [address].

## 2019-03-28 ENCOUNTER — Telehealth: Payer: Self-pay

## 2019-03-28 DIAGNOSIS — D5720 Sickle-cell/Hb-C disease without crisis: Secondary | ICD-10-CM

## 2019-03-28 NOTE — Telephone Encounter (Signed)
Left a vm for patient to callback 

## 2019-03-28 NOTE — Telephone Encounter (Signed)
Will send diflucan to the pharmacy for this patient. If not resolved, she will need to come in for vaginits and STD swab. Please make sure that she is not douching or using scented soaps or gels to clean her vagina as this can also cause a change in her pH. Thanks

## 2019-03-29 ENCOUNTER — Ambulatory Visit: Payer: Medicaid Other | Admitting: Family Medicine

## 2019-03-29 MED ORDER — OXYCODONE HCL 15 MG PO TABS: 15.0000 mg | ORAL_TABLET | Freq: Four times a day (QID) | ORAL | 0 refills | Status: DC | PRN

## 2019-03-29 MED ORDER — BACLOFEN 10 MG PO TABS: 10.0000 mg | ORAL_TABLET | Freq: Three times a day (TID) | ORAL | 0 refills | Status: DC | PRN

## 2019-03-29 NOTE — Telephone Encounter (Signed)
Patient notified

## 2019-03-29 NOTE — Telephone Encounter (Signed)
Refilled

## 2019-04-06 NOTE — Telephone Encounter (Signed)
error 

## 2019-04-09 ENCOUNTER — Ambulatory Visit: Payer: Medicaid Other | Admitting: Family Medicine

## 2019-04-11 ENCOUNTER — Other Ambulatory Visit: Payer: Self-pay

## 2019-04-11 ENCOUNTER — Ambulatory Visit (INDEPENDENT_AMBULATORY_CARE_PROVIDER_SITE_OTHER): Payer: Medicaid Other | Admitting: Family Medicine

## 2019-04-11 ENCOUNTER — Encounter: Payer: Self-pay | Admitting: Family Medicine

## 2019-04-11 DIAGNOSIS — D5720 Sickle-cell/Hb-C disease without crisis: Secondary | ICD-10-CM

## 2019-04-11 DIAGNOSIS — E876 Hypokalemia: Secondary | ICD-10-CM | POA: Diagnosis not present

## 2019-04-11 DIAGNOSIS — R110 Nausea: Secondary | ICD-10-CM

## 2019-04-11 DIAGNOSIS — J301 Allergic rhinitis due to pollen: Secondary | ICD-10-CM

## 2019-04-11 MED ORDER — ONDANSETRON HCL 8 MG PO TABS: 8.0000 mg | ORAL_TABLET | Freq: Three times a day (TID) | ORAL | 1 refills | Status: DC | PRN

## 2019-04-11 MED ORDER — LEVOCETIRIZINE DIHYDROCHLORIDE 5 MG PO TABS: ORAL_TABLET | ORAL | 1 refills | Status: DC

## 2019-04-11 MED ORDER — BACLOFEN 10 MG PO TABS: 10.0000 mg | ORAL_TABLET | Freq: Three times a day (TID) | ORAL | 1 refills | Status: DC | PRN

## 2019-04-11 MED ORDER — OXYCODONE HCL 15 MG PO TABS: 15.0000 mg | ORAL_TABLET | Freq: Four times a day (QID) | ORAL | 0 refills | Status: DC | PRN

## 2019-04-11 MED ORDER — CULTURELLE PO CAPS: 1.0000 | ORAL_CAPSULE | Freq: Every day | ORAL | 0 refills | Status: DC

## 2019-04-11 NOTE — Progress Notes (Signed)
  [facility]  Virtual Visit via Telephone Note  I connected with Hannah Hawkins on 04/11/19 at  2:40 PM EDT by telephone and verified that I am speaking with the correct person using two identifiers.   I discussed the limitations, risks, security and privacy concerns of performing an evaluation and management service by telephone and the availability of in person appointments. I also discussed with the patient that there may be a patient responsible charge related to this service. The patient expressed understanding and agreed to proceed.   History of Present Illness: Hannah Hawkins  has a past medical history of Sickle cell anemia (HCC). Patient states that she is having frequent, watery stools for the past week. She states that she has been drinking more than eating. Lack of appetite. She denies fever, chills, night sweats, blood in stools or abdominal pain. She states that she noticed the change in bowel movements when she increased her juice intake. Needs refills on medications. Reports compliance with medications. Does not report any side effects.    Observations/Objective: Patient with regular voice tone, rate and rhythm. Speaking calmly and is in no apparent distress.    Assessment and Plan:  Sickle cell-hemoglobin C disease without crisis (HCC) - oxyCODONE (ROXICODONE) 15 MG immediate release tablet; Take 1 tablet (15 mg total) by mouth every 6 (six) hours as needed for up to 15 days for pain.  Dispense: 60 tablet; Refill: 0 - baclofen (LIORESAL) 10 MG tablet; Take 1 tablet (10 mg total) by mouth 3 (three) times daily as needed for muscle spasms.  Dispense: 30 each; Refill: 1   Nausea - ondansetron (ZOFRAN) 8 MG tablet; Take 1 tablet (8 mg total) by mouth every 8 (eight) hours as needed for nausea or vomiting.  Dispense: 30 tablet; Refill: 1 - Lactobacillus Rhamnosus, GG, (CULTURELLE) CAPS; Take 1 capsule by mouth daily. Take 1 cap  po BID x 7 days and then decrease to once a day.  Dispense: 45 capsule; Refill: 0 . Non-seasonal allergic rhinitis due to pollen - levocetirizine (XYZAL) 5 MG tablet; TAKE 1 TABLET(5 MG) BY MOUTH EVERY EVENING  Dispense: 90 tablet; Refill: 1   Hypokalemia Patient with K+ 3.3 in march. Discussed eating potassium rich foods. Will come in tomorrow for labs. Will give supplement if needed. Advised that diarrhea can cause a decrease in potassium. Started on probiotics for this.  - Comprehensive metabolic panel; Future  Diarrhea Patient given probiotic for this. Discussed hydrating and adding an electrolyte supplement such as gatorade, coconut water, or pedialyte. If blood in stools occur, go to the ED immediately.   Follow Up Instructions:  We discussed hand washing, using hand sanitizer when soap and water are not available, only going out when absolutely necessary, and social distancing. Explained to patient that she is immunocompromised and will need to take precautions during this time.   I discussed the assessment and treatment plan with the patient. The patient was provided an opportunity to ask questions and all were answered. The patient agreed with the plan and demonstrated an understanding of the instructions.   The patient was advised to call back or seek an in-person evaluation if the symptoms worsen or if the condition fails to improve as anticipated.  I provided 15 minutes of non-face-to-face time during this encounter.  Ms. Andr L. Riley Lam, FNP-BC [facility] [address] [place], [place] [address] [phone]

## 2019-04-12 ENCOUNTER — Other Ambulatory Visit: Payer: Medicaid Other

## 2019-04-17 NOTE — Progress Notes (Signed)
New Patient Virtual Visit via Video Note The purpose of this virtual visit is to provide medical care while limiting exposure to the novel coronavirus.    Consent was obtained for video visit:  Yes.   Answered questions that patient had about telehealth interaction:  Yes.   I discussed the limitations, risks, security and privacy concerns of performing an evaluation and management service by telemedicine. I also discussed with the patient that there may be a patient responsible charge related to this service. The patient expressed understanding and agreed to proceed.  Pt location: Home Physician Location: office Name of referring provider:  Lanae Boast, FNP I connected with Hannah Hawkins at patients initiation/request on 04/19/2019 at  9:30 AM EDT by video enabled telemedicine application and verified that I am speaking with the correct person using two identifiers. Pt MRN:  [id number] Pt DOB:  15-Apr-1986 Video Participants:  Hannah Hawkins    History of Present Illness: Hannah Hawkins is a 33 y.o. right-handed African American female with sickle cell anemia presenting for evaluation of left hand paresthesias.   Starting in 2018, she began having spells of numbness and tingling involving the left fifth finger.  This occurs about 1-2 times per day and can last anywhere from a few minutes to a full day.  She has noticed that pressure around the elbow can trigger her symptoms and occasionally she has tingling sensation all the way from her medial forearm and into her elbow.  She also feels that her grip is weaker on the left, as compared to the right. she does not have any chronic neck pain.  She feels swelling over the left side of her neck and has noticed any soft tissue mass in her left posterior upper arm.  This mass is somewhat tender, does not cause any numbness or tingling.  She has shown this to her PCP who suggested she has a lipoma.  Out-side paper records, electronic medical  record, and images have been reviewed where available and summarized as:  Lab Results  Component Value Date   TSH 0.56 09/30/2017    Past Medical History:  Diagnosis Date  . Sickle cell anemia (HCC)     Past Surgical History:  Procedure Laterality Date  . EYE SURGERY  03/09/2017   left eye     Medications:  Outpatient Encounter Medications as of 04/19/2019  Medication Sig  . baclofen (LIORESAL) 10 MG tablet Take 1 tablet (10 mg total) by mouth 3 (three) times daily as needed for muscle spasms.  . ergocalciferol (VITAMIN D2) 1.25 MG (50000 UT) capsule Take 1 capsule (50,000 Units total) by mouth once a week.  . hydroxyurea (HYDREA) 500 MG capsule Take 2 capsules (1,000 mg total) by mouth daily.  Marland Kitchen ibuprofen (ADVIL,MOTRIN) 800 MG tablet Take 1 tablet (800 mg total) by mouth every 8 (eight) hours as needed for moderate pain.  . Lactobacillus Rhamnosus, GG, (CULTURELLE) CAPS Take 1 capsule by mouth daily. Take 1 cap po BID x 7 days and then decrease to once a day.  . levocetirizine (XYZAL) 5 MG tablet TAKE 1 TABLET(5 MG) BY MOUTH EVERY EVENING  . Multiple Vitamin (MULTIVITAMIN WITH MINERALS) TABS tablet Take 1 tablet by mouth daily.  Marland Kitchen NARCAN 4 MG/0.1ML LIQD nasal spray kit Place 4 mg into the nose once as needed. overdose  . ondansetron (ZOFRAN) 8 MG tablet Take 1 tablet (8 mg total) by mouth every 8 (eight) hours as needed for nausea or vomiting.  Marland Kitchen oxyCODONE (ROXICODONE)  15 MG immediate release tablet Take 1 tablet (15 mg total) by mouth every 6 (six) hours as needed for up to 15 days for pain.  . pantoprazole (PROTONIX) 40 MG tablet Take 1 tablet (40 mg total) by mouth daily.  . [DISCONTINUED] folic acid (FOLVITE) 1 MG tablet Take 1 tablet (1 mg total) by mouth daily. (Patient not taking: Reported on 02/28/2018)   No facility-administered encounter medications on file as of 04/19/2019.     Allergies:  Allergies  Allergen Reactions  . Penicillins Swelling and Rash    Has patient  had a PCN reaction causing immediate rash, facial/tongue/throat swelling, SOB or lightheadedness with hypotension: Yes Has patient had a PCN reaction causing severe rash involving mucus membranes or skin necrosis: Yes Has patient had a PCN reaction that required hospitalization No Has patient had a PCN reaction occurring within the last 10 years: no If all of the above answers are "NO", then may proceed with Cephalosporin use.     Social History: Social History   Tobacco Use  . Smoking status: Former Research scientist (life sciences)  . Smokeless tobacco: Never Used  Substance Use Topics  . Alcohol use: Yes    Comment: Twice a month  . Drug use: No   Social History   Social History Narrative  . Not on file    Review of Systems:  CONSTITUTIONAL: No fevers, chills, night sweats, or weight loss.   EYES: No visual changes or eye pain ENT: No hearing changes.  No history of nose bleeds.   RESPIRATORY: No cough, wheezing and shortness of breath.   CARDIOVASCULAR: Negative for chest pain, and palpitations.   GI: Negative for abdominal discomfort, blood in stools or black stools.  No recent change in bowel habits.   GU:  No history of incontinence.   MUSCLOSKELETAL: No history of joint pain or swelling.  No myalgias.   SKIN: Negative for lesions, rash, and itching.   HEMATOLOGY/ONCOLOGY: Negative for prolonged bleeding, bruising easily, and swollen nodes.  No history of cancer.   ENDOCRINE: Negative for cold or heat intolerance, polydipsia or goiter.   PSYCH:  No depression or anxiety symptoms.   NEURO: As Above.   Vital Signs:  Ht '5\' 3"'  (1.6 m)   Wt 140 lb (63.5 kg)   BMI 24.80 kg/m    General Medical Exam:  Well appearing, comfortable.  Nonlabored breathing.  No deformity or edema.  No rash.  Neurological Exam: MENTAL STATUS including orientation to time, place, person, recent and remote memory, attention span and concentration, language, and fund of knowledge is normal.  Speech is not dysarthric.   CRANIAL NERVES:  Normal conjugate, extra-ocular eye movements in all directions of gaze.  No ptosis.  Normal facial symmetry and movements.  Normal shoulder shrug and head rotation.  Tongue is midline.  MOTOR:  Antigravity in all extremities.  No abnormal movements.  No pronator drift.   COORDINATION/GAIT: Normal finger to nose bilaterally.  Intact rapid alternating movements bilaterally.  Gait narrow based and stable.   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  I will order NCS/EMG of the left hand and in the meantime have recommended strategies to avoid compression of the nerve at the elbow as well as limiting hyperflexion at the elbow to minimize stretching of the nerve.   Follow Up Instructions:  I discussed the assessment and treatment plan with the patient. The patient was provided an opportunity to ask questions and all were  answered. The patient agreed with the plan and demonstrated an understanding of the instructions.   The patient was advised to call back or seek an in-person evaluation if the symptoms worsen or if the condition fails to improve as anticipated.  Return to clinic after testing   Alda Berthold, DO

## 2019-04-17 NOTE — Telephone Encounter (Signed)
Message sent to provider 

## 2019-04-18 NOTE — Telephone Encounter (Signed)
Message sent to provider 

## 2019-04-19 ENCOUNTER — Encounter

## 2019-04-19 ENCOUNTER — Telehealth (INDEPENDENT_AMBULATORY_CARE_PROVIDER_SITE_OTHER): Payer: Medicaid Other | Admitting: Neurology

## 2019-04-19 ENCOUNTER — Encounter: Payer: Self-pay | Admitting: *Deleted

## 2019-04-19 ENCOUNTER — Encounter: Payer: Self-pay | Admitting: Neurology

## 2019-04-19 ENCOUNTER — Other Ambulatory Visit: Payer: Self-pay

## 2019-04-19 ENCOUNTER — Other Ambulatory Visit: Payer: Self-pay | Admitting: *Deleted

## 2019-04-19 VITALS — Ht 63.0 in | Wt 140.0 lb

## 2019-04-19 DIAGNOSIS — G5622 Lesion of ulnar nerve, left upper limb: Secondary | ICD-10-CM | POA: Diagnosis not present

## 2019-04-26 ENCOUNTER — Other Ambulatory Visit: Payer: Self-pay

## 2019-04-26 DIAGNOSIS — D5720 Sickle-cell/Hb-C disease without crisis: Secondary | ICD-10-CM

## 2019-04-26 NOTE — Telephone Encounter (Signed)
Message sent to provider 

## 2019-04-27 ENCOUNTER — Telehealth: Payer: Self-pay

## 2019-04-27 ENCOUNTER — Other Ambulatory Visit: Payer: Self-pay | Admitting: Family Medicine

## 2019-04-27 ENCOUNTER — Other Ambulatory Visit: Payer: Self-pay | Admitting: Internal Medicine

## 2019-04-27 DIAGNOSIS — D57219 Sickle-cell/Hb-C disease with crisis, unspecified: Secondary | ICD-10-CM

## 2019-04-27 DIAGNOSIS — D5720 Sickle-cell/Hb-C disease without crisis: Secondary | ICD-10-CM

## 2019-04-27 MED ORDER — OXYCODONE HCL 15 MG PO TABS: 15.0000 mg | ORAL_TABLET | Freq: Four times a day (QID) | ORAL | 0 refills | Status: DC | PRN

## 2019-04-27 NOTE — Telephone Encounter (Signed)
Refilled

## 2019-04-27 NOTE — Telephone Encounter (Signed)
Rx sent to pharmacy, not to be refilled prior to 04/28/2019. Patient is aware.

## 2019-04-27 NOTE — Telephone Encounter (Signed)
Refill request for oxycodone 5mg. Please advise.  

## 2019-05-07 ENCOUNTER — Other Ambulatory Visit: Payer: Self-pay | Admitting: Family Medicine

## 2019-05-07 DIAGNOSIS — D5720 Sickle-cell/Hb-C disease without crisis: Secondary | ICD-10-CM

## 2019-05-09 ENCOUNTER — Telehealth: Payer: Self-pay

## 2019-05-09 ENCOUNTER — Ambulatory Visit: Payer: Medicaid Other | Admitting: Family Medicine

## 2019-05-09 NOTE — Telephone Encounter (Signed)
Called and spoke with patient for COVID 19 Screening. Patient had no risk factors and is cleared to come into office for appointment. Thanks! 

## 2019-05-10 ENCOUNTER — Encounter: Payer: Self-pay | Admitting: Family Medicine

## 2019-05-10 ENCOUNTER — Ambulatory Visit (INDEPENDENT_AMBULATORY_CARE_PROVIDER_SITE_OTHER): Payer: Medicaid Other | Admitting: Family Medicine

## 2019-05-10 ENCOUNTER — Other Ambulatory Visit: Payer: Self-pay

## 2019-05-10 VITALS — BP 120/79 | HR 95 | Temp 98.2°F | Resp 18 | Ht 62.0 in | Wt 141.0 lb

## 2019-05-10 DIAGNOSIS — E559 Vitamin D deficiency, unspecified: Secondary | ICD-10-CM

## 2019-05-10 DIAGNOSIS — D5700 Hb-SS disease with crisis, unspecified: Secondary | ICD-10-CM

## 2019-05-10 DIAGNOSIS — K219 Gastro-esophageal reflux disease without esophagitis: Secondary | ICD-10-CM | POA: Diagnosis not present

## 2019-05-10 DIAGNOSIS — D5720 Sickle-cell/Hb-C disease without crisis: Secondary | ICD-10-CM | POA: Diagnosis not present

## 2019-05-10 DIAGNOSIS — K0889 Other specified disorders of teeth and supporting structures: Secondary | ICD-10-CM

## 2019-05-10 LAB — POCT URINALYSIS DIPSTICK
Bilirubin, UA: NEGATIVE
Glucose, UA: NEGATIVE
Ketones, UA: NEGATIVE
Leukocytes, UA: NEGATIVE
Nitrite, UA: NEGATIVE
Protein, UA: POSITIVE — AB
Spec Grav, UA: 1.02 (ref 1.010–1.025)
Urobilinogen, UA: 0.2 E.U./dL
pH, UA: 6 (ref 5.0–8.0)

## 2019-05-10 MED ORDER — OXYCODONE HCL 15 MG PO TABS: 15.0000 mg | ORAL_TABLET | Freq: Four times a day (QID) | ORAL | 0 refills | Status: DC | PRN

## 2019-05-10 MED ORDER — PANTOPRAZOLE SODIUM 40 MG PO TBEC: 40.0000 mg | DELAYED_RELEASE_TABLET | Freq: Every day | ORAL | 3 refills | Status: DC

## 2019-05-10 MED ORDER — OXYCODONE HCL ER 10 MG PO T12A: 10.0000 mg | EXTENDED_RELEASE_TABLET | Freq: Two times a day (BID) | ORAL | 0 refills | Status: DC

## 2019-05-10 MED ORDER — ERGOCALCIFEROL 1.25 MG (50000 UT) PO CAPS: 50000.0000 [IU] | ORAL_CAPSULE | ORAL | 3 refills | Status: DC

## 2019-05-10 MED ORDER — CHLORHEXIDINE GLUCONATE 0.12 % MT SOLN: 10.0000 mL | Freq: Two times a day (BID) | OROMUCOSAL | 0 refills | Status: AC

## 2019-05-10 NOTE — Progress Notes (Signed)
[facility] INTERNAL MEDICINE AND SICKLE CELL CARE  SICKLE CELL ANEMIA FOLLOW UP VISIT PROVIDER: Mike Gip, FNP    Subjective:   Hannah Hawkins  is a 33 y.o.  female who  has a past medical history of Sickle cell anemia (HCC).  Patient presents for follow-up of sickle cell anemia.  She reports compliance with medications and denies side effects at the present time.  She states that her home pain medication regimen is effective.  She has had 3 admissions/visits in the past 6 months. She reports adequate hydration.  She denies chest pain shortness of breath dizziness or leg swelling.  Review of Systems  Constitutional: Negative.   HENT: Negative.   Eyes: Negative.   Respiratory: Negative.   Cardiovascular: Negative.   Gastrointestinal: Negative.   Genitourinary: Negative.   Musculoskeletal: Negative.   Skin: Negative.   Neurological: Negative.   Psychiatric/Behavioral: Negative.     Objective:   Objective  BP 120/79 (BP Location: Left Arm, Patient Position: Sitting, Cuff Size: Normal)   Pulse 95   Temp 98.2 F (36.8 C) (Oral)   Resp 18   Ht 5\' 2"  (1.575 m)   Wt 141 lb (64 kg)   LMP 04/21/2019   SpO2 99%   BMI 25.79 kg/m   Wt Readings from Last 3 Encounters:  05/10/19 141 lb (64 kg)  04/19/19 140 lb (63.5 kg)  03/01/19 136 lb 12.8 oz (62.1 kg)     Physical Exam Vitals signs and nursing note reviewed.  Constitutional:      General: She is not in acute distress.    Appearance: She is well-developed.     Comments: Patient appears to be intoxicated as evidenced by red eyes, difficulty with concentrating, and slurring speech  HENT:     Head: Normocephalic and atraumatic.  Eyes:     Conjunctiva/sclera: Conjunctivae normal.     Pupils: Pupils are equal, round, and reactive to light.  Neck:     Musculoskeletal: Normal range of motion.  Cardiovascular:     Rate and Rhythm: Normal rate and regular rhythm.     Heart sounds: Normal heart sounds.   Pulmonary:     Effort: Pulmonary effort is normal. No respiratory distress.     Breath sounds: Normal breath sounds.  Musculoskeletal: Normal range of motion.  Skin:    General: Skin is warm and dry.  Neurological:     Mental Status: She is alert and oriented to person, place, and time.  Psychiatric:        Attention and Perception: Perception normal. She is inattentive.        Mood and Affect: Mood and affect normal.        Speech: Speech is slurred.        Behavior: Behavior is cooperative.        Thought Content: Thought content normal.        Cognition and Memory: Cognition normal.        Judgment: Judgment normal.      Assessment/Plan:   Assessment   Encounter Diagnoses  Name Primary?  . Sickle cell pain crisis (HCC)   . Gastroesophageal reflux disease without esophagitis   . Vitamin D deficiency   . 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  Dispense: 30 tablet; Refill: 3  Vitamin D deficiency - ergocalciferol (  VITAMIN D2) 1.25 MG (50000 UT) capsule; Take 1 capsule (50,000 Units total) by mouth once a week.  Dispense: 12 capsule; Refill: 3   Sickle cell-hemoglobin C disease without crisis (HCC) Patient provided urine sample that was not at an appropriate body temperature. Due to this, drug screening performed today through blood. Started patient on long acting oxycodone 10 for use during crises in conjunction with  - Urinalysis Dipstick - Drugs of Abuse Scr ONLY, 10,WB - Comprehensive metabolic panel - CBC with Differential - oxyCODONE (OXYCONTIN) 10 mg 12 hr tablet; Take 1 tablet (10 mg total) by mouth every 12 (twelve) hours for 15 days.  Dispense: 30 tablet; Refill: 0 - oxyCODONE (ROXICODONE) 15 MG immediate release tablet; Take 1 tablet (15 mg total) by mouth every 6 (six) hours as needed for up to 15  days for pain.  Dispense: 60 tablet; Refill: 0   Pain, dental - chlorhexidine (PERIDEX) 0.12 % solution; Use as directed 10 mLs in the mouth or throat 2 (two) times daily for 10 days.  Dispense: 473 mL; Refill: 0 Return to care as scheduled and prn. Patient verbalized understanding and agreed with plan of care.  Patient called the office after face to face visit and requested medications for dental pain and gingival swelling.   1. Sickle cell disease - We discussed the need for good hydration, monitoring of hydration status, avoidance of heat, cold, stress, and infection triggers. We discussed the risks and benefits of Hydrea, including bone marrow suppression, the possibility of GI upset, skin ulcers, hair thinning, and teratogenicity. The patient was reminded of the need to seek medical attention of any symptoms of bleeding, anemia, or infection. Continue folic acid 1 mg daily to prevent aplastic bone marrow crises.   2. Pulmonary evaluation - Patient denies severe recurrent wheezes, shortness of breath with exercise, or persistent cough. If these symptoms develop, pulmonary function tests with spirometry will be ordered, and if abnormal, plan on referral to Pulmonology for further evaluation.  3. Cardiac - Routine screening for pulmonary hypertension is not recommended.  4. Eye - High risk of proliferative retinopathy. Annual eye exam with retinal exam recommended to patient.  5. Immunization status -  Yearly influenza vaccination is recommended, as well as being up to date with Meningococcal and Pneumococcal vaccines.   6. Acute and chronic painful episodes - We discussed that pt is to receive Schedule II prescriptions only from [place]. Pt is also aware that the prescription history is available to [place] online through the [facility] CSRS. Controlled substance agreement signed. We reminded Hannah Hawkins that all patients receiving Schedule II narcotics must be seen for follow within one month of prescription  being requested. We reviewed the terms of our pain agreement, including the need to keep medicines in a safe locked location away from children or pets, and the need to report excess sedation or constipation, measures to avoid constipation, and policies related to early refills and stolen prescriptions. According to the [place] [facility] program, we have reviewed details related to analgesia, adverse effects, aberrant behaviors.  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  Not applicable at this time.  If this occurs will use Exjade for management.   8. Vitamin D deficiency - Drisdol 50,000 units weekly. Patient encouraged to take as prescribed.   The above recommendations are taken from the NIH Evidence-Based Management of Sickle Cell Disease: Expert Panel Report, 16109.   Ms. Andr L. Riley Lam, FNP-BC [facility]  [address]  [place], [place] [id number] [phone]  This note has been created with Dragon speech recognition software and smart phrase technology. Any transcriptional errors are unintentional.

## 2019-05-11 ENCOUNTER — Ambulatory Visit: Payer: Medicaid Other | Admitting: Neurology

## 2019-05-11 ENCOUNTER — Telehealth: Payer: Self-pay | Admitting: Family Medicine

## 2019-05-11 NOTE — Telephone Encounter (Signed)
Sent to nurse

## 2019-05-11 NOTE — Telephone Encounter (Signed)
Called and spoke with patient, advised that I have entered in prior authorization on  Tracks. I have also contacted Marguerite via email with the sickle cell agency and the Sickle cell data manager (justin [employer]) via phone message that patient is having trouble with this and that prior authorization has been place online. Thanks!

## 2019-05-13 LAB — COMPREHENSIVE METABOLIC PANEL
ALT: 11 IU/L (ref 0–32)
AST: 18 IU/L (ref 0–40)
Albumin/Globulin Ratio: 1.7 (ref 1.2–2.2)
Albumin: 4 g/dL (ref 3.8–4.8)
Alkaline Phosphatase: 50 IU/L (ref 39–117)
BUN/Creatinine Ratio: 4 — ABNORMAL LOW (ref 9–23)
BUN: 3 mg/dL — ABNORMAL LOW (ref 6–20)
Bilirubin Total: 0.5 mg/dL (ref 0.0–1.2)
CO2: 26 mmol/L (ref 20–29)
Calcium: 8.8 mg/dL (ref 8.7–10.2)
Chloride: 102 mmol/L (ref 96–106)
Creatinine, Ser: 0.76 mg/dL (ref 0.57–1.00)
GFR calc Af Amer: 118 mL/min/{1.73_m2} (ref >59)
GFR calc non Af Amer: 103 mL/min/{1.73_m2} (ref >59)
Globulin, Total: 2.4 g/dL (ref 1.5–4.5)
Glucose: 85 mg/dL (ref 65–99)
Potassium: 3.7 mmol/L (ref 3.5–5.2)
Sodium: 139 mmol/L (ref 134–144)
Total Protein: 6.4 g/dL (ref 6.0–8.5)

## 2019-05-13 LAB — CBC WITH DIFFERENTIAL/PLATELET
Basophils Absolute: 0.1 10*3/uL (ref 0.0–0.2)
Basos: 1 %
EOS (ABSOLUTE): 0.2 10*3/uL (ref 0.0–0.4)
Eos: 4 %
Hematocrit: 32.1 % — ABNORMAL LOW (ref 34.0–46.6)
Hemoglobin: 11.2 g/dL (ref 11.1–15.9)
Immature Grans (Abs): 0 10*3/uL (ref 0.0–0.1)
Immature Granulocytes: 0 %
Lymphocytes Absolute: 2.4 10*3/uL (ref 0.7–3.1)
Lymphs: 45 %
MCH: 29.3 pg (ref 26.6–33.0)
MCHC: 34.9 g/dL (ref 31.5–35.7)
MCV: 84 fL (ref 79–97)
Monocytes Absolute: 0.5 10*3/uL (ref 0.1–0.9)
Monocytes: 10 %
NRBC: 2 % — ABNORMAL HIGH (ref 0–0)
Neutrophils Absolute: 2.1 10*3/uL (ref 1.4–7.0)
Neutrophils: 40 %
Platelets: 360 10*3/uL (ref 150–450)
RBC: 3.82 x10E6/uL (ref 3.77–5.28)
RDW: 15.2 % (ref 11.7–15.4)
WBC: 5.3 10*3/uL (ref 3.4–10.8)

## 2019-05-13 LAB — DRUGS OF ABUSE SCR ONLY, 10,WB
Amphetamines: NEGATIVE
Barbiturates: NEGATIVE
Benzodiazepines: NEGATIVE
Cocaine Metabolite: POSITIVE
Methadone: NEGATIVE
Opiates: NEGATIVE
Oxycodone: POSITIVE
Phencyclidine: NEGATIVE
Propoxyphene: NEGATIVE
THC (Marijuana) Metabolite: NEGATIVE

## 2019-05-15 ENCOUNTER — Other Ambulatory Visit: Payer: Self-pay

## 2019-05-15 ENCOUNTER — Emergency Department (HOSPITAL_COMMUNITY): Payer: Medicaid Other

## 2019-05-15 ENCOUNTER — Inpatient Hospital Stay (HOSPITAL_COMMUNITY)
Admission: EM | Admit: 2019-05-15 | Discharge: 2019-05-18 | DRG: 812 | Disposition: A | Discharge status: Home / Self Care | Payer: Medicaid Other | Attending: Internal Medicine | Admitting: Internal Medicine

## 2019-05-15 ENCOUNTER — Telehealth (HOSPITAL_COMMUNITY): Payer: Self-pay | Admitting: General Practice

## 2019-05-15 ENCOUNTER — Encounter (HOSPITAL_COMMUNITY): Payer: Self-pay

## 2019-05-15 DIAGNOSIS — Z20828 Contact with and (suspected) exposure to other viral communicable diseases: Secondary | ICD-10-CM | POA: Diagnosis present

## 2019-05-15 DIAGNOSIS — F1491 Cocaine use, unspecified, in remission: Secondary | ICD-10-CM

## 2019-05-15 DIAGNOSIS — G894 Chronic pain syndrome: Secondary | ICD-10-CM | POA: Diagnosis present

## 2019-05-15 DIAGNOSIS — Z87891 Personal history of nicotine dependence: Secondary | ICD-10-CM

## 2019-05-15 DIAGNOSIS — Z880 Allergy status to penicillin: Secondary | ICD-10-CM

## 2019-05-15 DIAGNOSIS — Z833 Family history of diabetes mellitus: Secondary | ICD-10-CM

## 2019-05-15 DIAGNOSIS — G43909 Migraine, unspecified, not intractable, without status migrainosus: Secondary | ICD-10-CM | POA: Diagnosis present

## 2019-05-15 DIAGNOSIS — D5700 Hb-SS disease with crisis, unspecified: Secondary | ICD-10-CM | POA: Diagnosis not present

## 2019-05-15 DIAGNOSIS — D638 Anemia in other chronic diseases classified elsewhere: Secondary | ICD-10-CM | POA: Diagnosis present

## 2019-05-15 DIAGNOSIS — Z87898 Personal history of other specified conditions: Secondary | ICD-10-CM

## 2019-05-15 LAB — CBC WITH DIFFERENTIAL/PLATELET
Abs Immature Granulocytes: 0.01 10*3/uL (ref 0.00–0.07)
Basophils Absolute: 0.1 10*3/uL (ref 0.0–0.1)
Basophils Relative: 1 %
Eosinophils Absolute: 0.6 10*3/uL — ABNORMAL HIGH (ref 0.0–0.5)
Eosinophils Relative: 10 %
HCT: 29.4 % — ABNORMAL LOW (ref 36.0–46.0)
Hemoglobin: 10.8 g/dL — ABNORMAL LOW (ref 12.0–15.0)
Immature Granulocytes: 0 %
Lymphocytes Relative: 34 %
Lymphs Abs: 2.1 10*3/uL (ref 0.7–4.0)
MCH: 29 pg (ref 26.0–34.0)
MCHC: 36.7 g/dL — ABNORMAL HIGH (ref 30.0–36.0)
MCV: 79 fL — ABNORMAL LOW (ref 80.0–100.0)
Monocytes Absolute: 0.7 10*3/uL (ref 0.1–1.0)
Monocytes Relative: 11 %
Neutro Abs: 2.7 10*3/uL (ref 1.7–7.7)
Neutrophils Relative %: 44 %
Platelets: 346 10*3/uL (ref 150–400)
RBC: 3.72 MIL/uL — ABNORMAL LOW (ref 3.87–5.11)
RDW: 14 % (ref 11.5–15.5)
WBC: 6.2 10*3/uL (ref 4.0–10.5)
nRBC: 6.3 % — ABNORMAL HIGH (ref 0.0–0.2)

## 2019-05-15 LAB — RETICULOCYTES
Immature Retic Fract: 40.3 % — ABNORMAL HIGH (ref 2.3–15.9)
RBC.: 3.72 MIL/uL — ABNORMAL LOW (ref 3.87–5.11)
Retic Count, Absolute: 162.2 10*3/uL (ref 19.0–186.0)
Retic Ct Pct: 4.4 % — ABNORMAL HIGH (ref 0.4–3.1)

## 2019-05-15 LAB — COMPREHENSIVE METABOLIC PANEL
ALT: 14 U/L (ref 0–44)
AST: 20 U/L (ref 15–41)
Albumin: 3.6 g/dL (ref 3.5–5.0)
Alkaline Phosphatase: 44 U/L (ref 38–126)
Anion gap: 7 (ref 5–15)
BUN: 10 mg/dL (ref 6–20)
CO2: 26 mmol/L (ref 22–32)
Calcium: 8.5 mg/dL — ABNORMAL LOW (ref 8.9–10.3)
Chloride: 105 mmol/L (ref 98–111)
Creatinine, Ser: 0.75 mg/dL (ref 0.44–1.00)
GFR calc Af Amer: >60 mL/min (ref >60)
GFR calc non Af Amer: >60 mL/min (ref >60)
Glucose, Bld: 95 mg/dL (ref 70–99)
Potassium: 4 mmol/L (ref 3.5–5.1)
Sodium: 138 mmol/L (ref 135–145)
Total Bilirubin: 0.6 mg/dL (ref 0.3–1.2)
Total Protein: 6.6 g/dL (ref 6.5–8.1)

## 2019-05-15 LAB — URINALYSIS, ROUTINE W REFLEX MICROSCOPIC
Bilirubin Urine: NEGATIVE
Glucose, UA: NEGATIVE mg/dL
Hgb urine dipstick: NEGATIVE
Ketones, ur: NEGATIVE mg/dL
Leukocytes,Ua: NEGATIVE
Nitrite: NEGATIVE
Protein, ur: NEGATIVE mg/dL
Specific Gravity, Urine: 1.008 (ref 1.005–1.030)
pH: 8 (ref 5.0–8.0)

## 2019-05-15 LAB — SARS CORONAVIRUS 2 BY RT PCR (HOSPITAL ORDER, PERFORMED IN ~~LOC~~ HOSPITAL LAB): SARS Coronavirus 2: NEGATIVE

## 2019-05-15 LAB — I-STAT BETA HCG BLOOD, ED (MC, WL, AP ONLY): I-stat hCG, quantitative: <5 m[IU]/mL (ref <5)

## 2019-05-15 MED ORDER — DIPHENHYDRAMINE HCL 50 MG/ML IJ SOLN
25.0000 mg | Freq: Once | INTRAMUSCULAR | Status: AC
  Administered 2019-05-15: 25 mg via INTRAVENOUS
  Filled 2019-05-15: qty 1

## 2019-05-15 MED ORDER — ENOXAPARIN SODIUM 40 MG/0.4ML ~~LOC~~ SOLN
40.0000 mg | SUBCUTANEOUS | Status: DC
  Administered 2019-05-15 – 2019-05-17 (×2): 40 mg via SUBCUTANEOUS
  Filled 2019-05-15 (×3): qty 0.4

## 2019-05-15 MED ORDER — HYDROMORPHONE HCL 2 MG/ML IJ SOLN
2.0000 mg | INTRAMUSCULAR | Status: AC
  Administered 2019-05-15: 15:00:00 2 mg via INTRAVENOUS
  Filled 2019-05-15: qty 1

## 2019-05-15 MED ORDER — HYDROMORPHONE 1 MG/ML IV SOLN
INTRAVENOUS | Status: DC
  Administered 2019-05-15: 30 mg via INTRAVENOUS
  Administered 2019-05-16: 9.5 mg via INTRAVENOUS
  Filled 2019-05-15 (×2): qty 30

## 2019-05-15 MED ORDER — DIPHENHYDRAMINE HCL 25 MG PO CAPS
25.0000 mg | ORAL_CAPSULE | ORAL | Status: DC | PRN
  Administered 2019-05-16: 25 mg via ORAL
  Filled 2019-05-15: qty 1

## 2019-05-15 MED ORDER — SENNOSIDES-DOCUSATE SODIUM 8.6-50 MG PO TABS
1.0000 | ORAL_TABLET | Freq: Two times a day (BID) | ORAL | Status: DC
  Administered 2019-05-15 – 2019-05-18 (×6): 1 via ORAL
  Filled 2019-05-15 (×6): qty 1

## 2019-05-15 MED ORDER — SODIUM CHLORIDE 0.9% FLUSH: 9.0000 mL | INTRAVENOUS | Status: DC | PRN

## 2019-05-15 MED ORDER — HYDROMORPHONE HCL 2 MG/ML IJ SOLN: 2.0000 mg | INTRAMUSCULAR | Status: AC

## 2019-05-15 MED ORDER — DEXTROSE-NACL 5-0.45 % IV SOLN: INTRAVENOUS | Status: DC

## 2019-05-15 MED ORDER — POLYETHYLENE GLYCOL 3350 17 G PO PACK: 17.0000 g | PACK | Freq: Every day | ORAL | Status: DC | PRN

## 2019-05-15 MED ORDER — SODIUM CHLORIDE 0.9 % IV SOLN
25.0000 mg | INTRAVENOUS | Status: DC | PRN
  Filled 2019-05-15: qty 0.5

## 2019-05-15 MED ORDER — KETOROLAC TROMETHAMINE 30 MG/ML IJ SOLN
30.0000 mg | INTRAMUSCULAR | Status: AC
  Administered 2019-05-15: 30 mg via INTRAVENOUS
  Filled 2019-05-15: qty 1

## 2019-05-15 MED ORDER — SODIUM CHLORIDE 0.45 % IV SOLN
INTRAVENOUS | Status: DC
  Administered 2019-05-15 – 2019-05-16 (×2): via INTRAVENOUS

## 2019-05-15 MED ORDER — KETOROLAC TROMETHAMINE 15 MG/ML IJ SOLN
15.0000 mg | Freq: Four times a day (QID) | INTRAMUSCULAR | Status: DC
  Administered 2019-05-15 – 2019-05-16 (×3): 15 mg via INTRAVENOUS
  Filled 2019-05-15 (×3): qty 1

## 2019-05-15 MED ORDER — HYDROMORPHONE HCL 2 MG/ML IJ SOLN
2.0000 mg | INTRAMUSCULAR | Status: AC
  Administered 2019-05-15: 2 mg via INTRAVENOUS
  Filled 2019-05-15: qty 1

## 2019-05-15 MED ORDER — NALOXONE HCL 0.4 MG/ML IJ SOLN: 0.4000 mg | INTRAMUSCULAR | Status: DC | PRN

## 2019-05-15 MED ORDER — SODIUM CHLORIDE 0.9% FLUSH
3.0000 mL | Freq: Once | INTRAVENOUS | Status: AC
  Administered 2019-05-15: 3 mL via INTRAVENOUS

## 2019-05-15 MED ORDER — HYDROMORPHONE HCL 2 MG/ML IJ SOLN
2.0000 mg | INTRAMUSCULAR | Status: AC
  Administered 2019-05-15: 18:00:00 2 mg via INTRAVENOUS
  Filled 2019-05-15: qty 1

## 2019-05-15 MED ORDER — ONDANSETRON HCL 4 MG/2ML IJ SOLN
4.0000 mg | INTRAMUSCULAR | Status: DC | PRN
  Administered 2019-05-15 – 2019-05-16 (×3): 4 mg via INTRAVENOUS
  Filled 2019-05-15 (×3): qty 2

## 2019-05-15 NOTE — ED Provider Notes (Signed)
[facility] DEPT Provider Note   CSN: [id number] Arrival date & time: 05/15/19  1137    History   Chief Complaint Chief Complaint  Patient presents with  . Sickle Cell Pain Crisis  . Migraine    HPI Hannah Hawkins is a 33 y.o. female.     Pt presents to the ED today with a sickle cell anemia.  The pt said she's also had swelling in her with her hands and her feet.  The pt said she also has some pain and swelling in her left shoulder.  The pt said she's never had the swelling in the past.  She denies sob or fever.  No cough.       Past Medical History:  Diagnosis Date  . Sickle cell anemia [facility])     Patient Active Problem List   Diagnosis Date Noted  . Cocaine use   . Sickle cell pain crisis ([place]) 11/14/2017  . Anemia of chronic disease 11/13/2017  . Sinusitis, chronic 11/13/2017  . Sickle-cell/Hb-C disease ([place]) 02/18/2017    Past Surgical History:  Procedure Laterality Date  . EYE SURGERY  03/09/2017   left eye     OB History   No obstetric history on file.      Home Medications    Prior to Admission medications   Medication Sig Start Date End Date Taking? Authorizing Provider  baclofen (LIORESAL) 10 MG tablet TAKE 1 TABLET(10 MG) BY MOUTH THREE TIMES DAILY AS NEEDED FOR MUSCLE SPASMS Patient taking differently: Take 10 mg by mouth 3 (three) times daily as needed for muscle spasms.  05/07/19  Yes Lanae Boast, FNP  chlorhexidine (PERIDEX) 0.12 % solution Use as directed 10 mLs in the mouth or throat 2 (two) times daily for 10 days. 05/10/19 05/20/19 Yes Lanae Boast, FNP  ergocalciferol (VITAMIN D2) 1.25 MG (50000 UT) capsule Take 1 capsule (50,000 Units total) by mouth once a week. 05/10/19  Yes Lanae Boast, FNP  hydroxyurea (HYDREA) 500 MG capsule TAKE 2 CAPSULES(1000 MG) BY MOUTH DAILY Patient taking differently: Take 1,000 mg by mouth daily.  05/07/19  Yes Lanae Boast, FNP  ibuprofen (ADVIL) 800 MG tablet TAKE 1  TABLET(800 MG) BY MOUTH EVERY 8 HOURS AS NEEDED FOR MODERATE PAIN Patient taking differently: Take 800 mg by mouth every 8 (eight) hours as needed for moderate pain.  04/27/19  Yes Lanae Boast, FNP  Lactobacillus Rhamnosus, GG, (CULTURELLE) CAPS Take 1 capsule by mouth daily. Take 1 cap po BID x 7 days and then decrease to once a day. Patient taking differently: Take 1 capsule by mouth See admin instructions. Take 1 cap oral twice daily for 7 days and then decrease to once daily 04/11/19  Yes Lanae Boast, FNP  levocetirizine (XYZAL) 5 MG tablet TAKE 1 TABLET(5 MG) BY MOUTH EVERY EVENING Patient taking differently: Take 5 mg by mouth every evening.  04/11/19  Yes Lanae Boast, FNP  Multiple Vitamin (MULTIVITAMIN WITH MINERALS) TABS tablet Take 1 tablet by mouth daily.   Yes [provider]  NARCAN 4 MG/0.1ML LIQD nasal spray kit Place 4 mg into the nose once as needed (overdose).  12/22/17  Yes [provider]  ondansetron (ZOFRAN) 8 MG tablet Take 1 tablet (8 mg total) by mouth every 8 (eight) hours as needed for nausea or vomiting. 04/11/19  Yes Lanae Boast, FNP  oxyCODONE (OXYCONTIN) 10 mg 12 hr tablet Take 1 tablet (10 mg total) by mouth every 12 (twelve) hours for 15 days.  05/11/19 05/26/19 Yes Lanae Boast, FNP  oxyCODONE (ROXICODONE) 15 MG immediate release tablet Take 1 tablet (15 mg total) by mouth every 6 (six) hours as needed for up to 15 days for pain. 05/11/19 05/26/19 Yes Lanae Boast, FNP  pantoprazole (PROTONIX) 40 MG tablet Take 1 tablet (40 mg total) by mouth daily. 05/10/19  Yes Lanae Boast, FNP    Family History Family History  Problem Relation Age of Onset  . Diabetes Father     Social History Social History   Tobacco Use  . Smoking status: Former Smoker    Types: Cigarettes  . Smokeless tobacco: Never Used  Substance Use Topics  . Alcohol use: Not Currently  . Drug use: No     Allergies   Penicillins   Review of Systems Review of  Systems  Musculoskeletal:       Swelling and pain to both feet and hands and left shoulder  All other systems reviewed and are negative.    Physical Exam Updated Vital Signs BP 114/83   Pulse 74   Temp 98.5 F (36.9 C) (Oral)   Resp 16   Ht _0  (1.6 m)   Wt 63.5 kg   LMP 04/21/2019   SpO2 100%   BMI 24.80 kg/m   Physical Exam Vitals signs and nursing note reviewed.  Constitutional:      Appearance: Normal appearance.  HENT:     Head: Normocephalic and atraumatic.     Right Ear: External ear normal.     Left Ear: External ear normal.     Nose: Nose normal.     Mouth/Throat:     Mouth: Mucous membranes are moist.     Pharynx: Oropharynx is clear.  Eyes:     Extraocular Movements: Extraocular movements intact.     Conjunctiva/sclera: Conjunctivae normal.     Pupils: Pupils are equal, round, and reactive to light.  Neck:     Musculoskeletal: Normal range of motion and neck supple.  Cardiovascular:     Rate and Rhythm: Normal rate and regular rhythm.     Pulses: Normal pulses.     Heart sounds: Normal heart sounds.  Pulmonary:     Effort: Pulmonary effort is normal.     Breath sounds: Normal breath sounds.  Abdominal:     General: Abdomen is flat. Bowel sounds are normal.     Palpations: Abdomen is soft.  Musculoskeletal:     Comments: Left shoulder with decreased rom.  Both hands and legs with dactylitis.  Skin:    General: Skin is warm.  Neurological:     Mental Status: She is alert.      ED Treatments / Results  Labs (all labs ordered are listed, but only abnormal results are displayed) Labs Reviewed  COMPREHENSIVE METABOLIC PANEL - Abnormal; Notable for the following components:      Result Value   Calcium 8.5 (*)    All other components within normal limits  CBC WITH DIFFERENTIAL/PLATELET - Abnormal; Notable for the following components:   RBC 3.72 (*)    Hemoglobin 10.8 (*)    HCT 29.4 (*)    MCV 79.0 (*)    MCHC 36.7 (*)    nRBC 6.3 (*)     Eosinophils Absolute 0.6 (*)    All other components within normal limits  RETICULOCYTES - Abnormal; Notable for the following components:   Retic Ct Pct 4.4 (*)    RBC. 3.72 (*)    Immature Retic Fract 40.3 (*)  All other components within normal limits  URINALYSIS, ROUTINE W REFLEX MICROSCOPIC  I-STAT BETA HCG BLOOD, ED ([facility], [facility], [facility] ONLY)    EKG None  Radiology No results found.  Procedures Procedures (including critical care time)  Medications Ordered in ED Medications  0.45 % sodium chloride infusion ( Intravenous New Bag/Given 05/15/19 1258)  HYDROmorphone (DILAUDID) injection 2 mg (has no administration in time range)    Or  HYDROmorphone (DILAUDID) injection 2 mg (has no administration in time range)  ondansetron (ZOFRAN) injection 4 mg (has no administration in time range)  sodium chloride flush (NS) 0.9 % injection 3 mL (3 mLs Intravenous Given 05/15/19 1300)  ketorolac (TORADOL) 30 MG/ML injection 30 mg (30 mg Intravenous Given 05/15/19 1247)  HYDROmorphone (DILAUDID) injection 2 mg (2 mg Intravenous Given 05/15/19 1252)    Or  HYDROmorphone (DILAUDID) injection 2 mg ( Subcutaneous See Alternative 05/15/19 1252)  HYDROmorphone (DILAUDID) injection 2 mg (2 mg Intravenous Given 05/15/19 1423)    Or  HYDROmorphone (DILAUDID) injection 2 mg ( Subcutaneous See Alternative 05/15/19 1423)  HYDROmorphone (DILAUDID) injection 2 mg (2 mg Intravenous Given 05/15/19 1504)    Or  HYDROmorphone (DILAUDID) injection 2 mg ( Subcutaneous See Alternative 05/15/19 1504)  diphenhydrAMINE (BENADRYL) injection 25 mg (25 mg Intravenous Given 05/15/19 1247)     Initial Impression / Assessment and Plan / ED Course  I have reviewed the triage vital signs and the nursing notes.  Pertinent labs & imaging results that were available during my care of the patient were reviewed by me and considered in my medical decision making (see chart for details).       Pt's pain is still there, but she's  just been given her 2nd dose of meds.  Pt to signed out to Dr. Tyrone Nine.  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

## 2019-05-15 NOTE — H&P (Signed)
TRH H&P    Patient Demographics:    Hannah Hawkins, is a 33 y.o. female  MRN: [id number]  DOB - [birth year]  Admit Date - 05/15/2019  Referring MD/NP/PA:  Deno Etienne  Outpatient Primary MD for the patient is Lanae Boast, [place]  Patient coming from:   home  Chief complaint- sickle cell crisis   HPI:    Julisa Flippo  is a 33 y.o. female,w sickle cell anemia, apparently presents with pain in lower ext bilaterally as well as pain in bilateral shoulder and arms which pt states is like her typical sickle cell crisis.  Pt denies fever, chills, cough, cp, palp, n/v, diarrhea, brbrp dysuria, hematuria.   In ED,  Na 138, K 4.0 Bun 10, Creatinine 0.75 Ast 20, Alt 14  Wbc 6.2, Hgb 10.8, Plt 346  SARS negative   Left shoulder xray Impression: negative  Pt given dilaudid iv x4, zofran 27m iv x1,  Pain not relieved.   Pt will require admission for sickle cell crisis for iv fluids, iv pain medication.        Review of systems:    In addition to the HPI above,  No Fever-chills, No Headache, No changes with Vision or hearing, No problems swallowing food or Liquids, No Chest pain, Cough or Shortness of Breath, No Abdominal pain, No Nausea or Vomiting, bowel movements are regular, No Blood in stool or Urine, No dysuria, No new skin rashes or bruises,   No new weakness, tingling, numbness in any extremity, No recent weight gain or loss, No polyuria, polydypsia or polyphagia, No significant Mental Stressors.  All other systems reviewed and are negative.    Past History of the following :    Past Medical History:  Diagnosis Date  . Sickle cell anemia (HCC)       Past Surgical History:  Procedure Laterality Date  . EYE SURGERY  03/09/2017   left eye      Social History:      Social History   Tobacco Use  . Smoking status: Former Smoker    Types: Cigarettes  . Smokeless  tobacco: Never Used  Substance Use Topics  . Alcohol use: Not Currently       Family History :     Family History  Problem Relation Age of Onset  . Diabetes Father        Home Medications:   Prior to Admission medications   Medication Sig Start Date End Date Taking? Authorizing Provider  baclofen (LIORESAL) 10 MG tablet TAKE 1 TABLET(10 MG) BY MOUTH THREE TIMES DAILY AS NEEDED FOR MUSCLE SPASMS Patient taking differently: Take 10 mg by mouth 3 (three) times daily as needed for muscle spasms.  05/07/19  Yes DLanae Boast FNP  chlorhexidine (PERIDEX) 0.12 % solution Use as directed 10 mLs in the mouth or throat 2 (two) times daily for 10 days. 05/10/19 05/20/19 Yes DLanae Boast FNP  ergocalciferol (VITAMIN D2) 1.25 MG (50000 UT) capsule Take 1 capsule (50,000 Units total) by mouth once a week. 05/10/19  Yes DNathaneil Canary  Venora Maples, FNP  hydroxyurea (HYDREA) 500 MG capsule TAKE 2 CAPSULES(1000 MG) BY MOUTH DAILY Patient taking differently: Take 1,000 mg by mouth daily.  05/07/19  Yes Lanae Boast, FNP  ibuprofen (ADVIL) 800 MG tablet TAKE 1 TABLET(800 MG) BY MOUTH EVERY 8 HOURS AS NEEDED FOR MODERATE PAIN Patient taking differently: Take 800 mg by mouth every 8 (eight) hours as needed for moderate pain.  04/27/19  Yes Lanae Boast, FNP  Lactobacillus Rhamnosus, GG, (CULTURELLE) CAPS Take 1 capsule by mouth daily. Take 1 cap po BID x 7 days and then decrease to once a day. Patient taking differently: Take 1 capsule by mouth See admin instructions. Take 1 cap oral twice daily for 7 days and then decrease to once daily 04/11/19  Yes Lanae Boast, FNP  levocetirizine (XYZAL) 5 MG tablet TAKE 1 TABLET(5 MG) BY MOUTH EVERY EVENING Patient taking differently: Take 5 mg by mouth every evening.  04/11/19  Yes Lanae Boast, FNP  Multiple Vitamin (MULTIVITAMIN WITH MINERALS) TABS tablet Take 1 tablet by mouth daily.   Yes [provider]  NARCAN 4 MG/0.1ML LIQD nasal spray kit Place 4 mg into  the nose once as needed (overdose).  12/22/17  Yes [provider]  ondansetron (ZOFRAN) 8 MG tablet Take 1 tablet (8 mg total) by mouth every 8 (eight) hours as needed for nausea or vomiting. 04/11/19  Yes Lanae Boast, FNP  oxyCODONE (OXYCONTIN) 10 mg 12 hr tablet Take 1 tablet (10 mg total) by mouth every 12 (twelve) hours for 15 days. 05/11/19 05/26/19 Yes Lanae Boast, FNP  oxyCODONE (ROXICODONE) 15 MG immediate release tablet Take 1 tablet (15 mg total) by mouth every 6 (six) hours as needed for up to 15 days for pain. 05/11/19 05/26/19 Yes Lanae Boast, FNP  pantoprazole (PROTONIX) 40 MG tablet Take 1 tablet (40 mg total) by mouth daily. 05/10/19  Yes Lanae Boast, FNP     Allergies:     Allergies  Allergen Reactions  . Penicillins Swelling and Rash    Has patient had a PCN reaction causing immediate rash, facial/tongue/throat swelling, SOB or lightheadedness with hypotension: Yes Has patient had a PCN reaction causing severe rash involving mucus membranes or skin necrosis: Yes Has patient had a PCN reaction that required hospitalization No Has patient had a PCN reaction occurring within the last 10 years: no If all of the above answers are "NO", then may proceed with Cephalosporin use.      Physical Exam:   Vitals  Blood pressure 113/70, pulse 63, temperature 98.5 F (36.9 C), temperature source Oral, resp. rate 16, height 5' 3" (1.6 m), weight 63.5 kg, last menstrual period 04/21/2019, SpO2 99 %.  1.  General: axox3  2. Psychiatric: euthymic  3. Neurologic: cn2-12 intact, reflexes 2+ symmetric, diffuse with downgoing toes bilaterally, motor 5/5 in all 4 ext  4. HEENMT:  Anicteric, pupils 1.50m symmetric, direct, consensual, intact, eomi Mmm Neck: no jvd  5. Respiratory : CTAB   6. Cardiovascular : rrr s1, s2 no m/g/r  7. Gastrointestinal:  Abd: soft, nt, nd +bs  8. Skin:  Ext: no c/c/e, no rash  9.Musculoskeletal:  Good ROM,  No joint effusion   No adenopathy    Data Review:    CBC Recent Labs  Lab 05/10/19 1222 05/15/19 1231  WBC 5.3 6.2  HGB 11.2 10.8*  HCT 32.1* 29.4*  PLT 360 346  MCV 84 79.0*  MCH 29.3 29.0  MCHC 34.9 36.7*  RDW 15.2 14.0  LYMPHSABS 2.4 2.1  MONOABS  --  0.7  EOSABS 0.2 0.6*  BASOSABS 0.1 0.1   ------------------------------------------------------------------------------------------------------------------  Results for orders placed or performed during the hospital encounter of 05/15/19 (from the past 48 hour(s))  Comprehensive metabolic panel     Status: Abnormal   Collection Time: 05/15/19 12:31 PM  Result Value Ref Range   Sodium 138 135 - 145 mmol/L   Potassium 4.0 3.5 - 5.1 mmol/L   Chloride 105 98 - 111 mmol/L   CO2 26 22 - 32 mmol/L   Glucose, Bld 95 70 - 99 mg/dL   BUN 10 6 - 20 mg/dL   Creatinine, Ser 0.75 0.44 - 1.00 mg/dL   Calcium 8.5 (L) 8.9 - 10.3 mg/dL   Total Protein 6.6 6.5 - 8.1 g/dL   Albumin 3.6 3.5 - 5.0 g/dL   AST 20 15 - 41 U/L   ALT 14 0 - 44 U/L   Alkaline Phosphatase 44 38 - 126 U/L   Total Bilirubin 0.6 0.3 - 1.2 mg/dL   GFR calc non Af Amer >60 >60 mL/min   GFR calc Af Amer >60 >60 mL/min   Anion gap 7 5 - 15    Comment: Performed at [facility], [place] [address], [place], [place] [address]  CBC with Differential     Status: Abnormal   Collection Time: 05/15/19 12:31 PM  Result Value Ref Range   WBC 6.2 4.0 - 10.5 K/uL   RBC 3.72 (L) 3.87 - 5.11 MIL/uL   Hemoglobin 10.8 (L) 12.0 - 15.0 g/dL   HCT 29.4 (L) 36.0 - 46.0 %   MCV 79.0 (L) 80.0 - 100.0 fL   MCH 29.0 26.0 - 34.0 pg   MCHC 36.7 (H) 30.0 - 36.0 g/dL   RDW 14.0 11.5 - 15.5 %   Platelets 346 150 - 400 K/uL   nRBC 6.3 (H) 0.0 - 0.2 %   Neutrophils Relative % 44 %   Neutro Abs 2.7 1.7 - 7.7 K/uL   Lymphocytes Relative 34 %   Lymphs Abs 2.1 0.7 - 4.0 K/uL   Monocytes Relative 11 %   Monocytes Absolute 0.7 0.1 - 1.0 K/uL   Eosinophils Relative 10 %   Eosinophils Absolute  0.6 (H) 0.0 - 0.5 K/uL   Basophils Relative 1 %   Basophils Absolute 0.1 0.0 - 0.1 K/uL   Immature Granulocytes 0 %   Abs Immature Granulocytes 0.01 0.00 - 0.07 K/uL   Polychromasia PRESENT    Sickle Cells PRESENT    Target Cells PRESENT     Comment: Performed at [facility], [place] [address], [place], [place] [address]  Reticulocytes     Status: Abnormal   Collection Time: 05/15/19 12:31 PM  Result Value Ref Range   Retic Ct Pct 4.4 (H) 0.4 - 3.1 %   RBC. 3.72 (L) 3.87 - 5.11 MIL/uL   Retic Count, Absolute 162.2 19.0 - 186.0 K/uL   Immature Retic Fract 40.3 (H) 2.3 - 15.9 %    Comment: Performed at [facility], [place] [address] [place], [place] [address]  I-Stat beta hCG blood, ED     Status: None   Collection Time: 05/15/19 12:36 PM  Result Value Ref Range   I-stat hCG, quantitative <5.0 <5 mIU/mL   Comment 3            Comment:   GEST. AGE      CONC.  (mIU/mL)   <=1 WEEK  5 - 50     2 WEEKS       50 - 500     3 WEEKS       100 - 10,000     4 WEEKS     1,000 - 30,000        FEMALE AND NON-PREGNANT FEMALE:     LESS THAN 5 mIU/mL     Chemistries  Recent Labs  Lab 05/10/19 1222 05/15/19 1231  NA 139 138  K 3.7 4.0  CL 102 105  CO2 26 26  GLUCOSE 85 95  BUN 3* 10  CREATININE 0.76 0.75  CALCIUM 8.8 8.5*  AST 18 20  ALT 11 14  ALKPHOS 50 44  BILITOT 0.5 0.6   ------------------------------------------------------------------------------------------------------------------  ------------------------------------------------------------------------------------------------------------------ GFR: Estimated Creatinine Clearance: 88.8 mL/min (by C-G formula based on SCr of 0.75 mg/dL). Liver Function Tests: Recent Labs  Lab 05/10/19 1222 05/15/19 1231  AST 18 20  ALT 11 14  ALKPHOS 50 44  BILITOT 0.5 0.6  PROT 6.4 6.6  ALBUMIN 4.0 3.6   No results for input(s): LIPASE, AMYLASE in the last 168 hours. No results for  input(s): AMMONIA in the last 168 hours. Coagulation Profile: No results for input(s): INR, PROTIME in the last 168 hours. Cardiac Enzymes: No results for input(s): CKTOTAL, CKMB, CKMBINDEX, TROPONINI in the last 168 hours. BNP (last 3 results) No results for input(s): PROBNP in the last 8760 hours. HbA1C: No results for input(s): HGBA1C in the last 72 hours. CBG: No results for input(s): GLUCAP in the last 168 hours. Lipid Profile: No results for input(s): CHOL, HDL, LDLCALC, TRIG, CHOLHDL, LDLDIRECT in the last 72 hours. Thyroid Function Tests: No results for input(s): TSH, T4TOTAL, FREET4, T3FREE, THYROIDAB in the last 72 hours. Anemia Panel: Recent Labs    05/15/19 1231  RETICCTPCT 4.4*    --------------------------------------------------------------------------------------------------------------- Urine analysis:    Component Value Date/Time   COLORURINE YELLOW 03/15/2019 1230   APPEARANCEUR CLEAR 03/15/2019 1230   LABSPEC 1.023 03/15/2019 1230   PHURINE 5.0 03/15/2019 1230   GLUCOSEU NEGATIVE 03/15/2019 1230   HGBUR NEGATIVE 03/15/2019 1230   BILIRUBINUR neg 05/10/2019 [place] 03/15/2019 1230   PROTEINUR Positive (A) 05/10/2019 1222   PROTEINUR NEGATIVE 03/15/2019 1230   UROBILINOGEN 0.2 05/10/2019 1222   UROBILINOGEN 1.0 09/30/2017 1513   NITRITE neg 05/10/2019 1222   NITRITE NEGATIVE 03/15/2019 1230   LEUKOCYTESUR Negative 05/10/2019 1222   LEUKOCYTESUR NEGATIVE 03/15/2019 1230      Imaging Results:    Dg Shoulder Left  Result Date: 05/15/2019 CLINICAL DATA:  Diffuse left shoulder pain for the past 3 days. History of sickle cell disease. EXAM: LEFT SHOULDER - 2+ VIEW COMPARISON:  None. FINDINGS: There is no evidence of fracture or dislocation. There is no evidence of arthropathy or other focal bone abnormality. No sclerosis to suggest avascular necrosis. Soft tissues are unremarkable. IMPRESSION: Negative. Electronically Signed   By: Titus Dubin M.D.   On: 05/15/2019 15:05       Assessment & Plan:    Active Problems:   Sickle cell pain crisis (HCC)  Sickle cell pain crisis 1/2 NS at 159m per hour Dilaudid pca Hold oral pain medication for now, consider restart tomorrow zofran 438miv q6h prn n/v Benadryl [id number] q6h prn itching Recommended to patient that she google NIH and clinical trials for sickle cell If has further recurrent sickle cell crisis , consider Voxelotor     DVT Prophylaxis-  Lovenox - SCDs   AM Labs Ordered, also please review Full Orders  Family Communication: Admission, patients condition and plan of care including tests being ordered have been discussed with the patient  who indicate understanding and agree with the plan and Code Status.  Code Status:   FULL CODE  Admission status: Inpatient: Based on patients clinical presentation and evaluation of above clinical data, I have made determination that patient meets Inpatient criteria at this time.  Pt has failed multiple treatments of pain medication in ED.  Pt will require inpatient admission for iv pain medication and iv fluids, and thus >2 nites stay  Time spent in minutes : 55   Jani Gravel M.D on 05/15/2019 at 6:20 PM

## 2019-05-15 NOTE — Telephone Encounter (Signed)
Patient called, complained of generalized pain that she rated as 10/10. Have been having the pain for 3 days. Denied chest pain, fever, diarrhea, abdominal pain, nausea/vomitting. Screened negative for Covid-19 symptoms. Admitted to having means of transportation without driving self after treatment. Last took 15 mg of Oxycodone, 800 mg of Ibuprofen and 10 mg of Baclofen. Patient also endorsed swollen face, hands and legs. Per provider, patient should go to the nearest emergency room for evaluation and treatment. Patient notified, verbalized understanding.

## 2019-05-15 NOTE — ED Notes (Signed)
EDP at bedside  

## 2019-05-15 NOTE — ED Provider Notes (Signed)
I received the patient in signout from Dr. Particia Nearing, briefly the patient is a 33 year old female with a chief complaint of a sickle cell pain crisis.  Lab work is largely unremarkable.  She is complaining of new left shoulder pain a plain film was ordered and negative is viewed by me.  Patient's pain seems to localize to her lateral deltoid.  I think this is most likely muscular strain though she is limited in the supraspinatus strength.  Patient was given 3 doses of IV narcotics with some improvement but not resolution of her pain.  The patient thinks it is still too painful to go home at this point and requesting discussion with the hospitalist for admission.   Melene Plan, DO 05/15/19 2305

## 2019-05-15 NOTE — ED Notes (Signed)
Patient transported to X-ray 

## 2019-05-15 NOTE — Progress Notes (Signed)
Pt has arrived to unit. A&O x4. Pt is requesting food-- hasn't eaten much today, but hungry. Also, pt rates pain at 9 in left shoulder and hands and feet.

## 2019-05-15 NOTE — ED Notes (Signed)
ED TO INPATIENT HANDOFF REPORT  Name/Age/Gender Hannah ConferBeatrice Hawkins 33 y.o. female  Code Status    Code Status Orders  (From admission, onward)         Start     Ordered   05/15/19 1819  Full code  Continuous     05/15/19 1825        Code Status History    Date Active Date Inactive Code Status Order ID Comments User Context   03/15/2019 1230 03/15/2019 2015 Full Code [id number]  Massie MaroonHollis, [name], FNP Inpatient   11/22/2018 1147 11/22/2018 1943 Full Code [id number]  Massie MaroonHollis, [name], FNP Inpatient   11/21/2018 1311 11/21/2018 2034 Full Code [id number]  Massie MaroonHollis, [name], FNP Inpatient   08/14/2018 1051 08/14/2018 2043 Full Code [id number]  Massie MaroonHollis, [name], FNP Inpatient   11/14/2017 0047 11/17/2017 1925 Full Code [id number]  Clydie BraunSmith, Rondell A, MD ED   11/02/2017 1046 11/05/2017 1751 Full Code [id number]  Rometta EmeryGarba, Mohammad L, MD Inpatient   10/13/2017 1021 10/13/2017 1547 Full Code [id number]  Massie MaroonHollis, [name], FNP Inpatient      Home/SNF/Other Home  Chief Complaint Sickle cell crisis  Level of Care/Admitting Diagnosis ED Disposition    ED Disposition Condition Comment   Admit  [facility] [[id number][facility]  Level of Care: Telemetry [5]  Admit to tele based on following criteria: Monitor QTC interval  Covid Evaluation: Person Under Investigation (PUI)  Isolation Risk Level: Low Risk/Droplet (Less than 4L [place] supplementation)  Diagnosis: Sickle cell crisis [facility](HCC) [[id number]]) [[id number]]  Admitting Physician: Pearson GrippeKIM, JAMES [[id number]]  Attending Physician: Pearson GrippeKIM, JAMES [[id number]]  PT Class (Do Not Modify): Observation [104]  PT Acc Code (Do Not Modify): Observation [10022]       Medical History Past Medical History:  Diagnosis Date  . Sickle cell anemia (HCC)     Allergies Allergies  Allergen Reactions  . Penicillins Swelling and Rash    Has patient had a PCN reaction causing immediate rash, facial/tongue/throat swelling, SOB or lightheadedness with hypotension: Yes Has  patient had a PCN reaction causing severe rash involving mucus membranes or skin necrosis: Yes Has patient had a PCN reaction that required hospitalization No Has patient had a PCN reaction occurring within the last 10 years: no If all of the above answers are "NO", then may proceed with Cephalosporin use.     IV Location/Drains/Wounds Patient Lines/Drains/Airways Status   Active Line/Drains/Airways    Name:   Placement date:   Placement time:   Site:   Days:   Peripheral IV 05/15/19 Right Antecubital   05/15/19    1255    Antecubital   less than 1          Labs/Imaging Results for orders placed or performed during the hospital encounter of 05/15/19 (from the past 48 hour(s))  Comprehensive metabolic panel     Status: Abnormal   Collection Time: 05/15/19 12:31 PM  Result Value Ref Range   Sodium 138 135 - 145 mmol/L   Potassium 4.0 3.5 - 5.1 mmol/L   Chloride 105 98 - 111 mmol/L   CO2 26 22 - 32 mmol/L   Glucose, Bld 95 70 - 99 mg/dL   BUN 10 6 - 20 mg/dL   Creatinine, Ser 6.380.75 0.44 - 1.00 mg/dL   Calcium 8.5 (L) 8.9 - 10.3 mg/dL   Total Protein 6.6 6.5 - 8.1 g/dL   Albumin 3.6 3.5 - 5.0 g/dL   AST 20 15 - 41 U/L   ALT 14  0 - 44 U/L   Alkaline Phosphatase 44 38 - 126 U/L   Total Bilirubin 0.6 0.3 - 1.2 mg/dL   GFR calc non Af Amer >60 >60 mL/min   GFR calc Af Amer >60 >60 mL/min   Anion gap 7 5 - 15    Comment: Performed at [facility], [address] [place], [place] [address]  CBC with Differential     Status: Abnormal   Collection Time: 05/15/19 12:31 PM  Result Value Ref Range   WBC 6.2 4.0 - 10.5 K/uL   RBC 3.72 (L) 3.87 - 5.11 MIL/uL   Hemoglobin 10.8 (L) 12.0 - 15.0 g/dL   HCT 69.4 (L) 85.4 - 62.7 %   MCV 79.0 (L) 80.0 - 100.0 fL   MCH 29.0 26.0 - 34.0 pg   MCHC 36.7 (H) 30.0 - 36.0 g/dL   RDW 03.5 00.9 - 38.1 %   Platelets 346 150 - 400 K/uL   nRBC 6.3 (H) 0.0 - 0.2 %   Neutrophils Relative % 44 %   Neutro Abs 2.7 1.7 - 7.7 K/uL    Lymphocytes Relative 34 %   Lymphs Abs 2.1 0.7 - 4.0 K/uL   Monocytes Relative 11 %   Monocytes Absolute 0.7 0.1 - 1.0 K/uL   Eosinophils Relative 10 %   Eosinophils Absolute 0.6 (H) 0.0 - 0.5 K/uL   Basophils Relative 1 %   Basophils Absolute 0.1 0.0 - 0.1 K/uL   Immature Granulocytes 0 %   Abs Immature Granulocytes 0.01 0.00 - 0.07 K/uL   Polychromasia PRESENT    Sickle Cells PRESENT    Target Cells PRESENT     Comment: Performed at [facility], [address] [place], [place] [address]  Reticulocytes     Status: Abnormal   Collection Time: 05/15/19 12:31 PM  Result Value Ref Range   Retic Ct Pct 4.4 (H) 0.4 - 3.1 %   RBC. 3.72 (L) 3.87 - 5.11 MIL/uL   Retic Count, Absolute 162.2 19.0 - 186.0 K/uL   Immature Retic Fract 40.3 (H) 2.3 - 15.9 %    Comment: Performed at [facility], [address], [place], [place] [address]  I-Stat beta hCG blood, ED     Status: None   Collection Time: 05/15/19 12:36 PM  Result Value Ref Range   I-stat hCG, quantitative <5.0 <5 mIU/mL   Comment 3            Comment:   GEST. AGE      CONC.  (mIU/mL)   <=1 WEEK        5 - 50     2 WEEKS       50 - 500     3 WEEKS       100 - 10,000     4 WEEKS     1,000 - 30,000        FEMALE AND NON-PREGNANT FEMALE:     LESS THAN 5 mIU/mL    Dg Shoulder Left  Result Date: 05/15/2019 CLINICAL DATA:  Diffuse left shoulder pain for the past 3 days. History of sickle cell disease. EXAM: LEFT SHOULDER - 2+ VIEW COMPARISON:  None. FINDINGS: There is no evidence of fracture or dislocation. There is no evidence of arthropathy or other focal bone abnormality. No sclerosis to suggest avascular necrosis. Soft tissues are unremarkable. IMPRESSION: Negative. Electronically Signed   By: Obie Dredge M.D.   On: 05/15/2019 15:05  Pending Labs Unresulted Labs (From admission, onward)    Start     Ordered   05/22/19 0500  Creatinine, serum  (enoxaparin (LOVENOX)    CrCl >/= 30  ml/min)  Weekly,   R    Comments:  while on enoxaparin therapy    05/15/19 1825   05/16/19 0500  Comprehensive metabolic panel  Tomorrow morning,   R     05/15/19 1825   05/16/19 0500  CBC  Tomorrow morning,   R     05/15/19 1825   05/15/19 1819  SARS Coronavirus 2 (CEPHEID - Performed in [facility] hospital lab), Hosp Order  (Asymptomatic Patients Labs)  ONCE - STAT,   R    Question:  Rule Out  Answer:  Yes   05/15/19 1818   05/15/19 1234  Urinalysis, Routine w reflex microscopic  ONCE - STAT,   STAT     05/15/19 1234          Vitals/Pain Today's Vitals   05/15/19 1800 05/15/19 1804 05/15/19 1808 05/15/19 1830  BP:  113/70  130/75  Pulse:  63 63 71  Resp:  16    Temp:      TempSrc:      SpO2:  99% 99% 97%  Weight:      Height:      PainSc: 10-Worst pain ever       Isolation Precautions No active isolations  Medications Medications  0.45 % sodium chloride infusion ( Intravenous New Bag/Given 05/15/19 1258)  HYDROmorphone (DILAUDID) injection 2 mg (2 mg Intravenous Given 05/15/19 1805)    Or  HYDROmorphone (DILAUDID) injection 2 mg ( Subcutaneous See Alternative 05/15/19 1805)  ondansetron (ZOFRAN) injection 4 mg (has no administration in time range)  senna-docusate (Senokot-S) tablet 1 tablet (has no administration in time range)  polyethylene glycol (MIRALAX / GLYCOLAX) packet 17 g (has no administration in time range)  enoxaparin (LOVENOX) injection 40 mg (has no administration in time range)  dextrose 5 %-0.45 % sodium chloride infusion (has no administration in time range)  ketorolac (TORADOL) 15 MG/ML injection 15 mg (has no administration in time range)  sodium chloride flush (NS) 0.9 % injection 3 mL (3 mLs Intravenous Given 05/15/19 1300)  ketorolac (TORADOL) 30 MG/ML injection 30 mg (30 mg Intravenous Given 05/15/19 1247)  HYDROmorphone (DILAUDID) injection 2 mg (2 mg Intravenous Given 05/15/19 1252)    Or  HYDROmorphone (DILAUDID) injection 2 mg ( Subcutaneous  See Alternative 05/15/19 1252)  HYDROmorphone (DILAUDID) injection 2 mg (2 mg Intravenous Given 05/15/19 1423)    Or  HYDROmorphone (DILAUDID) injection 2 mg ( Subcutaneous See Alternative 05/15/19 1423)  HYDROmorphone (DILAUDID) injection 2 mg (2 mg Intravenous Given 05/15/19 1504)    Or  HYDROmorphone (DILAUDID) injection 2 mg ( Subcutaneous See Alternative 05/15/19 1504)  diphenhydrAMINE (BENADRYL) injection 25 mg (25 mg Intravenous Given 05/15/19 1247)    Mobility walks

## 2019-05-15 NOTE — ED Notes (Signed)
Pt ambulatory to bathroom without assist

## 2019-05-16 DIAGNOSIS — D5700 Hb-SS disease with crisis, unspecified: Secondary | ICD-10-CM | POA: Diagnosis not present

## 2019-05-16 LAB — COMPREHENSIVE METABOLIC PANEL
ALT: 16 U/L (ref 0–44)
AST: 27 U/L (ref 15–41)
Albumin: 4.1 g/dL (ref 3.5–5.0)
Alkaline Phosphatase: 48 U/L (ref 38–126)
Anion gap: 10 (ref 5–15)
BUN: 6 mg/dL (ref 6–20)
CO2: 25 mmol/L (ref 22–32)
Calcium: 8.8 mg/dL — ABNORMAL LOW (ref 8.9–10.3)
Chloride: 101 mmol/L (ref 98–111)
Creatinine, Ser: 0.72 mg/dL (ref 0.44–1.00)
GFR calc Af Amer: >60 mL/min (ref >60)
GFR calc non Af Amer: >60 mL/min (ref >60)
Glucose, Bld: 112 mg/dL — ABNORMAL HIGH (ref 70–99)
Potassium: 3.3 mmol/L — ABNORMAL LOW (ref 3.5–5.1)
Sodium: 136 mmol/L (ref 135–145)
Total Bilirubin: 1.2 mg/dL (ref 0.3–1.2)
Total Protein: 7.4 g/dL (ref 6.5–8.1)

## 2019-05-16 LAB — RAPID URINE DRUG SCREEN, HOSP PERFORMED
Amphetamines: NOT DETECTED
Barbiturates: NOT DETECTED
Benzodiazepines: NOT DETECTED
Cocaine: NOT DETECTED
Opiates: POSITIVE — AB
Tetrahydrocannabinol: NOT DETECTED

## 2019-05-16 LAB — CBC
HCT: 32.6 % — ABNORMAL LOW (ref 36.0–46.0)
Hemoglobin: 12 g/dL (ref 12.0–15.0)
MCH: 29.6 pg (ref 26.0–34.0)
MCHC: 36.8 g/dL — ABNORMAL HIGH (ref 30.0–36.0)
MCV: 80.3 fL (ref 80.0–100.0)
Platelets: 284 10*3/uL (ref 150–400)
RBC: 4.06 MIL/uL (ref 3.87–5.11)
RDW: 14.1 % (ref 11.5–15.5)
WBC: 10.1 10*3/uL (ref 4.0–10.5)
nRBC: 5.8 % — ABNORMAL HIGH (ref 0.0–0.2)

## 2019-05-16 MED ORDER — ALUM & MAG HYDROXIDE-SIMETH 200-200-20 MG/5ML PO SUSP
30.0000 mL | Freq: Four times a day (QID) | ORAL | Status: DC | PRN
  Administered 2019-05-16 – 2019-05-17 (×3): 30 mL via ORAL
  Filled 2019-05-16 (×3): qty 30

## 2019-05-16 MED ORDER — DIPHENHYDRAMINE HCL 25 MG PO CAPS
25.0000 mg | ORAL_CAPSULE | ORAL | Status: DC | PRN
  Administered 2019-05-16: 25 mg via ORAL
  Filled 2019-05-16: qty 1

## 2019-05-16 MED ORDER — NALOXONE HCL 0.4 MG/ML IJ SOLN: 0.4000 mg | INTRAMUSCULAR | Status: DC | PRN

## 2019-05-16 MED ORDER — OXYCODONE HCL 5 MG PO TABS
15.0000 mg | ORAL_TABLET | ORAL | Status: DC
  Administered 2019-05-16 – 2019-05-18 (×12): 15 mg via ORAL
  Filled 2019-05-16 (×12): qty 3

## 2019-05-16 MED ORDER — HYDROMORPHONE 1 MG/ML IV SOLN
INTRAVENOUS | Status: DC
  Administered 2019-05-16: 2 mg via INTRAVENOUS
  Administered 2019-05-16: 0.5 mg via INTRAVENOUS

## 2019-05-16 MED ORDER — PROMETHAZINE HCL 25 MG PO TABS
12.5000 mg | ORAL_TABLET | ORAL | Status: DC | PRN
  Administered 2019-05-18: 12.5 mg via ORAL
  Filled 2019-05-16: qty 1

## 2019-05-16 MED ORDER — IBUPROFEN 200 MG PO TABS
600.0000 mg | ORAL_TABLET | Freq: Four times a day (QID) | ORAL | Status: DC | PRN
  Administered 2019-05-16 – 2019-05-17 (×3): 600 mg via ORAL
  Filled 2019-05-16 (×3): qty 3

## 2019-05-16 MED ORDER — FLUTICASONE PROPIONATE 50 MCG/ACT NA SUSP
2.0000 | Freq: Every day | NASAL | Status: DC
  Administered 2019-05-16 – 2019-05-18 (×3): 2 via NASAL
  Filled 2019-05-16: qty 16

## 2019-05-16 MED ORDER — DIPHENHYDRAMINE HCL 25 MG PO CAPS: 25.0000 mg | ORAL_CAPSULE | ORAL | Status: DC | PRN

## 2019-05-16 MED ORDER — OXYCODONE HCL 5 MG PO TABS
10.0000 mg | ORAL_TABLET | ORAL | Status: DC | PRN
  Administered 2019-05-16: 10 mg via ORAL
  Filled 2019-05-16: qty 2

## 2019-05-16 MED ORDER — SODIUM CHLORIDE 0.9% FLUSH: 9.0000 mL | INTRAVENOUS | Status: DC | PRN

## 2019-05-16 MED ORDER — SODIUM CHLORIDE 0.9 % IV SOLN
25.0000 mg | INTRAVENOUS | Status: DC | PRN
  Filled 2019-05-16: qty 0.5

## 2019-05-16 MED ORDER — ALUM & MAG HYDROXIDE-SIMETH 200-200-20 MG/5ML PO SUSP
30.0000 mL | Freq: Once | ORAL | Status: AC
  Administered 2019-05-16: 30 mL via ORAL
  Filled 2019-05-16: qty 30

## 2019-05-16 MED ORDER — ONDANSETRON HCL 4 MG/2ML IJ SOLN
4.0000 mg | Freq: Four times a day (QID) | INTRAMUSCULAR | Status: DC | PRN
  Administered 2019-05-16: 12:00:00 4 mg via INTRAVENOUS
  Filled 2019-05-16: qty 2

## 2019-05-16 MED ORDER — HYDROMORPHONE 1 MG/ML IV SOLN: INTRAVENOUS | Status: DC

## 2019-05-16 MED ORDER — PROMETHAZINE HCL 25 MG/ML IJ SOLN: 12.5000 mg | Freq: Four times a day (QID) | INTRAMUSCULAR | Status: DC | PRN

## 2019-05-16 NOTE — Progress Notes (Signed)
Subjective: Hannah Hawkins, a 33 year old female with a medical history significant for sickle cell disease, chronic pain syndrome, opiate dependence, and history of cocaine use was admitted for sickle cell pain crisis. Patient continues to complain of pain primarily to left shoulder, and bilateral lower extremities.  Current pain intensity 10/10 despite IV Dilaudid PCA.  Patient states that she has been unable to keep safety devices in place due to nasal congestion. She has not been maximizing Dilaudid PCA. Patient afebrile and maintaining oxygen saturation at 97% on RA.  Patient denies headache, chest pain, shortness of breath, persistent cough, nausea, vomiting, diarrhea, dysuria, or constipation.  Objective:  Vital signs in last 24 hours:  Vitals:   05/16/19 0416 05/16/19 0747 05/16/19 0816 05/16/19 0834  BP: 127/81   126/78  Pulse: 60   64  Resp: 16 16  12   Temp: (!) 97.5 F (36.4 C)   97.7 F (36.5 C)  TempSrc:    Oral  SpO2: 100% 97% 94% 96%  Weight:      Height:        Intake/Output from previous day:   Intake/Output Summary (Last 24 hours) at 05/16/2019 1108 Last data filed at 05/16/2019 0959 Gross per 24 hour  Intake -  Output 600 ml  Net -600 ml   Physical Exam Constitutional:      Appearance: Normal appearance.  HENT:     Head: Normocephalic.     Nose: Nose normal.     Mouth/Throat:     Mouth: Mucous membranes are moist.  Eyes:     Pupils: Pupils are equal, round, and reactive to light.  Cardiovascular:     Rate and Rhythm: Normal rate and regular rhythm.     Pulses: Normal pulses.     Heart sounds: Normal heart sounds.  Pulmonary:     Effort: Pulmonary effort is normal.     Breath sounds: Normal breath sounds.  Abdominal:     General: Bowel sounds are normal.     Palpations: Abdomen is soft.  Musculoskeletal:     Left shoulder: She exhibits decreased range of motion, tenderness, pain and decreased strength. She exhibits no swelling and no  crepitus.  Skin:    General: Skin is warm and dry.  Neurological:     General: No focal deficit present.     Mental Status: She is alert. Mental status is at baseline.  Psychiatric:        Mood and Affect: Mood normal.        Behavior: Behavior normal.        Thought Content: Thought content normal.        Judgment: Judgment normal.     Lab Results:  Basic Metabolic Panel:    Component Value Date/Time   NA 136 05/16/2019 0613   NA 139 05/10/2019 1222   K 3.3 (L) 05/16/2019 0613   CL 101 05/16/2019 0613   CO2 25 05/16/2019 0613   BUN 6 05/16/2019 0613   BUN 3 (L) 05/10/2019 1222   CREATININE 0.72 05/16/2019 0613   CREATININE 0.69 08/04/2017 1208   GLUCOSE 112 (H) 05/16/2019 0613   CALCIUM 8.8 (L) 05/16/2019 0613   CBC:    Component Value Date/Time   WBC 10.1 05/16/2019 0613   HGB 12.0 05/16/2019 0613   HGB 11.2 05/10/2019 1222   HCT 32.6 (L) 05/16/2019 0613   HCT 32.1 (L) 05/10/2019 1222   PLT 284 05/16/2019 0613   PLT 360 05/10/2019 1222   MCV 80.3 05/16/2019  4098   MCV 84 05/10/2019 1222   NEUTROABS 2.7 05/15/2019 1231   NEUTROABS 2.1 05/10/2019 1222   LYMPHSABS 2.1 05/15/2019 1231   LYMPHSABS 2.4 05/10/2019 1222   MONOABS 0.7 05/15/2019 1231   EOSABS 0.6 (H) 05/15/2019 1231   EOSABS 0.2 05/10/2019 1222   BASOSABS 0.1 05/15/2019 1231   BASOSABS 0.1 05/10/2019 1222    Recent Results (from the past 240 hour(s))  SARS Coronavirus 2 (CEPHEID - Performed in [facility] hospital lab), Hosp Order     Status: None   Collection Time: 05/15/19  6:26 PM  Result Value Ref Range Status   SARS Coronavirus 2 NEGATIVE NEGATIVE Final    Comment: (NOTE) If result is NEGATIVE SARS-CoV-2 target nucleic acids are NOT DETECTED. The SARS-CoV-2 RNA is generally detectable in upper and lower  respiratory specimens during the acute phase of infection. The lowest  concentration of SARS-CoV-2 viral copies this assay can detect is 250  copies / mL. A negative result does not  preclude SARS-CoV-2 infection  and should not be used as the sole basis for treatment or other  patient management decisions.  A negative result may occur with  improper specimen collection / handling, submission of specimen other  than nasopharyngeal swab, presence of viral mutation(s) within the  areas targeted by this assay, and inadequate number of viral copies  (<250 copies / mL). A negative result must be combined with clinical  observations, patient history, and epidemiological information. If result is POSITIVE SARS-CoV-2 target nucleic acids are DETECTED. The SARS-CoV-2 RNA is generally detectable in upper and lower  respiratory specimens dur ing the acute phase of infection.  Positive  results are indicative of active infection with SARS-CoV-2.  Clinical  correlation with patient history and other diagnostic information is  necessary to determine patient infection status.  Positive results do  not rule out bacterial infection or co-infection with other viruses. If result is PRESUMPTIVE POSTIVE SARS-CoV-2 nucleic acids MAY BE PRESENT.   A presumptive positive result was obtained on the submitted specimen  and confirmed on repeat testing.  While 2019 novel coronavirus  (SARS-CoV-2) nucleic acids may be present in the submitted sample  additional confirmatory testing may be necessary for epidemiological  and / or clinical management purposes  to differentiate between  SARS-CoV-2 and other Sarbecovirus currently known to infect humans.  If clinically indicated additional testing with an alternate test  methodology [phone]) is advised. The SARS-CoV-2 RNA is generally  detectable in upper and lower respiratory sp ecimens during the acute  phase of infection. The expected result is Negative. Fact Sheet for Patients:  BoilerBrush.com.cy Fact Sheet for Healthcare Providers: https://pope.com/ This test is not yet approved or cleared by  the [place] FDA and has been authorized for detection and/or diagnosis of SARS-CoV-2 by FDA under an Emergency Use Authorization (EUA).  This EUA will remain in effect (meaning this test can be used) for the duration of the COVID-19 declaration under Section 564(b)(1) of the Act, 21 U.S.C. section 360bbb-3(b)(1), unless the authorization is terminated or revoked sooner. Performed at [facility], [address] [place], [place] [address]     Studies/Results: Dg Shoulder Left  Result Date: 05/15/2019 CLINICAL DATA:  Diffuse left shoulder pain for the past 3 days. History of sickle cell disease. EXAM: LEFT SHOULDER - 2+ VIEW COMPARISON:  None. FINDINGS: There is no evidence of fracture or dislocation. There is no evidence of arthropathy or other focal bone abnormality. No sclerosis to  suggest avascular necrosis. Soft tissues are unremarkable. IMPRESSION: Negative. Electronically Signed   By: Obie Dredge M.D.   On: 05/15/2019 15:05    Medications: Scheduled Meds: . enoxaparin (LOVENOX) injection  40 mg Subcutaneous Q24H  . fluticasone  2 spray Each Nare Daily  . HYDROmorphone   Intravenous Q4H  . ketorolac  15 mg Intravenous Q6H  . senna-docusate  1 tablet Oral BID   Continuous Infusions: . sodium chloride Stopped (05/15/19 1949)  . diphenhydrAMINE     PRN Meds:.alum & mag hydroxide-simeth, diphenhydrAMINE **OR** diphenhydrAMINE, naloxone **AND** sodium chloride flush, ondansetron, ondansetron (ZOFRAN) IV, oxyCODONE, polyethylene glycol, promethazine  Assessment/Plan: Active Problems:   Sickle cell pain crisis (HCC)   Sickle cell crisis (HCC)  Sickle cell pain crisis: Continue D5 0.45% saline at 100 mL/h Continue IV Dilaudid PCA per weight-based protocol Oxycodone 10 mg every 4 hours as needed for moderate to severe breakthrough pain. IV Toradol 15 mg every 6 hours 2 L of supplemental oxygen as needed, maintain oxygen saturation above 90%  Sickle  cell anemia: Folic acid 1 mg Continue hydroxyurea per home medication regimen    Chronic pain syndrome: Continue home medications  History of cocaine use: OxyContin 10 mg every 12 hours Urine drug screen negative  Nausea/vomiting: Continue IV fluids Antiemetics Advance diet as tolerated  Possible adhesive capsulitis:  Left shoulder xray negative. Decreased ROM on PE. Continue IV Toradol 15 mg every 6 hours PT consult   Code Status: Full Code Family Communication: N/A Disposition Plan: Not yet ready for discharge  Lindsee Labarre Rennis Petty  APRN, MSN, FNP-C Patient Care Center [facility] [address] [place], [place] [address] [phone]  If 5PM-7AM, please contact night-coverage.  05/16/2019, 11:08 AM  LOS: 0 days

## 2019-05-16 NOTE — Progress Notes (Signed)
Pt noticed that a cluster of small, itchy bumps were forming around and under the electrodes for the telemetry. Pt requested to have the box taken off until itching subsides. Pt has been running NSR all evening, tele was placed on standby.

## 2019-05-17 DIAGNOSIS — D638 Anemia in other chronic diseases classified elsewhere: Secondary | ICD-10-CM | POA: Diagnosis not present

## 2019-05-17 DIAGNOSIS — G43909 Migraine, unspecified, not intractable, without status migrainosus: Secondary | ICD-10-CM | POA: Diagnosis present

## 2019-05-17 DIAGNOSIS — Z87891 Personal history of nicotine dependence: Secondary | ICD-10-CM | POA: Diagnosis not present

## 2019-05-17 DIAGNOSIS — Z880 Allergy status to penicillin: Secondary | ICD-10-CM | POA: Diagnosis not present

## 2019-05-17 DIAGNOSIS — G894 Chronic pain syndrome: Secondary | ICD-10-CM | POA: Diagnosis present

## 2019-05-17 DIAGNOSIS — Z833 Family history of diabetes mellitus: Secondary | ICD-10-CM | POA: Diagnosis not present

## 2019-05-17 DIAGNOSIS — Z20828 Contact with and (suspected) exposure to other viral communicable diseases: Secondary | ICD-10-CM | POA: Diagnosis present

## 2019-05-17 DIAGNOSIS — Z87898 Personal history of other specified conditions: Secondary | ICD-10-CM | POA: Diagnosis not present

## 2019-05-17 DIAGNOSIS — D5700 Hb-SS disease with crisis, unspecified: Secondary | ICD-10-CM | POA: Diagnosis present

## 2019-05-17 MED ORDER — HYDROMORPHONE HCL 1 MG/ML IJ SOLN
1.0000 mg | INTRAMUSCULAR | Status: DC | PRN
  Administered 2019-05-18: 2 mg via SUBCUTANEOUS
  Filled 2019-05-17: qty 2

## 2019-05-17 MED ORDER — OXYCODONE HCL ER 10 MG PO T12A
10.0000 mg | EXTENDED_RELEASE_TABLET | Freq: Two times a day (BID) | ORAL | Status: DC
  Administered 2019-05-17 – 2019-05-18 (×3): 10 mg via ORAL
  Filled 2019-05-17 (×3): qty 1

## 2019-05-17 NOTE — Evaluation (Signed)
Physical Therapy Evaluation Patient Details Name: Hannah ConferBeatrice Hawkins MRN: [id number] DOB: Apr 26, 1986 Today's Date: 05/17/2019   History of Present Illness  33 year old female with a medical history significant for sickle cell disease, chronic pain syndrome, opiate dependence, and history of cocaine use was admitted for sickle cell pain crisis.  Clinical Impression  Pt is independent with mobility, she ambulated 450' without an assistive device, no loss of balance. No further PT indicated. Encouraged pt to ambulate in halls during hospitalization to minimize deconditioning. PT signing off.     Follow Up Recommendations No PT follow up    Equipment Recommendations  None recommended by PT    Recommendations for Other Services       Precautions / Restrictions Precautions Precautions: None Restrictions Weight Bearing Restrictions: No      Mobility  Bed Mobility Overal bed mobility: Independent                Transfers Overall transfer level: Independent                  Ambulation/Gait Ambulation/Gait assistance: Independent Gait Distance (Feet): 450 Feet Assistive device: None Gait Pattern/deviations: WFL(Within Functional Limits) Gait velocity: WFL   General Gait Details: steady, no loss of balance  Stairs            Wheelchair Mobility    Modified Rankin (Stroke Patients Only)       Balance Overall balance assessment: No apparent balance deficits (not formally assessed)                                           Pertinent Vitals/Pain Pain Assessment: 0-10 Pain Score: 5  Pain Location: L shoulder with elevation Pain Descriptors / Indicators: Sore Pain Intervention(s): Limited activity within patient's tolerance;Monitored during session;Premedicated before session    Home Living Family/patient expects to be discharged to:: Private residence Living Arrangements: Spouse/significant other;Children(13 y.o. daughter and  boyfriend) Available Help at Discharge: Family;Available 24 hours/day   Home Access: Stairs to enter   Entrance Stairs-Number of Steps: 2 Home Layout: One level Home Equipment: None      Prior Function Level of Independence: Independent               Hand Dominance   Dominant Hand: Right    Extremity/Trunk Assessment   Upper Extremity Assessment Upper Extremity Assessment: Overall WFL for tasks assessed(L shoulder flexion painful but full AROM)    Lower Extremity Assessment Lower Extremity Assessment: Overall WFL for tasks assessed(R knee extension 4/5, L knee ext 5/5)       Communication   Communication: No difficulties  Cognition Arousal/Alertness: Awake/alert Behavior During Therapy: WFL for tasks assessed/performed Overall Cognitive Status: Within Functional Limits for tasks assessed                                        General Comments      Exercises     Assessment/Plan    PT Assessment Patent does not need any further PT services  PT Problem List         PT Treatment Interventions      PT Goals (Current goals can be found in the Care Plan section)  Acute Rehab PT Goals Patient Stated Goal: likes to cook PT Goal Formulation: All assessment and education  complete, DC therapy    Frequency     Barriers to discharge        Co-evaluation               AM-PAC PT "6 Clicks" Mobility  Outcome Measure Help needed turning from your back to your side while in a flat bed without using bedrails?: None Help needed moving from lying on your back to sitting on the side of a flat bed without using bedrails?: None Help needed moving to and from a bed to a chair (including a wheelchair)?: None Help needed standing up from a chair using your arms (e.g., wheelchair or bedside chair)?: None Help needed to walk in hospital room?: None Help needed climbing 3-5 steps with a railing? : None 6 Click Score: 24    End of Session    Activity Tolerance: Patient tolerated treatment well Patient left: Other (comment)(up walking in hallway) Nurse Communication: Mobility status      Time: 1254-1310 PT Time Calculation (min) (ACUTE ONLY): 16 min   Charges:   PT Evaluation $PT Eval Low Complexity: 1 Low          Hannah Hawkins PT 05/17/2019  Acute Rehabilitation Services Pager [phone] Office [phone]

## 2019-05-17 NOTE — Progress Notes (Signed)
Subjective: Hannah Hawkins, a 33 year old female with a medical history significant for sickle cell disease, chronic pain syndrome, opiate dependence, history of cocaine use was admitted for sickle cell pain crisis. Patient has a poor venous access and lost IV site overnight.  Patient's pain managed by oxycodone 15 mg every 4 hours while awake.  Patient states that she is continuing to have severe pain, primarily to left shoulder. Patient afebrile and maintaining oxygen saturation at 99% on RA. She denies headache, chest pain, shortness of breath, sore throat, persistent cough, dysuria, nausea, vomiting, or diarrhea.  Objective:  Vital signs in last 24 hours:  Vitals:   05/16/19 2052 05/16/19 2220 05/17/19 0542 05/17/19 0926  BP: 125/84  107/66 (!) 89/48  Pulse: 90 88 66 68  Resp: 19 18 12 18   Temp: 98.4 F (36.9 C)  98.2 F (36.8 C) 98.4 F (36.9 C)  TempSrc: Oral  Oral Oral  SpO2: 98%  99% 97%  Weight:      Height:        Intake/Output from previous day:   Intake/Output Summary (Last 24 hours) at 05/17/2019 1218 Last data filed at 05/17/2019 0900 Gross per 24 hour  Intake 1159.48 ml  Output 552 ml  Net 607.48 ml   Physical Exam Constitutional:      Appearance: Normal appearance.  Cardiovascular:     Rate and Rhythm: Normal rate and regular rhythm.     Pulses: Normal pulses.     Heart sounds: Normal heart sounds.  Pulmonary:     Effort: Pulmonary effort is normal.     Breath sounds: Normal breath sounds.  Abdominal:     General: Abdomen is flat. Bowel sounds are normal.  Musculoskeletal:     Left shoulder: She exhibits decreased range of motion, pain and decreased strength.  Neurological:     General: No focal deficit present.     Mental Status: She is alert. Mental status is at baseline.  Psychiatric:        Mood and Affect: Mood normal.        Behavior: Behavior normal.        Thought Content: Thought content normal.        Judgment: Judgment normal.      Lab Results:  Basic Metabolic Panel:    Component Value Date/Time   NA 136 05/16/2019 0613   NA 139 05/10/2019 1222   K 3.3 (L) 05/16/2019 0613   CL 101 05/16/2019 0613   CO2 25 05/16/2019 0613   BUN 6 05/16/2019 0613   BUN 3 (L) 05/10/2019 1222   CREATININE 0.72 05/16/2019 0613   CREATININE 0.69 08/04/2017 1208   GLUCOSE 112 (H) 05/16/2019 0613   CALCIUM 8.8 (L) 05/16/2019 0613   CBC:    Component Value Date/Time   WBC 10.1 05/16/2019 0613   HGB 12.0 05/16/2019 0613   HGB 11.2 05/10/2019 1222   HCT 32.6 (L) 05/16/2019 0613   HCT 32.1 (L) 05/10/2019 1222   PLT 284 05/16/2019 0613   PLT 360 05/10/2019 1222   MCV 80.3 05/16/2019 0613   MCV 84 05/10/2019 1222   NEUTROABS 2.7 05/15/2019 1231   NEUTROABS 2.1 05/10/2019 1222   LYMPHSABS 2.1 05/15/2019 1231   LYMPHSABS 2.4 05/10/2019 1222   MONOABS 0.7 05/15/2019 1231   EOSABS 0.6 (H) 05/15/2019 1231   EOSABS 0.2 05/10/2019 1222   BASOSABS 0.1 05/15/2019 1231   BASOSABS 0.1 05/10/2019 1222    Recent Results (from the past 240 hour(s))  SARS  Coronavirus 2 (CEPHEID - Performed in [facility] hospital lab), Hosp Order     Status: None   Collection Time: 05/15/19  6:26 PM  Result Value Ref Range Status   SARS Coronavirus 2 NEGATIVE NEGATIVE Final    Comment: (NOTE) If result is NEGATIVE SARS-CoV-2 target nucleic acids are NOT DETECTED. The SARS-CoV-2 RNA is generally detectable in upper and lower  respiratory specimens during the acute phase of infection. The lowest  concentration of SARS-CoV-2 viral copies this assay can detect is 250  copies / mL. A negative result does not preclude SARS-CoV-2 infection  and should not be used as the sole basis for treatment or other  patient management decisions.  A negative result may occur with  improper specimen collection / handling, submission of specimen other  than nasopharyngeal swab, presence of viral mutation(s) within the  areas targeted by this assay, and inadequate  number of viral copies  (<250 copies / mL). A negative result must be combined with clinical  observations, patient history, and epidemiological information. If result is POSITIVE SARS-CoV-2 target nucleic acids are DETECTED. The SARS-CoV-2 RNA is generally detectable in upper and lower  respiratory specimens dur ing the acute phase of infection.  Positive  results are indicative of active infection with SARS-CoV-2.  Clinical  correlation with patient history and other diagnostic information is  necessary to determine patient infection status.  Positive results do  not rule out bacterial infection or co-infection with other viruses. If result is PRESUMPTIVE POSTIVE SARS-CoV-2 nucleic acids MAY BE PRESENT.   A presumptive positive result was obtained on the submitted specimen  and confirmed on repeat testing.  While 2019 novel coronavirus  (SARS-CoV-2) nucleic acids may be present in the submitted sample  additional confirmatory testing may be necessary for epidemiological  and / or clinical management purposes  to differentiate between  SARS-CoV-2 and other Sarbecovirus currently known to infect humans.  If clinically indicated additional testing with an alternate test  methodology [phone]) is advised. The SARS-CoV-2 RNA is generally  detectable in upper and lower respiratory sp ecimens during the acute  phase of infection. The expected result is Negative. Fact Sheet for Patients:  BoilerBrush.com.cy Fact Sheet for Healthcare Providers: https://pope.com/ This test is not yet approved or cleared by the [place] FDA and has been authorized for detection and/or diagnosis of SARS-CoV-2 by FDA under an Emergency Use Authorization (EUA).  This EUA will remain in effect (meaning this test can be used) for the duration of the COVID-19 declaration under Section 564(b)(1) of the Act, 21 U.S.C. section 360bbb-3(b)(1), unless the  authorization is terminated or revoked sooner. Performed at [facility], [address], [place], [place] [address]     Studies/Results: Dg Shoulder Left  Result Date: 05/15/2019 CLINICAL DATA:  Diffuse left shoulder pain for the past 3 days. History of sickle cell disease. EXAM: LEFT SHOULDER - 2+ VIEW COMPARISON:  None. FINDINGS: There is no evidence of fracture or dislocation. There is no evidence of arthropathy or other focal bone abnormality. No sclerosis to suggest avascular necrosis. Soft tissues are unremarkable. IMPRESSION: Negative. Electronically Signed   By: Obie Dredge M.D.   On: 05/15/2019 15:05    Medications: Scheduled Meds: . enoxaparin (LOVENOX) injection  40 mg Subcutaneous Q24H  . fluticasone  2 spray Each Nare Daily  . oxyCODONE  15 mg Oral Q4H while awake  . oxyCODONE  10 mg Oral Q12H  . senna-docusate  1 tablet Oral BID  Continuous Infusions: PRN Meds:.alum & mag hydroxide-simeth, diphenhydrAMINE, HYDROmorphone (DILAUDID) injection, ibuprofen, polyethylene glycol, promethazine   Assessment/Plan: Active Problems:   Sickle cell anemia with crisis (HCC)   Sickle cell pain crisis (HCC)   Sickle cell crisis (HCC)  Sickle cell pain crisis: Patient has poor venous access, refusing IV team consult to restart. Oxycodone 10 mg every 4 hours while awake Dilaudid 2 mg SQ every 4 hours as needed for moderate to severe breakthrough pain.  OxyContin 10 mg every 12 hours Ibuprofen 600 mg every 8 hours as needed  Sickle cell anemia: Hemoglobin stable.  No indication for blood transfusion at this time. Follow CBC in a.m.  Chronic pain syndrome: Continue home medications  Nausea/vomiting: Antiemetics as needed.  Patient tolerating diet well.  Resolved.  Possible adhesive capsulitis: Left shoulder x-ray negative.  Decreased ROM on PE. Awaiting PT consult   Code Status: Full Code Family Communication: N/A Disposition Plan: Not yet  ready for discharge  Nolon Nations  APRN, MSN, FNP-C Patient Care Center [facility] [address] [place], [place] [address] [phone]  If 5PM-7AM, please contact night-coverage.  05/17/2019, 12:18 PM  LOS: 0 days

## 2019-05-17 NOTE — Progress Notes (Signed)
PT Cancellation Note  Patient Details Name: Hannah Hawkins MRN: [id number] DOB: [birth year]   Cancelled Treatment:    Reason Eval/Treat Not Completed: Other (comment)(pt is eating lunch, she requested PT attempt later today. Will follow. )  Tamala Ser PT 05/17/2019  Acute Rehabilitation Services Pager [phone] Office [phone]

## 2019-05-17 NOTE — Plan of Care (Signed)
Plan of care reviewed and discussed with the patient. 

## 2019-05-18 DIAGNOSIS — D638 Anemia in other chronic diseases classified elsewhere: Secondary | ICD-10-CM

## 2019-05-18 DIAGNOSIS — G894 Chronic pain syndrome: Secondary | ICD-10-CM | POA: Diagnosis present

## 2019-05-18 DIAGNOSIS — Z87898 Personal history of other specified conditions: Secondary | ICD-10-CM

## 2019-05-18 DIAGNOSIS — F1491 Cocaine use, unspecified, in remission: Secondary | ICD-10-CM

## 2019-05-18 LAB — CBC
HCT: 27.9 % — ABNORMAL LOW (ref 36.0–46.0)
Hemoglobin: 10.5 g/dL — ABNORMAL LOW (ref 12.0–15.0)
MCH: 29.9 pg (ref 26.0–34.0)
MCHC: 37.6 g/dL — ABNORMAL HIGH (ref 30.0–36.0)
MCV: 79.5 fL — ABNORMAL LOW (ref 80.0–100.0)
Platelets: 349 10*3/uL (ref 150–400)
RBC: 3.51 MIL/uL — ABNORMAL LOW (ref 3.87–5.11)
RDW: 14.4 % (ref 11.5–15.5)
WBC: 8.5 10*3/uL (ref 4.0–10.5)
nRBC: 5.5 % — ABNORMAL HIGH (ref 0.0–0.2)

## 2019-05-18 LAB — BASIC METABOLIC PANEL
Anion gap: 5 (ref 5–15)
BUN: 7 mg/dL (ref 6–20)
CO2: 26 mmol/L (ref 22–32)
Calcium: 8.3 mg/dL — ABNORMAL LOW (ref 8.9–10.3)
Chloride: 105 mmol/L (ref 98–111)
Creatinine, Ser: 0.75 mg/dL (ref 0.44–1.00)
GFR calc Af Amer: >60 mL/min (ref >60)
GFR calc non Af Amer: >60 mL/min (ref >60)
Glucose, Bld: 85 mg/dL (ref 70–99)
Potassium: 3.7 mmol/L (ref 3.5–5.1)
Sodium: 136 mmol/L (ref 135–145)

## 2019-05-18 MED ORDER — HYDROXYUREA 500 MG PO CAPS
1000.0000 mg | ORAL_CAPSULE | Freq: Every day | ORAL | Status: DC
  Administered 2019-05-18: 1000 mg via ORAL
  Filled 2019-05-18: qty 2

## 2019-05-18 MED ORDER — LORATADINE 10 MG PO TABS
10.0000 mg | ORAL_TABLET | Freq: Every day | ORAL | Status: DC
  Administered 2019-05-18: 10:00:00 10 mg via ORAL
  Filled 2019-05-18: qty 1

## 2019-05-18 MED ORDER — PANTOPRAZOLE SODIUM 40 MG PO TBEC
40.0000 mg | DELAYED_RELEASE_TABLET | Freq: Every day | ORAL | Status: DC
  Administered 2019-05-18: 40 mg via ORAL

## 2019-05-18 MED ORDER — ADULT MULTIVITAMIN W/MINERALS CH
1.0000 | ORAL_TABLET | Freq: Every day | ORAL | Status: DC
  Administered 2019-05-18: 1 via ORAL
  Filled 2019-05-18: qty 1

## 2019-05-18 MED ORDER — PROMETHAZINE HCL 12.5 MG PO TABS: 12.5000 mg | ORAL_TABLET | ORAL | 0 refills | Status: DC | PRN

## 2019-05-18 NOTE — Progress Notes (Signed)
Patient has discharged to home on 05/18/2019. Discharge instruction including medication and appointment was given to patient. Patient has no question at this time.

## 2019-05-18 NOTE — Discharge Instructions (Signed)
°Resume all home medications.  °Follow up with PCP as previously  scheduled.  ° °Discussed the importance of drinking 64 ounces of water daily to  help prevent pain crises, it is important to drink plenty of water throughout the day. This is because dehydration of red blood cells may lead further sickling.  ° °Avoid all stressors that precipitate sickle cell pain crisis.   ° ° °The patient was given clear instructions to go to ER or return to medical center if symptoms do not improve, worsen or new problems develop.  ° ° ° °Sickle Cell Anemia, Adult °Sickle cell anemia is a condition where your red blood cells are shaped like sickles. Red blood cells carry oxygen through the body. Sickle-shaped cells do not live as long as normal red blood cells. They also clump together and block blood from flowing through the blood vessels. This prevents the body from getting enough oxygen. Sickle cell anemia causes organ damage and pain. It also increases the risk of infection. °Follow these instructions at home: °Medicines °· Take over-the-counter and prescription medicines only as told by your doctor. °· If you were prescribed an antibiotic medicine, take it as told by your doctor. Do not stop taking the antibiotic even if you start to feel better. °· If you develop a fever, do not take medicines to lower the fever right away. Tell your doctor about the fever. °Managing pain, stiffness, and swelling °· Try these methods to help with pain: °? Use a heating pad. °? Take a warm bath. °? Distract yourself, such as by watching TV. °Eating and drinking °· Drink enough fluid to keep your pee (urine) clear or pale yellow. Drink more in hot weather and during exercise. °· Limit or avoid alcohol. °· Eat a healthy diet. Eat plenty of fruits, vegetables, whole grains, and lean protein. °· Take vitamins and supplements as told by your doctor. °Traveling °· When traveling, keep these with you: °? Your medical information. °? The names of  your doctors. °? Your medicines. °· If you need to take an airplane, talk to your doctor first. °Activity °· Rest often. °· Avoid exercises that make your heart beat much faster, such as jogging. °General instructions °· Do not use products that have nicotine or tobacco, such as cigarettes and e-cigarettes. If you need help quitting, ask your doctor. °· Consider wearing a medical alert bracelet. °· Avoid being in high places (high altitudes), such as mountains. °· Avoid very hot or cold temperatures. °· Avoid places where the temperature changes a lot. °· Keep all follow-up visits as told by your doctor. This is important. °Contact a doctor if: °· A joint hurts. °· Your feet or hands hurt or swell. °· You feel tired (fatigued). °Get help right away if: °· You have symptoms of infection. These include: °? Fever. °? Chills. °? Being very tired. °? Irritability. °? Poor eating. °? Throwing up (vomiting). °· You feel dizzy or faint. °· You have new stomach pain, especially on the left side. °· You have a an erection (priapism) that lasts more than 4 hours. °· You have numbness in your arms or legs. °· You have a hard time moving your arms or legs. °· You have trouble talking. °· You have pain that does not go away when you take medicine. °· You are short of breath. °· You are breathing fast. °· You have a long-term cough. °· You have pain in your chest. °· You have a bad headache. °·   You have a stiff neck. °· Your stomach looks bloated even though you did not eat much. °· Your skin is pale. °· You suddenly cannot see well. °Summary °· Sickle cell anemia is a condition where your red blood cells are shaped like sickles. °· Follow your doctor's advice on ways to manage pain, food to eat, activities to do, and steps to take for safe travel. °· Get medical help right away if you have any signs of infection, such as a fever. °This information is not intended to replace advice given to you by your health care provider. Make  sure you discuss any questions you have with your health care provider. °Document Released: 09/26/2013 Document Revised: 01/11/2017 Document Reviewed: 01/11/2017 °Elsevier Interactive Patient Education © 2019 [employer] ° °

## 2019-05-18 NOTE — Discharge Summary (Signed)
Physician Discharge Summary  Laritza Vokes TIW:[id number] DOB: 09-Oct-1986 DOA: 05/15/2019  PCP: Lanae Boast, FNP  Admit date: 05/15/2019  Discharge date: 05/18/2019  Discharge Diagnoses:  Active Problems:   Sickle cell anemia with crisis ([place])   Sickle cell pain crisis ([place])   Sickle cell crisis [facility])   Discharge Condition: Stable  Disposition:  Follow-up Information    Lanae Boast, FNP Follow up in 1 week(s).   Specialty:  Family Medicine Why:  Repeat CMP and CBC Contact information: [place] [address] [place] [address] [phone]          Pt is discharged home in good condition and is to follow up with Lanae Boast, FNP in 1 week to have labs evaluated. Jaysa Kise is instructed to increase activity slowly and balance with rest for the next few days, and use prescribed medication to complete treatment of pain  Diet: Regular Wt Readings from Last 3 Encounters:  05/15/19 63.5 kg  05/10/19 64 kg  04/19/19 63.5 kg    History of present illness:  Angele Wiemann, a 33 year old female with a medical history significant for sickle cell disease, chronic pain syndrome, opiate dependence, and history cocaine use presented to the emergency department complaining of bilateral shoulder and lower extremity pain that was consistent with typical sickle cell pain crisis.  Patient had been taking home medications several days prior to admission without sustained relief.  Patient denies fever, chills, cough, chest pains, heart palpitations, N/V, diarrhea, dysuria, or hematuria.  ER course: Sodium 138, potassium 4.0, BUN 10, creatinine 0.75, AST 20, ALT 14.  WBC 6.2, hemoglobin 10.8, and platelets 346.  Size negative.  Left shoulder x-ray unremarkable.  Pain persists despite IV Dilaudid, IV fluids, and IV Toradol.  Patient admitted to [facility] for sickle cell pain crisis.  Hospital Course:  Sickle cell disease with pain crisis:  Patient was admitted for sickle cell  pain crisis and managed appropriately with IVF, IV Dilaudid via PCA and IV Toradol, as well as other adjunct therapies per sickle cell pain management protocols.  Patient has poor venous access and refused a second peripheral IV.  Patient was transitioned to oral home medications and Dilaudid SQ 2 mg every 4 hours as needed.  Pain intensity improved to 6/10.  Hemoglobin remained stable and consistent with baseline throughout admission. Patient typically experiences nausea/vomiting with IV pain medications, resolved with antiemetics.  Patient tolerated diet well.  Promethazine 12.5 mg every 4 hours as needed for nausea and vomiting was sent to pharmacy.  Also, contacted patient's PCP to discuss pain medication regimen.  OxyContin 10 mg every 12 hours #60 has been sent to patient's pharmacy. Patient advised to resume all home medications. Patient will follow-up with PCP in 1 week to repeat CBC and CMP.  Also, possible adhesive capsulitis to left shoulder, she warrants outpatient follow-up with orthopedic services.  Patient was discharged home today in a hemodynamically stable condition.   Discharge Exam: Vitals:   05/18/19 0746 05/18/19 1158  BP: 113/77 91/63  Pulse: 84 71  Resp: 20 20  Temp: 98.7 F (37.1 C) 98.5 F (36.9 C)  SpO2: 95% 100%   Vitals:   05/18/19 0041 05/18/19 0428 05/18/19 0746 05/18/19 1158  BP: 111/75 106/69 113/77 91/63  Pulse: 66 70 84 71  Resp: '16 16 20 20  ' Temp: 98.5 F (36.9 C) 98.3 F (36.8 C) 98.7 F (37.1 C) 98.5 F (36.9 C)  TempSrc: Oral Oral Oral Oral  SpO2: 98%  99% 95% 100%  Weight:      Height:        General appearance : Awake, alert, not in any distress. Speech Clear. Not toxic looking HEENT: Atraumatic and Normocephalic, pupils equally reactive to light and accomodation Neck: Supple, no JVD. No cervical lymphadenopathy.  Chest: Good air entry bilaterally, no added sounds  CVS: S1 S2 regular, no murmurs.  Abdomen: Bowel sounds present, Non tender  and not distended with no gaurding, rigidity or rebound. Extremities: B/L Lower Ext shows no edema, both legs are warm to touch Neurology: Awake alert, and oriented X 3, CN II-XII intact, Non focal Skin: No Rash  Discharge Instructions  Discharge Instructions    Discharge patient   Complete by:  As directed    Discharge disposition:  01-Home or Self Care   Discharge patient date:  05/18/2019     Allergies as of 05/18/2019      Reactions   Penicillins Swelling, Rash   Has patient had a PCN reaction causing immediate rash, facial/tongue/throat swelling, SOB or lightheadedness with hypotension: Yes Has patient had a PCN reaction causing severe rash involving mucus membranes or skin necrosis: Yes Has patient had a PCN reaction that required hospitalization No Has patient had a PCN reaction occurring within the last 10 years: no If all of the above answers are "NO", then may proceed with Cephalosporin use.      Medication List    TAKE these medications   baclofen 10 MG tablet Commonly known as:  LIORESAL TAKE 1 TABLET(10 MG) BY MOUTH THREE TIMES DAILY AS NEEDED FOR MUSCLE SPASMS What changed:  See the new instructions.   chlorhexidine 0.12 % solution Commonly known as:  Peridex Use as directed 10 mLs in the mouth or throat 2 (two) times daily for 10 days.   Culturelle Caps Take 1 capsule by mouth daily. Take 1 cap po BID x 7 days and then decrease to once a day. What changed:    when to take this  additional instructions   ergocalciferol 1.25 MG (50000 UT) capsule Commonly known as:  VITAMIN D2 Take 1 capsule (50,000 Units total) by mouth once a week.   hydroxyurea 500 MG capsule Commonly known as:  HYDREA TAKE 2 CAPSULES(1000 MG) BY MOUTH DAILY What changed:  See the new instructions.   ibuprofen 800 MG tablet Commonly known as:  ADVIL TAKE 1 TABLET(800 MG) BY MOUTH EVERY 8 HOURS AS NEEDED FOR MODERATE PAIN What changed:  See the new instructions.   levocetirizine  5 MG tablet Commonly known as:  XYZAL TAKE 1 TABLET(5 MG) BY MOUTH EVERY EVENING What changed:    how much to take  how to take this  when to take this  additional instructions   multivitamin with minerals Tabs tablet Take 1 tablet by mouth daily.   Narcan 4 MG/0.1ML Liqd nasal spray kit Generic drug:  naloxone Place 4 mg into the nose once as needed (overdose).   ondansetron 8 MG tablet Commonly known as:  Zofran Take 1 tablet (8 mg total) by mouth every 8 (eight) hours as needed for nausea or vomiting.   oxyCODONE 10 mg 12 hr tablet Commonly known as:  OxyCONTIN Take 1 tablet (10 mg total) by mouth every 12 (twelve) hours for 15 days.   oxyCODONE 15 MG immediate release tablet Commonly known as:  ROXICODONE Take 1 tablet (15 mg total) by mouth every 6 (six) hours as needed for up to 15 days for pain.  pantoprazole 40 MG tablet Commonly known as:  PROTONIX Take 1 tablet (40 mg total) by mouth daily.   promethazine 12.5 MG tablet Commonly known as:  PHENERGAN Take 1 tablet (12.5 mg total) by mouth every 4 (four) hours as needed for nausea or vomiting.       The results of significant diagnostics from this hospitalization (including imaging, microbiology, ancillary and laboratory) are listed below for reference.    Significant Diagnostic Studies: Dg Shoulder Left  Result Date: 05/15/2019 CLINICAL DATA:  Diffuse left shoulder pain for the past 3 days. History of sickle cell disease. EXAM: LEFT SHOULDER - 2+ VIEW COMPARISON:  None. FINDINGS: There is no evidence of fracture or dislocation. There is no evidence of arthropathy or other focal bone abnormality. No sclerosis to suggest avascular necrosis. Soft tissues are unremarkable. IMPRESSION: Negative. Electronically Signed   By: Titus Dubin M.D.   On: 05/15/2019 15:05    Microbiology: Recent Results (from the past 240 hour(s))  SARS Coronavirus 2 (CEPHEID - Performed in [place] hospital lab), Hosp Order      Status: None   Collection Time: 05/15/19  6:26 PM  Result Value Ref Range Status   SARS Coronavirus 2 NEGATIVE NEGATIVE Final    Comment: (NOTE) If result is NEGATIVE SARS-CoV-2 target nucleic acids are NOT DETECTED. The SARS-CoV-2 RNA is generally detectable in upper and lower  respiratory specimens during the acute phase of infection. The lowest  concentration of SARS-CoV-2 viral copies this assay can detect is 250  copies / mL. A negative result does not preclude SARS-CoV-2 infection  and should not be used as the sole basis for treatment or other  patient management decisions.  A negative result may occur with  improper specimen collection / handling, submission of specimen other  than nasopharyngeal swab, presence of viral mutation(s) within the  areas targeted by this assay, and inadequate number of viral copies  (<250 copies / mL). A negative result must be combined with clinical  observations, patient history, and epidemiological information. If result is POSITIVE SARS-CoV-2 target nucleic acids are DETECTED. The SARS-CoV-2 RNA is generally detectable in upper and lower  respiratory specimens dur ing the acute phase of infection.  Positive  results are indicative of active infection with SARS-CoV-2.  Clinical  correlation with patient history and other diagnostic information is  necessary to determine patient infection status.  Positive results do  not rule out bacterial infection or co-infection with other viruses. If result is PRESUMPTIVE POSTIVE SARS-CoV-2 nucleic acids MAY BE PRESENT.   A presumptive positive result was obtained on the submitted specimen  and confirmed on repeat testing.  While 2019 novel coronavirus  (SARS-CoV-2) nucleic acids may be present in the submitted sample  additional confirmatory testing may be necessary for epidemiological  and / or clinical management purposes  to differentiate between  SARS-CoV-2 and other Sarbecovirus currently known to  infect humans.  If clinically indicated additional testing with an alternate test  methodology [phone]) is advised. The SARS-CoV-2 RNA is generally  detectable in upper and lower respiratory sp ecimens during the acute  phase of infection. The expected result is Negative. Fact Sheet for Patients:  StrictlyIdeas.no Fact Sheet for Healthcare Providers: BankingDealers.co.za This test is not yet approved or cleared by the [place] FDA and has been authorized for detection and/or diagnosis of SARS-CoV-2 by FDA under an Emergency Use Authorization (EUA).  This EUA will remain in effect (meaning this test can be used) for the duration  of the COVID-19 declaration under Section 564(b)(1) of the Act, 21 U.S.C. section 360bbb-3(b)(1), unless the authorization is terminated or revoked sooner. Performed at [facility], [place] [address], [place], [place] [address]      Labs: Basic Metabolic Panel: Recent Labs  Lab 05/15/19 1231 05/16/19 0613 05/18/19 0651  NA 138 136 136  K 4.0 3.3* 3.7  CL 105 101 105  CO2 '26 25 26  ' GLUCOSE 95 112* 85  BUN '10 6 7  ' CREATININE 0.75 0.72 0.75  CALCIUM 8.5* 8.8* 8.3*   Liver Function Tests: Recent Labs  Lab 05/15/19 1231 05/16/19 0613  AST 20 27  ALT 14 16  ALKPHOS 44 48  BILITOT 0.6 1.2  PROT 6.6 7.4  ALBUMIN 3.6 4.1   No results for input(s): LIPASE, AMYLASE in the last 168 hours. No results for input(s): AMMONIA in the last 168 hours. CBC: Recent Labs  Lab 05/15/19 1231 05/16/19 0613 05/18/19 0651  WBC 6.2 10.1 8.5  NEUTROABS 2.7  --   --   HGB 10.8* 12.0 10.5*  HCT 29.4* 32.6* 27.9*  MCV 79.0* 80.3 79.5*  PLT 346 284 349   Cardiac Enzymes: No results for input(s): CKTOTAL, CKMB, CKMBINDEX, TROPONINI in the last 168 hours. BNP: Invalid input(s): POCBNP CBG: No results for input(s): GLUCAP in the last 168 hours.  Time coordinating discharge: 50  minutes  Signed:  Donia Pounds  APRN, MSN, FNP-C [facility] [place], [place] [address] [phone]  [facility] 05/18/2019, 3:10 PM

## 2019-05-22 ENCOUNTER — Encounter: Payer: Medicaid Other | Admitting: Neurology

## 2019-05-22 ENCOUNTER — Other Ambulatory Visit: Payer: Self-pay | Admitting: Family Medicine

## 2019-05-22 DIAGNOSIS — D5720 Sickle-cell/Hb-C disease without crisis: Secondary | ICD-10-CM

## 2019-05-23 ENCOUNTER — Other Ambulatory Visit: Payer: Self-pay

## 2019-05-23 DIAGNOSIS — D5720 Sickle-cell/Hb-C disease without crisis: Secondary | ICD-10-CM

## 2019-05-23 NOTE — Telephone Encounter (Signed)
Refill request

## 2019-05-23 NOTE — Telephone Encounter (Signed)
Hannah Hawkins,  Please advise on refills

## 2019-05-24 ENCOUNTER — Telehealth: Payer: Self-pay | Admitting: Family Medicine

## 2019-05-24 NOTE — Telephone Encounter (Signed)
Called patient due to denial of medications. She sent in a mychart message to see the reason for denial. I explained to the patient that her blood drug screen came back positive for cocaine and she will not receive any more narcotics from this provider. She reports that she was around cocaine and it must have gotten on her urine. Advised patient that the urine sample that was left in the office was cold and negative for cocaine. She denies cocaine use and is upset about this positive result. Offered patient to come in next week for another blood test. She has been instructed to continue with ibuprofen, hydration and rest.

## 2019-05-28 ENCOUNTER — Inpatient Hospital Stay (HOSPITAL_COMMUNITY)
Admission: EM | Admit: 2019-05-28 | Discharge: 2019-05-31 | DRG: 812 | Disposition: A | Discharge status: Home / Self Care | Payer: Medicaid Other | Attending: Internal Medicine | Admitting: Internal Medicine

## 2019-05-28 ENCOUNTER — Telehealth: Payer: Self-pay

## 2019-05-28 ENCOUNTER — Telehealth (HOSPITAL_COMMUNITY): Payer: Self-pay | Admitting: General Practice

## 2019-05-28 ENCOUNTER — Other Ambulatory Visit: Payer: Self-pay

## 2019-05-28 ENCOUNTER — Encounter (HOSPITAL_COMMUNITY): Payer: Self-pay | Admitting: Emergency Medicine

## 2019-05-28 DIAGNOSIS — G894 Chronic pain syndrome: Secondary | ICD-10-CM | POA: Diagnosis present

## 2019-05-28 DIAGNOSIS — Z880 Allergy status to penicillin: Secondary | ICD-10-CM

## 2019-05-28 DIAGNOSIS — Z87891 Personal history of nicotine dependence: Secondary | ICD-10-CM

## 2019-05-28 DIAGNOSIS — F1120 Opioid dependence, uncomplicated: Secondary | ICD-10-CM | POA: Diagnosis present

## 2019-05-28 DIAGNOSIS — D5700 Hb-SS disease with crisis, unspecified: Principal | ICD-10-CM | POA: Diagnosis present

## 2019-05-28 DIAGNOSIS — Z79899 Other long term (current) drug therapy: Secondary | ICD-10-CM

## 2019-05-28 DIAGNOSIS — F1490 Cocaine use, unspecified, uncomplicated: Secondary | ICD-10-CM | POA: Diagnosis present

## 2019-05-28 DIAGNOSIS — Z1159 Encounter for screening for other viral diseases: Secondary | ICD-10-CM

## 2019-05-28 DIAGNOSIS — K219 Gastro-esophageal reflux disease without esophagitis: Secondary | ICD-10-CM | POA: Diagnosis present

## 2019-05-28 LAB — CBC WITH DIFFERENTIAL/PLATELET
Abs Immature Granulocytes: 0.02 10*3/uL (ref 0.00–0.07)
Basophils Absolute: 0.1 10*3/uL (ref 0.0–0.1)
Basophils Relative: 1 %
Eosinophils Absolute: 0.2 10*3/uL (ref 0.0–0.5)
Eosinophils Relative: 2 %
HCT: 31.7 % — ABNORMAL LOW (ref 36.0–46.0)
Hemoglobin: 11.6 g/dL — ABNORMAL LOW (ref 12.0–15.0)
Immature Granulocytes: 0 %
Lymphocytes Relative: 29 %
Lymphs Abs: 2.7 10*3/uL (ref 0.7–4.0)
MCH: 29.3 pg (ref 26.0–34.0)
MCHC: 36.6 g/dL — ABNORMAL HIGH (ref 30.0–36.0)
MCV: 80.1 fL (ref 80.0–100.0)
Monocytes Absolute: 0.7 10*3/uL (ref 0.1–1.0)
Monocytes Relative: 8 %
Neutro Abs: 5.5 10*3/uL (ref 1.7–7.7)
Neutrophils Relative %: 60 %
Platelets: 333 10*3/uL (ref 150–400)
RBC: 3.96 MIL/uL (ref 3.87–5.11)
RDW: 14.1 % (ref 11.5–15.5)
WBC: 9.2 10*3/uL (ref 4.0–10.5)
nRBC: 1.4 % — ABNORMAL HIGH (ref 0.0–0.2)

## 2019-05-28 LAB — COMPREHENSIVE METABOLIC PANEL
ALT: 17 U/L (ref 0–44)
AST: 30 U/L (ref 15–41)
Albumin: 4 g/dL (ref 3.5–5.0)
Alkaline Phosphatase: 45 U/L (ref 38–126)
Anion gap: 6 (ref 5–15)
BUN: 10 mg/dL (ref 6–20)
CO2: 25 mmol/L (ref 22–32)
Calcium: 8.4 mg/dL — ABNORMAL LOW (ref 8.9–10.3)
Chloride: 106 mmol/L (ref 98–111)
Creatinine, Ser: 0.71 mg/dL (ref 0.44–1.00)
GFR calc Af Amer: >60 mL/min (ref >60)
GFR calc non Af Amer: >60 mL/min (ref >60)
Glucose, Bld: 116 mg/dL — ABNORMAL HIGH (ref 70–99)
Potassium: 4.4 mmol/L (ref 3.5–5.1)
Sodium: 137 mmol/L (ref 135–145)
Total Bilirubin: 0.3 mg/dL (ref 0.3–1.2)
Total Protein: 7.1 g/dL (ref 6.5–8.1)

## 2019-05-28 LAB — I-STAT BETA HCG BLOOD, ED (MC, WL, AP ONLY): I-stat hCG, quantitative: <5 m[IU]/mL (ref <5)

## 2019-05-28 LAB — RETICULOCYTES
Immature Retic Fract: 32 % — ABNORMAL HIGH (ref 2.3–15.9)
RBC.: 3.96 MIL/uL (ref 3.87–5.11)
Retic Count, Absolute: 118.4 10*3/uL (ref 19.0–186.0)
Retic Ct Pct: 3 % (ref 0.4–3.1)

## 2019-05-28 MED ORDER — DIPHENHYDRAMINE HCL 25 MG PO CAPS
25.0000 mg | ORAL_CAPSULE | ORAL | Status: DC | PRN
  Administered 2019-05-30 (×2): 50 mg via ORAL
  Filled 2019-05-28 (×2): qty 2

## 2019-05-28 MED ORDER — SODIUM CHLORIDE 0.9% FLUSH
3.0000 mL | Freq: Once | INTRAVENOUS | Status: AC
  Administered 2019-05-28: 3 mL via INTRAVENOUS

## 2019-05-28 MED ORDER — HYDROMORPHONE HCL 2 MG/ML IJ SOLN: 2.0000 mg | INTRAMUSCULAR | Status: AC

## 2019-05-28 MED ORDER — HYDROMORPHONE HCL 2 MG/ML IJ SOLN
2.0000 mg | INTRAMUSCULAR | Status: AC
  Administered 2019-05-29: 2 mg via INTRAVENOUS
  Filled 2019-05-28: qty 1

## 2019-05-28 MED ORDER — HYDROMORPHONE HCL 1 MG/ML IJ SOLN
0.5000 mg | Freq: Once | INTRAMUSCULAR | Status: AC
  Administered 2019-05-28: 0.5 mg via SUBCUTANEOUS
  Filled 2019-05-28: qty 1

## 2019-05-28 MED ORDER — ONDANSETRON HCL 4 MG/2ML IJ SOLN
4.0000 mg | INTRAMUSCULAR | Status: DC | PRN
  Administered 2019-05-28 – 2019-05-29 (×3): 4 mg via INTRAVENOUS
  Filled 2019-05-28 (×2): qty 2

## 2019-05-28 MED ORDER — HYDROMORPHONE HCL 2 MG/ML IJ SOLN
2.0000 mg | INTRAMUSCULAR | Status: AC
  Administered 2019-05-28: 2 mg via INTRAVENOUS
  Filled 2019-05-28: qty 1

## 2019-05-28 MED ORDER — KETOROLAC TROMETHAMINE 30 MG/ML IJ SOLN
30.0000 mg | INTRAMUSCULAR | Status: AC
  Administered 2019-05-28: 30 mg via INTRAVENOUS
  Filled 2019-05-28: qty 1

## 2019-05-28 MED ORDER — SODIUM CHLORIDE 0.45 % IV SOLN
INTRAVENOUS | Status: DC
  Administered 2019-05-28: 23:00:00 via INTRAVENOUS

## 2019-05-28 NOTE — Telephone Encounter (Signed)
Patient called and is asking when she can come in to do a drug screening. Please advise. Thanks!

## 2019-05-28 NOTE — ED Provider Notes (Signed)
[facility] DEPT Provider Note   CSN: [id number] Arrival date & time: 05/28/19  1830  History   Chief Complaint Chief Complaint  Patient presents with   Sickle Cell Pain Crisis   Leg Pain    HPI Hannah Hawkins is a 33 y.o. female w/ a hx of sickle cell anemia & chronic pain syndrome who presents to the ED via EMS w/ complaints of sickle cell pain crisis that began this AM when she woke up. Pain is located to the diffuse BLEs, constant, worse with movement, no alleviating factors. Tried ibuprofen without relief. She is running low on her ER oxycodone w/ 1 tablet left so has not had this today and is out of her IR oxycodone. Her doctor that prescribes these medicines plans to discuss need for refills with her in 2 days when returns to office. Patient states pain is typical of her sickle cell pain. Denies fever, chills, cough, dyspnea, or chest pain. Denies traumatic injury, color change, numbness, weakness, or leg swelling. She received 100 mcg of fentanyl en route by EMS w/o much change. Received 0.5 mg of dilaudid upon arrival w/o much change as well.      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      Home Medications    Prior to Admission medications   Medication Sig Start Date End Date Taking? Authorizing Provider  baclofen (LIORESAL) 10 MG tablet TAKE 1 TABLET(10 MG) BY MOUTH THREE  TIMES DAILY AS NEEDED FOR MUSCLE SPASMS Patient taking differently: Take 10 mg by mouth 3 (three) times daily as needed for muscle spasms.  05/07/19  Yes Lanae Boast, FNP  ergocalciferol (VITAMIN D2) 1.25 MG (50000 UT) capsule Take 1 capsule (50,000 Units total) by mouth once a week. 05/10/19  Yes Lanae Boast, FNP  hydroxyurea (HYDREA) 500 MG capsule TAKE 2 CAPSULES(1000 MG) BY MOUTH DAILY Patient taking differently: Take 1,000 mg by mouth daily.  05/07/19  Yes Lanae Boast, FNP  ibuprofen (ADVIL) 800 MG tablet TAKE 1 TABLET(800 MG) BY MOUTH EVERY 8 HOURS AS NEEDED FOR MODERATE PAIN Patient taking differently: Take 800 mg by mouth every 8 (eight) hours as needed for moderate pain.  04/27/19  Yes Lanae Boast, FNP  levocetirizine (XYZAL) 5 MG tablet TAKE 1 TABLET(5 MG) BY MOUTH EVERY EVENING Patient taking differently: Take 5 mg by mouth daily as needed for allergies.  04/11/19  Yes Lanae Boast, FNP  pantoprazole (PROTONIX) 40 MG tablet Take 1 tablet (40 mg total) by mouth daily. 05/10/19  Yes Lanae Boast, FNP  promethazine (PHENERGAN) 12.5 MG tablet Take 1 tablet (12.5 mg total) by mouth every 4 (four) hours as needed for nausea or vomiting. 05/18/19  Yes Dorena Dew, FNP  Lactobacillus Rhamnosus, GG, (CULTURELLE) CAPS Take 1 capsule by mouth daily. Take 1 cap po BID x 7  days and then decrease to once a day. Patient not taking: Reported on 05/28/2019 04/11/19   Lanae Boast, FNP  [facility] 4 MG/0.1ML LIQD nasal spray kit Place 4 mg into the nose once as needed (overdose).  12/22/17   [provider]  ondansetron (ZOFRAN) 8 MG tablet Take 1 tablet (8 mg total) by mouth every 8 (eight) hours as needed for nausea or vomiting. 04/11/19   Lanae Boast, FNP    Family History Family History  Problem Relation Age of Onset   Diabetes Father     Social History Social History   Tobacco Use   Smoking status: Former Smoker    Types: Cigarettes   Smokeless tobacco: Never Used    Substance Use Topics   Alcohol use: Not Currently   Drug use: No     Allergies   Penicillins   Review of Systems Review of Systems  Constitutional: Negative for chills and fever.  Respiratory: Negative for cough and shortness of breath.   Cardiovascular: Negative for chest pain.  Gastrointestinal: Negative for abdominal pain and vomiting.  Musculoskeletal: Positive for myalgias.  Skin: Negative for color change and rash.  Neurological: Negative for weakness and numbness.  All other systems reviewed and are negative.    Physical Exam Updated Vital Signs BP 120/71 (BP Location: Left Arm)    Pulse 62    Temp 97.7 F (36.5 C) (Oral)    Resp 17    LMP 05/23/2019    SpO2 100%   Physical Exam Vitals signs and nursing note reviewed.  Constitutional:      General: She is not in acute distress.    Appearance: She is well-developed. She is not ill-appearing or toxic-appearing.  HENT:     Head: Normocephalic and atraumatic.  Eyes:     General:        Right eye: No discharge.        Left eye: No discharge.     Conjunctiva/sclera: Conjunctivae normal.  Neck:     Musculoskeletal: Neck supple.  Cardiovascular:     Rate and Rhythm: Normal rate and regular rhythm.     Pulses:          Dorsalis pedis pulses are 2+ on the right side and 2+ on the left side.       Posterior tibial pulses are 2+ on the right side and 2+ on the left side.  Pulmonary:     Effort: Pulmonary effort is normal. No respiratory distress.     Breath sounds: Normal breath sounds. No wheezing, rhonchi or rales.  Abdominal:     General: There is no distension.     Palpations: Abdomen is soft.     Tenderness: There is no abdominal tenderness.  Musculoskeletal:     Comments: Lower extremities: No obvious deformity, appreciable swelling, edema, erythema, ecchymosis, warmth, or open wounds. Patient has intact AROM to bilateral hips, knees, ankles, and all digits. Tender to palpation to diffuse BLEs without  point/focal tenderness. Compartments are soft.   Skin:    General: Skin is warm and dry.     Capillary Refill: Capillary refill takes less than 2 seconds.     Findings: No rash.  Neurological:     Mental Status: She is alert.     Comments: Alert. Clear speech. Sensation grossly intact to bilateral lower extremities. 5/5 strength with plantar/dorsiflexion bilaterally. Patient ambulatory, no foot drop noted.   Psychiatric:        Mood and Affect: Mood normal.  Behavior: Behavior normal.    ED Treatments / Results  Labs (all labs ordered are listed, but only abnormal results are displayed) Labs Reviewed  COMPREHENSIVE METABOLIC PANEL - Abnormal; Notable for the following components:      Result Value   Glucose, Bld 116 (*)    Calcium 8.4 (*)    All other components within normal limits  CBC WITH DIFFERENTIAL/PLATELET - Abnormal; Notable for the following components:   Hemoglobin 11.6 (*)    HCT 31.7 (*)    MCHC 36.6 (*)    nRBC 1.4 (*)    All other components within normal limits  RETICULOCYTES - Abnormal; Notable for the following components:   Immature Retic Fract 32.0 (*)    All other components within normal limits  I-STAT BETA HCG BLOOD, ED ([facility], [facility], [facility] ONLY)    EKG None  Radiology No results found.  Procedures Procedures (including critical care time)  Medications Ordered in ED Medications  sodium chloride flush (NS) 0.9 % injection 3 mL (3 mLs Intravenous Given 05/28/19 2116)  HYDROmorphone (DILAUDID) injection 0.5 mg (0.5 mg Subcutaneous Given 05/28/19 1949)     Initial Impression / Assessment and Plan / ED Course  I have reviewed the triage vital signs and the nursing notes.  Pertinent labs & imaging results that were available during my care of the patient were reviewed by me and considered in my medical decision making (see chart for details).   Patient presents to the ED w/ complaints of bilateral lower extremity pain which she reports is consistent w/  her sickle cell pain crises. Nontoxic appearing, no apparent distress, vitals without significant abnormalities. NO fever, erythema, or warmth, good AROM, do not suspect infectious etiology such as septic joint, cellulitis, or osteomyelitis. Strong symmetric distal pulses, bilateral, acute DVT or arterial thrombosis seems unlikely. No trauma or point/focal bony tenderness to indicate fx/dislocation. Compartments are soft. Patient w/o fever, cough, dyspnea, or chest pain to raise concern for possible acute chest syndrome. Labs appear @ baseline.   Following multiple rounds of pain medications 100 mcg of fentanyl PTA, 3 doses of dilaudid initial w/ decreased dose per triage, patient continues to complain of pain, she does not feel comfortable going home, requesting admission. Will consult hospitalist service.   00:58: CONSULT: Discussed w/ hospitalist Dr. Roel Cluck who accepts admission, Covid swab obtained secondary to admission status.   Sentoria Brent was evaluated in Emergency Department on 05/29/2019 for the symptoms described in the history of present illness. He/she was evaluated in the context of the global COVID-19 pandemic, which necessitated consideration that the patient might be at risk for infection with the SARS-CoV-2 virus that causes COVID-19. Institutional protocols and algorithms that pertain to the evaluation of patients at risk for COVID-19 are in a state of rapid change based on information released by regulatory bodies including the CDC and federal and state organizations. These policies and algorithms were followed during the patient's care in the ED.   Final Clinical Impressions(s) / ED Diagnoses   Final diagnoses:  Sickle cell pain crisis [facility])    ED Discharge Orders    None       Amaryllis Dyke, PA-C 94/07/68 [id number]    Delora Fuel, MD 10/19/58 [phone]

## 2019-05-28 NOTE — ED Notes (Signed)
Pt states she is allergic to the stickers for the heart monitor.

## 2019-05-28 NOTE — Telephone Encounter (Signed)
Patient called, complained of pain in the right leg that she rated as 9/10. Denied chest pain, fever, diarrhea, abdominal pain, nausea/vomitting. Screened negative for Covid-19 symptoms. Admitted to having means of transportation without driving self after treatment. Last took  800 mg of Ibuprofen. Per provider, patients are not admitted to the day hospital after 13:00. Patient called at 15:03. Patient should call back in the morning or to go to the ER if there is a need to. Patient notified, verbalized understanding.

## 2019-05-28 NOTE — ED Triage Notes (Signed)
PER [facility] pt from home for sickle cell pain in legs that started today. Vitals: 128/84, 70HR, 16R, 100% 97.5 temp. 20g in left AC, Fentanyl 100 mcg given enroute.

## 2019-05-29 ENCOUNTER — Encounter (HOSPITAL_COMMUNITY): Payer: Self-pay

## 2019-05-29 ENCOUNTER — Ambulatory Visit: Payer: Medicaid Other | Admitting: Neurology

## 2019-05-29 ENCOUNTER — Encounter: Payer: Medicaid Other | Admitting: Neurology

## 2019-05-29 DIAGNOSIS — Z880 Allergy status to penicillin: Secondary | ICD-10-CM | POA: Diagnosis not present

## 2019-05-29 DIAGNOSIS — D5700 Hb-SS disease with crisis, unspecified: Secondary | ICD-10-CM | POA: Diagnosis present

## 2019-05-29 DIAGNOSIS — K219 Gastro-esophageal reflux disease without esophagitis: Secondary | ICD-10-CM | POA: Diagnosis present

## 2019-05-29 DIAGNOSIS — G894 Chronic pain syndrome: Secondary | ICD-10-CM

## 2019-05-29 DIAGNOSIS — Z1159 Encounter for screening for other viral diseases: Secondary | ICD-10-CM | POA: Diagnosis not present

## 2019-05-29 DIAGNOSIS — Z79899 Other long term (current) drug therapy: Secondary | ICD-10-CM | POA: Diagnosis not present

## 2019-05-29 DIAGNOSIS — F1490 Cocaine use, unspecified, uncomplicated: Secondary | ICD-10-CM | POA: Diagnosis present

## 2019-05-29 DIAGNOSIS — F1120 Opioid dependence, uncomplicated: Secondary | ICD-10-CM | POA: Diagnosis present

## 2019-05-29 DIAGNOSIS — M79604 Pain in right leg: Secondary | ICD-10-CM | POA: Diagnosis present

## 2019-05-29 DIAGNOSIS — Z87891 Personal history of nicotine dependence: Secondary | ICD-10-CM | POA: Diagnosis not present

## 2019-05-29 LAB — RAPID URINE DRUG SCREEN, HOSP PERFORMED
Amphetamines: NOT DETECTED
Barbiturates: NOT DETECTED
Benzodiazepines: NOT DETECTED
Cocaine: NOT DETECTED
Opiates: NOT DETECTED
Tetrahydrocannabinol: NOT DETECTED

## 2019-05-29 LAB — PHOSPHORUS: Phosphorus: 3.7 mg/dL (ref 2.5–4.6)

## 2019-05-29 LAB — SARS CORONAVIRUS 2 BY RT PCR (HOSPITAL ORDER, PERFORMED IN ~~LOC~~ HOSPITAL LAB): SARS Coronavirus 2: NEGATIVE

## 2019-05-29 LAB — MAGNESIUM: Magnesium: 2 mg/dL (ref 1.7–2.4)

## 2019-05-29 MED ORDER — SODIUM CHLORIDE 0.9% FLUSH: 9.0000 mL | INTRAVENOUS | Status: DC | PRN

## 2019-05-29 MED ORDER — FLUTICASONE PROPIONATE 50 MCG/ACT NA SUSP
2.0000 | Freq: Every day | NASAL | Status: DC
  Administered 2019-05-29 – 2019-05-31 (×3): 2 via NASAL

## 2019-05-29 MED ORDER — PANTOPRAZOLE SODIUM 40 MG PO TBEC
40.0000 mg | DELAYED_RELEASE_TABLET | Freq: Every day | ORAL | Status: DC
  Administered 2019-05-29 – 2019-05-31 (×3): 40 mg via ORAL
  Filled 2019-05-29 (×3): qty 1

## 2019-05-29 MED ORDER — ENOXAPARIN SODIUM 40 MG/0.4ML ~~LOC~~ SOLN
40.0000 mg | SUBCUTANEOUS | Status: DC
  Administered 2019-05-29 – 2019-05-31 (×3): 40 mg via SUBCUTANEOUS
  Filled 2019-05-29 (×3): qty 0.4

## 2019-05-29 MED ORDER — DEXTROSE-NACL 5-0.45 % IV SOLN
INTRAVENOUS | Status: AC
  Administered 2019-05-29 (×2): via INTRAVENOUS

## 2019-05-29 MED ORDER — NALOXONE HCL 0.4 MG/ML IJ SOLN: 0.4000 mg | INTRAMUSCULAR | Status: DC | PRN

## 2019-05-29 MED ORDER — SALINE SPRAY 0.65 % NA SOLN
1.0000 | NASAL | Status: DC | PRN
  Filled 2019-05-29 (×2): qty 44

## 2019-05-29 MED ORDER — SENNOSIDES-DOCUSATE SODIUM 8.6-50 MG PO TABS
1.0000 | ORAL_TABLET | Freq: Two times a day (BID) | ORAL | Status: DC
  Administered 2019-05-29 – 2019-05-31 (×5): 1 via ORAL
  Filled 2019-05-29 (×5): qty 1

## 2019-05-29 MED ORDER — HYDROMORPHONE 1 MG/ML IV SOLN
INTRAVENOUS | Status: DC
  Administered 2019-05-29: 2.5 mg via INTRAVENOUS
  Administered 2019-05-29: 5.5 mg via INTRAVENOUS
  Administered 2019-05-29: 30 mg via INTRAVENOUS
  Administered 2019-05-29: 3.5 mg via INTRAVENOUS
  Administered 2019-05-29: 2 mg via INTRAVENOUS
  Administered 2019-05-30: 1.5 mg via INTRAVENOUS
  Administered 2019-05-30: 3.5 mg via INTRAVENOUS
  Filled 2019-05-29: qty 30

## 2019-05-29 MED ORDER — POLYETHYLENE GLYCOL 3350 17 G PO PACK: 17.0000 g | PACK | Freq: Every day | ORAL | Status: DC | PRN

## 2019-05-29 MED ORDER — ONDANSETRON HCL 4 MG/2ML IJ SOLN
4.0000 mg | Freq: Four times a day (QID) | INTRAMUSCULAR | Status: DC | PRN
  Administered 2019-05-29 – 2019-05-31 (×3): 4 mg via INTRAVENOUS
  Filled 2019-05-29 (×4): qty 2

## 2019-05-29 MED ORDER — KETOROLAC TROMETHAMINE 30 MG/ML IJ SOLN
30.0000 mg | Freq: Four times a day (QID) | INTRAMUSCULAR | Status: DC
  Administered 2019-05-29 – 2019-05-31 (×9): 30 mg via INTRAVENOUS
  Filled 2019-05-29 (×10): qty 1

## 2019-05-29 MED ORDER — HYDROXYUREA 500 MG PO CAPS
1000.0000 mg | ORAL_CAPSULE | Freq: Every day | ORAL | Status: DC
  Administered 2019-05-29 – 2019-05-31 (×3): 1000 mg via ORAL
  Filled 2019-05-29 (×3): qty 2

## 2019-05-29 MED ORDER — BISACODYL 10 MG RE SUPP: 10.0000 mg | Freq: Every day | RECTAL | Status: DC | PRN

## 2019-05-29 MED ORDER — ACETAMINOPHEN 325 MG PO TABS
650.0000 mg | ORAL_TABLET | Freq: Four times a day (QID) | ORAL | Status: DC | PRN
  Administered 2019-05-29: 650 mg via ORAL
  Filled 2019-05-29: qty 2

## 2019-05-29 MED ORDER — VITAMIN D (ERGOCALCIFEROL) 1.25 MG (50000 UNIT) PO CAPS: 50000.0000 [IU] | ORAL_CAPSULE | ORAL | Status: DC

## 2019-05-29 MED ORDER — FLUTICASONE PROPIONATE 50 MCG/ACT NA SUSP
1.0000 | Freq: Every day | NASAL | Status: DC
  Filled 2019-05-29: qty 16

## 2019-05-29 NOTE — ED Notes (Signed)
Pt aware that a urine sample is needed.  Pt unable at this time. 

## 2019-05-29 NOTE — H&P (Signed)
Hannah Hawkins GTX:[id number] DOB: 23-Feb-1986 DOA: 05/28/2019     PCP: Lanae Boast, FNP   Outpatient Specialists: NONE    Patient arrived to ER on 05/28/19 at 1830  Patient coming from: home Lives   With family    Chief Complaint:  Chief Complaint  Patient presents with  . Sickle Cell Pain Crisis  . Leg Pain    HPI: Hannah Hawkins is a 33 y.o. female with medical history significant of Sickle cell anemia, GERD,    substance abuse    Presented with right leg pain she try to use ibuprofen but did not seem to help she is running low on her OxyContin and now completely out of her oxycodone states her pain is typical for her sickle cell pain no fevers no chest pain or shortness of breath She called EMS and was brought into [place] She was given fentanyl 100 mcg in route Patient supposed to be home on OxyContin 10 mg twice a day as needed oxycodone 15 mg Is also taking folic acid.  She has had 3 admissions/visits in the past 6 months.  Time patient was admitted on 26 May Patient usually was getting her medications from internal medicine clinic Lanae Boast nurse practitioner but recently her narcotic refills was denied secondary to U tox positive for cocaine  Infectious risk factors:  Reports none  In RAPID COVID TEST Ordered    Regarding pertinent Chronic problems: Sickle cell disease on folic acid     While in ER: Patient was reporting sickle cell pain she was given Dilaudid x3 doses without improvement at which point hospitalist was called for admission  The following Work up has been ordered so far:  Orders Placed This Encounter  Procedures  . SARS Coronavirus 2 (CEPHEID - Performed in [place] hospital lab), [facility]  . Comprehensive metabolic panel  . CBC with Differential  . Reticulocytes  . Urine rapid drug screen (hosp performed)  . Monitor O2 SATs  . Document Actual / Estimated Weight  . Saline Lock IV, Maintain IV access  . If O2 Sat  <94% administer O2 at 2 liters/minute via nasal cannula  . Vital signs with O2 sat, q2hours  . Cardiac monitoring  . Weigh patient in Kg  . Patient may eat/drink  . Recheck patient 30 minutes after analgesic administration and notify provider if pain not controlled.  . Initiate Carrier Fluid Protocol  . Cardiac monitoring  . Consult to hospitalist  ALL PATIENTS BEING ADMITTED/HAVING PROCEDURES NEED COVID-19 SCREENING  . Sickle Cell PCA  . Droplet precaution  . I-Stat beta hCG blood, ED  . Admit to Inpatient (patient's expected length of stay will be greater than 2 midnights or inpatient only procedure)      Following Medications were ordered in ER: Medications  0.45 % sodium chloride infusion ( Intravenous New Bag/Given 05/28/19 2319)  HYDROmorphone (DILAUDID) injection 2 mg (has no administration in time range)    Or  HYDROmorphone (DILAUDID) injection 2 mg (has no administration in time range)  HYDROmorphone (DILAUDID) injection 2 mg (has no administration in time range)    Or  HYDROmorphone (DILAUDID) injection 2 mg (has no administration in time range)  diphenhydrAMINE (BENADRYL) capsule 25-50 mg (has no administration in time range)  ondansetron (ZOFRAN) injection 4 mg (4 mg Intravenous Given 05/28/19 2320)  sodium chloride flush (NS) 0.9 % injection 3 mL (3 mLs Intravenous Given 05/28/19 2116)  HYDROmorphone (DILAUDID) injection 0.5 mg (0.5 mg Subcutaneous Given  05/28/19 1949)  ketorolac (TORADOL) 30 MG/ML injection 30 mg (30 mg Intravenous Given 05/28/19 2321)  HYDROmorphone (DILAUDID) injection 2 mg (2 mg Intravenous Given 05/28/19 2322)    Or  HYDROmorphone (DILAUDID) injection 2 mg ( Subcutaneous See Alternative 05/28/19 2322)  HYDROmorphone (DILAUDID) injection 2 mg (2 mg Intravenous Given 05/29/19 0007)    Or  HYDROmorphone (DILAUDID) injection 2 mg ( Subcutaneous See Alternative 05/29/19 0007)        Consult Orders  (From admission, onward)         Start     Ordered    05/29/19 0033  Consult to hospitalist  ALL PATIENTS BEING ADMITTED/HAVING PROCEDURES NEED COVID-19 SCREENING  Once    Comments:  ALL PATIENTS BEING ADMITTED/HAVING PROCEDURES NEED COVID-19 SCREENING  Provider:  (Not yet assigned)  Question Answer Comment  Place call to: [facility]   Reason for Consult Admit      05/29/19 0032           Significant initial  Findings: Abnormal Labs Reviewed  COMPREHENSIVE METABOLIC PANEL - Abnormal; Notable for the following components:      Result Value   Glucose, Bld 116 (*)    Calcium 8.4 (*)    All other components within normal limits  CBC WITH DIFFERENTIAL/PLATELET - Abnormal; Notable for the following components:   Hemoglobin 11.6 (*)    HCT 31.7 (*)    MCHC 36.6 (*)    nRBC 1.4 (*)    All other components within normal limits  RETICULOCYTES - Abnormal; Notable for the following components:   Immature Retic Fract 32.0 (*)    All other components within normal limits    Otherwise labs showing:    Recent Labs  Lab 05/28/19 2115  NA 137  K 4.4  CO2 25  GLUCOSE 116*  BUN 10  CREATININE 0.71  CALCIUM 8.4*    Cr  stable,  Up from baseline see below Lab Results  Component Value Date   CREATININE 0.71 05/28/2019   CREATININE 0.75 05/18/2019   CREATININE 0.72 05/16/2019    Recent Labs  Lab 05/28/19 2115  AST 30  ALT 17  ALKPHOS 45  BILITOT 0.3  PROT 7.1  ALBUMIN 4.0   Lab Results  Component Value Date   CALCIUM 8.4 (L) 05/28/2019      WBC      Component Value Date/Time   WBC 9.2 05/28/2019 2115   ANC    Component Value Date/Time   NEUTROABS 5.5 05/28/2019 2115   NEUTROABS 2.1 05/10/2019 1222     Plt: Lab Results  Component Value Date   PLT 333 05/28/2019     Lactic Acid, Venous    Component Value Date/Time   LATICACIDVEN 0.84 12/21/2017 1535     COVID-19 Labs     Lab Results  Component Value Date   SARSCOV2NAA NEGATIVE 05/15/2019    HG/HCT  Stable     Component Value Date/Time    HGB 11.6 (L) 05/28/2019 2115   HGB 11.2 05/10/2019 1222   HCT 31.7 (L) 05/28/2019 2115   HCT 32.1 (L) 05/10/2019 1222      UA  not ordered     ECG: not obtained    ED Triage Vitals  Enc Vitals Group     BP 05/28/19 1839 (!) 125/97     Pulse Rate 05/28/19 1839 74     Resp 05/28/19 1839 18     Temp 05/28/19 1839 97.7 F (36.5 C)  Temp Source 05/28/19 1839 Oral     SpO2 05/28/19 1839 100 %     Weight --      Height --      Head Circumference --      Peak Flow --      Pain Score 05/28/19 1838 8     Pain Loc --      Pain Edu? --      Excl. in [place]? --   TMAX(24)@       Latest  Blood pressure 108/60, pulse 77, temperature 97.7 F (36.5 C), temperature source Oral, resp. rate 16, last menstrual period 05/23/2019, SpO2 98 %.     Hospitalist was called for admission for sickle cell pain crisis   Review of Systems:    Pertinent positives include: leg pain  Constitutional:  No weight loss, night sweats, Fevers, chills, fatigue, weight loss  HEENT:  No headaches, Difficulty swallowing,Tooth/dental problems,Sore throat,  No sneezing, itching, ear ache, nasal congestion, post nasal drip,  Cardio-vascular:  No chest pain, Orthopnea, PND, anasarca, dizziness, palpitations.no Bilateral lower extremity swelling  GI:  No heartburn, indigestion, abdominal pain, nausea, vomiting, diarrhea, change in bowel habits, loss of appetite, melena, blood in stool, hematemesis Resp:  no shortness of breath at rest. No dyspnea on exertion, No excess mucus, no productive cough, No non-productive cough, No coughing up of blood.No change in color of mucus.No wheezing. Skin:  no rash or lesions. No jaundice GU:  no dysuria, change in color of urine, no urgency or frequency. No straining to urinate.  No flank pain.  Musculoskeletal:    No decreased range of motion. No back pain.  Psych:  No change in mood or affect. No depression or anxiety. No memory loss.  Neuro: no localizing neurological  complaints, no tingling, no weakness, no double vision, no gait abnormality, no slurred speech, no confusion  All systems reviewed and apart from [place] all are negative  Past Medical History:   Past Medical History:  Diagnosis Date  . Sickle cell anemia (HCC)       Past Surgical History:  Procedure Laterality Date  . EYE SURGERY  03/09/2017   left eye    Social History:  Ambulatory  Independently     reports that she has quit smoking. Her smoking use included cigarettes. She has never used smokeless tobacco. She reports previous alcohol use. She reports that she does not use drugs.   Family History:   Family History  Problem Relation Age of Onset  . Diabetes Father     Allergies: Allergies  Allergen Reactions  . Penicillins Swelling and Rash    Has patient had a PCN reaction causing immediate rash, facial/tongue/throat swelling, SOB or lightheadedness with hypotension: Yes Has patient had a PCN reaction causing severe rash involving mucus membranes or skin necrosis: Yes Has patient had a PCN reaction that required hospitalization No Has patient had a PCN reaction occurring within the last 10 years: no If all of the above answers are "NO", then may proceed with Cephalosporin use.      Prior to Admission medications   Medication Sig Start Date End Date Taking? Authorizing Provider  baclofen (LIORESAL) 10 MG tablet TAKE 1 TABLET(10 MG) BY MOUTH THREE TIMES DAILY AS NEEDED FOR MUSCLE SPASMS Patient taking differently: Take 10 mg by mouth 3 (three) times daily as needed for muscle spasms.  05/07/19  Yes Lanae Boast, FNP  ergocalciferol (VITAMIN D2) 1.25 MG (50000 UT) capsule Take 1 capsule (50,000 Units  total) by mouth once a week. 05/10/19  Yes Lanae Boast, FNP  hydroxyurea (HYDREA) 500 MG capsule TAKE 2 CAPSULES(1000 MG) BY MOUTH DAILY Patient taking differently: Take 1,000 mg by mouth daily.  05/07/19  Yes Lanae Boast, FNP  ibuprofen (ADVIL) 800 MG tablet TAKE 1  TABLET(800 MG) BY MOUTH EVERY 8 HOURS AS NEEDED FOR MODERATE PAIN Patient taking differently: Take 800 mg by mouth every 8 (eight) hours as needed for moderate pain.  04/27/19  Yes Lanae Boast, FNP  levocetirizine (XYZAL) 5 MG tablet TAKE 1 TABLET(5 MG) BY MOUTH EVERY EVENING Patient taking differently: Take 5 mg by mouth daily as needed for allergies.  04/11/19  Yes Lanae Boast, FNP  pantoprazole (PROTONIX) 40 MG tablet Take 1 tablet (40 mg total) by mouth daily. 05/10/19  Yes Lanae Boast, FNP  promethazine (PHENERGAN) 12.5 MG tablet Take 1 tablet (12.5 mg total) by mouth every 4 (four) hours as needed for nausea or vomiting. 05/18/19  Yes Dorena Dew, FNP  Lactobacillus Rhamnosus, GG, (CULTURELLE) CAPS Take 1 capsule by mouth daily. Take 1 cap po BID x 7 days and then decrease to once a day. Patient not taking: Reported on 05/28/2019 04/11/19   Lanae Boast, FNP  [facility] 4 MG/0.1ML LIQD nasal spray kit Place 4 mg into the nose once as needed (overdose).  12/22/17   [provider]  ondansetron (ZOFRAN) 8 MG tablet Take 1 tablet (8 mg total) by mouth every 8 (eight) hours as needed for nausea or vomiting. 04/11/19   Lanae Boast, FNP   Physical Exam: Blood pressure 108/60, pulse 77, temperature 97.7 F (36.5 C), temperature source Oral, resp. rate 16, last menstrual period 05/23/2019, SpO2 98 %. 1. General:  in No  Acute distress    Chronically ill  -appearing 2. Psychological: Alert and   Oriented 3. Head/ENT:     Dry Mucous Membranes                          Head Non traumatic, neck supple                           Poor Dentition 4. SKIN:  decreased Skin turgor,  Skin clean Dry and intact no rash 5. Heart: Regular rate and rhythm no Murmur, no Rub or gallop 6. Lungs:   no wheezes or crackles   7. Abdomen: Soft, non-tender, Non distended  bowel sounds present 8. Lower extremities: no clubbing, cyanosis, no  edema 9. Neurologically Grossly intact, moving all 4 extremities  equally  10. MSK: Normal range of motion   All other LABS:     Recent Labs  Lab 05/28/19 2115  WBC 9.2  NEUTROABS 5.5  HGB 11.6*  HCT 31.7*  MCV 80.1  PLT 333     Recent Labs  Lab 05/28/19 2115  NA 137  K 4.4  CL 106  CO2 25  GLUCOSE 116*  BUN 10  CREATININE 0.71  CALCIUM 8.4*     Recent Labs  Lab 05/28/19 2115  AST 30  ALT 17  ALKPHOS 45  BILITOT 0.3  PROT 7.1  ALBUMIN 4.0       Cultures:    Component Value Date/Time   SDES THROAT 12/21/2017 1205   SPECREQUEST NONE Reflexed from [id number] 12/21/2017 1205   CULT NO GROUP A STREP (S.PYOGENES) ISOLATED 12/21/2017 1205   REPTSTATUS 12/23/2017 FINAL 12/21/2017 1205     Radiological  Exams on Admission: No results found.  Chart has been reviewed   Assessment/Plan     33 y.o. female with medical history significant of Sickle cell anemia, GERD,    substance abuse Admitted for sickle cell pain crisis  Present on Admission: . Sickle cell anemia with crisis ([place]) - - will admit per sickle cell protocol,    control pain,    hydrate with IVF D5 .45% Saline @ 100 mls/hour,    Weight based Dilaudid PCA for opioid tolerant patients.    continue hydroxyurea and folic acid   Transfuse as needed if Hg drops significantly below baseline.    No evidence of acute chest at this time   Sickle cell team to take over management in AM  . Cocaine use - pt denies, check UTox . Chronic pain syndrome -continue OxyContin for now   . GERD (gastroesophageal reflux disease)-  stable     Other plan as per orders.  DVT prophylaxis:   Lovenox     Code Status:  FULL CODE   I had personally discussed CODE STATUS with patient   Family Communication:   Family not at  Bedside    Disposition Plan:      To home once workup is complete and patient is stable                      Consults called: none     Admission status:  ED Disposition    ED Disposition Condition [place]: [place] [[id number]]  Level of Care: Med-Surg [16]  Covid Evaluation: N/A  Diagnosis: Sickle cell crisis [facility]) [[id number]]  Admitting Physician: Toy Baker [[id number]]  Attending Physician: Toy Baker [[id number]]  Estimated length of stay: 3 - 4 days  Certification:: I certify this patient will need inpatient services for at least 2 midnights  PT Class (Do Not Modify): Inpatient [101]  PT Acc Code (Do Not Modify): Private [1]          inpatient     Expect 2 midnight stay secondary to severity of patient's current illness including     hx of    sickle cell disease  .    That are currently affecting medical management.   I expect  patient to be hospitalized for 2 midnights requiring inpatient medical care.  Patient is at high risk for adverse outcome (such as loss of life or disability) if not treated.  Indication for inpatient stay as follows:    severe pain requiring acute inpatient management,     Need for IV apin medications  IV fluids        Level of care     medsurge Droplet precaution  PPE: Used by the provider:   P100  eye Goggles,  Gloves      Neveah Bang 05/29/2019, 1:21 AM    [facility]     after 2 AM please page floor coverage PA If 7AM-7PM, please contact the day team taking care of the patient using [email or web address]

## 2019-05-29 NOTE — Telephone Encounter (Signed)
Patient advised.

## 2019-05-29 NOTE — ED Notes (Signed)
ED TO INPATIENT HANDOFF REPORT  ED Nurse Name and Phone #: [phone]  S Name/Age/Gender Hannah Hawkins 33 y.o. female Room/Bed: WA17/WA17  Code Status   Code Status: Prior  Home/SNF/Other Home Patient oriented to: self, place, time and situation Is this baseline? Yes   Triage Complete: Triage complete  Chief Complaint Sickle Cell Crisis  Triage Note PER [facility] pt from home for sickle cell pain in legs that started today. Vitals: 128/84, 70HR, 16R, 100% 97.5 temp. 20g in left AC, Fentanyl 100 mcg given enroute.    Allergies Allergies  Allergen Reactions  . Penicillins Swelling and Rash    Has patient had a PCN reaction causing immediate rash, facial/tongue/throat swelling, SOB or lightheadedness with hypotension: Yes Has patient had a PCN reaction causing severe rash involving mucus membranes or skin necrosis: Yes Has patient had a PCN reaction that required hospitalization No Has patient had a PCN reaction occurring within the last 10 years: no If all of the above answers are "NO", then may proceed with Cephalosporin use.     Level of Care/Admitting Diagnosis ED Disposition    ED Disposition Condition [place] [facility]: [place] [[id number]]  Level of Care: Med-Surg [16]  Covid Evaluation: N/A  Diagnosis: Sickle cell crisis [facility]) [[id number]]  Admitting Physician: Toy Baker [[id number]]  Attending Physician: Toy Baker [[id number]]  Estimated length of stay: 3 - 4 days  Certification:: I certify this patient will need inpatient services for at least 2 midnights  PT Class (Do Not Modify): Inpatient [101]  PT Acc Code (Do Not Modify): Private [1]       B Medical/Surgery History Past Medical History:  Diagnosis Date  . Sickle cell anemia (HCC)    Past Surgical History:  Procedure Laterality Date  . EYE SURGERY  03/09/2017   left eye     A IV Location/Drains/Wounds Patient Lines/Drains/Airways Status   Active  Line/Drains/Airways    Name:   Placement date:   Placement time:   Site:   Days:   Peripheral IV 05/28/19 Right Antecubital   05/28/19    1817    Antecubital   1          Intake/Output Last 24 hours No intake or output data in the 24 hours ending 05/29/19 0111  Labs/Imaging Results for orders placed or performed during the hospital encounter of 05/28/19 (from the past 48 hour(s))  Comprehensive metabolic panel     Status: Abnormal   Collection Time: 05/28/19  9:15 PM  Result Value Ref Range   Sodium 137 135 - 145 mmol/L   Potassium 4.4 3.5 - 5.1 mmol/L   Chloride 106 98 - 111 mmol/L   CO2 25 22 - 32 mmol/L   Glucose, Bld 116 (H) 70 - 99 mg/dL   BUN 10 6 - 20 mg/dL   Creatinine, Ser 0.71 0.44 - 1.00 mg/dL   Calcium 8.4 (L) 8.9 - 10.3 mg/dL   Total Protein 7.1 6.5 - 8.1 g/dL   Albumin 4.0 3.5 - 5.0 g/dL   AST 30 15 - 41 U/L   ALT 17 0 - 44 U/L   Alkaline Phosphatase 45 38 - 126 U/L   Total Bilirubin 0.3 0.3 - 1.2 mg/dL   GFR calc non Af Amer >60 >60 mL/min   GFR calc Af Amer >60 >60 mL/min   Anion gap 6 5 - 15    Comment: Performed at [facility], [address] [place], [place]  [id number]  CBC with Differential     Status: Abnormal   Collection Time: 05/28/19  9:15 PM  Result Value Ref Range   WBC 9.2 4.0 - 10.5 K/uL    Comment: WHITE COUNT CONFIRMED ON SMEAR   RBC 3.96 3.87 - 5.11 MIL/uL   Hemoglobin 11.6 (L) 12.0 - 15.0 g/dL   HCT 60.431.7 (L) 54.036.0 - 98.146.0 %   MCV 80.1 80.0 - 100.0 fL   MCH 29.3 26.0 - 34.0 pg   MCHC 36.6 (H) 30.0 - 36.0 g/dL   RDW 19.114.1 47.811.5 - 29.515.5 %   Platelets 333 150 - 400 K/uL   nRBC 1.4 (H) 0.0 - 0.2 %   Neutrophils Relative % 60 %   Neutro Abs 5.5 1.7 - 7.7 K/uL   Lymphocytes Relative 29 %   Lymphs Abs 2.7 0.7 - 4.0 K/uL   Monocytes Relative 8 %   Monocytes Absolute 0.7 0.1 - 1.0 K/uL   Eosinophils Relative 2 %   Eosinophils Absolute 0.2 0.0 - 0.5 K/uL   Basophils Relative 1 %   Basophils Absolute 0.1 0.0 - 0.1 K/uL    Immature Granulocytes 0 %   Abs Immature Granulocytes 0.02 0.00 - 0.07 K/uL    Comment: Performed at [facility], [address] [place], [place] [id number]  Reticulocytes     Status: Abnormal   Collection Time: 05/28/19  9:15 PM  Result Value Ref Range   Retic Ct Pct 3.0 0.4 - 3.1 %   RBC. 3.96 3.87 - 5.11 MIL/uL   Retic Count, Absolute 118.4 19.0 - 186.0 K/uL   Immature Retic Fract 32.0 (H) 2.3 - 15.9 %    Comment: Performed at [facility], [address] [place], [place] [address]  I-Stat beta hCG blood, ED     Status: None   Collection Time: 05/28/19  9:27 PM  Result Value Ref Range   I-stat hCG, quantitative <5.0 <5 mIU/mL   Comment 3            Comment:   GEST. AGE      CONC.  (mIU/mL)   <=1 WEEK        5 - 50     2 WEEKS       50 - 500     3 WEEKS       100 - 10,000     4 WEEKS     1,000 - 30,000        FEMALE AND NON-PREGNANT FEMALE:     LESS THAN 5 mIU/mL    No results found.  Pending Labs Unresulted Labs (From admission, onward)    Start     Ordered   05/29/19 0058  SARS Coronavirus 2 (CEPHEID - Performed in [facility] hospital lab), Hosp Order  (Asymptomatic Patients Labs)  Once,   R    Question:  Rule Out  Answer:  Yes   05/29/19 0058   05/29/19 0049  Urine rapid drug screen (hosp performed)  ONCE - STAT,   R     05/29/19 0049   Signed and Held  Magnesium  Tomorrow morning,   R     Signed and Held   Signed and Held  Phosphorus  Tomorrow morning,   R     Signed and Held          Vitals/Pain Today's Vitals   05/28/19 2324 05/28/19 2330 05/29/19 0000 05/29/19 0019  BP: (!) 122/58 116/67 108/60  Pulse: 84 96 77   Resp: 18 17 16    Temp:      TempSrc:      SpO2: 100% 95% 98%   PainSc:    10-Worst pain ever    Isolation Precautions Droplet precaution  Medications Medications  0.45 % sodium chloride infusion ( Intravenous New Bag/Given 05/28/19 2319)  HYDROmorphone (DILAUDID) injection 2 mg (has no  administration in time range)    Or  HYDROmorphone (DILAUDID) injection 2 mg (has no administration in time range)  HYDROmorphone (DILAUDID) injection 2 mg (has no administration in time range)    Or  HYDROmorphone (DILAUDID) injection 2 mg (has no administration in time range)  diphenhydrAMINE (BENADRYL) capsule 25-50 mg (has no administration in time range)  ondansetron (ZOFRAN) injection 4 mg (4 mg Intravenous Given 05/28/19 2320)  sodium chloride flush (NS) 0.9 % injection 3 mL (3 mLs Intravenous Given 05/28/19 2116)  HYDROmorphone (DILAUDID) injection 0.5 mg (0.5 mg Subcutaneous Given 05/28/19 1949)  ketorolac (TORADOL) 30 MG/ML injection 30 mg (30 mg Intravenous Given 05/28/19 2321)  HYDROmorphone (DILAUDID) injection 2 mg (2 mg Intravenous Given 05/28/19 2322)    Or  HYDROmorphone (DILAUDID) injection 2 mg ( Subcutaneous See Alternative 05/28/19 2322)  HYDROmorphone (DILAUDID) injection 2 mg (2 mg Intravenous Given 05/29/19 0007)    Or  HYDROmorphone (DILAUDID) injection 2 mg ( Subcutaneous See Alternative 05/29/19 0007)    Mobility walks Low fall risk   Focused Assessments Sickle cell    R Recommendations: See Admitting Provider Note  Report given to:   Additional Notes: na

## 2019-05-29 NOTE — Telephone Encounter (Signed)
Patient is currently in the hospital. She will need to have a blood screening and not a urine screening. She can come later this week if there is not one performed in the hospital.

## 2019-05-29 NOTE — Progress Notes (Signed)
Subjective: Hannah Hawkins, a 33 year old female with a medical history significant for sickle cell disease, chronic pain syndrome, opiate dependence, history of cocaine use was admitted for sickle cell pain crisis. Patient attributes current pain crisis to increased activity and heat exposure.  Also, she states that she is out home pain medications.  She has mostly been taking ibuprofen and Tylenol for pain control without relief she says that pain is primarily to bilateral lower extremities.  Pain intensity 8/10 characterized as constant and sharp.  Patient is afebrile and maintaining oxygen saturation at 100% on RA  Patient denies headache, chest pain, shortness of breath, dysuria, nausea, vomiting, or diarrhea.   Objective:  Vital signs in last 24 hours:  Vitals:   05/29/19 0415 05/29/19 0514 05/29/19 0651 05/29/19 0835  BP: (!) 131/91  117/69   Pulse: (!) 59  71   Resp: 18 16 18 11   Temp: (!) 97.5 F (36.4 C)  98 F (36.7 C)   TempSrc: Oral  Oral   SpO2: 100% 99% 100% 97%  Weight: 65.1 kg     Height: 5\' 2"  (1.575 m)       Intake/Output from previous day:   Intake/Output Summary (Last 24 hours) at 05/29/2019 0841 Last data filed at 05/29/2019 0600 Gross per 24 hour  Intake 1347.6 ml  Output -  Net 1347.6 ml    Physical Exam: General: Alert, awake, oriented x3, in no acute distress.  HEENT: [place]/AT PEERL, EOMI Neck: Trachea midline,  no masses, no thyromegal,y no JVD, no carotid bruit OROPHARYNX:  Moist, No exudate/ erythema/lesions.  Heart: Regular rate and rhythm, without murmurs, rubs, gallops, PMI non-displaced, no heaves or thrills on palpation.  Lungs: Clear to auscultation, no wheezing or rhonchi noted. No increased vocal fremitus resonant to percussion  Abdomen: Soft, nontender, nondistended, positive bowel sounds, no masses no hepatosplenomegaly noted..  Neuro: No focal neurological deficits noted cranial nerves II through XII grossly intact. DTRs 2+ bilaterally  upper and lower extremities. Strength 5 out of 5 in bilateral upper and lower extremities. Musculoskeletal: No warm swelling or erythema around joints, no spinal tenderness noted. Psychiatric: Patient alert and oriented x3, good insight and cognition, good recent to remote recall. Lymph node survey: No cervical axillary or inguinal lymphadenopathy noted.  Lab Results:  Basic Metabolic Panel:    Component Value Date/Time   NA 137 05/28/2019 2115   NA 139 05/10/2019 1222   K 4.4 05/28/2019 2115   CL 106 05/28/2019 2115   CO2 25 05/28/2019 2115   BUN 10 05/28/2019 2115   BUN 3 (L) 05/10/2019 1222   CREATININE 0.71 05/28/2019 2115   CREATININE 0.69 08/04/2017 1208   GLUCOSE 116 (H) 05/28/2019 2115   CALCIUM 8.4 (L) 05/28/2019 2115   CBC:    Component Value Date/Time   WBC 9.2 05/28/2019 2115   HGB 11.6 (L) 05/28/2019 2115   HGB 11.2 05/10/2019 1222   HCT 31.7 (L) 05/28/2019 2115   HCT 32.1 (L) 05/10/2019 1222   PLT 333 05/28/2019 2115   PLT 360 05/10/2019 1222   MCV 80.1 05/28/2019 2115   MCV 84 05/10/2019 1222   NEUTROABS 5.5 05/28/2019 2115   NEUTROABS 2.1 05/10/2019 1222   LYMPHSABS 2.7 05/28/2019 2115   LYMPHSABS 2.4 05/10/2019 1222   MONOABS 0.7 05/28/2019 2115   EOSABS 0.2 05/28/2019 2115   EOSABS 0.2 05/10/2019 1222   BASOSABS 0.1 05/28/2019 2115   BASOSABS 0.1 05/10/2019 1222    Recent Results (from the past 240  hour(s))  SARS Coronavirus 2 (CEPHEID - Performed in [facility] lab), Hosp Order     Status: None   Collection Time: 05/29/19  2:16 AM  Result Value Ref Range Status   SARS Coronavirus 2 NEGATIVE NEGATIVE Final    Comment: (NOTE) If result is NEGATIVE SARS-CoV-2 target nucleic acids are NOT DETECTED. The SARS-CoV-2 RNA is generally detectable in upper and lower  respiratory specimens during the acute phase of infection. The lowest  concentration of SARS-CoV-2 viral copies this assay can detect is 250  copies / mL. A negative result does  not preclude SARS-CoV-2 infection  and should not be used as the sole basis for treatment or other  patient management decisions.  A negative result may occur with  improper specimen collection / handling, submission of specimen other  than nasopharyngeal swab, presence of viral mutation(s) within the  areas targeted by this assay, and inadequate number of viral copies  (<250 copies / mL). A negative result must be combined with clinical  observations, patient history, and epidemiological information. If result is POSITIVE SARS-CoV-2 target nucleic acids are DETECTED. The SARS-CoV-2 RNA is generally detectable in upper and lower  respiratory specimens dur ing the acute phase of infection.  Positive  results are indicative of active infection with SARS-CoV-2.  Clinical  correlation with patient history and other diagnostic information is  necessary to determine patient infection status.  Positive results do  not rule out bacterial infection or co-infection with other viruses. If result is PRESUMPTIVE POSTIVE SARS-CoV-2 nucleic acids MAY BE PRESENT.   A presumptive positive result was obtained on the submitted specimen  and confirmed on repeat testing.  While 2019 novel coronavirus  (SARS-CoV-2) nucleic acids may be present in the submitted sample  additional confirmatory testing may be necessary for epidemiological  and / or clinical management purposes  to differentiate between  SARS-CoV-2 and other Sarbecovirus currently known to infect humans.  If clinically indicated additional testing with an alternate test  methodology [phone]([id number]) is advised. The SARS-CoV-2 RNA is generally  detectable in upper and lower respiratory sp ecimens during the acute  phase of infection. The expected result is Negative. Fact Sheet for Patients:  BoilerBrush.com.cyhttps://www.fda.gov/media/136312/download Fact Sheet for Healthcare Providers: https://pope.com/https://www.fda.gov/media/136313/download This test is not yet approved or  cleared by the [place] FDA and has been authorized for detection and/or diagnosis of SARS-CoV-2 by FDA under an Emergency Use Authorization (EUA).  This EUA will remain in effect (meaning this test can be used) for the duration of the COVID-19 declaration under Section 564(b)(1) of the Act, 21 U.S.C. section 360bbb-3(b)(1), unless the authorization is terminated or revoked sooner. Performed at [facility], [address] [place], [place] [address]     Studies/Results: No results found.  Medications: Scheduled Meds: . enoxaparin (LOVENOX) injection  40 mg Subcutaneous Q24H  . HYDROmorphone   Intravenous Q4H  . hydroxyurea  1,000 mg Oral Daily  . ketorolac  30 mg Intravenous Q6H  . pantoprazole  40 mg Oral Daily  . senna-docusate  1 tablet Oral BID  . [START ON 06/03/2019] Vitamin D (Ergocalciferol)  50,000 Units Oral Weekly   Continuous Infusions: . dextrose 5 % and 0.45% NaCl 100 mL/hr at 05/29/19 0600   PRN Meds:.bisacodyl, diphenhydrAMINE, naloxone **AND** sodium chloride flush, ondansetron, ondansetron (ZOFRAN) IV, polyethylene glycol  Assessment/Plan: Active Problems:   Sickle cell anemia with crisis (HCC)   Cocaine use   Sickle cell crisis (HCC)   Chronic pain syndrome  GERD (gastroesophageal reflux disease)  Sickle cell with pain crisis: Continue IV fluids, D5 0.45% saline at 100 mL/h Continue IV Dilaudid PCA per weight-based protocol Toradol 15 mg IV every 6 hours Attending oxygen saturation above 90%, 2 L supplemental oxygen as needed  Sickle cell anemia: Hemoglobin stable.  No clinical indication for blood transfusion at this time. Continue hydroxyurea and folic acid  History of chronic pain syndrome: Patient was previously on OxyContin 10 mg every 12 hours and oxycodone 15 mg every 6 hours for pain control.  Patient has been unable to obtain opiate medications from PCP, due positive drug screen.  Will continue OxyContin.  History  of cocaine use: Review UDS, negative across the board, including opiates. Repeat UDS    Code Status: Full Code Family Communication: N/A Disposition Plan: Not yet ready for discharge    Nolon NationsLachina Moore   APRN, MSN, FNP-C [facility] [address]  [place], [place] [address] [phone]  If 5PM-7AM, please contact night-coverage.  05/29/2019, 8:41 AM  LOS: 0 days

## 2019-05-30 LAB — CBC
HCT: 31.2 % — ABNORMAL LOW (ref 36.0–46.0)
Hemoglobin: 11.6 g/dL — ABNORMAL LOW (ref 12.0–15.0)
MCH: 29.4 pg (ref 26.0–34.0)
MCHC: 37.2 g/dL — ABNORMAL HIGH (ref 30.0–36.0)
MCV: 79.2 fL — ABNORMAL LOW (ref 80.0–100.0)
Platelets: 318 10*3/uL (ref 150–400)
RBC: 3.94 MIL/uL (ref 3.87–5.11)
RDW: 13.7 % (ref 11.5–15.5)
WBC: 9.7 10*3/uL (ref 4.0–10.5)
nRBC: 0.8 % — ABNORMAL HIGH (ref 0.0–0.2)

## 2019-05-30 MED ORDER — HYDROMORPHONE 1 MG/ML IV SOLN
INTRAVENOUS | Status: DC
  Administered 2019-05-30: 0.5 mg via INTRAVENOUS
  Administered 2019-05-30: 1.83 mg via INTRAVENOUS
  Administered 2019-05-30: 2.5 mg via INTRAVENOUS
  Administered 2019-05-30: 30 mg via INTRAVENOUS
  Administered 2019-05-30: 2 mg via INTRAVENOUS
  Administered 2019-05-31: 4.5 mg via INTRAVENOUS
  Administered 2019-05-31: 4 mg via INTRAVENOUS
  Administered 2019-05-31: 3 mg via INTRAVENOUS
  Administered 2019-05-31: 1.5 mg via INTRAVENOUS
  Filled 2019-05-30: qty 30

## 2019-05-30 MED ORDER — OXYCODONE HCL 5 MG PO TABS
10.0000 mg | ORAL_TABLET | ORAL | Status: DC | PRN
  Administered 2019-05-30 – 2019-05-31 (×4): 10 mg via ORAL
  Filled 2019-05-30 (×4): qty 2

## 2019-05-30 NOTE — Progress Notes (Signed)
Subjective: Hannah Hawkins, a 33 year old female with a medical history significant for sickle cell disease, chronic pain syndrome, opiate dependence, and history of cocaine use was admitted for sickle cell pain crisis.  Patient states that pain intensity has improved minimally overnight.  She currently rates pain as 7/10 characterized as constant and aching.  Patient has not attempted to ambulate in halls.  Patient afebrile and maintaining oxygen saturation at 100% on RA.  Patient denies headache, chest pain, shortness of breath, dysuria, nausea, vomiting, or diarrhea.  Objective:  Vital signs in last 24 hours:  Vitals:   05/30/19 0315 05/30/19 0634 05/30/19 0725 05/30/19 0944  BP:  122/83  130/80  Pulse:  67  77  Resp: 14 16 16 18   Temp:  97.7 F (36.5 C)  98 F (36.7 C)  TempSrc:  Oral  Oral  SpO2: 100% 100% 100% 100%  Weight:      Height:        Intake/Output from previous day:   Intake/Output Summary (Last 24 hours) at 05/30/2019 1100 Last data filed at 05/30/2019 0928 Gross per 24 hour  Intake 2401.43 ml  Output 3550 ml  Net -1148.57 ml    Physical Exam: General: Alert, awake, oriented x3, in no acute distress.  HEENT: [place]/AT PEERL, EOMI Neck: Trachea midline,  no masses, no thyromegal,y no JVD, no carotid bruit OROPHARYNX:  Moist, No exudate/ erythema/lesions.  Heart: Regular rate and rhythm, without murmurs, rubs, gallops, PMI non-displaced, no heaves or thrills on palpation.  Lungs: Clear to auscultation, no wheezing or rhonchi noted. No increased vocal fremitus resonant to percussion  Abdomen: Soft, nontender, nondistended, positive bowel sounds, no masses no hepatosplenomegaly noted..  Neuro: No focal neurological deficits noted cranial nerves II through XII grossly intact. DTRs 2+ bilaterally upper and lower extremities. Strength 5 out of 5 in bilateral upper and lower extremities. Musculoskeletal: No warm swelling or erythema around joints, no spinal  tenderness noted. Psychiatric: Patient alert and oriented x3, good insight and cognition, good recent to remote recall. Lymph node survey: No cervical axillary or inguinal lymphadenopathy noted.  Lab Results:  Basic Metabolic Panel:    Component Value Date/Time   NA 137 05/28/2019 2115   NA 139 05/10/2019 1222   K 4.4 05/28/2019 2115   CL 106 05/28/2019 2115   CO2 25 05/28/2019 2115   BUN 10 05/28/2019 2115   BUN 3 (L) 05/10/2019 1222   CREATININE 0.71 05/28/2019 2115   CREATININE 0.69 08/04/2017 1208   GLUCOSE 116 (H) 05/28/2019 2115   CALCIUM 8.4 (L) 05/28/2019 2115   CBC:    Component Value Date/Time   WBC 9.7 05/30/2019 0456   HGB 11.6 (L) 05/30/2019 0456   HGB 11.2 05/10/2019 1222   HCT 31.2 (L) 05/30/2019 0456   HCT 32.1 (L) 05/10/2019 1222   PLT 318 05/30/2019 0456   PLT 360 05/10/2019 1222   MCV 79.2 (L) 05/30/2019 0456   MCV 84 05/10/2019 1222   NEUTROABS 5.5 05/28/2019 2115   NEUTROABS 2.1 05/10/2019 1222   LYMPHSABS 2.7 05/28/2019 2115   LYMPHSABS 2.4 05/10/2019 1222   MONOABS 0.7 05/28/2019 2115   EOSABS 0.2 05/28/2019 2115   EOSABS 0.2 05/10/2019 1222   BASOSABS 0.1 05/28/2019 2115   BASOSABS 0.1 05/10/2019 1222    Recent Results (from the past 240 hour(s))  SARS Coronavirus 2 (CEPHEID - Performed in [facility] hospital lab), Hosp Order     Status: None   Collection Time: 05/29/19  2:16 AM  Result Value Ref Range Status   SARS Coronavirus 2 NEGATIVE NEGATIVE Final    Comment: (NOTE) If result is NEGATIVE SARS-CoV-2 target nucleic acids are NOT DETECTED. The SARS-CoV-2 RNA is generally detectable in upper and lower  respiratory specimens during the acute phase of infection. The lowest  concentration of SARS-CoV-2 viral copies this assay can detect is 250  copies / mL. A negative result does not preclude SARS-CoV-2 infection  and should not be used as the sole basis for treatment or other  patient management decisions.  A negative result may occur  with  improper specimen collection / handling, submission of specimen other  than nasopharyngeal swab, presence of viral mutation(s) within the  areas targeted by this assay, and inadequate number of viral copies  (<250 copies / mL). A negative result must be combined with clinical  observations, patient history, and epidemiological information. If result is POSITIVE SARS-CoV-2 target nucleic acids are DETECTED. The SARS-CoV-2 RNA is generally detectable in upper and lower  respiratory specimens dur ing the acute phase of infection.  Positive  results are indicative of active infection with SARS-CoV-2.  Clinical  correlation with patient history and other diagnostic information is  necessary to determine patient infection status.  Positive results do  not rule out bacterial infection or co-infection with other viruses. If result is PRESUMPTIVE POSTIVE SARS-CoV-2 nucleic acids MAY BE PRESENT.   A presumptive positive result was obtained on the submitted specimen  and confirmed on repeat testing.  While 2019 novel coronavirus  (SARS-CoV-2) nucleic acids may be present in the submitted sample  additional confirmatory testing may be necessary for epidemiological  and / or clinical management purposes  to differentiate between  SARS-CoV-2 and other Sarbecovirus currently known to infect humans.  If clinically indicated additional testing with an alternate test  methodology [phone]) is advised. The SARS-CoV-2 RNA is generally  detectable in upper and lower respiratory sp ecimens during the acute  phase of infection. The expected result is Negative. Fact Sheet for Patients:  StrictlyIdeas.no Fact Sheet for Healthcare Providers: BankingDealers.co.za This test is not yet approved or cleared by the [place] FDA and has been authorized for detection and/or diagnosis of SARS-CoV-2 by FDA under an Emergency Use Authorization (EUA).  This EUA  will remain in effect (meaning this test can be used) for the duration of the COVID-19 declaration under Section 564(b)(1) of the Act, 21 U.S.C. section 360bbb-3(b)(1), unless the authorization is terminated or revoked sooner. Performed at [facility], [place] [address] [place], [place] [address]     Studies/Results: No results found.  Medications: Scheduled Meds: . enoxaparin (LOVENOX) injection  40 mg Subcutaneous Q24H  . fluticasone  2 spray Each Nare Daily  . HYDROmorphone   Intravenous Q4H  . hydroxyurea  1,000 mg Oral Daily  . ketorolac  30 mg Intravenous Q6H  . pantoprazole  40 mg Oral Daily  . senna-docusate  1 tablet Oral BID  . [START ON 06/03/2019] Vitamin D (Ergocalciferol)  50,000 Units Oral Weekly   Continuous Infusions: PRN Meds:.acetaminophen, bisacodyl, diphenhydrAMINE, naloxone **AND** sodium chloride flush, ondansetron (ZOFRAN) IV, polyethylene glycol, sodium chloride    Assessment/Plan: Active Problems:   Sickle cell anemia with crisis (HCC)   Cocaine use   Sickle cell crisis (HCC)   Chronic pain syndrome   GERD (gastroesophageal reflux disease)   Sickle cell anemia with pain crisis: Continue IV fluids at [place] IV Dilaudid PCA Toradol 15 mg IV every 6 hours Oxycodone  15 mg every 4 hours as needed for moderate to severe breakthrough pain Maintain oxygen saturation above 90%, 2 L supplemental oxygen as needed  Sickle cell anemia: Hemoglobin stable.  No clinical indication for blood transfusion at this time.  History of chronic pain syndrome: Continue home medications.  History of cocaine use: Serum drug screen pending  Code Status: Full Code Family Communication: N/A Disposition Plan: Not yet ready for discharge  Anelia Carriveau Rennis PettyMoore Levoy Geisen  APRN, MSN, FNP-C [facility] [address]  [place], [place] [address] [phone]  If 7PM-7AM, please contact night-coverage.  05/30/2019,  11:00 AM  LOS: 1 day

## 2019-05-31 MED ORDER — OXYCODONE HCL 15 MG PO TABS: 15.0000 mg | ORAL_TABLET | ORAL | 0 refills | Status: DC | PRN

## 2019-05-31 MED ORDER — BACITRACIN 500 UNIT/GM EX OINT
TOPICAL_OINTMENT | Freq: Two times a day (BID) | CUTANEOUS | Status: DC
  Filled 2019-05-31: qty 14

## 2019-05-31 NOTE — Discharge Summary (Signed)
Physician Discharge Summary  Hannah Hawkins XHF:[id number] DOB: 09-Sep-1986 DOA: 05/28/2019  PCP: Hannah Boast, FNP  Admit date: 05/28/2019  Discharge date: 05/31/2019  Discharge Diagnoses:  Active Problems:   Sickle cell anemia with crisis ([place])   Cocaine use   Sickle cell crisis (H[facility])   Chronic pain syndrome   GERD (gastroesophageal reflux disease)   Discharge Condition: Stable  Disposition:  Follow-up Information    Hannah Boast, FNP Follow up in 2 week(s).   Specialty: Family Medicine Contact information: [place] [address] [phone]          Pt is discharged home in good condition and is to follow up with Hannah Boast, FNP in 2 weeks to have labs evaluated. Hannah Hawkins is instructed to increase activity slowly and balance with rest for the next few days, and use prescribed medication to complete treatment of pain  Diet: Regular Wt Readings from Last 3 Encounters:  05/29/19 65.1 kg  05/15/19 63.5 kg  05/10/19 64 kg    History of present illness:  Hannah Hawkins, a 33 year old female with a medical history significant for sickle cell disease, chronic pain syndrome, opiate dependence, and history of polysubstance abuse presented to ER with right leg pain consistent with typical sickle cell pain crisis.  Patient attempted to use ibuprofen without sustained relief.  Patient also states that she has been out oxycodone and running low on OxyContin.  She states the pain is typical for sickle cell pain crisis.  She denies fevers, chest pain, or shortness of breath.  Patient arrived via EMS.  In route, fentanyl 100 mcg was administered.  Patient has had 3 admissions/visit in the past 6 months.  Was usually getting medications from, but recently her narcotic refills were denied secondary to positive urine drug screen.  UDS was positive for cocaine.  ER course: Rapid COVID test negative.  UDS pending.  CBC and CMP consistent with patient's  baseline.  Oxygen saturation greater than 90% on RA.  Patient afebrile.  Pain persists despite IV Dilaudid, IV Toradol, and IV fluids.  Patient admitted to [facility] for further management of sickle cell pain crisis.  Hospital Course:   Sickle cell anemia with pain crisis:  Patient was admitted for sickle cell pain crisis and managed appropriately with IVF, IV Dilaudid via PCA and IV Toradol, as well as other adjunct therapies per sickle cell pain management protocols.  OxyContin was continued throughout admission without interruption.  PCA Dilaudid was weaned appropriately.  Urine drug screen negative, including opiates.  Repeated drug screen via serum, pending at this time.  Oxycodone 15 mg #20 was sent to patient's pharmacy to complete treatment.  Patient to follow-up with PCP for medication management.  Patient to resume all home medications.  All laboratory values consistent with baseline.  Patient is aware of all upcoming appointments.  She is alert, oriented, and ambulating without assistance.  Patient afebrile and maintaining oxygen saturation above 90% on room air  Patient was discharged home today in a hemodynamically stable condition.   Discharge Exam: Vitals:   05/31/19 0929 05/31/19 1059  BP: 113/82   Pulse: 83   Resp: 18 13  Temp: 98.2 F (36.8 C)   SpO2: 100% 98%   Vitals:   05/31/19 0538 05/31/19 0751 05/31/19 0929 05/31/19 1059  BP: (!) 123/99  113/82   Pulse: 70  83   Resp: '16 14 18 13  ' Temp: 97.6 F (36.4 C)  98.2 F (36.8 C)  TempSrc: Oral  Oral   SpO2: 99% 98% 100% 98%  Weight:      Height:        General appearance : Awake, alert, not in any distress. Speech Clear. Not toxic looking HEENT: Atraumatic and Normocephalic, pupils equally reactive to light and accomodation Neck: Supple, no JVD. No cervical lymphadenopathy.  Chest: Good air entry bilaterally, no added sounds  CVS: S1 S2 regular, no murmurs.  Abdomen: Bowel sounds present, Non tender and not  distended with no gaurding, rigidity or rebound. Extremities: B/L Lower Ext shows no edema, both legs are warm to touch Neurology: Awake alert, and oriented X 3, CN II-XII intact, Non focal Skin: No Rash  Discharge Instructions   Allergies as of 05/31/2019      Reactions   Penicillins Swelling, Rash   Has patient had a PCN reaction causing immediate rash, facial/tongue/throat swelling, SOB or lightheadedness with hypotension: Yes Has patient had a PCN reaction causing severe rash involving mucus membranes or skin necrosis: Yes Has patient had a PCN reaction that required hospitalization No Has patient had a PCN reaction occurring within the last 10 years: no If all of the above answers are "NO", then may proceed with Cephalosporin use.      Medication List    TAKE these medications   baclofen 10 MG tablet Commonly known as: LIORESAL TAKE 1 TABLET(10 MG) BY MOUTH THREE TIMES DAILY AS NEEDED FOR MUSCLE SPASMS What changed: See the new instructions.   Culturelle Caps Take 1 capsule by mouth daily. Take 1 cap po BID x 7 days and then decrease to once a day.   ergocalciferol 1.25 MG (50000 UT) capsule Commonly known as: VITAMIN D2 Take 1 capsule (50,000 Units total) by mouth once a week.   hydroxyurea 500 MG capsule Commonly known as: HYDREA TAKE 2 CAPSULES(1000 MG) BY MOUTH DAILY What changed: See the new instructions.   ibuprofen 800 MG tablet Commonly known as: ADVIL TAKE 1 TABLET(800 MG) BY MOUTH EVERY 8 HOURS AS NEEDED FOR MODERATE PAIN What changed: See the new instructions.   levocetirizine 5 MG tablet Commonly known as: XYZAL TAKE 1 TABLET(5 MG) BY MOUTH EVERY EVENING What changed:   how much to take  how to take this  when to take this  reasons to take this  additional instructions   Narcan 4 MG/0.1ML Liqd nasal spray kit Generic drug: naloxone Place 4 mg into the nose once as needed (overdose).   ondansetron 8 MG tablet Commonly known as:  Zofran Take 1 tablet (8 mg total) by mouth every 8 (eight) hours as needed for nausea or vomiting.   oxyCODONE 15 MG immediate release tablet Commonly known as: ROXICODONE Take 1 tablet (15 mg total) by mouth every 4 (four) hours as needed for pain.   pantoprazole 40 MG tablet Commonly known as: PROTONIX Take 1 tablet (40 mg total) by mouth daily.   promethazine 12.5 MG tablet Commonly known as: PHENERGAN Take 1 tablet (12.5 mg total) by mouth every 4 (four) hours as needed for nausea or vomiting.       The results of significant diagnostics from this hospitalization (including imaging, microbiology, ancillary and laboratory) are listed below for reference.    Significant Diagnostic Studies: Dg Shoulder Left  Result Date: 05/15/2019 CLINICAL DATA:  Diffuse left shoulder pain for the past 3 days. History of sickle cell disease. EXAM: LEFT SHOULDER - 2+ VIEW COMPARISON:  None. FINDINGS: There is no evidence of fracture or dislocation. There  is no evidence of arthropathy or other focal bone abnormality. No sclerosis to suggest avascular necrosis. Soft tissues are unremarkable. IMPRESSION: Negative. Electronically Signed   By: Titus Dubin M.D.   On: 05/15/2019 15:05    Microbiology: Recent Results (from the past 240 hour(s))  SARS Coronavirus 2 (CEPHEID - Performed in [facility] hospital lab), Hosp Order     Status: None   Collection Time: 05/29/19  2:16 AM   Specimen: Nasopharyngeal Swab  Result Value Ref Range Status   SARS Coronavirus 2 NEGATIVE NEGATIVE Final    Comment: (NOTE) If result is NEGATIVE SARS-CoV-2 target nucleic acids are NOT DETECTED. The SARS-CoV-2 RNA is generally detectable in upper and lower  respiratory specimens during the acute phase of infection. The lowest  concentration of SARS-CoV-2 viral copies this assay can detect is 250  copies / mL. A negative result does not preclude SARS-CoV-2 infection  and should not be used as the sole basis for  treatment or other  patient management decisions.  A negative result may occur with  improper specimen collection / handling, submission of specimen other  than nasopharyngeal swab, presence of viral mutation(s) within the  areas targeted by this assay, and inadequate number of viral copies  (<250 copies / mL). A negative result must be combined with clinical  observations, patient history, and epidemiological information. If result is POSITIVE SARS-CoV-2 target nucleic acids are DETECTED. The SARS-CoV-2 RNA is generally detectable in upper and lower  respiratory specimens dur ing the acute phase of infection.  Positive  results are indicative of active infection with SARS-CoV-2.  Clinical  correlation with patient history and other diagnostic information is  necessary to determine patient infection status.  Positive results do  not rule out bacterial infection or co-infection with other viruses. If result is PRESUMPTIVE POSTIVE SARS-CoV-2 nucleic acids MAY BE PRESENT.   A presumptive positive result was obtained on the submitted specimen  and confirmed on repeat testing.  While 2019 novel coronavirus  (SARS-CoV-2) nucleic acids may be present in the submitted sample  additional confirmatory testing may be necessary for epidemiological  and / or clinical management purposes  to differentiate between  SARS-CoV-2 and other Sarbecovirus currently known to infect humans.  If clinically indicated additional testing with an alternate test  methodology [phone]) is advised. The SARS-CoV-2 RNA is generally  detectable in upper and lower respiratory sp ecimens during the acute  phase of infection. The expected result is Negative. Fact Sheet for Patients:  StrictlyIdeas.no Fact Sheet for Healthcare Providers: BankingDealers.co.za This test is not yet approved or cleared by the [place] FDA and has been authorized for detection and/or  diagnosis of SARS-CoV-2 by FDA under an Emergency Use Authorization (EUA).  This EUA will remain in effect (meaning this test can be used) for the duration of the COVID-19 declaration under Section 564(b)(1) of the Act, 21 U.S.C. section 360bbb-3(b)(1), unless the authorization is terminated or revoked sooner. Performed at [facility], [place] [address] [place], [place] [address]      Labs: Basic Metabolic Panel: Recent Labs  Lab 05/28/19 2115 05/29/19 0549  NA 137  --   K 4.4  --   CL 106  --   CO2 25  --   GLUCOSE 116*  --   BUN 10  --   CREATININE 0.71  --   CALCIUM 8.4*  --   MG  --  2.0  PHOS  --  3.7   Liver Function  Tests: Recent Labs  Lab 05/28/19 2115  AST 30  ALT 17  ALKPHOS 45  BILITOT 0.3  PROT 7.1  ALBUMIN 4.0   No results for input(s): LIPASE, AMYLASE in the last 168 hours. No results for input(s): AMMONIA in the last 168 hours. CBC: Recent Labs  Lab 05/28/19 2115 05/30/19 0456  WBC 9.2 9.7  NEUTROABS 5.5  --   HGB 11.6* 11.6*  HCT 31.7* 31.2*  MCV 80.1 79.2*  PLT 333 318   Cardiac Enzymes: No results for input(s): CKTOTAL, CKMB, CKMBINDEX, TROPONINI in the last 168 hours. BNP: Invalid input(s): POCBNP CBG: No results for input(s): GLUCAP in the last 168 hours.  Time coordinating discharge: 50 minutes  Signed:  Donia Pounds  APRN, MSN, FNP-C [facility] [address] [place], [place] [address] [phone]  [facility] 05/31/2019, 1:21 PM

## 2019-05-31 NOTE — Progress Notes (Signed)
Dilaudid PCA syringe was replaced, 3 mls was wasted in stericycle, witnessed by [employer].

## 2019-05-31 NOTE — Progress Notes (Signed)
Discharge instructions given to pt and all questions were answered.  

## 2019-05-31 NOTE — Discharge Instructions (Signed)
Sickle Cell Anemia, Adult °Sickle cell anemia is a condition where your red blood cells are shaped like sickles. Red blood cells carry oxygen through the body. Sickle-shaped cells do not live as long as normal red blood cells. They also clump together and block blood from flowing through the blood vessels. This prevents the body from getting enough oxygen. Sickle cell anemia causes organ damage and pain. It also increases the risk of infection. °Follow these instructions at home: °Medicines °· Take over-the-counter and prescription medicines only as told by your doctor. °· If you were prescribed an antibiotic medicine, take it as told by your doctor. Do not stop taking the antibiotic even if you start to feel better. °· If you develop a fever, do not take medicines to lower the fever right away. Tell your doctor about the fever. °Managing pain, stiffness, and swelling °· Try these methods to help with pain: °? Use a heating pad. °? Take a warm bath. °? Distract yourself, such as by watching TV. °Eating and drinking °· Drink enough fluid to keep your pee (urine) clear or pale yellow. Drink more in hot weather and during exercise. °· Limit or avoid alcohol. °· Eat a healthy diet. Eat plenty of fruits, vegetables, whole grains, and lean protein. °· Take vitamins and supplements as told by your doctor. °Traveling °· When traveling, keep these with you: °? Your medical information. °? The names of your doctors. °? Your medicines. °· If you need to take an airplane, talk to your doctor first. °Activity °· Rest often. °· Avoid exercises that make your heart beat much faster, such as jogging. °General instructions °· Do not use products that have nicotine or tobacco, such as cigarettes and e-cigarettes. If you need help quitting, ask your doctor. °· Consider wearing a medical alert bracelet. °· Avoid being in high places (high altitudes), such as mountains. °· Avoid very hot or cold temperatures. °· Avoid places where the  temperature changes a lot. °· Keep all follow-up visits as told by your doctor. This is important. °Contact a doctor if: °· A joint hurts. °· Your feet or hands hurt or swell. °· You feel tired (fatigued). °Get help right away if: °· You have symptoms of infection. These include: °? Fever. °? Chills. °? Being very tired. °? Irritability. °? Poor eating. °? Throwing up (vomiting). °· You feel dizzy or faint. °· You have new stomach pain, especially on the left side. °· You have a an erection (priapism) that lasts more than 4 hours. °· You have numbness in your arms or legs. °· You have a hard time moving your arms or legs. °· You have trouble talking. °· You have pain that does not go away when you take medicine. °· You are short of breath. °· You are breathing fast. °· You have a long-term cough. °· You have pain in your chest. °· You have a bad headache. °· You have a stiff neck. °· Your stomach looks bloated even though you did not eat much. °· Your skin is pale. °· You suddenly cannot see well. °Summary °· Sickle cell anemia is a condition where your red blood cells are shaped like sickles. °· Follow your doctor's advice on ways to manage pain, food to eat, activities to do, and steps to take for safe travel. °· Get medical help right away if you have any signs of infection, such as a fever. °This information is not intended to replace advice given to you by your   health care provider. Make sure you discuss any questions you have with your health care provider. °Document Released: 09/26/2013 Document Revised: 01/11/2017 Document Reviewed: 01/11/2017 °Elsevier Interactive Patient Education © 2019 [employer] Inc. ° °

## 2019-05-31 NOTE — Progress Notes (Signed)
Wasted 72mls of dilaudid PCA syringe in stericycle, wasted with Physicist, medical.

## 2019-06-05 LAB — DRUG SCREEN 10 W/CONF, SERUM
Amphetamines, IA: NEGATIVE ng/mL
Barbiturates, IA: NEGATIVE ug/mL
Benzodiazepines, IA: NEGATIVE ng/mL
Cocaine & Metabolite, IA: NEGATIVE ng/mL
Methadone, IA: NEGATIVE ng/mL
Opiates, IA: POSITIVE ng/mL — AB
Oxycodones, IA: NEGATIVE ng/mL
Phencyclidine, IA: NEGATIVE ng/mL
Propoxyphene, IA: NEGATIVE ng/mL
THC(Marijuana) Metabolite, IA: NEGATIVE ng/mL

## 2019-06-05 LAB — OPIATES,MS,WB/SP RFX
6-Acetylmorphine: NEGATIVE
Codeine: NEGATIVE ng/mL
Dihydrocodeine: NEGATIVE ng/mL
Hydrocodone: NEGATIVE ng/mL
Hydromorphone: 4.8 ng/mL
Morphine: NEGATIVE ng/mL
Opiate Confirmation: POSITIVE

## 2019-06-05 LAB — OXYCODONES,MS,WB/SP RFX
Oxycocone: NEGATIVE ng/mL
Oxycodones Confirmation: NEGATIVE
Oxymorphone: NEGATIVE ng/mL

## 2019-06-07 ENCOUNTER — Non-Acute Institutional Stay (HOSPITAL_COMMUNITY)
Admission: AD | Admit: 2019-06-07 | Discharge: 2019-06-07 | Disposition: A | Discharge status: Home / Self Care | Payer: Medicaid Other | Source: Ambulatory Visit | Attending: Internal Medicine | Admitting: Internal Medicine

## 2019-06-07 ENCOUNTER — Ambulatory Visit (INDEPENDENT_AMBULATORY_CARE_PROVIDER_SITE_OTHER): Payer: Medicaid Other | Admitting: Family Medicine

## 2019-06-07 ENCOUNTER — Other Ambulatory Visit: Payer: Self-pay | Admitting: Family Medicine

## 2019-06-07 ENCOUNTER — Encounter: Payer: Self-pay | Admitting: Family Medicine

## 2019-06-07 ENCOUNTER — Other Ambulatory Visit: Payer: Self-pay

## 2019-06-07 ENCOUNTER — Encounter (HOSPITAL_COMMUNITY): Payer: Self-pay | Admitting: *Deleted

## 2019-06-07 VITALS — BP 114/79 | HR 82 | Temp 98.4°F | Resp 16 | Ht 62.0 in | Wt 143.0 lb

## 2019-06-07 DIAGNOSIS — F1120 Opioid dependence, uncomplicated: Secondary | ICD-10-CM | POA: Diagnosis not present

## 2019-06-07 DIAGNOSIS — D5700 Hb-SS disease with crisis, unspecified: Secondary | ICD-10-CM | POA: Diagnosis not present

## 2019-06-07 DIAGNOSIS — G894 Chronic pain syndrome: Secondary | ICD-10-CM | POA: Diagnosis not present

## 2019-06-07 DIAGNOSIS — Z87891 Personal history of nicotine dependence: Secondary | ICD-10-CM | POA: Diagnosis not present

## 2019-06-07 DIAGNOSIS — D638 Anemia in other chronic diseases classified elsewhere: Secondary | ICD-10-CM | POA: Diagnosis not present

## 2019-06-07 DIAGNOSIS — Z79891 Long term (current) use of opiate analgesic: Secondary | ICD-10-CM

## 2019-06-07 DIAGNOSIS — M25512 Pain in left shoulder: Secondary | ICD-10-CM | POA: Insufficient documentation

## 2019-06-07 DIAGNOSIS — Z79899 Other long term (current) drug therapy: Secondary | ICD-10-CM | POA: Diagnosis not present

## 2019-06-07 DIAGNOSIS — D5720 Sickle-cell/Hb-C disease without crisis: Secondary | ICD-10-CM | POA: Diagnosis not present

## 2019-06-07 LAB — RAPID URINE DRUG SCREEN, HOSP PERFORMED
Amphetamines: NOT DETECTED
Barbiturates: NOT DETECTED
Benzodiazepines: NOT DETECTED
Cocaine: NOT DETECTED
Opiates: NOT DETECTED
Tetrahydrocannabinol: NOT DETECTED

## 2019-06-07 LAB — COMPREHENSIVE METABOLIC PANEL
ALT: 13 U/L (ref 0–44)
AST: 17 U/L (ref 15–41)
Albumin: 4.1 g/dL (ref 3.5–5.0)
Alkaline Phosphatase: 50 U/L (ref 38–126)
Anion gap: 9 (ref 5–15)
BUN: 11 mg/dL (ref 6–20)
CO2: 22 mmol/L (ref 22–32)
Calcium: 8.8 mg/dL — ABNORMAL LOW (ref 8.9–10.3)
Chloride: 107 mmol/L (ref 98–111)
Creatinine, Ser: 0.78 mg/dL (ref 0.44–1.00)
GFR calc Af Amer: >60 mL/min (ref >60)
GFR calc non Af Amer: >60 mL/min (ref >60)
Glucose, Bld: 89 mg/dL (ref 70–99)
Potassium: 3.8 mmol/L (ref 3.5–5.1)
Sodium: 138 mmol/L (ref 135–145)
Total Bilirubin: 0.6 mg/dL (ref 0.3–1.2)
Total Protein: 7.6 g/dL (ref 6.5–8.1)

## 2019-06-07 LAB — RETICULOCYTES
Immature Retic Fract: 32.2 % — ABNORMAL HIGH (ref 2.3–15.9)
RBC.: 4.25 MIL/uL (ref 3.87–5.11)
Retic Count, Absolute: 96.5 10*3/uL (ref 19.0–186.0)
Retic Ct Pct: 2.3 % (ref 0.4–3.1)

## 2019-06-07 LAB — CBC WITH DIFFERENTIAL/PLATELET
Abs Immature Granulocytes: 0.02 10*3/uL (ref 0.00–0.07)
Basophils Absolute: 0.1 10*3/uL (ref 0.0–0.1)
Basophils Relative: 1 %
Eosinophils Absolute: 0.4 10*3/uL (ref 0.0–0.5)
Eosinophils Relative: 5 %
HCT: 34.1 % — ABNORMAL LOW (ref 36.0–46.0)
Hemoglobin: 12 g/dL (ref 12.0–15.0)
Immature Granulocytes: 0 %
Lymphocytes Relative: 39 %
Lymphs Abs: 3.1 10*3/uL (ref 0.7–4.0)
MCH: 28.2 pg (ref 26.0–34.0)
MCHC: 35.2 g/dL (ref 30.0–36.0)
MCV: 80.2 fL (ref 80.0–100.0)
Monocytes Absolute: 0.8 10*3/uL (ref 0.1–1.0)
Monocytes Relative: 10 %
Neutro Abs: 3.4 10*3/uL (ref 1.7–7.7)
Neutrophils Relative %: 45 %
Platelets: 361 10*3/uL (ref 150–400)
RBC: 4.25 MIL/uL (ref 3.87–5.11)
RDW: 14.1 % (ref 11.5–15.5)
WBC: 7.8 10*3/uL (ref 4.0–10.5)
nRBC: 1.3 % — ABNORMAL HIGH (ref 0.0–0.2)

## 2019-06-07 MED ORDER — SODIUM CHLORIDE 0.9 % IV SOLN
25.0000 mg | INTRAVENOUS | Status: DC | PRN
  Filled 2019-06-07: qty 0.5

## 2019-06-07 MED ORDER — SODIUM CHLORIDE 0.45 % IV SOLN
INTRAVENOUS | Status: DC
  Administered 2019-06-07: 13:00:00 via INTRAVENOUS

## 2019-06-07 MED ORDER — ACETAMINOPHEN 500 MG PO TABS
1000.0000 mg | ORAL_TABLET | Freq: Once | ORAL | Status: AC
  Administered 2019-06-07: 1000 mg via ORAL
  Filled 2019-06-07: qty 2

## 2019-06-07 MED ORDER — PROMETHAZINE HCL 25 MG/ML IJ SOLN
12.5000 mg | Freq: Once | INTRAMUSCULAR | Status: AC
  Administered 2019-06-07: 12.5 mg via INTRAVENOUS
  Filled 2019-06-07: qty 1

## 2019-06-07 MED ORDER — DIPHENHYDRAMINE HCL 25 MG PO CAPS: 25.0000 mg | ORAL_CAPSULE | ORAL | Status: DC | PRN

## 2019-06-07 MED ORDER — KETOROLAC TROMETHAMINE 30 MG/ML IJ SOLN
15.0000 mg | Freq: Once | INTRAMUSCULAR | Status: AC
  Administered 2019-06-07: 15 mg via INTRAVENOUS
  Filled 2019-06-07: qty 1

## 2019-06-07 MED ORDER — HYDROMORPHONE 1 MG/ML IV SOLN
INTRAVENOUS | Status: DC
  Administered 2019-06-07: 30 mg via INTRAVENOUS
  Administered 2019-06-07: 6 mg via INTRAVENOUS
  Filled 2019-06-07: qty 30

## 2019-06-07 MED ORDER — OXYCODONE HCL 15 MG PO TABS: 15.0000 mg | ORAL_TABLET | Freq: Four times a day (QID) | ORAL | 0 refills | Status: DC | PRN

## 2019-06-07 MED ORDER — NALOXONE HCL 0.4 MG/ML IJ SOLN: 0.4000 mg | INTRAMUSCULAR | Status: DC | PRN

## 2019-06-07 MED ORDER — ONDANSETRON HCL 4 MG/2ML IJ SOLN: 4.0000 mg | Freq: Four times a day (QID) | INTRAMUSCULAR | Status: DC | PRN

## 2019-06-07 MED ORDER — SODIUM CHLORIDE 0.9% FLUSH: 9.0000 mL | INTRAVENOUS | Status: DC | PRN

## 2019-06-07 MED ORDER — HYDROMORPHONE HCL 2 MG/ML IJ SOLN: 2.0000 mg | Freq: Once | INTRAMUSCULAR | Status: DC

## 2019-06-07 NOTE — Progress Notes (Signed)
Patient admitted to the day infusion hospital from primary care for sickle cell pain crisis. Initially, patient reported generalized pain rated 10/10. For pain management, patient placed on Dilaudid PCA, given 15 mg Toradol, 1000 mg Tylenol and hydrated with IV fluids. At discharge, patient rated pain at 4/10. Vital signs stable. Discharge instructions given. Patient alert, oriented and ambulatory at discharge.

## 2019-06-07 NOTE — Patient Instructions (Signed)
Sickle Cell Anemia, Adult °Sickle cell anemia is a condition where your red blood cells are shaped like sickles. Red blood cells carry oxygen through the body. Sickle-shaped cells do not live as long as normal red blood cells. They also clump together and block blood from flowing through the blood vessels. This prevents the body from getting enough oxygen. Sickle cell anemia causes organ damage and pain. It also increases the risk of infection. °Follow these instructions at home: °Medicines °· Take over-the-counter and prescription medicines only as told by your doctor. °· If you were prescribed an antibiotic medicine, take it as told by your doctor. Do not stop taking the antibiotic even if you start to feel better. °· If you develop a fever, do not take medicines to lower the fever right away. Tell your doctor about the fever. °Managing pain, stiffness, and swelling °· Try these methods to help with pain: °? Use a heating pad. °? Take a warm bath. °? Distract yourself, such as by watching TV. °Eating and drinking °· Drink enough fluid to keep your pee (urine) clear or pale yellow. Drink more in hot weather and during exercise. °· Limit or avoid alcohol. °· Eat a healthy diet. Eat plenty of fruits, vegetables, whole grains, and lean protein. °· Take vitamins and supplements as told by your doctor. °Traveling °· When traveling, keep these with you: °? Your medical information. °? The names of your doctors. °? Your medicines. °· If you need to take an airplane, talk to your doctor first. °Activity °· Rest often. °· Avoid exercises that make your heart beat much faster, such as jogging. °General instructions °· Do not use products that have nicotine or tobacco, such as cigarettes and e-cigarettes. If you need help quitting, ask your doctor. °· Consider wearing a medical alert bracelet. °· Avoid being in high places (high altitudes), such as mountains. °· Avoid very hot or cold temperatures. °· Avoid places where the  temperature changes a lot. °· Keep all follow-up visits as told by your doctor. This is important. °Contact a doctor if: °· A joint hurts. °· Your feet or hands hurt or swell. °· You feel tired (fatigued). °Get help right away if: °· You have symptoms of infection. These include: °? Fever. °? Chills. °? Being very tired. °? Irritability. °? Poor eating. °? Throwing up (vomiting). °· You feel dizzy or faint. °· You have new stomach pain, especially on the left side. °· You have a an erection (priapism) that lasts more than 4 hours. °· You have numbness in your arms or legs. °· You have a hard time moving your arms or legs. °· You have trouble talking. °· You have pain that does not go away when you take medicine. °· You are short of breath. °· You are breathing fast. °· You have a long-term cough. °· You have pain in your chest. °· You have a bad headache. °· You have a stiff neck. °· Your stomach looks bloated even though you did not eat much. °· Your skin is pale. °· You suddenly cannot see well. °Summary °· Sickle cell anemia is a condition where your red blood cells are shaped like sickles. °· Follow your doctor's advice on ways to manage pain, food to eat, activities to do, and steps to take for safe travel. °· Get medical help right away if you have any signs of infection, such as a fever. °This information is not intended to replace advice given to you by your   health care provider. Make sure you discuss any questions you have with your health care provider. °Document Released: 09/26/2013 Document Revised: 01/11/2017 Document Reviewed: 01/11/2017 °Elsevier Interactive Patient Education © 2019 [employer]. ° °

## 2019-06-07 NOTE — Discharge Instructions (Signed)
Sickle Cell Anemia, Adult °Sickle cell anemia is a condition where your red blood cells are shaped like sickles. Red blood cells carry oxygen through the body. Sickle-shaped cells do not live as long as normal red blood cells. They also clump together and block blood from flowing through the blood vessels. This prevents the body from getting enough oxygen. Sickle cell anemia causes organ damage and pain. It also increases the risk of infection. °Follow these instructions at home: °Medicines °· Take over-the-counter and prescription medicines only as told by your doctor. °· If you were prescribed an antibiotic medicine, take it as told by your doctor. Do not stop taking the antibiotic even if you start to feel better. °· If you develop a fever, do not take medicines to lower the fever right away. Tell your doctor about the fever. °Managing pain, stiffness, and swelling °· Try these methods to help with pain: °? Use a heating pad. °? Take a warm bath. °? Distract yourself, such as by watching TV. °Eating and drinking °· Drink enough fluid to keep your pee (urine) clear or pale yellow. Drink more in hot weather and during exercise. °· Limit or avoid alcohol. °· Eat a healthy diet. Eat plenty of fruits, vegetables, whole grains, and lean protein. °· Take vitamins and supplements as told by your doctor. °Traveling °· When traveling, keep these with you: °? Your medical information. °? The names of your doctors. °? Your medicines. °· If you need to take an airplane, talk to your doctor first. °Activity °· Rest often. °· Avoid exercises that make your heart beat much faster, such as jogging. °General instructions °· Do not use products that have nicotine or tobacco, such as cigarettes and e-cigarettes. If you need help quitting, ask your doctor. °· Consider wearing a medical alert bracelet. °· Avoid being in high places (high altitudes), such as mountains. °· Avoid very hot or cold temperatures. °· Avoid places where the  temperature changes a lot. °· Keep all follow-up visits as told by your doctor. This is important. °Contact a doctor if: °· A joint hurts. °· Your feet or hands hurt or swell. °· You feel tired (fatigued). °Get help right away if: °· You have symptoms of infection. These include: °? Fever. °? Chills. °? Being very tired. °? Irritability. °? Poor eating. °? Throwing up (vomiting). °· You feel dizzy or faint. °· You have new stomach pain, especially on the left side. °· You have a an erection (priapism) that lasts more than 4 hours. °· You have numbness in your arms or legs. °· You have a hard time moving your arms or legs. °· You have trouble talking. °· You have pain that does not go away when you take medicine. °· You are short of breath. °· You are breathing fast. °· You have a long-term cough. °· You have pain in your chest. °· You have a bad headache. °· You have a stiff neck. °· Your stomach looks bloated even though you did not eat much. °· Your skin is pale. °· You suddenly cannot see well. °Summary °· Sickle cell anemia is a condition where your red blood cells are shaped like sickles. °· Follow your doctor's advice on ways to manage pain, food to eat, activities to do, and steps to take for safe travel. °· Get medical help right away if you have any signs of infection, such as a fever. °This information is not intended to replace advice given to you by your   health care provider. Make sure you discuss any questions you have with your health care provider. °Document Released: 09/26/2013 Document Revised: 01/11/2017 Document Reviewed: 01/11/2017 °Elsevier Interactive Patient Education © 2019 [employer] Inc. ° °

## 2019-06-07 NOTE — Discharge Summary (Signed)
[facility] Discharge Summary   Patient ID: Hannah Hawkins MRN: [id number] DOB/AGE: 05-24-1986 33 y.o.  Admit date: 06/07/2019 Discharge date: 06/07/2019  Primary Care Physician:  Lanae Boast, [place]  Admission Diagnoses:  Active Problems:   Sickle cell pain crisis [facility])   Discharge Medications:  Allergies as of 06/07/2019      Reactions   Penicillins Swelling, Rash   Has patient had a PCN reaction causing immediate rash, facial/tongue/throat swelling, SOB or lightheadedness with hypotension: Yes Has patient had a PCN reaction causing severe rash involving mucus membranes or skin necrosis: Yes Has patient had a PCN reaction that required hospitalization No Has patient had a PCN reaction occurring within the last 10 years: no If all of the above answers are "NO", then may proceed with Cephalosporin use.      Medication List    ASK your doctor about these medications   baclofen 10 MG tablet Commonly known as: LIORESAL TAKE 1 TABLET(10 MG) BY MOUTH THREE TIMES DAILY AS NEEDED FOR MUSCLE SPASMS   Culturelle Caps Take 1 capsule by mouth daily. Take 1 cap po BID x 7 days and then decrease to once a day.   ergocalciferol 1.25 MG (50000 UT) capsule Commonly known as: VITAMIN D2 Take 1 capsule (50,000 Units total) by mouth once a week.   hydroxyurea 500 MG capsule Commonly known as: HYDREA TAKE 2 CAPSULES(1000 MG) BY MOUTH DAILY   ibuprofen 800 MG tablet Commonly known as: ADVIL TAKE 1 TABLET(800 MG) BY MOUTH EVERY 8 HOURS AS NEEDED FOR MODERATE PAIN   levocetirizine 5 MG tablet Commonly known as: XYZAL TAKE 1 TABLET(5 MG) BY MOUTH EVERY EVENING   Narcan 4 MG/0.1ML Liqd nasal spray kit Generic drug: naloxone Place 4 mg into the nose once as needed (overdose).   ondansetron 8 MG tablet Commonly known as: Zofran Take 1 tablet (8 mg total) by mouth every 8 (eight) hours as needed for nausea or vomiting.   oxyCODONE 15 MG immediate release  tablet Commonly known as: ROXICODONE Take 1 tablet (15 mg total) by mouth every 6 (six) hours as needed for up to 15 days for pain. Ask about: Which instructions should I use?   pantoprazole 40 MG tablet Commonly known as: PROTONIX Take 1 tablet (40 mg total) by mouth daily.   promethazine 12.5 MG tablet Commonly known as: PHENERGAN Take 1 tablet (12.5 mg total) by mouth every 4 (four) hours as needed for nausea or vomiting.        Consults:  None  Significant Diagnostic Studies:  Dg Shoulder Left  Result Date: 05/15/2019 CLINICAL DATA:  Diffuse left shoulder pain for the past 3 days. History of sickle cell disease. EXAM: LEFT SHOULDER - 2+ VIEW COMPARISON:  None. FINDINGS: There is no evidence of fracture or dislocation. There is no evidence of arthropathy or other focal bone abnormality. No sclerosis to suggest avascular necrosis. Soft tissues are unremarkable. IMPRESSION: Negative. Electronically Signed   By: Titus Dubin M.D.   On: 05/15/2019 15:05    History of present illness:  Hannah Hawkins, a 33 year old female with a medical history significant for sickle cell disease, opiate dependence, chronic pain syndrome, history of polysubstance abuse, and history of anemia of chronic disease presents complaining of generalized pain that is consistent with typical sickle cell pain crisis.  Patient states that pain intensity increased yesterday and has been unrelieved by ibuprofen and oxycodone 15 mg medications were last taken on yesterday.  She states that she is  out home opiate medications.  Current pain intensity 10 characterized as constant and aching.  Patient has not identified any provocative factors concerning current crisis Patient denies fever, chills, recent travel, or exposure to COVID-19. Patient also denies headache, chest pain, shortness of breath, nausea, vomiting, or diarrhea.   [facility] Course: Patient admitted for sickle cell pain crisis. All  laboratory values reviewed, consistent with her baseline. Patient afebrile and maintaining oxygen saturation above 90% on RA.  Pain management with IV Dilaudid via PCA per weight-based protocol.  Settings of 0.5 mg, 10-minute lockout, and 3 mg/h. IV fluids initiated, 0.45% saline at 100 mL/h Tylenol 1000 mg by mouth x1 IV Toradol 15 mg x 1  Patient states the pain intensity is 4/10.  Patient says that she will attempt to manage pain at home with home opiate medication. Patient advised to follow-up with PCP for medication management. Patient expressed understanding.  Patient alert, oriented, and ambulating without assistance.  Patient will discharge home in a hemodynamically stable condition.  Discharge instructions: Resume all home medications.  Follow up with PCP as previously  scheduled.   Discussed the importance of drinking 64 ounces of water daily to  help prevent pain crises, it is important to drink plenty of water throughout the day. This is because dehydration of red blood cells may lead further sickling.   Avoid all stressors that precipitate sickle cell pain crisis.     The patient was given clear instructions to go to ER or return to medical center if symptoms do not improve, worsen or new problems develop.   Physical Exam at Discharge:  BP 116/85 (BP Location: Right Arm)   Pulse 73   Resp 10   LMP 05/23/2019   SpO2 99%  Physical Exam Constitutional:      Appearance: Normal appearance.  HENT:     Nose: Nose normal.     Mouth/Throat:     Mouth: Mucous membranes are moist.  Cardiovascular:     Rate and Rhythm: Normal rate and regular rhythm.     Pulses: Normal pulses.     Heart sounds: Normal heart sounds.  Pulmonary:     Effort: Pulmonary effort is normal.     Breath sounds: Normal breath sounds.  Abdominal:     General: Bowel sounds are normal.  Musculoskeletal: Normal range of motion.  Skin:    General: Skin is warm and dry.  Neurological:     Mental  Status: She is alert and oriented to person, place, and time.  Psychiatric:        Mood and Affect: Mood normal.        Behavior: Behavior normal.        Thought Content: Thought content normal.        Judgment: Judgment normal.      Disposition at Discharge: Discharge disposition: 01-Home or Self Care       Discharge Orders:   Condition at Discharge:   Stable  Time spent on Discharge:  Greater than 30 minutes.  Signed: Donia Pounds  APRN, MSN, FNP-C [facility] [address] [place], [place] [address] [phone] 06/07/2019, 10:57 PM

## 2019-06-07 NOTE — Progress Notes (Signed)
Patient admitted to the day hospital for treatment of sickle cell pain crisis. Patient reported generalized pain rated 10/10. Patient placed on Dilaudid PCA, given IV Phenergan, PO Tylenol, IV Toradol  and hydrated with IV fluids. At discharge patient reported  pain at 4/10. Discharge instructions given to patient. Patient alert, oriented and ambulatory at discharge.

## 2019-06-07 NOTE — H&P (Signed)
[facility] History and Physical   Date: 06/07/2019  Patient name: Hannah Hawkins Medical record number: [id number] Date of birth: [birth year] Age: 33 y.o. Gender: female PCP: Lanae Boast, [place]  Attending physician: Tresa Garter, MD  Chief Complaint: Sickle cell pain  History of Present Illness: Hannah Hawkins, a 33 year old female with a medical history significant for sickle cell disease, opiate dependence, chronic pain syndrome, history of polysubstance abuse, and history of anemia of chronic disease presents complaining of generalized pain that is consistent with typical sickle cell pain crisis.  Patient states that pain intensity increased yesterday and has been unrelieved by ibuprofen and oxycodone 15 mg medications were last taken on yesterday.  She states that she is out home opiate medications.  Current pain intensity 10 characterized as constant and aching.  Patient has not identified any provocative factors concerning current crisis Patient denies fever, chills, recent travel, or exposure to COVID-19. Patient also denies headache, chest pain, shortness of breath, nausea, vomiting, or diarrhea. Meds: Medications Prior to Admission  Medication Sig Dispense Refill Last Dose  . baclofen (LIORESAL) 10 MG tablet TAKE 1 TABLET(10 MG) BY MOUTH THREE TIMES DAILY AS NEEDED FOR MUSCLE SPASMS (Patient taking differently: Take 10 mg by mouth 3 (three) times daily as needed for muscle spasms. ) 30 tablet 0   . ergocalciferol (VITAMIN D2) 1.25 MG (50000 UT) capsule Take 1 capsule (50,000 Units total) by mouth once a week. 12 capsule 3   . hydroxyurea (HYDREA) 500 MG capsule TAKE 2 CAPSULES(1000 MG) BY MOUTH DAILY (Patient taking differently: Take 1,000 mg by mouth daily. ) 60 capsule 2   . ibuprofen (ADVIL) 800 MG tablet TAKE 1 TABLET(800 MG) BY MOUTH EVERY 8 HOURS AS NEEDED FOR MODERATE PAIN (Patient taking differently: Take 800 mg by mouth every 8 (eight) hours as  needed for moderate pain. ) 30 tablet 5   . Lactobacillus Rhamnosus, GG, (CULTURELLE) CAPS Take 1 capsule by mouth daily. Take 1 cap po BID x 7 days and then decrease to once a day. (Patient not taking: Reported on 05/28/2019) 45 capsule 0   . levocetirizine (XYZAL) 5 MG tablet TAKE 1 TABLET(5 MG) BY MOUTH EVERY EVENING (Patient taking differently: Take 5 mg by mouth daily as needed for allergies. ) 90 tablet 1   . NARCAN 4 MG/0.1ML LIQD nasal spray kit Place 4 mg into the nose once as needed (overdose).   0   . ondansetron (ZOFRAN) 8 MG tablet Take 1 tablet (8 mg total) by mouth every 8 (eight) hours as needed for nausea or vomiting. 30 tablet 1   . oxyCODONE (ROXICODONE) 15 MG immediate release tablet Take 1 tablet (15 mg total) by mouth every 4 (four) hours as needed for pain. 15 tablet 0   . pantoprazole (PROTONIX) 40 MG tablet Take 1 tablet (40 mg total) by mouth daily. 30 tablet 3   . promethazine (PHENERGAN) 12.5 MG tablet Take 1 tablet (12.5 mg total) by mouth every 4 (four) hours as needed for nausea or vomiting. 30 tablet 0     Allergies: Penicillins Past Medical History:  Diagnosis Date  . Sickle cell anemia (HCC)    Past Surgical History:  Procedure Laterality Date  . EYE SURGERY  03/09/2017   left eye   Family History  Problem Relation Age of Onset  . Diabetes Father    Social History   Socioeconomic History  . Marital status: Single    Spouse name: Not on file  .  Number of children: Not on file  . Years of education: Not on file  . Highest education level: Not on file  Occupational History  . Not on file  Social Needs  . Financial resource strain: Not on file  . Food insecurity    Worry: Not on file    Inability: Not on file  . Transportation needs    Medical: Not on file    Non-medical: Not on file  Tobacco Use  . Smoking status: Former Smoker    Types: Cigarettes  . Smokeless tobacco: Never Used  Substance and Sexual Activity  . Alcohol use: Not Currently   . Drug use: No  . Sexual activity: Yes  Lifestyle  . Physical activity    Days per week: Not on file    Minutes per session: Not on file  . Stress: Not on file  Relationships  . Social Herbalist on phone: Not on file    Gets together: Not on file    Attends religious service: Not on file    Active member of club or organization: Not on file    Attends meetings of clubs or organizations: Not on file    Relationship status: Not on file  . Intimate partner violence    Fear of current or ex partner: Not on file    Emotionally abused: Not on file    Physically abused: Not on file    Forced sexual activity: Not on file  Other Topics Concern  . Not on file  Social History Narrative  . Not on file   Review of Systems  Constitutional: Negative for chills and fever.  HENT: Negative.   Eyes: Negative for blurred vision and double vision.  Respiratory: Negative for cough and hemoptysis.   Cardiovascular: Negative for chest pain.  Gastrointestinal: Negative.   Musculoskeletal: Positive for back pain and joint pain.  Skin: Negative.   Neurological: Negative.   Endo/Heme/Allergies: Negative.   Psychiatric/Behavioral: Negative.      Physical Exam: Last menstrual period 05/23/2019. Physical Exam Constitutional:      Appearance: Normal appearance.  HENT:     Mouth/Throat:     Mouth: Mucous membranes are moist.  Eyes:     Pupils: Pupils are equal, round, and reactive to light.  Cardiovascular:     Pulses: Normal pulses.     Heart sounds: Normal heart sounds.  Pulmonary:     Effort: Pulmonary effort is normal.     Breath sounds: Normal breath sounds.  Abdominal:     General: Bowel sounds are normal.  Musculoskeletal: Normal range of motion.  Skin:    General: Skin is warm.  Neurological:     General: No focal deficit present.     Mental Status: She is alert. Mental status is at baseline.  Psychiatric:        Mood and Affect: Mood normal.        Behavior:  Behavior normal.        Thought Content: Thought content normal.        Judgment: Judgment normal.      Lab results: No results found for this or any previous visit (from the past 24 hour(s)).  Imaging results:  No results found.   Assessment & Plan: Patient admitted for sickle cell pain crisis.  Pain is been uncontrolled by home pain medications.  Patient highly opiate tolerant, initiate PCA Dilaudid per weight-based protocol.  Settings of 0.5 mg, 10-minute lockout, and 3 mg/h. Toradol  15 mg IV x1. Tylenol 1000 mg by mouth x1 Initiate IV fluids, 0.45% saline at 100 mL/h. Review see for chill, CMP, reticulocytes, and urine drug screen.  Review is also stable, available Patient will be reevaluated for pain intensity in the context of function and relationship to baseline as care progresses If no significant pain relief, transfer to inpatient services for higher level of care   [place], MSN, FNP-C [facility] [address] [place], [place] [address] [phone]    06/07/2019, 12:14 PM

## 2019-06-07 NOTE — Progress Notes (Signed)
[facility]   Progress Note: Sick Visit Provider: Lanae Boast, FNP  SUBJECTIVE:   Hannah Hawkins is a 33 y.o. female who  has a past medical history of Sickle cell anemia ([place]).. Patient presents today for Sickle Cell Anemia  Patient was positive for cocaine on 05/12/2019 through the serum drug screen. Her urine has been inconsistent with her opioid use. She denies the use of illegal substances. States that she was given magnesium in the hospital and since then, she states that she has been feeling "jittery" and having diarrhea. She report wanting to go to the day hospital due to severe pain in her shoulders, upper back and legs.  Review of Systems  Constitutional: Negative.   HENT: Negative.   Eyes: Negative.   Respiratory: Negative.   Cardiovascular: Negative.   Gastrointestinal: Positive for diarrhea.  Genitourinary: Negative.   Musculoskeletal: Positive for back pain, joint pain and myalgias.  Skin: Negative.   Neurological: Negative.   Psychiatric/Behavioral: Negative.      OBJECTIVE: BP 114/79 (BP Location: Left Arm, Patient Position: Sitting, Cuff Size: Normal)   Pulse 82   Temp 98.4 F (36.9 C) (Oral)   Resp 16   Ht 5\' 2"  (1.575 m)   Wt 143 lb (64.9 kg)   LMP 05/23/2019   SpO2 100%   BMI 26.16 kg/m   Wt Readings from Last 3 Encounters:  06/07/19 143 lb (64.9 kg)  05/29/19 143 lb 8.3 oz (65.1 kg)  05/15/19 140 lb (63.5 kg)     Physical Exam Vitals signs and nursing note reviewed.  Constitutional:      General: She is not in acute distress.    Appearance: Normal appearance.  HENT:     Head: Normocephalic and atraumatic.  Eyes:     Extraocular Movements: Extraocular movements intact.     Conjunctiva/sclera: Conjunctivae normal.     Pupils: Pupils are equal, round, and reactive to light.  Cardiovascular:     Rate and Rhythm: Normal rate and regular rhythm.     Heart sounds: No murmur.  Pulmonary:   Effort: Pulmonary effort is normal.     Breath sounds: Normal breath sounds.  Skin:    General: Skin is warm and dry.  Neurological:     Mental Status: She is alert and oriented to person, place, and time.  Psychiatric:        Mood and Affect: Mood normal.        Behavior: Behavior normal.        Thought Content: Thought content normal.        Judgment: Judgment normal.     ASSESSMENT/PLAN:  1. Sickle cell anemia with crisis [facility]) Discussed that patient will need 2 negative drug screens in order to resume narcotic medications. If patient has another cocaine positive drug screen, she will be referred to pain management for further treatment of her pain.  - Urinalysis Dipstick - Drug Screen 11 w/Conf, Ser  2. Chronic prescription opiate use - Drug Screen 11 w/Conf, Ser         The patient was given clear instructions to go to ER or return to medical center if symptoms do not improve, worsen or new problems develop. The patient verbalized understanding and agreed with plan of care.   Ms. Doug Sou. Nathaneil Canary, FNP-BC [facility] [place], [place] [address] [phone]     This note has been created with Viviann Spare  Geophysicist/field seismologist. Any transcriptional errors are unintentional.

## 2019-06-12 LAB — DRUG SCREEN 11 W/CONF, SE
Amphetamines, IA: NEGATIVE ng/mL
Barbiturates, IA: NEGATIVE ug/mL
Benzodiazepines, IA: NEGATIVE ng/mL
Cocaine & Metabolite, IA: NEGATIVE ng/mL
Ethyl Alcohol, Enz: NEGATIVE gm/dL
Methadone, IA: NEGATIVE ng/mL
Opiates, IA: NEGATIVE ng/mL
Oxycodones, IA: NEGATIVE ng/mL
Phencyclidine, IA: NEGATIVE ng/mL
Propoxyphene, IA: NEGATIVE ng/mL
THC(Marijuana) Metabolite, IA: NEGATIVE ng/mL

## 2019-06-13 ENCOUNTER — Other Ambulatory Visit: Payer: Self-pay | Admitting: Family Medicine

## 2019-06-13 ENCOUNTER — Inpatient Hospital Stay (HOSPITAL_COMMUNITY)
Admission: EM | Admit: 2019-06-13 | Discharge: 2019-06-13 | DRG: 812 | Discharge status: Against Medical Advice | Payer: Medicaid Other | Attending: Internal Medicine | Admitting: Internal Medicine

## 2019-06-13 ENCOUNTER — Encounter (HOSPITAL_COMMUNITY): Payer: Self-pay

## 2019-06-13 ENCOUNTER — Other Ambulatory Visit: Payer: Self-pay

## 2019-06-13 ENCOUNTER — Emergency Department (HOSPITAL_COMMUNITY): Payer: Medicaid Other

## 2019-06-13 DIAGNOSIS — Z5329 Procedure and treatment not carried out because of patient's decision for other reasons: Secondary | ICD-10-CM | POA: Diagnosis present

## 2019-06-13 DIAGNOSIS — D638 Anemia in other chronic diseases classified elsewhere: Secondary | ICD-10-CM | POA: Diagnosis present

## 2019-06-13 DIAGNOSIS — F1120 Opioid dependence, uncomplicated: Secondary | ICD-10-CM | POA: Diagnosis present

## 2019-06-13 DIAGNOSIS — K219 Gastro-esophageal reflux disease without esophagitis: Secondary | ICD-10-CM | POA: Diagnosis present

## 2019-06-13 DIAGNOSIS — Z87891 Personal history of nicotine dependence: Secondary | ICD-10-CM

## 2019-06-13 DIAGNOSIS — D5700 Hb-SS disease with crisis, unspecified: Principal | ICD-10-CM | POA: Diagnosis present

## 2019-06-13 DIAGNOSIS — D5720 Sickle-cell/Hb-C disease without crisis: Secondary | ICD-10-CM

## 2019-06-13 DIAGNOSIS — Z833 Family history of diabetes mellitus: Secondary | ICD-10-CM

## 2019-06-13 DIAGNOSIS — G894 Chronic pain syndrome: Secondary | ICD-10-CM | POA: Diagnosis present

## 2019-06-13 LAB — COMPREHENSIVE METABOLIC PANEL
ALT: 11 U/L (ref 0–44)
AST: 16 U/L (ref 15–41)
Albumin: 4.1 g/dL (ref 3.5–5.0)
Alkaline Phosphatase: 48 U/L (ref 38–126)
Anion gap: 6 (ref 5–15)
BUN: 8 mg/dL (ref 6–20)
CO2: 27 mmol/L (ref 22–32)
Calcium: 9.1 mg/dL (ref 8.9–10.3)
Chloride: 106 mmol/L (ref 98–111)
Creatinine, Ser: 0.72 mg/dL (ref 0.44–1.00)
GFR calc Af Amer: >60 mL/min (ref >60)
GFR calc non Af Amer: >60 mL/min (ref >60)
Glucose, Bld: 104 mg/dL — ABNORMAL HIGH (ref 70–99)
Potassium: 3.8 mmol/L (ref 3.5–5.1)
Sodium: 139 mmol/L (ref 135–145)
Total Bilirubin: 0.7 mg/dL (ref 0.3–1.2)
Total Protein: 7.3 g/dL (ref 6.5–8.1)

## 2019-06-13 LAB — CBC WITH DIFFERENTIAL/PLATELET
Abs Immature Granulocytes: 0.02 10*3/uL (ref 0.00–0.07)
Basophils Absolute: 0.1 10*3/uL (ref 0.0–0.1)
Basophils Relative: 1 %
Eosinophils Absolute: 0.4 10*3/uL (ref 0.0–0.5)
Eosinophils Relative: 6 %
HCT: 31.6 % — ABNORMAL LOW (ref 36.0–46.0)
Hemoglobin: 11.4 g/dL — ABNORMAL LOW (ref 12.0–15.0)
Immature Granulocytes: 0 %
Lymphocytes Relative: 34 %
Lymphs Abs: 2.4 10*3/uL (ref 0.7–4.0)
MCH: 28.8 pg (ref 26.0–34.0)
MCHC: 36.1 g/dL — ABNORMAL HIGH (ref 30.0–36.0)
MCV: 79.8 fL — ABNORMAL LOW (ref 80.0–100.0)
Monocytes Absolute: 0.6 10*3/uL (ref 0.1–1.0)
Monocytes Relative: 9 %
Neutro Abs: 3.6 10*3/uL (ref 1.7–7.7)
Neutrophils Relative %: 50 %
Platelets: 356 10*3/uL (ref 150–400)
RBC: 3.96 MIL/uL (ref 3.87–5.11)
RDW: 14.3 % (ref 11.5–15.5)
WBC: 7.1 10*3/uL (ref 4.0–10.5)
nRBC: 3.5 % — ABNORMAL HIGH (ref 0.0–0.2)

## 2019-06-13 LAB — RETICULOCYTES
Immature Retic Fract: 40.5 % — ABNORMAL HIGH (ref 2.3–15.9)
RBC.: 3.96 MIL/uL (ref 3.87–5.11)
Retic Count, Absolute: 108.9 10*3/uL (ref 19.0–186.0)
Retic Ct Pct: 2.8 % (ref 0.4–3.1)

## 2019-06-13 LAB — I-STAT BETA HCG BLOOD, ED (NOT ORDERABLE): I-stat hCG, quantitative: <5 m[IU]/mL (ref <5)

## 2019-06-13 MED ORDER — ONDANSETRON HCL 4 MG/2ML IJ SOLN: 4.0000 mg | INTRAMUSCULAR | Status: DC | PRN

## 2019-06-13 MED ORDER — HYDROMORPHONE HCL 2 MG/ML IJ SOLN
2.0000 mg | INTRAMUSCULAR | Status: AC
  Administered 2019-06-13: 2 mg via INTRAVENOUS
  Filled 2019-06-13: qty 1

## 2019-06-13 MED ORDER — HYDROMORPHONE HCL 2 MG/ML IJ SOLN: 2.0000 mg | INTRAMUSCULAR | Status: AC

## 2019-06-13 MED ORDER — SODIUM CHLORIDE 0.9% FLUSH
3.0000 mL | Freq: Once | INTRAVENOUS | Status: AC
  Administered 2019-06-13: 3 mL via INTRAVENOUS

## 2019-06-13 MED ORDER — HYDROMORPHONE HCL 2 MG/ML IJ SOLN
2.0000 mg | INTRAMUSCULAR | Status: AC
  Administered 2019-06-13: 15:00:00 2 mg via INTRAVENOUS
  Filled 2019-06-13: qty 1

## 2019-06-13 MED ORDER — DEXTROSE-NACL 5-0.45 % IV SOLN
INTRAVENOUS | Status: DC
  Administered 2019-06-13: 13:00:00 via INTRAVENOUS

## 2019-06-13 MED ORDER — BACLOFEN 10 MG PO TABS: 10.0000 mg | ORAL_TABLET | Freq: Three times a day (TID) | ORAL | 0 refills | Status: DC | PRN

## 2019-06-13 NOTE — Telephone Encounter (Signed)
Please advise if this needs to be refilled?

## 2019-06-13 NOTE — Progress Notes (Signed)
Hannah Hawkins, a 33 year old female with a medical history significant for sickle cell disease, chronic pain syndrome, opiate dependence, history of anemia of chronic disease, and history of polysubstance abuse presented to ER complaining of pain in bilateral thighs radiating to hips.  Patient was treated and evaluated by ER provider who contacted sickle cell admitting team for admission for sickle cell pain crisis.  Patient declined admission prior to evaluation.    Donia Pounds  APRN, MSN, FNP-C [facility] [address] [place], [place] [address] [phone]

## 2019-06-13 NOTE — ED Triage Notes (Signed)
Per [facility]: Pt is coming from home with complaint of SCC. Pt reports to EMS that the pain started at 0400 this morning VSS.  Pt reports pain in the right leg.

## 2019-06-13 NOTE — ED Provider Notes (Signed)
6:05 PM-approximate nursing states that they have discussed with the patient, her wishes for discharge.  He had been admitted earlier today for sickle cell basis with pain.  After she was seen by the admitting team, she decided that she wanted to go home.  At this time, she is comfortable and is wondering if the NP who saw her earlier sent a prescription for baclofen to her pharmacy.  Chart review at this time does not indicate that a prescription was sent.  I agreed to give her short-term option for baclofen.  Patient continues to express desire to leave and has no other questions or concerns.  Findings discussed with her.   Daleen Bo, MD 06/13/19 1816

## 2019-06-13 NOTE — Telephone Encounter (Signed)
Refill on baclofen. Please advise.

## 2019-06-13 NOTE — Discharge Instructions (Addendum)
Since your pain has improved and you are comfortable going home, it is safe to do that.  Make sure you follow-up with your treating physician for further medications to control your symptoms.  Do not double up on baclofen, as it can be dangerous and high doses.  You can always return here if needed for problems.

## 2019-06-13 NOTE — ED Provider Notes (Signed)
Emergency Department Provider Note   I have reviewed the triage vital signs and the nursing notes.   HISTORY  Chief Complaint Sickle Cell Pain Crisis   HPI Hannah Hawkins is a 33 y.o. female with PMH of sickle cell anemia with frequent crisis presentations presents to the emergency department for evaluation of sickle cell type pain in her thighs.  Patient having pain in bilateral thighs worse on the right and radiating to the hip.  She does have some very mild chest heaviness but pain is not pleuritic or exertional.  No sharp or crisis type pain.  No fevers or chills.  No shortness of breath.  Denies history of acute chest.  No unilateral weakness or numbness.  No headaches.    Past Medical History:  Diagnosis Date  . Sickle cell anemia [facility])     Patient Active Problem List   Diagnosis Date Noted  . GERD (gastroesophageal reflux disease) 05/29/2019  . History of cocaine use 05/18/2019  . Chronic pain syndrome   . Sickle cell crisis ([place]) 05/15/2019  . Cocaine use   . Sickle cell pain crisis ([place]) 11/14/2017  . Anemia of chronic disease 11/13/2017  . Sinusitis, chronic 11/13/2017  . Sickle cell anemia with crisis ([place]) 11/02/2017  . 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 EYE SURGERY  03/09/2017   left eye    Allergies Penicillins  Family History  Problem Relation Age of Onset  . Diabetes Father     Social History Social History   Tobacco Use  . Smoking status: Former Smoker    Types: Cigarettes  . Smokeless tobacco: Never Used  Substance Use Topics  . Alcohol use: Not Currently  . Drug use: No    Review of Systems  Constitutional: No fever/chills Eyes: No visual changes. ENT: No sore throat. Cardiovascular: Positive chest pain. Respiratory: Denies shortness of breath. Gastrointestinal: No abdominal pain. No nausea, no vomiting.  No diarrhea.  No constipation. Genitourinary: Negative  for dysuria. Musculoskeletal: Negative for back pain. Bilateral thigh pain worse on the right.  Skin: Negative for rash. Neurological: Negative for headaches, focal weakness or numbness.  10-point ROS otherwise negative.  ____________________________________________   PHYSICAL EXAM:  VITAL SIGNS: ED Triage Vitals  Enc Vitals Group     BP 06/13/19 1144 128/90     Pulse Rate 06/13/19 1144 89     Resp 06/13/19 1144 17     Temp 06/13/19 1144 98.6 F (37 C)     Temp src --      SpO2 06/13/19 1144 100 %     Pain Score 06/13/19 1150 10   Constitutional: Alert and oriented. Well appearing and in no acute distress. Eyes: Conjunctivae are normal.  Head: Atraumatic. Nose: No congestion/rhinnorhea. Mouth/Throat: Mucous membranes are moist.  Neck: No stridor.  Cardiovascular: Normal rate, regular rhythm. Good peripheral circulation. Grossly normal heart sounds.   Respiratory: Normal respiratory effort.  No retractions. Lungs CTAB. Gastrointestinal: No distention.  Musculoskeletal: No lower extremity tenderness nor edema. No gross deformities of extremities. Neurologic:  Normal speech and language.  Skin:  Skin is warm, dry and intact. No rash noted.  ____________________________________________   LABS (all labs ordered are listed, but only abnormal results are displayed)  Labs Reviewed  COMPREHENSIVE METABOLIC PANEL - Abnormal; Notable for the following components:      Result Value   Glucose, Bld 104 (*)    All other components within normal limits  CBC WITH DIFFERENTIAL/PLATELET - Abnormal; Notable for the following components:   Hemoglobin 11.4 (*)    HCT 31.6 (*)    MCV 79.8 (*)    MCHC 36.1 (*)    nRBC 3.5 (*)    All other components within normal limits  RETICULOCYTES - Abnormal; Notable for the following components:   Immature Retic Fract 40.5 (*)    All other components within normal limits  SARS CORONAVIRUS 2 (HOSPITAL ORDER, PERFORMED IN [facility])   I-STAT BETA HCG BLOOD, ED ([facility], [facility], [facility] ONLY)  I-STAT BETA HCG BLOOD, ED (NOT ORDERABLE)   ____________________________________________  RADIOLOGY  Dg Chest 2 View  Result Date: 06/13/2019 CLINICAL DATA:  Chest pain.  History of sickle cell. EXAM: CHEST - 2 VIEW COMPARISON:  11. FINDINGS: Mediastinum and hilar structures normal. Lungs are clear of acute infiltrates. No pleural effusion or pneumothorax. Heart size normal. No acute bony abnormality. IMPRESSION: No acute abnormality. Electronically Signed   By: Maisie Fushomas  Register   On: 06/13/2019 12:35    ____________________________________________   PROCEDURES  Procedure(s) performed:   Procedures  CRITICAL CARE Performed by: Maia PlanJoshua G Long Total critical care time: 35 minutes Critical care time was exclusive of separately billable procedures and treating other patients. Critical care was necessary to treat or prevent imminent or life-threatening deterioration. Critical care was time spent personally by me on the following activities: development of treatment plan with patient and/or surrogate as well as nursing, discussions with consultants, evaluation of patient's response to treatment, examination of patient, obtaining history from patient or surrogate, ordering and performing treatments and interventions, ordering and review of laboratory studies, ordering and review of radiographic studies, pulse oximetry and re-evaluation of patient's condition.  Alona BeneJoshua Long, MD Emergency Medicine  ____________________________________________   INITIAL IMPRESSION / ASSESSMENT AND PLAN / ED COURSE  Pertinent labs & imaging results that were available during my care of the patient were reviewed by me and considered in my medical decision making (see chart for details).   Patient presents to the emergency department with sickle cell crisis type pain with pain mostly in the thighs.  Does describe some mild chest heaviness.  Chest x-ray pending but  no vital sign abnormalities or historical features concerning for acute chest.  02:50 PM  Labs and chest x-ray reviewed.  No acute findings.  Patient has had 3 doses of Dilaudid here and continues to have significant pain in the thighs worse on the right.  She does not feel like her pain is well enough controlled to return home.  We will page the sickle cell team regarding admit.   Discussed patient's case with Sickle Cell service to request admission. Patient and family (if present) updated with plan. Care transferred to Sickle Cell service.  I reviewed all nursing notes, vitals, pertinent old records, EKGs, labs, imaging (as available).  ____________________________________________  FINAL CLINICAL IMPRESSION(S) / ED DIAGNOSES  Final diagnoses:  Sickle cell pain crisis (HCC)     MEDICATIONS GIVEN DURING THIS VISIT:  Medications  dextrose 5 %-0.45 % sodium chloride infusion ( Intravenous New Bag/Given 06/13/19 1250)  HYDROmorphone (DILAUDID) injection 2 mg (has no administration in time range)    Or  HYDROmorphone (DILAUDID) injection 2 mg (has no administration in time range)  ondansetron (ZOFRAN) injection 4 mg (has no administration in time range)  sodium chloride flush (NS) 0.9 % injection 3 mL (3 mLs Intravenous Given 06/13/19 1251)  HYDROmorphone (DILAUDID) injection 2 mg (2 mg Intravenous Given 06/13/19  1250)    Or  HYDROmorphone (DILAUDID) injection 2 mg ( Subcutaneous See Alternative 06/13/19 1250)  HYDROmorphone (DILAUDID) injection 2 mg (2 mg Intravenous Given 06/13/19 1337)    Or  HYDROmorphone (DILAUDID) injection 2 mg ( Subcutaneous See Alternative 06/13/19 1337)  HYDROmorphone (DILAUDID) injection 2 mg (2 mg Intravenous Given 06/13/19 1451)    Or  HYDROmorphone (DILAUDID) injection 2 mg ( Subcutaneous See Alternative 06/13/19 1451)    Note:  This document was prepared using Dragon voice recognition software and may include unintentional dictation errors.  Alona BeneJoshua Long,  MD Emergency Medicine    Long, Arlyss RepressJoshua G, MD 06/13/19 [phone]

## 2019-06-14 MED ORDER — BACLOFEN 5 MG PO TABS: 5.0000 mg | ORAL_TABLET | Freq: Every day | ORAL | 0 refills | Status: DC | PRN

## 2019-06-17 ENCOUNTER — Encounter (HOSPITAL_COMMUNITY): Payer: Self-pay

## 2019-06-17 ENCOUNTER — Emergency Department (HOSPITAL_COMMUNITY)
Admission: EM | Admit: 2019-06-17 | Discharge: 2019-06-18 | Disposition: A | Discharge status: Home / Self Care | Payer: Medicaid Other | Attending: Emergency Medicine | Admitting: Emergency Medicine

## 2019-06-17 ENCOUNTER — Other Ambulatory Visit: Payer: Self-pay

## 2019-06-17 DIAGNOSIS — Z87891 Personal history of nicotine dependence: Secondary | ICD-10-CM | POA: Diagnosis not present

## 2019-06-17 DIAGNOSIS — Z79899 Other long term (current) drug therapy: Secondary | ICD-10-CM | POA: Insufficient documentation

## 2019-06-17 DIAGNOSIS — D5700 Hb-SS disease with crisis, unspecified: Secondary | ICD-10-CM | POA: Insufficient documentation

## 2019-06-17 DIAGNOSIS — M797 Fibromyalgia: Secondary | ICD-10-CM | POA: Diagnosis present

## 2019-06-17 LAB — CBC WITH DIFFERENTIAL/PLATELET
Abs Immature Granulocytes: 0.01 10*3/uL (ref 0.00–0.07)
Basophils Absolute: 0.1 10*3/uL (ref 0.0–0.1)
Basophils Relative: 1 %
Eosinophils Absolute: 0.2 10*3/uL (ref 0.0–0.5)
Eosinophils Relative: 3 %
HCT: 31 % — ABNORMAL LOW (ref 36.0–46.0)
Hemoglobin: 11.3 g/dL — ABNORMAL LOW (ref 12.0–15.0)
Immature Granulocytes: 0 %
Lymphocytes Relative: 38 %
Lymphs Abs: 2.1 10*3/uL (ref 0.7–4.0)
MCH: 29.1 pg (ref 26.0–34.0)
MCHC: 36.5 g/dL — ABNORMAL HIGH (ref 30.0–36.0)
MCV: 79.9 fL — ABNORMAL LOW (ref 80.0–100.0)
Monocytes Absolute: 0.5 10*3/uL (ref 0.1–1.0)
Monocytes Relative: 9 %
Neutro Abs: 2.7 10*3/uL (ref 1.7–7.7)
Neutrophils Relative %: 49 %
Platelets: 414 10*3/uL — ABNORMAL HIGH (ref 150–400)
RBC: 3.88 MIL/uL (ref 3.87–5.11)
RDW: 14.5 % (ref 11.5–15.5)
WBC: 5.4 10*3/uL (ref 4.0–10.5)
nRBC: 2.8 % — ABNORMAL HIGH (ref 0.0–0.2)

## 2019-06-17 LAB — RETICULOCYTES
Immature Retic Fract: 36.3 % — ABNORMAL HIGH (ref 2.3–15.9)
RBC.: 3.88 MIL/uL (ref 3.87–5.11)
Retic Count, Absolute: 127.7 10*3/uL (ref 19.0–186.0)
Retic Ct Pct: 3.3 % — ABNORMAL HIGH (ref 0.4–3.1)

## 2019-06-17 LAB — COMPREHENSIVE METABOLIC PANEL
ALT: 12 U/L (ref 0–44)
AST: 16 U/L (ref 15–41)
Albumin: 3.9 g/dL (ref 3.5–5.0)
Alkaline Phosphatase: 49 U/L (ref 38–126)
Anion gap: 7 (ref 5–15)
BUN: 10 mg/dL (ref 6–20)
CO2: 25 mmol/L (ref 22–32)
Calcium: 9 mg/dL (ref 8.9–10.3)
Chloride: 109 mmol/L (ref 98–111)
Creatinine, Ser: 0.77 mg/dL (ref 0.44–1.00)
GFR calc Af Amer: >60 mL/min (ref >60)
GFR calc non Af Amer: >60 mL/min (ref >60)
Glucose, Bld: 110 mg/dL — ABNORMAL HIGH (ref 70–99)
Potassium: 3.4 mmol/L — ABNORMAL LOW (ref 3.5–5.1)
Sodium: 141 mmol/L (ref 135–145)
Total Bilirubin: 0.6 mg/dL (ref 0.3–1.2)
Total Protein: 7.1 g/dL (ref 6.5–8.1)

## 2019-06-17 LAB — I-STAT BETA HCG BLOOD, ED (MC, WL, AP ONLY): I-stat hCG, quantitative: <5 m[IU]/mL (ref <5)

## 2019-06-17 MED ORDER — HYDROMORPHONE HCL 2 MG/ML IJ SOLN
2.0000 mg | Freq: Once | INTRAMUSCULAR | Status: AC
  Administered 2019-06-17: 2 mg via INTRAVENOUS

## 2019-06-17 MED ORDER — HYDROMORPHONE HCL 2 MG/ML IJ SOLN
2.0000 mg | INTRAMUSCULAR | Status: AC | PRN
  Administered 2019-06-17 (×2): 2 mg via INTRAVENOUS
  Filled 2019-06-17 (×3): qty 1

## 2019-06-17 MED ORDER — PROMETHAZINE HCL 25 MG/ML IJ SOLN
12.5000 mg | Freq: Once | INTRAMUSCULAR | Status: AC
  Administered 2019-06-17: 12.5 mg via INTRAVENOUS
  Filled 2019-06-17: qty 1

## 2019-06-17 MED ORDER — KETAMINE HCL 50 MG/5ML IJ SOSY
0.3000 mg/kg | PREFILLED_SYRINGE | Freq: Once | INTRAMUSCULAR | Status: AC
  Administered 2019-06-17: 19 mg via INTRAVENOUS
  Filled 2019-06-17: qty 5

## 2019-06-17 MED ORDER — KETOROLAC TROMETHAMINE 15 MG/ML IJ SOLN
15.0000 mg | Freq: Once | INTRAMUSCULAR | Status: AC
  Administered 2019-06-17: 15 mg via INTRAVENOUS
  Filled 2019-06-17: qty 1

## 2019-06-17 MED ORDER — ACETAMINOPHEN 325 MG PO TABS
650.0000 mg | ORAL_TABLET | Freq: Once | ORAL | Status: AC
  Administered 2019-06-17: 650 mg via ORAL
  Filled 2019-06-17: qty 2

## 2019-06-17 MED ORDER — ONDANSETRON HCL 4 MG/2ML IJ SOLN
4.0000 mg | Freq: Once | INTRAMUSCULAR | Status: AC
  Administered 2019-06-17: 4 mg via INTRAVENOUS
  Filled 2019-06-17: qty 2

## 2019-06-17 NOTE — ED Triage Notes (Signed)
She c/o sickle cell pain crisis. She states she was recently released from hospital for sickle cell pain. She is ambulatory and in no distress.

## 2019-06-17 NOTE — ED Provider Notes (Signed)
Call from prior provider to evaluate after third dose of narcotic analgesia.  Patient states she is better and feels like she can go home.  She is concerned that in 2 or 3 days her pain might come back.  This is a recurrent problem for her by her report.  She states that her refill for oxycodone is not due until 07/02/2019.  I encouraged her to contact her medical provider at the sickle cell clinic, to discuss different treatment options.  She was agreeable to that and stated she would call them.   Daleen Bo, MD 06/17/19 [phone]

## 2019-06-17 NOTE — ED Provider Notes (Signed)
[place] DEPT Provider Note   CSN: [id number] Arrival date & time: 06/17/19  1850     History   Chief Complaint Chief Complaint  Patient presents with  . Sickle Cell Pain Crisis    HPI Hannah Hawkins is a 33 y.o. female.     HPI   33 year old female with body pain.  History of sickle cell anemia and also chronic pain syndrome.  She reports diffuse body pain.  Little relief with home medications.  No fevers or chills.  No acute respiratory complaints.  Some nausea.  Past Medical History:  Diagnosis Date  . Sickle cell anemia [facility])     Patient Active Problem List   Diagnosis Date Noted  . GERD (gastroesophageal reflux disease) 05/29/2019  . History of cocaine use 05/18/2019  . Chronic pain syndrome   . Sickle cell crisis ([place]) 05/15/2019  . Cocaine use   . Sickle cell pain crisis ([place]) 11/14/2017  . Anemia of chronic disease 11/13/2017  . Sinusitis, chronic 11/13/2017  . Sickle cell anemia with crisis ([place]) 11/02/2017  . 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 EYE SURGERY  03/09/2017   left eye     OB History   No obstetric history on file.      Home Medications    Prior to Admission medications   Medication Sig Start Date End Date Taking? Authorizing Provider  baclofen (LIORESAL) 10 MG tablet Take 1 tablet (10 mg total) by mouth 3 (three) times daily as needed for muscle spasms. 06/13/19  Yes Daleen Bo, MD  ergocalciferol (VITAMIN D2) 1.25 MG (50000 UT) capsule Take 1 capsule (50,000 Units total) by mouth once a week. 05/10/19  Yes Lanae Boast, FNP  hydroxyurea (HYDREA) 500 MG capsule TAKE 2 CAPSULES(1000 MG) BY MOUTH DAILY Patient taking differently: Take 1,000 mg by mouth daily.  05/07/19  Yes Lanae Boast, FNP  ibuprofen (ADVIL) 800 MG tablet TAKE 1 TABLET(800 MG) BY MOUTH EVERY 8 HOURS AS NEEDED FOR MODERATE PAIN Patient taking differently: Take 800 mg  by mouth every 8 (eight) hours as needed for moderate pain.  04/27/19  Yes Lanae Boast, FNP  levocetirizine (XYZAL) 5 MG tablet TAKE 1 TABLET(5 MG) BY MOUTH EVERY EVENING Patient taking differently: Take 5 mg by mouth daily as needed for allergies.  04/11/19  Yes Lanae Boast, FNP  NARCAN 4 MG/0.1ML LIQD nasal spray kit Place 4 mg into the nose once as needed (overdose).  12/22/17  Yes [provider]  ondansetron (ZOFRAN) 8 MG tablet Take 1 tablet (8 mg total) by mouth every 8 (eight) hours as needed for nausea or vomiting. 04/11/19  Yes Lanae Boast, FNP  oxyCODONE (ROXICODONE) 15 MG immediate release tablet Take 1 tablet (15 mg total) by mouth every 6 (six) hours as needed for up to 15 days for pain. 06/07/19 06/22/19 Yes Lanae Boast, FNP  pantoprazole (PROTONIX) 40 MG tablet Take 1 tablet (40 mg total) by mouth daily. 05/10/19  Yes Lanae Boast, FNP  promethazine (PHENERGAN) 12.5 MG tablet Take 1 tablet (12.5 mg total) by mouth every 4 (four) hours as needed for nausea or vomiting. 05/18/19  Yes Dorena Dew, FNP  baclofen 5 MG TABS Take 5 mg by mouth daily as needed for muscle spasms. Patient not taking: Reported on 06/17/2019 06/14/19   Lanae Boast, FNP  Lactobacillus Rhamnosus, GG, (CULTURELLE) CAPS Take 1 capsule by mouth daily. Take 1 cap po BID x  7 days and then decrease to once a day. Patient not taking: Reported on 05/28/2019 04/11/19   Lanae Boast, FNP    Family History Family History  Problem Relation Age of Onset  . Diabetes Father     Social History Social History   Tobacco Use  . Smoking status: Former Smoker    Types: Cigarettes  . Smokeless tobacco: Never Used  Substance Use Topics  . Alcohol use: Not Currently  . Drug use: No     Allergies   Penicillins   Review of Systems Review of Systems  All systems reviewed and negative, other than as noted in HPI. Physical Exam Updated Vital Signs BP 110/70   Pulse 92   Temp 98.2 F (36.8 C)  (Oral)   Resp 11   LMP 05/23/2019   SpO2 96%   Physical Exam Vitals signs and nursing note reviewed.  Constitutional:      General: She is not in acute distress.    Appearance: She is well-developed.  HENT:     Head: Normocephalic and atraumatic.  Eyes:     General:        Right eye: No discharge.        Left eye: No discharge.     Conjunctiva/sclera: Conjunctivae normal.  Neck:     Musculoskeletal: Neck supple.  Cardiovascular:     Rate and Rhythm: Normal rate and regular rhythm.     Heart sounds: Normal heart sounds. No murmur. No friction rub. No gallop.   Pulmonary:     Effort: Pulmonary effort is normal. No respiratory distress.     Breath sounds: Normal breath sounds.  Abdominal:     General: There is no distension.     Palpations: Abdomen is soft.     Tenderness: There is no abdominal tenderness.  Musculoskeletal:        General: No tenderness.  Skin:    General: Skin is warm and dry.  Neurological:     Mental Status: She is alert.  Psychiatric:        Behavior: Behavior normal.        Thought Content: Thought content normal.      ED Treatments / Results  Labs (all labs ordered are listed, but only abnormal results are displayed) Labs Reviewed  COMPREHENSIVE METABOLIC PANEL - Abnormal; Notable for the following components:      Result Value   Potassium 3.4 (*)    Glucose, Bld 110 (*)    All other components within normal limits  CBC WITH DIFFERENTIAL/PLATELET - Abnormal; Notable for the following components:   Hemoglobin 11.3 (*)    HCT 31.0 (*)    MCV 79.9 (*)    MCHC 36.5 (*)    Platelets 414 (*)    nRBC 2.8 (*)    All other components within normal limits  RETICULOCYTES - Abnormal; Notable for the following components:   Retic Ct Pct 3.3 (*)    Immature Retic Fract 36.3 (*)    All other components within normal limits  I-STAT BETA HCG BLOOD, ED ([facility], [facility], [facility] ONLY)    EKG    Radiology No results found.  Procedures Procedures (including  critical care time)  Medications Ordered in ED Medications  HYDROmorphone (DILAUDID) injection 2 mg (has no administration in time range)  ketamine 50 mg in normal saline 5 mL (10 mg/mL) syringe (19 mg Intravenous Given 06/17/19 2050)  ketorolac (TORADOL) 15 MG/ML injection 15 mg (15 mg Intravenous Given 06/17/19 2050)  acetaminophen (TYLENOL) tablet 650 mg (650 mg Oral Given 06/17/19 2056)  HYDROmorphone (DILAUDID) injection 2 mg (2 mg Intravenous Given 06/17/19 2153)  ondansetron (ZOFRAN) injection 4 mg (4 mg Intravenous Given 06/17/19 2050)  promethazine (PHENERGAN) injection 12.5 mg (12.5 mg Intravenous Given 06/17/19 2153)     Initial Impression / Assessment and Plan / ED Course  I have reviewed the triage vital signs and the nursing notes.  Pertinent labs & imaging results that were available during my care of the patient were reviewed by me and considered in my medical decision making (see chart for details).        33 year old female with diffuse body pain.  Hard to differentiate her chronic pain versus an acute vaso-occlusive crisis.  I doubt emergent complication of sickle cell anemia or otherwise.  She is afebrile.  No acute respiratory complaints.  Oxygen saturations are normal on room air.  Labs appear to be close to baseline.  Plan is to get her symptoms under adequate control.  Care transitioned to Dr Eulis Foster.   Final Clinical Impressions(s) / ED Diagnoses   Final diagnoses:  Sickle cell pain crisis [facility])    ED Discharge Orders    None       Virgel Manifold, MD 06/17/19 2235

## 2019-06-17 NOTE — ED Notes (Signed)
Patient vomited up her 2 tylenol. First episode of emesis, patient did complain of nausea, zofran given.

## 2019-06-19 ENCOUNTER — Other Ambulatory Visit: Payer: Self-pay

## 2019-06-19 DIAGNOSIS — D5700 Hb-SS disease with crisis, unspecified: Secondary | ICD-10-CM

## 2019-06-19 NOTE — Telephone Encounter (Signed)
Refill request for oxycodone 15mg 

## 2019-06-20 MED ORDER — OXYCODONE HCL 15 MG PO TABS: 15.0000 mg | ORAL_TABLET | Freq: Four times a day (QID) | ORAL | 0 refills | Status: DC | PRN

## 2019-06-25 ENCOUNTER — Other Ambulatory Visit: Payer: Self-pay | Admitting: Family Medicine

## 2019-06-25 ENCOUNTER — Other Ambulatory Visit: Payer: Self-pay

## 2019-06-25 DIAGNOSIS — D5720 Sickle-cell/Hb-C disease without crisis: Secondary | ICD-10-CM

## 2019-06-25 MED ORDER — BACLOFEN 5 MG PO TABS: 5.0000 mg | ORAL_TABLET | Freq: Every day | ORAL | 0 refills | Status: DC | PRN

## 2019-06-25 NOTE — Progress Notes (Signed)
Refilled

## 2019-07-05 ENCOUNTER — Encounter (HOSPITAL_COMMUNITY): Payer: Self-pay | Admitting: General Practice

## 2019-07-05 ENCOUNTER — Telehealth (HOSPITAL_COMMUNITY): Payer: Self-pay | Admitting: General Practice

## 2019-07-05 ENCOUNTER — Non-Acute Institutional Stay (HOSPITAL_COMMUNITY)
Admission: AD | Admit: 2019-07-05 | Discharge: 2019-07-05 | Disposition: A | Discharge status: Home / Self Care | Payer: Medicaid Other | Source: Ambulatory Visit | Attending: Internal Medicine | Admitting: Internal Medicine

## 2019-07-05 ENCOUNTER — Ambulatory Visit: Payer: Medicaid Other | Admitting: Family Medicine

## 2019-07-05 DIAGNOSIS — Z791 Long term (current) use of non-steroidal anti-inflammatories (NSAID): Secondary | ICD-10-CM | POA: Insufficient documentation

## 2019-07-05 DIAGNOSIS — Z79899 Other long term (current) drug therapy: Secondary | ICD-10-CM | POA: Diagnosis not present

## 2019-07-05 DIAGNOSIS — F1120 Opioid dependence, uncomplicated: Secondary | ICD-10-CM | POA: Insufficient documentation

## 2019-07-05 DIAGNOSIS — G894 Chronic pain syndrome: Secondary | ICD-10-CM | POA: Insufficient documentation

## 2019-07-05 DIAGNOSIS — M79606 Pain in leg, unspecified: Secondary | ICD-10-CM | POA: Insufficient documentation

## 2019-07-05 DIAGNOSIS — Z880 Allergy status to penicillin: Secondary | ICD-10-CM | POA: Insufficient documentation

## 2019-07-05 DIAGNOSIS — Z87891 Personal history of nicotine dependence: Secondary | ICD-10-CM | POA: Insufficient documentation

## 2019-07-05 DIAGNOSIS — D5700 Hb-SS disease with crisis, unspecified: Secondary | ICD-10-CM | POA: Diagnosis present

## 2019-07-05 DIAGNOSIS — M79603 Pain in arm, unspecified: Secondary | ICD-10-CM | POA: Insufficient documentation

## 2019-07-05 LAB — COMPREHENSIVE METABOLIC PANEL
ALT: 10 U/L (ref 0–44)
AST: 16 U/L (ref 15–41)
Albumin: 4 g/dL (ref 3.5–5.0)
Alkaline Phosphatase: 47 U/L (ref 38–126)
Anion gap: 8 (ref 5–15)
BUN: 11 mg/dL (ref 6–20)
CO2: 26 mmol/L (ref 22–32)
Calcium: 9 mg/dL (ref 8.9–10.3)
Chloride: 104 mmol/L (ref 98–111)
Creatinine, Ser: 0.67 mg/dL (ref 0.44–1.00)
GFR calc Af Amer: >60 mL/min (ref >60)
GFR calc non Af Amer: >60 mL/min (ref >60)
Glucose, Bld: 86 mg/dL (ref 70–99)
Potassium: 3.6 mmol/L (ref 3.5–5.1)
Sodium: 138 mmol/L (ref 135–145)
Total Bilirubin: 0.5 mg/dL (ref 0.3–1.2)
Total Protein: 7 g/dL (ref 6.5–8.1)

## 2019-07-05 LAB — CBC WITH DIFFERENTIAL/PLATELET
Abs Immature Granulocytes: 0.04 10*3/uL (ref 0.00–0.07)
Basophils Absolute: 0.1 10*3/uL (ref 0.0–0.1)
Basophils Relative: 1 %
Eosinophils Absolute: 0.4 10*3/uL (ref 0.0–0.5)
Eosinophils Relative: 3 %
HCT: 32.2 % — ABNORMAL LOW (ref 36.0–46.0)
Hemoglobin: 11.8 g/dL — ABNORMAL LOW (ref 12.0–15.0)
Immature Granulocytes: 0 %
Lymphocytes Relative: 29 %
Lymphs Abs: 3.8 10*3/uL (ref 0.7–4.0)
MCH: 29.2 pg (ref 26.0–34.0)
MCHC: 36.6 g/dL — ABNORMAL HIGH (ref 30.0–36.0)
MCV: 79.7 fL — ABNORMAL LOW (ref 80.0–100.0)
Monocytes Absolute: 1 10*3/uL (ref 0.1–1.0)
Monocytes Relative: 7 %
Neutro Abs: 8 10*3/uL — ABNORMAL HIGH (ref 1.7–7.7)
Neutrophils Relative %: 60 %
Platelets: 228 10*3/uL (ref 150–400)
RBC: 4.04 MIL/uL (ref 3.87–5.11)
RDW: 14.1 % (ref 11.5–15.5)
WBC: 13.3 10*3/uL — ABNORMAL HIGH (ref 4.0–10.5)
nRBC: 0.7 % — ABNORMAL HIGH (ref 0.0–0.2)

## 2019-07-05 LAB — RETICULOCYTES
Immature Retic Fract: 30.5 % — ABNORMAL HIGH (ref 2.3–15.9)
RBC.: 4.04 MIL/uL (ref 3.87–5.11)
Retic Count, Absolute: 81.6 10*3/uL (ref 19.0–186.0)
Retic Ct Pct: 2 % (ref 0.4–3.1)

## 2019-07-05 MED ORDER — ACETAMINOPHEN 500 MG PO TABS
1000.0000 mg | ORAL_TABLET | Freq: Once | ORAL | Status: AC
  Administered 2019-07-05: 1000 mg via ORAL
  Filled 2019-07-05: qty 2

## 2019-07-05 MED ORDER — KETOROLAC TROMETHAMINE 30 MG/ML IJ SOLN: 15.0000 mg | Freq: Once | INTRAMUSCULAR | Status: DC

## 2019-07-05 MED ORDER — SODIUM CHLORIDE 0.45 % IV SOLN
INTRAVENOUS | Status: DC
  Administered 2019-07-05: 11:00:00 via INTRAVENOUS

## 2019-07-05 MED ORDER — ONDANSETRON HCL 4 MG/2ML IJ SOLN
4.0000 mg | Freq: Four times a day (QID) | INTRAMUSCULAR | Status: DC | PRN
  Administered 2019-07-05: 4 mg via INTRAVENOUS
  Filled 2019-07-05: qty 2

## 2019-07-05 MED ORDER — DIPHENHYDRAMINE HCL 25 MG PO CAPS
25.0000 mg | ORAL_CAPSULE | ORAL | Status: DC | PRN
  Administered 2019-07-05: 25 mg via ORAL
  Filled 2019-07-05: qty 1

## 2019-07-05 MED ORDER — HYDROMORPHONE 1 MG/ML IV SOLN
INTRAVENOUS | Status: DC
  Administered 2019-07-05: 30 mg via INTRAVENOUS
  Administered 2019-07-05: 8.5 mg via INTRAVENOUS
  Filled 2019-07-05: qty 30

## 2019-07-05 MED ORDER — NALOXONE HCL 0.4 MG/ML IJ SOLN: 0.4000 mg | INTRAMUSCULAR | Status: DC | PRN

## 2019-07-05 MED ORDER — DIPHENHYDRAMINE HCL 12.5 MG/5ML PO ELIX
12.5000 mg | ORAL_SOLUTION | Freq: Once | ORAL | Status: AC
  Administered 2019-07-05: 12.5 mg via ORAL
  Filled 2019-07-05: qty 5

## 2019-07-05 MED ORDER — SODIUM CHLORIDE 0.9% FLUSH: 9.0000 mL | INTRAVENOUS | Status: DC | PRN

## 2019-07-05 NOTE — Discharge Summary (Signed)
[facility] Discharge Summary   Patient ID: Hannah Hawkins MRN: [id number] DOB/AGE: 09/03/1986 33 y.o.  Admit date: 07/05/2019 Discharge date: 07/05/2019  Primary Care Physician:  Lanae Boast, [place]  Admission Diagnoses:  Active Problems:   Sickle cell pain crisis [facility])   Discharge Medications:  Allergies as of 07/05/2019      Reactions   Penicillins Swelling, Rash   Has patient had a PCN reaction causing immediate rash, facial/tongue/throat swelling, SOB or lightheadedness with hypotension: Yes Has patient had a PCN reaction causing severe rash involving mucus membranes or skin necrosis: Yes Has patient had a PCN reaction that required hospitalization No Has patient had a PCN reaction occurring within the last 10 years: no If all of the above answers are "NO", then may proceed with Cephalosporin use.      Medication List    TAKE these medications   Baclofen 5 MG Tabs Take 5 mg by mouth daily as needed.   Culturelle Caps Take 1 capsule by mouth daily. Take 1 cap po BID x 7 days and then decrease to once a day.   ergocalciferol 1.25 MG (50000 UT) capsule Commonly known as: VITAMIN D2 Take 1 capsule (50,000 Units total) by mouth once a week.   hydroxyurea 500 MG capsule Commonly known as: HYDREA TAKE 2 CAPSULES(1000 MG) BY MOUTH DAILY What changed: See the new instructions.   ibuprofen 800 MG tablet Commonly known as: ADVIL TAKE 1 TABLET(800 MG) BY MOUTH EVERY 8 HOURS AS NEEDED FOR MODERATE PAIN What changed: See the new instructions.   levocetirizine 5 MG tablet Commonly known as: XYZAL TAKE 1 TABLET(5 MG) BY MOUTH EVERY EVENING What changed:   how much to take  how to take this  when to take this  reasons to take this  additional instructions   Narcan 4 MG/0.1ML Liqd nasal spray kit Generic drug: naloxone Place 4 mg into the nose once as needed (overdose).   ondansetron 8 MG tablet Commonly known as: Zofran Take 1 tablet (8 mg  total) by mouth every 8 (eight) hours as needed for nausea or vomiting.   oxyCODONE 15 MG immediate release tablet Commonly known as: ROXICODONE Take 1 tablet (15 mg total) by mouth every 6 (six) hours as needed for up to 15 days for pain.   pantoprazole 40 MG tablet Commonly known as: PROTONIX Take 1 tablet (40 mg total) by mouth daily.   promethazine 12.5 MG tablet Commonly known as: PHENERGAN Take 1 tablet (12.5 mg total) by mouth every 4 (four) hours as needed for nausea or vomiting.        Consults:  None  Significant Diagnostic Studies:  Dg Chest 2 View  Result Date: 06/13/2019 CLINICAL DATA:  Chest pain.  History of sickle cell. EXAM: CHEST - 2 VIEW COMPARISON:  11. FINDINGS: Mediastinum and hilar structures normal. Lungs are clear of acute infiltrates. No pleural effusion or pneumothorax. Heart size normal. No acute bony abnormality. IMPRESSION: No acute abnormality. Electronically Signed   By: Marcello Moores  Register   On: 06/13/2019 12:35    History of present illness:  Hannah Hawkins, a 33 year old female with a medical history significant for sickle cell disease, chronic pain syndrome, opiate dependence, history of anemia of chronic disease, allergic rhinitis, and history of polysubstance abuse presents complaining of upper and lower extremity pain that is consistent with typical sickle cell pain crisis.  Patient states that she awakened with increased pain 2 days ago.  She has been attempting to manage  at home without success.  Patient last had ibuprofen and a muscle relaxer this a.m. without sustained relief.  Patient states that she is currently out of oxycodone.  Current pain intensity 10/10 characterized as intermittent and aching, occasionally sharp. She denies fever, chills, recent travel, sick contacts or exposure to COVID-19.  Patient also denies headache, dizziness, paresthesias, shortness of breath, chest pain, dysuria, nausea, vomiting, or diarrhea.   [facility] Course: Patient admitted for management of sickle cell pain crisis.  Reviewed all laboratory values Hemoglobin 11.8, consistent with patient's baseline. WBCs 13.3.  Patient afebrile.  No signs of infection or inflammation.  Suspected to be reactive.  Pain managed with IV Dilaudid via PCA per weight-based protocol.  Settings of 0.5 mg, 10-minute lockout, and 3 mg/h.  Patient used a total of 8.5 mg. Pain intensity decreased to 5/10 Patient states that she can manage at home on previously prescribed medication regimen.  Patient is due for refills on opiate medications and her PCP is aware. Patient alert, oriented, and ambulating without assistance. Patient was discharged home in a hemodynamically stable condition.   Discharge instructions:  Resume all home medications.  Follow up with PCP as previously  scheduled.   Discussed the importance of drinking 64 ounces of water daily to  help prevent pain crises, it is important to drink plenty of water throughout the day. This is because dehydration of red blood cells may lead further sickling.   Avoid all stressors that precipitate sickle cell pain crisis.     The patient was given clear instructions to go to ER or return to medical center if symptoms do not improve, worsen or new problems develop.    Physical Exam at Discharge:  BP 129/74 (BP Location: Left Arm)   Pulse 64   Temp 97.9 F (36.6 C) (Oral)   LMP 06/24/2019   SpO2 97%  Physical Exam Constitutional:      Appearance: Normal appearance.  HENT:     Head: Normocephalic.     Nose: Nose normal.     Mouth/Throat:     Mouth: Mucous membranes are moist.     Pharynx: Oropharynx is clear.  Neck:     Musculoskeletal: Normal range of motion.  Cardiovascular:     Rate and Rhythm: Normal rate and regular rhythm.     Pulses: Normal pulses.  Pulmonary:     Effort: Pulmonary effort is normal.     Breath sounds: Normal breath sounds.  Abdominal:     General: Abdomen  is flat. Bowel sounds are normal.  Musculoskeletal: Normal range of motion.  Skin:    General: Skin is warm.  Neurological:     General: No focal deficit present.     Mental Status: She is alert. Mental status is at baseline.  Psychiatric:        Mood and Affect: Mood normal.        Thought Content: Thought content normal.        Judgment: Judgment normal.      Disposition at Discharge: Discharge disposition: 01-Home or Self Care       Discharge Orders: Discharge Instructions    Discharge patient   Complete by: As directed    Discharge disposition: 01-Home or Self Care   Discharge patient date: 07/05/2019      Condition at Discharge:   Stable  Time spent on Discharge:  Greater than 30 minutes.  Signed: Donia Pounds  APRN, MSN, FNP-C Patient [facility] [address] [place], [place] [address] [phone]  07/05/2019, 10:02 PM

## 2019-07-05 NOTE — H&P (Signed)
[facility] History and Physical   Date: 07/05/2019  Patient name: Hannah Hawkins Medical record number: [id number] Date of birth: 09/01/1986 Age: 33 y.o. Gender: female PCP: Lanae Boast, [place]  Attending physician: Tresa Garter, MD  Chief Complaint: Sickle cell pain  History of Present Illness: Hannah Hawkins, a 33 year old female with a medical history significant for sickle cell disease, chronic pain syndrome, opiate dependence, history of anemia of chronic disease, allergic rhinitis, and history of polysubstance abuse presents complaining of upper and lower extremity pain that is consistent with typical sickle cell pain crisis.  Patient states that she awakened with increased pain 2 days ago.  She has been attempting to manage at home without success.  Patient last had ibuprofen and a muscle relaxer this a.m. without sustained relief.  Patient states that she is currently out of oxycodone.  Current pain intensity 10/10 characterized as intermittent and aching, occasionally sharp. She denies fever, chills, recent travel, sick contacts or exposure to COVID-19.  Patient also denies headache, dizziness, paresthesias, shortness of breath, chest pain, dysuria, nausea, vomiting, or diarrhea.  Meds: Medications Prior to Admission  Medication Sig Dispense Refill Last Dose  . Baclofen 5 MG TABS Take 5 mg by mouth daily as needed. 30 tablet 0   . ergocalciferol (VITAMIN D2) 1.25 MG (50000 UT) capsule Take 1 capsule (50,000 Units total) by mouth once a week. 12 capsule 3   . hydroxyurea (HYDREA) 500 MG capsule TAKE 2 CAPSULES(1000 MG) BY MOUTH DAILY (Patient taking differently: Take 1,000 mg by mouth daily. ) 60 capsule 2   . ibuprofen (ADVIL) 800 MG tablet TAKE 1 TABLET(800 MG) BY MOUTH EVERY 8 HOURS AS NEEDED FOR MODERATE PAIN (Patient taking differently: Take 800 mg by mouth every 8 (eight) hours as needed for moderate pain. ) 30 tablet 5   . Lactobacillus Rhamnosus,  GG, (CULTURELLE) CAPS Take 1 capsule by mouth daily. Take 1 cap po BID x 7 days and then decrease to once a day. (Patient not taking: Reported on 05/28/2019) 45 capsule 0   . levocetirizine (XYZAL) 5 MG tablet TAKE 1 TABLET(5 MG) BY MOUTH EVERY EVENING (Patient taking differently: Take 5 mg by mouth daily as needed for allergies. ) 90 tablet 1   . NARCAN 4 MG/0.1ML LIQD nasal spray kit Place 4 mg into the nose once as needed (overdose).   0   . ondansetron (ZOFRAN) 8 MG tablet Take 1 tablet (8 mg total) by mouth every 8 (eight) hours as needed for nausea or vomiting. 30 tablet 1   . oxyCODONE (ROXICODONE) 15 MG immediate release tablet Take 1 tablet (15 mg total) by mouth every 6 (six) hours as needed for up to 15 days for pain. 60 tablet 0   . pantoprazole (PROTONIX) 40 MG tablet Take 1 tablet (40 mg total) by mouth daily. 30 tablet 3   . promethazine (PHENERGAN) 12.5 MG tablet Take 1 tablet (12.5 mg total) by mouth every 4 (four) hours as needed for nausea or vomiting. 30 tablet 0     Allergies: Penicillins Past Medical History:  Diagnosis Date  . Sickle cell anemia (HCC)    Past Surgical History:  Procedure Laterality Date  . EYE SURGERY  03/09/2017   left eye   Family History  Problem Relation Age of Onset  . Diabetes Father    Social History   Socioeconomic History  . Marital status: Single    Spouse name: Not on file  . Number of  children: Not on file  . Years of education: Not on file  . Highest education level: Not on file  Occupational History  . Not on file  Social Needs  . Financial resource strain: Not on file  . Food insecurity    Worry: Not on file    Inability: Not on file  . Transportation needs    Medical: Not on file    Non-medical: Not on file  Tobacco Use  . Smoking status: Former Smoker    Types: Cigarettes  . Smokeless tobacco: Never Used  Substance and Sexual Activity  . Alcohol use: Not Currently  . Drug use: No  . Sexual activity: Yes   Lifestyle  . Physical activity    Days per week: Not on file    Minutes per session: Not on file  . Stress: Not on file  Relationships  . Social Herbalist on phone: Not on file    Gets together: Not on file    Attends religious service: Not on file    Active member of club or organization: Not on file    Attends meetings of clubs or organizations: Not on file    Relationship status: Not on file  . Intimate partner violence    Fear of current or ex partner: Not on file    Emotionally abused: Not on file    Physically abused: Not on file    Forced sexual activity: Not on file  Other Topics Concern  . Not on file  Social History Narrative  . Not on file   Review of Systems  Constitutional: Negative.   HENT: Negative.   Respiratory: Negative for sputum production.   Cardiovascular: Negative.  Negative for chest pain.  Gastrointestinal: Negative for abdominal pain, nausea and vomiting.  Genitourinary: Negative for dysuria and frequency.  Musculoskeletal: Positive for back pain and joint pain.  Skin: Negative.   Neurological: Negative.   Psychiatric/Behavioral: Negative.     Physical Exam: Blood pressure 110/72, pulse 66, temperature 97.9 F (36.6 C), temperature source Oral, SpO2 97 %. Physical Exam Constitutional:      General: She is in acute distress.  HENT:     Mouth/Throat:     Mouth: Mucous membranes are moist.  Eyes:     General: Scleral icterus present.  Neck:     Musculoskeletal: Normal range of motion.  Cardiovascular:     Rate and Rhythm: Normal rate and regular rhythm.     Pulses: Normal pulses.  Pulmonary:     Effort: Pulmonary effort is normal.     Breath sounds: Normal breath sounds.  Abdominal:     General: Abdomen is flat. Bowel sounds are normal.  Musculoskeletal: Normal range of motion.  Skin:    General: Skin is warm.  Neurological:     General: No focal deficit present.     Mental Status: She is alert. Mental status is at  baseline.  Psychiatric:        Mood and Affect: Mood normal.        Behavior: Behavior normal.        Thought Content: Thought content normal.        Judgment: Judgment normal.      Lab results: No results found for this or any previous visit (from the past 24 hour(s)).  Imaging results:  No results found.   Assessment & Plan: Patient admitted to sickle cell day infusion center for management of pain crisis.  Patient is opiate  tolerant, but has been out of medications over the past several days.  Pain is been uncontrolled on ibuprofen and muscle relaxers. Patient will be admitted to the day infusion center for extended observationInitiate 0.45% saline at 100 ml/hour for cellular rehydration.Toradol 15 mg IV times one for inflammation.Tylenol 1000 mg times one. Start Dilaudid PCA High Concentration per weight based protocol.  Patient will be re-evaluated for pain intensity in the context of function and relationship to baseline as care progresses.If no significant pain relief, will transfer patient to inpatient services for a higher level of care.  Review CBC with differential, CMP,  and reticulocytes as results become available   Donia Pounds  APRN, MSN, FNP-C Patient [facility] [address] [place], [place] [address] [phone]   07/05/2019, 10:09 AM

## 2019-07-05 NOTE — Discharge Instructions (Signed)
Sickle Cell Anemia, Adult ° °Sickle cell anemia is a condition where your red blood cells are shaped like sickles. Red blood cells carry oxygen through the body. Sickle-shaped cells do not live as long as normal red blood cells. They also clump together and block blood from flowing through the blood vessels. This prevents the body from getting enough oxygen. Sickle cell anemia causes organ damage and pain. It also increases the risk of infection. °Follow these instructions at home: °Medicines °· Take over-the-counter and prescription medicines only as told by your doctor. °· If you were prescribed an antibiotic medicine, take it as told by your doctor. Do not stop taking the antibiotic even if you start to feel better. °· If you develop a fever, do not take medicines to lower the fever right away. Tell your doctor about the fever. °Managing pain, stiffness, and swelling °· Try these methods to help with pain: °? Use a heating pad. °? Take a warm bath. °? Distract yourself, such as by watching TV. °Eating and drinking °· Drink enough fluid to keep your pee (urine) clear or pale yellow. Drink more in hot weather and during exercise. °· Limit or avoid alcohol. °· Eat a healthy diet. Eat plenty of fruits, vegetables, whole grains, and lean protein. °· Take vitamins and supplements as told by your doctor. °Traveling °· When traveling, keep these with you: °? Your medical information. °? The names of your doctors. °? Your medicines. °· If you need to take an airplane, talk to your doctor first. °Activity °· Rest often. °· Avoid exercises that make your heart beat much faster, such as jogging. °General instructions °· Do not use products that have nicotine or tobacco, such as cigarettes and e-cigarettes. If you need help quitting, ask your doctor. °· Consider wearing a medical alert bracelet. °· Avoid being in high places (high altitudes), such as mountains. °· Avoid very hot or cold temperatures. °· Avoid places where the  temperature changes a lot. °· Keep all follow-up visits as told by your doctor. This is important. °Contact a doctor if: °· A joint hurts. °· Your feet or hands hurt or swell. °· You feel tired (fatigued). °Get help right away if: °· You have symptoms of infection. These include: °? Fever. °? Chills. °? Being very tired. °? Irritability. °? Poor eating. °? Throwing up (vomiting). °· You feel dizzy or faint. °· You have new stomach pain, especially on the left side. °· You have a an erection (priapism) that lasts more than 4 hours. °· You have numbness in your arms or legs. °· You have a hard time moving your arms or legs. °· You have trouble talking. °· You have pain that does not go away when you take medicine. °· You are short of breath. °· You are breathing fast. °· You have a long-term cough. °· You have pain in your chest. °· You have a bad headache. °· You have a stiff neck. °· Your stomach looks bloated even though you did not eat much. °· Your skin is pale. °· You suddenly cannot see well. °Summary °· Sickle cell anemia is a condition where your red blood cells are shaped like sickles. °· Follow your doctor's advice on ways to manage pain, food to eat, activities to do, and steps to take for safe travel. °· Get medical help right away if you have any signs of infection, such as a fever. °This information is not intended to replace advice given to you by   your health care provider. Make sure you discuss any questions you have with your health care provider. °Document Released: 09/26/2013 Document Revised: 03/30/2019 Document Reviewed: 01/11/2017 °Elsevier Patient Education © 2020 [employer] ° °

## 2019-07-05 NOTE — Telephone Encounter (Signed)
Patient called, complained of pain in the legs and arms rated at 10/10. Denied chest pain, fever, diarrhea, abdominal pain, nausea/vomitting. Screened negative for Covid-19 symptoms. Admitted to having means of transportation without driving self after treatment. Last took 800 mg of Ibuprofen at 05:00 today. Patient also stated her Oxycodone was due for a refill. RN checked with the PCP, presently, it's still early for a refill. Per provider, patient can come to the day hospital for treatment. Patient notified, verbalized understanding.

## 2019-07-05 NOTE — Progress Notes (Signed)
Patient admitted to the day hospital for treatment of sickle cell pain crisis. Patient reported pain rated 10/10 in the arms and legs. Patient placed on Dilaudid PCA, given IV Zofran, PO Benadryl capsule, PO benadryl elixir, PO Tylenol and hydrated with IV fluids. At discharge patient reported  pain at 5 /10. Discharge instructions given to patient. Patient alert, oriented and ambulatory at discharge.

## 2019-07-06 ENCOUNTER — Other Ambulatory Visit: Payer: Self-pay

## 2019-07-06 ENCOUNTER — Ambulatory Visit (INDEPENDENT_AMBULATORY_CARE_PROVIDER_SITE_OTHER): Payer: Medicaid Other | Admitting: Family Medicine

## 2019-07-06 ENCOUNTER — Encounter: Payer: Self-pay | Admitting: Family Medicine

## 2019-07-06 VITALS — BP 122/77 | HR 76 | Temp 98.5°F | Resp 16 | Ht 62.0 in | Wt 146.0 lb

## 2019-07-06 DIAGNOSIS — D5720 Sickle-cell/Hb-C disease without crisis: Secondary | ICD-10-CM | POA: Diagnosis not present

## 2019-07-06 DIAGNOSIS — H1033 Unspecified acute conjunctivitis, bilateral: Secondary | ICD-10-CM

## 2019-07-06 MED ORDER — POLYMYXIN B-TRIMETHOPRIM 10000-0.1 UNIT/ML-% OP SOLN: 1.0000 [drp] | Freq: Four times a day (QID) | OPHTHALMIC | 0 refills | Status: AC

## 2019-07-06 MED ORDER — OXYCODONE HCL ER 10 MG PO T12A: 10.0000 mg | EXTENDED_RELEASE_TABLET | Freq: Two times a day (BID) | ORAL | 0 refills | Status: DC

## 2019-07-06 MED ORDER — OXYCODONE HCL 15 MG PO TABS: 15.0000 mg | ORAL_TABLET | Freq: Four times a day (QID) | ORAL | 0 refills | Status: DC | PRN

## 2019-07-06 NOTE — Progress Notes (Signed)
[facility] SICKLE CELL CARE  SICKLE CELL ANEMIA FOLLOW UP VISIT PROVIDER: Mike GipAndre Lurlene Ronda, FNP    Subjective:   Hannah ConferBeatrice Hawkins  is a 33 y.o.  female who  has a past medical history of Sickle cell anemia (HCC). presents for a follow up for Sickle Cell Anemia. The patient has had 5 admissions in the past 6 months.  Pain regimen includes: Oxycodone 15mg  q6h and oxycontin 10 mg q12h   Patient reports compliance with all medications.  Patient denies side effects of medications.  She states that she is having discharge from bilateral eyes x 1 day. She has a history of allergies. Not currently taking xyzal or flonase. She states that the eyes are painful and she tried allergy eye drops without relief.    Review of Systems  Constitutional: Negative.   HENT: Positive for congestion.   Eyes: Positive for pain, discharge and redness.  Respiratory: Negative.   Cardiovascular: Negative.   Gastrointestinal: Negative.   Genitourinary: Negative.   Musculoskeletal: Negative.   Skin: Negative.   Neurological: Negative.   Psychiatric/Behavioral: Negative.     Objective:   Objective  BP 122/77 (BP Location: Right Arm, Patient Position: Sitting, Cuff Size: Normal)   Pulse 76   Temp 98.5 F (36.9 C) (Oral)   Resp 16   Ht 5\' 2"  (1.575 m)   Wt 146 lb (66.2 kg)   LMP 06/24/2019   SpO2 100%   BMI 26.70 kg/m   Wt Readings from Last 3 Encounters:  07/06/19 146 lb (66.2 kg)  06/07/19 143 lb (64.9 kg)  05/29/19 143 lb 8.3 oz (65.1 kg)     Physical Exam Vitals signs and nursing note reviewed.  Constitutional:      General: She is not in acute distress.    Appearance: Normal appearance.  HENT:     Head: Normocephalic and atraumatic.  Eyes:     General:        Right eye: Discharge present.        Left eye: Discharge present.    Extraocular Movements: Extraocular movements intact.     Conjunctiva/sclera:     Right eye: Right conjunctiva is injected.    Left eye: Left conjunctiva is injected.     Pupils: Pupils are equal, round, and reactive to light.   Cardiovascular:     Rate and Rhythm: Normal rate and regular rhythm.     Heart sounds: No murmur.  Pulmonary:     Effort: Pulmonary effort is normal.     Breath sounds: Normal breath sounds.  Musculoskeletal: Normal range of motion.  Skin:    General: Skin is warm and dry.  Neurological:     Mental Status: She is alert and oriented to person, place, and time.  Psychiatric:        Mood and Affect: Mood normal.        Behavior: Behavior normal.        Thought Content: Thought content normal.        Judgment: Judgment normal.      Assessment/Plan:   Assessment   Encounter Diagnoses  Name Primary?  . 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 Acute conjunctivitis of both eyes, unspecified acute conjunctivitis type      Plan  1. Sickle cell-hemoglobin C disease without crisis (HCC) - oxyCODONE (ROXICODONE) 15 MG immediate release tablet; Take 1 tablet (15 mg total) by mouth every 6 (six) hours as needed for up to 15  days for pain.  Dispense: 60 tablet; Refill: 0 - oxyCODONE (OXYCONTIN) 10 mg 12 hr tablet; Take 1 tablet (10 mg total) by mouth every 12 (twelve) hours.  Dispense: 60 tablet; Refill: 0  2. Acute conjunctivitis of both eyes, unspecified acute conjunctivitis type - trimethoprim-polymyxin b (POLYTRIM) ophthalmic solution; Place 1 drop into both eyes every 6 (six) hours for 7 days.  Dispense: 10 mL; Refill: 0   Return to care as scheduled and prn. Patient verbalized understanding and agreed with plan of care.   1. Sickle cell disease - Continue Hydrea   We discussed the need for good hydration, monitoring of hydration status, avoidance of heat, cold, stress, and infection triggers. We discussed the risks and benefits of Hydrea, including bone marrow suppression, the possibility of GI upset, skin ulcers, hair thinning, and teratogenicity. The patient  was reminded of the need to seek medical attention of any symptoms of bleeding, anemia, or infection. Continue folic acid 1 mg daily to prevent aplastic bone marrow crises.   2. Pulmonary evaluation - Patient denies severe recurrent wheezes, shortness of breath with exercise, or persistent cough. If these symptoms develop, pulmonary function tests with spirometry will be ordered, and if abnormal, plan on referral to Pulmonology for further evaluation.  3. Cardiac - Routine screening for pulmonary hypertension is not recommended.  4. Eye - High risk of proliferative retinopathy. Annual eye exam with retinal exam recommended to patient.  5. Immunization status -  Yearly influenza vaccination is recommended, as well as being up to date with Meningococcal and Pneumococcal vaccines.   6. Acute and chronic painful episodes - We discussed that pt is to receive Schedule II prescriptions only from [place]. Pt is also aware that the prescription history is available to [place] online through the [facility] CSRS. Controlled substance agreement signed. We reminded [name] that all patients receiving Schedule II narcotics must be seen for follow within one month of prescription being requested. We reviewed the terms of our pain agreement, including the need to keep medicines in a safe locked location away from children or pets, and the need to report excess sedation or constipation, measures to avoid constipation, and policies related to early refills and stolen prescriptions. According to the [place] Chronic Pain Initiative program, we have reviewed details related to analgesia, adverse effects, aberrant behaviors.  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  Not applicable at this time.  If this occurs will use Exjade for management.   8. Vitamin D deficiency - Drisdol 50,000 units weekly. Patient encouraged to take as prescribed.   The above recommendations are taken from the NIH Evidence-Based Management of Sickle Cell  Disease: Expert Panel Report, 20149.   [name], FNP-BC [facility] [address] [place], [place] [address] [phone]  This note has been created with Dragon speech recognition software and smart phrase technology. Any transcriptional errors are unintentional.

## 2019-07-06 NOTE — Patient Instructions (Signed)
Sickle Cell Anemia, Adult ° °Sickle cell anemia is a condition where your red blood cells are shaped like sickles. Red blood cells carry oxygen through the body. Sickle-shaped cells do not live as long as normal red blood cells. They also clump together and block blood from flowing through the blood vessels. This prevents the body from getting enough oxygen. Sickle cell anemia causes organ damage and pain. It also increases the risk of infection. °Follow these instructions at home: °Medicines °· Take over-the-counter and prescription medicines only as told by your doctor. °· If you were prescribed an antibiotic medicine, take it as told by your doctor. Do not stop taking the antibiotic even if you start to feel better. °· If you develop a fever, do not take medicines to lower the fever right away. Tell your doctor about the fever. °Managing pain, stiffness, and swelling °· Try these methods to help with pain: °? Use a heating pad. °? Take a warm bath. °? Distract yourself, such as by watching TV. °Eating and drinking °· Drink enough fluid to keep your pee (urine) clear or pale yellow. Drink more in hot weather and during exercise. °· Limit or avoid alcohol. °· Eat a healthy diet. Eat plenty of fruits, vegetables, whole grains, and lean protein. °· Take vitamins and supplements as told by your doctor. °Traveling °· When traveling, keep these with you: °? Your medical information. °? The names of your doctors. °? Your medicines. °· If you need to take an airplane, talk to your doctor first. °Activity °· Rest often. °· Avoid exercises that make your heart beat much faster, such as jogging. °General instructions °· Do not use products that have nicotine or tobacco, such as cigarettes and e-cigarettes. If you need help quitting, ask your doctor. °· Consider wearing a medical alert bracelet. °· Avoid being in high places (high altitudes), such as mountains. °· Avoid very hot or cold temperatures. °· Avoid places where the  temperature changes a lot. °· Keep all follow-up visits as told by your doctor. This is important. °Contact a doctor if: °· A joint hurts. °· Your feet or hands hurt or swell. °· You feel tired (fatigued). °Get help right away if: °· You have symptoms of infection. These include: °? Fever. °? Chills. °? Being very tired. °? Irritability. °? Poor eating. °? Throwing up (vomiting). °· You feel dizzy or faint. °· You have new stomach pain, especially on the left side. °· You have a an erection (priapism) that lasts more than 4 hours. °· You have numbness in your arms or legs. °· You have a hard time moving your arms or legs. °· You have trouble talking. °· You have pain that does not go away when you take medicine. °· You are short of breath. °· You are breathing fast. °· You have a long-term cough. °· You have pain in your chest. °· You have a bad headache. °· You have a stiff neck. °· Your stomach looks bloated even though you did not eat much. °· Your skin is pale. °· You suddenly cannot see well. °Summary °· Sickle cell anemia is a condition where your red blood cells are shaped like sickles. °· Follow your doctor's advice on ways to manage pain, food to eat, activities to do, and steps to take for safe travel. °· Get medical help right away if you have any signs of infection, such as a fever. °This information is not intended to replace advice given to you by   your health care provider. Make sure you discuss any questions you have with your health care provider. °Document Released: 09/26/2013 Document Revised: 03/30/2019 Document Reviewed: 01/11/2017 °Elsevier Patient Education © 2020 [employer] Inc. ° °

## 2019-07-10 ENCOUNTER — Other Ambulatory Visit: Payer: Self-pay | Admitting: Family Medicine

## 2019-07-10 ENCOUNTER — Ambulatory Visit: Payer: Medicaid Other | Admitting: Neurology

## 2019-07-10 ENCOUNTER — Encounter: Payer: Medicaid Other | Admitting: Neurology

## 2019-07-10 ENCOUNTER — Telehealth: Payer: Self-pay

## 2019-07-10 DIAGNOSIS — Z79891 Long term (current) use of opiate analgesic: Secondary | ICD-10-CM

## 2019-07-10 DIAGNOSIS — D5720 Sickle-cell/Hb-C disease without crisis: Secondary | ICD-10-CM

## 2019-07-10 MED ORDER — MORPHINE SULFATE ER 30 MG PO TBCR: 30.0000 mg | EXTENDED_RELEASE_TABLET | Freq: Two times a day (BID) | ORAL | 0 refills | Status: DC

## 2019-07-10 NOTE — Telephone Encounter (Signed)
Spoke with patient. See previous message.  

## 2019-07-10 NOTE — Telephone Encounter (Signed)
Hannah Hawkins,  This patient was recently prescribed Oxycontin for Long Term medication however we have attempted prior authorization 3 times will no success. [place] Medicaid is saying oxycontin is non preferred. Can you review and see if this can be changed to something preferred so we can try to help her get a long acting medication? On Preferred List Morphine ER and Fentanyl Patches are listed. Please advise in Andre's absence if possible. Thanks !

## 2019-07-10 NOTE — Telephone Encounter (Signed)
Called and advised patient of Change to morphine 30 mg every 12 hours. I have also submitted a piror authorization. Thanks!

## 2019-07-17 ENCOUNTER — Other Ambulatory Visit: Payer: Self-pay

## 2019-07-17 ENCOUNTER — Inpatient Hospital Stay (HOSPITAL_COMMUNITY)
Admission: EM | Admit: 2019-07-17 | Discharge: 2019-07-20 | DRG: 812 | Disposition: A | Discharge status: Home / Self Care | Payer: Medicaid Other | Attending: Internal Medicine | Admitting: Internal Medicine

## 2019-07-17 ENCOUNTER — Telehealth (HOSPITAL_COMMUNITY): Payer: Self-pay | Admitting: *Deleted

## 2019-07-17 ENCOUNTER — Encounter (HOSPITAL_COMMUNITY): Payer: Self-pay

## 2019-07-17 DIAGNOSIS — D5700 Hb-SS disease with crisis, unspecified: Secondary | ICD-10-CM | POA: Diagnosis present

## 2019-07-17 DIAGNOSIS — Z79891 Long term (current) use of opiate analgesic: Secondary | ICD-10-CM

## 2019-07-17 DIAGNOSIS — Z79899 Other long term (current) drug therapy: Secondary | ICD-10-CM

## 2019-07-17 DIAGNOSIS — Z87891 Personal history of nicotine dependence: Secondary | ICD-10-CM

## 2019-07-17 DIAGNOSIS — F1490 Cocaine use, unspecified, uncomplicated: Secondary | ICD-10-CM

## 2019-07-17 DIAGNOSIS — K219 Gastro-esophageal reflux disease without esophagitis: Secondary | ICD-10-CM | POA: Diagnosis present

## 2019-07-17 DIAGNOSIS — E876 Hypokalemia: Secondary | ICD-10-CM | POA: Diagnosis present

## 2019-07-17 DIAGNOSIS — G894 Chronic pain syndrome: Secondary | ICD-10-CM | POA: Diagnosis present

## 2019-07-17 DIAGNOSIS — Z20828 Contact with and (suspected) exposure to other viral communicable diseases: Secondary | ICD-10-CM | POA: Diagnosis present

## 2019-07-17 LAB — CBC WITH DIFFERENTIAL/PLATELET
Abs Immature Granulocytes: 0.02 10*3/uL (ref 0.00–0.07)
Basophils Absolute: 0.1 10*3/uL (ref 0.0–0.1)
Basophils Relative: 1 %
Eosinophils Absolute: 1 10*3/uL — ABNORMAL HIGH (ref 0.0–0.5)
Eosinophils Relative: 11 %
HCT: 26.6 % — ABNORMAL LOW (ref 36.0–46.0)
Hemoglobin: 9.8 g/dL — ABNORMAL LOW (ref 12.0–15.0)
Immature Granulocytes: 0 %
Lymphocytes Relative: 42 %
Lymphs Abs: 3.9 10*3/uL (ref 0.7–4.0)
MCH: 30.2 pg (ref 26.0–34.0)
MCHC: 36.8 g/dL — ABNORMAL HIGH (ref 30.0–36.0)
MCV: 82.1 fL (ref 80.0–100.0)
Monocytes Absolute: 1 10*3/uL (ref 0.1–1.0)
Monocytes Relative: 11 %
Neutro Abs: 3.3 10*3/uL (ref 1.7–7.7)
Neutrophils Relative %: 35 %
Platelets: 383 10*3/uL (ref 150–400)
RBC: 3.24 MIL/uL — ABNORMAL LOW (ref 3.87–5.11)
RDW: 15.7 % — ABNORMAL HIGH (ref 11.5–15.5)
WBC: 9.4 10*3/uL (ref 4.0–10.5)
nRBC: 11.8 % — ABNORMAL HIGH (ref 0.0–0.2)

## 2019-07-17 LAB — COMPREHENSIVE METABOLIC PANEL
ALT: 32 U/L (ref 0–44)
AST: 30 U/L (ref 15–41)
Albumin: 3.4 g/dL — ABNORMAL LOW (ref 3.5–5.0)
Alkaline Phosphatase: 48 U/L (ref 38–126)
Anion gap: 9 (ref 5–15)
BUN: 5 mg/dL — ABNORMAL LOW (ref 6–20)
CO2: 26 mmol/L (ref 22–32)
Calcium: 8.5 mg/dL — ABNORMAL LOW (ref 8.9–10.3)
Chloride: 105 mmol/L (ref 98–111)
Creatinine, Ser: 0.72 mg/dL (ref 0.44–1.00)
GFR calc Af Amer: >60 mL/min (ref >60)
GFR calc non Af Amer: >60 mL/min (ref >60)
Glucose, Bld: 86 mg/dL (ref 70–99)
Potassium: 3.4 mmol/L — ABNORMAL LOW (ref 3.5–5.1)
Sodium: 140 mmol/L (ref 135–145)
Total Bilirubin: 1 mg/dL (ref 0.3–1.2)
Total Protein: 6.4 g/dL — ABNORMAL LOW (ref 6.5–8.1)

## 2019-07-17 LAB — RETICULOCYTES
Immature Retic Fract: 37.3 % — ABNORMAL HIGH (ref 2.3–15.9)
RBC.: 3.24 MIL/uL — ABNORMAL LOW (ref 3.87–5.11)
Retic Count, Absolute: 170.1 10*3/uL (ref 19.0–186.0)
Retic Ct Pct: 5.3 % — ABNORMAL HIGH (ref 0.4–3.1)

## 2019-07-17 LAB — I-STAT BETA HCG BLOOD, ED (MC, WL, AP ONLY): I-stat hCG, quantitative: <5 m[IU]/mL (ref <5)

## 2019-07-17 LAB — SARS CORONAVIRUS 2 BY RT PCR (HOSPITAL ORDER, PERFORMED IN ~~LOC~~ HOSPITAL LAB): SARS Coronavirus 2: NEGATIVE

## 2019-07-17 MED ORDER — HYDROMORPHONE HCL 2 MG/ML IJ SOLN
2.0000 mg | INTRAMUSCULAR | Status: AC
  Administered 2019-07-17: 2 mg via INTRAVENOUS
  Filled 2019-07-17: qty 1

## 2019-07-17 MED ORDER — HYDROMORPHONE HCL 2 MG/ML IJ SOLN: 2.0000 mg | INTRAMUSCULAR | Status: AC

## 2019-07-17 MED ORDER — SODIUM CHLORIDE 0.45 % IV SOLN
INTRAVENOUS | Status: DC
  Administered 2019-07-17: 17:00:00 via INTRAVENOUS

## 2019-07-17 MED ORDER — POTASSIUM CHLORIDE CRYS ER 20 MEQ PO TBCR
20.0000 meq | EXTENDED_RELEASE_TABLET | Freq: Once | ORAL | Status: AC
  Administered 2019-07-18: 20 meq via ORAL
  Filled 2019-07-17: qty 1

## 2019-07-17 MED ORDER — ONDANSETRON HCL 4 MG/2ML IJ SOLN
4.0000 mg | INTRAMUSCULAR | Status: DC | PRN
  Administered 2019-07-17: 17:00:00 4 mg via INTRAVENOUS
  Filled 2019-07-17: qty 2

## 2019-07-17 MED ORDER — DIPHENHYDRAMINE HCL 25 MG PO CAPS
25.0000 mg | ORAL_CAPSULE | ORAL | Status: DC | PRN
  Administered 2019-07-17 (×2): 25 mg via ORAL
  Filled 2019-07-17: qty 1

## 2019-07-17 MED ORDER — HYDROMORPHONE HCL 2 MG/ML IJ SOLN
2.0000 mg | INTRAMUSCULAR | Status: AC
  Administered 2019-07-17: 20:00:00 2 mg via SUBCUTANEOUS
  Filled 2019-07-17: qty 1

## 2019-07-17 MED ORDER — KETOROLAC TROMETHAMINE 30 MG/ML IJ SOLN
30.0000 mg | Freq: Once | INTRAMUSCULAR | Status: AC
  Administered 2019-07-17: 30 mg via INTRAVENOUS
  Filled 2019-07-17: qty 1

## 2019-07-17 MED ORDER — SODIUM CHLORIDE 0.9% FLUSH
3.0000 mL | Freq: Once | INTRAVENOUS | Status: AC
  Administered 2019-07-17: 3 mL via INTRAVENOUS

## 2019-07-17 NOTE — ED Notes (Signed)
Dr. Myna Hidalgo made aware of pt declining IV access.

## 2019-07-17 NOTE — ED Triage Notes (Signed)
Patient c/o sickle cell crisis.   Patient states she is in pain all over her body and has swelling all over body. She says the swelling is usually from her sickle cell.   10/10 tightness/discomfort pain all over body    A/ox4 Ambulatory in triage Patient eating [employer]

## 2019-07-17 NOTE — Telephone Encounter (Signed)
Patient called asking for advice on whether she should report to the ED or the the day hospital. Patient reports generalized pain and swelling rated 10/10. Patient reports swelling in bilateral legs, arms and face. Reports difficulty making a fist due to swelling in hands. Denies fever, chest pain, nausea, vomiting, diarrhea and abdominal pain.  Hannah Hawkins, Velva notified and advised patient to go to the Emergency Room due to generalized swelling. Patient advised and expresses an understanding.

## 2019-07-17 NOTE — ED Notes (Addendum)
Lab consulted to obtain labs.

## 2019-07-17 NOTE — ED Notes (Signed)
Pt states she has had two doses of Dilaudid through her IV. MD notified. Received orders to provide pt with one more dose of pain medication and will see if pt's pain is controlled.

## 2019-07-17 NOTE — ED Notes (Signed)
Attempted to start an IV x 2 with no success. Patient prefers not to have an ultrasound guided IV. Will let Celeste, RN know this information.

## 2019-07-17 NOTE — ED Provider Notes (Addendum)
[place] DEPT Provider Note   CSN: [id number] Arrival date & time: 07/17/19  1207    History   Chief Complaint Chief Complaint  Patient presents with  . Sickle Cell Pain Crisis  . Body swelling    HPI Hannah Hawkins is a 33 y.o. female.     HPI   33yo female with history of SS, presents with concern for pain crisis. Woke up this AM with severe pain. Typical of prior crises, pain worse in bilateral feet, ankles, but notes pain also in lower and upper back, bilateral hands, wrists which is worse with movement. Has some swelling with prior pain crises but reports swelling all over now. No n/v/d, no fevers, no CP or dyspnea   Taking oxycodone and extended release morphine  Past Medical History:  Diagnosis Date  . Sickle cell anemia [facility])     Patient Active Problem List   Diagnosis Date Noted  . Hypokalemia 07/17/2019  . GERD (gastroesophageal reflux disease) 05/29/2019  . History of cocaine use 05/18/2019  . Chronic pain syndrome   . Sickle cell crisis ([place]) 05/15/2019  . Cocaine use   . Sickle cell pain crisis ([place]) 11/14/2017  . Anemia of chronic disease 11/13/2017  . Sinusitis, chronic 11/13/2017  . Sickle cell anemia with crisis ([place]) 11/02/2017  . 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 EYE SURGERY  03/09/2017   left eye     OB History   No obstetric history on file.      Home Medications    Prior to Admission medications   Medication Sig Start Date End Date Taking? Authorizing Provider  ergocalciferol (VITAMIN D2) 1.25 MG (50000 UT) capsule Take 1 capsule (50,000 Units total) by mouth once a week. 05/10/19  Yes Lanae Boast, FNP  hydroxyurea (HYDREA) 500 MG capsule TAKE 2 CAPSULES(1000 MG) BY MOUTH DAILY Patient taking differently: Take 1,000 mg by mouth daily.  05/07/19  Yes Lanae Boast, FNP  ibuprofen (ADVIL) 800 MG tablet TAKE 1 TABLET(800 MG) BY  MOUTH EVERY 8 HOURS AS NEEDED FOR MODERATE PAIN Patient taking differently: Take 800 mg by mouth every 8 (eight) hours as needed for moderate pain.  04/27/19  Yes Lanae Boast, FNP  levocetirizine (XYZAL) 5 MG tablet TAKE 1 TABLET(5 MG) BY MOUTH EVERY EVENING Patient taking differently: Take 5 mg by mouth daily as needed for allergies.  04/11/19  Yes Lanae Boast, FNP  morphine (MS CONTIN) 30 MG 12 hr tablet Take 1 tablet (30 mg total) by mouth every 12 (twelve) hours. 07/10/19  Yes Azzie Glatter, FNP  ondansetron (ZOFRAN) 4 MG tablet Take 4 mg by mouth every 4 (four) hours as needed for nausea/vomiting. 06/25/19  Yes [provider]  oxyCODONE (ROXICODONE) 15 MG immediate release tablet Take 1 tablet (15 mg total) by mouth every 6 (six) hours as needed for up to 15 days for pain. 07/06/19 07/21/19 Yes Lanae Boast, FNP  pantoprazole (PROTONIX) 40 MG tablet Take 1 tablet (40 mg total) by mouth daily. 05/10/19  Yes Lanae Boast, FNP  promethazine (PHENERGAN) 12.5 MG tablet Take 1 tablet (12.5 mg total) by mouth every 4 (four) hours as needed for nausea or vomiting. 05/18/19  Yes Dorena Dew, FNP  Baclofen 5 MG TABS Take 5 mg by mouth daily as needed. 06/25/19   Lanae Boast, FNP  Lactobacillus Rhamnosus, GG, (CULTURELLE) CAPS Take 1 capsule by mouth daily. Take 1 cap po BID  x 7 days and then decrease to once a day. Patient not taking: Reported on 05/28/2019 04/11/19   Lanae Boast, FNP  [facility] 4 MG/0.1ML LIQD nasal spray kit Place 4 mg into the nose once as needed (overdose).  12/22/17   [provider]    Family History Family History  Problem Relation Age of Onset  . Diabetes Father     Social History Social History   Tobacco Use  . Smoking status: Former Smoker    Types: Cigarettes  . Smokeless tobacco: Never Used  Substance Use Topics  . Alcohol use: Not Currently  . Drug use: No     Allergies   Penicillins   Review of Systems Review of Systems   Constitutional: Negative for fever.  HENT: Negative for sore throat.   Eyes: Negative for visual disturbance.  Respiratory: Negative for cough and shortness of breath.   Cardiovascular: Negative for chest pain.  Gastrointestinal: Negative for abdominal pain, nausea and vomiting.  Genitourinary: Negative for difficulty urinating.  Musculoskeletal: Positive for arthralgias and myalgias. Negative for back pain and neck pain.  Skin: Negative for rash.  Neurological: Negative for syncope and headaches.     Physical Exam Updated Vital Signs BP 100/64   Pulse 74   Temp 98.4 F (36.9 C) (Oral)   Resp 10   Ht '5\' 3"'  (1.6 m)   Wt 66.2 kg   LMP 07/15/2019   SpO2 98%   BMI 25.86 kg/m   Physical Exam Vitals signs and nursing note reviewed.  Constitutional:      General: She is not in acute distress.    Appearance: She is well-developed. She is not diaphoretic.  HENT:     Head: Normocephalic and atraumatic.  Eyes:     Conjunctiva/sclera: Conjunctivae normal.  Neck:     Musculoskeletal: Normal range of motion.  Cardiovascular:     Rate and Rhythm: Normal rate and regular rhythm.  Pulmonary:     Effort: Pulmonary effort is normal. No respiratory distress.     Breath sounds: Normal breath sounds.  Abdominal:     General: There is no distension.     Palpations: Abdomen is soft.     Tenderness: There is no abdominal tenderness. There is no guarding.  Musculoskeletal:        General: No tenderness. Swelling: mild swelling bilateral feet, bilateral hands.  Skin:    General: Skin is warm and dry.     Findings: No erythema or rash.  Neurological:     Mental Status: She is alert and oriented to person, place, and time.      ED Treatments / Results  Labs (all labs ordered are listed, but only abnormal results are displayed) Labs Reviewed  COMPREHENSIVE METABOLIC PANEL - Abnormal; Notable for the following components:      Result Value   Potassium 3.4 (*)    BUN 5 (*)     Calcium 8.5 (*)    Total Protein 6.4 (*)    Albumin 3.4 (*)    All other components within normal limits  CBC WITH DIFFERENTIAL/PLATELET - Abnormal; Notable for the following components:   RBC 3.24 (*)    Hemoglobin 9.8 (*)    HCT 26.6 (*)    MCHC 36.8 (*)    RDW 15.7 (*)    nRBC 11.8 (*)    Eosinophils Absolute 1.0 (*)    All other components within normal limits  RETICULOCYTES - Abnormal; Notable for the following components:   Retic  Ct Pct 5.3 (*)    RBC. 3.24 (*)    Immature Retic Fract 37.3 (*)    All other components within normal limits  SARS CORONAVIRUS 2 (HOSPITAL ORDER, [place] [facility])  I-STAT BETA HCG BLOOD, ED ([facility], [facility], [facility] ONLY)    EKG None  Radiology No results found.  Procedures .Critical Care Performed by: Gareth Morgan, MD Authorized by: Gareth Morgan, MD   Critical care provider statement:    Critical care time (minutes):  30   Critical care was time spent personally by me on the following activities:  Evaluation of patient's response to treatment, examination of patient, ordering and performing treatments and interventions, ordering and review of laboratory studies, pulse oximetry, re-evaluation of patient's condition, obtaining history from patient or surrogate and review of old charts   (including critical care time)  Medications Ordered in ED Medications  0.45 % sodium chloride infusion ( Intravenous New Bag/Given 07/18/19 0226)  morphine (MS CONTIN) 12 hr tablet 30 mg (30 mg Oral Given 07/18/19 1129)  hydroxyurea (HYDREA) capsule 1,000 mg (1,000 mg Oral Given 07/18/19 1127)  pantoprazole (PROTONIX) EC tablet 40 mg (40 mg Oral Given 07/18/19 1128)  senna-docusate (Senokot-S) tablet 1 tablet (1 tablet Oral Given 07/18/19 1128)  polyethylene glycol (MIRALAX / GLYCOLAX) packet 17 g (has no administration in time range)  naloxone (NARCAN) injection 0.4 mg (has no administration in time range)    And  sodium chloride flush (NS)  0.9 % injection 9 mL (has no administration in time range)  ketorolac (TORADOL) 15 MG/ML injection 15 mg (15 mg Intravenous Given 07/18/19 0648)  diphenhydrAMINE (BENADRYL) capsule 25 mg (has no administration in time range)    Or  diphenhydrAMINE (BENADRYL) 25 mg in sodium chloride 0.9 % 50 mL IVPB (has no administration in time range)  ondansetron (ZOFRAN) tablet 4 mg ( Oral See Alternative 07/18/19 0228)    Or  ondansetron (ZOFRAN) injection 4 mg (4 mg Intravenous Given 07/18/19 0228)  HYDROmorphone (DILAUDID) 1 mg/mL PCA injection ( Intravenous Hold 07/18/19 0505)  enoxaparin (LOVENOX) injection 40 mg (has no administration in time range)  sodium chloride flush (NS) 0.9 % injection 3 mL (3 mLs Intravenous Given 07/17/19 1714)  HYDROmorphone (DILAUDID) injection 2 mg (2 mg Intravenous Given 07/17/19 1834)    Or  HYDROmorphone (DILAUDID) injection 2 mg ( Subcutaneous See Alternative 07/17/19 1834)  HYDROmorphone (DILAUDID) injection 2 mg (2 mg Intravenous Given by Other 07/17/19 1945)    Or  HYDROmorphone (DILAUDID) injection 2 mg ( Subcutaneous See Alternative 07/17/19 1945)  HYDROmorphone (DILAUDID) injection 2 mg ( Intravenous See Alternative 07/17/19 1947)    Or  HYDROmorphone (DILAUDID) injection 2 mg (2 mg Subcutaneous Given 07/17/19 1947)  ketorolac (TORADOL) 30 MG/ML injection 30 mg (30 mg Intravenous Given 07/17/19 1702)  potassium chloride SA (K-DUR) CR tablet 20 mEq (20 mEq Oral Given 07/18/19 0222)  HYDROmorphone (DILAUDID) injection 1 mg (1 mg Intravenous Given 07/18/19 0508)     Initial Impression / Assessment and Plan / ED Course  I have reviewed the triage vital signs and the nursing notes.  Pertinent labs & imaging results that were available during my care of the patient were reviewed by me and considered in my medical decision making (see chart for details).        33yo female with history of Sickle Cell Disease chronic pain, presents with concern for pain crisis and  swelling.  Does not have significant edema on exam, no symptoms  to suggest severe new onset CHF exacerbation, no asymmetric swelling to suggest DVT. Albumin slightly low.  Received 3 doses of dilaudid and received toradol with continuing pain. Will admit for SS pain crisis.    Final Clinical Impressions(s) / ED Diagnoses   Final diagnoses:  Sickle cell pain crisis [facility])    ED Discharge Orders    None       Gareth Morgan, MD 07/18/19 1132    Gareth Morgan, MD 08/20/19 1159

## 2019-07-17 NOTE — ED Notes (Addendum)
IV team obtained IV,  did not obtain blood specimens. Pt states that she made IV team aware that blood draw was need, and was told by IV team that that they had already flushed the IV, IV team did not make RN aware blood specimen were not obtained.

## 2019-07-17 NOTE — ED Notes (Addendum)
IV team nurse states that she she did not see order for the blood work,

## 2019-07-17 NOTE — ED Notes (Signed)
IV team RN went to this writer to make aware that pt is denying new IV access. Rn went to talk to pt to advise that she needs an IV for her treatment plan and pt states "I have had enough for today. Please let me rest." RN will notify MD.

## 2019-07-17 NOTE — H&P (Signed)
History and Physical    Hannah Hawkins QQP:[id number] DOB: [birth year] DOA: 07/17/2019  PCP: Lanae Boast, FNP   Patient coming from: Home   Chief Complaint: Pain, swelling   HPI: Hannah Hawkins is a 33 y.o. female with medical history significant for sickle cell anemia, chronic pain, and cocaine abuse, presenting to emergency department for evaluation of pain and swelling.  Patient reports that she developed edema involving the bilateral hands, bilateral feet, and back several days ago, and while this has improved and nearly resolved, she woke with severe diffuse pain this morning.  She reports history of similar swelling associated with her pain crises.  Her current pain is similar to her typical crisis per her report, described as diffuse, "10/10," and not significantly alleviated with her home therapies.  She denies any fevers, chills, shortness of breath, chest pain, or cough.  ED Course: Upon arrival to the ED, patient is found to be afebrile, saturating well on room air, and with stable blood pressure.  Chemistry panel is notable for a potassium of 3.4.  CBC features a hemoglobin of 9.8.  Patient was given IV fluids, Dilaudid, and Toradol in the ED.  She continues to complain of severe pain and hospitalists are consulted for admission.  Review of Systems:  All other systems reviewed and apart from HPI, are negative.  Past Medical History:  Diagnosis Date  . Sickle cell anemia (HCC)     Past Surgical History:  Procedure Laterality Date  . EYE SURGERY  03/09/2017   left eye     reports that she has quit smoking. Her smoking use included cigarettes. She has never used smokeless tobacco. She reports previous alcohol use. She reports that she does not use drugs.  Allergies  Allergen Reactions  . Penicillins Swelling and Rash    Has patient had a PCN reaction causing immediate rash, facial/tongue/throat swelling, SOB or lightheadedness with hypotension: Yes Has patient had a  PCN reaction causing severe rash involving mucus membranes or skin necrosis: Yes Has patient had a PCN reaction that required hospitalization No Has patient had a PCN reaction occurring within the last 10 years: no If all of the above answers are "NO", then may proceed with Cephalosporin use.     Family History  Problem Relation Age of Onset  . Diabetes Father      Prior to Admission medications   Medication Sig Start Date End Date Taking? Authorizing Provider  ergocalciferol (VITAMIN D2) 1.25 MG (50000 UT) capsule Take 1 capsule (50,000 Units total) by mouth once a week. 05/10/19  Yes Lanae Boast, FNP  hydroxyurea (HYDREA) 500 MG capsule TAKE 2 CAPSULES(1000 MG) BY MOUTH DAILY Patient taking differently: Take 1,000 mg by mouth daily.  05/07/19  Yes Lanae Boast, FNP  ibuprofen (ADVIL) 800 MG tablet TAKE 1 TABLET(800 MG) BY MOUTH EVERY 8 HOURS AS NEEDED FOR MODERATE PAIN Patient taking differently: Take 800 mg by mouth every 8 (eight) hours as needed for moderate pain.  04/27/19  Yes Lanae Boast, FNP  levocetirizine (XYZAL) 5 MG tablet TAKE 1 TABLET(5 MG) BY MOUTH EVERY EVENING Patient taking differently: Take 5 mg by mouth daily as needed for allergies.  04/11/19  Yes Lanae Boast, FNP  morphine (MS CONTIN) 30 MG 12 hr tablet Take 1 tablet (30 mg total) by mouth every 12 (twelve) hours. 07/10/19  Yes Azzie Glatter, FNP  ondansetron (ZOFRAN) 4 MG tablet Take 4 mg by mouth every 4 (four) hours as needed for nausea/vomiting. 06/25/19  Yes [provider]  oxyCODONE (ROXICODONE) 15 MG immediate release tablet Take 1 tablet (15 mg total) by mouth every 6 (six) hours as needed for up to 15 days for pain. 07/06/19 07/21/19 Yes Lanae Boast, FNP  pantoprazole (PROTONIX) 40 MG tablet Take 1 tablet (40 mg total) by mouth daily. 05/10/19  Yes Lanae Boast, FNP  promethazine (PHENERGAN) 12.5 MG tablet Take 1 tablet (12.5 mg total) by mouth every 4 (four) hours as needed for nausea or  vomiting. 05/18/19  Yes Dorena Dew, FNP  Baclofen 5 MG TABS Take 5 mg by mouth daily as needed. 06/25/19   Lanae Boast, FNP  Lactobacillus Rhamnosus, GG, (CULTURELLE) CAPS Take 1 capsule by mouth daily. Take 1 cap po BID x 7 days and then decrease to once a day. Patient not taking: Reported on 05/28/2019 04/11/19   Lanae Boast, FNP  [facility] 4 MG/0.1ML LIQD nasal spray kit Place 4 mg into the nose once as needed (overdose).  12/22/17   [provider]    Physical Exam: Vitals:   07/17/19 1830 07/17/19 1900 07/17/19 1908 07/17/19 1930  BP: 100/88 120/82 120/82 121/75  Pulse: 79 73 74 (!) 59  Resp: 20 (!) 8 (!) 9 12  Temp:      TempSrc:      SpO2: 100% 98% 99% 99%  Weight:      Height:        Constitutional: NAD, calm  Eyes: PERTLA, lids and conjunctivae normal ENMT: Mucous membranes are moist. Posterior pharynx clear of any exudate or lesions.   Neck: normal, supple, no masses, no thyromegaly Respiratory: no wheezing, no crackles. Normal respiratory effort. No accessory muscle use.  Cardiovascular: S1 & S2 heard, regular rate and rhythm. Trace pedal edema.  Abdomen: No distension, no tenderness, soft. Bowel sounds normal.  Musculoskeletal: no clubbing / cyanosis. No joint deformity upper and lower extremities.    Skin: no significant rashes, lesions, ulcers. Warm, dry, well-perfused. Neurologic: CN 2-12 grossly intact. Sensation intact. Strength 5/5 in all 4 limbs.  Psychiatric: Alert and oriented x 3. Calm, cooperative.     Labs on Admission: I have personally reviewed following labs and imaging studies  CBC: Recent Labs  Lab 07/17/19 1808  WBC 9.4  NEUTROABS 3.3  HGB 9.8*  HCT 26.6*  MCV 82.1  PLT 664   Basic Metabolic Panel: Recent Labs  Lab 07/17/19 1808  NA 140  K 3.4*  CL 105  CO2 26  GLUCOSE 86  BUN 5*  CREATININE 0.72  CALCIUM 8.5*   GFR: Estimated Creatinine Clearance: 89.7 mL/min (by C-G formula based on SCr of 0.72 mg/dL). Liver  Function Tests: Recent Labs  Lab 07/17/19 1808  AST 30  ALT 32  ALKPHOS 48  BILITOT 1.0  PROT 6.4*  ALBUMIN 3.4*   No results for input(s): LIPASE, AMYLASE in the last 168 hours. No results for input(s): AMMONIA in the last 168 hours. Coagulation Profile: No results for input(s): INR, PROTIME in the last 168 hours. Cardiac Enzymes: No results for input(s): CKTOTAL, CKMB, CKMBINDEX, TROPONINI in the last 168 hours. BNP (last 3 results) No results for input(s): PROBNP in the last 8760 hours. HbA1C: No results for input(s): HGBA1C in the last 72 hours. CBG: No results for input(s): GLUCAP in the last 168 hours. Lipid Profile: No results for input(s): CHOL, HDL, LDLCALC, TRIG, CHOLHDL, LDLDIRECT in the last 72 hours. Thyroid Function Tests: No results for input(s): TSH, T4TOTAL, FREET4, T3FREE, THYROIDAB in the last  72 hours. Anemia Panel: Recent Labs    07/17/19 1808  RETICCTPCT 5.3*   Urine analysis:    Component Value Date/Time   COLORURINE YELLOW 05/15/2019 2301   APPEARANCEUR CLEAR 05/15/2019 2301   LABSPEC 1.008 05/15/2019 2301   PHURINE 8.0 05/15/2019 2301   GLUCOSEU NEGATIVE 05/15/2019 2301   HGBUR NEGATIVE 05/15/2019 2301   BILIRUBINUR NEGATIVE 05/15/2019 2301   BILIRUBINUR neg 05/10/2019 1222   KETONESUR NEGATIVE 05/15/2019 2301   PROTEINUR NEGATIVE 05/15/2019 2301   UROBILINOGEN 0.2 05/10/2019 1222   UROBILINOGEN 1.0 09/30/2017 1513   NITRITE NEGATIVE 05/15/2019 2301   LEUKOCYTESUR NEGATIVE 05/15/2019 2301   Sepsis Labs: '@LABRCNTIP' (procalcitonin:4,lacticidven:4) ) Recent Results (from the past 240 hour(s))  SARS Coronavirus 2 (CEPHEID - Performed in [place] hospital lab), Hosp Order     Status: None   Collection Time: 07/17/19  7:28 PM   Specimen: Nasopharyngeal Swab  Result Value Ref Range Status   SARS Coronavirus 2 NEGATIVE NEGATIVE Final    Comment: (NOTE) If result is NEGATIVE SARS-CoV-2 target nucleic acids are NOT DETECTED. The  SARS-CoV-2 RNA is generally detectable in upper and lower  respiratory specimens during the acute phase of infection. The lowest  concentration of SARS-CoV-2 viral copies this assay can detect is 250  copies / mL. A negative result does not preclude SARS-CoV-2 infection  and should not be used as the sole basis for treatment or other  patient management decisions.  A negative result may occur with  improper specimen collection / handling, submission of specimen other  than nasopharyngeal swab, presence of viral mutation(s) within the  areas targeted by this assay, and inadequate number of viral copies  (<250 copies / mL). A negative result must be combined with clinical  observations, patient history, and epidemiological information. If result is POSITIVE SARS-CoV-2 target nucleic acids are DETECTED. The SARS-CoV-2 RNA is generally detectable in upper and lower  respiratory specimens dur ing the acute phase of infection.  Positive  results are indicative of active infection with SARS-CoV-2.  Clinical  correlation with patient history and other diagnostic information is  necessary to determine patient infection status.  Positive results do  not rule out bacterial infection or co-infection with other viruses. If result is PRESUMPTIVE POSTIVE SARS-CoV-2 nucleic acids MAY BE PRESENT.   A presumptive positive result was obtained on the submitted specimen  and confirmed on repeat testing.  While 2019 novel coronavirus  (SARS-CoV-2) nucleic acids may be present in the submitted sample  additional confirmatory testing may be necessary for epidemiological  and / or clinical management purposes  to differentiate between  SARS-CoV-2 and other Sarbecovirus currently known to infect humans.  If clinically indicated additional testing with an alternate test  methodology [phone]) is advised. The SARS-CoV-2 RNA is generally  detectable in upper and lower respiratory sp ecimens during the acute   phase of infection. The expected result is Negative. Fact Sheet for Patients:  StrictlyIdeas.no Fact Sheet for Healthcare Providers: BankingDealers.co.za This test is not yet approved or cleared by the [place] FDA and has been authorized for detection and/or diagnosis of SARS-CoV-2 by FDA under an Emergency Use Authorization (EUA).  This EUA will remain in effect (meaning this test can be used) for the duration of the COVID-19 declaration under Section 564(b)(1) of the Act, 21 U.S.C. section 360bbb-3(b)(1), unless the authorization is terminated or revoked sooner. Performed at [facility], [place] [address], [place], [place] [address]      Radiological Exams  on Admission: No results found.  EKG: Not performed.   Assessment/Plan   1. Sickle cell anemia with pain crisis; chronic pain   - Presents with edema that has nearly resolved and severe pain similar to her prior sickle cell crises  - She continues to complain of severe pain after IVF, Toradol, and multiple doses IV Dilaudid in ED  - Continue IVF, continue long-acting morphine, hold Advil and schedule Toradol, start wt-based Dilaudid PCA     PPE: Mask, face shield  DVT prophylaxis: Lovenox  Code Status: Full  Family Communication: Discussed with patient  Consults called: None  Admission status: Inpatient     Vianne Bulls, MD [facility] Pager [phone]  If 7PM-7AM, please contact night-coverage [email or web address] Password [email or web address]  07/17/2019, 9:22 PM

## 2019-07-17 NOTE — ED Notes (Signed)
Patients IV is no longer patent. Put in consult for new IV with IV team per pt request. Fluids stopped at this time. Pt will not allow RN to remove current IV.

## 2019-07-18 DIAGNOSIS — E876 Hypokalemia: Secondary | ICD-10-CM

## 2019-07-18 LAB — RAPID URINE DRUG SCREEN, HOSP PERFORMED
Amphetamines: NOT DETECTED
Barbiturates: NOT DETECTED
Benzodiazepines: NOT DETECTED
Cocaine: POSITIVE — AB
Opiates: POSITIVE — AB
Tetrahydrocannabinol: NOT DETECTED

## 2019-07-18 MED ORDER — ENOXAPARIN SODIUM 40 MG/0.4ML ~~LOC~~ SOLN
40.0000 mg | SUBCUTANEOUS | Status: DC
  Administered 2019-07-18 – 2019-07-19 (×2): 40 mg via SUBCUTANEOUS
  Filled 2019-07-18 (×3): qty 0.4

## 2019-07-18 MED ORDER — ONDANSETRON HCL 4 MG/2ML IJ SOLN
4.0000 mg | INTRAMUSCULAR | Status: DC | PRN
  Administered 2019-07-18 – 2019-07-19 (×2): 4 mg via INTRAVENOUS
  Filled 2019-07-18 (×2): qty 2

## 2019-07-18 MED ORDER — HYDROMORPHONE HCL 1 MG/ML IJ SOLN
2.0000 mg | INTRAMUSCULAR | Status: DC | PRN
  Administered 2019-07-18: 2 mg via INTRAVENOUS
  Filled 2019-07-18: qty 2

## 2019-07-18 MED ORDER — SODIUM CHLORIDE 0.45 % IV SOLN
INTRAVENOUS | Status: AC
  Administered 2019-07-18: 02:00:00 via INTRAVENOUS

## 2019-07-18 MED ORDER — SENNOSIDES-DOCUSATE SODIUM 8.6-50 MG PO TABS
1.0000 | ORAL_TABLET | Freq: Two times a day (BID) | ORAL | Status: DC
  Administered 2019-07-18 – 2019-07-20 (×6): 1 via ORAL
  Filled 2019-07-18 (×6): qty 1

## 2019-07-18 MED ORDER — HYDROMORPHONE HCL 2 MG/ML IJ SOLN
2.0000 mg | INTRAMUSCULAR | Status: DC | PRN
  Administered 2019-07-18 (×2): 2 mg via INTRAVENOUS
  Filled 2019-07-18 (×2): qty 1

## 2019-07-18 MED ORDER — PANTOPRAZOLE SODIUM 40 MG PO TBEC
40.0000 mg | DELAYED_RELEASE_TABLET | Freq: Every day | ORAL | Status: DC
  Administered 2019-07-18 – 2019-07-20 (×3): 40 mg via ORAL
  Filled 2019-07-18 (×3): qty 1

## 2019-07-18 MED ORDER — NALOXONE HCL 0.4 MG/ML IJ SOLN: 0.4000 mg | INTRAMUSCULAR | Status: DC | PRN

## 2019-07-18 MED ORDER — HYDROXYUREA 500 MG PO CAPS
1000.0000 mg | ORAL_CAPSULE | Freq: Every day | ORAL | Status: DC
  Administered 2019-07-18 – 2019-07-20 (×3): 1000 mg via ORAL
  Filled 2019-07-18 (×3): qty 2

## 2019-07-18 MED ORDER — HYDROMORPHONE 1 MG/ML IV SOLN: INTRAVENOUS | Status: DC

## 2019-07-18 MED ORDER — SODIUM CHLORIDE 0.9 % IV SOLN
25.0000 mg | INTRAVENOUS | Status: DC | PRN
  Filled 2019-07-18: qty 0.5

## 2019-07-18 MED ORDER — MORPHINE SULFATE ER 30 MG PO TBCR
30.0000 mg | EXTENDED_RELEASE_TABLET | Freq: Two times a day (BID) | ORAL | Status: DC
  Administered 2019-07-18 – 2019-07-20 (×6): 30 mg via ORAL
  Filled 2019-07-18 (×3): qty 1
  Filled 2019-07-18: qty 2
  Filled 2019-07-18: qty 1
  Filled 2019-07-18: qty 2

## 2019-07-18 MED ORDER — ONDANSETRON HCL 4 MG PO TABS
4.0000 mg | ORAL_TABLET | ORAL | Status: DC | PRN
  Administered 2019-07-19: 4 mg via ORAL
  Filled 2019-07-18: qty 1

## 2019-07-18 MED ORDER — DIPHENHYDRAMINE HCL 25 MG PO CAPS: 25.0000 mg | ORAL_CAPSULE | ORAL | Status: DC | PRN

## 2019-07-18 MED ORDER — KETOROLAC TROMETHAMINE 15 MG/ML IJ SOLN
15.0000 mg | Freq: Four times a day (QID) | INTRAMUSCULAR | Status: DC
  Administered 2019-07-18 – 2019-07-20 (×10): 15 mg via INTRAVENOUS
  Filled 2019-07-18 (×10): qty 1

## 2019-07-18 MED ORDER — OXYCODONE HCL 5 MG PO TABS
15.0000 mg | ORAL_TABLET | ORAL | Status: DC | PRN
  Administered 2019-07-18: 15 mg via ORAL
  Filled 2019-07-18: qty 3

## 2019-07-18 MED ORDER — ENOXAPARIN SODIUM 40 MG/0.4ML ~~LOC~~ SOLN: 40.0000 mg | SUBCUTANEOUS | Status: DC

## 2019-07-18 MED ORDER — HYDROMORPHONE HCL 1 MG/ML IJ SOLN
1.0000 mg | INTRAMUSCULAR | Status: AC | PRN
  Administered 2019-07-18 (×2): 1 mg via INTRAVENOUS
  Filled 2019-07-18 (×2): qty 1

## 2019-07-18 MED ORDER — HYDROMORPHONE HCL 2 MG/ML IJ SOLN: 2.0000 mg | INTRAMUSCULAR | Status: DC | PRN

## 2019-07-18 MED ORDER — POLYETHYLENE GLYCOL 3350 17 G PO PACK: 17.0000 g | PACK | Freq: Every day | ORAL | Status: DC | PRN

## 2019-07-18 MED ORDER — SODIUM CHLORIDE 0.9% FLUSH: 9.0000 mL | INTRAVENOUS | Status: DC | PRN

## 2019-07-18 NOTE — ED Notes (Signed)
ED TO INPATIENT HANDOFF REPORT  Name/Age/Gender Hannah Hawkins 33 y.o. female  Code Status    Code Status Orders  (From admission, onward)         Start     Ordered   07/18/19 0105  Full code  Continuous     07/18/19 0104        Code Status History    Date Active Date Inactive Code Status Order ID Comments User Context   07/05/2019 1008 07/05/2019 2039 Full Code [id number]  Massie MaroonHollis, [name], FNP Inpatient   06/07/2019 1214 06/07/2019 2012 Full Code [id number]  Massie MaroonHollis, [name], FNP Inpatient   05/29/2019 0418 05/31/2019 1943 Full Code [id number]  Therisa Doyneoutova, Anastassia, MD Inpatient   05/15/2019 1825 05/18/2019 2134 Full Code [id number]  Pearson GrippeKim, James, MD ED   03/15/2019 1230 03/15/2019 2015 Full Code [id number]  Massie MaroonHollis, [name], FNP Inpatient   11/22/2018 1147 11/22/2018 1943 Full Code [id number]  Massie MaroonHollis, [name], FNP Inpatient   11/21/2018 1311 11/21/2018 2034 Full Code [id number]  Massie MaroonHollis, [name], FNP Inpatient   08/14/2018 1051 08/14/2018 2043 Full Code [id number]  Massie MaroonHollis, [name], FNP Inpatient   11/14/2017 0047 11/17/2017 1925 Full Code [id number]  Clydie BraunSmith, Rondell A, MD ED   11/02/2017 1046 11/05/2017 1751 Full Code [id number]  Rometta EmeryGarba, Mohammad L, MD Inpatient   10/13/2017 1021 10/13/2017 1547 Full Code [id number]  Massie MaroonHollis, [name], FNP Inpatient   Advance Care Planning Activity      Home/SNF/Other Home  Chief Complaint sickle cell   Level of Care/Admitting Diagnosis ED Disposition    ED Disposition Condition Comment   Admit  [facility] [[id number]]  Level of Care: Med-Surg [16]  Covid Evaluation: Confirmed COVID Negative  Diagnosis: Sickle cell pain crisis [facility](HCC) [[id number]]) [[id number]]  Admitting Physician: Briscoe DeutscherPYD, TIMOTHY S [[id number]]  Attending Physician: Briscoe DeutscherPYD, TIMOTHY S [[id number]]  Estimated length of stay: past midnight tomorrow  Certification:: I certify this patient will need inpatient services for at least 2 midnights  PT Class (Do Not Modify): Inpatient  [101]  PT Acc Code (Do Not Modify): Private [1]       Medical History Past Medical History:  Diagnosis Date  . Sickle cell anemia (HCC)     Allergies Allergies  Allergen Reactions  . Penicillins Swelling and Rash    Has patient had a PCN reaction causing immediate rash, facial/tongue/throat swelling, SOB or lightheadedness with hypotension: Yes Has patient had a PCN reaction causing severe rash involving mucus membranes or skin necrosis: Yes Has patient had a PCN reaction that required hospitalization No Has patient had a PCN reaction occurring within the last 10 years: no If all of the above answers are "NO", then may proceed with Cephalosporin use.     IV Location/Drains/Wounds Patient Lines/Drains/Airways Status   Active Line/Drains/Airways    Name:   Placement date:   Placement time:   Site:   Days:   Peripheral IV 07/17/19 Right;Anterior Forearm   07/17/19    1653    Forearm   1          Labs/Imaging Results for orders placed or performed during the hospital encounter of 07/17/19 (from the past 48 hour(s))  Comprehensive metabolic panel     Status: Abnormal   Collection Time: 07/17/19  6:08 PM  Result Value Ref Range   Sodium 140 135 - 145 mmol/L   Potassium 3.4 (L) 3.5 - 5.1 mmol/L   Chloride 105 98 - 111 mmol/L   CO2 26 22 -  32 mmol/L   Glucose, Bld 86 70 - 99 mg/dL   BUN 5 (L) 6 - 20 mg/dL   Creatinine, Ser 9.560.72 0.44 - 1.00 mg/dL   Calcium 8.5 (L) 8.9 - 10.3 mg/dL   Total Protein 6.4 (L) 6.5 - 8.1 g/dL   Albumin 3.4 (L) 3.5 - 5.0 g/dL   AST 30 15 - 41 U/L   ALT 32 0 - 44 U/L   Alkaline Phosphatase 48 38 - 126 U/L   Total Bilirubin 1.0 0.3 - 1.2 mg/dL   GFR calc non Af Amer >60 >60 mL/min   GFR calc Af Amer >60 >60 mL/min   Anion gap 9 5 - 15    Comment: Performed at [facility], [address] [place], [place] [address]  CBC with Differential     Status: Abnormal   Collection Time: 07/17/19  6:08 PM  Result Value Ref Range   WBC  9.4 4.0 - 10.5 K/uL    Comment: REPEATED TO VERIFY WHITE COUNT CONFIRMED ON SMEAR    RBC 3.24 (L) 3.87 - 5.11 MIL/uL   Hemoglobin 9.8 (L) 12.0 - 15.0 g/dL   HCT 65.726.6 (L) 84.636.0 - 96.246.0 %   MCV 82.1 80.0 - 100.0 fL   MCH 30.2 26.0 - 34.0 pg   MCHC 36.8 (H) 30.0 - 36.0 g/dL   RDW 95.215.7 (H) 84.111.5 - 32.415.5 %   Platelets 383 150 - 400 K/uL   nRBC 11.8 (H) 0.0 - 0.2 %   Neutrophils Relative % 35 %   Neutro Abs 3.3 1.7 - 7.7 K/uL   Lymphocytes Relative 42 %   Lymphs Abs 3.9 0.7 - 4.0 K/uL   Monocytes Relative 11 %   Monocytes Absolute 1.0 0.1 - 1.0 K/uL   Eosinophils Relative 11 %   Eosinophils Absolute 1.0 (H) 0.0 - 0.5 K/uL   Basophils Relative 1 %   Basophils Absolute 0.1 0.0 - 0.1 K/uL   Immature Granulocytes 0 %   Abs Immature Granulocytes 0.02 0.00 - 0.07 K/uL   Polychromasia PRESENT    Sickle Cells PRESENT    Target Cells PRESENT     Comment: Performed at [facility], [address] [place], [place] [address]  Reticulocytes     Status: Abnormal   Collection Time: 07/17/19  6:08 PM  Result Value Ref Range   Retic Ct Pct 5.3 (H) 0.4 - 3.1 %   RBC. 3.24 (L) 3.87 - 5.11 MIL/uL   Retic Count, Absolute 170.1 19.0 - 186.0 K/uL   Immature Retic Fract 37.3 (H) 2.3 - 15.9 %    Comment: Performed at [facility], [address] [place], [place] [address]  I-Stat beta hCG blood, ED     Status: None   Collection Time: 07/17/19  6:11 PM  Result Value Ref Range   I-stat hCG, quantitative <5.0 <5 mIU/mL   Comment 3            Comment:   GEST. AGE      CONC.  (mIU/mL)   <=1 WEEK        5 - 50     2 WEEKS       50 - 500     3 WEEKS       100 - 10,000     4 WEEKS     1,000 - 30,000        FEMALE AND NON-PREGNANT FEMALE:     LESS THAN 5  mIU/mL   SARS Coronavirus 2 (CEPHEID - Performed in [facility] hospital lab), Hosp Order     Status: None   Collection Time: 07/17/19  7:28 PM   Specimen: Nasopharyngeal Swab  Result Value Ref Range   SARS  Coronavirus 2 NEGATIVE NEGATIVE    Comment: (NOTE) If result is NEGATIVE SARS-CoV-2 target nucleic acids are NOT DETECTED. The SARS-CoV-2 RNA is generally detectable in upper and lower  respiratory specimens during the acute phase of infection. The lowest  concentration of SARS-CoV-2 viral copies this assay can detect is 250  copies / mL. A negative result does not preclude SARS-CoV-2 infection  and should not be used as the sole basis for treatment or other  patient management decisions.  A negative result may occur with  improper specimen collection / handling, submission of specimen other  than nasopharyngeal swab, presence of viral mutation(s) within the  areas targeted by this assay, and inadequate number of viral copies  (<250 copies / mL). A negative result must be combined with clinical  observations, patient history, and epidemiological information. If result is POSITIVE SARS-CoV-2 target nucleic acids are DETECTED. The SARS-CoV-2 RNA is generally detectable in upper and lower  respiratory specimens dur ing the acute phase of infection.  Positive  results are indicative of active infection with SARS-CoV-2.  Clinical  correlation with patient history and other diagnostic information is  necessary to determine patient infection status.  Positive results do  not rule out bacterial infection or co-infection with other viruses. If result is PRESUMPTIVE POSTIVE SARS-CoV-2 nucleic acids MAY BE PRESENT.   A presumptive positive result was obtained on the submitted specimen  and confirmed on repeat testing.  While 2019 novel coronavirus  (SARS-CoV-2) nucleic acids may be present in the submitted sample  additional confirmatory testing may be necessary for epidemiological  and / or clinical management purposes  to differentiate between  SARS-CoV-2 and other Sarbecovirus currently known to infect humans.  If clinically indicated additional testing with an alternate test  methodology  [phone] is advised. The SARS-CoV-2 RNA is generally  detectable in upper and lower respiratory sp ecimens during the acute  phase of infection. The expected result is Negative. Fact Sheet for Patients:  BoilerBrush.com.cyhttps://www.fda.gov/media/136312/download Fact Sheet for Healthcare Providers: https://pope.com/https://www.fda.gov/media/136313/download This test is not yet approved or cleared by the [place] FDA and has been authorized for detection and/or diagnosis of SARS-CoV-2 by FDA under an Emergency Use Authorization (EUA).  This EUA will remain in effect (meaning this test can be used) for the duration of the COVID-19 declaration under Section 564(b)(1) of the Act, 21 U.S.C. section 360bbb-3(b)(1), unless the authorization is terminated or revoked sooner. Performed at [facility], [address] [place], [place] [address]   Rapid urine drug screen (hospital performed)     Status: Abnormal   Collection Time: 07/18/19  1:13 PM  Result Value Ref Range   Opiates POSITIVE (A) NONE DETECTED   Cocaine POSITIVE (A) NONE DETECTED   Benzodiazepines NONE DETECTED NONE DETECTED   Amphetamines NONE DETECTED NONE DETECTED   Tetrahydrocannabinol NONE DETECTED NONE DETECTED   Barbiturates NONE DETECTED NONE DETECTED    Comment: (NOTE) DRUG SCREEN FOR MEDICAL PURPOSES ONLY.  IF CONFIRMATION IS NEEDED FOR ANY PURPOSE, NOTIFY LAB WITHIN 5 DAYS. LOWEST DETECTABLE LIMITS FOR URINE DRUG SCREEN Drug Class                     Cutoff (ng/mL) Amphetamine and metabolites  1000 Barbiturate and metabolites    200 Benzodiazepine                 562 Tricyclics and metabolites     300 Opiates and metabolites        300 Cocaine and metabolites        300 THC                            50 Performed at [facility], [place] [address] [place], [place] [address]    No results found.  Pending Labs Unresulted Labs (From admission, onward)    Start     Ordered   07/24/19 0500   Creatinine, serum  (enoxaparin (LOVENOX)    CrCl >/= 30 ml/min)  Weekly,   R    Comments: while on enoxaparin therapy    07/18/19 0104          Vitals/Pain Today's Vitals   07/18/19 1500 07/18/19 1517 07/18/19 1600 07/18/19 1800  BP: 119/72  108/70 112/87  Pulse: 63  63 61  Resp: 15  16 16   Temp:      TempSrc:      SpO2: 97%  98% 100%  Weight:      Height:      PainSc:  6       Isolation Precautions No active isolations  Medications Medications  0.45 % sodium chloride infusion ( Intravenous Stopped 07/18/19 1329)  morphine (MS CONTIN) 12 hr tablet 30 mg (30 mg Oral Given 07/18/19 1129)  hydroxyurea (HYDREA) capsule 1,000 mg (1,000 mg Oral Given 07/18/19 1127)  pantoprazole (PROTONIX) EC tablet 40 mg (40 mg Oral Given 07/18/19 1128)  senna-docusate (Senokot-S) tablet 1 tablet (1 tablet Oral Given 07/18/19 1128)  polyethylene glycol (MIRALAX / GLYCOLAX) packet 17 g (has no administration in time range)  ketorolac (TORADOL) 15 MG/ML injection 15 mg (15 mg Intravenous Given 07/18/19 1324)  diphenhydrAMINE (BENADRYL) capsule 25 mg (has no administration in time range)    Or  diphenhydrAMINE (BENADRYL) 25 mg in sodium chloride 0.9 % 50 mL IVPB (has no administration in time range)  ondansetron (ZOFRAN) tablet 4 mg ( Oral See Alternative 07/18/19 0228)    Or  ondansetron (ZOFRAN) injection 4 mg (4 mg Intravenous Given 07/18/19 0228)  enoxaparin (LOVENOX) injection 40 mg (40 mg Subcutaneous Given 07/18/19 1132)  oxyCODONE (Oxy IR/ROXICODONE) immediate release tablet 15 mg (has no administration in time range)  HYDROmorphone (DILAUDID) injection 2 mg (has no administration in time range)  sodium chloride flush (NS) 0.9 % injection 3 mL (3 mLs Intravenous Given 07/17/19 1714)  HYDROmorphone (DILAUDID) injection 2 mg (2 mg Intravenous Given 07/17/19 1834)    Or  HYDROmorphone (DILAUDID) injection 2 mg ( Subcutaneous See Alternative 07/17/19 1834)  HYDROmorphone (DILAUDID) injection 2 mg (2  mg Intravenous Given by Other 07/17/19 1945)    Or  HYDROmorphone (DILAUDID) injection 2 mg ( Subcutaneous See Alternative 07/17/19 1945)  HYDROmorphone (DILAUDID) injection 2 mg ( Intravenous See Alternative 07/17/19 1947)    Or  HYDROmorphone (DILAUDID) injection 2 mg (2 mg Subcutaneous Given 07/17/19 1947)  ketorolac (TORADOL) 30 MG/ML injection 30 mg (30 mg Intravenous Given 07/17/19 1702)  potassium chloride SA (K-DUR) CR tablet 20 mEq (20 mEq Oral Given 07/18/19 0222)  HYDROmorphone (DILAUDID) injection 1 mg (1 mg Intravenous Given 07/18/19 0508)    Mobility walks

## 2019-07-18 NOTE — ED Notes (Signed)
Pt called out for pain medication. RN went to advise pt that she can not get any pain medication without an IV access due to safety concerns and order parameters placed by MD. Pt requested to have a different nurse. Charge RN made aware.

## 2019-07-18 NOTE — Progress Notes (Addendum)
Subjective: Hannah Hawkins, a 33 year old female with a medical history significant for sickle cell disease, chronic pain syndrome, opiate dependence and history of cocaine use was admitted for sickle cell pain crisis.  Patient states that she was in her usual state of health until around 3 days ago.  She noticed swelling to her hands and feet.  Patient states that she is experienced swelling in the past with a sickle cell crisis.  Pain is primarily to upper and lower extremities characterized as constant and throbbing.  Current pain intensity is 9/10. Upon arrival, patient appears to be resting comfortably.  She endorses some swelling. She denies headache, chest pain, shortness of breath, dysuria, nausea, vomiting, or diarrhea.  Patient is afebrile and maintaining oxygen saturation above 90% on RA.   Objective:  Vital signs in last 24 hours:  Vitals:   07/18/19 1219 07/18/19 1220 07/18/19 1221 07/18/19 1222  BP:      Pulse: 72 69 70 73  Resp: 14 14 14 20   Temp:      TempSrc:      SpO2: 97% 97% 97% 98%  Weight:      Height:        Intake/Output from previous day:  No intake or output data in the 24 hours ending 07/18/19 1231  Physical Exam: General: Alert, awake, oriented x3, in no acute distress.  HEENT: [place]/AT PEERL, EOMI Neck: Trachea midline,  no masses, no thyromegal,y no JVD, no carotid bruit OROPHARYNX:  Moist, No exudate/ erythema/lesions.  Heart: Regular rate and rhythm, without murmurs, rubs, gallops, PMI non-displaced, no heaves or thrills on palpation.  Lungs: Clear to auscultation, no wheezing or rhonchi noted. No increased vocal fremitus resonant to percussion  Abdomen: Soft, nontender, nondistended, positive bowel sounds, no masses no hepatosplenomegaly noted..  Neuro: No focal neurological deficits noted cranial nerves II through XII grossly intact. DTRs 2+ bilaterally upper and lower extremities. Strength 5 out of 5 in bilateral upper and lower  extremities. Musculoskeletal: No warm swelling or erythema around joints, no spinal tenderness noted. Psychiatric: Patient alert and oriented x3, good insight and cognition, good recent to remote recall. Lymph node survey: No cervical axillary or inguinal lymphadenopathy noted.  Lab Results:  Basic Metabolic Panel:    Component Value Date/Time   NA 140 07/17/2019 1808   NA 139 05/10/2019 1222   K 3.4 (L) 07/17/2019 1808   CL 105 07/17/2019 1808   CO2 26 07/17/2019 1808   BUN 5 (L) 07/17/2019 1808   BUN 3 (L) 05/10/2019 1222   CREATININE 0.72 07/17/2019 1808   CREATININE 0.69 08/04/2017 1208   GLUCOSE 86 07/17/2019 1808   CALCIUM 8.5 (L) 07/17/2019 1808   CBC:    Component Value Date/Time   WBC 9.4 07/17/2019 1808   HGB 9.8 (L) 07/17/2019 1808   HGB 11.2 05/10/2019 1222   HCT 26.6 (L) 07/17/2019 1808   HCT 32.1 (L) 05/10/2019 1222   PLT 383 07/17/2019 1808   PLT 360 05/10/2019 1222   MCV 82.1 07/17/2019 1808   MCV 84 05/10/2019 1222   NEUTROABS 3.3 07/17/2019 1808   NEUTROABS 2.1 05/10/2019 1222   LYMPHSABS 3.9 07/17/2019 1808   LYMPHSABS 2.4 05/10/2019 1222   MONOABS 1.0 07/17/2019 1808   EOSABS 1.0 (H) 07/17/2019 1808   EOSABS 0.2 05/10/2019 1222   BASOSABS 0.1 07/17/2019 1808   BASOSABS 0.1 05/10/2019 1222    Recent Results (from the past 240 hour(s))  SARS Coronavirus 2 (CEPHEID - Performed in  [facility] hospital lab), Hosp Order     Status: None   Collection Time: 07/17/19  7:28 PM   Specimen: Nasopharyngeal Swab  Result Value Ref Range Status   SARS Coronavirus 2 NEGATIVE NEGATIVE Final    Comment: (NOTE) If result is NEGATIVE SARS-CoV-2 target nucleic acids are NOT DETECTED. The SARS-CoV-2 RNA is generally detectable in upper and lower  respiratory specimens during the acute phase of infection. The lowest  concentration of SARS-CoV-2 viral copies this assay can detect is 250  copies / mL. A negative result does not preclude SARS-CoV-2 infection  and  should not be used as the sole basis for treatment or other  patient management decisions.  A negative result may occur with  improper specimen collection / handling, submission of specimen other  than nasopharyngeal swab, presence of viral mutation(s) within the  areas targeted by this assay, and inadequate number of viral copies  (<250 copies / mL). A negative result must be combined with clinical  observations, patient history, and epidemiological information. If result is POSITIVE SARS-CoV-2 target nucleic acids are DETECTED. The SARS-CoV-2 RNA is generally detectable in upper and lower  respiratory specimens dur ing the acute phase of infection.  Positive  results are indicative of active infection with SARS-CoV-2.  Clinical  correlation with patient history and other diagnostic information is  necessary to determine patient infection status.  Positive results do  not rule out bacterial infection or co-infection with other viruses. If result is PRESUMPTIVE POSTIVE SARS-CoV-2 nucleic acids MAY BE PRESENT.   A presumptive positive result was obtained on the submitted specimen  and confirmed on repeat testing.  While 2019 novel coronavirus  (SARS-CoV-2) nucleic acids may be present in the submitted sample  additional confirmatory testing may be necessary for epidemiological  and / or clinical management purposes  to differentiate between  SARS-CoV-2 and other Sarbecovirus currently known to infect humans.  If clinically indicated additional testing with an alternate test  methodology [phone]) is advised. The SARS-CoV-2 RNA is generally  detectable in upper and lower respiratory sp ecimens during the acute  phase of infection. The expected result is Negative. Fact Sheet for Patients:  StrictlyIdeas.no Fact Sheet for Healthcare Providers: BankingDealers.co.za This test is not yet approved or cleared by the [place] FDA and has been  authorized for detection and/or diagnosis of SARS-CoV-2 by FDA under an Emergency Use Authorization (EUA).  This EUA will remain in effect (meaning this test can be used) for the duration of the COVID-19 declaration under Section 564(b)(1) of the Act, 21 U.S.C. section 360bbb-3(b)(1), unless the authorization is terminated or revoked sooner. Performed at [facility], [place] [address], [place], [place] [address]     Studies/Results: No results found.  Medications: Scheduled Meds: . enoxaparin (LOVENOX) injection  40 mg Subcutaneous Q24H  . HYDROmorphone   Intravenous Q4H  . hydroxyurea  1,000 mg Oral Daily  . ketorolac  15 mg Intravenous Q6H  . morphine  30 mg Oral Q12H  . pantoprazole  40 mg Oral Daily  . senna-docusate  1 tablet Oral BID   Continuous Infusions: . diphenhydrAMINE     PRN Meds:.diphenhydrAMINE **OR** diphenhydrAMINE, HYDROmorphone (DILAUDID) injection, naloxone **AND** sodium chloride flush, ondansetron **OR** ondansetron (ZOFRAN) IV, polyethylene glycol  Assessment/Plan: Principal Problem:   Sickle cell pain crisis (HCC) Active Problems:   Chronic pain syndrome   Hypokalemia  Sickle cell disease with pain crisis: Continue IV fluids 0.45 at [facility] Discontinued weight-based Dilaudid PCA and  Dilaudid per clinician assisted doses due to positive UDS. Oxycodone 15 mg by mouth every 4 hours while awake for moderate to severe pain Dilaudid 2 mg per subcutaneous route every 3 hours as needed for severe breakthrough pain Continue MS Contin 30 mg every 12 hours Reevaluate pain intensity in the context of functioning as pain intensity progresses to patient's baseline. Reevaluate pain regularly 2 L supplemental oxygen as needed  Chronic pain syndrome: Continue MS Contin and oxycodone  History of cocaine use: Reviewed urine drug screen, positive for cocaine.  Discontinued all IV pain medications. Patient previously denied cocaine use.  Recommend  alcohol and drug services at discharge. Social work consult   Code Status: Full Code Family Communication: N/A Disposition Plan: Not yet ready for discharge  Sonna Lipsky Rennis PettyMoore Sana Tessmer  APRN, MSN, FNP-C [facility] [address]  [place], [place] [address] [phone] If 5PM-7AM, please contact night-coverage.  07/18/2019, 12:31 PM  LOS: 1 day

## 2019-07-18 NOTE — ED Notes (Signed)
Pt ambulated to the restroom without assistance. Steady gait noted.  

## 2019-07-18 NOTE — ED Notes (Signed)
Pt given meal tray.

## 2019-07-18 NOTE — ED Notes (Signed)
Pt stated she is concerned that she sometimes jumps awake right when she's falling asleep

## 2019-07-19 ENCOUNTER — Telehealth: Payer: Self-pay

## 2019-07-19 DIAGNOSIS — D5720 Sickle-cell/Hb-C disease without crisis: Secondary | ICD-10-CM

## 2019-07-19 LAB — CBC
HCT: 27.2 % — ABNORMAL LOW (ref 36.0–46.0)
Hemoglobin: 10.2 g/dL — ABNORMAL LOW (ref 12.0–15.0)
MCH: 30.5 pg (ref 26.0–34.0)
MCHC: 37.5 g/dL — ABNORMAL HIGH (ref 30.0–36.0)
MCV: 81.4 fL (ref 80.0–100.0)
Platelets: 343 10*3/uL (ref 150–400)
RBC: 3.34 MIL/uL — ABNORMAL LOW (ref 3.87–5.11)
RDW: 15.5 % (ref 11.5–15.5)
WBC: 6.8 10*3/uL (ref 4.0–10.5)
nRBC: 14.2 % — ABNORMAL HIGH (ref 0.0–0.2)

## 2019-07-19 LAB — BASIC METABOLIC PANEL
Anion gap: 8 (ref 5–15)
BUN: 6 mg/dL (ref 6–20)
CO2: 27 mmol/L (ref 22–32)
Calcium: 8.6 mg/dL — ABNORMAL LOW (ref 8.9–10.3)
Chloride: 103 mmol/L (ref 98–111)
Creatinine, Ser: 0.73 mg/dL (ref 0.44–1.00)
GFR calc Af Amer: >60 mL/min (ref >60)
GFR calc non Af Amer: >60 mL/min (ref >60)
Glucose, Bld: 103 mg/dL — ABNORMAL HIGH (ref 70–99)
Potassium: 3.7 mmol/L (ref 3.5–5.1)
Sodium: 138 mmol/L (ref 135–145)

## 2019-07-19 MED ORDER — OXYCODONE HCL 5 MG PO TABS
15.0000 mg | ORAL_TABLET | ORAL | Status: DC
  Administered 2019-07-19 – 2019-07-20 (×8): 15 mg via ORAL
  Filled 2019-07-19 (×8): qty 3

## 2019-07-19 MED ORDER — HYDROMORPHONE HCL 1 MG/ML IJ SOLN
2.0000 mg | INTRAMUSCULAR | Status: DC | PRN
  Administered 2019-07-19 – 2019-07-20 (×5): 2 mg via SUBCUTANEOUS
  Filled 2019-07-19 (×5): qty 2

## 2019-07-19 MED ORDER — PROMETHAZINE HCL 25 MG PO TABS
12.5000 mg | ORAL_TABLET | ORAL | Status: DC | PRN
  Administered 2019-07-19: 15:00:00 12.5 mg via ORAL
  Filled 2019-07-19: qty 1

## 2019-07-19 NOTE — Progress Notes (Signed)
Subjective: Hannah Hawkins, a 33 year old female with a medical history significant for sickle cell disease, chronic pain syndrome, opiate dependence, history of cocaine use was admitted for sickle cell pain crisis.  Patient states that pain is improved some overnight.  Current pain intensity is 6-7/10.  Patient states that she cannot manage at home at current pain level.  Her goal is 4/10. Patient's pain is primarily to lower extremities, mostly right ankle.  She characterizes pain as intermittent and throbbing.  Patient afebrile and maintaining oxygen saturation above 90% on RA.  Patient denies headache, chest pain, shortness of breath, sore throat, persistent cough, dysuria, nausea, vomiting, or diarrhea.  Objective:  Vital signs in last 24 hours:  Vitals:   07/18/19 1855 07/18/19 2005 07/18/19 2345 07/19/19 0417  BP: (!) 143/83 118/79 99/60 108/65  Pulse: (!) 57 68 65 76  Resp: 18 16 17 16   Temp: 98.1 F (36.7 C) 98.4 F (36.9 C) 98.7 F (37.1 C) 98.4 F (36.9 C)  TempSrc: Oral Oral Oral Oral  SpO2: 98% 98% 96% 98%  Weight:      Height:        Intake/Output from previous day:   Intake/Output Summary (Last 24 hours) at 07/19/2019 1328 Last data filed at 07/18/2019 1329 Gross per 24 hour  Intake 1000 ml  Output -  Net 1000 ml    Lab Results:  Basic Metabolic Panel:    Component Value Date/Time   NA 138 07/19/2019 0927   NA 139 05/10/2019 1222   K 3.7 07/19/2019 0927   CL 103 07/19/2019 0927   CO2 27 07/19/2019 0927   BUN 6 07/19/2019 0927   BUN 3 (L) 05/10/2019 1222   CREATININE 0.73 07/19/2019 0927   CREATININE 0.69 08/04/2017 1208   GLUCOSE 103 (H) 07/19/2019 0927   CALCIUM 8.6 (L) 07/19/2019 0927   CBC:    Component Value Date/Time   WBC 6.8 07/19/2019 0927   HGB 10.2 (L) 07/19/2019 0927   HGB 11.2 05/10/2019 1222   HCT 27.2 (L) 07/19/2019 0927   HCT 32.1 (L) 05/10/2019 1222   PLT 343 07/19/2019 0927   PLT 360 05/10/2019 1222   MCV 81.4  07/19/2019 0927   MCV 84 05/10/2019 1222   NEUTROABS 3.3 07/17/2019 1808   NEUTROABS 2.1 05/10/2019 1222   LYMPHSABS 3.9 07/17/2019 1808   LYMPHSABS 2.4 05/10/2019 1222   MONOABS 1.0 07/17/2019 1808   EOSABS 1.0 (H) 07/17/2019 1808   EOSABS 0.2 05/10/2019 1222   BASOSABS 0.1 07/17/2019 1808   BASOSABS 0.1 05/10/2019 1222    Recent Results (from the past 240 hour(s))  SARS Coronavirus 2 (CEPHEID - Performed in [place] hospital lab), Hosp Order     Status: None   Collection Time: 07/17/19  7:28 PM   Specimen: Nasopharyngeal Swab  Result Value Ref Range Status   SARS Coronavirus 2 NEGATIVE NEGATIVE Final    Comment: (NOTE) If result is NEGATIVE SARS-CoV-2 target nucleic acids are NOT DETECTED. The SARS-CoV-2 RNA is generally detectable in upper and lower  respiratory specimens during the acute phase of infection. The lowest  concentration of SARS-CoV-2 viral copies this assay can detect is 250  copies / mL. A negative result does not preclude SARS-CoV-2 infection  and should not be used as the sole basis for treatment or other  patient management decisions.  A negative result may occur with  improper specimen collection / handling, submission of specimen other  than nasopharyngeal swab, presence of viral  mutation(s) within the  areas targeted by this assay, and inadequate number of viral copies  (<250 copies / mL). A negative result must be combined with clinical  observations, patient history, and epidemiological information. If result is POSITIVE SARS-CoV-2 target nucleic acids are DETECTED. The SARS-CoV-2 RNA is generally detectable in upper and lower  respiratory specimens dur ing the acute phase of infection.  Positive  results are indicative of active infection with SARS-CoV-2.  Clinical  correlation with patient history and other diagnostic information is  necessary to determine patient infection status.  Positive results do  not rule out bacterial infection or  co-infection with other viruses. If result is PRESUMPTIVE POSTIVE SARS-CoV-2 nucleic acids MAY BE PRESENT.   A presumptive positive result was obtained on the submitted specimen  and confirmed on repeat testing.  While 2019 novel coronavirus  (SARS-CoV-2) nucleic acids may be present in the submitted sample  additional confirmatory testing may be necessary for epidemiological  and / or clinical management purposes  to differentiate between  SARS-CoV-2 and other Sarbecovirus currently known to infect humans.  If clinically indicated additional testing with an alternate test  methodology [phone]([id number]) is advised. The SARS-CoV-2 RNA is generally  detectable in upper and lower respiratory sp ecimens during the acute  phase of infection. The expected result is Negative. Fact Sheet for Patients:  BoilerBrush.com.cyhttps://www.fda.gov/media/136312/download Fact Sheet for Healthcare Providers: https://pope.com/https://www.fda.gov/media/136313/download This test is not yet approved or cleared by the [place] FDA and has been authorized for detection and/or diagnosis of SARS-CoV-2 by FDA under an Emergency Use Authorization (EUA).  This EUA will remain in effect (meaning this test can be used) for the duration of the COVID-19 declaration under Section 564(b)(1) of the Act, 21 U.S.C. section 360bbb-3(b)(1), unless the authorization is terminated or revoked sooner. Performed at [facility], [address] [place], [place] [address]     Studies/Results: No results found.  Medications: Scheduled Meds: . enoxaparin (LOVENOX) injection  40 mg Subcutaneous Q24H  . hydroxyurea  1,000 mg Oral Daily  . ketorolac  15 mg Intravenous Q6H  . morphine  30 mg Oral Q12H  . oxyCODONE  15 mg Oral Q4H while awake  . pantoprazole  40 mg Oral Daily  . senna-docusate  1 tablet Oral BID   Continuous Infusions: PRN Meds:.HYDROmorphone (DILAUDID) injection, polyethylene glycol,  promethazine   Assessment/Plan: Principal Problem:   Sickle cell pain crisis (HCC) Active Problems:   Chronic pain syndrome   Hypokalemia  Sickle cell disease with pain crisis: Discontinued IV fluids Continue oxycodone 15 mg by mouth every 4 hours while awake Dilaudid 2 mg per subcutaneous route every 3 hours as needed for severe breakthrough pain.  Reevaluate pain scale regularly Monitor vital signs closely.  Chronic pain syndrome: Continue MS Contin 30 mg every 12 hours Oxycodone 15 mg every 4 hours as needed  Cocaine use: Patient and I discussed cocaine use at length.  Patient states that she is not addicted.  Patient states that she did a "bump" on last Friday and typically does socially.  She declines alcohol and drug services and social work consult. Code Status: Full Code Family Communication: N/A Disposition Plan: Not yet ready for discharge  Lachina Rennis PettyMoore Hollis  APRN, MSN, FNP-C [facility] [address]  [place], [place] [address] [phone] If 5PM-7AM, please contact night-coverage.  07/19/2019, 1:28 PM  LOS: 2 days

## 2019-07-20 ENCOUNTER — Ambulatory Visit: Payer: Medicaid Other | Admitting: Family Medicine

## 2019-07-20 ENCOUNTER — Telehealth: Payer: Self-pay

## 2019-07-20 MED ORDER — GABAPENTIN 100 MG PO CAPS
100.0000 mg | ORAL_CAPSULE | Freq: Three times a day (TID) | ORAL | Status: DC
  Administered 2019-07-20: 100 mg via ORAL
  Filled 2019-07-20: qty 1

## 2019-07-20 MED ORDER — OXYCODONE HCL 15 MG PO TABS: 15.0000 mg | ORAL_TABLET | Freq: Four times a day (QID) | ORAL | 0 refills | Status: DC | PRN

## 2019-07-20 MED ORDER — GABAPENTIN 100 MG PO CAPS: 100.0000 mg | ORAL_CAPSULE | Freq: Three times a day (TID) | ORAL | 0 refills | Status: DC

## 2019-07-20 NOTE — Telephone Encounter (Signed)
Called and spoke with patient, she was asking about medication prior authorization. This has been entered on [place] tracks and has a status of Approved. Thanks!

## 2019-07-20 NOTE — Discharge Summary (Signed)
Physician Discharge Summary  Hannah Hawkins QMG:[id number] DOB: [birth year] DOA: 07/17/2019  PCP: Lanae Boast, FNP  Admit date: 07/17/2019  Discharge date: 07/20/2019  Discharge Diagnoses:  Principal Problem:   Sickle cell pain crisis ([place]) Active Problems:   Cocaine use   Chronic pain syndrome   Hypokalemia   Discharge Condition: Stable  Disposition:  Follow-up Information    Lanae Boast, FNP Follow up in 2 week(s).   Specialty: Family Medicine Contact information: [place] [id number] [phone]          Pt is discharged home in good condition and is to follow up with Lanae Boast, FNP in 1 week to have labs evaluated. Hannah Hawkins is instructed to increase activity slowly and balance with rest for the next few days, and use prescribed medication to complete treatment of pain  Diet: Regular Wt Readings from Last 3 Encounters:  07/17/19 66.2 kg  07/06/19 66.2 kg  06/07/19 64.9 kg    History of present illness:  Hannah Hawkins, a 33 year old female with a medical history significant for sickle cell anemia, chronic pain syndrome, opiate dependence, and cocaine abuse presented to ER for evaluation of increased pain and swelling.  Patient reported that she developed edema involving the bilateral hands, bilateral feet, and back several days ago.  While this has improved, and nearly resolved, she awakened with severe diffuse pain this morning.  She reports history of similar swelling associated with her pain crises.  Current pain is similar to her typical sickle cell pain crisis per her report.  Pain characterized as diffuse, 10/10, and not significantly alleviated by home therapies.  Patient denies fever, chills, shortness of breath, chest pain, or cough.  ER course: Upon arrival to ER, patient was found to be afebrile, saturating well on room air, and stable blood pressure.  Chemistry panel notable for potassium of 3.4. CBC shows  hemoglobin of 9.8. Patient was given IV fluid, dilaudid, and toradol in ER without improvement.  Hospitalist consulted for admission.   Hospital Course:  Sickle cell disease with pain crisis: Patient was admitted for sickle cell pain crisis.  She was initially managed with IV Dilaudid via PCA.  Urine drug screen was found to be positive for cocaine.  All IV medications were discontinued.  Patient was managed with Dilaudid 2 mg subcu every 3 hours as needed for severe pain.  Home medications were initiated, oxycodone 15 mg every 4 hours as needed and MS Contin 30 mg every 12 hours.  Patient was also treated with IV Toradol as well as other adjunct therapies per sickle cell management protocols.  Gabapentin was added to medication regimen 100 mg 3 times per day.  Also, sent to patient's pharmacy.  Patient and I discussed medication at length, she expressed understanding of its use.  Patient has a history of cocaine use: She states that she last used cocaine 1 week ago.  She says I only did a "bump" and that was the first time in weeks.  Patient states that she uses cocaine from time to time to help with pain crisis.  She states that she is not addicted and does not need a social work consult or drug rehabilitation.  Patient was given information for alcohol and drug services of [place].  She declined any further discussion involving cocaine use.  All of patient's laboratory values are consistent with her baseline.  She is afebrile and maintaining oxygen saturation above 90% on RA.  She  is alert, oriented, and ambulating without assistance.  She was discharged home in a hemodynamically stable condition.   Discharge Exam: Vitals:   07/20/19 0806 07/20/19 1226  BP: 109/67 112/75  Pulse: (!) 55 (!) 58  Resp:  12  Temp: 98.8 F (37.1 C) 97.6 F (36.4 C)  SpO2: 99% 98%   Vitals:   07/20/19 0001 07/20/19 0407 07/20/19 0806 07/20/19 1226  BP: 100/60 (!) 113/99 109/67 112/75  Pulse: (!) 52 79 (!)  55 (!) 58  Resp: _0 Temp: 98.4 F (36.9 C) 99.3 F (37.4 C) 98.8 F (37.1 C) 97.6 F (36.4 C)  TempSrc:   Oral Oral  SpO2: 100% 98% 99% 98%  Weight:      Height:        Physical Exam Constitutional:      Appearance: Normal appearance.  HENT:     Mouth/Throat:     Mouth: Mucous membranes are moist.     Pharynx: Oropharynx is clear.  Eyes:     General: Scleral icterus present.     Pupils: Pupils are equal, round, and reactive to light.  Cardiovascular:     Rate and Rhythm: Normal rate and regular rhythm.     Pulses: Normal pulses.  Pulmonary:     Effort: Pulmonary effort is normal.     Breath sounds: Normal breath sounds.  Abdominal:     General: Abdomen is flat. Bowel sounds are normal.  Skin:    General: Skin is warm.  Neurological:     General: No focal deficit present.     Mental Status: She is alert. Mental status is at baseline.  Psychiatric:        Mood and Affect: Mood normal.        Behavior: Behavior normal.        Thought Content: Thought content normal.        Judgment: Judgment normal.     Discharge Instructions  Discharge Instructions    Discharge patient   Complete by: As directed    Discharge disposition: 01-Home or Self Care   Discharge patient date: 07/20/2019     Allergies as of 07/20/2019      Reactions   Penicillins Swelling, Rash   Has patient had a PCN reaction causing immediate rash, facial/tongue/throat swelling, SOB or lightheadedness with hypotension: Yes Has patient had a PCN reaction causing severe rash involving mucus membranes or skin necrosis: Yes Has patient had a PCN reaction that required hospitalization No Has patient had a PCN reaction occurring within the last 10 years: no If all of the above answers are "NO", then may proceed with Cephalosporin use.      Medication List    TAKE these medications   Baclofen 5 MG Tabs Take 5 mg by mouth daily as needed.   Culturelle Caps Take 1 capsule by mouth daily. Take  1 cap po BID x 7 days and then decrease to once a day.   ergocalciferol 1.25 MG (50000 UT) capsule Commonly known as: VITAMIN D2 Take 1 capsule (50,000 Units total) by mouth once a week.   gabapentin 100 MG capsule Commonly known as: NEURONTIN Take 1 capsule (100 mg total) by mouth 3 (three) times daily.   hydroxyurea 500 MG capsule Commonly known as: HYDREA TAKE 2 CAPSULES(1000 MG) BY MOUTH DAILY What changed: See the new instructions.   ibuprofen 800 MG tablet Commonly known as: ADVIL TAKE 1 TABLET(800 MG) BY MOUTH EVERY 8 HOURS AS NEEDED FOR  MODERATE PAIN What changed: See the new instructions.   levocetirizine 5 MG tablet Commonly known as: XYZAL TAKE 1 TABLET(5 MG) BY MOUTH EVERY EVENING What changed:   how much to take  how to take this  when to take this  reasons to take this  additional instructions   morphine 30 MG 12 hr tablet Commonly known as: MS CONTIN Take 1 tablet (30 mg total) by mouth every 12 (twelve) hours.   Narcan 4 MG/0.1ML Liqd nasal spray kit Generic drug: naloxone Place 4 mg into the nose once as needed (overdose).   ondansetron 4 MG tablet Commonly known as: ZOFRAN Take 4 mg by mouth every 4 (four) hours as needed for nausea/vomiting.   oxyCODONE 15 MG immediate release tablet Commonly known as: ROXICODONE Take 1 tablet (15 mg total) by mouth every 6 (six) hours as needed for up to 15 days for pain.   pantoprazole 40 MG tablet Commonly known as: PROTONIX Take 1 tablet (40 mg total) by mouth daily.   promethazine 12.5 MG tablet Commonly known as: PHENERGAN Take 1 tablet (12.5 mg total) by mouth every 4 (four) hours as needed for nausea or vomiting.       The results of significant diagnostics from this hospitalization (including imaging, microbiology, ancillary and laboratory) are listed below for reference.    Significant Diagnostic Studies: No results found.  Microbiology: Recent Results (from the past 240 hour(s))  SARS  Coronavirus 2 (CEPHEID - Performed in [place] hospital lab), Hosp Order     Status: None   Collection Time: 07/17/19  7:28 PM   Specimen: Nasopharyngeal Swab  Result Value Ref Range Status   SARS Coronavirus 2 NEGATIVE NEGATIVE Final    Comment: (NOTE) If result is NEGATIVE SARS-CoV-2 target nucleic acids are NOT DETECTED. The SARS-CoV-2 RNA is generally detectable in upper and lower  respiratory specimens during the acute phase of infection. The lowest  concentration of SARS-CoV-2 viral copies this assay can detect is 250  copies / mL. A negative result does not preclude SARS-CoV-2 infection  and should not be used as the sole basis for treatment or other  patient management decisions.  A negative result may occur with  improper specimen collection / handling, submission of specimen other  than nasopharyngeal swab, presence of viral mutation(s) within the  areas targeted by this assay, and inadequate number of viral copies  (<250 copies / mL). A negative result must be combined with clinical  observations, patient history, and epidemiological information. If result is POSITIVE SARS-CoV-2 target nucleic acids are DETECTED. The SARS-CoV-2 RNA is generally detectable in upper and lower  respiratory specimens dur ing the acute phase of infection.  Positive  results are indicative of active infection with SARS-CoV-2.  Clinical  correlation with patient history and other diagnostic information is  necessary to determine patient infection status.  Positive results do  not rule out bacterial infection or co-infection with other viruses. If result is PRESUMPTIVE POSTIVE SARS-CoV-2 nucleic acids MAY BE PRESENT.   A presumptive positive result was obtained on the submitted specimen  and confirmed on repeat testing.  While 2019 novel coronavirus  (SARS-CoV-2) nucleic acids may be present in the submitted sample  additional confirmatory testing may be necessary for epidemiological  and / or  clinical management purposes  to differentiate between  SARS-CoV-2 and other Sarbecovirus currently known to infect humans.  If clinically indicated additional testing with an alternate test  methodology [phone]) is advised. The SARS-CoV-2 RNA  is generally  detectable in upper and lower respiratory sp ecimens during the acute  phase of infection. The expected result is Negative. Fact Sheet for Patients:  StrictlyIdeas.no Fact Sheet for Healthcare Providers: BankingDealers.co.za This test is not yet approved or cleared by the [place] FDA and has been authorized for detection and/or diagnosis of SARS-CoV-2 by FDA under an Emergency Use Authorization (EUA).  This EUA will remain in effect (meaning this test can be used) for the duration of the COVID-19 declaration under Section 564(b)(1) of the Act, 21 U.S.C. section 360bbb-3(b)(1), unless the authorization is terminated or revoked sooner. Performed at [facility], [place] [address], [place], [place] [address]      Labs: Basic Metabolic Panel: Recent Labs  Lab 07/17/19 1808 07/19/19 0927  NA 140 138  K 3.4* 3.7  CL 105 103  CO2 26 27  GLUCOSE 86 103*  BUN 5* 6  CREATININE 0.72 0.73  CALCIUM 8.5* 8.6*   Liver Function Tests: Recent Labs  Lab 07/17/19 1808  AST 30  ALT 32  ALKPHOS 48  BILITOT 1.0  PROT 6.4*  ALBUMIN 3.4*   No results for input(s): LIPASE, AMYLASE in the last 168 hours. No results for input(s): AMMONIA in the last 168 hours. CBC: Recent Labs  Lab 07/17/19 1808 07/19/19 0927  WBC 9.4 6.8  NEUTROABS 3.3  --   HGB 9.8* 10.2*  HCT 26.6* 27.2*  MCV 82.1 81.4  PLT 383 343   Cardiac Enzymes: No results for input(s): CKTOTAL, CKMB, CKMBINDEX, TROPONINI in the last 168 hours. BNP: Invalid input(s): POCBNP CBG: No results for input(s): GLUCAP in the last 168 hours.  Time coordinating discharge: 50  minutes  Signed:   Donia Pounds  APRN, MSN, FNP-C [facility] [address] [place], [place] [address] [phone]  [facility] 07/20/2019, 3:38 PM

## 2019-07-20 NOTE — Telephone Encounter (Signed)
Refilled

## 2019-07-20 NOTE — Discharge Instructions (Signed)
Resume all home medications.  °Follow up with PCP as previously  scheduled.  ° °Discussed the importance of drinking 64 ounces of water daily to  help prevent pain crises, it is important to drink plenty of water throughout the day. This is because dehydration of red blood cells may lead further sickling.  ° °Avoid all stressors that precipitate sickle cell pain crisis.   ° ° °The patient was given clear instructions to go to ER or return to medical center if symptoms do not improve, worsen or new problems develop.  ° °Sickle Cell Anemia, Adult ° °Sickle cell anemia is a condition where your red blood cells are shaped like sickles. Red blood cells carry oxygen through the body. Sickle-shaped cells do not live as long as normal red blood cells. They also clump together and block blood from flowing through the blood vessels. This prevents the body from getting enough oxygen. Sickle cell anemia causes organ damage and pain. It also increases the risk of infection. °Follow these instructions at home: °Medicines °· Take over-the-counter and prescription medicines only as told by your doctor. °· If you were prescribed an antibiotic medicine, take it as told by your doctor. Do not stop taking the antibiotic even if you start to feel better. °· If you develop a fever, do not take medicines to lower the fever right away. Tell your doctor about the fever. °Managing pain, stiffness, and swelling °· Try these methods to help with pain: °? Use a heating pad. °? Take a warm bath. °? Distract yourself, such as by watching TV. °Eating and drinking °· Drink enough fluid to keep your pee (urine) clear or pale yellow. Drink more in hot weather and during exercise. °· Limit or avoid alcohol. °· Eat a healthy diet. Eat plenty of fruits, vegetables, whole grains, and lean protein. °· Take vitamins and supplements as told by your doctor. °Traveling °· When traveling, keep these with you: °? Your medical information. °? The names of your  doctors. °? Your medicines. °· If you need to take an airplane, talk to your doctor first. °Activity °· Rest often. °· Avoid exercises that make your heart beat much faster, such as jogging. °General instructions °· Do not use products that have nicotine or tobacco, such as cigarettes and e-cigarettes. If you need help quitting, ask your doctor. °· Consider wearing a medical alert bracelet. °· Avoid being in high places (high altitudes), such as mountains. °· Avoid very hot or cold temperatures. °· Avoid places where the temperature changes a lot. °· Keep all follow-up visits as told by your doctor. This is important. °Contact a doctor if: °· A joint hurts. °· Your feet or hands hurt or swell. °· You feel tired (fatigued). °Get help right away if: °· You have symptoms of infection. These include: °? Fever. °? Chills. °? Being very tired. °? Irritability. °? Poor eating. °? Throwing up (vomiting). °· You feel dizzy or faint. °· You have new stomach pain, especially on the left side. °· You have a an erection (priapism) that lasts more than 4 hours. °· You have numbness in your arms or legs. °· You have a hard time moving your arms or legs. °· You have trouble talking. °· You have pain that does not go away when you take medicine. °· You are short of breath. °· You are breathing fast. °· You have a long-term cough. °· You have pain in your chest. °· You have a bad headache. °· You   You have a stiff neck.  Your stomach looks bloated even though you did not eat much.  Your skin is pale.  You suddenly cannot see well. Summary  Sickle cell anemia is a condition where your red blood cells are shaped like sickles.  Follow your doctor's advice on ways to manage pain, food to eat, activities to do, and steps to take for safe travel.  Get medical help right away if you have any signs of infection, such as a fever. This information is not intended to replace advice given to you by your health care provider. Make  sure you discuss any questions you have with your health care provider. Document Released: 09/26/2013 Document Revised: 03/30/2019 Document Reviewed: 01/11/2017 Elsevier Patient Education  2020 [place] for [place] is a complex disease of the brain that causes an uncontrollable (compulsive) need for:  A substance. This includes alcohol, illegal drugs, or prescription medicines, such as painkillers.  An activity or behavior, such as gambling or shopping. Addiction changes the way your brain works. Because of this change:  The need for the medicine, drug, or activity can become so strong that you think about it all the time.  Getting more and more of your addiction becomes the most important thing to you.  You may find yourself leaving other activities and relationships to pursue your addiction.  You can become physically dependent on a substance.  Your health, behavior, emotions, and relationships can change for the worse. How do I know if I need treatment for addiction? Addiction is a progressive disease. Without treatment, addiction can get worse. Living with addiction puts you at higher risk for injury, poor health, loss of employment, loss of money, and even death. You might need treatment for addiction if:  You have tried to stop or cut down, but you have not succeeded.  You find it annoying that your friends and family are concerned about your use or behavior.  You feel guilty about your use or behavior.  You need a particular substance or activity to start your day or to calm down.  You are running out of money because of your addiction.  You have done something illegal to support your addiction.  Your addiction has caused you: ? Health problems. ? Trouble in school, work, home, or with the police. ? To devote all your time to your addiction, and not to other responsibilities. ? To tell lies in order to hide your problem. What types  of treatment are available? There may be options for treatment programs and plans based on your addiction, condition, needs, and preferences. No single treatment is right for everyone.  Treatment programs can be: ? Outpatient. You live at home and go to work or school, but you go to a clinic for treatment. ? Inpatient. You live and sleep at the program facility during treatment.  Programs may include: ? Medicine. You may need medicine to treat the addiction itself, or to treat anxiety or depression. ? Counseling and behavior therapy. This can help individuals and families behave in healthier ways and relate more effectively. ? Support groups. Confidential group therapy, such as a 12-step program, can help individuals and families during treatment and recovery. ? A combination of education, counseling, and a 12-step, spirituality-based approach. What should I consider when selecting a treatment program? Think about your individual requirements when selecting a treatment program. Ask about:  The overall approach to treatment. ? Some programs are strictly 12-step programs. Some  have a more flexible approach. ? Programs may differ in length of stay, setting, and size. ? Some programs include your family in your treatment plan. Support may be offered to them throughout the treatment process, as well as instructions for them when you are discharged. ? You may continue to receive support after you have left the program.  The types of medical services that are offered. Find out if the program: ? Offers specific treatment for your particular addiction. ? Meets all of your needs, including physical and cultural needs. ? Includes any medicines you might need. ? Offers mental health counseling as part of your treatment. ? Offers the 12-step meetings at the center, or if transport is available for patients to attend meetings at other locations.  The cost and types of insurance that are  accepted. ? Some programs are sponsored by the government. They support patients who do not have private insurance. ? If you do not have insurance, or if you choose to attend a program that does not accept your insurance, call the treatment center. Tell them your financial needs and whether a payment plan can be set up. ? There are also organizations that will help you find the resources for treatment. You can find them online by searching "treatment for addiction."  If the program is certified by the appropriate government agency. Where to find support  Your health care provider can help you to find the right treatment. These discussions are confidential.  The [employer] on Alcoholism and Drug Dependence (NCADD). This group has information about treatment centers and programs for people who have an addiction and for family members. ? Call: 1-800-NCA-CALL (365-883-37651-772-717-6914). ? Visit the website: https://www.ncadd.org/  The Substance Abuse and Mental Health Services Administration [facility](SAMHSA). This organization will help you find publicly funded treatment centers, help hotlines, and counseling services near you. ? Call: 1-800-662-HELP (248-678-23361-970-834-3149). ? Visit the website: www.findtreatment.[employer].plsamhsa.gov  The National Problem Gambling Helpline. This is a 24-hour confidential helpline for gambling addiction. ? Call: 986-217-23091-262-082-2719 ? Visit the website: [employer].tnhttps://www.ncpgambling.org/ In countries outside of the [place] and [place], look in [employer] directories for contact information for services in your area. Follow these instructions at home:  Find supportive people who will help you stay away from your addiction and stay sober.  Do not use the substance or engage in the activity.  If you have been through treatment: ? Follow your plan. The plan is usually developed by you and your health care provider during treatment. ? Go to meetings with other people in recovery. ? Avoid people, situations, and  things that lead you to do the things you are addicted to (triggers). Summary  Addiction changes the way your brain works. These changes cause a desire to repeat and increase the use of the a substance or behavior.  Addiction is a progressive disease. Without treatment, addiction can get worse. Living with addiction puts you at higher risk for injury, poor health, loss of employment, loss of money, and even death.  There may be options for treatment programs and plans based on your addiction, condition, needs, and preferences. No single treatment is right for everyone.  Your health care provider can help you to find the right treatment. These discussions are confidential. This information is not intended to replace advice given to you by your health care provider. Make sure you discuss any questions you have with your health care provider. Document Released: 11/04/2005 Document Revised: 01/04/2018 Document Reviewed: 01/04/2018 Elsevier Patient Education  2020 Elsevier  Inc. ° °

## 2019-07-21 ENCOUNTER — Other Ambulatory Visit: Payer: Self-pay | Admitting: Family Medicine

## 2019-07-21 DIAGNOSIS — D5720 Sickle-cell/Hb-C disease without crisis: Secondary | ICD-10-CM

## 2019-07-23 NOTE — Telephone Encounter (Signed)
Refill request for baclofen. Please advise.

## 2019-07-25 ENCOUNTER — Ambulatory Visit: Payer: Medicaid Other | Admitting: Family Medicine

## 2019-07-27 ENCOUNTER — Ambulatory Visit (INDEPENDENT_AMBULATORY_CARE_PROVIDER_SITE_OTHER): Payer: Medicaid Other | Admitting: Family Medicine

## 2019-07-27 ENCOUNTER — Encounter: Payer: Self-pay | Admitting: Family Medicine

## 2019-07-27 ENCOUNTER — Other Ambulatory Visit: Payer: Self-pay

## 2019-07-27 VITALS — BP 128/86 | HR 80 | Temp 98.4°F | Resp 16 | Ht 62.0 in | Wt 139.0 lb

## 2019-07-27 DIAGNOSIS — Z01419 Encounter for gynecological examination (general) (routine) without abnormal findings: Secondary | ICD-10-CM | POA: Diagnosis not present

## 2019-07-27 DIAGNOSIS — F329 Major depressive disorder, single episode, unspecified: Secondary | ICD-10-CM

## 2019-07-27 DIAGNOSIS — R4589 Other symptoms and signs involving emotional state: Secondary | ICD-10-CM

## 2019-07-27 NOTE — Patient Instructions (Signed)
Health Maintenance, Female Adopting a healthy lifestyle and getting preventive care are important in promoting health and wellness. Ask your health care provider about:  The right schedule for you to have regular tests and exams.  Things you can do on your own to prevent diseases and keep yourself healthy. What should I know about diet, weight, and exercise? Eat a healthy diet   Eat a diet that includes plenty of vegetables, fruits, low-fat dairy products, and lean protein.  Do not eat a lot of foods that are high in solid fats, added sugars, or sodium. Maintain a healthy weight Body mass index (BMI) is used to identify weight problems. It estimates body fat based on height and weight. Your health care provider can help determine your BMI and help you achieve or maintain a healthy weight. Get regular exercise Get regular exercise. This is one of the most important things you can do for your health. Most adults should:  Exercise for at least 150 minutes each week. The exercise should increase your heart rate and make you sweat (moderate-intensity exercise).  Do strengthening exercises at least twice a week. This is in addition to the moderate-intensity exercise.  Spend less time sitting. Even light physical activity can be beneficial. Watch cholesterol and blood lipids Have your blood tested for lipids and cholesterol at 35 years of age, then have this test every 5 years. Have your cholesterol levels checked more often if:  Your lipid or cholesterol levels are high.  You are older than 35 years of age.  You are at high risk for heart disease. What should I know about cancer screening? Depending on your health history and family history, you may need to have cancer screening at various ages. This may include screening for:  Breast cancer.  Cervical cancer.  Colorectal cancer.  Skin cancer.  Lung cancer. What should I know about heart disease, diabetes, and high blood  pressure? Blood pressure and heart disease  High blood pressure causes heart disease and increases the risk of stroke. This is more likely to develop in people who have high blood pressure readings, are of African descent, or are overweight.  Have your blood pressure checked: ? Every 3-5 years if you are 18-39 years of age. ? Every year if you are 40 years old or older. Diabetes Have regular diabetes screenings. This checks your fasting blood sugar level. Have the screening done:  Once every three years after age 40 if you are at a normal weight and have a low risk for diabetes.  More often and at a younger age if you are overweight or have a high risk for diabetes. What should I know about preventing infection? Hepatitis B If you have a higher risk for hepatitis B, you should be screened for this virus. Talk with your health care provider to find out if you are at risk for hepatitis B infection. Hepatitis C Testing is recommended for:  Everyone born from 1945 through 1965.  Anyone with known risk factors for hepatitis C. Sexually transmitted infections (STIs)  Get screened for STIs, including gonorrhea and chlamydia, if: ? You are sexually active and are younger than 35 years of age. ? You are older than 35 years of age and your health care provider tells you that you are at risk for this type of infection. ? Your sexual activity has changed since you were last screened, and you are at increased risk for chlamydia or gonorrhea. Ask your health care provider if   you are at risk.  Ask your health care provider about whether you are at high risk for HIV. Your health care provider may recommend a prescription medicine to help prevent HIV infection. If you choose to take medicine to prevent HIV, you should first get tested for HIV. You should then be tested every 3 months for as long as you are taking the medicine. Pregnancy  If you are about to stop having your period (premenopausal) and  you may become pregnant, seek counseling before you get pregnant.  Take 400 to 800 micrograms (mcg) of folic acid every day if you become pregnant.  Ask for birth control (contraception) if you want to prevent pregnancy. Osteoporosis and menopause Osteoporosis is a disease in which the bones lose minerals and strength with aging. This can result in bone fractures. If you are 65 years old or older, or if you are at risk for osteoporosis and fractures, ask your health care provider if you should:  Be screened for bone loss.  Take a calcium or vitamin D supplement to lower your risk of fractures.  Be given hormone replacement therapy (HRT) to treat symptoms of menopause. Follow these instructions at home: Lifestyle  Do not use any products that contain nicotine or tobacco, such as cigarettes, e-cigarettes, and chewing tobacco. If you need help quitting, ask your health care provider.  Do not use street drugs.  Do not share needles.  Ask your health care provider for help if you need support or information about quitting drugs. Alcohol use  Do not drink alcohol if: ? Your health care provider tells you not to drink. ? You are pregnant, may be pregnant, or are planning to become pregnant.  If you drink alcohol: ? Limit how much you use to 0-1 drink a day. ? Limit intake if you are breastfeeding.  Be aware of how much alcohol is in your drink. In the [place] one drink equals one 12 oz bottle of beer (355 mL), one 5 oz glass of wine (148 mL), or one 1 oz glass of hard liquor (44 mL). General instructions  Schedule regular health, dental, and eye exams.  Stay current with your vaccines.  Tell your health care provider if: ? You often feel depressed. ? You have ever been abused or do not feel safe at home. Summary  Adopting a healthy lifestyle and getting preventive care are important in promoting health and wellness.  Follow your health care provider's instructions about healthy  diet, exercising, and getting tested or screened for diseases.  Follow your health care provider's instructions on monitoring your cholesterol and blood pressure. This information is not intended to replace advice given to you by your health care provider. Make sure you discuss any questions you have with your health care provider. Document Released: 06/21/2011 Document Revised: 11/29/2018 Document Reviewed: 11/29/2018 Elsevier Patient Education  2020 Elsevier Inc.  

## 2019-07-27 NOTE — Progress Notes (Signed)
   [facility]  Annual GYN Examination Provider: Lanae Boast, FNP   SUBJECTIVE: Hannah Hawkins is a 33 y.o. female who presents for her annual gynecological examination.  Current concerns: Patient with a history of pre-cancerous cells.     GYNECOLOGICAL HISTORY: Patient's last menstrual period was 07/15/2019. Contraception: none Last Pap:7 years ago per patient Results were: abnormal Last mammogram: n/a. Results were: n/a  OBSTETRIC HISTORY: OB History  No obstetric history on file.     The following portions of the patient's history were reviewed and updated as appropriate: allergies, current medications, past family history, past medical history, past social history, past surgical history and problem list.  REVIEW OF SYSTEMS: Review of Systems  Constitutional: Negative.   HENT: Negative.   Eyes: Negative.   Respiratory: Negative.   Cardiovascular: Negative.   Gastrointestinal: Negative.   Genitourinary: Negative.   Musculoskeletal: Negative.   Skin: Negative.   Neurological: Negative.   Psychiatric/Behavioral: Negative.       OBJECTIVE:  Physical Exam Constitutional:      General: She is not in acute distress.    Appearance: She is normal weight.  Genitourinary:     Pelvic exam was performed with patient in the lithotomy position.     Vulva, inguinal canal, urethra, bladder, vagina, cervix, uterus, right adnexa, left adnexa and rectum normal.  HENT:     Head: Normocephalic and atraumatic.  Eyes:     Extraocular Movements: Extraocular movements intact.     Conjunctiva/sclera: Conjunctivae normal.     Pupils: Pupils are equal, round, and reactive to light.  Cardiovascular:     Rate and Rhythm: Normal rate.     Pulses: Normal pulses.  Pulmonary:     Effort: Pulmonary effort is normal.  Chest:     Breasts: Tanner Score is 5. Breasts are symmetrical.        Right: Normal.        Left: Normal.   Abdominal:     General: Abdomen is flat. Bowel sounds are normal.     Palpations: Abdomen is soft.     Tenderness: There is no abdominal tenderness.  Lymphadenopathy:     Upper Body:     Right upper body: No supraclavicular, axillary or pectoral adenopathy.     Left upper body: No supraclavicular, axillary or pectoral adenopathy.  Neurological:     Mental Status: She is alert and oriented to person, place, and time.  Psychiatric:        Mood and Affect: Mood normal.        Behavior: Behavior normal.        Thought Content: Thought content normal.        Judgment: Judgment normal.  Vitals signs and nursing note reviewed. Exam conducted with a chaperone present.      ASSESSMENT/PLAN:  1. Well woman exam with routine gynecological exam - Pap IG, CT/NG NAA, and HPV (high risk) [facility] - Ambulatory referral to [facility]  2. Depressed mood Patient requesting referral to [facility] for counseling.  - Ambulatory referral to [place]     Education reviewed: depression evaluation, safe sex/STD prevention and self breast exams. Contraception: none. Follow up in: 4 weeks.    Return to care as scheduled and prn. Patient verbalized understanding and agreed with plan of care.   Ms. Doug Sou. Nathaneil Canary, FNP-BC [facility] [address] [place], [place] [address] [phone]

## 2019-07-30 ENCOUNTER — Other Ambulatory Visit: Payer: Self-pay | Admitting: Family Medicine

## 2019-07-30 ENCOUNTER — Telehealth: Payer: Self-pay

## 2019-07-30 ENCOUNTER — Other Ambulatory Visit: Payer: Self-pay

## 2019-07-30 DIAGNOSIS — D5720 Sickle-cell/Hb-C disease without crisis: Secondary | ICD-10-CM

## 2019-07-30 DIAGNOSIS — D57219 Sickle-cell/Hb-C disease with crisis, unspecified: Secondary | ICD-10-CM

## 2019-07-30 NOTE — Telephone Encounter (Signed)
Patient called today. She states she is experiencing abdominal cramping after eating every time. She also states she has had diarrhea every day for the last 2 weeks. She states she has not tried anything over the counter. Please advise if she should come in or if there is anything otc she should try first. Thanks!

## 2019-07-30 NOTE — Telephone Encounter (Signed)
She can try otc pepto bismol. Please let her know that this can turn her stools dark. If not resolving, will need appt.

## 2019-07-30 NOTE — Telephone Encounter (Signed)
Called and spoke with patient, advised that she can try pepto bismol. Patient states she has already tried and this did not help. I advised her to make an appointment to come in and she was transferred to the front desk for an appointment.

## 2019-07-31 NOTE — Telephone Encounter (Signed)
Andre, Please advise. Thanks!  

## 2019-08-01 ENCOUNTER — Other Ambulatory Visit: Payer: Self-pay | Admitting: Family Medicine

## 2019-08-01 ENCOUNTER — Ambulatory Visit: Payer: Medicaid Other | Admitting: Family Medicine

## 2019-08-01 DIAGNOSIS — D5720 Sickle-cell/Hb-C disease without crisis: Secondary | ICD-10-CM

## 2019-08-01 MED ORDER — OXYCODONE HCL 15 MG PO TABS: 15.0000 mg | ORAL_TABLET | Freq: Four times a day (QID) | ORAL | 0 refills | Status: DC | PRN

## 2019-08-01 NOTE — Progress Notes (Signed)
Refilled for 08/04/2019. No early refills will be granted.

## 2019-08-03 ENCOUNTER — Telehealth: Payer: Self-pay

## 2019-08-03 ENCOUNTER — Other Ambulatory Visit: Payer: Self-pay | Admitting: Family Medicine

## 2019-08-03 DIAGNOSIS — D5720 Sickle-cell/Hb-C disease without crisis: Secondary | ICD-10-CM

## 2019-08-03 DIAGNOSIS — Z79891 Long term (current) use of opiate analgesic: Secondary | ICD-10-CM

## 2019-08-03 LAB — PAP IG, CT-NG NAA, HPV HIGH-RISK
Chlamydia, Nuc. Acid Amp: NEGATIVE
Gonococcus by Nucleic Acid Amp: NEGATIVE
HPV, high-risk: NEGATIVE

## 2019-08-03 MED ORDER — MORPHINE SULFATE ER 30 MG PO TBCR: 30.0000 mg | EXTENDED_RELEASE_TABLET | Freq: Two times a day (BID) | ORAL | 0 refills | Status: DC

## 2019-08-03 NOTE — Telephone Encounter (Signed)
Called - no answer. Left a message.

## 2019-08-03 NOTE — Progress Notes (Signed)
This request is one week early. I sent to the pharm on file to be filled on 8/21. This patient is to not receive early refills.

## 2019-08-03 NOTE — Telephone Encounter (Signed)
Patient returned call and I advised of abnormal pap smear. Advised that gc/chl was negative and we will need to repeat pap in 6 months. Patient verbalized understanding. Thanks !

## 2019-08-03 NOTE — Telephone Encounter (Signed)
Refill request for MS Contin 30mg . Please advise. Thanks!

## 2019-08-03 NOTE — Telephone Encounter (Signed)
-----   Message from Lanae Boast, [place] sent at 08/03/2019  9:34 AM EDT ----- Please let patient know that her pap smear was abnormal. She will need to have her pap smear repeated in 6 months. She was negative for chlamydia and gonorrhea.

## 2019-08-03 NOTE — Progress Notes (Signed)
Please let patient know that her pap smear was abnormal. She will need to have her pap smear repeated in 6 months. She was negative for chlamydia and gonorrhea.

## 2019-08-03 NOTE — Telephone Encounter (Signed)
Called, no answer. Left a message to call back. Thanks!  

## 2019-08-07 ENCOUNTER — Encounter: Payer: Medicaid Other | Admitting: Neurology

## 2019-08-07 ENCOUNTER — Ambulatory Visit: Payer: Medicaid Other | Admitting: Neurology

## 2019-08-10 IMAGING — DX DG CHEST 1V PORT
1 series · 1 of 1 positions shown · non-contrast
Comparison: 11/02/2017

CLINICAL DATA: Sickle-cell crisis

EXAM:
PORTABLE CHEST 1 VIEW

[chest ap]
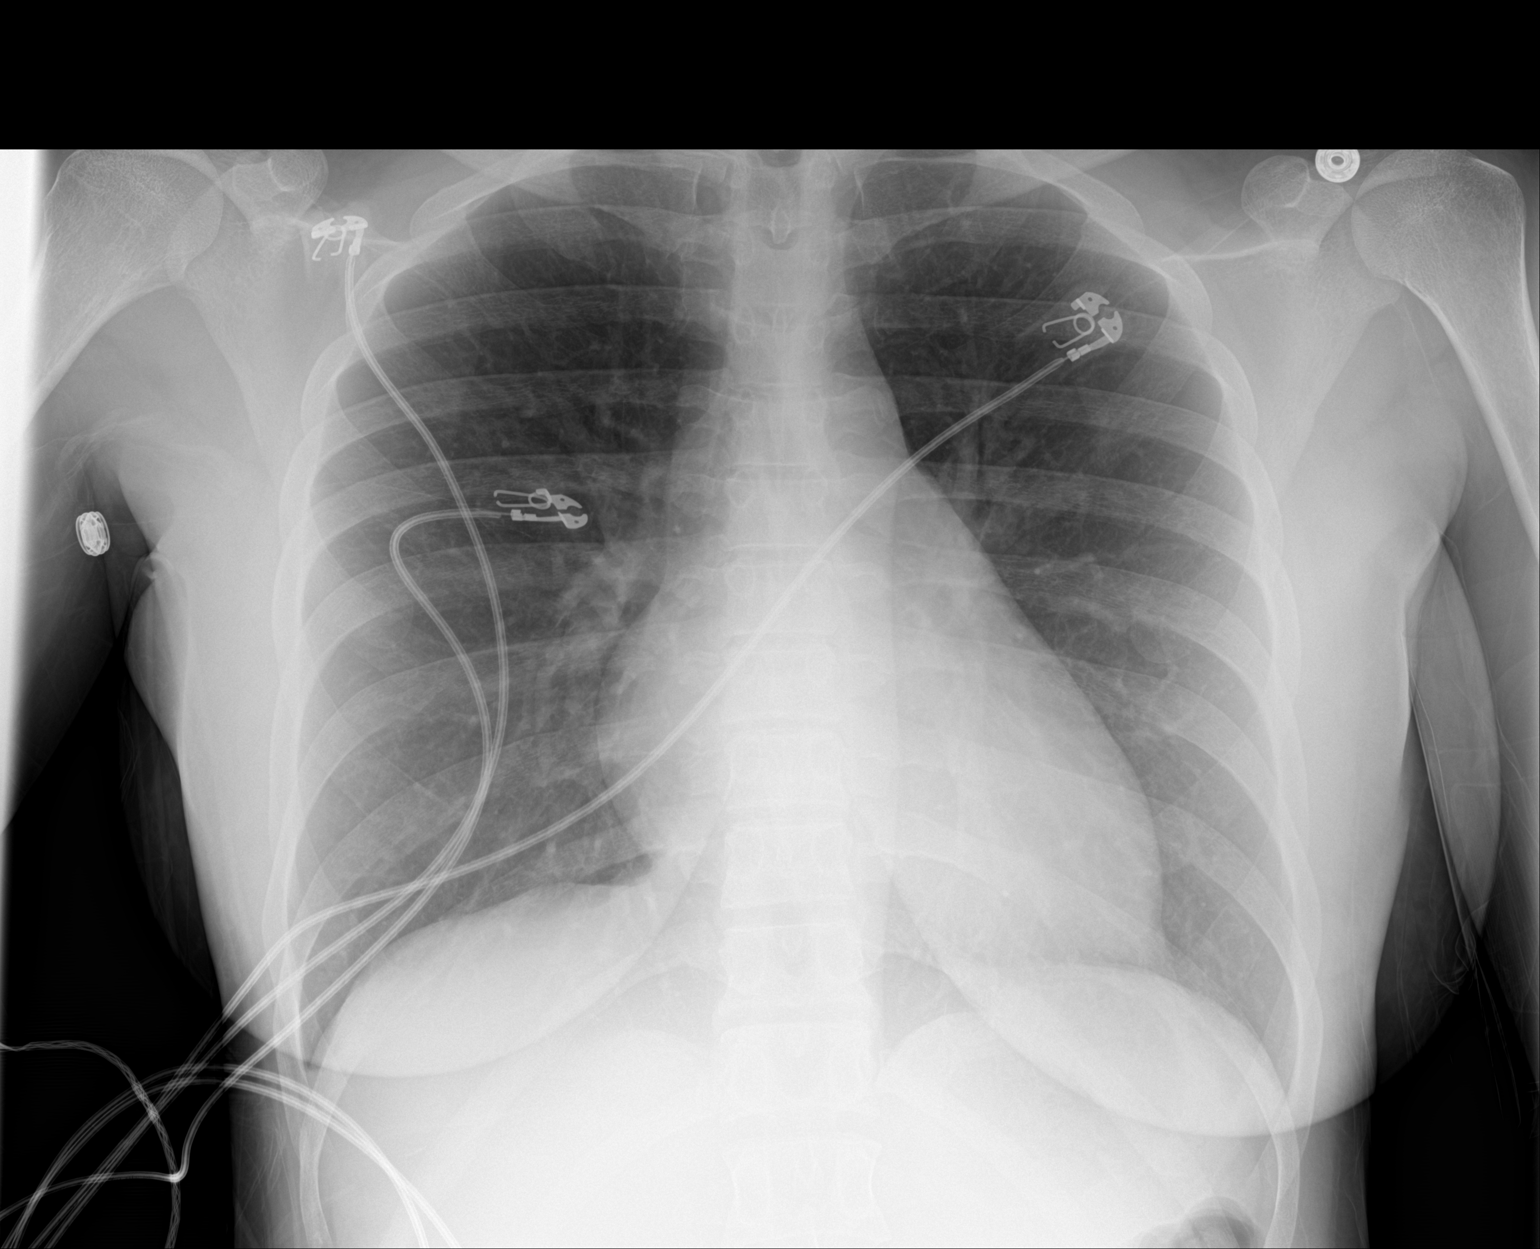

[1 of 1 positions shown; findings below may reference images not displayed]

FINDINGS: Stable mild cardiomegaly without acute pulmonary consolidation or
edema. No effusion or pneumothorax. No acute osseous abnormality.
IMPRESSION: Stable cardiomegaly with clear lungs.  No active pulmonary disease.

## 2019-08-16 ENCOUNTER — Other Ambulatory Visit: Payer: Self-pay | Admitting: Family Medicine

## 2019-08-16 ENCOUNTER — Telehealth: Payer: Self-pay

## 2019-08-16 ENCOUNTER — Other Ambulatory Visit: Payer: Self-pay

## 2019-08-16 DIAGNOSIS — D5720 Sickle-cell/Hb-C disease without crisis: Secondary | ICD-10-CM

## 2019-08-16 MED ORDER — BACLOFEN 5 MG PO TABS: 1.0000 | ORAL_TABLET | Freq: Every day | ORAL | 0 refills | Status: DC | PRN

## 2019-08-16 MED ORDER — OXYCODONE HCL 15 MG PO TABS: 15.0000 mg | ORAL_TABLET | Freq: Four times a day (QID) | ORAL | 0 refills | Status: DC | PRN

## 2019-08-16 NOTE — Telephone Encounter (Signed)
Patient is asking if baclofen can be changed back to 10mg ? Please advise. Thanks!

## 2019-08-16 NOTE — Telephone Encounter (Signed)
Refill request

## 2019-08-16 NOTE — Progress Notes (Signed)
Reviewed [place] Substance Reporting system prior to prescribing opiate medications. No inconsistencies noted.   

## 2019-08-16 NOTE — Telephone Encounter (Signed)
Called no answer. Left a message to call back.

## 2019-08-24 ENCOUNTER — Ambulatory Visit: Payer: Medicaid Other | Admitting: Family Medicine

## 2019-08-29 ENCOUNTER — Encounter (HOSPITAL_COMMUNITY): Payer: Self-pay

## 2019-08-29 ENCOUNTER — Encounter (HOSPITAL_COMMUNITY): Payer: Self-pay | Admitting: *Deleted

## 2019-08-29 ENCOUNTER — Other Ambulatory Visit: Payer: Self-pay

## 2019-08-29 DIAGNOSIS — D5720 Sickle-cell/Hb-C disease without crisis: Secondary | ICD-10-CM

## 2019-08-29 DIAGNOSIS — Z79891 Long term (current) use of opiate analgesic: Secondary | ICD-10-CM

## 2019-08-29 MED ORDER — GABAPENTIN 100 MG PO CAPS: 100.0000 mg | ORAL_CAPSULE | Freq: Three times a day (TID) | ORAL | 0 refills | Status: DC

## 2019-08-29 MED ORDER — PROMETHAZINE HCL 12.5 MG PO TABS: 12.5000 mg | ORAL_TABLET | ORAL | 0 refills | Status: DC | PRN

## 2019-08-29 NOTE — Telephone Encounter (Signed)
Refill request. Please advise.  

## 2019-08-30 ENCOUNTER — Other Ambulatory Visit: Payer: Self-pay | Admitting: Family Medicine

## 2019-08-30 DIAGNOSIS — D5720 Sickle-cell/Hb-C disease without crisis: Secondary | ICD-10-CM

## 2019-08-30 MED ORDER — OXYCODONE HCL 15 MG PO TABS: 15.0000 mg | ORAL_TABLET | Freq: Four times a day (QID) | ORAL | 0 refills | Status: DC | PRN

## 2019-08-30 NOTE — Progress Notes (Signed)
MS Contin is not due until 09/09/2019

## 2019-08-30 NOTE — Telephone Encounter (Signed)
Please let patient know that her long acting MS contin is not due until 09/09/2019

## 2019-09-04 ENCOUNTER — Other Ambulatory Visit: Payer: Self-pay

## 2019-09-04 DIAGNOSIS — Z79891 Long term (current) use of opiate analgesic: Secondary | ICD-10-CM

## 2019-09-04 DIAGNOSIS — D5720 Sickle-cell/Hb-C disease without crisis: Secondary | ICD-10-CM

## 2019-09-04 NOTE — Telephone Encounter (Signed)
Refill request for oxycontin. Please advise.  

## 2019-09-05 ENCOUNTER — Ambulatory Visit: Payer: Medicaid Other | Admitting: Family Medicine

## 2019-09-06 ENCOUNTER — Other Ambulatory Visit: Payer: Self-pay

## 2019-09-06 DIAGNOSIS — D5720 Sickle-cell/Hb-C disease without crisis: Secondary | ICD-10-CM

## 2019-09-06 DIAGNOSIS — Z79891 Long term (current) use of opiate analgesic: Secondary | ICD-10-CM

## 2019-09-07 MED ORDER — MORPHINE SULFATE ER 30 MG PO TBCR: 30.0000 mg | EXTENDED_RELEASE_TABLET | Freq: Two times a day (BID) | ORAL | 0 refills | Status: DC

## 2019-09-07 NOTE — Telephone Encounter (Signed)
Refill request for MS Contin. Please advise.

## 2019-09-09 ENCOUNTER — Encounter (HOSPITAL_COMMUNITY): Payer: Self-pay | Admitting: Emergency Medicine

## 2019-09-09 ENCOUNTER — Emergency Department (HOSPITAL_COMMUNITY)
Admission: EM | Admit: 2019-09-09 | Discharge: 2019-09-10 | Disposition: A | Discharge status: Home / Self Care | Payer: Medicaid Other | Attending: Emergency Medicine | Admitting: Emergency Medicine

## 2019-09-09 ENCOUNTER — Other Ambulatory Visit: Payer: Self-pay

## 2019-09-09 ENCOUNTER — Emergency Department (HOSPITAL_COMMUNITY): Payer: Medicaid Other

## 2019-09-09 DIAGNOSIS — M79605 Pain in left leg: Secondary | ICD-10-CM | POA: Diagnosis not present

## 2019-09-09 DIAGNOSIS — D5700 Hb-SS disease with crisis, unspecified: Secondary | ICD-10-CM | POA: Diagnosis present

## 2019-09-09 DIAGNOSIS — M50122 Cervical disc disorder at C5-C6 level with radiculopathy: Secondary | ICD-10-CM

## 2019-09-09 DIAGNOSIS — G8929 Other chronic pain: Secondary | ICD-10-CM | POA: Diagnosis not present

## 2019-09-09 DIAGNOSIS — R531 Weakness: Secondary | ICD-10-CM | POA: Diagnosis not present

## 2019-09-09 DIAGNOSIS — Z79899 Other long term (current) drug therapy: Secondary | ICD-10-CM | POA: Insufficient documentation

## 2019-09-09 DIAGNOSIS — R202 Paresthesia of skin: Secondary | ICD-10-CM | POA: Diagnosis not present

## 2019-09-09 DIAGNOSIS — R29898 Other symptoms and signs involving the musculoskeletal system: Secondary | ICD-10-CM

## 2019-09-09 DIAGNOSIS — Z87891 Personal history of nicotine dependence: Secondary | ICD-10-CM | POA: Insufficient documentation

## 2019-09-09 LAB — RETICULOCYTES
Immature Retic Fract: 37.2 % — ABNORMAL HIGH (ref 2.3–15.9)
RBC.: 3.35 MIL/uL — ABNORMAL LOW (ref 3.87–5.11)
Retic Count, Absolute: 94.5 10*3/uL (ref 19.0–186.0)
Retic Ct Pct: 2.8 % (ref 0.4–3.1)

## 2019-09-09 MED ORDER — KETOROLAC TROMETHAMINE 15 MG/ML IJ SOLN
15.0000 mg | INTRAMUSCULAR | Status: AC
  Administered 2019-09-09: 15 mg via INTRAVENOUS
  Filled 2019-09-09: qty 1

## 2019-09-09 MED ORDER — SODIUM CHLORIDE 0.45 % IV SOLN
INTRAVENOUS | Status: DC
  Administered 2019-09-09: 23:00:00 via INTRAVENOUS

## 2019-09-09 NOTE — ED Triage Notes (Signed)
Patient c/o left leg and lower back pain today. Hx sickle cell. Also c/o tingling to right hand since 1400.

## 2019-09-09 NOTE — ED Provider Notes (Signed)
[facility] DEPT Provider Note   CSN: [id number] Arrival date & time: 09/09/19  2054     History   Chief Complaint Chief Complaint  Patient presents with  . Sickle Cell Pain Crisis    HPI Hannah Hawkins is a 33 y.o. female.     HPI   Patient is here because she woke up from a nap today at 2 PM, and noticed that she had weakness and tingling in her right arm.  This has persisted since then.  She denies sleeping on her right arm.  She went to sleep about 6 AM this morning, and awoke at 2 PM.  At 5 AM this morning she took narcotic pain relievers, as usual.  Prior to that she had been awake for about 11 hours, doing some cooking and cleaning around her house.  She states that yesterday she got some semen in her eye while having intercourse.  Use some eyedrops and that seemed to help the discomfort from that event.  She denies fever, chills, nausea, vomiting, cough, shortness of breath or chest pain.  She feels like she might be in sickle cell crisis because she has some pain in her left posterior thigh, "my hamstrings."  There are no other known modifying factors.  Past Medical History:  Diagnosis Date  . Sickle cell anemia [facility])     Patient Active Problem List   Diagnosis Date Noted  . Hypokalemia 07/17/2019  . GERD (gastroesophageal reflux disease) 05/29/2019  . History of cocaine use 05/18/2019  . Chronic pain syndrome   . Sickle cell crisis ([place]) 05/15/2019  . Cocaine use   . Sickle cell pain crisis ([place]) 11/14/2017  . Anemia of chronic disease 11/13/2017  . Sinusitis, chronic 11/13/2017  . Sickle cell anemia with crisis ([place]) 11/02/2017  . 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 EYE SURGERY  03/09/2017   left eye     OB History   No obstetric history on file.      Home Medications    Prior to Admission medications   Medication Sig Start Date End Date Taking?  Authorizing Provider  Baclofen 5 MG TABS Take 1 tablet by mouth daily as needed. Patient taking differently: Take 1 tablet by mouth daily as needed (spasms).  08/16/19  Yes Lanae Boast, FNP  ergocalciferol (VITAMIN D2) 1.25 MG (50000 UT) capsule Take 1 capsule (50,000 Units total) by mouth once a week. 05/10/19  Yes Lanae Boast, FNP  gabapentin (NEURONTIN) 100 MG capsule Take 1 capsule (100 mg total) by mouth 3 (three) times daily. 08/29/19  Yes Lanae Boast, FNP  hydroxyurea (HYDREA) 500 MG capsule TAKE 2 CAPSULES(1000 MG) BY MOUTH DAILY Patient taking differently: Take 1,000 mg by mouth daily.  05/07/19  Yes Lanae Boast, FNP  ibuprofen (ADVIL) 800 MG tablet TAKE 1 TABLET(800 MG) BY MOUTH EVERY 8 HOURS AS NEEDED FOR MODERATE PAIN Patient taking differently: Take 800 mg by mouth every 8 (eight) hours as needed for moderate pain.  07/31/19  Yes Lanae Boast, FNP  levocetirizine (XYZAL) 5 MG tablet TAKE 1 TABLET(5 MG) BY MOUTH EVERY EVENING Patient taking differently: Take 5 mg by mouth daily as needed for allergies.  04/11/19  Yes Lanae Boast, FNP  morphine (MS CONTIN) 30 MG 12 hr tablet Take 1 tablet (30 mg total) by mouth every 12 (twelve) hours. 09/07/19 10/07/19 Yes Lanae Boast, FNP  NARCAN 4 MG/0.1ML LIQD nasal spray kit Place  4 mg into the nose once as needed (overdose).  12/22/17  Yes [provider]  ondansetron (ZOFRAN) 4 MG tablet Take 4 mg by mouth every 4 (four) hours as needed for nausea/vomiting. 06/25/19  Yes [provider]  oxyCODONE (ROXICODONE) 15 MG immediate release tablet Take 1 tablet (15 mg total) by mouth every 6 (six) hours as needed for up to 15 days for pain. 08/30/19 09/14/19 Yes Lanae Boast, FNP  pantoprazole (PROTONIX) 40 MG tablet Take 1 tablet (40 mg total) by mouth daily. 05/10/19  Yes Lanae Boast, FNP  promethazine (PHENERGAN) 12.5 MG tablet Take 1 tablet (12.5 mg total) by mouth every 4 (four) hours as needed for nausea or vomiting.  08/29/19  Yes Lanae Boast, FNP  Lactobacillus Rhamnosus, GG, (CULTURELLE) CAPS Take 1 capsule by mouth daily. Take 1 cap po BID x 7 days and then decrease to once a day. Patient not taking: Reported on 05/28/2019 04/11/19   Lanae Boast, FNP    Family History Family History  Problem Relation Age of Onset  . Diabetes Father     Social History Social History   Tobacco Use  . Smoking status: Former Smoker    Types: Cigarettes  . Smokeless tobacco: Never Used  Substance Use Topics  . Alcohol use: Not Currently  . Drug use: No     Allergies   Penicillins   Review of Systems Review of Systems  All other systems reviewed and are negative.    Physical Exam Updated Vital Signs BP 123/72 (BP Location: Left Arm)   Pulse 68   Temp (!) 97.4 F (36.3 C) (Oral)   Resp 11   SpO2 100%   Physical Exam Vitals signs and nursing note reviewed.  Constitutional:      General: She is not in acute distress.    Appearance: She is well-developed. She is not ill-appearing, toxic-appearing or diaphoretic.  HENT:     Head: Normocephalic and atraumatic.     Right Ear: External ear normal.     Left Ear: External ear normal.     Mouth/Throat:     Pharynx: No oropharyngeal exudate or posterior oropharyngeal erythema.  Eyes:     Conjunctiva/sclera: Conjunctivae normal.     Pupils: Pupils are equal, round, and reactive to light.  Neck:     Musculoskeletal: Normal range of motion and neck supple.     Trachea: Phonation normal.  Cardiovascular:     Rate and Rhythm: Normal rate and regular rhythm.     Heart sounds: Normal heart sounds.  Pulmonary:     Effort: Pulmonary effort is normal.     Breath sounds: Normal breath sounds.  Abdominal:     Palpations: Abdomen is soft.     Tenderness: There is no abdominal tenderness.  Musculoskeletal: Normal range of motion.  Skin:    General: Skin is warm and dry.     Coloration: Skin is not jaundiced or pale.  Neurological:     Mental Status:  She is alert and oriented to person, place, and time.     Cranial Nerves: No cranial nerve deficit.     Sensory: No sensory deficit.     Motor: Weakness (Strength arms and legs bilaterally 5/5.) present. No abnormal muscle tone.     Coordination: Coordination normal.     Comments: No dysarthria or aphasia.  No ataxia.  No pronator drift.  Mild light touch dysesthesia of the right arm, right hand and right foot.  Psychiatric:  Mood and Affect: Mood normal.        Behavior: Behavior normal.        Thought Content: Thought content normal.        Judgment: Judgment normal.      ED Treatments / Results  Labs (all labs ordered are listed, but only abnormal results are displayed) Labs Reviewed  CBC WITH DIFFERENTIAL/PLATELET  RETICULOCYTES  COMPREHENSIVE METABOLIC PANEL  URINALYSIS, ROUTINE W REFLEX MICROSCOPIC    EKG None  Radiology No results found.  Procedures Procedures (including critical care time)  Medications Ordered in ED Medications  0.45 % sodium chloride infusion (has no administration in time range)  ketorolac (TORADOL) 15 MG/ML injection 15 mg (has no administration in time range)     Initial Impression / Assessment and Plan / ED Course  I have reviewed the triage vital signs and the nursing notes.  Pertinent labs & imaging results that were available during my care of the patient were reviewed by me and considered in my medical decision making (see chart for details).  Clinical Course as of Sep 08 2244  Sun Sep 09, 2019  2241 Patient with stated weakness right arm and numbness right arm and right leg.  No facial numbness.  Onset of symptoms on awakening at 2 PM today, from a nap for 8 hours.  'Last seen normal', 6 AM this morning.  This does not meet criteria for code stroke activation.   [EW]    Clinical Course User Index [EW] Daleen Bo, MD        Patient Vitals for the past 24 hrs:  BP Temp Temp src Pulse Resp SpO2  09/09/19 2241 123/72  (!) 97.4 F (36.3 C) Oral 68 11 100 %  09/09/19 2109 (!) 109/94 (!) 97.3 F (36.3 C) Oral 80 20 99 %   10:40 PM-initiated evaluation.  Low concern for stroke or significant sickle cell crisis.  Screening tests ordered and will reassess.  Medical Decision Making: Patient with sickle cell anemia, presenting with pain in left leg and concern for numbness and weakness in right arm.  No facial involvement.  No altered mentation or speech problems.  Patient evaluated, in the emergency department.  CRITICAL CARE-no Performed by: Daleen Bo   Nursing Notes Reviewed/ Care Coordinated Applicable Imaging Reviewed Interpretation of Laboratory Data incorporated into ED treatment   Plan-reevaluation by oncoming provider team, after completion of testing.  Final Clinical Impressions(s) / ED Diagnoses   Final diagnoses:  Paresthesia of right arm  Weakness of right arm  Pain in left leg  Sickle-cell disease with pain [facility])    ED Discharge Orders    None       Daleen Bo, MD 09/09/19 2245

## 2019-09-09 NOTE — ED Notes (Signed)
Patient transported to X-ray 

## 2019-09-10 ENCOUNTER — Ambulatory Visit: Payer: Medicaid Other | Admitting: Family Medicine

## 2019-09-10 ENCOUNTER — Emergency Department (HOSPITAL_COMMUNITY): Payer: Medicaid Other

## 2019-09-10 DIAGNOSIS — Z79899 Other long term (current) drug therapy: Secondary | ICD-10-CM | POA: Diagnosis not present

## 2019-09-10 DIAGNOSIS — R202 Paresthesia of skin: Secondary | ICD-10-CM | POA: Diagnosis not present

## 2019-09-10 DIAGNOSIS — Z87891 Personal history of nicotine dependence: Secondary | ICD-10-CM | POA: Diagnosis not present

## 2019-09-10 DIAGNOSIS — D5700 Hb-SS disease with crisis, unspecified: Secondary | ICD-10-CM | POA: Diagnosis present

## 2019-09-10 DIAGNOSIS — R531 Weakness: Secondary | ICD-10-CM | POA: Diagnosis not present

## 2019-09-10 DIAGNOSIS — G8929 Other chronic pain: Secondary | ICD-10-CM | POA: Diagnosis not present

## 2019-09-10 DIAGNOSIS — M79605 Pain in left leg: Secondary | ICD-10-CM | POA: Diagnosis not present

## 2019-09-10 DIAGNOSIS — M50122 Cervical disc disorder at C5-C6 level with radiculopathy: Secondary | ICD-10-CM | POA: Diagnosis not present

## 2019-09-10 LAB — CBC WITH DIFFERENTIAL/PLATELET
Abs Immature Granulocytes: 0.03 10*3/uL (ref 0.00–0.07)
Basophils Absolute: 0.1 10*3/uL (ref 0.0–0.1)
Basophils Relative: 1 %
Eosinophils Absolute: 1.2 10*3/uL — ABNORMAL HIGH (ref 0.0–0.5)
Eosinophils Relative: 15 %
HCT: 27.3 % — ABNORMAL LOW (ref 36.0–46.0)
Hemoglobin: 10.1 g/dL — ABNORMAL LOW (ref 12.0–15.0)
Immature Granulocytes: 0 %
Lymphocytes Relative: 47 %
Lymphs Abs: 3.8 10*3/uL (ref 0.7–4.0)
MCH: 30.1 pg (ref 26.0–34.0)
MCHC: 37 g/dL — ABNORMAL HIGH (ref 30.0–36.0)
MCV: 81.5 fL (ref 80.0–100.0)
Monocytes Absolute: 0.9 10*3/uL (ref 0.1–1.0)
Monocytes Relative: 11 %
Neutro Abs: 2.2 10*3/uL (ref 1.7–7.7)
Neutrophils Relative %: 26 %
Platelets: 248 10*3/uL (ref 150–400)
RBC: 3.35 MIL/uL — ABNORMAL LOW (ref 3.87–5.11)
RDW: 14.2 % (ref 11.5–15.5)
WBC: 8.3 10*3/uL (ref 4.0–10.5)
nRBC: 5.8 % — ABNORMAL HIGH (ref 0.0–0.2)

## 2019-09-10 LAB — COMPREHENSIVE METABOLIC PANEL
ALT: 12 U/L (ref 0–44)
AST: 22 U/L (ref 15–41)
Albumin: 3.5 g/dL (ref 3.5–5.0)
Alkaline Phosphatase: 44 U/L (ref 38–126)
Anion gap: 8 (ref 5–15)
BUN: 6 mg/dL (ref 6–20)
CO2: 29 mmol/L (ref 22–32)
Calcium: 8.8 mg/dL — ABNORMAL LOW (ref 8.9–10.3)
Chloride: 103 mmol/L (ref 98–111)
Creatinine, Ser: 0.69 mg/dL (ref 0.44–1.00)
GFR calc Af Amer: >60 mL/min (ref >60)
GFR calc non Af Amer: >60 mL/min (ref >60)
Glucose, Bld: 81 mg/dL (ref 70–99)
Potassium: 3.4 mmol/L — ABNORMAL LOW (ref 3.5–5.1)
Sodium: 140 mmol/L (ref 135–145)
Total Bilirubin: 0.3 mg/dL (ref 0.3–1.2)
Total Protein: 6.3 g/dL — ABNORMAL LOW (ref 6.5–8.1)

## 2019-09-10 MED ORDER — HYDROMORPHONE HCL 1 MG/ML IJ SOLN
0.5000 mg | Freq: Once | INTRAMUSCULAR | Status: AC
  Administered 2019-09-10: 0.5 mg via INTRAVENOUS
  Filled 2019-09-10: qty 1

## 2019-09-10 MED ORDER — DEXAMETHASONE SODIUM PHOSPHATE 10 MG/ML IJ SOLN
10.0000 mg | Freq: Once | INTRAMUSCULAR | Status: AC
  Administered 2019-09-10: 06:00:00 10 mg via INTRAVENOUS
  Filled 2019-09-10: qty 1

## 2019-09-10 MED ORDER — HYDROMORPHONE HCL 1 MG/ML IJ SOLN
1.0000 mg | Freq: Once | INTRAMUSCULAR | Status: AC
  Administered 2019-09-10: 1 mg via INTRAVENOUS
  Filled 2019-09-10: qty 1

## 2019-09-10 MED ORDER — SODIUM CHLORIDE 0.9 % IV BOLUS (SEPSIS)
1000.0000 mL | Freq: Once | INTRAVENOUS | Status: AC
  Administered 2019-09-10: 02:00:00 1000 mL via INTRAVENOUS

## 2019-09-10 NOTE — ED Notes (Signed)
Pt arrived from [facility] here for a mri.  Sickle cell pt  That is c/o numbness rt arm rt hand  Rt great toe  Report given pt having a sickle cell crisis lt leg pain  All labs at [facility]  Normal   See labd  Last medicated with dilaudid 1 mg  0130am

## 2019-09-10 NOTE — ED Provider Notes (Signed)
11:00 PM  Assumed care from Dr. Eulis Foster.  Patient is a 33 year old female with history of sickle cell who presents to the emergency department with right upper extremity pain, numbness and weakness.  Also having some numbness in the right foot.  Labs, head CT pending.  Given Toradol for pain.  1:20 AM  Pt continues to have significant pain in the right upper extremity.  Reports no relief with Toradol.  Patient states she is still having numbness and tingling throughout the entire arm and it feels heavy and she feels it is weak.  Last seen normal at 6 AM when she went to sleep.  Woke up at 2 PM with the symptoms.  Head CT shows no acute abnormality.  Will discuss with neurology for further recommendations.  I suspect patient will likely need an MRI of her brain without contrast given she is high risk for stroke given her sickle cell.  No previous history of stroke per her report.  1:25 AM  Discussed with Dr. Leonel Ramsay with neurology.  Appreciate his help.  He recommends MRI of the brain and cervical spine without contrast.  Patient updated with plan.  Ordered a dose of Dilaudid for her pain.  Discussed with Dr. Leonette Monarch at the [facility] ED who agrees to accept patient in transfer.    EKG Interpretation  Date/Time:  Monday September 10 2019 01:34:38 EDT Ventricular Rate:  79 PR Interval:    QRS Duration: 78 QT Interval:  394 QTC Calculation: 452 R Axis:   48 Text Interpretation:  Sinus rhythm No significant change since last tracing Confirmed by Rola Lennon, Cyril Mourning [phone]) on 09/10/2019 1:37:14 AM         Kamen Hanken, Delice Bison, DO 09/10/19 4034

## 2019-09-10 NOTE — ED Provider Notes (Signed)
I assumed care of this patient from Dr. Leonides Schanz as a transfer from [facility] for right arm numbness requiring MRI.  Please see their note for further details of Hx, PE.    MRI brain negative. MRI cervical spine notable for foraminal protrusion at C5-C6, correlating with her symptoms.  Patient given additional pain meds and decadron. NSU follow up recommended.  The patient is safe for discharge with strict return precautions.   The patient appears reasonably screened and/or stabilized for discharge and I doubt any other medical condition or other [facility] requiring further screening, evaluation, or treatment in the ED at this time prior to discharge.  Disposition: Discharge  Condition: Good  I have discussed the results, Dx and Tx plan with the patient who expressed understanding and agree(s) with the plan. Discharge instructions discussed at great length. The patient was given strict return precautions who verbalized understanding of the instructions. No further questions at time of discharge.    ED Discharge Orders    None        Follow Up: Judith Part, MD [place] [id number] [phone]  Schedule an appointment as soon as possible for a visit        Leonette Monarch Grayce Sessions, MD 09/10/19 [phone]

## 2019-09-10 NOTE — ED Notes (Signed)
Patient transported to MRI 

## 2019-09-11 ENCOUNTER — Other Ambulatory Visit: Payer: Self-pay

## 2019-09-11 DIAGNOSIS — D5720 Sickle-cell/Hb-C disease without crisis: Secondary | ICD-10-CM

## 2019-09-11 NOTE — Telephone Encounter (Signed)
Refill request for bacflofen and to change to 10mg  and oxycodone. Please advise.

## 2019-09-13 ENCOUNTER — Other Ambulatory Visit: Payer: Self-pay | Admitting: Family Medicine

## 2019-09-13 ENCOUNTER — Telehealth: Payer: Self-pay

## 2019-09-13 DIAGNOSIS — D5720 Sickle-cell/Hb-C disease without crisis: Secondary | ICD-10-CM

## 2019-09-13 MED ORDER — BACLOFEN 5 MG PO TABS: 1.0000 | ORAL_TABLET | Freq: Every day | ORAL | 0 refills | Status: DC | PRN

## 2019-09-13 MED ORDER — OXYCODONE HCL 15 MG PO TABS: 15.0000 mg | ORAL_TABLET | Freq: Four times a day (QID) | ORAL | 0 refills | Status: DC | PRN

## 2019-09-13 NOTE — Telephone Encounter (Signed)
Error

## 2019-09-14 ENCOUNTER — Other Ambulatory Visit: Payer: Self-pay | Admitting: Family Medicine

## 2019-09-20 HISTORY — PX: EYE SURGERY: SHX253

## 2019-09-26 ENCOUNTER — Other Ambulatory Visit: Payer: Self-pay | Admitting: Family Medicine

## 2019-09-26 DIAGNOSIS — D5720 Sickle-cell/Hb-C disease without crisis: Secondary | ICD-10-CM

## 2019-09-26 MED ORDER — OXYCODONE HCL 15 MG PO TABS: 15.0000 mg | ORAL_TABLET | Freq: Four times a day (QID) | ORAL | 0 refills | Status: DC | PRN

## 2019-09-26 NOTE — Telephone Encounter (Signed)
Refill request for oxycodone. Please advise.  

## 2019-09-29 ENCOUNTER — Other Ambulatory Visit: Payer: Self-pay | Admitting: Family Medicine

## 2019-09-29 DIAGNOSIS — D5720 Sickle-cell/Hb-C disease without crisis: Secondary | ICD-10-CM

## 2019-10-02 ENCOUNTER — Other Ambulatory Visit: Payer: Self-pay | Admitting: Family Medicine

## 2019-10-02 DIAGNOSIS — Z79891 Long term (current) use of opiate analgesic: Secondary | ICD-10-CM

## 2019-10-02 DIAGNOSIS — D5720 Sickle-cell/Hb-C disease without crisis: Secondary | ICD-10-CM

## 2019-10-03 MED ORDER — MORPHINE SULFATE ER 30 MG PO TBCR: 30.0000 mg | EXTENDED_RELEASE_TABLET | Freq: Two times a day (BID) | ORAL | 0 refills | Status: DC

## 2019-10-03 NOTE — Telephone Encounter (Signed)
Refill request for ms contin

## 2019-10-03 NOTE — Telephone Encounter (Signed)
Refill request for baclofen. Please advise.

## 2019-10-09 ENCOUNTER — Other Ambulatory Visit: Payer: Self-pay

## 2019-10-09 ENCOUNTER — Other Ambulatory Visit: Payer: Self-pay | Admitting: Family Medicine

## 2019-10-09 DIAGNOSIS — D5720 Sickle-cell/Hb-C disease without crisis: Secondary | ICD-10-CM

## 2019-10-10 ENCOUNTER — Encounter: Payer: Self-pay | Admitting: Family Medicine

## 2019-10-10 ENCOUNTER — Other Ambulatory Visit: Payer: Self-pay | Admitting: Family Medicine

## 2019-10-10 DIAGNOSIS — D5720 Sickle-cell/Hb-C disease without crisis: Secondary | ICD-10-CM

## 2019-10-10 MED ORDER — OXYCODONE HCL 15 MG PO TABS: 15.0000 mg | ORAL_TABLET | Freq: Four times a day (QID) | ORAL | 0 refills | Status: DC | PRN

## 2019-10-10 NOTE — Telephone Encounter (Signed)
Please fill request if appropriate on behalf of previous PCP

## 2019-10-10 NOTE — Telephone Encounter (Signed)
Baclofen 5 MG refill

## 2019-10-11 ENCOUNTER — Other Ambulatory Visit: Payer: Self-pay | Admitting: Family Medicine

## 2019-10-11 DIAGNOSIS — D5720 Sickle-cell/Hb-C disease without crisis: Secondary | ICD-10-CM

## 2019-10-11 DIAGNOSIS — N3289 Other specified disorders of bladder: Secondary | ICD-10-CM

## 2019-10-11 MED ORDER — BACLOFEN 5 MG PO TABS: 1.0000 | ORAL_TABLET | Freq: Every day | ORAL | 0 refills | Status: DC | PRN

## 2019-10-12 ENCOUNTER — Other Ambulatory Visit: Payer: Self-pay | Admitting: Family Medicine

## 2019-10-15 MED ORDER — GABAPENTIN 100 MG PO CAPS: 100.0000 mg | ORAL_CAPSULE | Freq: Three times a day (TID) | ORAL | 0 refills | Status: DC

## 2019-10-23 ENCOUNTER — Other Ambulatory Visit: Payer: Self-pay | Admitting: Family Medicine

## 2019-10-23 DIAGNOSIS — D5720 Sickle-cell/Hb-C disease without crisis: Secondary | ICD-10-CM

## 2019-10-23 MED ORDER — OXYCODONE HCL 15 MG PO TABS: 15.0000 mg | ORAL_TABLET | Freq: Four times a day (QID) | ORAL | 0 refills | Status: DC | PRN

## 2019-10-23 NOTE — Telephone Encounter (Signed)
Patient requesting refill Oxycodone 15 MG ( [facility] on [address]).

## 2019-10-26 ENCOUNTER — Other Ambulatory Visit: Payer: Self-pay | Admitting: Family Medicine

## 2019-10-26 DIAGNOSIS — D5720 Sickle-cell/Hb-C disease without crisis: Secondary | ICD-10-CM

## 2019-10-26 MED ORDER — BACLOFEN 5 MG PO TABS: 1.0000 | ORAL_TABLET | Freq: Every day | ORAL | 0 refills | Status: AC | PRN

## 2019-10-30 ENCOUNTER — Other Ambulatory Visit: Payer: Self-pay

## 2019-10-30 DIAGNOSIS — Z79891 Long term (current) use of opiate analgesic: Secondary | ICD-10-CM

## 2019-10-30 DIAGNOSIS — D5720 Sickle-cell/Hb-C disease without crisis: Secondary | ICD-10-CM

## 2019-11-05 ENCOUNTER — Other Ambulatory Visit: Payer: Self-pay | Admitting: Family Medicine

## 2019-11-05 ENCOUNTER — Other Ambulatory Visit: Payer: Self-pay

## 2019-11-05 ENCOUNTER — Telehealth: Payer: Self-pay

## 2019-11-05 ENCOUNTER — Other Ambulatory Visit: Payer: Self-pay | Admitting: Internal Medicine

## 2019-11-05 DIAGNOSIS — D57219 Sickle-cell/Hb-C disease with crisis, unspecified: Secondary | ICD-10-CM

## 2019-11-05 DIAGNOSIS — Z79891 Long term (current) use of opiate analgesic: Secondary | ICD-10-CM

## 2019-11-05 DIAGNOSIS — D5720 Sickle-cell/Hb-C disease without crisis: Secondary | ICD-10-CM

## 2019-11-05 MED ORDER — IBUPROFEN 800 MG PO TABS: ORAL_TABLET | ORAL | 5 refills | Status: DC

## 2019-11-05 MED ORDER — MORPHINE SULFATE ER 30 MG PO TBCR: 30.0000 mg | EXTENDED_RELEASE_TABLET | Freq: Two times a day (BID) | ORAL | 0 refills | Status: DC

## 2019-11-05 NOTE — Telephone Encounter (Signed)
Refilled

## 2019-11-05 NOTE — Telephone Encounter (Signed)
Refill request for Morphine. Please advise.  

## 2019-11-06 ENCOUNTER — Other Ambulatory Visit: Payer: Self-pay | Admitting: Family Medicine

## 2019-11-06 DIAGNOSIS — D5720 Sickle-cell/Hb-C disease without crisis: Secondary | ICD-10-CM

## 2019-11-07 ENCOUNTER — Other Ambulatory Visit: Payer: Self-pay | Admitting: Family Medicine

## 2019-11-07 DIAGNOSIS — D5720 Sickle-cell/Hb-C disease without crisis: Secondary | ICD-10-CM

## 2019-11-07 MED ORDER — OXYCODONE HCL 15 MG PO TABS: 15.0000 mg | ORAL_TABLET | Freq: Four times a day (QID) | ORAL | 0 refills | Status: DC | PRN

## 2019-11-07 NOTE — Telephone Encounter (Signed)
Refill request for oxycodone. Please advise.  

## 2019-11-17 ENCOUNTER — Other Ambulatory Visit: Payer: Self-pay

## 2019-11-19 ENCOUNTER — Other Ambulatory Visit: Payer: Self-pay

## 2019-11-19 MED ORDER — GABAPENTIN 100 MG PO CAPS: 100.0000 mg | ORAL_CAPSULE | Freq: Three times a day (TID) | ORAL | 0 refills | Status: DC

## 2019-11-22 ENCOUNTER — Other Ambulatory Visit: Payer: Self-pay | Admitting: Family Medicine

## 2019-11-22 ENCOUNTER — Telehealth: Payer: Self-pay | Admitting: Internal Medicine

## 2019-11-22 NOTE — Telephone Encounter (Signed)
Pt will nee a appointment to get any med refill per L Smith Robert

## 2019-11-22 NOTE — Progress Notes (Signed)
Patient has not had office visit since 8/20 or UDS since 07/18/2019. Last UDS was positive for illicit substances. Patient will warrant office visit for medication management and UDS prior to receiving prescription opiates from this clinic.   Donia Pounds  APRN, MSN, FNP-C [facility] [address] [place], [place] [address] [phone]

## 2019-11-27 ENCOUNTER — Other Ambulatory Visit: Payer: Self-pay

## 2019-11-27 ENCOUNTER — Ambulatory Visit (INDEPENDENT_AMBULATORY_CARE_PROVIDER_SITE_OTHER): Payer: Medicaid Other | Admitting: Family Medicine

## 2019-11-27 ENCOUNTER — Encounter: Payer: Self-pay | Admitting: Family Medicine

## 2019-11-27 VITALS — BP 126/67 | HR 93 | Temp 98.3°F | Resp 18 | Ht 62.0 in | Wt 148.0 lb

## 2019-11-27 DIAGNOSIS — G894 Chronic pain syndrome: Secondary | ICD-10-CM

## 2019-11-27 DIAGNOSIS — D5720 Sickle-cell/Hb-C disease without crisis: Secondary | ICD-10-CM

## 2019-11-27 DIAGNOSIS — J301 Allergic rhinitis due to pollen: Secondary | ICD-10-CM

## 2019-11-27 LAB — POCT URINALYSIS DIPSTICK
Bilirubin, UA: NEGATIVE
Blood, UA: NEGATIVE
Glucose, UA: NEGATIVE
Ketones, UA: NEGATIVE
Leukocytes, UA: NEGATIVE
Nitrite, UA: NEGATIVE
Protein, UA: NEGATIVE
Spec Grav, UA: 1.02 (ref 1.010–1.025)
Urobilinogen, UA: 0.2 E.U./dL
pH, UA: 5.5 (ref 5.0–8.0)

## 2019-11-27 MED ORDER — LEVOCETIRIZINE DIHYDROCHLORIDE 5 MG PO TABS: ORAL_TABLET | ORAL | 1 refills | Status: DC

## 2019-11-27 NOTE — Progress Notes (Signed)
Established Patient Office Visit  Subjective:  Patient ID: Hannah Hawkins, female    DOB: November 13, 1986  Age: 33 y.o. MRN: [id number]  CC:  Chief Complaint  Patient presents with  . Sickle Cell Anemia    HPI Hannah Hawkins, a 33 year old female with a medical history significant for sickle cell disease type [place], chronic pain syndrome, opiate dependence and opiate tolerance, history of polysubstance abuse, history of allergic rhinitis, and history of anemia of chronic disease presents for follow-up of sickle cell disease and medication management. Patient states that she has been having increased pain over the past week.  She attributes pain to changes in weather.  She states that she has been trying to manage at home with ibuprofen being that she is out of opiate pain medication.  Patient recently made a request for opiates, she has not been evaluated in clinic since July 2020.  Also patient has not had a urine drug screen since 07/18/2019, and at that time it was positive for cocaine.  Patient denies current illicit drug use. Her current pain intensity is 5-6/10 primarily to low back and lower extremities.  She said that she has had some swelling to extremities over the last several weeks, which has dissipated. She says that she is out of some medications and is requesting refills. She denies recent travel, sick contacts, or exposure to COVID-19.  She also denies headache, chest pain, shortness of breath, persistent cough, urinary symptoms, nausea, vomiting, or diarrhea.   Past Medical History:  Diagnosis Date  . Sickle cell anemia (HCC)     Past Surgical History:  Procedure Laterality Date  . EYE SURGERY  03/09/2017   left eye  . EYE SURGERY Right 09/2019   right hemerage     Family History  Problem Relation Age of Onset  . Diabetes Father     Social History   Socioeconomic History  . Marital status: Single    Spouse name: Not on file  . Number of children: Not on file   . Years of education: Not on file  . Highest education level: Not on file  Occupational History  . Not on file  Social Needs  . Financial resource strain: Not on file  . Food insecurity    Worry: Not on file    Inability: Not on file  . Transportation needs    Medical: Not on file    Non-medical: Not on file  Tobacco Use  . Smoking status: Former Smoker    Types: Cigarettes  . Smokeless tobacco: Never Used  Substance and Sexual Activity  . Alcohol use: Not Currently  . Drug use: No  . Sexual activity: Yes  Lifestyle  . Physical activity    Days per week: Not on file    Minutes per session: Not on file  . Stress: Not on file  Relationships  . Social Herbalist on phone: Not on file    Gets together: Not on file    Attends religious service: Not on file    Active member of club or organization: Not on file    Attends meetings of clubs or organizations: Not on file    Relationship status: Not on file  . Intimate partner violence    Fear of current or ex partner: Not on file    Emotionally abused: Not on file    Physically abused: Not on file    Forced sexual activity: Not on file  Other Topics Concern  .  Not on file  Social History Narrative  . Not on file    Outpatient Medications Prior to Visit  Medication Sig Dispense Refill  . ergocalciferol (VITAMIN D2) 1.25 MG (50000 UT) capsule Take 1 capsule (50,000 Units total) by mouth once a week. 12 capsule 3  . ibuprofen (ADVIL) 800 MG tablet TAKE 1 TABLET(800 MG) BY MOUTH EVERY 8 HOURS AS NEEDED FOR MODERATE PAIN 30 tablet 5  . morphine (MS CONTIN) 30 MG 12 hr tablet Take 1 tablet (30 mg total) by mouth every 12 (twelve) hours. 60 tablet 0  . NARCAN 4 MG/0.1ML LIQD nasal spray kit Place 4 mg into the nose once as needed (overdose).   0  . pantoprazole (PROTONIX) 40 MG tablet Take 1 tablet (40 mg total) by mouth daily. 30 tablet 3  . promethazine (PHENERGAN) 12.5 MG tablet TAKE 1 TABLET(12.5 MG) BY MOUTH EVERY  4 HOURS AS NEEDED FOR NAUSEA OR VOMITING 30 tablet 0  . gabapentin (NEURONTIN) 100 MG capsule Take 1 capsule (100 mg total) by mouth 3 (three) times daily. (Patient not taking: Reported on 11/27/2019) 90 capsule 0  . hydroxyurea (HYDREA) 500 MG capsule TAKE 2 CAPSULES(1000 MG) BY MOUTH DAILY (Patient not taking: Reported on 11/27/2019) 60 capsule 2  . Lactobacillus Rhamnosus, GG, (CULTURELLE) CAPS Take 1 capsule by mouth daily. Take 1 cap po BID x 7 days and then decrease to once a day. (Patient not taking: Reported on 05/28/2019) 45 capsule 0  . levocetirizine (XYZAL) 5 MG tablet TAKE 1 TABLET(5 MG) BY MOUTH EVERY EVENING (Patient not taking: Reported on 11/27/2019) 90 tablet 1  . ondansetron (ZOFRAN) 4 MG tablet TAKE 1 TABLET(4 MG) BY MOUTH EVERY 8 HOURS AS NEEDED FOR NAUSEA OR VOMITING (Patient not taking: Reported on 11/27/2019) 30 tablet 3   No facility-administered medications prior to visit.     Allergies  Allergen Reactions  . Penicillins Swelling and Rash    Has patient had a PCN reaction causing immediate rash, facial/tongue/throat swelling, SOB or lightheadedness with hypotension: Yes Has patient had a PCN reaction causing severe rash involving mucus membranes or skin necrosis: Yes Has patient had a PCN reaction that required hospitalization No Has patient had a PCN reaction occurring within the last 10 years: no If all of the above answers are "NO", then may proceed with Cephalosporin use.     ROS Review of Systems  Constitutional: Negative for fatigue and fever.  HENT: Negative.   Eyes: Negative.   Respiratory: Negative.   Cardiovascular: Negative.   Gastrointestinal: Negative.   Endocrine: Negative.   Genitourinary: Negative.   Musculoskeletal: Positive for back pain and joint swelling.  Allergic/Immunologic: Positive for environmental allergies.  Neurological: Negative.   Psychiatric/Behavioral: Negative.       Objective:    Physical Exam  Constitutional: She is  oriented to person, place, and time. She appears well-developed and well-nourished.  HENT:  Head: Normocephalic and atraumatic.  Eyes: Pupils are equal, round, and reactive to light. Scleral icterus is present.  Cardiovascular: Normal rate and regular rhythm.  Pulmonary/Chest: Effort normal and breath sounds normal.  Abdominal: Soft.  Musculoskeletal:        General: Normal range of motion.     Cervical back: Normal range of motion.  Neurological: She is alert and oriented to person, place, and time.  Skin: Skin is warm and dry.  Psychiatric: She has a normal mood and affect. Her behavior is normal. Judgment normal.  Flat affect    BP  126/67 (BP Location: Right Arm, Patient Position: Sitting, Cuff Size: Normal)   Pulse 93   Temp 98.3 F (36.8 C) (Oral)   Resp 18   Ht 5' 2" (1.575 m)   Wt 148 lb (67.1 kg)   LMP 11/12/2019   SpO2 100%   BMI 27.07 kg/m  Wt Readings from Last 3 Encounters:  11/27/19 148 lb (67.1 kg)  07/27/19 139 lb (63 kg)  07/17/19 146 lb (66.2 kg)     There are no preventive care reminders to display for this patient.  There are no preventive care reminders to display for this patient.  Lab Results  Component Value Date   TSH 0.56 09/30/2017   Lab Results  Component Value Date   WBC 8.3 09/09/2019   HGB 10.1 (L) 09/09/2019   HCT 27.3 (L) 09/09/2019   MCV 81.5 09/09/2019   PLT 248 09/09/2019   Lab Results  Component Value Date   NA 140 09/09/2019   K 3.4 (L) 09/09/2019   CO2 29 09/09/2019   GLUCOSE 81 09/09/2019   BUN 6 09/09/2019   CREATININE 0.69 09/09/2019   BILITOT 0.3 09/09/2019   ALKPHOS 44 09/09/2019   AST 22 09/09/2019   ALT 12 09/09/2019   PROT 6.3 (L) 09/09/2019   ALBUMIN 3.5 09/09/2019   CALCIUM 8.8 (L) 09/09/2019   ANIONGAP 8 09/09/2019   No results found for: CHOL No results found for: HDL No results found for: LDLCALC No results found for: TRIG No results found for: CHOLHDL No results found for: HGBA1C     Assessment & Plan:   Problem List Items Addressed This Visit      Other   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  She states that she has plenty of this medication at home and plans to restart this week.  Patient advised to follow-up in 4 weeks for labs. Continue folic acid 1 mg daily to prevent aplastic bone marrow crises.   Pulmonary evaluation - Patient denies severe recurrent wheezes, shortness of breath with exercise, or persistent cough. If these symptoms develop, pulmonary function tests with spirometry will be ordered, and if abnormal, plan on referral to Pulmonology for further evaluation.  Cardiac - Routine screening for pulmonary hypertension is not recommended.  Eye - High risk of proliferative retinopathy.  Patient is up-to-date with eye exam.  She recently had surgery.   Immunization status - She declines vaccines today.   Acute and chronic painful episodes -patient is on both oxycodone 15 mg every 6 hours as needed for severe pain and MS Contin 30 mg every 12 hours.  Patient has been receiving medications consistently over the past several months.  According to [facility]  last prescription for oxycodone was filled on 11/07/2019 with a quantity of #60 for a total of 15 days and MS Contin was  filled on 11/05/2019 for a total of 30 days.  Agreed to continue current medication regimen after the review of patient's drug screen.  Patient has a medication agreement plan in place, which states that patient is receiving opiate medications warrant frequent appointments for medication management.  All patients receiving scheduled to narcotics must be seen for follow-up within 1-2 months of prescription being requested.  I reviewed the terms of the pain agreement on today, patient had no response.    - CBC with Differential - Vitamin D, 25-hydroxy  - Urinalysis Dipstick - Comprehensive metabolic panel  Chronic pain syndrome Reviewed PDMP, no inconsistencies noted at this time.  - [id number] 11+Oxyco+Alc+Crt-Bund  Non-seasonal allergic rhinitis due to pollen - levocetirizine (XYZAL) 5 MG tablet; TAKE 1 TABLET(5 MG) BY MOUTH EVERY EVENING  Dispense: 90 tablet; Refill: 1   Follow-up: Return in about 2 months (around 01/28/2020).    Donia Pounds  APRN, MSN, FNP-C Patient [facility] [address] [place], [place] [address] [phone]

## 2019-11-28 ENCOUNTER — Telehealth: Payer: Self-pay | Admitting: Family Medicine

## 2019-11-28 LAB — COMPREHENSIVE METABOLIC PANEL
ALT: 11 IU/L (ref 0–32)
AST: 15 IU/L (ref 0–40)
Albumin/Globulin Ratio: 1.5 (ref 1.2–2.2)
Albumin: 3.9 g/dL (ref 3.8–4.8)
Alkaline Phosphatase: 62 IU/L (ref 39–117)
BUN/Creatinine Ratio: 13 (ref 9–23)
BUN: 10 mg/dL (ref 6–20)
Bilirubin Total: 0.4 mg/dL (ref 0.0–1.2)
CO2: 25 mmol/L (ref 20–29)
Calcium: 9.3 mg/dL (ref 8.7–10.2)
Chloride: 103 mmol/L (ref 96–106)
Creatinine, Ser: 0.78 mg/dL (ref 0.57–1.00)
GFR calc Af Amer: 114 mL/min/{1.73_m2} (ref >59)
GFR calc non Af Amer: 99 mL/min/{1.73_m2} (ref >59)
Globulin, Total: 2.6 g/dL (ref 1.5–4.5)
Glucose: 84 mg/dL (ref 65–99)
Potassium: 4.4 mmol/L (ref 3.5–5.2)
Sodium: 140 mmol/L (ref 134–144)
Total Protein: 6.5 g/dL (ref 6.0–8.5)

## 2019-11-28 LAB — CBC WITH DIFFERENTIAL/PLATELET
Basophils Absolute: 0.1 10*3/uL (ref 0.0–0.2)
Basos: 1 %
EOS (ABSOLUTE): 0.4 10*3/uL (ref 0.0–0.4)
Eos: 6 %
Hematocrit: 32.1 % — ABNORMAL LOW (ref 34.0–46.6)
Hemoglobin: 10.7 g/dL — ABNORMAL LOW (ref 11.1–15.9)
Immature Grans (Abs): 0 10*3/uL (ref 0.0–0.1)
Immature Granulocytes: 0 %
Lymphocytes Absolute: 3.8 10*3/uL — ABNORMAL HIGH (ref 0.7–3.1)
Lymphs: 49 %
MCH: 28.8 pg (ref 26.6–33.0)
MCHC: 33.3 g/dL (ref 31.5–35.7)
MCV: 87 fL (ref 79–97)
Monocytes Absolute: 0.7 10*3/uL (ref 0.1–0.9)
Monocytes: 9 %
NRBC: 7 % — ABNORMAL HIGH (ref 0–0)
Neutrophils Absolute: 2.7 10*3/uL (ref 1.4–7.0)
Neutrophils: 35 %
Platelets: 485 10*3/uL — ABNORMAL HIGH (ref 150–450)
RBC: 3.71 x10E6/uL — ABNORMAL LOW (ref 3.77–5.28)
RDW: 15.8 % — ABNORMAL HIGH (ref 11.7–15.4)
WBC: 7.7 10*3/uL (ref 3.4–10.8)

## 2019-11-28 LAB — DRUG SCREEN 764883 11+OXYCO+ALC+CRT-BUND
Amphetamines, Urine: NEGATIVE ng/mL
BENZODIAZ UR QL: NEGATIVE ng/mL
Barbiturate: NEGATIVE ng/mL
Cannabinoid Quant, Ur: NEGATIVE ng/mL
Cocaine (Metabolite): NEGATIVE ng/mL
Creatinine: 204 mg/dL (ref 20.0–300.0)
Ethanol: NEGATIVE %
Meperidine: NEGATIVE ng/mL
Methadone Screen, Urine: NEGATIVE ng/mL
OPIATE SCREEN URINE: NEGATIVE ng/mL
Oxycodone/Oxymorphone, Urine: NEGATIVE ng/mL
Phencyclidine: NEGATIVE ng/mL
Propoxyphene: NEGATIVE ng/mL
Tramadol: NEGATIVE ng/mL
pH, Urine: 5.6 (ref 4.5–8.9)

## 2019-11-28 LAB — VITAMIN D 25 HYDROXY (VIT D DEFICIENCY, FRACTURES): Vit D, 25-Hydroxy: 41.1 ng/mL (ref 30.0–100.0)

## 2019-11-29 ENCOUNTER — Encounter: Payer: Self-pay | Admitting: Family Medicine

## 2019-11-29 ENCOUNTER — Telehealth (HOSPITAL_COMMUNITY): Payer: Self-pay | Admitting: General Practice

## 2019-11-29 ENCOUNTER — Other Ambulatory Visit: Payer: Self-pay | Admitting: Family Medicine

## 2019-11-29 DIAGNOSIS — G894 Chronic pain syndrome: Secondary | ICD-10-CM

## 2019-11-29 MED ORDER — OXYCODONE HCL 15 MG PO TABS: 15.0000 mg | ORAL_TABLET | Freq: Four times a day (QID) | ORAL | 0 refills | Status: DC | PRN

## 2019-11-29 NOTE — Progress Notes (Signed)
PDMP reviewed prior to prescribing opiate medications, no inconsistencies noted. Urine drug screen pending.   Meds ordered this encounter  Medications  . oxyCODONE (ROXICODONE) 15 MG immediate release tablet    Sig: Take 1 tablet (15 mg total) by mouth every 6 (six) hours as needed for pain.    Dispense:  60 tablet    Refill:  0    Order Specific Question:   Supervising Provider    Answer:   Tresa Garter [[id number]]    Donia Pounds  APRN, MSN, FNP-C Patient [facility] [address] [place], [place] [address] [phone]

## 2019-11-29 NOTE — Telephone Encounter (Signed)
Patient called, complained of pain in the right leg rated at 7/10. In addition, patient complained of hands, feet and ankles swelling Denied chest pain, fever, diarrhea, abdominal pain, nausea/vomitting. Screened negative for Covid-19 symptoms. Admitted to having means of transportation without driving self after treatment. Last took Oxycontin yesterday. Patient told the [facility] will not be able to address the swellings. Going to the ER will be more appropriate. For pain control, patient told urine sample will be collected to which patient became agitated and hung up the phone.Provider will call patient back for consultation.

## 2019-11-29 NOTE — Telephone Encounter (Signed)
done

## 2019-12-04 ENCOUNTER — Other Ambulatory Visit: Payer: Self-pay

## 2019-12-04 DIAGNOSIS — Z79891 Long term (current) use of opiate analgesic: Secondary | ICD-10-CM

## 2019-12-04 DIAGNOSIS — D5720 Sickle-cell/Hb-C disease without crisis: Secondary | ICD-10-CM

## 2019-12-04 NOTE — Telephone Encounter (Signed)
Please fill if an appropriate request.

## 2019-12-05 ENCOUNTER — Telehealth: Payer: Self-pay | Admitting: Family Medicine

## 2019-12-05 ENCOUNTER — Other Ambulatory Visit: Payer: Self-pay | Admitting: Family Medicine

## 2019-12-05 ENCOUNTER — Other Ambulatory Visit: Payer: Self-pay

## 2019-12-05 DIAGNOSIS — Z79891 Long term (current) use of opiate analgesic: Secondary | ICD-10-CM

## 2019-12-05 DIAGNOSIS — D5720 Sickle-cell/Hb-C disease without crisis: Secondary | ICD-10-CM

## 2019-12-05 MED ORDER — MORPHINE SULFATE ER 30 MG PO TBCR: 30.0000 mg | EXTENDED_RELEASE_TABLET | Freq: Two times a day (BID) | ORAL | 0 refills | Status: DC

## 2019-12-05 NOTE — Progress Notes (Signed)
PDMP reviewed prior to prescribing opiate medication, no inconsistencies noted.   Meds ordered this encounter  Medications  . morphine (MS CONTIN) 30 MG 12 hr tablet    Sig: Take 1 tablet (30 mg total) by mouth every 12 (twelve) hours.    Dispense:  60 tablet    Refill:  0    Order Specific Question:   Supervising Provider    Answer:   Tresa Garter [[id number]]    Donia Pounds  APRN, MSN, FNP-C Patient [facility] [address] [place], [place] [address] [phone]

## 2019-12-05 NOTE — Telephone Encounter (Signed)
Called and spoke with patient, advised that medication refill has been sent to pharmacy and that she would need to come in for a lab only appointment on Monday. Patient states she can not come on Monday the 21st but will be able to come on 12/12/2019 so this has been scheduled. Thanks!

## 2019-12-05 NOTE — Telephone Encounter (Signed)
-----   Message from Dorena Dew, [place] sent at 12/05/2019  2:45 PM EST ----- Regarding: lab appointment Please have patient schedule a lab only appointment for 12/10/2019. Medication requested has been sent to her pharmacy.    Donia Pounds  APRN, MSN, FNP-C Patient [facility] [address] [place], [place] [address] [phone]

## 2019-12-12 ENCOUNTER — Telehealth: Payer: Self-pay | Admitting: Family Medicine

## 2019-12-12 ENCOUNTER — Other Ambulatory Visit: Payer: Medicaid Other

## 2019-12-12 ENCOUNTER — Other Ambulatory Visit: Payer: Self-pay | Admitting: Family Medicine

## 2019-12-12 DIAGNOSIS — G894 Chronic pain syndrome: Secondary | ICD-10-CM

## 2019-12-12 MED ORDER — OXYCODONE HCL 15 MG PO TABS: 15.0000 mg | ORAL_TABLET | Freq: Four times a day (QID) | ORAL | 0 refills | Status: DC | PRN

## 2019-12-12 NOTE — Progress Notes (Signed)
Patient missed her medication management appointment on today, yet she is requesting pain medication prescription. Will send 7 days of opiate medications to pharmacy. Patient will need to reschedule at first availability.  Reviewed PDMP substance reporting system prior to prescribing opiate medications. No inconsistencies noted.  Meds ordered this encounter  Medications  . oxyCODONE (ROXICODONE) 15 MG immediate release tablet    Sig: Take 1 tablet (15 mg total) by mouth every 6 (six) hours as needed for up to 7 days for pain.    Dispense:  28 tablet    Refill:  0    Order Specific Question:   Supervising Provider    Answer:   Tresa Garter [[id number]]    Donia Pounds  APRN, MSN, FNP-C Patient [facility] [address] [place], [place] [address] [phone]

## 2019-12-17 NOTE — Telephone Encounter (Signed)
done

## 2019-12-18 NOTE — Progress Notes (Signed)
Opened in error

## 2019-12-19 ENCOUNTER — Telehealth: Payer: Self-pay | Admitting: Family Medicine

## 2019-12-19 ENCOUNTER — Telehealth: Payer: Self-pay

## 2019-12-19 ENCOUNTER — Other Ambulatory Visit: Payer: Self-pay | Admitting: Family Medicine

## 2019-12-19 ENCOUNTER — Other Ambulatory Visit: Payer: Self-pay

## 2019-12-19 MED ORDER — BACLOFEN 10 MG PO TABS: 5.0000 mg | ORAL_TABLET | Freq: Three times a day (TID) | ORAL | 0 refills | Status: DC

## 2019-12-19 MED ORDER — GABAPENTIN 100 MG PO CAPS: 100.0000 mg | ORAL_CAPSULE | Freq: Three times a day (TID) | ORAL | 0 refills | Status: DC

## 2019-12-19 NOTE — Telephone Encounter (Signed)
Refill request for baclofen 5mg . This is not on current list. Please advise. Thanks!

## 2019-12-19 NOTE — Progress Notes (Signed)
Meds ordered this encounter  Medications  . baclofen (LIORESAL) 10 MG tablet    Sig: Take 0.5 tablets (5 mg total) by mouth 3 (three) times daily.    Dispense:  90 each    Refill:  0    Order Specific Question:   Supervising Provider    Answer:   Tresa Garter [[id number]]    Donia Pounds  APRN, MSN, FNP-C Patient [facility] [address] [place], [place] [address] [phone]

## 2019-12-24 NOTE — Telephone Encounter (Signed)
Sent to Np °

## 2019-12-25 ENCOUNTER — Other Ambulatory Visit: Payer: Medicaid Other

## 2020-01-03 ENCOUNTER — Encounter (HOSPITAL_COMMUNITY): Payer: Self-pay | Admitting: Emergency Medicine

## 2020-01-03 ENCOUNTER — Other Ambulatory Visit: Payer: Self-pay

## 2020-01-03 DIAGNOSIS — M791 Myalgia, unspecified site: Secondary | ICD-10-CM | POA: Diagnosis present

## 2020-01-03 DIAGNOSIS — Z79899 Other long term (current) drug therapy: Secondary | ICD-10-CM | POA: Insufficient documentation

## 2020-01-03 DIAGNOSIS — Z87891 Personal history of nicotine dependence: Secondary | ICD-10-CM | POA: Insufficient documentation

## 2020-01-03 DIAGNOSIS — D57819 Other sickle-cell disorders with crisis, unspecified: Secondary | ICD-10-CM | POA: Insufficient documentation

## 2020-01-03 NOTE — ED Triage Notes (Signed)
The patient presents with sickle cell related pain. She complains of generalized pain, dizziness, weakness, nausea and shortness of breath. Symptoms began yesterday and have worsened since. EMS administered of Fentanyl and 4mg  of Zofran. She is A&O x 4.    EMS vitals: 70 HR 128/67 BP 98% O2 sat on RA 97.7 Temp

## 2020-01-04 ENCOUNTER — Emergency Department (HOSPITAL_COMMUNITY)
Admission: EM | Admit: 2020-01-04 | Discharge: 2020-01-04 | Disposition: A | Discharge status: Home / Self Care | Payer: Medicaid Other | Attending: Emergency Medicine | Admitting: Emergency Medicine

## 2020-01-04 ENCOUNTER — Emergency Department (HOSPITAL_COMMUNITY): Payer: Medicaid Other

## 2020-01-04 ENCOUNTER — Other Ambulatory Visit: Payer: Self-pay

## 2020-01-04 DIAGNOSIS — D5700 Hb-SS disease with crisis, unspecified: Secondary | ICD-10-CM

## 2020-01-04 LAB — CBC WITH DIFFERENTIAL/PLATELET
Abs Immature Granulocytes: 0.02 10*3/uL (ref 0.00–0.07)
Basophils Absolute: 0 10*3/uL (ref 0.0–0.1)
Basophils Relative: 1 %
Eosinophils Absolute: 0.6 10*3/uL — ABNORMAL HIGH (ref 0.0–0.5)
Eosinophils Relative: 8 %
HCT: 32.2 % — ABNORMAL LOW (ref 36.0–46.0)
Hemoglobin: 11.7 g/dL — ABNORMAL LOW (ref 12.0–15.0)
Immature Granulocytes: 0 %
Lymphocytes Relative: 34 %
Lymphs Abs: 2.5 10*3/uL (ref 0.7–4.0)
MCH: 29.5 pg (ref 26.0–34.0)
MCHC: 36.3 g/dL — ABNORMAL HIGH (ref 30.0–36.0)
MCV: 81.1 fL (ref 80.0–100.0)
Monocytes Absolute: 0.5 10*3/uL (ref 0.1–1.0)
Monocytes Relative: 7 %
Neutro Abs: 3.7 10*3/uL (ref 1.7–7.7)
Neutrophils Relative %: 50 %
Platelets: 323 10*3/uL (ref 150–400)
RBC: 3.97 MIL/uL (ref 3.87–5.11)
RDW: 14.4 % (ref 11.5–15.5)
WBC: 7.3 10*3/uL (ref 4.0–10.5)
nRBC: 1.5 % — ABNORMAL HIGH (ref 0.0–0.2)

## 2020-01-04 LAB — COMPREHENSIVE METABOLIC PANEL
ALT: 13 U/L (ref 0–44)
AST: 16 U/L (ref 15–41)
Albumin: 3.9 g/dL (ref 3.5–5.0)
Alkaline Phosphatase: 48 U/L (ref 38–126)
Anion gap: 4 — ABNORMAL LOW (ref 5–15)
BUN: 7 mg/dL (ref 6–20)
CO2: 26 mmol/L (ref 22–32)
Calcium: 8.7 mg/dL — ABNORMAL LOW (ref 8.9–10.3)
Chloride: 109 mmol/L (ref 98–111)
Creatinine, Ser: 0.66 mg/dL (ref 0.44–1.00)
GFR calc Af Amer: >60 mL/min (ref >60)
GFR calc non Af Amer: >60 mL/min (ref >60)
Glucose, Bld: 119 mg/dL — ABNORMAL HIGH (ref 70–99)
Potassium: 3.4 mmol/L — ABNORMAL LOW (ref 3.5–5.1)
Sodium: 139 mmol/L (ref 135–145)
Total Bilirubin: 0.7 mg/dL (ref 0.3–1.2)
Total Protein: 7.1 g/dL (ref 6.5–8.1)

## 2020-01-04 LAB — RETICULOCYTES
Immature Retic Fract: 31.2 % — ABNORMAL HIGH (ref 2.3–15.9)
RBC.: 3.87 MIL/uL (ref 3.87–5.11)
Retic Count, Absolute: 132.7 10*3/uL (ref 19.0–186.0)
Retic Ct Pct: 3.4 % — ABNORMAL HIGH (ref 0.4–3.1)

## 2020-01-04 MED ORDER — HYDROMORPHONE HCL 2 MG/ML IJ SOLN: 2.0000 mg | INTRAMUSCULAR | Status: DC

## 2020-01-04 MED ORDER — HYDROMORPHONE HCL 2 MG/ML IJ SOLN
2.0000 mg | INTRAMUSCULAR | Status: AC
  Administered 2020-01-04: 2 mg via INTRAVENOUS
  Filled 2020-01-04: qty 1

## 2020-01-04 MED ORDER — SODIUM CHLORIDE 0.45 % IV SOLN: Freq: Once | INTRAVENOUS | Status: AC

## 2020-01-04 MED ORDER — OXYCODONE HCL 15 MG PO TABS: 15.0000 mg | ORAL_TABLET | ORAL | 0 refills | Status: DC | PRN

## 2020-01-04 MED ORDER — DIPHENHYDRAMINE HCL 50 MG/ML IJ SOLN
25.0000 mg | Freq: Once | INTRAMUSCULAR | Status: AC
  Administered 2020-01-04: 25 mg via INTRAVENOUS
  Filled 2020-01-04: qty 1

## 2020-01-04 MED ORDER — PROMETHAZINE HCL 25 MG PO TABS
25.0000 mg | ORAL_TABLET | ORAL | Status: DC | PRN
  Administered 2020-01-04: 25 mg via ORAL
  Filled 2020-01-04: qty 1

## 2020-01-04 NOTE — ED Provider Notes (Signed)
[facility] DEPT Provider Note: Georgena Spurling, MD, FACEP  CSN: [id number] MRN: [id number] ARRIVAL: 01/03/20 at 2220 ROOM: [facility]  Sickle Cell Pain Crisis   HISTORY OF PRESENT ILLNESS  01/04/20 3:46 AM Hannah Hawkins is a 34 y.o. female with sickle cell disease but without frequent visits to [facility] health emergency department.  She is here with sickle cell related pain.  The pain is generalized, aching and sharp.  She describes her pain as severe.  It has not been relieved with her home medications.  She has had associated dizziness, weakness, nausea and shortness of breath.  She was given 150 mcg of fentanyl and 4 mg of Zofran by EMS prior to arrival with partial relief.  She has had decreased oral intake and believes she is dehydrated.  Her lips are chapped which she attributes to dehydration.   Past Medical History:  Diagnosis Date  . Sickle cell anemia (HCC)     Past Surgical History:  Procedure Laterality Date  . EYE SURGERY  03/09/2017   left eye  . EYE SURGERY Right 09/2019   right hemerage     Family History  Problem Relation Age of Onset  . Diabetes Father     Social History   Tobacco Use  . Smoking status: Former Smoker    Types: Cigarettes  . Smokeless tobacco: Never Used  Substance Use Topics  . Alcohol use: Not Currently  . Drug use: No    Prior to Admission medications   Medication Sig Start Date End Date Taking? Authorizing Provider  baclofen (LIORESAL) 10 MG tablet Take 0.5 tablets (5 mg total) by mouth 3 (three) times daily. 12/19/19  Yes Dorena Dew, FNP  ergocalciferol (VITAMIN D2) 1.25 MG (50000 UT) capsule Take 1 capsule (50,000 Units total) by mouth once a week. 05/10/19  Yes Lanae Boast, FNP  gabapentin (NEURONTIN) 100 MG capsule Take 1 capsule (100 mg total) by mouth 3 (three) times daily. 12/19/19  Yes Jegede, Marlena Clipper, MD  ibuprofen (ADVIL) 800 MG tablet TAKE 1 TABLET(800 MG) BY MOUTH EVERY 8 HOURS AS  NEEDED FOR MODERATE PAIN Patient taking differently: Take 800 mg by mouth every 8 (eight) hours as needed for moderate pain.  11/05/19  Yes Jegede, Marlena Clipper, MD  levocetirizine (XYZAL) 5 MG tablet TAKE 1 TABLET(5 MG) BY MOUTH EVERY EVENING Patient taking differently: Take 5 mg by mouth every evening.  11/27/19  Yes Dorena Dew, FNP  morphine (MS CONTIN) 30 MG 12 hr tablet Take 1 tablet (30 mg total) by mouth every 12 (twelve) hours. 12/05/19 01/04/20 Yes Dorena Dew, FNP  NARCAN 4 MG/0.1ML LIQD nasal spray kit Place 4 mg into the nose once as needed (overdose).  12/22/17  Yes [provider]  pantoprazole (PROTONIX) 40 MG tablet Take 1 tablet (40 mg total) by mouth daily. 05/10/19  Yes Lanae Boast, FNP  promethazine (PHENERGAN) 12.5 MG tablet TAKE 1 TABLET(12.5 MG) BY MOUTH EVERY 4 HOURS AS NEEDED FOR NAUSEA OR VOMITING Patient taking differently: Take 12.5 mg by mouth every 4 (four) hours as needed for nausea or vomiting.  09/14/19  Yes Jegede, [name], MD  hydroxyurea (HYDREA) 500 MG capsule TAKE 2 CAPSULES(1000 MG) BY MOUTH DAILY Patient not taking: Reported on 11/27/2019 10/01/19   Tresa Garter, MD  oxyCODONE (ROXICODONE) 15 MG immediate release tablet Take 1 tablet (15 mg total) by mouth every 4 (four) hours as needed for pain. 01/04/20   Prescilla Monger, Jenny Reichmann,  MD    Allergies Penicillins   REVIEW OF SYSTEMS  Negative except as noted here or in the History of Present Illness.   PHYSICAL EXAMINATION  Initial Vital Signs Blood pressure (!) 134/97, pulse 85, temperature 98 F (36.7 C), temperature source Oral, resp. rate 18, height _0  (1.6 m), weight 67.1 kg, SpO2 98 %.  Examination General: Well-developed, well-nourished female in no acute distress; appearance consistent with age of record HENT: normocephalic; atraumatic Eyes: pupils equal, round and reactive to light; extraocular muscles intact Neck: supple Heart: regular rate and rhythm; no murmurs,  rubs or gallops Lungs: clear to auscultation bilaterally Abdomen: soft; nondistended; nontender; no masses or hepatosplenomegaly; bowel sounds present Extremities: No deformity; full range of motion; pulses normal Neurologic: Awake, alert and oriented; motor function intact in all extremities and symmetric; no facial droop Skin: Warm and dry Psychiatric: Normal mood and affect   RESULTS  Summary of this visit's results, reviewed and interpreted by myself:  EKG Interpretation:  Date & Time: 01/04/2020 12:42 AM  Rate: 68  Rhythm: normal sinus rhythm  QRS Axis: right  Intervals: normal  ST/T Wave abnormalities: normal  Conduction Disutrbances:none  Narrative Interpretation:   Old EKG Reviewed: no significant change  Laboratory Studies: Results for orders placed or performed during the hospital encounter of 01/04/20 (from the past 24 hour(s))  CBC WITH DIFFERENTIAL     Status: Abnormal   Collection Time: 01/04/20  1:00 AM  Result Value Ref Range   WBC 7.3 4.0 - 10.5 K/uL   RBC 3.97 3.87 - 5.11 MIL/uL   Hemoglobin 11.7 (L) 12.0 - 15.0 g/dL   HCT 32.2 (L) 36.0 - 46.0 %   MCV 81.1 80.0 - 100.0 fL   MCH 29.5 26.0 - 34.0 pg   MCHC 36.3 (H) 30.0 - 36.0 g/dL   RDW 14.4 11.5 - 15.5 %   Platelets 323 150 - 400 K/uL   nRBC 1.5 (H) 0.0 - 0.2 %   Neutrophils Relative % 50 %   Neutro Abs 3.7 1.7 - 7.7 K/uL   Lymphocytes Relative 34 %   Lymphs Abs 2.5 0.7 - 4.0 K/uL   Monocytes Relative 7 %   Monocytes Absolute 0.5 0.1 - 1.0 K/uL   Eosinophils Relative 8 %   Eosinophils Absolute 0.6 (H) 0.0 - 0.5 K/uL   Basophils Relative 1 %   Basophils Absolute 0.0 0.0 - 0.1 K/uL   Immature Granulocytes 0 %   Abs Immature Granulocytes 0.02 0.00 - 0.07 K/uL  Reticulocytes     Status: Abnormal   Collection Time: 01/04/20  1:00 AM  Result Value Ref Range   Retic Ct Pct 3.4 (H) 0.4 - 3.1 %   RBC. 3.87 3.87 - 5.11 MIL/uL   Retic Count, Absolute 132.7 19.0 - 186.0 K/uL   Immature Retic Fract 31.2  (H) 2.3 - 15.9 %  Comprehensive metabolic panel     Status: Abnormal   Collection Time: 01/04/20  1:00 AM  Result Value Ref Range   Sodium 139 135 - 145 mmol/L   Potassium 3.4 (L) 3.5 - 5.1 mmol/L   Chloride 109 98 - 111 mmol/L   CO2 26 22 - 32 mmol/L   Glucose, Bld 119 (H) 70 - 99 mg/dL   BUN 7 6 - 20 mg/dL   Creatinine, Ser 0.66 0.44 - 1.00 mg/dL   Calcium 8.7 (L) 8.9 - 10.3 mg/dL   Total Protein 7.1 6.5 - 8.1 g/dL   Albumin 3.9 3.5 -  5.0 g/dL   AST 16 15 - 41 U/L   ALT 13 0 - 44 U/L   Alkaline Phosphatase 48 38 - 126 U/L   Total Bilirubin 0.7 0.3 - 1.2 mg/dL   GFR calc non Af Amer >60 >60 mL/min   GFR calc Af Amer >60 >60 mL/min   Anion gap 4 (L) 5 - 15   Imaging Studies: DG Chest Portable 1 View  Result Date: 01/04/2020 CLINICAL DATA:  34 year old female with sickle cell crisis. Shortness of breath. EXAM: PORTABLE CHEST 1 VIEW COMPARISON:  Chest radiographs 09/09/2019 and earlier. FINDINGS: Portable AP semi upright view at 0059 hours. Lung volumes and mediastinal contours remain within normal limits. Allowing for portable technique the lungs are clear. No pneumothorax. Visualized tracheal air column is within normal limits. No osseous abnormality identified. IMPRESSION: Negative portable chest. Electronically Signed   By: Genevie Ann M.D.   On: 01/04/2020 01:16    ED COURSE and MDM  Nursing notes, initial and subsequent vitals signs, including pulse oximetry, reviewed and interpreted by myself.  Vitals:   01/03/20 2226 01/03/20 2245 01/04/20 0500 01/04/20 0530  BP: (!) 134/97  (!) 144/93 (!) 137/93  Pulse: 85  72 80  Resp: _0 Temp: 98 F (36.7 C)     TempSrc: Oral     SpO2: 98%  100% 100%  Weight:  67.1 kg    Height:  _1  (1.6 m)     Medications  promethazine (PHENERGAN) tablet 25 mg (25 mg Oral Given 01/04/20 0436)  HYDROmorphone (DILAUDID) injection 2 mg (2 mg Intravenous Given 01/04/20 0427)  HYDROmorphone (DILAUDID) injection 2 mg (2 mg Intravenous Given  01/04/20 0500)  HYDROmorphone (DILAUDID) injection 2 mg (2 mg Intravenous Given 01/04/20 0541)  diphenhydrAMINE (BENADRYL) injection 25 mg (25 mg Intravenous Given 01/04/20 0426)  0.45 % sodium chloride infusion ( Intravenous Stopped 01/04/20 0542)   5:57 AM Patient feeling better after IV fluids and medications.  She states she is ready to go home.  She would like a brief refill of her home oxycodone pending follow-up with her pain management physician.  She is also on extended release morphine but she was was advised that emergency physician is not permitted to prescribe extended release narcotics.   PROCEDURES  Procedures   ED DIAGNOSES     ICD-10-CM   1. Sickle cell pain crisis ([place])  D57.00        Wyley Hack, MD 01/04/20 [phone]

## 2020-01-08 ENCOUNTER — Telehealth: Payer: Self-pay | Admitting: Family Medicine

## 2020-01-08 ENCOUNTER — Other Ambulatory Visit: Payer: Medicaid Other

## 2020-01-08 NOTE — Telephone Encounter (Signed)
error 

## 2020-01-14 ENCOUNTER — Other Ambulatory Visit: Payer: Self-pay

## 2020-01-14 ENCOUNTER — Other Ambulatory Visit: Payer: Self-pay | Admitting: Family Medicine

## 2020-01-14 ENCOUNTER — Other Ambulatory Visit: Payer: Medicaid Other

## 2020-01-14 ENCOUNTER — Non-Acute Institutional Stay (HOSPITAL_COMMUNITY)
Admission: RE | Admit: 2020-01-14 | Discharge: 2020-01-14 | Disposition: A | Discharge status: Home / Self Care | Payer: Medicaid Other | Source: Ambulatory Visit | Attending: Internal Medicine | Admitting: Internal Medicine

## 2020-01-14 DIAGNOSIS — G894 Chronic pain syndrome: Secondary | ICD-10-CM

## 2020-01-14 DIAGNOSIS — F1490 Cocaine use, unspecified, uncomplicated: Secondary | ICD-10-CM

## 2020-01-14 LAB — RAPID URINE DRUG SCREEN, HOSP PERFORMED
Amphetamines: NOT DETECTED
Barbiturates: NOT DETECTED
Benzodiazepines: NOT DETECTED
Cocaine: NOT DETECTED
Opiates: NOT DETECTED
Tetrahydrocannabinol: NOT DETECTED

## 2020-01-14 NOTE — Progress Notes (Signed)
Orders Placed This Encounter  Procedures  . [id number] 11+Oxyco+Alc+Crt-Bund    Standing Status:   Future    Standing Expiration Date:   01/13/2021    Nolon Nations  APRN, MSN, FNP-C Patient Care [facility] [address] [place], [place] [address] [phone]

## 2020-01-15 ENCOUNTER — Telehealth: Payer: Self-pay | Admitting: Family Medicine

## 2020-01-15 ENCOUNTER — Other Ambulatory Visit: Payer: Self-pay | Admitting: Family Medicine

## 2020-01-15 DIAGNOSIS — G894 Chronic pain syndrome: Secondary | ICD-10-CM

## 2020-01-15 LAB — DRUG SCREEN 764883 11+OXYCO+ALC+CRT-BUND
Amphetamines, Urine: NEGATIVE ng/mL
BENZODIAZ UR QL: NEGATIVE ng/mL
Barbiturate: NEGATIVE ng/mL
Cannabinoid Quant, Ur: NEGATIVE ng/mL
Cocaine (Metabolite): NEGATIVE ng/mL
Creatinine: 337.1 mg/dL — ABNORMAL HIGH (ref 20.0–300.0)
Ethanol: NEGATIVE %
Meperidine: NEGATIVE ng/mL
Methadone Screen, Urine: NEGATIVE ng/mL
OPIATE SCREEN URINE: NEGATIVE ng/mL
Oxycodone/Oxymorphone, Urine: NEGATIVE ng/mL
Phencyclidine: NEGATIVE ng/mL
Propoxyphene: NEGATIVE ng/mL
Tramadol: NEGATIVE ng/mL
pH, Urine: 5.5 (ref 4.5–8.9)

## 2020-01-15 MED ORDER — OXYCODONE HCL 15 MG PO TABS: 15.0000 mg | ORAL_TABLET | ORAL | 0 refills | Status: DC | PRN

## 2020-01-15 NOTE — Progress Notes (Signed)
Reviewed PDMP substance reporting system prior to prescribing opiate medications. No inconsistencies noted.  °Meds ordered this encounter  °Medications  ° oxyCODONE (ROXICODONE) 15 MG immediate release tablet  °  Sig: Take 1 tablet (15 mg total) by mouth every 4 (four) hours as needed for pain.  °  Dispense:  60 tablet  °  Refill:  0  °  Order Specific Question:   Supervising Provider  °  Answer:   JEGEDE, [name] [[id number]]  ° Lachina Moore Hollis  APRN, MSN, FNP-C °[facility] °[facility] °[address]  °[place], [place] [address] [phone] ° °

## 2020-01-15 NOTE — Telephone Encounter (Signed)
Please advise on patient's medication refill.

## 2020-01-22 ENCOUNTER — Encounter: Payer: Medicaid Other | Admitting: Family Medicine

## 2020-01-23 ENCOUNTER — Encounter: Payer: Self-pay | Admitting: Nurse Practitioner

## 2020-01-23 ENCOUNTER — Other Ambulatory Visit: Payer: Self-pay

## 2020-01-23 ENCOUNTER — Ambulatory Visit (INDEPENDENT_AMBULATORY_CARE_PROVIDER_SITE_OTHER): Payer: Medicaid Other | Admitting: Nurse Practitioner

## 2020-01-23 VITALS — BP 107/71 | HR 80 | Temp 98.6°F | Resp 16 | Ht 62.0 in | Wt 148.0 lb

## 2020-01-23 DIAGNOSIS — M5441 Lumbago with sciatica, right side: Secondary | ICD-10-CM | POA: Diagnosis not present

## 2020-01-23 DIAGNOSIS — J301 Allergic rhinitis due to pollen: Secondary | ICD-10-CM

## 2020-01-23 DIAGNOSIS — D5720 Sickle-cell/Hb-C disease without crisis: Secondary | ICD-10-CM | POA: Diagnosis not present

## 2020-01-23 DIAGNOSIS — R202 Paresthesia of skin: Secondary | ICD-10-CM

## 2020-01-23 DIAGNOSIS — M546 Pain in thoracic spine: Secondary | ICD-10-CM | POA: Diagnosis not present

## 2020-01-23 DIAGNOSIS — R200 Anesthesia of skin: Secondary | ICD-10-CM

## 2020-01-23 DIAGNOSIS — G8929 Other chronic pain: Secondary | ICD-10-CM

## 2020-01-23 LAB — POCT URINALYSIS DIPSTICK
Bilirubin, UA: NEGATIVE
Blood, UA: NEGATIVE
Glucose, UA: NEGATIVE
Leukocytes, UA: NEGATIVE
Nitrite, UA: NEGATIVE
Protein, UA: NEGATIVE
Spec Grav, UA: 1.025 (ref 1.010–1.025)
Urobilinogen, UA: 0.2 E.U./dL
pH, UA: 6 (ref 5.0–8.0)

## 2020-01-23 MED ORDER — MORPHINE SULFATE ER 30 MG PO TBCR: 30.0000 mg | EXTENDED_RELEASE_TABLET | Freq: Two times a day (BID) | ORAL | 0 refills | Status: DC

## 2020-01-23 MED ORDER — MONTELUKAST SODIUM 10 MG PO TABS: 10.0000 mg | ORAL_TABLET | Freq: Every day | ORAL | 3 refills | Status: DC

## 2020-01-23 MED ORDER — TIZANIDINE HCL 2 MG PO TABS: 2.0000 mg | ORAL_TABLET | Freq: Three times a day (TID) | ORAL | 1 refills | Status: DC | PRN

## 2020-01-23 MED ORDER — LIDOCAINE 5 % EX PTCH: 1.0000 | MEDICATED_PATCH | CUTANEOUS | 1 refills | Status: DC

## 2020-01-23 NOTE — Progress Notes (Signed)
Established Patient Office Visit  Subjective:  Patient ID: Hannah Hawkins, female    DOB: 10-23-1986  Age: 34 y.o. MRN: [id number]  CC:  Chief Complaint  Patient presents with  . Sickle Cell Anemia    HPI Hannah Hawkins presents for follow-up.  She has a history of sickle cell anemia, cocaine abuse and allergic rhinitis.  She is in today for follow-up.  She has multiple complaints.  She admits that she continues to have increased middle and lower back pain.  The pain does radiate into her right leg.  She has some numbness and tingling.  She admits that she also has numbness and tingling in her left arm.  She did this is related to a disc in her neck.  She is currently on gabapentin 100 mg 3 times daily.  She has noticed that she is swelling.  She is currently on oxycodone 15 mg every 4 as needed.  She admits that she was on morphine 30 mg twice daily to help with her pain.  She would like to restart this.    She has been treated for allergic rhinitis.  She admits this is related to her cocaine abuse.  She is currently on Xyzal however does not feel like this is effective.  She is having sinus pressure, nasal drainage and congestion.  She denies any fever, chills, tooth pain, headache or dizziness.  She has some shortness of breath. She denies chest pain.  She is concerned that that she is taking for muscle spasms is no longer effective.  She has been on cyclobenzaprine in the past.  She would like to try something else.  Past Medical History:  Diagnosis Date  . Sickle cell anemia (HCC)     Past Surgical History:  Procedure Laterality Date  . EYE SURGERY  03/09/2017   left eye  . EYE SURGERY Right 09/2019   right hemerage     Family History  Problem Relation Age of Onset  . Diabetes Father     Social History   Socioeconomic History  . Marital status: Single    Spouse name: Not on file  . Number of children: Not on file  . Years of education: Not on file  . Highest  education level: Not on file  Occupational History  . Not on file  Tobacco Use  . Smoking status: Former Smoker    Types: Cigarettes  . Smokeless tobacco: Never Used  Substance and Sexual Activity  . Alcohol use: Not Currently  . Drug use: No  . Sexual activity: Yes  Other Topics Concern  . Not on file  Social History Narrative  . Not on file   Social Determinants of Health   Financial Resource Strain:   . Difficulty of Paying Living Expenses: Not on file  Food Insecurity:   . Worried About Charity fundraiser in the Last Year: Not on file  . Ran Out of Food in the Last Year: Not on file  Transportation Needs:   . Lack of Transportation (Medical): Not on file  . Lack of Transportation (Non-Medical): Not on file  Physical Activity:   . Days of Exercise per Week: Not on file  . Minutes of Exercise per Session: Not on file  Stress:   . Feeling of Stress : Not on file  Social Connections:   . Frequency of Communication with Friends and Family: Not on file  . Frequency of Social Gatherings with Friends and Family: Not on file  .  Attends Religious Services: Not on file  . Active Member of Clubs or Organizations: Not on file  . Attends Archivist Meetings: Not on file  . Marital Status: Not on file  Intimate Partner Violence:   . Fear of Current or Ex-Partner: Not on file  . Emotionally Abused: Not on file  . Physically Abused: Not on file  . Sexually Abused: Not on file    Outpatient Medications Prior to Visit  Medication Sig Dispense Refill  . hydroxyurea (HYDREA) 500 MG capsule TAKE 2 CAPSULES(1000 MG) BY MOUTH DAILY 60 capsule 2  . ibuprofen (ADVIL) 800 MG tablet TAKE 1 TABLET(800 MG) BY MOUTH EVERY 8 HOURS AS NEEDED FOR MODERATE PAIN (Patient taking differently: Take 800 mg by mouth every 8 (eight) hours as needed for moderate pain. ) 30 tablet 5  . NARCAN 4 MG/0.1ML LIQD nasal spray kit Place 4 mg into the nose once as needed (overdose).   0  . oxyCODONE  (ROXICODONE) 15 MG immediate release tablet Take 1 tablet (15 mg total) by mouth every 4 (four) hours as needed for pain. 60 tablet 0  . pantoprazole (PROTONIX) 40 MG tablet Take 1 tablet (40 mg total) by mouth daily. 30 tablet 3  . gabapentin (NEURONTIN) 100 MG capsule Take 1 capsule (100 mg total) by mouth 3 (three) times daily. 90 capsule 0  . levocetirizine (XYZAL) 5 MG tablet TAKE 1 TABLET(5 MG) BY MOUTH EVERY EVENING (Patient taking differently: Take 5 mg by mouth every evening. ) 90 tablet 1  . ergocalciferol (VITAMIN D2) 1.25 MG (50000 UT) capsule Take 1 capsule (50,000 Units total) by mouth once a week. (Patient not taking: Reported on 01/23/2020) 12 capsule 3  . promethazine (PHENERGAN) 12.5 MG tablet TAKE 1 TABLET(12.5 MG) BY MOUTH EVERY 4 HOURS AS NEEDED FOR NAUSEA OR VOMITING (Patient not taking: No sig reported) 30 tablet 0  . baclofen (LIORESAL) 10 MG tablet Take 0.5 tablets (5 mg total) by mouth 3 (three) times daily. (Patient not taking: Reported on 01/23/2020) 90 each 0   No facility-administered medications prior to visit.    Allergies  Allergen Reactions  . Penicillins Swelling and Rash    Has patient had a PCN reaction causing immediate rash, facial/tongue/throat swelling, SOB or lightheadedness with hypotension: Yes Has patient had a PCN reaction causing severe rash involving mucus membranes or skin necrosis: Yes Has patient had a PCN reaction that required hospitalization No Has patient had a PCN reaction occurring within the last 10 years: no If all of the above answers are "NO", then may proceed with Cephalosporin use.     ROS Review of Systems  Constitutional: Negative.   HENT: Positive for congestion, postnasal drip and sinus pressure.   Eyes: Negative.   Respiratory: Positive for shortness of breath.   Cardiovascular: Negative.   Endocrine: Negative.   Musculoskeletal: Positive for back pain.  Allergic/Immunologic: Negative.   Hematological: Negative.        Objective:    Physical Exam  Constitutional: She is oriented to person, place, and time. She appears well-developed and well-nourished.  HENT:  Head: Normocephalic.  Facial tenderness  Cardiovascular: Normal rate and regular rhythm.  Pulmonary/Chest: Effort normal and breath sounds normal.  Abdominal: Soft. Bowel sounds are normal.  Musculoskeletal:        General: Tenderness and edema present.     Cervical back: Normal range of motion and neck supple.     Comments: Left upper back  Neurological: She is alert  and oriented to person, place, and time.  Skin: Skin is warm and dry.  Small abrasion to second digit Some clubbing to fingers  Psychiatric: She has a normal mood and affect. Her behavior is normal. Judgment and thought content normal.    BP 107/71 (BP Location: Left Arm, Patient Position: Sitting, Cuff Size: Normal)   Pulse 80   Temp 98.6 F (37 C) (Oral)   Resp 16   Ht '5\' 2"'  (1.575 m)   Wt 148 lb (67.1 kg)   LMP 01/21/2020 Comment: spotting   SpO2 100%   BMI 27.07 kg/m  Wt Readings from Last 3 Encounters:  01/23/20 148 lb (67.1 kg)  01/03/20 148 lb (67.1 kg)  11/27/19 148 lb (67.1 kg)     There are no preventive care reminders to display for this patient.  There are no preventive care reminders to display for this patient.  Lab Results  Component Value Date   TSH 0.56 09/30/2017   Lab Results  Component Value Date   WBC 7.3 01/04/2020   HGB 11.7 (L) 01/04/2020   HCT 32.2 (L) 01/04/2020   MCV 81.1 01/04/2020   PLT 323 01/04/2020   Lab Results  Component Value Date   NA 139 01/04/2020   K 3.4 (L) 01/04/2020   CO2 26 01/04/2020   GLUCOSE 119 (H) 01/04/2020   BUN 7 01/04/2020   CREATININE 0.66 01/04/2020   BILITOT 0.7 01/04/2020   ALKPHOS 48 01/04/2020   AST 16 01/04/2020   ALT 13 01/04/2020   PROT 7.1 01/04/2020   ALBUMIN 3.9 01/04/2020   CALCIUM 8.7 (L) 01/04/2020   ANIONGAP 4 (L) 01/04/2020   No results found for: CHOL No results found  for: HDL No results found for: LDLCALC No results found for: TRIG No results found for: CHOLHDL No results found for: HGBA1C    Assessment & Plan:   Problem List Items Addressed This Visit      High   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)   Relevant Orders   Urinalysis Dipstick (Completed)    Other Visit Diagnoses    Non-seasonal allergic rhinitis due to pollen       Xyzal and discontinued Singulair started   Chronic right-sided thoracic back pain       Baclofen changed to tizanidine   Relevant Medications   morphine (MS CONTIN) 30 MG 12 hr tablet   tiZANidine (ZANAFLEX) 2 MG tablet   gabapentin (NEURONTIN) 100 MG capsule   Other Relevant Orders   DG Thoracic Spine 2 View   Chronic right-sided low back pain with right-sided sciatica       X-ray evaluation pending   Relevant Medications   morphine (MS CONTIN) 30 MG 12 hr tablet   tiZANidine (ZANAFLEX) 2 MG tablet   gabapentin (NEURONTIN) 100 MG capsule   Other Relevant Orders   DG Lumbar Spine Complete   Numbness and tingling of left upper extremity       Gabapentin 100 mg decreased to twice daily we will continue to monitor swelling      Meds ordered this encounter  Medications  . morphine (MS CONTIN) 30 MG 12 hr tablet    Sig: Take 1 tablet (30 mg total) by mouth every 12 (twelve) hours. Must last 30 days.    Dispense:  60 tablet    Refill:  0    [place] STOP ACT - Not applicable. Fill one day early if pharmacy is closed on scheduled refill date. Do not fill  until: 01/23/20 . Must last 30 days. To last until: 02/22/20.    Order Specific Question:   Supervising Provider    Answer:   Tresa Garter [id number]  . montelukast (SINGULAIR) 10 MG tablet    Sig: Take 1 tablet (10 mg total) by mouth at bedtime.    Dispense:  30 tablet    Refill:  3    Order Specific Question:   Supervising Provider    Answer:   Tresa Garter [id number]  . lidocaine (LIDODERM) 5 %    Sig: Place 1 patch onto the skin  daily. Remove & Discard patch within 12 hours or as directed by MD    Dispense:  30 patch    Refill:  1    Order Specific Question:   Supervising Provider    Answer:   Tresa Garter [id number]  . tiZANidine (ZANAFLEX) 2 MG tablet    Sig: Take 1 tablet (2 mg total) by mouth every 8 (eight) hours as needed for muscle spasms.    Dispense:  90 tablet    Refill:  1    Do not place this medication, or any other prescription from our practice, on "Automatic Refill". Patient may have prescription filled one day early if pharmacy is closed on scheduled refill date.    Order Specific Question:   Supervising Provider    Answer:   Tresa Garter [id number]  . gabapentin (NEURONTIN) 100 MG capsule    Sig: Take 1 capsule (100 mg total) by mouth 2 (two) times daily.    Dispense:  90 capsule    Refill:  0    Order Specific Question:   Supervising Provider    Answer:   Tresa Garter [[id number]]    Follow-up: Return in about 4 weeks (around 02/20/2020).    Vevelyn Francois, NP

## 2020-01-23 NOTE — Patient Instructions (Signed)
Sickle Cell Anemia, Adult  Sickle cell anemia is a condition where your red blood cells are shaped like sickles. Red blood cells carry oxygen through the body. Sickle-shaped cells do not live as long as normal red blood cells. They also clump together and block blood from flowing through the blood vessels. This prevents the body from getting enough oxygen. Sickle cell anemia causes organ damage and pain. It also increases the risk of infection. Follow these instructions at home: Medicines  Take over-the-counter and prescription medicines only as told by your doctor.  If you were prescribed an antibiotic medicine, take it as told by your doctor. Do not stop taking the antibiotic even if you start to feel better.  If you develop a fever, do not take medicines to lower the fever right away. Tell your doctor about the fever. Managing pain, stiffness, and swelling  Try these methods to help with pain: ? Use a heating pad. ? Take a warm bath. ? Distract yourself, such as by watching TV. Eating and drinking  Drink enough fluid to keep your pee (urine) clear or pale yellow. Drink more in hot weather and during exercise.  Limit or avoid alcohol.  Eat a healthy diet. Eat plenty of fruits, vegetables, whole grains, and lean protein.  Take vitamins and supplements as told by your doctor. Traveling  When traveling, keep these with you: ? Your medical information. ? The names of your doctors. ? Your medicines.  If you need to take an airplane, talk to your doctor first. Activity  Rest often.  Avoid exercises that make your heart beat much faster, such as jogging. General instructions  Do not use products that have nicotine or tobacco, such as cigarettes and e-cigarettes. If you need help quitting, ask your doctor.  Consider wearing a medical alert bracelet.  Avoid being in high places (high altitudes), such as mountains.  Avoid very hot or cold temperatures.  Avoid places where the  temperature changes a lot.  Keep all follow-up visits as told by your doctor. This is important. Contact a doctor if:  A joint hurts.  Your feet or hands hurt or swell.  You feel tired (fatigued). Get help right away if:  You have symptoms of infection. These include: ? Fever. ? Chills. ? Being very tired. ? Irritability. ? Poor eating. ? Throwing up (vomiting).  You feel dizzy or faint.  You have new stomach pain, especially on the left side.  You have a an erection (priapism) that lasts more than 4 hours.  You have numbness in your arms or legs.  You have a hard time moving your arms or legs.  You have trouble talking.  You have pain that does not go away when you take medicine.  You are short of breath.  You are breathing fast.  You have a long-term cough.  You have pain in your chest.  You have a bad headache.  You have a stiff neck.  Your stomach looks bloated even though you did not eat much.  Your skin is pale.  You suddenly cannot see well. Summary  Sickle cell anemia is a condition where your red blood cells are shaped like sickles.  Follow your doctor's advice on ways to manage pain, food to eat, activities to do, and steps to take for safe travel.  Get medical help right away if you have any signs of infection, such as a fever. This information is not intended to replace advice given to you by   your health care provider. Make sure you discuss any questions you have with your health care provider. Document Revised: 03/30/2019 Document Reviewed: 01/11/2017 Elsevier Patient Education  2020 [employer] Inc[employer]  

## 2020-01-24 ENCOUNTER — Telehealth: Payer: Self-pay | Admitting: Nurse Practitioner

## 2020-01-24 MED ORDER — GABAPENTIN 100 MG PO CAPS: 100.0000 mg | ORAL_CAPSULE | Freq: Two times a day (BID) | ORAL | 0 refills | Status: DC

## 2020-01-24 NOTE — Telephone Encounter (Signed)
Please call pt back. Pt called about pharmacy pre-authorization for lidocaine

## 2020-01-28 ENCOUNTER — Other Ambulatory Visit: Payer: Self-pay

## 2020-01-28 ENCOUNTER — Ambulatory Visit (INDEPENDENT_AMBULATORY_CARE_PROVIDER_SITE_OTHER): Payer: Medicaid Other | Admitting: Nurse Practitioner

## 2020-01-28 VITALS — BP 119/68 | HR 73 | Temp 98.9°F | Resp 16 | Ht 62.0 in | Wt 148.0 lb

## 2020-01-28 DIAGNOSIS — Z202 Contact with and (suspected) exposure to infections with a predominantly sexual mode of transmission: Secondary | ICD-10-CM

## 2020-01-28 NOTE — Progress Notes (Signed)
Acute Office Visit  Subjective:    Patient ID: Hannah Hawkins, female    DOB: 1986-04-17, 34 y.o.   MRN: [id number]  Chief Complaint  Patient presents with  . Mouth Lesions    inside mouth   . Vaginal Pain    sore on labia   . Exposure to STD    wants to be checked for STDs     HPI Patient is in today for STD exposure.  She admits that she developed oral lesions on the side and in her mouth.  She also has a vaginal lesion. Denies any pelvic pain or tenderness, amenorrhea irregular bleeding or prolonged heavy bleeding.  Denies vaginal discharge or dysuria.    Past Medical History:  Diagnosis Date  . Sickle cell anemia (HCC)     Past Surgical History:  Procedure Laterality Date  . EYE SURGERY  03/09/2017   left eye  . EYE SURGERY Right 09/2019   right hemerage     Family History  Problem Relation Age of Onset  . Diabetes Father     Social History   Socioeconomic History  . Marital status: Single    Spouse name: Not on file  . Number of children: Not on file  . Years of education: Not on file  . Highest education level: Not on file  Occupational History  . Not on file  Tobacco Use  . Smoking status: Former Smoker    Types: Cigarettes  . Smokeless tobacco: Never Used  Substance and Sexual Activity  . Alcohol use: Not Currently  . Drug use: No  . Sexual activity: Yes  Other Topics Concern  . Not on file  Social History Narrative  . Not on file   Social Determinants of Health   Financial Resource Strain:   . Difficulty of Paying Living Expenses: Not on file  Food Insecurity:   . Worried About Charity fundraiser in the Last Year: Not on file  . Ran Out of Food in the Last Year: Not on file  Transportation Needs:   . Lack of Transportation (Medical): Not on file  . Lack of Transportation (Non-Medical): Not on file  Physical Activity:   . Days of Exercise per Week: Not on file  . Minutes of Exercise per Session: Not on file  Stress:   . Feeling  of Stress : Not on file  Social Connections:   . Frequency of Communication with Friends and Family: Not on file  . Frequency of Social Gatherings with Friends and Family: Not on file  . Attends Religious Services: Not on file  . Active Member of Clubs or Organizations: Not on file  . Attends Archivist Meetings: Not on file  . Marital Status: Not on file  Intimate Partner Violence:   . Fear of Current or Ex-Partner: Not on file  . Emotionally Abused: Not on file  . Physically Abused: Not on file  . Sexually Abused: Not on file    Outpatient Medications Prior to Visit  Medication Sig Dispense Refill  . gabapentin (NEURONTIN) 100 MG capsule Take 1 capsule (100 mg total) by mouth 2 (two) times daily. 90 capsule 0  . hydroxyurea (HYDREA) 500 MG capsule TAKE 2 CAPSULES(1000 MG) BY MOUTH DAILY 60 capsule 2  . ibuprofen (ADVIL) 800 MG tablet TAKE 1 TABLET(800 MG) BY MOUTH EVERY 8 HOURS AS NEEDED FOR MODERATE PAIN (Patient taking differently: Take 800 mg by mouth every 8 (eight) hours as needed for moderate pain. )  30 tablet 5  . lidocaine (LIDODERM) 5 % Place 1 patch onto the skin daily. Remove & Discard patch within 12 hours or as directed by MD 30 patch 1  . montelukast (SINGULAIR) 10 MG tablet Take 1 tablet (10 mg total) by mouth at bedtime. 30 tablet 3  . morphine (MS CONTIN) 30 MG 12 hr tablet Take 1 tablet (30 mg total) by mouth every 12 (twelve) hours. Must last 30 days. 60 tablet 0  . NARCAN 4 MG/0.1ML LIQD nasal spray kit Place 4 mg into the nose once as needed (overdose).   0  . oxyCODONE (ROXICODONE) 15 MG immediate release tablet Take 1 tablet (15 mg total) by mouth every 4 (four) hours as needed for pain. 60 tablet 0  . pantoprazole (PROTONIX) 40 MG tablet Take 1 tablet (40 mg total) by mouth daily. 30 tablet 3  . promethazine (PHENERGAN) 12.5 MG tablet TAKE 1 TABLET(12.5 MG) BY MOUTH EVERY 4 HOURS AS NEEDED FOR NAUSEA OR VOMITING 30 tablet 0  . tiZANidine (ZANAFLEX) 2 MG  tablet Take 1 tablet (2 mg total) by mouth every 8 (eight) hours as needed for muscle spasms. 90 tablet 1  . ergocalciferol (VITAMIN D2) 1.25 MG (50000 UT) capsule Take 1 capsule (50,000 Units total) by mouth once a week. (Patient not taking: Reported on 01/23/2020) 12 capsule 3   No facility-administered medications prior to visit.    Allergies  Allergen Reactions  . Penicillins Swelling and Rash    Has patient had a PCN reaction causing immediate rash, facial/tongue/throat swelling, SOB or lightheadedness with hypotension: Yes Has patient had a PCN reaction causing severe rash involving mucus membranes or skin necrosis: Yes Has patient had a PCN reaction that required hospitalization No Has patient had a PCN reaction occurring within the last 10 years: no If all of the above answers are "NO", then may proceed with Cephalosporin use.     Review of Systems     Objective:    Physical Exam Constitutional:      Appearance: Normal appearance.  HENT:     Head: Normocephalic.     Nose: Nose normal.     Mouth/Throat:     Mouth: Mucous membranes are moist.     Comments: Right outer lip lesion Left inner mucosal lesion Genitourinary:    Comments: Deferred Skin:    General: Skin is warm and dry.  Neurological:     Mental Status: She is alert and oriented to person, place, and time.  Psychiatric:        Mood and Affect: Mood normal.        Behavior: Behavior normal.        Thought Content: Thought content normal.        Judgment: Judgment normal.     BP 119/68 (BP Location: Left Arm, Patient Position: Sitting, Cuff Size: Normal)   Pulse 73   Temp 98.9 F (37.2 C) (Oral)   Resp 16   Ht _0  (1.575 m)   Wt 148 lb (67.1 kg)   LMP 01/21/2020 Comment: spotting   SpO2 100%   BMI 27.07 kg/m  Wt Readings from Last 3 Encounters:  01/28/20 148 lb (67.1 kg)  01/23/20 148 lb (67.1 kg)  01/03/20 148 lb (67.1 kg)    There are no preventive care reminders to display for this  patient.  There are no preventive care reminders to display for this patient.   Lab Results  Component Value Date   TSH 0.56 09/30/2017  Lab Results  Component Value Date   WBC 7.3 01/04/2020   HGB 11.7 (L) 01/04/2020   HCT 32.2 (L) 01/04/2020   MCV 81.1 01/04/2020   PLT 323 01/04/2020   Lab Results  Component Value Date   NA 139 01/04/2020   K 3.4 (L) 01/04/2020   CO2 26 01/04/2020   GLUCOSE 119 (H) 01/04/2020   BUN 7 01/04/2020   CREATININE 0.66 01/04/2020   BILITOT 0.7 01/04/2020   ALKPHOS 48 01/04/2020   AST 16 01/04/2020   ALT 13 01/04/2020   PROT 7.1 01/04/2020   ALBUMIN 3.9 01/04/2020   CALCIUM 8.7 (L) 01/04/2020   ANIONGAP 4 (L) 01/04/2020   No results found for: CHOL No results found for: HDL No results found for: LDLCALC No results found for: TRIG No results found for: CHOLHDL No results found for: HGBA1C     Assessment & Plan:   Problem List Items Addressed This Visit    None    Visit Diagnoses    STD exposure    -  Primary   Relevant Orders   STD Screen (8)   GC/Chlamydia Probe Amp   GC/CT Probe, Amp (Throat)   Herpes simplex virus culture   GC/Chlamydia Probe Amp([facility])   HIV antibody (with reflex)       No orders of the defined types were placed in this encounter.    Vevelyn Francois, NP

## 2020-01-29 LAB — STD SCREEN (8)
HIV Screen 4th Generation wRfx: NONREACTIVE
HSV 1 Glycoprotein G Ab, IgG: 15.3 index — ABNORMAL HIGH (ref 0.00–0.90)
HSV 2 IgG, Type Spec: 10.4 index — ABNORMAL HIGH (ref 0.00–0.90)
Hep A IgM: NEGATIVE
Hep B C IgM: NEGATIVE
Hep C Virus Ab: <0.1 s/co ratio (ref 0.0–0.9)
Hepatitis B Surface Ag: NEGATIVE
RPR Ser Ql: NONREACTIVE

## 2020-01-29 LAB — GC/CHLAMYDIA PROBE AMP
Chlamydia trachomatis, NAA: NEGATIVE
Neisseria Gonorrhoeae by PCR: NEGATIVE

## 2020-01-30 ENCOUNTER — Other Ambulatory Visit: Payer: Self-pay

## 2020-01-30 ENCOUNTER — Telehealth: Payer: Self-pay | Admitting: Nurse Practitioner

## 2020-01-30 ENCOUNTER — Other Ambulatory Visit: Payer: Self-pay | Admitting: Nurse Practitioner

## 2020-01-30 DIAGNOSIS — G894 Chronic pain syndrome: Secondary | ICD-10-CM

## 2020-01-30 LAB — HERPES SIMPLEX VIRUS CULTURE

## 2020-01-30 MED ORDER — OXYCODONE HCL 15 MG PO TABS: 15.0000 mg | ORAL_TABLET | ORAL | 0 refills | Status: DC | PRN

## 2020-01-30 NOTE — Telephone Encounter (Signed)
Called, no answer. Left a message that this has been sent to pharmacy. Thanks!

## 2020-01-30 NOTE — Telephone Encounter (Signed)
Refill request for oxycodone. Please advise.  

## 2020-02-04 ENCOUNTER — Telehealth (HOSPITAL_COMMUNITY): Payer: Self-pay | Admitting: *Deleted

## 2020-02-04 NOTE — Telephone Encounter (Signed)
Patient called requesting to come to the day hospital for sickle cell pain. Patient reports back pain. The day hospital is unable to accept patient's after 1:00 pm due to the fact that there would not be enough time for the patient to get adequate treatment. Patient advised to call back to the day hospital as soon as we open tomorrow morning, at 8:00 am. Patient advised and expresses an understanding.

## 2020-02-05 ENCOUNTER — Telehealth (HOSPITAL_COMMUNITY): Payer: Self-pay | Admitting: *Deleted

## 2020-02-05 NOTE — Telephone Encounter (Signed)
Patient called requesting to come to the day hospital for sickle cell pain. Patient informed that the day hospital was at capacity and unable to accept any more patients today.  Patient became upset and wanted to speak with someone else. Patient transferred to [facility], RN who further explained that all of the day hospital rooms were occupied and that we were not able to accept any more patients.  Patient advised to call as early as possible, preferably at 8:00 am, to increase chances of securing a bed.

## 2020-02-08 ENCOUNTER — Non-Acute Institutional Stay (HOSPITAL_COMMUNITY)
Admission: AD | Admit: 2020-02-08 | Discharge: 2020-02-08 | Disposition: A | Discharge status: Home / Self Care | Payer: Medicaid Other | Source: Ambulatory Visit | Attending: Internal Medicine | Admitting: Internal Medicine

## 2020-02-08 ENCOUNTER — Encounter (HOSPITAL_COMMUNITY): Payer: Self-pay | Admitting: Internal Medicine

## 2020-02-08 ENCOUNTER — Telehealth (HOSPITAL_COMMUNITY): Payer: Self-pay | Admitting: General Practice

## 2020-02-08 DIAGNOSIS — Z79899 Other long term (current) drug therapy: Secondary | ICD-10-CM | POA: Insufficient documentation

## 2020-02-08 DIAGNOSIS — D638 Anemia in other chronic diseases classified elsewhere: Secondary | ICD-10-CM | POA: Diagnosis not present

## 2020-02-08 DIAGNOSIS — G894 Chronic pain syndrome: Secondary | ICD-10-CM | POA: Insufficient documentation

## 2020-02-08 DIAGNOSIS — F1120 Opioid dependence, uncomplicated: Secondary | ICD-10-CM | POA: Insufficient documentation

## 2020-02-08 DIAGNOSIS — D5700 Hb-SS disease with crisis, unspecified: Secondary | ICD-10-CM | POA: Diagnosis not present

## 2020-02-08 DIAGNOSIS — Z87891 Personal history of nicotine dependence: Secondary | ICD-10-CM | POA: Insufficient documentation

## 2020-02-08 LAB — CBC WITH DIFFERENTIAL/PLATELET
Abs Immature Granulocytes: 0 10*3/uL (ref 0.00–0.07)
Basophils Absolute: 0.1 10*3/uL (ref 0.0–0.1)
Basophils Relative: 1 %
Eosinophils Absolute: 0.2 10*3/uL (ref 0.0–0.5)
Eosinophils Relative: 3 %
HCT: 30.2 % — ABNORMAL LOW (ref 36.0–46.0)
Hemoglobin: 11 g/dL — ABNORMAL LOW (ref 12.0–15.0)
Immature Granulocytes: 0 %
Lymphocytes Relative: 50 %
Lymphs Abs: 3 10*3/uL (ref 0.7–4.0)
MCH: 28.9 pg (ref 26.0–34.0)
MCHC: 36.4 g/dL — ABNORMAL HIGH (ref 30.0–36.0)
MCV: 79.3 fL — ABNORMAL LOW (ref 80.0–100.0)
Monocytes Absolute: 0.5 10*3/uL (ref 0.1–1.0)
Monocytes Relative: 8 %
Neutro Abs: 2.3 10*3/uL (ref 1.7–7.7)
Neutrophils Relative %: 38 %
Platelets: 295 10*3/uL (ref 150–400)
RBC: 3.81 MIL/uL — ABNORMAL LOW (ref 3.87–5.11)
RDW: 13.7 % (ref 11.5–15.5)
WBC: 6 10*3/uL (ref 4.0–10.5)
nRBC: 1 % — ABNORMAL HIGH (ref 0.0–0.2)

## 2020-02-08 LAB — COMPREHENSIVE METABOLIC PANEL
ALT: 20 U/L (ref 0–44)
AST: 47 U/L — ABNORMAL HIGH (ref 15–41)
Albumin: 3.9 g/dL (ref 3.5–5.0)
Alkaline Phosphatase: 48 U/L (ref 38–126)
Anion gap: 7 (ref 5–15)
BUN: 9 mg/dL (ref 6–20)
CO2: 24 mmol/L (ref 22–32)
Calcium: 8.9 mg/dL (ref 8.9–10.3)
Chloride: 105 mmol/L (ref 98–111)
Creatinine, Ser: 0.83 mg/dL (ref 0.44–1.00)
GFR calc Af Amer: >60 mL/min (ref >60)
GFR calc non Af Amer: >60 mL/min (ref >60)
Glucose, Bld: 104 mg/dL — ABNORMAL HIGH (ref 70–99)
Potassium: 4.5 mmol/L (ref 3.5–5.1)
Sodium: 136 mmol/L (ref 135–145)
Total Bilirubin: 0.4 mg/dL (ref 0.3–1.2)
Total Protein: 7.2 g/dL (ref 6.5–8.1)

## 2020-02-08 LAB — URINALYSIS, COMPLETE (UACMP) WITH MICROSCOPIC
Bilirubin Urine: NEGATIVE
Glucose, UA: NEGATIVE mg/dL
Hgb urine dipstick: NEGATIVE
Ketones, ur: NEGATIVE mg/dL
Leukocytes,Ua: NEGATIVE
Nitrite: NEGATIVE
Protein, ur: NEGATIVE mg/dL
Specific Gravity, Urine: 1.013 (ref 1.005–1.030)
pH: 6 (ref 5.0–8.0)

## 2020-02-08 LAB — RETICULOCYTES
Immature Retic Fract: 26.5 % — ABNORMAL HIGH (ref 2.3–15.9)
RBC.: 3.79 MIL/uL — ABNORMAL LOW (ref 3.87–5.11)
Retic Count, Absolute: 86 10*3/uL (ref 19.0–186.0)
Retic Ct Pct: 2.3 % (ref 0.4–3.1)

## 2020-02-08 LAB — RAPID URINE DRUG SCREEN, HOSP PERFORMED
Amphetamines: NOT DETECTED
Barbiturates: NOT DETECTED
Benzodiazepines: NOT DETECTED
Cocaine: NOT DETECTED
Opiates: POSITIVE — AB
Tetrahydrocannabinol: NOT DETECTED

## 2020-02-08 LAB — PREGNANCY, URINE: Preg Test, Ur: NEGATIVE

## 2020-02-08 MED ORDER — SODIUM CHLORIDE 0.9% FLUSH: 9.0000 mL | INTRAVENOUS | Status: DC | PRN

## 2020-02-08 MED ORDER — HYDROMORPHONE 1 MG/ML IV SOLN
INTRAVENOUS | Status: DC
  Administered 2020-02-08: 30 mg via INTRAVENOUS
  Administered 2020-02-08: 6.5 mg via INTRAVENOUS
  Filled 2020-02-08: qty 30

## 2020-02-08 MED ORDER — ACETAMINOPHEN 500 MG PO TABS
1000.0000 mg | ORAL_TABLET | Freq: Once | ORAL | Status: AC
  Administered 2020-02-08: 1000 mg via ORAL
  Filled 2020-02-08: qty 2

## 2020-02-08 MED ORDER — NALOXONE HCL 0.4 MG/ML IJ SOLN: 0.4000 mg | INTRAMUSCULAR | Status: DC | PRN

## 2020-02-08 MED ORDER — KETOROLAC TROMETHAMINE 30 MG/ML IJ SOLN
15.0000 mg | Freq: Once | INTRAMUSCULAR | Status: AC
  Administered 2020-02-08: 15 mg via INTRAVENOUS
  Filled 2020-02-08: qty 1

## 2020-02-08 MED ORDER — ONDANSETRON HCL 4 MG/2ML IJ SOLN: 4.0000 mg | Freq: Four times a day (QID) | INTRAMUSCULAR | Status: DC | PRN

## 2020-02-08 MED ORDER — PROMETHAZINE HCL 25 MG/ML IJ SOLN
12.5000 mg | Freq: Once | INTRAMUSCULAR | Status: AC
  Administered 2020-02-08: 12.5 mg via INTRAVENOUS
  Filled 2020-02-08: qty 1

## 2020-02-08 MED ORDER — DEXTROSE-NACL 5-0.45 % IV SOLN: INTRAVENOUS | Status: DC

## 2020-02-08 NOTE — Progress Notes (Signed)
Patient admitted to the day infusion hospital for sickle cell pain crisis. Initially, patient reported bilateral leg pain rated 10/10. For pain management, patient placed on Dilaudid PCA, given 15 mg Toradol, 1000 mg Tylenol and hydrated with IV fluids. At discharge, patient rated pain at 4/10. Vital signs stable. Discharge instructions given. Patient alert, oriented and ambulatory at discharge.

## 2020-02-08 NOTE — Discharge Summary (Signed)
[facility] Discharge Summary   Patient ID: Hannah Hawkins MRN: [id number] DOB/AGE: [birth year] 34 y.o.  Admit date: 02/08/2020 Discharge date: 02/08/2020  Primary Care Physician:  Vevelyn Francois, NP  Admission Diagnoses:  Active Problems:   Sickle cell pain crisis [facility])  Discharge Medications:  Allergies as of 02/08/2020      Reactions   Penicillins Swelling, Rash   Has patient had a PCN reaction causing immediate rash, facial/tongue/throat swelling, SOB or lightheadedness with hypotension: Yes Has patient had a PCN reaction causing severe rash involving mucus membranes or skin necrosis: Yes Has patient had a PCN reaction that required hospitalization No Has patient had a PCN reaction occurring within the last 10 years: no If all of the above answers are "NO", then may proceed with Cephalosporin use.      Medication List    TAKE these medications   ergocalciferol 1.25 MG (50000 UT) capsule Commonly known as: VITAMIN D2 Take 1 capsule (50,000 Units total) by mouth once a week.   gabapentin 100 MG capsule Commonly known as: NEURONTIN Take 1 capsule (100 mg total) by mouth 2 (two) times daily.   hydroxyurea 500 MG capsule Commonly known as: HYDREA TAKE 2 CAPSULES(1000 MG) BY MOUTH DAILY   ibuprofen 800 MG tablet Commonly known as: ADVIL TAKE 1 TABLET(800 MG) BY MOUTH EVERY 8 HOURS AS NEEDED FOR MODERATE PAIN What changed: See the new instructions.   lidocaine 5 % Commonly known as: Lidoderm Place 1 patch onto the skin daily. Remove & Discard patch within 12 hours or as directed by MD   montelukast 10 MG tablet Commonly known as: SINGULAIR Take 1 tablet (10 mg total) by mouth at bedtime.   morphine 30 MG 12 hr tablet Commonly known as: MS CONTIN Take 1 tablet (30 mg total) by mouth every 12 (twelve) hours. Must last 30 days.   Narcan 4 MG/0.1ML Liqd nasal spray kit Generic drug: naloxone Place 4 mg into the nose once as needed (overdose).    oxyCODONE 15 MG immediate release tablet Commonly known as: ROXICODONE Take 1 tablet (15 mg total) by mouth every 4 (four) hours as needed for up to 15 days for pain (moderate to severe pain).   pantoprazole 40 MG tablet Commonly known as: PROTONIX Take 1 tablet (40 mg total) by mouth daily.   promethazine 12.5 MG tablet Commonly known as: PHENERGAN TAKE 1 TABLET(12.5 MG) BY MOUTH EVERY 4 HOURS AS NEEDED FOR NAUSEA OR VOMITING   tiZANidine 2 MG tablet Commonly known as: ZANAFLEX Take 1 tablet (2 mg total) by mouth every 8 (eight) hours as needed for muscle spasms.        Consults:  None  Significant Diagnostic Studies:  No results found.  History of present illness: Hannah Hawkins, a 34 year old female with a medical history significant for sickle cell disease, chronic pain syndrome, opiate dependence and tolerance, history of polysubstance abuse, and history of anemia of chronic disease presents with a 1 week history of generalized pain.  Patient states that pain is consistent with previous sickle cell crisis.  Pain is primarily to lower extremities, especially left ankle.  She says that she has been unable to bear weight on left ankle over the past 2 days.  Pain intensity is 10/10 characterized as constant and throbbing.  Patient states that she has been attempting to manage pain at home, but has not been controlled on medications.  She last had oxycodone this a.m. without sustained relief.  She denies  headache, chest pain, nausea, vomiting, fever, chills persistent cough, or urinary symptoms.  No sick contacts, recent travel, or exposure to patient with COVID-19. [facility] Course:  Patient admitted to sickle cell day infusion center for management of pain crisis. Reviewed all laboratory values.  Hemoglobin 11.9, consistent with patient's baseline.  No clinical indication for blood transfusion at this time. All other labs consistent with patient's baseline. Pain  managed with IV Dilaudid via PCA with settings of 0.5 mg, 10-minute lockout, and 2 mg/h. Toradol 15 mg IV x1 Tylenol 1000 mg by mouth x1 IV fluids, D5 0.45% saline at 150 mL/h. Pain decreased to 4/10, which is improved.  Patient does not warrant admission on today.  Advised to follow-up with PCP as scheduled. She is alert, oriented, and ambulating without assistance. Patient will discharge home in a hemodynamically stable condition.  Discharge instructions:  Resume all home medications.   Follow up with PCP as previously  scheduled.   Discussed the importance of drinking 64 ounces of water daily, dehydration of red blood cells may lead further sickling.   Avoid all stressors that precipitate sickle cell pain crisis.     The patient was given clear instructions to go to ER or return to medical center if symptoms do not improve, worsen or new problems develop.    Physical Exam at Discharge:  BP 130/80 (BP Location: Left Arm)   Pulse 60   Temp 98.5 F (36.9 C) (Oral)   Resp 11   LMP 01/24/2020 Comment: spotting   SpO2 100%  Physical Exam Constitutional:      Appearance: Normal appearance.  HENT:     Right Ear: Tympanic membrane normal.  Eyes:     Conjunctiva/sclera: Conjunctivae normal.     Pupils: Pupils are equal, round, and reactive to light.  Cardiovascular:     Rate and Rhythm: Normal rate and regular rhythm.  Pulmonary:     Effort: Pulmonary effort is normal.  Abdominal:     General: Abdomen is flat. Bowel sounds are normal.  Musculoskeletal:        General: Normal range of motion.  Skin:    General: Skin is warm.  Neurological:     General: No focal deficit present.     Mental Status: She is alert. Mental status is at baseline.  Psychiatric:        Mood and Affect: Mood normal.        Behavior: Behavior normal.        Thought Content: Thought content normal.        Judgment: Judgment normal.       Disposition at Discharge: Discharge disposition:  01-Home or Self Care       Discharge Orders: Discharge Instructions    Discharge patient   Complete by: As directed    Discharge disposition: 01-Home or Self Care   Discharge patient date: 02/08/2020      Condition at Discharge:   Stable  Time spent on Discharge:  Greater than 30 minutes.  Signed: Donia Pounds  APRN, MSN, FNP-C [facility] [address] [place],  [address] [phone]  02/08/2020, 3:52 PM

## 2020-02-08 NOTE — Telephone Encounter (Signed)
Patient called, complained of pain in the legs, ankles and knees  rated at 10/10. Denied chest pain, fever, diarrhea, abdominal pain, nausea/vomitting. Screened negative for Covid-19 symptoms. Admitted to having means of transportation without driving self after treatment. Last took 15 mg of Oxycodone and 30 mg of Oxycontin. Per provider, patient can come to the day hospital for treatment. Patient notified, verbalized understanding.

## 2020-02-08 NOTE — H&P (Addendum)
[facility] History and Physical   Date: 02/08/2020  Patient name: Hannah Hawkins Medical record number: [id number] Date of birth: 18-Jul-1986 Age: 34 y.o. Gender: female PCP: Vevelyn Francois, NP  Attending physician: Tresa Garter, MD  Chief Complaint: Sickle cell pain  History of Present Illness: Hannah Hawkins, a 34 year old female with a medical history significant for sickle cell disease, chronic pain syndrome, opiate dependence and tolerance, history of polysubstance abuse, and history of anemia of chronic disease presents with a 1 week history of generalized pain.  Patient says that pain is consistent with previous sickle cell crisis.  Pain is primarily to lower extremities, especially left ankle.  She says that she has been unable to bear weight on left ankle over the past 2 days. Pain intensity is 10/10 characterized as constant and throbbing.  Patient states that she has been attempting to manage pain at home, but it has not been controlled on home medications.  She last had oxycodone this a.m. without sustained relief.  She denies headache, chest pain, fever, chills, persistent cough, nausea, vomiting, or diarrhea. No sick contacts, recent travel, or exposure to COVID-19.  Meds: Medications Prior to Admission  Medication Sig Dispense Refill Last Dose  . ergocalciferol (VITAMIN D2) 1.25 MG (50000 UT) capsule Take 1 capsule (50,000 Units total) by mouth once a week. 12 capsule 3 Past Week at Unknown time  . hydroxyurea (HYDREA) 500 MG capsule TAKE 2 CAPSULES(1000 MG) BY MOUTH DAILY 60 capsule 2 02/07/2020 at Unknown time  . ibuprofen (ADVIL) 800 MG tablet TAKE 1 TABLET(800 MG) BY MOUTH EVERY 8 HOURS AS NEEDED FOR MODERATE PAIN (Patient taking differently: Take 800 mg by mouth every 8 (eight) hours as needed for moderate pain. ) 30 tablet 5 02/08/2020 at Unknown time  . morphine (MS CONTIN) 30 MG 12 hr tablet Take 1 tablet (30 mg total) by mouth every 12 (twelve)  hours. Must last 30 days. 60 tablet 0 02/08/2020 at Unknown time  . oxyCODONE (ROXICODONE) 15 MG immediate release tablet Take 1 tablet (15 mg total) by mouth every 4 (four) hours as needed for up to 15 days for pain (moderate to severe pain). 60 tablet 0 02/08/2020 at Unknown time  . tiZANidine (ZANAFLEX) 2 MG tablet Take 1 tablet (2 mg total) by mouth every 8 (eight) hours as needed for muscle spasms. 90 tablet 1 02/08/2020 at Unknown time  . gabapentin (NEURONTIN) 100 MG capsule Take 1 capsule (100 mg total) by mouth 2 (two) times daily. 90 capsule 0   . lidocaine (LIDODERM) 5 % Place 1 patch onto the skin daily. Remove & Discard patch within 12 hours or as directed by MD 30 patch 1   . montelukast (SINGULAIR) 10 MG tablet Take 1 tablet (10 mg total) by mouth at bedtime. 30 tablet 3   . NARCAN 4 MG/0.1ML LIQD nasal spray kit Place 4 mg into the nose once as needed (overdose).   0   . pantoprazole (PROTONIX) 40 MG tablet Take 1 tablet (40 mg total) by mouth daily. 30 tablet 3   . promethazine (PHENERGAN) 12.5 MG tablet TAKE 1 TABLET(12.5 MG) BY MOUTH EVERY 4 HOURS AS NEEDED FOR NAUSEA OR VOMITING 30 tablet 0     Allergies: Penicillins Past Medical History:  Diagnosis Date  . Sickle cell anemia (HCC)    Past Surgical History:  Procedure Laterality Date  . EYE SURGERY  03/09/2017   left eye  . EYE SURGERY Right 09/2019  right hemerage    Family History  Problem Relation Age of Onset  . Diabetes Father    Social History   Socioeconomic History  . Marital status: Single    Spouse name: Not on file  . Number of children: Not on file  . Years of education: Not on file  . Highest education level: Not on file  Occupational History  . Not on file  Tobacco Use  . Smoking status: Former Smoker    Types: Cigarettes  . Smokeless tobacco: Never Used  Substance and Sexual Activity  . Alcohol use: Not Currently  . Drug use: No  . Sexual activity: Yes  Other Topics Concern  . Not on  file  Social History Narrative  . Not on file   Social Determinants of Health   Financial Resource Strain:   . Difficulty of Paying Living Expenses: Not on file  Food Insecurity:   . Worried About Charity fundraiser in the Last Year: Not on file  . Ran Out of Food in the Last Year: Not on file  Transportation Needs:   . Lack of Transportation (Medical): Not on file  . Lack of Transportation (Non-Medical): Not on file  Physical Activity:   . Days of Exercise per Week: Not on file  . Minutes of Exercise per Session: Not on file  Stress:   . Feeling of Stress : Not on file  Social Connections:   . Frequency of Communication with Friends and Family: Not on file  . Frequency of Social Gatherings with Friends and Family: Not on file  . Attends Religious Services: Not on file  . Active Member of Clubs or Organizations: Not on file  . Attends Archivist Meetings: Not on file  . Marital Status: Not on file  Intimate Partner Violence:   . Fear of Current or Ex-Partner: Not on file  . Emotionally Abused: Not on file  . Physically Abused: Not on file  . Sexually Abused: Not on file   Review of Systems  Constitutional: Negative.   Eyes: Negative.   Respiratory: Negative.   Cardiovascular: Negative.   Gastrointestinal: Negative.   Genitourinary: Negative.   Musculoskeletal: Positive for back pain and joint pain.  Skin: Negative.   Neurological: Negative.   Psychiatric/Behavioral: Negative.      Physical Exam: Blood pressure 125/85, pulse (!) 59, temperature 98.5 F (36.9 C), temperature source Oral, last menstrual period 01/24/2020, SpO2 100 %. Physical Exam Constitutional:      Appearance: Normal appearance.  Eyes:     Pupils: Pupils are equal, round, and reactive to light.  Cardiovascular:     Rate and Rhythm: Normal rate.     Pulses: Normal pulses.     Heart sounds: Normal heart sounds.  Pulmonary:     Effort: Pulmonary effort is normal.     Breath sounds:  Normal breath sounds.  Abdominal:     General: Abdomen is flat. Bowel sounds are normal.  Musculoskeletal:        General: Normal range of motion.  Skin:    General: Skin is warm.  Neurological:     General: No focal deficit present.     Mental Status: She is alert. Mental status is at baseline.  Psychiatric:        Mood and Affect: Mood normal.        Behavior: Behavior normal.        Thought Content: Thought content normal.  Judgment: Judgment normal.      Lab results: Results for orders placed or performed during the hospital encounter of 02/08/20 (from the past 24 hour(s))  CBC with Differential/Platelet     Status: Abnormal (Preliminary result)   Collection Time: 02/08/20 10:19 AM  Result Value Ref Range   WBC PENDING 4.0 - 10.5 K/uL   RBC 3.81 (L) 3.87 - 5.11 MIL/uL   Hemoglobin 11.0 (L) 12.0 - 15.0 g/dL   HCT 30.2 (L) 36.0 - 46.0 %   MCV 79.3 (L) 80.0 - 100.0 fL   MCH 28.9 26.0 - 34.0 pg   MCHC 36.4 (H) 30.0 - 36.0 g/dL   RDW 13.7 11.5 - 15.5 %   Platelets 295 150 - 400 K/uL   nRBC PENDING 0.0 - 0.2 %   Neutrophils Relative % PENDING %   Neutro Abs PENDING 1.7 - 7.7 K/uL   Band Neutrophils PENDING %   Lymphocytes Relative PENDING %   Lymphs Abs PENDING 0.7 - 4.0 K/uL   Monocytes Relative PENDING %   Monocytes Absolute PENDING 0.1 - 1.0 K/uL   Eosinophils Relative PENDING %   Eosinophils Absolute PENDING 0.0 - 0.5 K/uL   Basophils Relative PENDING %   Basophils Absolute PENDING 0.0 - 0.1 K/uL   WBC Morphology PENDING    RBC Morphology PENDING    Smear Review PENDING    Other PENDING %   nRBC PENDING 0 /100 WBC   Metamyelocytes Relative PENDING %   Myelocytes PENDING %   Promyelocytes Relative PENDING %   Blasts PENDING %   Immature Granulocytes PENDING %   Abs Immature Granulocytes PENDING 0.00 - 0.07 K/uL    Imaging results:  No results found.   Assessment & Plan: Patient admitted to sickle cell day infusion center for management of pain  crisis. Pain managed with IV Dilaudid via PCA with settings of 0.5 mg, 10-minute lockout, and 2 mg/h Toradol 15 mg IV x1 dose  Tylenol 1000 mg by mouth x1 dose Promethazine 12.5 mg IV Review CBC with differential, CMP, and reticulocytes as results become available reevaluated in context of functioning in relationship to baseline as her care progresses If pain intensity remains elevated, and/or hemodynamic stability changes consider transitioning to inpatient services for higher level of care  Davis, MSN, FNP-C [facility] [place],  [id number] [phone]  02/08/2020, 10:29 AM

## 2020-02-08 NOTE — Discharge Instructions (Signed)
Sickle Cell Anemia, Adult  Sickle cell anemia is a condition where your red blood cells are shaped like sickles. Red blood cells carry oxygen through the body. Sickle-shaped cells do not live as long as normal red blood cells. They also clump together and block blood from flowing through the blood vessels. This prevents the body from getting enough oxygen. Sickle cell anemia causes organ damage and pain. It also increases the risk of infection. Follow these instructions at home: Medicines  Take over-the-counter and prescription medicines only as told by your doctor.  If you were prescribed an antibiotic medicine, take it as told by your doctor. Do not stop taking the antibiotic even if you start to feel better.  If you develop a fever, do not take medicines to lower the fever right away. Tell your doctor about the fever. Managing pain, stiffness, and swelling  Try these methods to help with pain: ? Use a heating pad. ? Take a warm bath. ? Distract yourself, such as by watching TV. Eating and drinking  Drink enough fluid to keep your pee (urine) clear or pale yellow. Drink more in hot weather and during exercise.  Limit or avoid alcohol.  Eat a healthy diet. Eat plenty of fruits, vegetables, whole grains, and lean protein.  Take vitamins and supplements as told by your doctor. Traveling  When traveling, keep these with you: ? Your medical information. ? The names of your doctors. ? Your medicines.  If you need to take an airplane, talk to your doctor first. Activity  Rest often.  Avoid exercises that make your heart beat much faster, such as jogging. General instructions  Do not use products that have nicotine or tobacco, such as cigarettes and e-cigarettes. If you need help quitting, ask your doctor.  Consider wearing a medical alert bracelet.  Avoid being in high places (high altitudes), such as mountains.  Avoid very hot or cold temperatures.  Avoid places where the  temperature changes a lot.  Keep all follow-up visits as told by your doctor. This is important. Contact a doctor if:  A joint hurts.  Your feet or hands hurt or swell.  You feel tired (fatigued). Get help right away if:  You have symptoms of infection. These include: ? Fever. ? Chills. ? Being very tired. ? Irritability. ? Poor eating. ? Throwing up (vomiting).  You feel dizzy or faint.  You have new stomach pain, especially on the left side.  You have a an erection (priapism) that lasts more than 4 hours.  You have numbness in your arms or legs.  You have a hard time moving your arms or legs.  You have trouble talking.  You have pain that does not go away when you take medicine.  You are short of breath.  You are breathing fast.  You have a long-term cough.  You have pain in your chest.  You have a bad headache.  You have a stiff neck.  Your stomach looks bloated even though you did not eat much.  Your skin is pale.  You suddenly cannot see well. Summary  Sickle cell anemia is a condition where your red blood cells are shaped like sickles.  Follow your doctor's advice on ways to manage pain, food to eat, activities to do, and steps to take for safe travel.  Get medical help right away if you have any signs of infection, such as a fever. This information is not intended to replace advice given to you by   your health care provider. Make sure you discuss any questions you have with your health care provider. Document Revised: 03/30/2019 Document Reviewed: 01/11/2017 Elsevier Patient Education  2020 [employer]  

## 2020-02-10 ENCOUNTER — Other Ambulatory Visit: Payer: Self-pay

## 2020-02-10 ENCOUNTER — Encounter (HOSPITAL_COMMUNITY): Payer: Self-pay | Admitting: Emergency Medicine

## 2020-02-10 ENCOUNTER — Emergency Department (HOSPITAL_COMMUNITY)
Admission: EM | Admit: 2020-02-10 | Discharge: 2020-02-11 | Disposition: A | Discharge status: Home / Self Care | Payer: Medicaid Other | Attending: Emergency Medicine | Admitting: Emergency Medicine

## 2020-02-10 DIAGNOSIS — D5700 Hb-SS disease with crisis, unspecified: Secondary | ICD-10-CM | POA: Insufficient documentation

## 2020-02-10 DIAGNOSIS — Z87891 Personal history of nicotine dependence: Secondary | ICD-10-CM | POA: Insufficient documentation

## 2020-02-10 DIAGNOSIS — M791 Myalgia, unspecified site: Secondary | ICD-10-CM | POA: Diagnosis present

## 2020-02-10 DIAGNOSIS — G894 Chronic pain syndrome: Secondary | ICD-10-CM

## 2020-02-10 MED ORDER — KETOROLAC TROMETHAMINE 15 MG/ML IJ SOLN
15.0000 mg | INTRAMUSCULAR | Status: AC
  Administered 2020-02-11: 15 mg via INTRAVENOUS
  Filled 2020-02-10: qty 1

## 2020-02-10 MED ORDER — HYDROMORPHONE HCL 2 MG/ML IJ SOLN
2.0000 mg | INTRAMUSCULAR | Status: AC
  Administered 2020-02-11: 2 mg via INTRAVENOUS
  Filled 2020-02-10: qty 1

## 2020-02-10 MED ORDER — ONDANSETRON HCL 4 MG/2ML IJ SOLN
4.0000 mg | INTRAMUSCULAR | Status: DC | PRN
  Administered 2020-02-11: 4 mg via INTRAVENOUS
  Filled 2020-02-10: qty 2

## 2020-02-10 NOTE — ED Triage Notes (Signed)
Pt arriving with generalized sickle cell pain. Pt A&O x4 at this time.

## 2020-02-10 NOTE — ED Provider Notes (Signed)
[facility] DEPT Provider Note   CSN: [id number] Arrival date & time: 02/10/20  2235     History Chief Complaint  Patient presents with   Sickle Cell Pain Crisis    Hannah Hawkins is a 34 y.o. female.  Patient with history of sickle cell disease presents with generalized pain c/w previous pain of crisis that started yesterday. No fever, cough, chest pain or vomiting. She reports she is out of her pain medications and is not eligible for refill of her prescription until next week. She was last seen at the [facility] 02/08/20. She reports being out of her medications because she attempted to be seen at the [facility] beginning Tuesday of last week (02/05/20) but reports they were full each morning until Friday when they admitted her so she was forced to take more of her medication than prescribed. She states she is told to do this by her doctor when there is no room at the [facility].    The history is provided by the patient. No language interpreter was used.  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     Family History  Problem Relation Age of Onset   Diabetes Father     Social History    Tobacco Use   Smoking status: Former Smoker    Types: Cigarettes   Smokeless tobacco: Never Used  Substance Use Topics   Alcohol use: Not Currently   Drug use: No    Home Medications Prior to Admission medications   Medication Sig Start Date End Date Taking? Authorizing Provider  ergocalciferol (VITAMIN D2) 1.25 MG (50000 UT) capsule Take 1 capsule (50,000 Units total) by mouth once a week. 05/10/19   Lanae Boast, FNP  gabapentin (NEURONTIN) 100 MG capsule Take 1 capsule (100 mg total) by mouth 2 (two) times daily. 01/24/20   Vevelyn Francois, NP  hydroxyurea (HYDREA) 500 MG capsule TAKE 2 CAPSULES(1000 MG) BY MOUTH DAILY 10/01/19   Tresa Garter, MD  ibuprofen (ADVIL) 800 MG tablet TAKE 1 TABLET(800 MG) BY MOUTH EVERY 8 HOURS AS NEEDED FOR MODERATE PAIN Patient taking differently: Take 800 mg by mouth every 8 (eight) hours as needed for moderate pain.  11/05/19   Tresa Garter, MD  lidocaine (LIDODERM) 5 % Place 1 patch onto the skin daily. Remove & Discard patch within 12 hours or as directed by MD 01/23/20   Vevelyn Francois, NP  montelukast (SINGULAIR) 10 MG tablet Take 1 tablet (10 mg total) by mouth at bedtime. 01/23/20   Vevelyn Francois, NP  morphine (MS CONTIN) 30 MG 12 hr tablet  Take 1 tablet (30 mg total) by mouth every 12 (twelve) hours. Must last 30 days. 01/23/20 02/22/20  Vevelyn Francois, NP  NARCAN 4 MG/0.1ML LIQD nasal spray kit Place 4 mg into the nose once as needed (overdose).  12/22/17   [provider]  oxyCODONE (ROXICODONE) 15 MG immediate release tablet Take 1 tablet (15 mg total) by mouth every 4 (four) hours as needed for up to 15 days for pain (moderate to severe pain). 01/30/20 02/14/20  Vevelyn Francois, NP  pantoprazole (PROTONIX) 40 MG tablet Take 1 tablet (40 mg total) by mouth daily. 05/10/19   Lanae Boast, FNP  promethazine (PHENERGAN) 12.5 MG tablet TAKE 1 TABLET(12.5 MG) BY MOUTH EVERY 4 HOURS AS NEEDED FOR NAUSEA OR VOMITING 09/14/19    Tresa Garter, MD  tiZANidine (ZANAFLEX) 2 MG tablet Take 1 tablet (2 mg total) by mouth every 8 (eight) hours as needed for muscle spasms. 01/23/20 03/23/20  Vevelyn Francois, NP    Allergies    Penicillins  Review of Systems   Review of Systems  Constitutional: Negative for chills and fever.  HENT: Negative.   Respiratory: Negative.   Cardiovascular: Negative.   Gastrointestinal: Negative.   Musculoskeletal: Positive for myalgias.  Skin: Negative.   Neurological: Negative.  Negative for weakness.    Physical Exam Updated Vital Signs BP (!) 131/116 (BP Location: Left Arm)    Pulse 89    Temp 98.3 F (36.8 C) (Oral)    Resp 16    LMP 01/24/2020 Comment: spotting    SpO2 99%   Physical Exam Vitals and nursing note reviewed.  Constitutional:      General: She is not in acute distress.    Appearance: Normal appearance. She is well-developed.  HENT:     Head: Normocephalic.  Cardiovascular:     Rate and Rhythm: Normal rate and regular rhythm.  Pulmonary:     Effort: Pulmonary effort is normal.     Breath sounds: Normal breath sounds. No wheezing, rhonchi or rales.  Chest:     Chest wall: No tenderness.  Abdominal:     General: Bowel sounds are normal.     Palpations: Abdomen is soft.     Tenderness: There is no abdominal tenderness. There is no guarding or rebound.  Musculoskeletal:        General: Normal range of motion.     Cervical back: Normal range of motion and neck supple.  Skin:    General: Skin is warm and dry.     Findings: No rash.  Neurological:     Mental Status: She is alert and oriented to person, place, and time.     ED Results / Procedures / Treatments   Labs (all labs ordered are listed, but only abnormal results are displayed) Labs Reviewed - No data to display  EKG None  Radiology No results found.  Procedures Procedures (including critical care time)  Medications Ordered in ED Medications  HYDROmorphone (DILAUDID) injection 2 mg  (has no administration in time range)  HYDROmorphone (DILAUDID) injection 2 mg (has no administration in time range)  ondansetron (ZOFRAN) injection 4 mg (has no administration in time range)  ketorolac (TORADOL) 15 MG/ML injection 15 mg (has no administration in time range)    ED Course  I have reviewed the triage vital signs and the nursing notes.  Pertinent labs & imaging results that were available during my care of the patient were reviewed by me and considered in my  medical decision making (see chart for details).    MDM Rules/Calculators/A&P                      Patient given medication according to Sickle Cell Protocol. 2nd dose delayed due to hypotension. Plan to move to PO medication if needed.   Labs not performed as she had normal labs 2 days ago and reports her pain is not managed secondary to being out of her pain medication. No fever, cough SOB.   Discussed SQ pain medications being given and she states she knows that would not help her.  Chart reviewed. The patient's last ED visit was over 4 weeks ago. She has had only 3 ED visits in 6 months. Medications given IV. After the second dose, she reports being no better and still in pain. She does not feel that transitioning to PO medication is going to control her symptoms. Discussed that we would have to repeat lab studies to insure no worsening condition, that one additional dose of IV pain medication would be provided and we would re-evaluate after that.   Labs are completely stable and unchanged. Feel the patient is stable for discharge home. Single Percocet given prior to discharge. She reports she has a scheduled appointment with her doctor later today and is encouraged to make that appointment.  Final Clinical Impression(s) / ED Diagnoses Final diagnoses:  None   1. Chronic pain  Rx / DC Orders ED Discharge Orders    None       Charlann Lange, PA-C 02/11/20 [place], Delice Bison, DO 02/11/20 [phone]

## 2020-02-11 ENCOUNTER — Other Ambulatory Visit: Payer: Self-pay

## 2020-02-11 ENCOUNTER — Ambulatory Visit (INDEPENDENT_AMBULATORY_CARE_PROVIDER_SITE_OTHER): Payer: Medicaid Other | Admitting: Nurse Practitioner

## 2020-02-11 ENCOUNTER — Encounter: Payer: Self-pay | Admitting: Nurse Practitioner

## 2020-02-11 DIAGNOSIS — D5720 Sickle-cell/Hb-C disease without crisis: Secondary | ICD-10-CM

## 2020-02-11 DIAGNOSIS — G894 Chronic pain syndrome: Secondary | ICD-10-CM | POA: Diagnosis not present

## 2020-02-11 LAB — CBC WITH DIFFERENTIAL/PLATELET
Abs Immature Granulocytes: 0.01 10*3/uL (ref 0.00–0.07)
Basophils Absolute: 0.1 10*3/uL (ref 0.0–0.1)
Basophils Relative: 1 %
Eosinophils Absolute: 0.3 10*3/uL (ref 0.0–0.5)
Eosinophils Relative: 5 %
HCT: 28.6 % — ABNORMAL LOW (ref 36.0–46.0)
Hemoglobin: 10.5 g/dL — ABNORMAL LOW (ref 12.0–15.0)
Immature Granulocytes: 0 %
Lymphocytes Relative: 36 %
Lymphs Abs: 2.3 10*3/uL (ref 0.7–4.0)
MCH: 28.8 pg (ref 26.0–34.0)
MCHC: 36.7 g/dL — ABNORMAL HIGH (ref 30.0–36.0)
MCV: 78.6 fL — ABNORMAL LOW (ref 80.0–100.0)
Monocytes Absolute: 0.6 10*3/uL (ref 0.1–1.0)
Monocytes Relative: 9 %
Neutro Abs: 3.1 10*3/uL (ref 1.7–7.7)
Neutrophils Relative %: 49 %
Platelets: 319 10*3/uL (ref 150–400)
RBC: 3.64 MIL/uL — ABNORMAL LOW (ref 3.87–5.11)
RDW: 13.3 % (ref 11.5–15.5)
WBC: 6.4 10*3/uL (ref 4.0–10.5)
nRBC: 1.3 % — ABNORMAL HIGH (ref 0.0–0.2)

## 2020-02-11 LAB — COMPREHENSIVE METABOLIC PANEL
ALT: 14 U/L (ref 0–44)
AST: 19 U/L (ref 15–41)
Albumin: 3.8 g/dL (ref 3.5–5.0)
Alkaline Phosphatase: 45 U/L (ref 38–126)
Anion gap: 8 (ref 5–15)
BUN: 9 mg/dL (ref 6–20)
CO2: 25 mmol/L (ref 22–32)
Calcium: 8.9 mg/dL (ref 8.9–10.3)
Chloride: 107 mmol/L (ref 98–111)
Creatinine, Ser: 0.63 mg/dL (ref 0.44–1.00)
GFR calc Af Amer: >60 mL/min (ref >60)
GFR calc non Af Amer: >60 mL/min (ref >60)
Glucose, Bld: 74 mg/dL (ref 70–99)
Potassium: 3.5 mmol/L (ref 3.5–5.1)
Sodium: 140 mmol/L (ref 135–145)
Total Bilirubin: 1 mg/dL (ref 0.3–1.2)
Total Protein: 6.8 g/dL (ref 6.5–8.1)

## 2020-02-11 LAB — POCT URINALYSIS DIPSTICK
Bilirubin, UA: NEGATIVE
Blood, UA: NEGATIVE
Glucose, UA: NEGATIVE
Ketones, UA: NEGATIVE
Nitrite, UA: NEGATIVE
Protein, UA: NEGATIVE
Spec Grav, UA: 1.015 (ref 1.010–1.025)
Urobilinogen, UA: 1 E.U./dL
pH, UA: 5.5 (ref 5.0–8.0)

## 2020-02-11 LAB — RETICULOCYTES
Immature Retic Fract: 35.5 % — ABNORMAL HIGH (ref 2.3–15.9)
RBC.: 3.6 MIL/uL — ABNORMAL LOW (ref 3.87–5.11)
Retic Count, Absolute: 90.4 10*3/uL (ref 19.0–186.0)
Retic Ct Pct: 2.5 % (ref 0.4–3.1)

## 2020-02-11 LAB — I-STAT BETA HCG BLOOD, ED (MC, WL, AP ONLY): I-stat hCG, quantitative: <5 m[IU]/mL (ref <5)

## 2020-02-11 MED ORDER — OXYCODONE HCL 15 MG PO TABS: 15.0000 mg | ORAL_TABLET | ORAL | 0 refills | Status: DC | PRN

## 2020-02-11 MED ORDER — KETAMINE HCL 50 MG/5ML IJ SOSY
0.3000 mg/kg | PREFILLED_SYRINGE | Freq: Once | INTRAMUSCULAR | Status: AC
  Administered 2020-02-11: 02:00:00 20 mg via INTRAVENOUS
  Filled 2020-02-11: qty 5

## 2020-02-11 MED ORDER — MORPHINE SULFATE ER 30 MG PO TBCR: 30.0000 mg | EXTENDED_RELEASE_TABLET | Freq: Two times a day (BID) | ORAL | 0 refills | Status: DC

## 2020-02-11 MED ORDER — OXYCODONE-ACETAMINOPHEN 5-325 MG PO TABS
1.0000 | ORAL_TABLET | Freq: Once | ORAL | Status: AC
  Administered 2020-02-11: 03:00:00 1 via ORAL
  Filled 2020-02-11: qty 1

## 2020-02-11 MED ORDER — HYDROMORPHONE HCL 2 MG/ML IJ SOLN
2.0000 mg | Freq: Once | INTRAMUSCULAR | Status: AC
  Administered 2020-02-11: 2 mg via INTRAVENOUS
  Filled 2020-02-11: qty 1

## 2020-02-11 MED ORDER — ONDANSETRON 4 MG PO TBDP
4.0000 mg | ORAL_TABLET | Freq: Once | ORAL | Status: AC
  Administered 2020-02-11: 4 mg via ORAL
  Filled 2020-02-11: qty 1

## 2020-02-11 NOTE — Progress Notes (Signed)
Established Patient Office Visit  Subjective:  Patient ID: Hannah Hawkins, female    DOB: August 07, 1986  Age: 34 y.o. MRN: [id number]  CC:  Chief Complaint  Patient presents with  . Sickle Cell Anemia  . Medication Refill    oxycodone,     HPI Hannah Hawkins presents for follow-up. has a past medical history of Sickle cell anemia ([place]).  She is complaining of bilateral leg pains.  She rates it a 7-8 out of 10.  She describes it as an aching pain.  She admits the pain has been going on since last week.  She admits to doubling up on her medication because she was unable to get into the day hospital until Friday.  She feels like she may be getting immune to the medication.  She did admit that she tried to not take the hydroxyurea and the gabapentin however this was not a good decision.  She has restarted the medication.  She was seen in the emergency room on yesterday and was given ketamine.  She admits that she is not sleeping well.  She is under increased stress with her significant other. Denies fever, headache, cough, wheezing, shortness of breath, chest pains, abdominal pain, back pain, hip pain, or leg pain.  Past Medical History:  Diagnosis Date  . Sickle cell anemia ([facility])     Past Surgical History:  Procedure Laterality Date  . EYE SURGERY  03/09/2017   left eye  . EYE SURGERY Right 09/2019   right hemerage     Family History  Problem Relation Age of Onset  . Diabetes Father     Social History   Socioeconomic History  . Marital status: Single    Spouse name: Not on file  . Number of children: Not on file  . Years of education: Not on file  . Highest education level: Not on file  Occupational History  . Not on file  Tobacco Use  . Smoking status: Former Smoker    Types: Cigarettes  . Smokeless tobacco: Never Used  Substance and Sexual Activity  . Alcohol use: Not Currently  . Drug use: No  . Sexual activity: Yes  Other Topics Concern  . Not on file  Social  History Narrative  . Not on file   Social Determinants of Health   Financial Resource Strain:   . Difficulty of Paying Living Expenses: Not on file  Food Insecurity:   . Worried About Charity fundraiser in the Last Year: Not on file  . Ran Out of Food in the Last Year: Not on file  Transportation Needs:   . Lack of Transportation (Medical): Not on file  . Lack of Transportation (Non-Medical): Not on file  Physical Activity:   . Days of Exercise per Week: Not on file  . Minutes of Exercise per Session: Not on file  Stress:   . Feeling of Stress : Not on file  Social Connections:   . Frequency of Communication with Friends and Family: Not on file  . Frequency of Social Gatherings with Friends and Family: Not on file  . Attends Religious Services: Not on file  . Active Member of Clubs or Organizations: Not on file  . Attends Archivist Meetings: Not on file  . Marital Status: Not on file  Intimate Partner Violence:   . Fear of Current or Ex-Partner: Not on file  . Emotionally Abused: Not on file  . Physically Abused: Not on file  . Sexually  Abused: Not on file    Outpatient Medications Prior to Visit  Medication Sig Dispense Refill  . ergocalciferol (VITAMIN D2) 1.25 MG (50000 UT) capsule Take 1 capsule (50,000 Units total) by mouth once a week. (Patient taking differently: Take 50,000 Units by mouth every Monday. ) 12 capsule 3  . hydroxyurea (HYDREA) 500 MG capsule TAKE 2 CAPSULES(1000 MG) BY MOUTH DAILY (Patient taking differently: Take 1,000 mg by mouth daily. ) 60 capsule 2  . ibuprofen (ADVIL) 800 MG tablet TAKE 1 TABLET(800 MG) BY MOUTH EVERY 8 HOURS AS NEEDED FOR MODERATE PAIN (Patient taking differently: Take 800 mg by mouth every 8 (eight) hours as needed for moderate pain. ) 30 tablet 5  . levocetirizine (XYZAL) 5 MG tablet Take 5 mg by mouth every evening.    . lidocaine (LIDODERM) 5 % Place 1 patch onto the skin daily. Remove & Discard patch within 12 hours  or as directed by MD (Patient taking differently: Place 1 patch onto the skin daily as needed (pain). Remove & Discard patch within 12 hours or as directed by MD) 30 patch 1  . montelukast (SINGULAIR) 10 MG tablet Take 1 tablet (10 mg total) by mouth at bedtime. 30 tablet 3  . NARCAN 4 MG/0.1ML LIQD nasal spray kit Place 4 mg into the nose once as needed (overdose).   0  . tiZANidine (ZANAFLEX) 2 MG tablet Take 1 tablet (2 mg total) by mouth every 8 (eight) hours as needed for muscle spasms. 90 tablet 1  . morphine (MS CONTIN) 30 MG 12 hr tablet Take 1 tablet (30 mg total) by mouth every 12 (twelve) hours. Must last 30 days. 60 tablet 0  . oxyCODONE (ROXICODONE) 15 MG immediate release tablet Take 1 tablet (15 mg total) by mouth every 4 (four) hours as needed for up to 15 days for pain (moderate to severe pain). 60 tablet 0  . gabapentin (NEURONTIN) 100 MG capsule Take 1 capsule (100 mg total) by mouth 2 (two) times daily. (Patient not taking: Reported on 02/10/2020) 90 capsule 0  . pantoprazole (PROTONIX) 40 MG tablet Take 1 tablet (40 mg total) by mouth daily. (Patient not taking: Reported on 02/10/2020) 30 tablet 3  . promethazine (PHENERGAN) 12.5 MG tablet TAKE 1 TABLET(12.5 MG) BY MOUTH EVERY 4 HOURS AS NEEDED FOR NAUSEA OR VOMITING (Patient not taking: Reported on 02/10/2020) 30 tablet 0   No facility-administered medications prior to visit.    Allergies  Allergen Reactions  . Penicillins Swelling and Rash    Has patient had a PCN reaction causing immediate rash, facial/tongue/throat swelling, SOB or lightheadedness with hypotension: Yes Has patient had a PCN reaction causing severe rash involving mucus membranes or skin necrosis: Yes Has patient had a PCN reaction that required hospitalization No Has patient had a PCN reaction occurring within the last 10 years: no If all of the above answers are "NO", then may proceed with Cephalosporin use.     ROS Review of Systems  All other systems  reviewed and are negative.     Objective:    Physical Exam  Constitutional: She is oriented to person, place, and time. She appears well-developed and well-nourished.  Cardiovascular: Normal rate, regular rhythm and normal heart sounds.  Pulmonary/Chest: Effort normal and breath sounds normal.  Abdominal: Soft. Bowel sounds are normal.  Musculoskeletal:        General: Edema present. Normal range of motion.     Cervical back: Normal range of motion.  Comments: Left ankle  Neurological: She is alert and oriented to person, place, and time.  Skin: Skin is warm and dry.  Psychiatric: She has a normal mood and affect. Her behavior is normal. Judgment and thought content normal.    BP 110/67 (BP Location: Left Arm, Patient Position: Sitting, Cuff Size: Normal)   Pulse (!) 54   Temp 98 F (36.7 C) (Oral)   Resp 16   Ht '5\' 2"'  (1.575 m)   Wt 144 lb (65.3 kg)   LMP 01/24/2020 Comment: spotting   SpO2 100%   BMI 26.34 kg/m  Wt Readings from Last 3 Encounters:  02/11/20 144 lb (65.3 kg)  02/11/20 148 lb (67.1 kg)  01/28/20 148 lb (67.1 kg)     There are no preventive care reminders to display for this patient.  There are no preventive care reminders to display for this patient.  Lab Results  Component Value Date   TSH 0.56 09/30/2017   Lab Results  Component Value Date   WBC 6.4 02/11/2020   HGB 10.5 (L) 02/11/2020   HCT 28.6 (L) 02/11/2020   MCV 78.6 (L) 02/11/2020   PLT 319 02/11/2020   Lab Results  Component Value Date   NA 140 02/11/2020   K 3.5 02/11/2020   CO2 25 02/11/2020   GLUCOSE 74 02/11/2020   BUN 9 02/11/2020   CREATININE 0.63 02/11/2020   BILITOT 1.0 02/11/2020   ALKPHOS 45 02/11/2020   AST 19 02/11/2020   ALT 14 02/11/2020   PROT 6.8 02/11/2020   ALBUMIN 3.8 02/11/2020   CALCIUM 8.9 02/11/2020   ANIONGAP 8 02/11/2020   No results found for: CHOL No results found for: HDL No results found for: LDLCALC No results found for: TRIG No  results found for: CHOLHDL No results found for: HGBA1C    Assessment & Plan:   Problem List Items Addressed This Visit      High   Chronic pain syndrome   Relevant Medications   morphine (MS CONTIN) 30 MG 12 hr tablet (Start on 02/22/2020)   oxyCODONE (ROXICODONE) 15 MG immediate release tablet (Start on 02/14/2020)   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 morphine (MS CONTIN) 30 MG 12 hr tablet    Sig: Take 1 tablet (30 mg total) by mouth every 12 (twelve) hours. Must last 30 days.    Dispense:  60 tablet    Refill:  0    [place] STOP ACT - Not applicable. Fill one day early if pharmacy is closed on scheduled refill date. Do not fill until: 02/22/20 . Must last 30 days. To last until: 03/23/20.    Order Specific Question:   Supervising Provider    Answer:   Tresa Garter [id number]  . oxyCODONE (ROXICODONE) 15 MG immediate release tablet    Sig: Take 1 tablet (15 mg total) by mouth every 4 (four) hours as needed for up to 15 days for pain (moderate to severe pain).    Dispense:  60 tablet    Refill:  0    Do not fill early 02/14/20    Order Specific Question:   Supervising Provider    Answer:   Tresa Garter [[id number]]    Follow-up: Return for Appointment As Scheduled.    Vevelyn Francois, NP

## 2020-02-11 NOTE — Patient Instructions (Signed)
Sickle Cell Anemia, Adult  Sickle cell anemia is a condition where your red blood cells are shaped like sickles. Red blood cells carry oxygen through the body. Sickle-shaped cells do not live as long as normal red blood cells. They also clump together and block blood from flowing through the blood vessels. This prevents the body from getting enough oxygen. Sickle cell anemia causes organ damage and pain. It also increases the risk of infection. Follow these instructions at home: Medicines  Take over-the-counter and prescription medicines only as told by your doctor.  If you were prescribed an antibiotic medicine, take it as told by your doctor. Do not stop taking the antibiotic even if you start to feel better.  If you develop a fever, do not take medicines to lower the fever right away. Tell your doctor about the fever. Managing pain, stiffness, and swelling  Try these methods to help with pain: ? Use a heating pad. ? Take a warm bath. ? Distract yourself, such as by watching TV. Eating and drinking  Drink enough fluid to keep your pee (urine) clear or pale yellow. Drink more in hot weather and during exercise.  Limit or avoid alcohol.  Eat a healthy diet. Eat plenty of fruits, vegetables, whole grains, and lean protein.  Take vitamins and supplements as told by your doctor. Traveling  When traveling, keep these with you: ? Your medical information. ? The names of your doctors. ? Your medicines.  If you need to take an airplane, talk to your doctor first. Activity  Rest often.  Avoid exercises that make your heart beat much faster, such as jogging. General instructions  Do not use products that have nicotine or tobacco, such as cigarettes and e-cigarettes. If you need help quitting, ask your doctor.  Consider wearing a medical alert bracelet.  Avoid being in high places (high altitudes), such as mountains.  Avoid very hot or cold temperatures.  Avoid places where the  temperature changes a lot.  Keep all follow-up visits as told by your doctor. This is important. Contact a doctor if:  A joint hurts.  Your feet or hands hurt or swell.  You feel tired (fatigued). Get help right away if:  You have symptoms of infection. These include: ? Fever. ? Chills. ? Being very tired. ? Irritability. ? Poor eating. ? Throwing up (vomiting).  You feel dizzy or faint.  You have new stomach pain, especially on the left side.  You have a an erection (priapism) that lasts more than 4 hours.  You have numbness in your arms or legs.  You have a hard time moving your arms or legs.  You have trouble talking.  You have pain that does not go away when you take medicine.  You are short of breath.  You are breathing fast.  You have a long-term cough.  You have pain in your chest.  You have a bad headache.  You have a stiff neck.  Your stomach looks bloated even though you did not eat much.  Your skin is pale.  You suddenly cannot see well. Summary  Sickle cell anemia is a condition where your red blood cells are shaped like sickles.  Follow your doctor's advice on ways to manage pain, food to eat, activities to do, and steps to take for safe travel.  Get medical help right away if you have any signs of infection, such as a fever. This information is not intended to replace advice given to you by   your health care provider. Make sure you discuss any questions you have with your health care provider. Document Revised: 03/30/2019 Document Reviewed: 01/11/2017 Elsevier Patient Education  2020 [employer] Inc[employer]  

## 2020-02-11 NOTE — Discharge Instructions (Addendum)
Keep your scheduled appointment with your doctor for later today.

## 2020-02-12 ENCOUNTER — Telehealth: Payer: Self-pay

## 2020-02-12 ENCOUNTER — Encounter (HOSPITAL_COMMUNITY): Payer: Self-pay | Admitting: Family Medicine

## 2020-02-12 ENCOUNTER — Other Ambulatory Visit: Payer: Self-pay

## 2020-02-12 ENCOUNTER — Telehealth (HOSPITAL_COMMUNITY): Payer: Self-pay | Admitting: *Deleted

## 2020-02-12 ENCOUNTER — Non-Acute Institutional Stay (HOSPITAL_COMMUNITY)
Admission: AD | Admit: 2020-02-12 | Discharge: 2020-02-12 | Disposition: A | Discharge status: Home / Self Care | Payer: Medicaid Other | Source: Ambulatory Visit | Attending: Internal Medicine | Admitting: Internal Medicine

## 2020-02-12 DIAGNOSIS — D638 Anemia in other chronic diseases classified elsewhere: Secondary | ICD-10-CM | POA: Diagnosis not present

## 2020-02-12 DIAGNOSIS — Z87891 Personal history of nicotine dependence: Secondary | ICD-10-CM | POA: Insufficient documentation

## 2020-02-12 DIAGNOSIS — G894 Chronic pain syndrome: Secondary | ICD-10-CM | POA: Diagnosis not present

## 2020-02-12 DIAGNOSIS — Z833 Family history of diabetes mellitus: Secondary | ICD-10-CM | POA: Diagnosis not present

## 2020-02-12 DIAGNOSIS — Z79899 Other long term (current) drug therapy: Secondary | ICD-10-CM | POA: Insufficient documentation

## 2020-02-12 DIAGNOSIS — Z880 Allergy status to penicillin: Secondary | ICD-10-CM | POA: Insufficient documentation

## 2020-02-12 DIAGNOSIS — Z791 Long term (current) use of non-steroidal anti-inflammatories (NSAID): Secondary | ICD-10-CM | POA: Diagnosis not present

## 2020-02-12 DIAGNOSIS — F1120 Opioid dependence, uncomplicated: Secondary | ICD-10-CM | POA: Diagnosis not present

## 2020-02-12 DIAGNOSIS — D5700 Hb-SS disease with crisis, unspecified: Secondary | ICD-10-CM | POA: Diagnosis not present

## 2020-02-12 MED ORDER — HYDROMORPHONE HCL 1 MG/ML IJ SOLN
1.0000 mg | Freq: Once | INTRAMUSCULAR | Status: AC
  Administered 2020-02-12: 1 mg via INTRAVENOUS
  Filled 2020-02-12: qty 1

## 2020-02-12 MED ORDER — SODIUM CHLORIDE 0.45 % IV SOLN: INTRAVENOUS | Status: DC

## 2020-02-12 MED ORDER — MORPHINE SULFATE ER 30 MG PO TBCR
30.0000 mg | EXTENDED_RELEASE_TABLET | Freq: Once | ORAL | Status: AC
  Administered 2020-02-12: 30 mg via ORAL
  Filled 2020-02-12: qty 1

## 2020-02-12 MED ORDER — OXYCODONE HCL 5 MG PO TABS
15.0000 mg | ORAL_TABLET | ORAL | Status: DC | PRN
  Administered 2020-02-12 (×3): 15 mg via ORAL
  Filled 2020-02-12 (×3): qty 3

## 2020-02-12 MED ORDER — KETOROLAC TROMETHAMINE 30 MG/ML IJ SOLN
15.0000 mg | Freq: Once | INTRAMUSCULAR | Status: AC
  Administered 2020-02-12: 15 mg via INTRAVENOUS
  Filled 2020-02-12: qty 1

## 2020-02-12 MED ORDER — ACETAMINOPHEN 500 MG PO TABS
1000.0000 mg | ORAL_TABLET | Freq: Once | ORAL | Status: AC
  Administered 2020-02-12: 1000 mg via ORAL
  Filled 2020-02-12: qty 2

## 2020-02-12 MED ORDER — PROMETHAZINE HCL 25 MG/ML IJ SOLN
12.5000 mg | Freq: Four times a day (QID) | INTRAMUSCULAR | Status: DC | PRN
  Administered 2020-02-12: 10:00:00 12.5 mg via INTRAVENOUS
  Filled 2020-02-12: qty 1

## 2020-02-12 NOTE — Telephone Encounter (Signed)
Patient called requesting to come to the day hospital for sickle cell pain. Patient reports generalized pain rated 10/10. Reports being out of prescribed pain medication and taking Ibuprofen. Patient can't fill pain medication until this Thursday. COVID-19 screening done and patient denies all symptoms and contacts. Denies fever, chest pain, nausea, diarrhea and abdominal pain. Admits to having transportation at discharge without driving self. [place], FNP notified. Patient can come to the day hospital for pain management. Patient advised and expresses an understanding.

## 2020-02-12 NOTE — Progress Notes (Signed)
Patient admitted to the day hospital for sickle cell pain. Initially, patient reported generalized pain rated 10/10. For pain management, patient given 2 mg IV Dilaudid, 15 mg Oxycodone x 3, Toradol, Tylenol and MS Contin. Patient also hydrated with IV fluids. At discharge, patient rated pain at 5/10. Vital signs stable. Discharge instructions given. Patient alert, oriented and ambulatory at discharge.

## 2020-02-12 NOTE — H&P (Signed)
[facility] History and Physical   Date: 02/12/2020  Patient name: Hannah Hawkins Medical record number: [id number] Date of birth: 01/22/1986 Age: 34 y.o. Gender: female PCP: Vevelyn Francois, NP  Attending physician: Tresa Garter, MD  Chief Complaint: Sickle cell pain  History of Present Illness: Hannah Hawkins, a 34 year old female with a medical history significant for sickle cell disease type [place], chronic pain syndrome, opiate dependence and tolerance from history of anemia of chronic disease, and history of cocaine use presented to sickle cell day infusion center complaining of bilateral lower extremity pain that is consistent with her typical crisis.  Patient states that pain intensity has been increased over the past several weeks.  Patient states that over this time she has taken more medication than prescribed for greater pain relief.  Now, she is out of medications and has been unable to manage pain at all.  She has been taking ibuprofen and Tylenol without sustained relief.  Pain is primarily to bilateral lower extremities characterized as constant and aching.  Pain intensity is 9/10.  Patient denies fever, chills, headache, shortness of breath, chest pain, urinary symptoms, nausea, vomiting, or diarrhea.  She has no sick contacts, recent travel, or exposure to patient with positive COVID-19 infection.  Meds: Medications Prior to Admission  Medication Sig Dispense Refill Last Dose  . ergocalciferol (VITAMIN D2) 1.25 MG (50000 UT) capsule Take 1 capsule (50,000 Units total) by mouth once a week. (Patient taking differently: Take 50,000 Units by mouth every Monday. ) 12 capsule 3   . gabapentin (NEURONTIN) 100 MG capsule Take 1 capsule (100 mg total) by mouth 2 (two) times daily. (Patient not taking: Reported on 02/10/2020) 90 capsule 0   . hydroxyurea (HYDREA) 500 MG capsule TAKE 2 CAPSULES(1000 MG) BY MOUTH DAILY (Patient taking differently: Take 1,000 mg by  mouth daily. ) 60 capsule 2   . ibuprofen (ADVIL) 800 MG tablet TAKE 1 TABLET(800 MG) BY MOUTH EVERY 8 HOURS AS NEEDED FOR MODERATE PAIN (Patient taking differently: Take 800 mg by mouth every 8 (eight) hours as needed for moderate pain. ) 30 tablet 5   . levocetirizine (XYZAL) 5 MG tablet Take 5 mg by mouth every evening.     . lidocaine (LIDODERM) 5 % Place 1 patch onto the skin daily. Remove & Discard patch within 12 hours or as directed by MD (Patient taking differently: Place 1 patch onto the skin daily as needed (pain). Remove & Discard patch within 12 hours or as directed by MD) 30 patch 1   . montelukast (SINGULAIR) 10 MG tablet Take 1 tablet (10 mg total) by mouth at bedtime. 30 tablet 3   . [START ON 02/22/2020] morphine (MS CONTIN) 30 MG 12 hr tablet Take 1 tablet (30 mg total) by mouth every 12 (twelve) hours. Must last 30 days. 60 tablet 0   . NARCAN 4 MG/0.1ML LIQD nasal spray kit Place 4 mg into the nose once as needed (overdose).   0   . [START ON 02/14/2020] oxyCODONE (ROXICODONE) 15 MG immediate release tablet Take 1 tablet (15 mg total) by mouth every 4 (four) hours as needed for up to 15 days for pain (moderate to severe pain). 60 tablet 0   . pantoprazole (PROTONIX) 40 MG tablet Take 1 tablet (40 mg total) by mouth daily. (Patient not taking: Reported on 02/10/2020) 30 tablet 3   . promethazine (PHENERGAN) 12.5 MG tablet TAKE 1 TABLET(12.5 MG) BY MOUTH EVERY 4 HOURS  AS NEEDED FOR NAUSEA OR VOMITING (Patient not taking: Reported on 02/10/2020) 30 tablet 0   . tiZANidine (ZANAFLEX) 2 MG tablet Take 1 tablet (2 mg total) by mouth every 8 (eight) hours as needed for muscle spasms. 90 tablet 1     Allergies: Penicillins Past Medical History:  Diagnosis Date  . Sickle cell anemia (HCC)    Past Surgical History:  Procedure Laterality Date  . EYE SURGERY  03/09/2017   left eye  . EYE SURGERY Right 09/2019   right [place]merage    Family History  Problem Relation Age of Onset  .  Diabetes Father    Social History   Socioeconomic History  . Marital status: Single    Spouse name: Not on file  . Number of children: Not on file  . Years of education: Not on file  . Highest education level: Not on file  Occupational History  . Not on file  Tobacco Use  . Smoking status: Former Smoker    Types: Cigarettes  . Smokeless tobacco: Never Used  Substance and Sexual Activity  . Alcohol use: Not Currently  . Drug use: No  . Sexual activity: Yes  Other Topics Concern  . Not on file  Social History Narrative  . Not on file   Social Determinants of Health   Financial Resource Strain:   . Difficulty of Paying Living Expenses: Not on file  Food Insecurity:   . Worried About Charity fundraiser in the Last Year: Not on file  . Ran Out of Food in the Last Year: Not on file  Transportation Needs:   . Lack of Transportation (Medical): Not on file  . Lack of Transportation (Non-Medical): Not on file  Physical Activity:   . Days of Exercise per Week: Not on file  . Minutes of Exercise per Session: Not on file  Stress:   . Feeling of Stress : Not on file  Social Connections:   . Frequency of Communication with Friends and Family: Not on file  . Frequency of Social Gatherings with Friends and Family: Not on file  . Attends Religious Services: Not on file  . Active Member of Clubs or Organizations: Not on file  . Attends Archivist Meetings: Not on file  . Marital Status: Not on file  Intimate Partner Violence:   . Fear of Current or Ex-Partner: Not on file  . Emotionally Abused: Not on file  . Physically Abused: Not on file  . Sexually Abused: Not on file   Review of Systems  Constitutional: Negative.   HENT: Negative.   Eyes: Negative.   Respiratory: Negative.   Cardiovascular: Negative.   Gastrointestinal: Negative.   Genitourinary: Negative.   Musculoskeletal: Positive for back pain and joint pain.  Skin: Negative.   Neurological: Negative.    Psychiatric/Behavioral: Negative.     Physical Exam: Blood pressure 114/63, pulse 78, temperature 98.1 F (36.7 C), temperature source Oral, resp. rate 16, last menstrual period 01/24/2020, SpO2 100 %. Physical Exam Constitutional:      Appearance: Normal appearance.  HENT:     Head: Normocephalic.     Nose: Nose normal.     Mouth/Throat:     Mouth: Mucous membranes are moist.     Pharynx: Oropharynx is clear.  Eyes:     Pupils: Pupils are equal, round, and reactive to light.  Cardiovascular:     Rate and Rhythm: Normal rate and regular rhythm.     Pulses: Normal pulses.  Heart sounds: Normal heart sounds.  Pulmonary:     Effort: Pulmonary effort is normal.     Breath sounds: Normal breath sounds.  Abdominal:     General: Abdomen is flat. Bowel sounds are normal.  Musculoskeletal:        General: Normal range of motion.  Skin:    General: Skin is warm.  Neurological:     General: No focal deficit present.     Mental Status: She is alert. Mental status is at baseline.  Psychiatric:        Mood and Affect: Mood normal.        Behavior: Behavior normal.        Thought Content: Thought content normal.        Judgment: Judgment normal.      Lab results: No results found for this or any previous visit (from the past 24 hour(s)).  Imaging results:  No results found.   Assessment & Plan: Patient has not had pain control in greater than 1 week, she was taking more medications than prescribed.  She is out of home medications.  Admitted to sickle cell day infusion center for management of pain crisis. Dilaudid 1 mg IV x1 dose Oxycodone 15 mg every 3 hours as needed MS Contin 30 mg x 1 dose Tylenol 1000 mg x 1 dose Toradol 15 mg IV x1 IV fluids, 0.45% saline at 100 mL/h Pain will be reevaluated in context of functioning in relationship to baseline as patient's care progresses. If pain intensity remains elevated, and/or hemodynamic stability changes, will consider  transitioning to inpatient services for higher level of care.   Donia Pounds  APRN, MSN, FNP-C [facility] [address] [place], [place] [address] [phone]  02/12/2020, 4:03 PM

## 2020-02-12 NOTE — Telephone Encounter (Signed)
Received a refill request for baclofen 10mg . This has fallen off of currently list however pt states she is still on. Was last filled in December. Please advise.

## 2020-02-13 NOTE — Telephone Encounter (Signed)
She is already on one muscle relaxer . Please do not refill Thanks for checking

## 2020-02-13 NOTE — Discharge Summary (Signed)
[facility] Discharge Summary   Patient ID: Hannah Hawkins MRN: [id number] DOB/AGE: 1986/03/04 34 y.o.  Admit date: 02/12/2020 Discharge date: 02/13/2020  Primary Care Physician:  Vevelyn Francois, NP  Admission Diagnoses:  Active Problems:   Sickle cell pain crisis [facility])   Discharge Medications:  Allergies as of 02/12/2020      Reactions   Penicillins Swelling, Rash   Has patient had a PCN reaction causing immediate rash, facial/tongue/throat swelling, SOB or lightheadedness with hypotension: Yes Has patient had a PCN reaction causing severe rash involving mucus membranes or skin necrosis: Yes Has patient had a PCN reaction that required hospitalization No Has patient had a PCN reaction occurring within the last 10 years: no If all of the above answers are "NO", then may proceed with Cephalosporin use.      Medication List    TAKE these medications   ergocalciferol 1.25 MG (50000 UT) capsule Commonly known as: VITAMIN D2 Take 1 capsule (50,000 Units total) by mouth once a week. What changed: when to take this   gabapentin 100 MG capsule Commonly known as: NEURONTIN Take 1 capsule (100 mg total) by mouth 2 (two) times daily.   hydroxyurea 500 MG capsule Commonly known as: HYDREA TAKE 2 CAPSULES(1000 MG) BY MOUTH DAILY What changed: See the new instructions.   ibuprofen 800 MG tablet Commonly known as: ADVIL TAKE 1 TABLET(800 MG) BY MOUTH EVERY 8 HOURS AS NEEDED FOR MODERATE PAIN What changed: See the new instructions.   levocetirizine 5 MG tablet Commonly known as: XYZAL Take 5 mg by mouth every evening.   lidocaine 5 % Commonly known as: Lidoderm Place 1 patch onto the skin daily. Remove & Discard patch within 12 hours or as directed by MD What changed:   when to take this  reasons to take this   montelukast 10 MG tablet Commonly known as: SINGULAIR Take 1 tablet (10 mg total) by mouth at bedtime.   morphine 30 MG 12 hr tablet Commonly  known as: MS CONTIN Take 1 tablet (30 mg total) by mouth every 12 (twelve) hours. Must last 30 days. Start taking on: February 22, 2020   Narcan 4 MG/0.1ML Liqd nasal spray kit Generic drug: naloxone Place 4 mg into the nose once as needed (overdose).   oxyCODONE 15 MG immediate release tablet Commonly known as: ROXICODONE Take 1 tablet (15 mg total) by mouth every 4 (four) hours as needed for up to 15 days for pain (moderate to severe pain). Start taking on: February 14, 2020   pantoprazole 40 MG tablet Commonly known as: PROTONIX Take 1 tablet (40 mg total) by mouth daily.   promethazine 12.5 MG tablet Commonly known as: PHENERGAN TAKE 1 TABLET(12.5 MG) BY MOUTH EVERY 4 HOURS AS NEEDED FOR NAUSEA OR VOMITING   tiZANidine 2 MG tablet Commonly known as: ZANAFLEX Take 1 tablet (2 mg total) by mouth every 8 (eight) hours as needed for muscle spasms.        Consults:  None  Significant Diagnostic Studies:  No results found.  History of present illness:  Hannah Hawkins, a 34 year old female with a medical history significant for sickle cell disease type [place], chronic pain syndrome, opiate dependence and tolerance from history of anemia of chronic disease, and history of cocaine use presented to sickle cell day infusion center complaining of bilateral lower extremity pain that is consistent with her typical crisis.  Patient states that pain intensity has been increased over the past  several weeks.  Patient states that over this time she has taken more medication than prescribed for greater pain relief.  Now, she is out of medications and has been unable to manage pain at all.  She has been taking ibuprofen and Tylenol without sustained relief.  Pain is primarily to bilateral lower extremities characterized as constant and aching.  Pain intensity is 9/10.  Patient denies fever, chills, headache, shortness of breath, chest pain, urinary symptoms, nausea, vomiting, or diarrhea.  She has no  sick contacts, recent travel, or exposure to patient with positive COVID-19 infection.   [facility] Course: Patient admitted to sickle cell day infusion center for management of pain crisis. All laboratory values reviewed, consistent with patient's baseline Pain managed with Dilaudid 1 mg IV x2 doses Oxycodone 15 mg every 3 hours as needed MS Contin 30 mg x 1 dose Tylenol 1000 mg x 1 dose For Adderall 15 mg IV x1 0.45% saline at 100 mL/h Patient's pain intensity decreased to 5/10.  She does not warrant admission on today.  Advised to follow-up with PCP as scheduled. Patient should also pick up medications from pharmacy, which is been specified by PCP Patient alert, oriented, and ambulating without assistance She was discharged home in a hemodynamically stable condition.    Discharge instructions: Resume all home medications.   Follow up with PCP as previously  scheduled.   Discussed the importance of drinking 64 ounces of water daily, dehydration of red blood cells may lead further sickling.   Avoid all stressors that precipitate sickle cell pain crisis.     The patient was given clear instructions to go to ER or return to medical center if symptoms do not improve, worsen or new problems develop.    Physical Exam at Discharge:  BP (!) 117/57 (BP Location: Right Arm)   Pulse 65   Temp 98.1 F (36.7 C) (Oral)   Resp 16   LMP 01/24/2020 Comment: spotting   SpO2 99%   Physical Exam Constitutional:      Appearance: Normal appearance.  HENT:     Mouth/Throat:     Mouth: Mucous membranes are moist.     Pharynx: Oropharynx is clear.  Cardiovascular:     Rate and Rhythm: Normal rate and regular rhythm.     Pulses: Normal pulses.     Heart sounds: Normal heart sounds.  Pulmonary:     Effort: Pulmonary effort is normal.     Breath sounds: Normal breath sounds.  Abdominal:     General: Abdomen is flat. Bowel sounds are normal.  Neurological:     General:  No focal deficit present.     Mental Status: She is alert. Mental status is at baseline.  Psychiatric:        Mood and Affect: Mood normal.        Thought Content: Thought content normal.        Judgment: Judgment normal.     Disposition at Discharge: Discharge disposition: 01-Home or Self Care       Discharge Orders: Discharge Instructions    Discharge patient   Complete by: As directed    Discharge disposition: 01-Home or Self Care   Discharge patient date: 02/12/2020      Condition at Discharge:   Stable  Time spent on Discharge:  Greater than 30 minutes.  Signed:  Donia Pounds  APRN, MSN, FNP-C Patient [facility] [address] [place], [place] [address] [phone]  02/13/2020, 3:14 PM

## 2020-02-19 ENCOUNTER — Telehealth: Payer: Self-pay | Admitting: Nurse Practitioner

## 2020-02-19 NOTE — Telephone Encounter (Signed)
Called pt to remind them of appointment. No answer. Mailbox full.

## 2020-02-20 ENCOUNTER — Non-Acute Institutional Stay (HOSPITAL_BASED_OUTPATIENT_CLINIC_OR_DEPARTMENT_OTHER)
Admission: AD | Admit: 2020-02-20 | Discharge: 2020-02-20 | Disposition: A | Discharge status: Home / Self Care | Payer: Medicaid Other | Source: Ambulatory Visit | Attending: Internal Medicine | Admitting: Internal Medicine

## 2020-02-20 ENCOUNTER — Ambulatory Visit: Payer: Medicaid Other | Admitting: Nurse Practitioner

## 2020-02-20 ENCOUNTER — Telehealth (HOSPITAL_COMMUNITY): Payer: Self-pay | Admitting: General Practice

## 2020-02-20 ENCOUNTER — Encounter (HOSPITAL_COMMUNITY): Payer: Self-pay

## 2020-02-20 ENCOUNTER — Other Ambulatory Visit: Payer: Self-pay

## 2020-02-20 ENCOUNTER — Emergency Department (HOSPITAL_COMMUNITY)
Admission: EM | Admit: 2020-02-20 | Discharge: 2020-02-20 | Disposition: A | Discharge status: Home / Self Care | Payer: Medicaid Other | Attending: Emergency Medicine | Admitting: Emergency Medicine

## 2020-02-20 DIAGNOSIS — Z87898 Personal history of other specified conditions: Secondary | ICD-10-CM

## 2020-02-20 DIAGNOSIS — D5700 Hb-SS disease with crisis, unspecified: Secondary | ICD-10-CM

## 2020-02-20 DIAGNOSIS — Z87891 Personal history of nicotine dependence: Secondary | ICD-10-CM | POA: Insufficient documentation

## 2020-02-20 DIAGNOSIS — F1491 Cocaine use, unspecified, in remission: Secondary | ICD-10-CM

## 2020-02-20 DIAGNOSIS — M79604 Pain in right leg: Secondary | ICD-10-CM | POA: Diagnosis present

## 2020-02-20 LAB — CBC WITH DIFFERENTIAL/PLATELET
Abs Immature Granulocytes: 0.03 10*3/uL (ref 0.00–0.07)
Basophils Absolute: 0.1 10*3/uL (ref 0.0–0.1)
Basophils Relative: 1 %
Eosinophils Absolute: 0.4 10*3/uL (ref 0.0–0.5)
Eosinophils Relative: 4 %
HCT: 29.9 % — ABNORMAL LOW (ref 36.0–46.0)
Hemoglobin: 11.1 g/dL — ABNORMAL LOW (ref 12.0–15.0)
Immature Granulocytes: 0 %
Lymphocytes Relative: 52 %
Lymphs Abs: 4.7 10*3/uL — ABNORMAL HIGH (ref 0.7–4.0)
MCH: 29.5 pg (ref 26.0–34.0)
MCHC: 37.1 g/dL — ABNORMAL HIGH (ref 30.0–36.0)
MCV: 79.5 fL — ABNORMAL LOW (ref 80.0–100.0)
Monocytes Absolute: 0.9 10*3/uL (ref 0.1–1.0)
Monocytes Relative: 10 %
Neutro Abs: 3 10*3/uL (ref 1.7–7.7)
Neutrophils Relative %: 33 %
Platelets: 306 10*3/uL (ref 150–400)
RBC: 3.76 MIL/uL — ABNORMAL LOW (ref 3.87–5.11)
RDW: 13.9 % (ref 11.5–15.5)
WBC: 9.2 10*3/uL (ref 4.0–10.5)
nRBC: 1.3 % — ABNORMAL HIGH (ref 0.0–0.2)

## 2020-02-20 LAB — COMPREHENSIVE METABOLIC PANEL
ALT: 16 U/L (ref 0–44)
AST: 24 U/L (ref 15–41)
Albumin: 4.1 g/dL (ref 3.5–5.0)
Alkaline Phosphatase: 47 U/L (ref 38–126)
Anion gap: 4 — ABNORMAL LOW (ref 5–15)
BUN: 8 mg/dL (ref 6–20)
CO2: 26 mmol/L (ref 22–32)
Calcium: 8.9 mg/dL (ref 8.9–10.3)
Chloride: 107 mmol/L (ref 98–111)
Creatinine, Ser: 0.69 mg/dL (ref 0.44–1.00)
GFR calc Af Amer: >60 mL/min (ref >60)
GFR calc non Af Amer: >60 mL/min (ref >60)
Glucose, Bld: 91 mg/dL (ref 70–99)
Potassium: 4.6 mmol/L (ref 3.5–5.1)
Sodium: 137 mmol/L (ref 135–145)
Total Bilirubin: 0.6 mg/dL (ref 0.3–1.2)
Total Protein: 7.1 g/dL (ref 6.5–8.1)

## 2020-02-20 LAB — RAPID URINE DRUG SCREEN, HOSP PERFORMED
Amphetamines: NOT DETECTED
Barbiturates: NOT DETECTED
Benzodiazepines: NOT DETECTED
Cocaine: NOT DETECTED
Opiates: NOT DETECTED
Tetrahydrocannabinol: NOT DETECTED

## 2020-02-20 LAB — PREGNANCY, URINE: Preg Test, Ur: NEGATIVE

## 2020-02-20 MED ORDER — SODIUM CHLORIDE 0.45 % IV SOLN: INTRAVENOUS | Status: DC

## 2020-02-20 MED ORDER — KETOROLAC TROMETHAMINE 30 MG/ML IJ SOLN
15.0000 mg | Freq: Once | INTRAMUSCULAR | Status: AC
  Administered 2020-02-20: 15 mg via INTRAVENOUS
  Filled 2020-02-20: qty 1

## 2020-02-20 MED ORDER — PROMETHAZINE HCL 25 MG PO TABS: 25.0000 mg | ORAL_TABLET | ORAL | Status: DC | PRN

## 2020-02-20 MED ORDER — HYDROMORPHONE HCL 1 MG/ML IJ SOLN
1.0000 mg | Freq: Once | INTRAMUSCULAR | Status: AC
  Administered 2020-02-20: 1 mg via INTRAVENOUS
  Filled 2020-02-20: qty 1

## 2020-02-20 MED ORDER — OXYCODONE HCL 5 MG PO TABS
15.0000 mg | ORAL_TABLET | Freq: Once | ORAL | Status: AC
  Administered 2020-02-20: 15 mg via ORAL
  Filled 2020-02-20: qty 3

## 2020-02-20 MED ORDER — PROMETHAZINE HCL 25 MG/ML IJ SOLN
12.5000 mg | Freq: Once | INTRAMUSCULAR | Status: AC
  Administered 2020-02-20: 12.5 mg via INTRAVENOUS
  Filled 2020-02-20: qty 1

## 2020-02-20 MED ORDER — HYDROMORPHONE HCL 2 MG/ML IJ SOLN: 2.0000 mg | INTRAMUSCULAR | Status: DC

## 2020-02-20 MED ORDER — ACETAMINOPHEN 500 MG PO TABS
1000.0000 mg | ORAL_TABLET | Freq: Once | ORAL | Status: AC
  Administered 2020-02-20: 1000 mg via ORAL
  Filled 2020-02-20: qty 2

## 2020-02-20 MED ORDER — DIPHENHYDRAMINE HCL 25 MG PO CAPS: 25.0000 mg | ORAL_CAPSULE | ORAL | Status: DC | PRN

## 2020-02-20 MED ORDER — MORPHINE SULFATE ER 30 MG PO TBCR
30.0000 mg | EXTENDED_RELEASE_TABLET | Freq: Once | ORAL | Status: AC
  Administered 2020-02-20: 30 mg via ORAL
  Filled 2020-02-20: qty 1

## 2020-02-20 NOTE — ED Triage Notes (Signed)
Pt reports sickle cell pain in right ankle up calf, pt reports bilateral arm pain. Pt reports pain for 2 days with no relief with home pain medication. Pt denies chest pain or SHOB

## 2020-02-20 NOTE — Telephone Encounter (Signed)
Patient called, complained of pain in the Right leg, ankles and wrists rated at 9/10. Denied chest pain, fever, diarrhea, abdominal pain, nausea/vomitting. Screened negative for Covid-19 symptoms. Admitted to having means of transportation without driving self after treatment. Last took 15 mg of Oxycodone, Ibuprofen 800 mg at 01:00 today. The patient requested that the [facility] will provide transportation to and fro. Per provider, patient should continue to take medications as prescribed, hydrate with 64 ounces of water, interchange Tylenol and Ibuprofen and to call the [facility] back tomorrow morning if the the pain does not improve. Patient notified, verbalized understanding.

## 2020-02-20 NOTE — ED Notes (Signed)
Pt transported to [facility] via [facility] with this RN

## 2020-02-20 NOTE — Discharge Summary (Signed)
[facility] Discharge Summary   Patient ID: Hannah Hawkins MRN: [id number] DOB/AGE: 1986/12/08 34 y.o.  Admit date: 02/20/2020 Discharge date: 02/20/2020  Primary Care Physician:  Vevelyn Francois, NP  Admission Diagnoses:  Principal Problem:   Sickle cell pain crisis [facility]) Active Problems:   History of cocaine use   Discharge Medications:  Allergies as of 02/20/2020      Reactions   Penicillins Swelling, Rash   Has patient had a PCN reaction causing immediate rash, facial/tongue/throat swelling, SOB or lightheadedness with hypotension: Yes Has patient had a PCN reaction causing severe rash involving mucus membranes or skin necrosis: Yes Has patient had a PCN reaction that required hospitalization No Has patient had a PCN reaction occurring within the last 10 years: no If all of the above answers are "NO", then may proceed with Cephalosporin use.      Medication List    TAKE these medications   ergocalciferol 1.25 MG (50000 UT) capsule Commonly known as: VITAMIN D2 Take 1 capsule (50,000 Units total) by mouth once a week. What changed: when to take this   gabapentin 100 MG capsule Commonly known as: NEURONTIN Take 1 capsule (100 mg total) by mouth 2 (two) times daily.   hydroxyurea 500 MG capsule Commonly known as: HYDREA TAKE 2 CAPSULES(1000 MG) BY MOUTH DAILY What changed: See the new instructions.   ibuprofen 800 MG tablet Commonly known as: ADVIL TAKE 1 TABLET(800 MG) BY MOUTH EVERY 8 HOURS AS NEEDED FOR MODERATE PAIN What changed: See the new instructions.   levocetirizine 5 MG tablet Commonly known as: XYZAL Take 5 mg by mouth every evening.   lidocaine 5 % Commonly known as: Lidoderm Place 1 patch onto the skin daily. Remove & Discard patch within 12 hours or as directed by MD What changed:   when to take this  reasons to take this   montelukast 10 MG tablet Commonly known as: SINGULAIR Take 1 tablet (10 mg total) by mouth at  bedtime.   morphine 30 MG 12 hr tablet Commonly known as: MS CONTIN Take 1 tablet (30 mg total) by mouth every 12 (twelve) hours. Must last 30 days. Start taking on: February 22, 2020   Narcan 4 MG/0.1ML Liqd nasal spray kit Generic drug: naloxone Place 4 mg into the nose once as needed (overdose).   oxyCODONE 15 MG immediate release tablet Commonly known as: ROXICODONE Take 1 tablet (15 mg total) by mouth every 4 (four) hours as needed for up to 15 days for pain (moderate to severe pain).   pantoprazole 40 MG tablet Commonly known as: PROTONIX Take 1 tablet (40 mg total) by mouth daily.   promethazine 12.5 MG tablet Commonly known as: PHENERGAN TAKE 1 TABLET(12.5 MG) BY MOUTH EVERY 4 HOURS AS NEEDED FOR NAUSEA OR VOMITING   tiZANidine 2 MG tablet Commonly known as: ZANAFLEX Take 1 tablet (2 mg total) by mouth every 8 (eight) hours as needed for muscle spasms.        Consults:  None  Significant Diagnostic Studies:  No results found.   [facility] Course: Sickle cell disease with pain crisis:  Patient admitted to sickle cell day infusion center for management of pain crisis.  Reviewed labs, consistent with patient's baseline.  Pain managed with dilaudid 1 mg x2 doses Toradol 15 mg IV x1 dose Tylenol 1000 by mouth x1 dose IV fluids, 0.45% saline at 100 mL/h Oxycodone 15 mg x2 doses MS Contin 30 mg x 1  dose Patient does not warrant admission on today.  Asked to follow-up with PCP for medication management appointment She is alert, oriented, and ambulating without assistance.  She will discharge home in a hemodynamically stable condition.  History of cocaine use:  Reviewed urine drug screen, completely void of opiates. Patient reports that she has been taking more medication than prescribed for pain control.  She warrants a medication management appointment with PCP. Recommend pain management referral for this patient.   Discharge instructions:  Hannah Hawkins, a 34 year old female with a medical history significant for sickle cell disease type [place], chronic pain syndrome, opiate dependence and tolerance, history of cocaine use, and history of anemia of chronic disease presents complaining of generalized pain.  Patient says that she is out of her chronic pain medications and will not be able to get refills until 02/22/2020.  She says the pain intensity increased 3 days ago and has not been relieved by ibuprofen, Tylenol, or heat.  Patient transition from the emergency department this a.m. in stable condition.  Agreed with ER provider that patient could transfer to sickle cell day infusion center for pain management and extended observation.  Pain intensity is 9/10 characterized as constant and aching.  Pain is primarily to low back and lower extremities, especially right ankle.  She denies fever, chills, headache, chest pain, shortness of breath, urinary symptoms, nausea, vomiting, or diarrhea.  She denies sick contacts, recent travel, or exposure to COVID-19.  Physical Exam at Discharge:  BP 105/73 (BP Location: Left Arm)   Pulse 86   Temp 97.8 F (36.6 C) (Temporal)   Resp 14   LMP 01/24/2020 Comment: spotting   SpO2 100%   Physical Exam Constitutional:      Appearance: Normal appearance.  HENT:     Mouth/Throat:     Mouth: Mucous membranes are moist.     Pharynx: Oropharynx is clear.  Eyes:     Pupils: Pupils are equal, round, and reactive to light.  Cardiovascular:     Rate and Rhythm: Normal rate and regular rhythm.  Pulmonary:     Effort: Pulmonary effort is normal.     Breath sounds: Normal breath sounds.  Abdominal:     General: Abdomen is flat. Bowel sounds are normal.  Musculoskeletal:        General: Normal range of motion.  Skin:    General: Skin is warm.  Neurological:     General: No focal deficit present.     Mental Status: She is alert. Mental status is at baseline.  Psychiatric:        Mood and Affect: Mood normal.         Behavior: Behavior normal.        Thought Content: Thought content normal.        Judgment: Judgment normal.      Disposition at Discharge: Discharge disposition: 01-Home or Self Care       Discharge Orders: Discharge Instructions    Discharge patient   Complete by: As directed    Discharge disposition: 01-Home or Self Care   Discharge patient date: 02/20/2020      Condition at Discharge:   Stable  Time spent on Discharge:  Greater than 30 minutes.  Signed: Donia Pounds  APRN, MSN, FNP-C [facility] [address] [place], [place] [address] [phone]  02/20/2020, 4:08 PM

## 2020-02-20 NOTE — ED Notes (Signed)
ED Provider at bedside. 

## 2020-02-20 NOTE — Progress Notes (Addendum)
1235 RN rounding on patient, medicated per MAR, WCTM.   1320 RN rounding on patient, pt sleeping, easy to arouse but easily falls back to sleep. VSS, medicated per MAR, WCTM.  1425 Pt ambulated to restroom. Patient asked RN for snack and expressed need for pain medication. RN called [place], NP, no new orders at this time, WCTM the patient. RN back to patient's room to update her, pt sleeping, dozes off easily as RN is still in room. WCTM.   1630 Pt discharged home, IV removed without complications, patient stated her pain was a 6/10 after IV pain dilaudid given, PO tylenol, IV phenergan, PO oxycodone, & PO MS contin given throughout her stay. Reviewed discharge instructions with patient, all questions answered, pt understands without difficulty. Pt taken down via wheelchair to ride provided by Darl Pikes, Child psychotherapist, from the clinic.   1720 Arranged ride picked up patient from lobby. Pt had all belongings.

## 2020-02-20 NOTE — H&P (Signed)
[facility] History and Physical   Date: 02/20/2020  Patient name: Hannah Hawkins Medical record number: [id number] Date of birth: 1986/04/12 Age: 34 y.o. Gender: female PCP: Vevelyn Francois, NP  Attending physician: Tresa Garter, MD  Chief Complaint: Sickle cell pain  History of Present Illness: Hannah Hawkins, a 34 year old female with a medical history significant for sickle cell disease type [place], chronic pain syndrome, opiate dependence and tolerance, history of cocaine use, and history of anemia of chronic disease presents complaining of generalized pain.  Patient says that she is out of her chronic pain medications and will not be able to get refills until 02/22/2020.  She says the pain intensity increased 3 days ago and has not been relieved by ibuprofen, Tylenol, or heat.  Patient transition from the emergency department this a.m. in stable condition.  Agreed with ER provider that patient could transfer to sickle cell day infusion center for pain management and extended observation.  Pain intensity is 9/10 characterized as constant and aching.  Pain is primarily to low back and lower extremities, especially right ankle.  She denies fever, chills, headache, chest pain, shortness of breath, urinary symptoms, nausea, vomiting, or diarrhea.  She denies sick contacts, recent travel, or exposure to COVID-19.  Meds: Medications Prior to Admission  Medication Sig Dispense Refill Last Dose  . ergocalciferol (VITAMIN D2) 1.25 MG (50000 UT) capsule Take 1 capsule (50,000 Units total) by mouth once a week. (Patient taking differently: Take 50,000 Units by mouth every Monday. ) 12 capsule 3 Past Week at Unknown time  . hydroxyurea (HYDREA) 500 MG capsule TAKE 2 CAPSULES(1000 MG) BY MOUTH DAILY (Patient taking differently: Take 1,000 mg by mouth daily. ) 60 capsule 2 02/20/2020 at Unknown time  . ibuprofen (ADVIL) 800 MG tablet TAKE 1 TABLET(800 MG) BY MOUTH EVERY 8 HOURS AS NEEDED  FOR MODERATE PAIN (Patient taking differently: Take 800 mg by mouth every 8 (eight) hours as needed for moderate pain. ) 30 tablet 5 02/20/2020 at Unknown time  . montelukast (SINGULAIR) 10 MG tablet Take 1 tablet (10 mg total) by mouth at bedtime. 30 tablet 3 02/20/2020 at Unknown time  . oxyCODONE (ROXICODONE) 15 MG immediate release tablet Take 1 tablet (15 mg total) by mouth every 4 (four) hours as needed for up to 15 days for pain (moderate to severe pain). 60 tablet 0 02/20/2020 at Unknown time  . gabapentin (NEURONTIN) 100 MG capsule Take 1 capsule (100 mg total) by mouth 2 (two) times daily. 90 capsule 0   . levocetirizine (XYZAL) 5 MG tablet Take 5 mg by mouth every evening.     . lidocaine (LIDODERM) 5 % Place 1 patch onto the skin daily. Remove & Discard patch within 12 hours or as directed by MD (Patient taking differently: Place 1 patch onto the skin daily as needed (pain). Remove & Discard patch within 12 hours or as directed by MD) 30 patch 1   . [START ON 02/22/2020] morphine (MS CONTIN) 30 MG 12 hr tablet Take 1 tablet (30 mg total) by mouth every 12 (twelve) hours. Must last 30 days. 60 tablet 0   . NARCAN 4 MG/0.1ML LIQD nasal spray kit Place 4 mg into the nose once as needed (overdose).   0   . pantoprazole (PROTONIX) 40 MG tablet Take 1 tablet (40 mg total) by mouth daily. (Patient not taking: Reported on 02/10/2020) 30 tablet 3   . promethazine (PHENERGAN) 12.5 MG tablet TAKE 1 TABLET(12.5 MG)  BY MOUTH EVERY 4 HOURS AS NEEDED FOR NAUSEA OR VOMITING (Patient not taking: Reported on 02/10/2020) 30 tablet 0   . tiZANidine (ZANAFLEX) 2 MG tablet Take 1 tablet (2 mg total) by mouth every 8 (eight) hours as needed for muscle spasms. 90 tablet 1     Allergies: Penicillins Past Medical History:  Diagnosis Date  . Sickle cell anemia (HCC)    Past Surgical History:  Procedure Laterality Date  . EYE SURGERY  03/09/2017   left eye  . EYE SURGERY Right 09/2019   right hemerage    Family  History  Problem Relation Age of Onset  . Diabetes Father    Social History   Socioeconomic History  . Marital status: Single    Spouse name: Not on file  . Number of children: Not on file  . Years of education: Not on file  . Highest education level: Not on file  Occupational History  . Not on file  Tobacco Use  . Smoking status: Former Smoker    Types: Cigarettes  . Smokeless tobacco: Never Used  Substance and Sexual Activity  . Alcohol use: Not Currently  . Drug use: No  . Sexual activity: Yes  Other Topics Concern  . Not on file  Social History Narrative  . Not on file   Social Determinants of Health   Financial Resource Strain:   . Difficulty of Paying Living Expenses: Not on file  Food Insecurity:   . Worried About Charity fundraiser in the Last Year: Not on file  . Ran Out of Food in the Last Year: Not on file  Transportation Needs:   . Lack of Transportation (Medical): Not on file  . Lack of Transportation (Non-Medical): Not on file  Physical Activity:   . Days of Exercise per Week: Not on file  . Minutes of Exercise per Session: Not on file  Stress:   . Feeling of Stress : Not on file  Social Connections:   . Frequency of Communication with Friends and Family: Not on file  . Frequency of Social Gatherings with Friends and Family: Not on file  . Attends Religious Services: Not on file  . Active Member of Clubs or Organizations: Not on file  . Attends Archivist Meetings: Not on file  . Marital Status: Not on file  Intimate Partner Violence:   . Fear of Current or Ex-Partner: Not on file  . Emotionally Abused: Not on file  . Physically Abused: Not on file  . Sexually Abused: Not on file   Review of Systems  Constitutional: Negative.  Negative for chills and fever.  HENT: Negative.   Eyes: Negative.   Respiratory: Negative.   Cardiovascular: Negative.   Gastrointestinal: Negative.   Genitourinary: Negative.   Musculoskeletal: Positive  for back pain and joint pain.  Skin: Negative.   Neurological: Negative.   Psychiatric/Behavioral: Negative.      Physical Exam: Blood pressure 105/73, pulse 86, temperature 97.8 F (36.6 C), temperature source Temporal, resp. rate 14, last menstrual period 01/24/2020, SpO2 100 %. Physical Exam Constitutional:      Appearance: Normal appearance.  Eyes:     Pupils: Pupils are equal, round, and reactive to light.  Cardiovascular:     Rate and Rhythm: Normal rate and regular rhythm.     Pulses: Normal pulses.  Pulmonary:     Effort: Pulmonary effort is normal.  Abdominal:     General: Abdomen is flat. Bowel sounds are normal.  Musculoskeletal:        General: Normal range of motion.  Skin:    General: Skin is warm.  Neurological:     General: No focal deficit present.     Mental Status: She is alert. Mental status is at baseline.  Psychiatric:        Mood and Affect: Mood normal.        Behavior: Behavior normal.        Thought Content: Thought content normal.        Judgment: Judgment normal.      Lab results: Results for orders placed or performed during the hospital encounter of 02/20/20 (from the past 24 hour(s))  Pregnancy, urine     Status: None   Collection Time: 02/20/20 11:50 AM  Result Value Ref Range   Preg Test, Ur NEGATIVE NEGATIVE  Rapid urine drug screen (hospital performed)     Status: None   Collection Time: 02/20/20 11:50 AM  Result Value Ref Range   Opiates NONE DETECTED NONE DETECTED   Cocaine NONE DETECTED NONE DETECTED   Benzodiazepines NONE DETECTED NONE DETECTED   Amphetamines NONE DETECTED NONE DETECTED   Tetrahydrocannabinol NONE DETECTED NONE DETECTED   Barbiturates NONE DETECTED NONE DETECTED  Comprehensive metabolic panel     Status: Abnormal   Collection Time: 02/20/20 11:54 AM  Result Value Ref Range   Sodium 137 135 - 145 mmol/L   Potassium 4.6 3.5 - 5.1 mmol/L   Chloride 107 98 - 111 mmol/L   CO2 26 22 - 32 mmol/L   Glucose,  Bld 91 70 - 99 mg/dL   BUN 8 6 - 20 mg/dL   Creatinine, Ser 0.69 0.44 - 1.00 mg/dL   Calcium 8.9 8.9 - 10.3 mg/dL   Total Protein 7.1 6.5 - 8.1 g/dL   Albumin 4.1 3.5 - 5.0 g/dL   AST 24 15 - 41 U/L   ALT 16 0 - 44 U/L   Alkaline Phosphatase 47 38 - 126 U/L   Total Bilirubin 0.6 0.3 - 1.2 mg/dL   GFR calc non Af Amer >60 >60 mL/min   GFR calc Af Amer >60 >60 mL/min   Anion gap 4 (L) 5 - 15  CBC WITH DIFFERENTIAL     Status: Abnormal   Collection Time: 02/20/20 11:54 AM  Result Value Ref Range   WBC 9.2 4.0 - 10.5 K/uL   RBC 3.76 (L) 3.87 - 5.11 MIL/uL   Hemoglobin 11.1 (L) 12.0 - 15.0 g/dL   HCT 29.9 (L) 36.0 - 46.0 %   MCV 79.5 (L) 80.0 - 100.0 fL   MCH 29.5 26.0 - 34.0 pg   MCHC 37.1 (H) 30.0 - 36.0 g/dL   RDW 13.9 11.5 - 15.5 %   Platelets 306 150 - 400 K/uL   nRBC 1.3 (H) 0.0 - 0.2 %   Neutrophils Relative % 33 %   Neutro Abs 3.0 1.7 - 7.7 K/uL   Lymphocytes Relative 52 %   Lymphs Abs 4.7 (H) 0.7 - 4.0 K/uL   Monocytes Relative 10 %   Monocytes Absolute 0.9 0.1 - 1.0 K/uL   Eosinophils Relative 4 %   Eosinophils Absolute 0.4 0.0 - 0.5 K/uL   Basophils Relative 1 %   Basophils Absolute 0.1 0.0 - 0.1 K/uL   Immature Granulocytes 0 %   Abs Immature Granulocytes 0.03 0.00 - 0.07 K/uL    Imaging results:  No results found.   Assessment & Plan: Patient admitted to sickle cell  day infusion center for management of pain crisis. Patient is opiate tolerant. She has a history of polysubstance abuse, review UDS as results become available. Pain will be managed with Dilaudid 1 mg IV x1 dose MS Contin 30 mg x 1 dose Oxycodone 15 mg x 1 dose IV Toradol 15 mg x 1 Tylenol 1000 mg x 1 IV fluids, 0.45% saline at 100 mL/h Review CBC with differential and complete metabolic panel as results become available. Pain will be reevaluated in the context of functioning in relationship to baseline as patient's care progresses. If hemodynamic stability changes or pain intensity does not  improve, consider transitioning to inpatient for higher level of care.    Donia Pounds  APRN, MSN, FNP-C [facility] [address] [place], [place] [address] [phone]  02/20/2020, 3:54 PM

## 2020-02-20 NOTE — Discharge Instructions (Signed)
Sickle Cell Anemia, Adult  Sickle cell anemia is a condition where your red blood cells are shaped like sickles. Red blood cells carry oxygen through the body. Sickle-shaped cells do not live as long as normal red blood cells. They also clump together and block blood from flowing through the blood vessels. This prevents the body from getting enough oxygen. Sickle cell anemia causes organ damage and pain. It also increases the risk of infection. Follow these instructions at home: Medicines  Take over-the-counter and prescription medicines only as told by your doctor.  If you were prescribed an antibiotic medicine, take it as told by your doctor. Do not stop taking the antibiotic even if you start to feel better.  If you develop a fever, do not take medicines to lower the fever right away. Tell your doctor about the fever. Managing pain, stiffness, and swelling  Try these methods to help with pain: ? Use a heating pad. ? Take a warm bath. ? Distract yourself, such as by watching TV. Eating and drinking  Drink enough fluid to keep your pee (urine) clear or pale yellow. Drink more in hot weather and during exercise.  Limit or avoid alcohol.  Eat a healthy diet. Eat plenty of fruits, vegetables, whole grains, and lean protein.  Take vitamins and supplements as told by your doctor. Traveling  When traveling, keep these with you: ? Your medical information. ? The names of your doctors. ? Your medicines.  If you need to take an airplane, talk to your doctor first. Activity  Rest often.  Avoid exercises that make your heart beat much faster, such as jogging. General instructions  Do not use products that have nicotine or tobacco, such as cigarettes and e-cigarettes. If you need help quitting, ask your doctor.  Consider wearing a medical alert bracelet.  Avoid being in high places (high altitudes), such as mountains.  Avoid very hot or cold temperatures.  Avoid places where the  temperature changes a lot.  Keep all follow-up visits as told by your doctor. This is important. Contact a doctor if:  A joint hurts.  Your feet or hands hurt or swell.  You feel tired (fatigued). Get help right away if:  You have symptoms of infection. These include: ? Fever. ? Chills. ? Being very tired. ? Irritability. ? Poor eating. ? Throwing up (vomiting).  You feel dizzy or faint.  You have new stomach pain, especially on the left side.  You have a an erection (priapism) that lasts more than 4 hours.  You have numbness in your arms or legs.  You have a hard time moving your arms or legs.  You have trouble talking.  You have pain that does not go away when you take medicine.  You are short of breath.  You are breathing fast.  You have a long-term cough.  You have pain in your chest.  You have a bad headache.  You have a stiff neck.  Your stomach looks bloated even though you did not eat much.  Your skin is pale.  You suddenly cannot see well. Summary  Sickle cell anemia is a condition where your red blood cells are shaped like sickles.  Follow your doctor's advice on ways to manage pain, food to eat, activities to do, and steps to take for safe travel.  Get medical help right away if you have any signs of infection, such as a fever. This information is not intended to replace advice given to you by   your health care provider. Make sure you discuss any questions you have with your health care provider. Document Revised: 03/30/2019 Document Reviewed: 01/11/2017 Elsevier Patient Education  2020 [employer]  

## 2020-02-20 NOTE — ED Provider Notes (Signed)
[facility] DEPT Provider Note   CSN: [id number] Arrival date & time: 02/20/20  6546     History Chief Complaint  Patient presents with  . Sickle Cell Pain Crisis    Hannah Hawkins is a 34 y.o. female.  HPI 34 year old female presents with a sickle cell pain crisis.  Has been ongoing for a couple days but much worse since last night.  She has been taking her home meds including oxycodone and ibuprofen.  She ran out of her muscle relaxer and her morphine, which is due for a refill in 2 days.  No fevers, vomiting, coughing.  The pain is mostly in her right leg and feels like a typical pain crisis.  She is also having some pain in her wrist.  No joint swelling.   Past Medical History:  Diagnosis Date  . Sickle cell anemia [facility])     Patient Active Problem List   Diagnosis Date Noted  . Hypokalemia 07/17/2019  . GERD (gastroesophageal reflux disease) 05/29/2019  . History of cocaine use 05/18/2019  . Chronic pain syndrome   . Sickle cell crisis ([place]) 05/15/2019  . Cocaine use   . Sickle cell pain crisis ([place]) 11/14/2017  . Anemia of chronic disease 11/13/2017  . Sinusitis, chronic 11/13/2017  . Sickle cell anemia with crisis ([place]) 11/02/2017  . 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 EYE SURGERY  03/09/2017   left eye  . EYE SURGERY Right 09/2019   right hemerage      OB History   No obstetric history on file.     Family History  Problem Relation Age of Onset  . Diabetes Father     Social History   Tobacco Use  . Smoking status: Former Smoker    Types: Cigarettes  . Smokeless tobacco: Never Used  Substance Use Topics  . Alcohol use: Not Currently  . Drug use: No    Home Medications Prior to Admission medications   Medication Sig Start Date End Date Taking? Authorizing Provider  ergocalciferol (VITAMIN D2) 1.25 MG (50000 UT) capsule Take 1 capsule (50,000 Units  total) by mouth once a week. Patient taking differently: Take 50,000 Units by mouth every Monday.  05/10/19   Lanae Boast, FNP  gabapentin (NEURONTIN) 100 MG capsule Take 1 capsule (100 mg total) by mouth 2 (two) times daily. 01/24/20   Vevelyn Francois, NP  hydroxyurea (HYDREA) 500 MG capsule TAKE 2 CAPSULES(1000 MG) BY MOUTH DAILY Patient taking differently: Take 1,000 mg by mouth daily.  10/01/19   Tresa Garter, MD  ibuprofen (ADVIL) 800 MG tablet TAKE 1 TABLET(800 MG) BY MOUTH EVERY 8 HOURS AS NEEDED FOR MODERATE PAIN Patient taking differently: Take 800 mg by mouth every 8 (eight) hours as needed for moderate pain.  11/05/19   Tresa Garter, MD  levocetirizine (XYZAL) 5 MG tablet Take 5 mg by mouth every evening.    [provider]  lidocaine (LIDODERM) 5 % Place 1 patch onto the skin daily. Remove & Discard patch within 12 hours or as directed by MD Patient taking differently: Place 1 patch onto the skin daily as needed (pain). Remove & Discard patch within 12 hours or as directed by MD 01/23/20   Vevelyn Francois, NP  montelukast (SINGULAIR) 10 MG tablet Take 1 tablet (10 mg total) by mouth at bedtime. 01/23/20   Vevelyn Francois, NP  morphine (MS CONTIN) 30 MG 12  hr tablet Take 1 tablet (30 mg total) by mouth every 12 (twelve) hours. Must last 30 days. 02/22/20 03/23/20  Vevelyn Francois, NP  NARCAN 4 MG/0.1ML LIQD nasal spray kit Place 4 mg into the nose once as needed (overdose).  12/22/17   [provider]  oxyCODONE (ROXICODONE) 15 MG immediate release tablet Take 1 tablet (15 mg total) by mouth every 4 (four) hours as needed for up to 15 days for pain (moderate to severe pain). 02/14/20 02/29/20  Vevelyn Francois, NP  pantoprazole (PROTONIX) 40 MG tablet Take 1 tablet (40 mg total) by mouth daily. Patient not taking: Reported on 02/10/2020 05/10/19   Lanae Boast, FNP  promethazine (PHENERGAN) 12.5 MG tablet TAKE 1 TABLET(12.5 MG) BY MOUTH EVERY 4 HOURS AS NEEDED FOR  NAUSEA OR VOMITING Patient not taking: Reported on 02/10/2020 09/14/19   Tresa Garter, MD  tiZANidine (ZANAFLEX) 2 MG tablet Take 1 tablet (2 mg total) by mouth every 8 (eight) hours as needed for muscle spasms. 01/23/20 03/23/20  Vevelyn Francois, NP    Allergies    Penicillins  Review of Systems   Review of Systems  Constitutional: Negative for fever.  Respiratory: Negative for cough.   Gastrointestinal: Negative for vomiting.  Musculoskeletal: Positive for arthralgias. Negative for joint swelling.  All other systems reviewed and are negative.   Physical Exam Updated Vital Signs BP 114/90 (BP Location: Left Arm)   Pulse 61   Temp 97.9 F (36.6 C) (Oral)   Resp 16   Ht 5' 3" (1.6 m)   Wt 65.3 kg   LMP 01/24/2020 Comment: spotting   SpO2 100%   BMI 25.51 kg/m   Physical Exam Vitals and nursing note reviewed.  Constitutional:      General: She is not in acute distress.    Appearance: She is well-developed. She is not ill-appearing or diaphoretic.  HENT:     Head: Normocephalic and atraumatic.     Right Ear: External ear normal.     Left Ear: External ear normal.     Nose: Nose normal.  Eyes:     General:        Right eye: No discharge.        Left eye: No discharge.  Cardiovascular:     Rate and Rhythm: Normal rate and regular rhythm.     Heart sounds: Normal heart sounds.  Pulmonary:     Effort: Pulmonary effort is normal.     Breath sounds: Normal breath sounds.  Abdominal:     Palpations: Abdomen is soft.     Tenderness: There is no abdominal tenderness.  Musculoskeletal:     Comments: Mild nonfocal tenderness to right leg. No calf swelling. No knee or ankle swelling or decreased ROM No wrist swelling  Skin:    General: Skin is warm and dry.  Neurological:     Mental Status: She is alert.  Psychiatric:        Mood and Affect: Mood is not anxious.     ED Results / Procedures / Treatments   Labs (all labs ordered are listed, but only abnormal  results are displayed) Labs Reviewed - No data to display  EKG None  Radiology No results found.  Procedures Procedures (including critical care time)  Medications Ordered in ED Medications - No data to display  ED Course  I have reviewed the triage vital signs and the nursing notes.  Pertinent labs & imaging results that were available during my  care of the patient were reviewed by me and considered in my medical decision making (see chart for details).    MDM Rules/Calculators/A&P                      I discussed with [place] Hollis.  Patient is hemodynamically stable.  Likely sickle cell pain crisis versus chronic pain.  She feels is more likely to be chronic pain as she has run out of her morphine.  She advises she will see in the sickle cell clinic and no IV needed at this time. Final Clinical Impression(s) / ED Diagnoses Final diagnoses:  Sickle cell pain crisis [facility])    Rx / DC Orders ED Discharge Orders    None       Sherwood Gambler, MD 02/20/20 1014

## 2020-02-21 ENCOUNTER — Ambulatory Visit: Payer: Medicaid Other | Admitting: Nurse Practitioner

## 2020-02-22 ENCOUNTER — Ambulatory Visit (INDEPENDENT_AMBULATORY_CARE_PROVIDER_SITE_OTHER): Payer: Medicaid Other | Admitting: Nurse Practitioner

## 2020-02-22 ENCOUNTER — Other Ambulatory Visit: Payer: Self-pay

## 2020-02-22 VITALS — BP 104/58 | HR 83 | Temp 98.2°F | Ht 62.0 in | Wt 144.0 lb

## 2020-02-22 DIAGNOSIS — F320 Major depressive disorder, single episode, mild: Secondary | ICD-10-CM | POA: Diagnosis not present

## 2020-02-22 DIAGNOSIS — E559 Vitamin D deficiency, unspecified: Secondary | ICD-10-CM

## 2020-02-22 DIAGNOSIS — D5700 Hb-SS disease with crisis, unspecified: Secondary | ICD-10-CM

## 2020-02-22 MED ORDER — CHOLECALCIFEROL 125 MCG (5000 UT) PO CAPS: 5000.0000 [IU] | ORAL_CAPSULE | Freq: Every day | ORAL | 0 refills | Status: AC

## 2020-02-22 MED ORDER — CITALOPRAM HYDROBROMIDE 10 MG PO TABS: ORAL_TABLET | ORAL | 0 refills | Status: DC

## 2020-02-22 NOTE — Patient Instructions (Signed)
Living With Depression Everyone experiences occasional disappointment, sadness, and loss in their lives. When you are feeling down, blue, or sad for at least 2 weeks in a row, it may mean that you have depression. Depression can affect your thoughts and feelings, relationships, daily activities, and physical health. It is caused by changes in the way your brain functions. If you receive a diagnosis of depression, your health care provider will tell you which type of depression you have and what treatment options are available to you. If you are living with depression, there are ways to help you recover from it and also ways to prevent it from coming back. How to cope with lifestyle changes Coping with stress     Stress is your body's reaction to life changes and events, both good and bad. Stressful situations may include:  Getting married.  The death of a spouse.  Losing a job.  Retiring.  Having a baby. Stress can last just a few hours or it can be ongoing. Stress can play a major role in depression, so it is important to learn both how to cope with stress and how to think about it differently. Talk with your health care provider or a counselor if you would like to learn more about stress reduction. He or she may suggest some stress reduction techniques, such as:  Music therapy. This can include creating music or listening to music. Choose music that you enjoy and that inspires you.  Mindfulness-based meditation. This kind of meditation can be done while sitting or walking. It involves being aware of your normal breaths, rather than trying to control your breathing.  Centering prayer. This is a kind of meditation that involves focusing on a spiritual word or phrase. Choose a word, phrase, or sacred image that is meaningful to you and that brings you peace.  Deep breathing. To do this, expand your stomach and inhale slowly through your nose. Hold your breath for 3-5 seconds, then exhale  slowly, allowing your stomach muscles to relax.  Muscle relaxation. This involves intentionally tensing muscles then relaxing them. Choose a stress reduction technique that fits your lifestyle and personality. Stress reduction techniques take time and practice to develop. Set aside 5-15 minutes a day to do them. Therapists can offer training in these techniques. The training may be covered by some insurance plans. Other things you can do to manage stress include:  Keeping a stress diary. This can help you learn what triggers your stress and ways to control your response.  Understanding what your limits are and saying no to requests or events that lead to a schedule that is too full.  Thinking about how you respond to certain situations. You may not be able to control everything, but you can control how you react.  Adding humor to your life by watching funny films or TV shows.  Making time for activities that help you relax and not feeling guilty about spending your time this way.  Medicines Your health care provider may suggest certain medicines if he or she feels that they will help improve your condition. Avoid using alcohol and other substances that may prevent your medicines from working properly (may interact). It is also important to:  Talk with your pharmacist or health care provider about all the medicines that you take, their possible side effects, and what medicines are safe to take together.  Make it your goal to take part in all treatment decisions (shared decision-making). This includes giving input on   the side effects of medicines. It is best if shared decision-making with your health care provider is part of your total treatment plan. If your health care provider prescribes a medicine, you may not notice the full benefits of it for 4-8 weeks. Most people who are treated for depression need to be on medicine for at least 6-12 months after they feel better. If you are taking  medicines as part of your treatment, do not stop taking medicines without first talking to your health care provider. You may need to have the medicine slowly decreased (tapered) over time to decrease the risk of harmful side effects. Relationships Your health care provider may suggest family therapy along with individual therapy and drug therapy. While there may not be family problems that are causing you to feel depressed, it is still important to make sure your family learns as much as they can about your mental health. Having your family's support can help make your treatment successful. How to recognize changes in your condition Everyone has a different response to treatment for depression. Recovery from major depression happens when you have not had signs of major depression for two months. This may mean that you will start to:  Have more interest in doing activities.  Feel less hopeless than you did 2 months ago.  Have more energy.  Overeat less often, or have better or improving appetite.  Have better concentration. Your health care provider will work with you to decide the next steps in your recovery. It is also important to recognize when your condition is getting worse. Watch for these signs:  Having fatigue or low energy.  Eating too much or too little.  Sleeping too much or too little.  Feeling restless, agitated, or hopeless.  Having trouble concentrating or making decisions.  Having unexplained physical complaints.  Feeling irritable, angry, or aggressive. Get help as soon as you or your family members notice these symptoms coming back. How to get support and help from others How to talk with friends and family members about your condition  Talking to friends and family members about your condition can provide you with one way to get support and guidance. Reach out to trusted friends or family members, explain your symptoms to them, and let them know that you are  working with a health care provider to treat your depression. Financial resources Not all insurance plans cover mental health care, so it is important to check with your insurance carrier. If paying for co-pays or counseling services is a problem, search for a local or county mental health care center. They may be able to offer public mental health care services at low or no cost when you are not able to see a private health care provider. If you are taking medicine for depression, you may be able to get the generic form, which may be less expensive. Some makers of prescription medicines also offer help to patients who cannot afford the medicines they need. Follow these instructions at home:   Get the right amount and quality of sleep.  Cut down on using caffeine, tobacco, alcohol, and other potentially harmful substances.  Try to exercise, such as walking or lifting small weights.  Take over-the-counter and prescription medicines only as told by your health care provider.  Eat a healthy diet that includes plenty of vegetables, fruits, whole grains, low-fat dairy products, and lean protein. Do not eat a lot of foods that are high in solid fats, added sugars, or salt.    Keep all follow-up visits as told by your health care provider. This is important. Contact a health care provider if:  You stop taking your antidepressant medicines, and you have any of these symptoms: ? Nausea. ? Headache. ? Feeling lightheaded. ? Chills and body aches. ? Not being able to sleep (insomnia).  You or your friends and family think your depression is getting worse. Get help right away if:  You have thoughts of hurting yourself or others. If you ever feel like you may hurt yourself or others, or have thoughts about taking your own life, get help right away. You can go to your nearest emergency department or call:  Your local emergency services (911 in the [place]  A suicide crisis helpline, such as the  National Suicide Prevention Lifeline at 1-800-273-8255. This is open 24-hours a day. Summary  If you are living with depression, there are ways to help you recover from it and also ways to prevent it from coming back.  Work with your health care team to create a management plan that includes counseling, stress management techniques, and healthy lifestyle habits. This information is not intended to replace advice given to you by your health care provider. Make sure you discuss any questions you have with your health care provider. Document Revised: 03/30/2019 Document Reviewed: 11/08/2016 Elsevier Patient Education  2020 Elsevier Inc.  

## 2020-02-22 NOTE — Progress Notes (Signed)
Established Patient Office Visit  Subjective:  Patient ID: Hannah Hawkins, female    DOB: [birth year]  Age: 34 y.o. MRN: [id number]  CC:  Chief Complaint  Patient presents with  . Sickle Cell Anemia    HPI Hannah Hawkins presents for follow up.  has a past medical history of Sickle cell anemia ([place]). She is having upper back pain. She has been having pain 10/10. She got up an took her first dose of mediation at 10 . She has not taken any additional medication today. She admits that she does feel like the medication is not effective. She would like to see if her medication can be adjusted.  She admits that she is under increased stress. She is also dealing with some depression because of some home issues. She would like to try something treatment to see if would be effective. She admits that she is sleeping more and would like to stay in bed. She does not eat regularly. She has decreased interest. She denies any suicidal  homicidal thoughts or ideations.    Past Medical History:  Diagnosis Date  . Sickle cell anemia (HCC)     Past Surgical History:  Procedure Laterality Date  . EYE SURGERY  03/09/2017   left eye  . EYE SURGERY Right 09/2019   right [place]     Family History  Problem Relation Age of Onset  . Diabetes Father     Social History   Socioeconomic History  . Marital status: Single    Spouse name: Not on file  . Number of children: Not on file  . Years of education: Not on file  . Highest education level: Not on file  Occupational History  . Not on file  Tobacco Use  . Smoking status: Former Smoker    Types: Cigarettes  . Smokeless tobacco: Never Used  Substance and Sexual Activity  . Alcohol use: Not Currently  . Drug use: No  . Sexual activity: Yes  Other Topics Concern  . Not on file  Social History Narrative  . Not on file   Social Determinants of Health   Financial Resource Strain:   . Difficulty of Paying Living Expenses: Not on file   Food Insecurity:   . Worried About Charity fundraiser in the Last Year: Not on file  . Ran Out of Food in the Last Year: Not on file  Transportation Needs:   . Lack of Transportation (Medical): Not on file  . Lack of Transportation (Non-Medical): Not on file  Physical Activity:   . Days of Exercise per Week: Not on file  . Minutes of Exercise per Session: Not on file  Stress:   . Feeling of Stress : Not on file  Social Connections:   . Frequency of Communication with Friends and Family: Not on file  . Frequency of Social Gatherings with Friends and Family: Not on file  . Attends Religious Services: Not on file  . Active Member of Clubs or Organizations: Not on file  . Attends Archivist Meetings: Not on file  . Marital Status: Not on file  Intimate Partner Violence:   . Fear of Current or Ex-Partner: Not on file  . Emotionally Abused: Not on file  . Physically Abused: Not on file  . Sexually Abused: Not on file    Outpatient Medications Prior to Visit  Medication Sig Dispense Refill  . ergocalciferol (VITAMIN D2) 1.25 MG (50000 UT) capsule Take 1 capsule (50,000 Units total)  by mouth once a week. (Patient taking differently: Take 50,000 Units by mouth every Monday. ) 12 capsule 3  . hydroxyurea (HYDREA) 500 MG capsule TAKE 2 CAPSULES(1000 MG) BY MOUTH DAILY (Patient taking differently: Take 1,000 mg by mouth daily. ) 60 capsule 2  . ibuprofen (ADVIL) 800 MG tablet TAKE 1 TABLET(800 MG) BY MOUTH EVERY 8 HOURS AS NEEDED FOR MODERATE PAIN (Patient taking differently: Take 800 mg by mouth every 8 (eight) hours as needed for moderate pain. ) 30 tablet 5  . levocetirizine (XYZAL) 5 MG tablet Take 5 mg by mouth every evening.    . lidocaine (LIDODERM) 5 % Place 1 patch onto the skin daily. Remove & Discard patch within 12 hours or as directed by MD (Patient taking differently: Place 1 patch onto the skin daily as needed (pain). Remove & Discard patch within 12 hours or as  directed by MD) 30 patch 1  . montelukast (SINGULAIR) 10 MG tablet Take 1 tablet (10 mg total) by mouth at bedtime. 30 tablet 3  . morphine (MS CONTIN) 30 MG 12 hr tablet Take 1 tablet (30 mg total) by mouth every 12 (twelve) hours. Must last 30 days. 60 tablet 0  . NARCAN 4 MG/0.1ML LIQD nasal spray kit Place 4 mg into the nose once as needed (overdose).   0  . oxyCODONE (ROXICODONE) 15 MG immediate release tablet Take 1 tablet (15 mg total) by mouth every 4 (four) hours as needed for up to 15 days for pain (moderate to severe pain). 60 tablet 0  . tiZANidine (ZANAFLEX) 2 MG tablet Take 1 tablet (2 mg total) by mouth every 8 (eight) hours as needed for muscle spasms. 90 tablet 1  . gabapentin (NEURONTIN) 100 MG capsule Take 1 capsule (100 mg total) by mouth 2 (two) times daily. 90 capsule 0  . pantoprazole (PROTONIX) 40 MG tablet Take 1 tablet (40 mg total) by mouth daily. (Patient not taking: Reported on 02/10/2020) 30 tablet 3  . promethazine (PHENERGAN) 12.5 MG tablet TAKE 1 TABLET(12.5 MG) BY MOUTH EVERY 4 HOURS AS NEEDED FOR NAUSEA OR VOMITING (Patient not taking: Reported on 02/10/2020) 30 tablet 0   No facility-administered medications prior to visit.    Allergies  Allergen Reactions  . Penicillins Swelling and Rash    Has patient had a PCN reaction causing immediate rash, facial/tongue/throat swelling, SOB or lightheadedness with hypotension: Yes Has patient had a PCN reaction causing severe rash involving mucus membranes or skin necrosis: Yes Has patient had a PCN reaction that required hospitalization No Has patient had a PCN reaction occurring within the last 10 years: no If all of the above answers are "NO", then may proceed with Cephalosporin use.     ROS Review of Systems  Constitutional: Positive for unexpected weight change.  HENT: Positive for congestion and postnasal drip.   Eyes: Negative.   Respiratory: Negative.   Cardiovascular: Negative.   Gastrointestinal:  Negative.   Genitourinary: Negative.   Musculoskeletal: Positive for back pain.  Allergic/Immunologic: Negative.   Neurological: Negative.  Negative for syncope.  Hematological: Negative.       Objective:    Physical Exam  Constitutional: She is oriented to person, place, and time. She appears well-developed.  HENT:  Head: Normocephalic.  Cardiovascular: Normal rate.  Pulmonary/Chest: Effort normal.  Abdominal: Soft. Bowel sounds are normal.  Musculoskeletal:        General: Normal range of motion.     Cervical back: Normal range of motion.  Neurological: She is oriented to person, place, and time.  Skin: Skin is warm and dry.  Psychiatric: Her behavior is normal. Judgment and thought content normal.    BP (!) 104/58 (BP Location: Left Arm, Patient Position: Sitting, Cuff Size: Normal)   Pulse 83   Temp 98.2 F (36.8 C) (Oral)   Ht '5\' 2"'  (1.575 m)   Wt 144 lb (65.3 kg)   LMP 01/24/2020 Comment: spotting   BMI 26.34 kg/m  Wt Readings from Last 3 Encounters:  02/22/20 144 lb (65.3 kg)  02/20/20 144 lb (65.3 kg)  02/11/20 144 lb (65.3 kg)     There are no preventive care reminders to display for this patient.  There are no preventive care reminders to display for this patient.  Lab Results  Component Value Date   TSH 0.56 09/30/2017   Lab Results  Component Value Date   WBC 9.2 02/20/2020   HGB 11.1 (L) 02/20/2020   HCT 29.9 (L) 02/20/2020   MCV 79.5 (L) 02/20/2020   PLT 306 02/20/2020   Lab Results  Component Value Date   NA 137 02/20/2020   K 4.6 02/20/2020   CO2 26 02/20/2020   GLUCOSE 91 02/20/2020   BUN 8 02/20/2020   CREATININE 0.69 02/20/2020   BILITOT 0.6 02/20/2020   ALKPHOS 47 02/20/2020   AST 24 02/20/2020   ALT 16 02/20/2020   PROT 7.1 02/20/2020   ALBUMIN 4.1 02/20/2020   CALCIUM 8.9 02/20/2020   ANIONGAP 4 (L) 02/20/2020   No results found for: CHOL No results found for: HDL No results found for: LDLCALC No results found for:  TRIG No results found for: CHOLHDL No results found for: HGBA1C    Assessment & Plan:   Problem List Items Addressed This Visit      High   Sickle cell anemia with crisis ([place]) - Primary   Relevant Orders   Urinalysis Dipstick    Other Visit Diagnoses    Current mild episode of major depressive disorder, unspecified whether recurrent (HCC)       celexa 52m    Relevant Medications   citalopram (CELEXA) 10 MG tablet   Vitamin D insufficiency          Meds ordered this encounter  Medications  . Cholecalciferol 125 MCG (5000 UT) capsule    Sig: Take 1 capsule (5,000 Units total) by mouth daily.    Dispense:  90 capsule    Refill:  0    Order Specific Question:   Supervising Provider    Answer:   JTresa Garter[[id number] . citalopram (CELEXA) 10 MG tablet    Sig: Take 1 tablet (10 mg total) by mouth daily for 14 days, THEN 2 tablets (20 mg total) daily.    Dispense:  90 tablet    Refill:  0    Order Specific Question:   Supervising Provider    Answer:   JTresa Garter[[[id number]   Follow-up: Return in about 2 months (around 04/23/2020).    CVevelyn Francois NP

## 2020-02-25 ENCOUNTER — Other Ambulatory Visit: Payer: Self-pay

## 2020-02-25 ENCOUNTER — Other Ambulatory Visit: Payer: Self-pay | Admitting: Family Medicine

## 2020-02-25 DIAGNOSIS — G894 Chronic pain syndrome: Secondary | ICD-10-CM

## 2020-02-25 MED ORDER — PROMETHAZINE HCL 12.5 MG PO TABS: 12.5000 mg | ORAL_TABLET | ORAL | 0 refills | Status: DC | PRN

## 2020-02-25 NOTE — Telephone Encounter (Signed)
Refill request for oxycodone. Please advise.  

## 2020-02-25 NOTE — Telephone Encounter (Signed)
Can you refuse this at the top or send in with start date of 02/29/2020? The computer won't allow me to refuse due to the type of medication, it says I do not have authority. Thank you!

## 2020-02-25 NOTE — Telephone Encounter (Signed)
The prescription was written for 02/22 however the medication was not filled until 2/25 and so the next day that the medication can be filled is 3/12/ Thanks

## 2020-02-27 MED ORDER — OXYCODONE HCL 15 MG PO TABS: 15.0000 mg | ORAL_TABLET | ORAL | 0 refills | Status: DC | PRN

## 2020-03-07 ENCOUNTER — Ambulatory Visit (INDEPENDENT_AMBULATORY_CARE_PROVIDER_SITE_OTHER): Payer: Medicaid Other | Admitting: Nurse Practitioner

## 2020-03-07 ENCOUNTER — Other Ambulatory Visit: Payer: Self-pay

## 2020-03-07 ENCOUNTER — Encounter: Payer: Self-pay | Admitting: Nurse Practitioner

## 2020-03-07 ENCOUNTER — Telehealth (HOSPITAL_COMMUNITY): Payer: Self-pay | Admitting: *Deleted

## 2020-03-07 ENCOUNTER — Telehealth: Payer: Self-pay | Admitting: Clinical

## 2020-03-07 DIAGNOSIS — Z79891 Long term (current) use of opiate analgesic: Secondary | ICD-10-CM | POA: Diagnosis not present

## 2020-03-07 DIAGNOSIS — D5720 Sickle-cell/Hb-C disease without crisis: Secondary | ICD-10-CM

## 2020-03-07 DIAGNOSIS — G894 Chronic pain syndrome: Secondary | ICD-10-CM

## 2020-03-07 DIAGNOSIS — R609 Edema, unspecified: Secondary | ICD-10-CM | POA: Diagnosis not present

## 2020-03-07 MED ORDER — OXYCODONE HCL 15 MG PO TABS: 15.0000 mg | ORAL_TABLET | ORAL | 0 refills | Status: DC | PRN

## 2020-03-07 MED ORDER — MORPHINE SULFATE ER 30 MG PO TBCR: 30.0000 mg | EXTENDED_RELEASE_TABLET | Freq: Two times a day (BID) | ORAL | 0 refills | Status: DC

## 2020-03-07 NOTE — Patient Instructions (Signed)
Peripheral Edema  Peripheral edema is swelling that is caused by a buildup of fluid. Peripheral edema most often affects the lower legs, ankles, and feet. It can also develop in the arms, hands, and face. The area of the body that has peripheral edema will look swollen. It may also feel heavy or warm. Your clothes may start to feel tight. Pressing on the area may make a temporary dent in your skin. You may not be able to move your swollen arm or leg as much as usual. There are many causes of peripheral edema. It can happen because of a complication of other conditions such as congestive heart failure, kidney disease, or a problem with your blood circulation. It also can be a side effect of certain medicines or because of an infection. It often happens to women during pregnancy. Sometimes, the cause is not known. Follow these instructions at home: Managing pain, stiffness, and swelling   Raise (elevate) your legs while you are sitting or lying down.  Move around often to prevent stiffness and to lessen swelling.  Do not sit or stand for long periods of time.  Wear support stockings as told by your health care provider. Medicines  Take over-the-counter and prescription medicines only as told by your health care provider.  Your health care provider may prescribe medicine to help your body get rid of excess water (diuretic). General instructions  Pay attention to any changes in your symptoms.  Follow instructions from your health care provider about limiting salt (sodium) in your diet. Sometimes, eating less salt may reduce swelling.  Moisturize skin daily to help prevent skin from cracking and draining.  Keep all follow-up visits as told by your health care provider. This is important. Contact a health care provider if you have:  A fever.  Edema that starts suddenly or is getting worse, especially if you are pregnant or have a medical condition.  Swelling in only one leg.  Increased  swelling, redness, or pain in one or both of your legs.  Drainage or sores at the area where you have edema. Get help right away if you:  Develop shortness of breath, especially when you are lying down.  Have pain in your chest or abdomen.  Feel weak.  Feel faint. Summary  Peripheral edema is swelling that is caused by a buildup of fluid. Peripheral edema most often affects the lower legs, ankles, and feet.  Move around often to prevent stiffness and to lessen swelling. Do not sit or stand for long periods of time.  Pay attention to any changes in your symptoms.  Contact a health care provider if you have edema that starts suddenly or is getting worse, especially if you are pregnant or have a medical condition.  Get help right away if you develop shortness of breath, especially when lying down. This information is not intended to replace advice given to you by your health care provider. Make sure you discuss any questions you have with your health care provider. Document Revised: 08/30/2018 Document Reviewed: 08/30/2018 Elsevier Patient Education  2020 [employer] Inc.  

## 2020-03-07 NOTE — Telephone Encounter (Signed)
[facility] Note  CSW received call back from [employer] Medicaid transportation. Completed transportation assessment and patient is now signed up for Medicaid transportation. Called patient and informed her of this and provided instructions on how to call to schedule a ride for her appointments. Patient expressed understanding. CSW remains available as needed.  Hannah Butts, LCSW [facility] [phone]

## 2020-03-07 NOTE — Telephone Encounter (Signed)
Patient called requesting to come to the day hospital for sickle cell pain. Patient advised that the day hospital is at capacity. Per provider, patient advised to continue to take prescribed pain medications around the clock and to alternate Tylenol and Ibuprofen. If pain persists or worsens, patient advised to go to the ED for treatment. Patient expresses an understanding.

## 2020-03-07 NOTE — Progress Notes (Signed)
[place], [place]  [address] Phone:  [phone]   Fax:  [phone]   Established Patient Office Visit  Subjective:  Patient ID: Hannah Hawkins, female    DOB: 08-07-1986  Age: 34 y.o. MRN: [id number]  CC:  Chief Complaint  Patient presents with  . Edema    hands and feet   . Sickle Cell Anemia    HPI  Hannah Hawkins is a 34 y.o. female who presents for evaluation of edema in both hands and feet. The edema has been moderate. Onset of symptoms was 2 days ago, and patient reports symptoms have gradually worsened since that time but is getting better now. She is able to close her hand which she could not do earlier. The edema is present all day. The patient states the problem has been intermittent for the last 2 years maybe. The swelling has been aggravated by use of medication such as advil, hydrea, xyzal , Celexa, oxycodone and morphine. The swelling has been relieved by elevation of involved area and stopping the use of her medications. Associated factors include: nothing. Cardiac risk factors: none. Denies fever, headache, cough, wheezing, shortness of breath, chest pains, abdominal pain, back pain, hip pain, or leg pain. Denies any open wounds, skin irritation.She is not very active during the day.  Last eye exam was     Past Medical History:  Diagnosis Date  . Sickle cell anemia (HCC)     Past Surgical History:  Procedure Laterality Date  . EYE SURGERY  03/09/2017   left eye  . EYE SURGERY Right 09/2019   right [place]mer[place]     Family History  Problem Relation Age of Onset  . Diabetes Father     Social History   Socioeconomic History  . Marital status: Single    Spouse name: Not on file  . Number of children: Not on file  . Years of education: Not on file  . Highest education level: Not on file  Occupational History  . Not on file  Tobacco Use  . Smoking status: Former Smoker    Types: Cigarettes  . Smokeless  tobacco: Never Used  Substance and Sexual Activity  . Alcohol use: Not Currently  . Drug use: No  . Sexual activity: Yes  Other Topics Concern  . Not on file  Social History Narrative  . Not on file   Social Determinants of Health   Financial Resource Strain:   . Difficulty of Paying Living Expenses:   Food Insecurity:   . Worried About Charity fundraiser in the Last Year:   . Arboriculturist in the Last Year:   Transportation Needs:   . Film/video editor (Medical):   Marland Kitchen Lack of Transportation (Non-Medical):   Physical Activity:   . Days of Exercise per Week:   . Minutes of Exercise per Session:   Stress:   . Feeling of Stress :   Social Connections:   . Frequency of Communication with Friends and Family:   . Frequency of Social Gatherings with Friends and Family:   . Attends Religious Services:   . Active Member of Clubs or Organizations:   . Attends Archivist Meetings:   Marland Kitchen Marital Status:   Intimate Partner Violence:   . Fear of Current or Ex-Partner:   . Emotionally Abused:   Marland Kitchen Physically Abused:   . Sexually Abused:     Outpatient Medications Prior to Visit  Medication Sig Dispense Refill  . Cholecalciferol 125 MCG (5000 UT) capsule Take 1 capsule (5,000 Units total) by mouth daily. 90 capsule 0  . citalopram (CELEXA) 10 MG tablet Take 1 tablet (10 mg total) by mouth daily for 14 days, THEN 2 tablets (20 mg total) daily. 90 tablet 0  . ergocalciferol (VITAMIN D2) 1.25 MG (50000 UT) capsule Take 1 capsule (50,000 Units total) by mouth once a week. (Patient taking differently: Take 50,000 Units by mouth every Monday. ) 12 capsule 3  . hydroxyurea (HYDREA) 500 MG capsule TAKE 2 CAPSULES(1000 MG) BY MOUTH DAILY (Patient taking differently: Take 1,000 mg by mouth daily. ) 60 capsule 2  . ibuprofen (ADVIL) 800 MG tablet TAKE 1 TABLET(800 MG) BY MOUTH EVERY 8 HOURS AS NEEDED FOR MODERATE PAIN (Patient taking differently: Take 800 mg by mouth every 8 (eight)  hours as needed for moderate pain. ) 30 tablet 5  . levocetirizine (XYZAL) 5 MG tablet Take 5 mg by mouth every evening.    . lidocaine (LIDODERM) 5 % Place 1 patch onto the skin daily. Remove & Discard patch within 12 hours or as directed by MD (Patient taking differently: Place 1 patch onto the skin daily as needed (pain). Remove & Discard patch within 12 hours or as directed by MD) 30 patch 1  . montelukast (SINGULAIR) 10 MG tablet Take 1 tablet (10 mg total) by mouth at bedtime. 30 tablet 3  . NARCAN 4 MG/0.1ML LIQD nasal spray kit Place 4 mg into the nose once as needed (overdose).   0  . ondansetron (ZOFRAN) 4 MG tablet Take 4 mg by mouth every 8 (eight) hours as needed for nausea or vomiting.    . pantoprazole (PROTONIX) 40 MG tablet Take 1 tablet (40 mg total) by mouth daily. 30 tablet 3  . promethazine (PHENERGAN) 12.5 MG tablet Take 1 tablet (12.5 mg total) by mouth every 4 (four) hours as needed for nausea or vomiting. 30 tablet 0  . tiZANidine (ZANAFLEX) 2 MG tablet Take 1 tablet (2 mg total) by mouth every 8 (eight) hours as needed for muscle spasms. 90 tablet 1  . morphine (MS CONTIN) 30 MG 12 hr tablet Take 1 tablet (30 mg total) by mouth every 12 (twelve) hours. Must last 30 days. 60 tablet 0  . oxyCODONE (ROXICODONE) 15 MG immediate release tablet Take 1 tablet (15 mg total) by mouth every 4 (four) hours as needed for up to 15 days for pain (moderate to severe pain). 60 tablet 0   No facility-administered medications prior to visit.    Allergies  Allergen Reactions  . Penicillins Swelling and Rash    Has patient had a PCN reaction causing immediate rash, facial/tongue/throat swelling, SOB or lightheadedness with hypotension: Yes Has patient had a PCN reaction causing severe rash involving mucus membranes or skin necrosis: Yes Has patient had a PCN reaction that required hospitalization No Has patient had a PCN reaction occurring within the last 10 years: no If all of the above  answers are "NO", then may proceed with Cephalosporin use.    Review of Systems Pertinent items noted in HPI and remainder of comprehensive ROS otherwise negative.   Objective:    BP 116/74 (BP Location: Left Arm, Patient Position: Sitting, Cuff Size: Normal)   Pulse 68   Temp 98.3 F (36.8 C) (Oral)   Resp 16   Ht '5\' 2"'  (1.575 m)   Wt 142 lb (64.4 kg)   LMP 02/29/2020  SpO2 99%   BMI 25.97 kg/m  General appearance: alert, cooperative and no distress Head: Normocephalic, without obvious abnormality, atraumatic Neck: no adenopathy, no carotid bruit, no JVD, supple, symmetrical, trachea midline and thyroid not enlarged, symmetric, no tenderness/mass/nodules Back: symmetric, no curvature. ROM normal. No CVA tenderness. Lungs: clear to auscultation bilaterally Heart: regular rate and rhythm, S1, S2 normal, no murmur, click, rub or gallop Extremities: edema hands and feet trace non pitting Pulses: 2+ and symmetric Skin: Skin color, texture, turgor normal. No rashes or lesions Neurologic: Alert and oriented X 3, normal strength and tone. Normal symmetric reflexes. Normal coordination and gait   \BP 116/74 (BP Location: Left Arm, Patient Position: Sitting, Cuff Size: Normal)   Pulse 68   Temp 98.3 F (36.8 C) (Oral)   Resp 16   Ht '5\' 2"'  (1.575 m)   Wt 142 lb (64.4 kg)   LMP 02/29/2020   SpO2 99%   BMI 25.97 kg/m  Wt Readings from Last 3 Encounters:  03/07/20 142 lb (64.4 kg)  02/22/20 144 lb (65.3 kg)  02/20/20 144 lb (65.3 kg)     There are no preventive care reminders to display for this patient.  There are no preventive care reminders to display for this patient.  Lab Results  Component Value Date   TSH 0.56 09/30/2017   Lab Results  Component Value Date   WBC 9.2 02/20/2020   HGB 11.1 (L) 02/20/2020   HCT 29.9 (L) 02/20/2020   MCV 79.5 (L) 02/20/2020   PLT 306 02/20/2020   Lab Results  Component Value Date   NA 137 02/20/2020   K 4.6 02/20/2020   CO2  26 02/20/2020   GLUCOSE 91 02/20/2020   BUN 8 02/20/2020   CREATININE 0.69 02/20/2020   BILITOT 0.6 02/20/2020   ALKPHOS 47 02/20/2020   AST 24 02/20/2020   ALT 16 02/20/2020   PROT 7.1 02/20/2020   ALBUMIN 4.1 02/20/2020   CALCIUM 8.9 02/20/2020   ANIONGAP 4 (L) 02/20/2020   No results found for: CHOL No results found for: HDL No results found for: LDLCALC No results found for: TRIG No results found for: CHOLHDL No results found for: HGBA1C    Assessment & Plan:   Problem List Items Addressed This Visit      High   Chronic pain syndrome   Relevant Medications   oxyCODONE (ROXICODONE) 15 MG immediate release tablet (Start on 03/15/2020)   morphine (MS CONTIN) 30 MG 12 hr tablet (Start on 03/23/2020)   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 Metabolic Panel (12)   Reticulocytes   Ferritin   025427 11+Oxyco+Alc+Crt-Bund    Other Visit Diagnoses    Swelling    -  Primary   labs pending education provided    Relevant Orders   Comp. Metabolic Panel (12)   Brain natriuretic peptide   Long term prescription opiate use       Relevant Orders   [id number] 11+Oxyco+Alc+Crt-Bund      Meds ordered this encounter  Medications  . oxyCODONE (ROXICODONE) 15 MG immediate release tablet    Sig: Take 1 tablet (15 mg total) by mouth every 4 (four) hours as needed for up to 15 days for pain (moderate to severe pain).  Dispense:  60 tablet    Refill:  0    Please do not fill until 03/15/2020.  Thank you    Order Specific Question:   Supervising Provider    Answer:   Tresa Garter [[id number]]  . morphine (MS CONTIN) 30 MG 12 hr tablet    Sig: Take 1 tablet (30 mg total) by mouth every 12 (twelve) hours. Must last 30 days.    Dispense:  60 tablet     Refill:  0    [place] STOP ACT - Not applicable. Fill one day early if pharmacy is closed on scheduled refill date. Do not fill until: 03/23/20 . Must last 30 days. To last until: 04/22/20    Order Specific Question:   Supervising Provider    Answer:   Tresa Garter [[id number]]    Recommendations: decrease sodium in the diet, elevate feet above the level of the heart whenever possible, increase physical activity and use of compression stockings. The patient was also instructed to call IMMEDIATELY (i.e., day or night) if any cardiopulmonary symptoms occur, especially chest pain, shortness of breath, dyspnea on exertion, paroxysmal nocturnal dyspnea, or orthopnea, and these were explained.   Follow-up: Return in about 2 months (around 05/07/2020).    Vevelyn Francois, NP

## 2020-03-08 LAB — CBC WITH DIFFERENTIAL/PLATELET
Basophils Absolute: 0.2 10*3/uL (ref 0.0–0.2)
Basos: 2 %
EOS (ABSOLUTE): 0.7 10*3/uL — ABNORMAL HIGH (ref 0.0–0.4)
Eos: 6 %
Hematocrit: 28.7 % — ABNORMAL LOW (ref 34.0–46.6)
Hemoglobin: 9.9 g/dL — ABNORMAL LOW (ref 11.1–15.9)
Lymphocytes Absolute: 5 10*3/uL — ABNORMAL HIGH (ref 0.7–3.1)
Lymphs: 45 %
MCH: 30.7 pg (ref 26.6–33.0)
MCHC: 34.5 g/dL (ref 31.5–35.7)
MCV: 89 fL (ref 79–97)
Monocytes Absolute: 0.4 10*3/uL (ref 0.1–0.9)
Monocytes: 4 %
NRBC: 29 % — ABNORMAL HIGH (ref 0–0)
Neutrophils Absolute: 4.7 10*3/uL (ref 1.4–7.0)
Neutrophils: 43 %
Platelets: 311 10*3/uL (ref 150–450)
RBC: 3.23 x10E6/uL — ABNORMAL LOW (ref 3.77–5.28)
RDW: 17.7 % — ABNORMAL HIGH (ref 11.7–15.4)
WBC: 11 10*3/uL — ABNORMAL HIGH (ref 3.4–10.8)

## 2020-03-08 LAB — COMP. METABOLIC PANEL (12)
AST: 19 IU/L (ref 0–40)
Albumin/Globulin Ratio: 1.7 (ref 1.2–2.2)
Albumin: 4 g/dL (ref 3.8–4.8)
Alkaline Phosphatase: 61 IU/L (ref 39–117)
BUN/Creatinine Ratio: 7 — ABNORMAL LOW (ref 9–23)
BUN: 6 mg/dL (ref 6–20)
Bilirubin Total: 0.7 mg/dL (ref 0.0–1.2)
Calcium: 8.9 mg/dL (ref 8.7–10.2)
Chloride: 99 mmol/L (ref 96–106)
Creatinine, Ser: 0.84 mg/dL (ref 0.57–1.00)
GFR calc Af Amer: 104 mL/min/{1.73_m2} (ref >59)
GFR calc non Af Amer: 90 mL/min/{1.73_m2} (ref >59)
Globulin, Total: 2.3 g/dL (ref 1.5–4.5)
Glucose: 84 mg/dL (ref 65–99)
Potassium: 3.3 mmol/L — ABNORMAL LOW (ref 3.5–5.2)
Sodium: 138 mmol/L (ref 134–144)
Total Protein: 6.3 g/dL (ref 6.0–8.5)

## 2020-03-08 LAB — BRAIN NATRIURETIC PEPTIDE: BNP: 27.4 pg/mL (ref 0.0–100.0)

## 2020-03-08 LAB — FERRITIN: Ferritin: 217 ng/mL — ABNORMAL HIGH (ref 15–150)

## 2020-03-08 LAB — RETICULOCYTES: Retic Ct Pct: 6.1 % — ABNORMAL HIGH (ref 0.6–2.6)

## 2020-03-20 ENCOUNTER — Non-Acute Institutional Stay (HOSPITAL_COMMUNITY)
Admission: AD | Admit: 2020-03-20 | Discharge: 2020-03-20 | Disposition: A | Discharge status: Home / Self Care | Payer: Medicaid Other | Source: Ambulatory Visit | Attending: Internal Medicine | Admitting: Internal Medicine

## 2020-03-20 ENCOUNTER — Telehealth (HOSPITAL_COMMUNITY): Payer: Self-pay | Admitting: *Deleted

## 2020-03-20 ENCOUNTER — Other Ambulatory Visit: Payer: Self-pay | Admitting: Internal Medicine

## 2020-03-20 ENCOUNTER — Telehealth: Payer: Self-pay

## 2020-03-20 ENCOUNTER — Other Ambulatory Visit: Payer: Self-pay | Admitting: Nurse Practitioner

## 2020-03-20 DIAGNOSIS — D638 Anemia in other chronic diseases classified elsewhere: Secondary | ICD-10-CM | POA: Diagnosis not present

## 2020-03-20 DIAGNOSIS — D57219 Sickle-cell/Hb-C disease with crisis, unspecified: Secondary | ICD-10-CM

## 2020-03-20 DIAGNOSIS — F1120 Opioid dependence, uncomplicated: Secondary | ICD-10-CM | POA: Diagnosis not present

## 2020-03-20 DIAGNOSIS — Z79899 Other long term (current) drug therapy: Secondary | ICD-10-CM | POA: Insufficient documentation

## 2020-03-20 DIAGNOSIS — G894 Chronic pain syndrome: Secondary | ICD-10-CM | POA: Insufficient documentation

## 2020-03-20 DIAGNOSIS — D5700 Hb-SS disease with crisis, unspecified: Secondary | ICD-10-CM | POA: Diagnosis not present

## 2020-03-20 LAB — CBC WITH DIFFERENTIAL/PLATELET
Abs Immature Granulocytes: 0.02 10*3/uL (ref 0.00–0.07)
Basophils Absolute: 0.1 10*3/uL (ref 0.0–0.1)
Basophils Relative: 1 %
Eosinophils Absolute: 0.5 10*3/uL (ref 0.0–0.5)
Eosinophils Relative: 8 %
HCT: 30.6 % — ABNORMAL LOW (ref 36.0–46.0)
Hemoglobin: 11.6 g/dL — ABNORMAL LOW (ref 12.0–15.0)
Immature Granulocytes: 0 %
Lymphocytes Relative: 35 %
Lymphs Abs: 2 10*3/uL (ref 0.7–4.0)
MCH: 30.7 pg (ref 26.0–34.0)
MCHC: 37.9 g/dL — ABNORMAL HIGH (ref 30.0–36.0)
MCV: 81 fL (ref 80.0–100.0)
Monocytes Absolute: 0.4 10*3/uL (ref 0.1–1.0)
Monocytes Relative: 8 %
Neutro Abs: 2.8 10*3/uL (ref 1.7–7.7)
Neutrophils Relative %: 48 %
Platelets: 535 10*3/uL — ABNORMAL HIGH (ref 150–400)
RBC: 3.78 MIL/uL — ABNORMAL LOW (ref 3.87–5.11)
RDW: 14.1 % (ref 11.5–15.5)
WBC: 5.7 10*3/uL (ref 4.0–10.5)
nRBC: 3 % — ABNORMAL HIGH (ref 0.0–0.2)

## 2020-03-20 LAB — RETICULOCYTES
Immature Retic Fract: 29.6 % — ABNORMAL HIGH (ref 2.3–15.9)
RBC.: 3.81 MIL/uL — ABNORMAL LOW (ref 3.87–5.11)
Retic Count, Absolute: 107.8 10*3/uL (ref 19.0–186.0)
Retic Ct Pct: 2.8 % (ref 0.4–3.1)

## 2020-03-20 MED ORDER — SODIUM CHLORIDE 0.45 % IV SOLN: INTRAVENOUS | Status: DC

## 2020-03-20 MED ORDER — DIPHENHYDRAMINE HCL 25 MG PO CAPS: 25.0000 mg | ORAL_CAPSULE | ORAL | Status: DC | PRN

## 2020-03-20 MED ORDER — POLYETHYLENE GLYCOL 3350 17 G PO PACK: 17.0000 g | PACK | Freq: Every day | ORAL | Status: DC | PRN

## 2020-03-20 MED ORDER — TIZANIDINE HCL 2 MG PO TABS: 2.0000 mg | ORAL_TABLET | Freq: Three times a day (TID) | ORAL | 0 refills | Status: DC | PRN

## 2020-03-20 MED ORDER — NALOXONE HCL 0.4 MG/ML IJ SOLN: 0.4000 mg | INTRAMUSCULAR | Status: DC | PRN

## 2020-03-20 MED ORDER — LIDOCAINE 5 % EX PTCH: 1.0000 | MEDICATED_PATCH | CUTANEOUS | 1 refills | Status: DC

## 2020-03-20 MED ORDER — KETOROLAC TROMETHAMINE 15 MG/ML IJ SOLN
15.0000 mg | Freq: Four times a day (QID) | INTRAMUSCULAR | Status: DC
  Administered 2020-03-20: 15 mg via INTRAVENOUS
  Filled 2020-03-20: qty 1

## 2020-03-20 MED ORDER — SODIUM CHLORIDE 0.9 % IV SOLN
25.0000 mg | INTRAVENOUS | Status: DC | PRN
  Filled 2020-03-20: qty 0.5

## 2020-03-20 MED ORDER — HYDROMORPHONE 1 MG/ML IV SOLN
INTRAVENOUS | Status: DC
  Administered 2020-03-20: 30 mg via INTRAVENOUS
  Administered 2020-03-20: 17 mg via INTRAVENOUS
  Filled 2020-03-20: qty 30

## 2020-03-20 MED ORDER — ONDANSETRON HCL 4 MG/2ML IJ SOLN
4.0000 mg | Freq: Four times a day (QID) | INTRAMUSCULAR | Status: DC | PRN
  Administered 2020-03-20: 4 mg via INTRAVENOUS
  Filled 2020-03-20: qty 2

## 2020-03-20 MED ORDER — SENNOSIDES-DOCUSATE SODIUM 8.6-50 MG PO TABS: 1.0000 | ORAL_TABLET | Freq: Two times a day (BID) | ORAL | Status: DC

## 2020-03-20 MED ORDER — SODIUM CHLORIDE 0.9% FLUSH: 9.0000 mL | INTRAVENOUS | Status: DC | PRN

## 2020-03-20 MED ORDER — IBUPROFEN 800 MG PO TABS: 800.0000 mg | ORAL_TABLET | Freq: Three times a day (TID) | ORAL | 2 refills | Status: AC | PRN

## 2020-03-20 NOTE — Telephone Encounter (Signed)
Patient request refills:  Tizanidine 2 MG & Ibuprofen 800 MG

## 2020-03-20 NOTE — Progress Notes (Addendum)
0940 [facility]. RN called Dr. Hyman Hopes for orders. VSS, Pt reports pain is 10/10, right shoulder. IV inserted and labs drawn. WCTM.   5945 IVFs started, toradol given and PCA pump set up. Pt resting in bed. WCTM.   1030 Pt with congestion from allergies, runny nose. Pt has hard time keeping EtCO2 monitor on, pump keeps alarming, RN educated pt about need to monitor respirations, EtCO2, while using PCA pump. Pt stated she will put her monitor back on. WCTM.   1200 Pt resting in bed, VSS, pt reports pain is now a 5/10, WCTM.   1445 Dr. Hyman Hopes here to assess the patient.   1515 RN texted Dr.Jegede about pain patch, informed pt to follow up with her PCP.   1545 Pt discharged home. RN reviewed discharge paperwork with the pt, informed the pt that Dr. Hyman Hopes called in lidocaine patches to her pharmacy. IV removed without difficulty, VSS. Darl Pikes, Child psychotherapist, provided pt with ride home. Pt instructed to call ride when she was ready to go. Pt understands without assistance. Pt A&Ox4, ambulatory at time of discharge.

## 2020-03-20 NOTE — Discharge Summary (Signed)
Physician Discharge Summary  Hannah Hawkins HAL:[id number] DOB: 19-Jun-1986 DOA: 03/20/2020  PCP: Vevelyn Francois, NP  Admit date: 03/20/2020  Discharge date: 03/20/2020  Time spent: 30 minutes  Discharge Diagnoses:  Active Problems:   Sickle cell anemia with crisis [facility])   Discharge Condition: Stable  Diet recommendation: Regular  History of present illness:  Hannah Hawkins is a 34 y.o. female with history of sickle cell disease, type [place], chronic pain syndrome, opiate dependence and tolerance, cocaine use disorder, and anemia of chronic disease who presented to the day hospital this morning with major complaint of generalized body pain typical of her sickle cell pain crisis.  Patient attributes this pain to change in weather.  She says her pain intensity is about 10/10, not relieved by her Home pain medications.  Her pain is characterized as throbbing, achy and constant.  She denies any fever, headache, chest pain, cough, shortness of breath, urinary symptoms, nausea, vomiting or diarrhea.  She denies any sick contacts, recent travel or exposure to COVID-19.  Hospital Course:  Nimra Puccinelli was admitted to the day hospital with sickle cell painful crisis. Patient was treated with weight based IV Dilaudid PCA, IV Toradol, clinician assisted doses as deemed appropriate and IV fluids. Chee showed significant improvement symptomatically, pain improved from 9 to 5/10 at the time of discharge. Patient was discharged home in a hemodynamically stable condition. Chandra will follow-up at the clinic as previously scheduled, continue with home medications as per prior to admission.  Discharge Instructions We discussed the need for good hydration, monitoring of hydration status, avoidance of heat, cold, stress, and infection triggers. We discussed the need to be compliant with taking Hydrea and other home medications. Arien was reminded of the need to seek medical attention immediately if any  symptom of bleeding, anemia, or infection occurs.  Discharge Exam: Vitals:   03/20/20 1200 03/20/20 1424  BP: 119/61 (!) 115/92  Pulse: 65 67  Resp: 20 10  Temp:    SpO2: 98% 100%    General appearance: alert, cooperative and no distress Eyes: conjunctivae/corneas clear. PERRL, EOM's intact. Fundi benign. Neck: no adenopathy, no carotid bruit, no JVD, supple, symmetrical, trachea midline and thyroid not enlarged, symmetric, no tenderness/mass/nodules Back: symmetric, no curvature. ROM normal. No CVA tenderness. Resp: clear to auscultation bilaterally Chest wall: no tenderness Cardio: regular rate and rhythm, S1, S2 normal, no murmur, click, rub or gallop GI: soft, non-tender; bowel sounds normal; no masses,  no organomegaly Extremities: extremities normal, atraumatic, no cyanosis or edema Pulses: 2+ and symmetric Skin: Skin color, texture, turgor normal. No rashes or lesions Neurologic: Grossly normal  Discharge Instructions    Diet - low sodium heart healthy   Complete by: As directed    Increase activity slowly   Complete by: As directed      Allergies as of 03/20/2020      Reactions   Penicillins Swelling, Rash   Has patient had a PCN reaction causing immediate rash, facial/tongue/throat swelling, SOB or lightheadedness with hypotension: Yes Has patient had a PCN reaction causing severe rash involving mucus membranes or skin necrosis: Yes Has patient had a PCN reaction that required hospitalization No Has patient had a PCN reaction occurring within the last 10 years: no If all of the above answers are "NO", then may proceed with Cephalosporin use.      Medication List    TAKE these medications   Cholecalciferol 125 MCG (5000 UT) capsule Take 1 capsule (5,000 Units total) by mouth  daily.   citalopram 10 MG tablet Commonly known as: CeleXA Take 1 tablet (10 mg total) by mouth daily for 14 days, THEN 2 tablets (20 mg total) daily. Start taking on: February 22, 2020    ergocalciferol 1.25 MG (50000 UT) capsule Commonly known as: VITAMIN D2 Take 1 capsule (50,000 Units total) by mouth once a week. What changed: when to take this   hydroxyurea 500 MG capsule Commonly known as: HYDREA TAKE 2 CAPSULES(1000 MG) BY MOUTH DAILY What changed: See the new instructions.   ibuprofen 800 MG tablet Commonly known as: ADVIL TAKE 1 TABLET(800 MG) BY MOUTH EVERY 8 HOURS AS NEEDED FOR MODERATE PAIN What changed: See the new instructions.   levocetirizine 5 MG tablet Commonly known as: XYZAL Take 5 mg by mouth every evening.   lidocaine 5 % Commonly known as: Lidoderm Place 1 patch onto the skin daily. Remove & Discard patch within 12 hours or as directed by MD What changed:   when to take this  reasons to take this   montelukast 10 MG tablet Commonly known as: SINGULAIR Take 1 tablet (10 mg total) by mouth at bedtime.   morphine 30 MG 12 hr tablet Commonly known as: MS CONTIN Take 1 tablet (30 mg total) by mouth every 12 (twelve) hours. Must last 30 days. Start taking on: March 23, 2020   Narcan 4 MG/0.1ML Liqd nasal spray kit Generic drug: naloxone Place 4 mg into the nose once as needed (overdose).   ondansetron 4 MG tablet Commonly known as: ZOFRAN Take 4 mg by mouth every 8 (eight) hours as needed for nausea or vomiting.   oxyCODONE 15 MG immediate release tablet Commonly known as: ROXICODONE Take 1 tablet (15 mg total) by mouth every 4 (four) hours as needed for up to 15 days for pain (moderate to severe pain).   pantoprazole 40 MG tablet Commonly known as: PROTONIX Take 1 tablet (40 mg total) by mouth daily.   promethazine 12.5 MG tablet Commonly known as: PHENERGAN Take 1 tablet (12.5 mg total) by mouth every 4 (four) hours as needed for nausea or vomiting.   tiZANidine 2 MG tablet Commonly known as: ZANAFLEX Take 1 tablet (2 mg total) by mouth every 8 (eight) hours as needed for muscle spasms.      Allergies  Allergen  Reactions  . Penicillins Swelling and Rash    Has patient had a PCN reaction causing immediate rash, facial/tongue/throat swelling, SOB or lightheadedness with hypotension: Yes Has patient had a PCN reaction causing severe rash involving mucus membranes or skin necrosis: Yes Has patient had a PCN reaction that required hospitalization No Has patient had a PCN reaction occurring within the last 10 years: no If all of the above answers are "NO", then may proceed with Cephalosporin use.    Follow-up Information    Vevelyn Francois, NP. Schedule an appointment as soon as possible for a visit in 1 week(s).   Specialty: Adult Health Nurse Practitioner Contact information: [address] [place] [address] [phone]           Significant Diagnostic Studies: No results found.  Signed:  Angelica Chessman MD, [place], Karilyn Cota, [place]   03/20/2020, 3:34 PM

## 2020-03-20 NOTE — Telephone Encounter (Signed)
Patient called requesting to come to the day hospital for sickle cell pain. Patient reports, should, left hip and right leg pain rated 10/10. Reports taking Oxycodone at 6:00 am with little relief. COVID-19 screening done and patient denies all symptoms and exposures. Denies fever, chest pain, nausea, vomiting, diarrhea and abdominal pain. Admits to having transportation without driving self. Dr. Hyman Hopes notified. Patient can come to the day hospital for pain management. Patient advised and expresses an understanding.

## 2020-03-20 NOTE — H&P (Signed)
[facility] History and Physical  [name] DJS:[id number] DOB: [birth year] DOA: 03/20/2020  PCP: Vevelyn Francois, NP   Chief Complaint: No chief complaint on file.   HPI: Hannah Hawkins is a 34 y.o. female with history of sickle cell disease, type [place], chronic pain syndrome, opiate dependence and tolerance, cocaine use disorder, and anemia of chronic disease who presented to the day hospital this morning with major complaint of generalized body pain typical of her sickle cell pain crisis.  Patient attributes this pain to change in weather.  She says her pain intensity is about 10/10, not relieved by her Home pain medications.  Her pain is characterized as throbbing, achy and constant.  She denies any fever, headache, chest pain, cough, shortness of breath, urinary symptoms, nausea, vomiting or diarrhea.  She denies any sick contacts, recent travel or exposure to COVID-19.  Systemic Review: General: The patient denies anorexia, fever, weight loss Cardiac: Denies chest pain, syncope, palpitations, pedal edema  Respiratory: Denies cough, shortness of breath, wheezing GI: Denies severe indigestion/heartburn, abdominal pain, nausea, vomiting, diarrhea and constipation GU: Denies hematuria, incontinence, dysuria  Musculoskeletal: Denies arthritis  Skin: Denies suspicious skin lesions Neurologic: Denies focal weakness or numbness, change in vision  Past Medical History:  Diagnosis Date  . Sickle cell anemia (HCC)     Past Surgical History:  Procedure Laterality Date  . EYE SURGERY  03/09/2017   left eye  . EYE SURGERY Right 09/2019   right [place]     Allergies  Allergen Reactions  . Penicillins Swelling and Rash    Has patient had a PCN reaction causing immediate rash, facial/tongue/throat swelling, SOB or lightheadedness with hypotension: Yes Has patient had a PCN reaction causing severe rash involving mucus membranes or skin necrosis: Yes Has patient had a PCN  reaction that required hospitalization No Has patient had a PCN reaction occurring within the last 10 years: no If all of the above answers are "NO", then may proceed with Cephalosporin use.     Family History  Problem Relation Age of Onset  . Diabetes Father       Prior to Admission medications   Medication Sig Start Date End Date Taking? Authorizing Provider  Cholecalciferol 125 MCG (5000 UT) capsule Take 1 capsule (5,000 Units total) by mouth daily. 02/22/20 05/22/20  Vevelyn Francois, NP  citalopram (CELEXA) 10 MG tablet Take 1 tablet (10 mg total) by mouth daily for 14 days, THEN 2 tablets (20 mg total) daily. 02/22/20 04/06/20  Vevelyn Francois, NP  ergocalciferol (VITAMIN D2) 1.25 MG (50000 UT) capsule Take 1 capsule (50,000 Units total) by mouth once a week. Patient taking differently: Take 50,000 Units by mouth every Monday.  05/10/19   Lanae Boast, FNP  hydroxyurea (HYDREA) 500 MG capsule TAKE 2 CAPSULES(1000 MG) BY MOUTH DAILY Patient taking differently: Take 1,000 mg by mouth daily.  10/01/19   Tresa Garter, MD  ibuprofen (ADVIL) 800 MG tablet TAKE 1 TABLET(800 MG) BY MOUTH EVERY 8 HOURS AS NEEDED FOR MODERATE PAIN Patient taking differently: Take 800 mg by mouth every 8 (eight) hours as needed for moderate pain.  11/05/19   Tresa Garter, MD  levocetirizine (XYZAL) 5 MG tablet Take 5 mg by mouth every evening.    [provider]  lidocaine (LIDODERM) 5 % Place 1 patch onto the skin daily. Remove & Discard patch within 12 hours or as directed by MD Patient taking differently: Place 1 patch onto the skin  daily as needed (pain). Remove & Discard patch within 12 hours or as directed by MD 01/23/20   Vevelyn Francois, NP  montelukast (SINGULAIR) 10 MG tablet Take 1 tablet (10 mg total) by mouth at bedtime. 01/23/20   Vevelyn Francois, NP  morphine (MS CONTIN) 30 MG 12 hr tablet Take 1 tablet (30 mg total) by mouth every 12 (twelve) hours. Must last 30 days. 03/23/20 04/22/20   Vevelyn Francois, NP  NARCAN 4 MG/0.1ML LIQD nasal spray kit Place 4 mg into the nose once as needed (overdose).  12/22/17   [provider]  ondansetron (ZOFRAN) 4 MG tablet Take 4 mg by mouth every 8 (eight) hours as needed for nausea or vomiting.    [provider]  oxyCODONE (ROXICODONE) 15 MG immediate release tablet Take 1 tablet (15 mg total) by mouth every 4 (four) hours as needed for up to 15 days for pain (moderate to severe pain). 03/15/20 03/30/20  Vevelyn Francois, NP  pantoprazole (PROTONIX) 40 MG tablet Take 1 tablet (40 mg total) by mouth daily. 05/10/19   Lanae Boast, FNP  promethazine (PHENERGAN) 12.5 MG tablet Take 1 tablet (12.5 mg total) by mouth every 4 (four) hours as needed for nausea or vomiting. 02/25/20   Vevelyn Francois, NP  tiZANidine (ZANAFLEX) 2 MG tablet Take 1 tablet (2 mg total) by mouth every 8 (eight) hours as needed for muscle spasms. 01/23/20 03/23/20  Vevelyn Francois, NP     Physical Exam: There were no vitals filed for this visit.  General: Alert, awake, afebrile, anicteric, not in obvious distress HEENT: Normocephalic and Atraumatic, Mucous membranes pink                PERRLA; EOM intact; No scleral icterus,                 Nares: Patent, Oropharynx: Clear, Fair Dentition                 Neck: FROM, no cervical lymphadenopathy, thyromegaly, carotid bruit or JVD;  CHEST WALL: No tenderness  CHEST: Normal respiration, clear to auscultation bilaterally  HEART: Regular rate and rhythm; no murmurs rubs or gallops  BACK: No kyphosis or scoliosis; no CVA tenderness  ABDOMEN: Positive Bowel Sounds, soft, non-tender; no masses, no organomegaly EXTREMITIES: No cyanosis, clubbing, or edema SKIN:  no rash or ulceration  CNS: Alert and Oriented x 4, Nonfocal exam, CN 2-12 intact  Labs on Admission:  Basic Metabolic Panel: No results for input(s): NA, K, CL, CO2, GLUCOSE, BUN, CREATININE, CALCIUM, MG, PHOS in the last 168 hours. Liver Function  Tests: No results for input(s): AST, ALT, ALKPHOS, BILITOT, PROT, ALBUMIN in the last 168 hours. No results for input(s): LIPASE, AMYLASE in the last 168 hours. No results for input(s): AMMONIA in the last 168 hours. CBC: No results for input(s): WBC, NEUTROABS, HGB, HCT, MCV, PLT in the last 168 hours. Cardiac Enzymes: No results for input(s): CKTOTAL, CKMB, CKMBINDEX, TROPONINI in the last 168 hours.  BNP (last 3 results) Recent Labs    03/07/20 1532  BNP 27.4    ProBNP (last 3 results) No results for input(s): PROBNP in the last 8760 hours.  CBG: No results for input(s): GLUCAP in the last 168 hours.   Assessment/Plan Active Problems:   Sickle cell anemia with crisis ([place])   Ad[facility] to the Day Hospital  IVF .45% Saline @ 125 mls/hour  Weight based Dilaudid PCA started within 30 minutes of  admission  IV Toradol 15 mg Q 6 H  Monitor vitals very closely, Re-evaluate pain scale every hour  2 L of Oxygen by [place]  Lidocaine patch  Patient will be re-evaluated for pain in the context of function and relationship to baseline as care progresses.  If no significant relieve from pain (remains above 5/10) will transfer patient to inpatient services for further evaluation and management  Code Status: Full  Family Communication: None  DVT Prophylaxis: Ambulate as tolerated   Time spent: 35 Minutes  Angelica Chessman, MD, MHA, FACP, FAAP, CPE  If 7PM-7AM, please contact night-coverage [email or web address] 03/20/2020, 9:41 AM

## 2020-03-27 ENCOUNTER — Other Ambulatory Visit: Payer: Self-pay

## 2020-03-27 ENCOUNTER — Other Ambulatory Visit: Payer: Self-pay | Admitting: Nurse Practitioner

## 2020-03-27 ENCOUNTER — Telehealth: Payer: Self-pay

## 2020-03-27 DIAGNOSIS — G894 Chronic pain syndrome: Secondary | ICD-10-CM

## 2020-03-27 DIAGNOSIS — D5720 Sickle-cell/Hb-C disease without crisis: Secondary | ICD-10-CM

## 2020-03-27 MED ORDER — OXYCODONE HCL 15 MG PO TABS: 15.0000 mg | ORAL_TABLET | ORAL | 0 refills | Status: DC | PRN
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
## 2020-03-27 NOTE — Telephone Encounter (Signed)
Refill request for oxycodone. Please advise.  

## 2020-04-07 ENCOUNTER — Ambulatory Visit (INDEPENDENT_AMBULATORY_CARE_PROVIDER_SITE_OTHER): Payer: Medicaid Other | Admitting: Nurse Practitioner

## 2020-04-07 ENCOUNTER — Encounter: Payer: Self-pay | Admitting: Nurse Practitioner

## 2020-04-07 DIAGNOSIS — R059 Cough, unspecified: Secondary | ICD-10-CM

## 2020-04-07 DIAGNOSIS — R05 Cough: Secondary | ICD-10-CM

## 2020-04-07 MED ORDER — GUAIFENESIN-CODEINE 100-10 MG/5ML PO SOLN: 5.0000 mL | Freq: Three times a day (TID) | ORAL | 0 refills | Status: DC | PRN

## 2020-04-07 NOTE — Patient Instructions (Signed)

## 2020-04-07 NOTE — Progress Notes (Signed)
Virtual Visit via Telephone Note  I connected with Hannah Hawkins on 04/07/20 at  2:40 PM EDT by telephone and verified that I am speaking with the correct person using two identifiers.   I discussed the limitations, risks, security and privacy concerns of performing an evaluation and management service by telephone and the availability of in person appointments. I also discussed with the patient that there may be a patient responsible charge related to this service. The patient expressed understanding and agreed to proceed.   History of Present Illness:  Cough Patient complains of cough. Symptoms began 1 day ago. Symptoms have been unchanged since that time.The cough is hoarse and nonproductive and is aggravated by  pollen and laughing. Associated symptoms include: denies any additional symptoms. Patient does not have new pets. She admit that she was eating out a seafood restaurant. Patient does not have a history of asthma. Patient does  have a history of environmental allergens. Patient has not traveled recently. Patient does not have a history of smoking. Patient has had a previous chest x-ray. Patient has not had a PPD done.  Observations/Objective: Patient is able to speak in full sentences without shortness of breath  Assessment and Plan: Cough  Follow Up Instructions:  Plan of Care  Pharmacotherapy (Medications Ordered): Meds ordered this encounter  Medications  . guaiFENesin-codeine 100-10 MG/5ML syrup    Sig: Take 5 mLs by mouth 3 (three) times daily as needed for cough.    Dispense:  118 mL    Refill:  0    Order Specific Question:   Supervising Provider    Answer:   Quentin Angst [[id number]]   New Prescriptions   GUAIFENESIN-CODEINE 100-10 MG/5ML SYRUP    Take 5 mLs by mouth 3 (three) times daily as needed for cough.  Patient instructions provided during this appointment: Patient Instructions  Cough, Adult A cough helps to clear your throat and lungs. A cough  may be a sign of an illness or another medical condition. An acute cough may only last 2-3 weeks, while a chronic cough may last 8 or more weeks. Many things can cause a cough. They include:  Germs (viruses or bacteria) that attack the airway.  Breathing in things that bother (irritate) your lungs.  Allergies.  Asthma.  Mucus that runs down the back of your throat (postnasal drip).  Smoking.  Acid backing up from the stomach into the tube that moves food from the mouth to the stomach (gastroesophageal reflux).  Some medicines.  Lung problems.  Other medical conditions, such as heart failure or a blood clot in the lung (pulmonary embolism). Follow these instructions at home: Medicines  Take over-the-counter and prescription medicines only as told by your doctor.  Talk with your doctor before you take medicines that stop a cough (coughsuppressants). Lifestyle   Do not smoke, and try not to be around smoke. Do not use any products that contain nicotine or tobacco, such as cigarettes, e-cigarettes, and chewing tobacco. If you need help quitting, ask your doctor.  Drink enough fluid to keep your pee (urine) pale yellow.  Avoid caffeine.  Do not drink alcohol if your doctor tells you not to drink. General instructions   Watch for any changes in your cough. Tell your doctor about them.  Always cover your mouth when you cough.  Stay away from things that make you cough, such as perfume, candles, campfire smoke, or cleaning products.  If the air is dry, use a cool mist vaporizer  or humidifier in your home.  If your cough is worse at night, try using extra pillows to raise your head up higher while you sleep.  Rest as needed.  Keep all follow-up visits as told by your doctor. This is important. Contact a doctor if:  You have new symptoms.  You cough up pus.  Your cough does not get better after 2-3 weeks, or your cough gets worse.  Cough medicine does not help  your cough and you are not sleeping well.  You have pain that gets worse or pain that is not helped with medicine.  You have a fever.  You are losing weight and you do not know why.  You have night sweats. Get help right away if:  You cough up blood.  You have trouble breathing.  Your heartbeat is very fast. These symptoms may be an emergency. Do not wait to see if the symptoms will go away. Get medical help right away. Call your local emergency services (911 in the [place] Do not drive yourself to the hospital. Summary  A cough helps to clear your throat and lungs. Many things can cause a cough.  Take over-the-counter and prescription medicines only as told by your doctor.  Always cover your mouth when you cough.  Contact a doctor if you have new symptoms or you have a cough that does not get better or gets worse. This information is not intended to replace advice given to you by your health care provider. Make sure you discuss any questions you have with your health care provider. Document Revised: 12/25/2018 Document Reviewed: 12/25/2018 Elsevier Patient Education  [employer].      I discussed the assessment and treatment plan with the patient. The patient was provided an opportunity to ask questions and all were answered. The patient agreed with the plan and demonstrated an understanding of the instructions.   The patient was advised to call back or seek an in-person evaluation if the symptoms worsen or if the condition fails to improve as anticipated.  I provided  8 minutes of non-face-to-face time during this encounter.   Vevelyn Francois, NP

## 2020-04-11 ENCOUNTER — Other Ambulatory Visit: Payer: Self-pay

## 2020-04-11 DIAGNOSIS — G894 Chronic pain syndrome: Secondary | ICD-10-CM

## 2020-04-11 DIAGNOSIS — D5720 Sickle-cell/Hb-C disease without crisis: Secondary | ICD-10-CM

## 2020-04-11 MED ORDER — OXYCODONE HCL 15 MG PO TABS: 15.0000 mg | ORAL_TABLET | ORAL | 0 refills | Status: DC | PRN

## 2020-04-11 NOTE — Telephone Encounter (Signed)
Refill request for oxycodone. Please advise.  

## 2020-04-18 ENCOUNTER — Other Ambulatory Visit: Payer: Self-pay

## 2020-04-18 DIAGNOSIS — D5720 Sickle-cell/Hb-C disease without crisis: Secondary | ICD-10-CM

## 2020-04-18 DIAGNOSIS — G894 Chronic pain syndrome: Secondary | ICD-10-CM

## 2020-04-21 ENCOUNTER — Other Ambulatory Visit: Payer: Self-pay

## 2020-04-21 ENCOUNTER — Telehealth: Payer: Self-pay | Admitting: Nurse Practitioner

## 2020-04-21 ENCOUNTER — Other Ambulatory Visit: Payer: Self-pay | Admitting: Nurse Practitioner

## 2020-04-21 DIAGNOSIS — G894 Chronic pain syndrome: Secondary | ICD-10-CM

## 2020-04-21 DIAGNOSIS — D5720 Sickle-cell/Hb-C disease without crisis: Secondary | ICD-10-CM

## 2020-04-21 MED ORDER — TIZANIDINE HCL 2 MG PO TABS: 2.0000 mg | ORAL_TABLET | Freq: Three times a day (TID) | ORAL | 2 refills | Status: DC | PRN

## 2020-04-21 MED ORDER — MORPHINE SULFATE ER 30 MG PO TBCR: 30.0000 mg | EXTENDED_RELEASE_TABLET | Freq: Two times a day (BID) | ORAL | 0 refills | Status: DC

## 2020-04-21 NOTE — Telephone Encounter (Signed)
Pt called in refill on zanaflex and morphine

## 2020-04-21 NOTE — Telephone Encounter (Signed)
Sent!

## 2020-04-27 ENCOUNTER — Other Ambulatory Visit: Payer: Self-pay

## 2020-04-27 DIAGNOSIS — G894 Chronic pain syndrome: Secondary | ICD-10-CM

## 2020-04-27 DIAGNOSIS — D5720 Sickle-cell/Hb-C disease without crisis: Secondary | ICD-10-CM

## 2020-04-28 ENCOUNTER — Other Ambulatory Visit: Payer: Self-pay | Admitting: Nurse Practitioner

## 2020-04-28 ENCOUNTER — Other Ambulatory Visit: Payer: Self-pay

## 2020-04-28 ENCOUNTER — Telehealth: Payer: Self-pay | Admitting: Nurse Practitioner

## 2020-04-28 DIAGNOSIS — D5720 Sickle-cell/Hb-C disease without crisis: Secondary | ICD-10-CM

## 2020-04-28 DIAGNOSIS — G894 Chronic pain syndrome: Secondary | ICD-10-CM

## 2020-04-28 MED ORDER — CITALOPRAM HYDROBROMIDE 10 MG PO TABS: ORAL_TABLET | ORAL | 0 refills | Status: DC

## 2020-04-28 NOTE — Telephone Encounter (Signed)
Her medication is due tomorrow

## 2020-04-28 NOTE — Telephone Encounter (Signed)
Is this okay to refill? 

## 2020-04-28 NOTE — Telephone Encounter (Signed)
Can you please send in her oxycodone. It is not due until tomorrow . Thanks

## 2020-04-28 NOTE — Telephone Encounter (Signed)
Requst refill Rx Citalopram  10 MG

## 2020-04-28 NOTE — Telephone Encounter (Signed)
Oxycodone Refill please. Thanks

## 2020-04-28 NOTE — Telephone Encounter (Signed)
Can you verify whether she is taking Celexa 10mg  qd or 20 mg qd thanks

## 2020-04-29 ENCOUNTER — Other Ambulatory Visit: Payer: Self-pay | Admitting: Family Medicine

## 2020-04-29 ENCOUNTER — Telehealth: Payer: Self-pay | Admitting: Nurse Practitioner

## 2020-04-29 DIAGNOSIS — D5720 Sickle-cell/Hb-C disease without crisis: Secondary | ICD-10-CM

## 2020-04-29 DIAGNOSIS — G894 Chronic pain syndrome: Secondary | ICD-10-CM

## 2020-04-29 MED ORDER — OXYCODONE HCL 15 MG PO TABS: 15.0000 mg | ORAL_TABLET | ORAL | 0 refills | Status: DC | PRN

## 2020-04-29 NOTE — Telephone Encounter (Signed)
Pt called in refill on oxycodone 15mg 

## 2020-05-01 ENCOUNTER — Other Ambulatory Visit: Payer: Self-pay | Admitting: Nurse Practitioner

## 2020-05-01 NOTE — Telephone Encounter (Signed)
Oxycodone 15 mg request.

## 2020-05-01 NOTE — Telephone Encounter (Signed)
This was refilled on Monday. She has already picked up the Rx thanks

## 2020-05-02 ENCOUNTER — Other Ambulatory Visit: Payer: Self-pay

## 2020-05-02 ENCOUNTER — Ambulatory Visit (INDEPENDENT_AMBULATORY_CARE_PROVIDER_SITE_OTHER): Payer: Medicaid Other | Admitting: Nurse Practitioner

## 2020-05-02 ENCOUNTER — Telehealth: Payer: Self-pay | Admitting: Nurse Practitioner

## 2020-05-02 ENCOUNTER — Encounter: Payer: Self-pay | Admitting: Nurse Practitioner

## 2020-05-02 DIAGNOSIS — J209 Acute bronchitis, unspecified: Secondary | ICD-10-CM | POA: Diagnosis not present

## 2020-05-02 DIAGNOSIS — R062 Wheezing: Secondary | ICD-10-CM

## 2020-05-02 MED ORDER — DM-GUAIFENESIN ER 30-600 MG PO TB12: 1.0000 | ORAL_TABLET | Freq: Two times a day (BID) | ORAL | 0 refills | Status: AC

## 2020-05-02 MED ORDER — BENZONATATE 100 MG PO CAPS: 100.0000 mg | ORAL_CAPSULE | Freq: Three times a day (TID) | ORAL | 2 refills | Status: DC | PRN

## 2020-05-02 MED ORDER — ALBUTEROL SULFATE HFA 108 (90 BASE) MCG/ACT IN AERS
2.0000 | INHALATION_SPRAY | Freq: Four times a day (QID) | RESPIRATORY_TRACT | 0 refills | Status: DC | PRN
Start: 2020-05-02 — End: 2020-09-25

## 2020-05-02 MED ORDER — MONTELUKAST SODIUM 10 MG PO TABS: 10.0000 mg | ORAL_TABLET | Freq: Every day | ORAL | 3 refills | Status: DC

## 2020-05-02 MED ORDER — AZITHROMYCIN 250 MG PO TABS
ORAL_TABLET | ORAL | 0 refills | Status: DC
Start: 2020-05-02 — End: 2020-05-28

## 2020-05-02 NOTE — Progress Notes (Signed)
Virtual Visit via Telephone Note  I connected with Hannah Hawkins on 05/02/20 at  1:30 PM EDT by telephone and verified that I am speaking with the correct person using two identifiers.   I discussed the limitations, risks, security and privacy concerns of performing an evaluation and management service by telephone and the availability of in person appointments. I also discussed with the patient that there may be a patient responsible charge related to this service. The patient expressed understanding and agreed to proceed.   History of Present Illness:   Upper Respiratory Infection Patient complains of follow up on a URI. Symptoms include productive cough with  yellow colored sputum. "She is wheezing in her throat."  Onset of symptoms was several weeks ago, and it had improved but has now returned . Treatment to date: cough suppressants.  She denies headache, dizziness, visual changes, shortness of breath, dyspnea on exertion, chest pain, nausea, vomiting or any edema. She has been taking Nyquil. She admits that her daughter has been coughing. She has allergic rhinitis but has not been taking her Singular for > than one month. She admits that she can not locate it.  Observations/Objective: Audible harsh cough with head congestion. Patient unable to speak in full sentences without coughing.   Assessment and Plan: Bronchitis  Follow Up Instructions: Plan of Care  Pharmacotherapy (Medications Ordered): Meds ordered this encounter  Medications  . azithromycin (ZITHROMAX) 250 MG tablet    Sig: 2 tabs x 1 day then daily x 4 times    Dispense:  6 tablet    Refill:  0    Order Specific Question:   Supervising Provider    Answer:   Tresa Garter [id number]  . dextromethorphan-guaiFENesin (MUCINEX DM) 30-600 MG 12hr tablet    Sig: Take 1 tablet by mouth 2 (two) times daily for 10 days.    Dispense:  20 tablet    Refill:  0    Order Specific Question:   Supervising Provider   Answer:   Tresa Garter [id number]  . benzonatate (TESSALON) 100 MG capsule    Sig: Take 1 capsule (100 mg total) by mouth 3 (three) times daily as needed for cough. Never suck or chew on a benzonatate capsule.    Dispense:  30 capsule    Refill:  2    Do not place medication on "Automatic Refill". Please provide patient with medication counseling and recommendations.    Order Specific Question:   Supervising Provider    Answer:   Tresa Garter [id number]  . montelukast (SINGULAIR) 10 MG tablet    Sig: Take 1 tablet (10 mg total) by mouth at bedtime.    Dispense:  30 tablet    Refill:  3    Order Specific Question:   Supervising Provider    Answer:   Tresa Garter [id number]  . albuterol (VENTOLIN HFA) 108 (90 Base) MCG/ACT inhaler    Sig: Inhale 2 puffs into the lungs every 6 (six) hours as needed for wheezing or shortness of breath.    Dispense:  8 g    Refill:  0    Order Specific Question:   Supervising Provider    Answer:   Tresa Garter [[id number]]   New Prescriptions   ALBUTEROL (VENTOLIN HFA) 108 (90 BASE) MCG/ACT INHALER    Inhale 2 puffs into the lungs every 6 (six) hours as needed for wheezing or shortness of breath.   AZITHROMYCIN (ZITHROMAX) 250 MG TABLET  2 tabs x 1 day then daily x 4 times   BENZONATATE (TESSALON) 100 MG CAPSULE    Take 1 capsule (100 mg total) by mouth 3 (three) times daily as needed for cough. Never suck or chew on a benzonatate capsule.   DEXTROMETHORPHAN-GUAIFENESIN (MUCINEX DM) 30-600 MG 12HR TABLET    Take 1 tablet by mouth 2 (two) times daily for 10 days.   Medications administered today: Hannah Hawkins had no medications administered during this visit. Lab-work, procedure(s), and/or referral(s): No orders of the defined types were placed in this encounter.  Imaging and/or referral(s): None   Future Appointments  Date Time Provider Department Center  05/07/2020 10:40 AM Barbette Merino, NP [facility] None   Note by:  Barbette Merino NP Date: 05/02/2020; Time: 4:03 PM Patient instructions provided during this appointment: Patient Instructions  Acute Bronchitis, Adult  Acute bronchitis is when air tubes in the lungs (bronchi) suddenly get swollen. The condition can make it hard for you to breathe. In adults, acute bronchitis usually goes away within 2 weeks. A cough caused by bronchitis may last up to 3 weeks. Smoking, allergies, and asthma can make the condition worse. What are the causes? This condition is caused by:  Cold and flu viruses. The most common cause of this condition is the virus that causes the common cold.  Bacteria.  Substances that irritate the lungs, including: ? Smoke from cigarettes and other types of tobacco. ? Dust and pollen. ? Fumes from chemicals, gases, or burned fuel. ? Other materials that pollute indoor or outdoor air.  Close contact with someone who has acute bronchitis. What increases the risk? The following factors may make you more likely to develop this condition:  A weak body's defense system. This is also called the immune system.  Any condition that affects your lungs and breathing, such as asthma. What are the signs or symptoms? Symptoms of this condition include:  A cough.  Coughing up clear, yellow, or green mucus.  Wheezing.  Chest congestion.  Shortness of breath.  A fever.  Body aches.  Chills.  A sore throat. How is this treated? Acute bronchitis may go away over time without treatment. Your doctor may recommend:  Drinking more fluids.  Taking a medicine for a fever or cough.  Using a device that gets medicine into your lungs (inhaler).  Using a vaporizer or a humidifier. These are machines that add water or moisture in the air to help with coughing and poor breathing. Follow these instructions at home:  Activity  Get a lot of rest.  Avoid places where there are fumes from chemicals.  Return to your normal activities as  told by your doctor. Ask your doctor what activities are safe for you. Lifestyle  Drink enough fluids to keep your pee (urine) pale yellow.  Do not drink alcohol.  Do not use any products that contain nicotine or tobacco, such as cigarettes, e-cigarettes, and chewing tobacco. If you need help quitting, ask your doctor. Be aware that: ? Your bronchitis will get worse if you smoke or breathe in other people's smoke (secondhand smoke). ? Your lungs will heal faster if you quit smoking. General instructions  Take over-the-counter and prescription medicines only as told by your doctor.  Use an inhaler, cool mist vaporizer, or humidifier as told by your doctor.  Rinse your mouth often with salt water. To make salt water, dissolve -1 tsp (3-6 g) of salt in 1 cup (237 mL) of warm water.  Keep all follow-up visits as told by your doctor. This is important. How is this prevented? To lower your risk of getting this condition again:  Wash your hands often with soap and water. If soap and water are not available, use hand sanitizer.  Avoid contact with people who have cold symptoms.  Try not to touch your mouth, nose, or eyes with your hands.  Make sure to get the flu shot every year. Contact a doctor if:  Your symptoms do not get better in 2 weeks.  You vomit more than once or twice.  You have symptoms of loss of fluid from your body (dehydration). These include: ? Dark urine. ? Dry skin or eyes. ? Increased thirst. ? Headaches. ? Confusion. ? Muscle cramps. Get help right away if:  You cough up blood.  You have chest pain.  You have very bad shortness of breath.  You become dehydrated.  You faint or keep feeling like you are going to faint.  You keep vomiting.  You have a very bad headache.  Your fever or chills get worse. These symptoms may be an emergency. Do not wait to see if the symptoms will go away. Get medical help right away. Call your local emergency  services (911 in the [place] Do not drive yourself to the hospital. Summary  Acute bronchitis is when air tubes in the lungs (bronchi) suddenly get swollen. In adults, acute bronchitis usually goes away within 2 weeks.  Take over-the-counter and prescription medicines only as told by your doctor.  Drink enough fluid to keep your pee (urine) pale yellow.  Contact a doctor if your symptoms do not improve after 2 weeks of treatment.  Get help right away if you cough up blood, faint, or have chest pain or shortness of breath. This information is not intended to replace advice given to you by your health care provider. Make sure you discuss any questions you have with your health care provider. Document Revised: 06/29/2019 Document Reviewed: 06/29/2019 Elsevier Patient Education  2020 [employer].    I discussed the assessment and treatment plan with the patient. The patient was provided an opportunity to ask questions and all were answered. The patient agreed with the plan and demonstrated an understanding of the instructions.   The patient was advised to call back or seek an in-person evaluation if the symptoms worsen or if the condition fails to improve as anticipated.  I provided 9.30 minutes of non-face-to-face time during this encounter.   Barbette Merino, NP

## 2020-05-02 NOTE — Patient Instructions (Signed)

## 2020-05-02 NOTE — Telephone Encounter (Signed)
Done

## 2020-05-07 ENCOUNTER — Ambulatory Visit: Payer: Medicaid Other | Admitting: Nurse Practitioner

## 2020-05-09 NOTE — Telephone Encounter (Signed)
done

## 2020-05-12 ENCOUNTER — Non-Acute Institutional Stay (HOSPITAL_COMMUNITY)
Admission: AD | Admit: 2020-05-12 | Discharge: 2020-05-12 | Disposition: A | Discharge status: Home / Self Care | Payer: Medicaid Other | Source: Ambulatory Visit | Attending: Internal Medicine | Admitting: Internal Medicine

## 2020-05-12 ENCOUNTER — Ambulatory Visit: Payer: Self-pay | Admitting: Nurse Practitioner

## 2020-05-12 ENCOUNTER — Encounter (HOSPITAL_COMMUNITY): Payer: Self-pay | Admitting: Family Medicine

## 2020-05-12 ENCOUNTER — Telehealth (HOSPITAL_COMMUNITY): Payer: Self-pay | Admitting: General Practice

## 2020-05-12 DIAGNOSIS — G894 Chronic pain syndrome: Secondary | ICD-10-CM | POA: Diagnosis not present

## 2020-05-12 DIAGNOSIS — D638 Anemia in other chronic diseases classified elsewhere: Secondary | ICD-10-CM | POA: Insufficient documentation

## 2020-05-12 DIAGNOSIS — D5700 Hb-SS disease with crisis, unspecified: Secondary | ICD-10-CM | POA: Diagnosis present

## 2020-05-12 DIAGNOSIS — Z79891 Long term (current) use of opiate analgesic: Secondary | ICD-10-CM | POA: Insufficient documentation

## 2020-05-12 DIAGNOSIS — Z880 Allergy status to penicillin: Secondary | ICD-10-CM | POA: Diagnosis not present

## 2020-05-12 DIAGNOSIS — Z79899 Other long term (current) drug therapy: Secondary | ICD-10-CM | POA: Insufficient documentation

## 2020-05-12 DIAGNOSIS — Z87891 Personal history of nicotine dependence: Secondary | ICD-10-CM | POA: Diagnosis not present

## 2020-05-12 LAB — CBC WITH DIFFERENTIAL/PLATELET
Abs Immature Granulocytes: 0.04 10*3/uL (ref 0.00–0.07)
Basophils Absolute: 0.1 10*3/uL (ref 0.0–0.1)
Basophils Relative: 1 %
Eosinophils Absolute: 0.5 10*3/uL (ref 0.0–0.5)
Eosinophils Relative: 5 %
HCT: 28.8 % — ABNORMAL LOW (ref 36.0–46.0)
Hemoglobin: 10.4 g/dL — ABNORMAL LOW (ref 12.0–15.0)
Immature Granulocytes: 0 %
Lymphocytes Relative: 45 %
Lymphs Abs: 4.6 10*3/uL — ABNORMAL HIGH (ref 0.7–4.0)
MCH: 28.2 pg (ref 26.0–34.0)
MCHC: 36.1 g/dL — ABNORMAL HIGH (ref 30.0–36.0)
MCV: 78 fL — ABNORMAL LOW (ref 80.0–100.0)
Monocytes Absolute: 0.9 10*3/uL (ref 0.1–1.0)
Monocytes Relative: 9 %
Neutro Abs: 4 10*3/uL (ref 1.7–7.7)
Neutrophils Relative %: 40 %
Platelets: 396 10*3/uL (ref 150–400)
RBC: 3.69 MIL/uL — ABNORMAL LOW (ref 3.87–5.11)
RDW: 13.2 % (ref 11.5–15.5)
WBC: 10.1 10*3/uL (ref 4.0–10.5)
nRBC: 1.7 % — ABNORMAL HIGH (ref 0.0–0.2)

## 2020-05-12 LAB — RAPID URINE DRUG SCREEN, HOSP PERFORMED
Amphetamines: NOT DETECTED
Barbiturates: NOT DETECTED
Benzodiazepines: NOT DETECTED
Cocaine: NOT DETECTED
Opiates: POSITIVE — AB
Tetrahydrocannabinol: NOT DETECTED

## 2020-05-12 LAB — COMPREHENSIVE METABOLIC PANEL
ALT: 18 U/L (ref 0–44)
AST: 25 U/L (ref 15–41)
Albumin: 4 g/dL (ref 3.5–5.0)
Alkaline Phosphatase: 48 U/L (ref 38–126)
Anion gap: 9 (ref 5–15)
BUN: 7 mg/dL (ref 6–20)
CO2: 28 mmol/L (ref 22–32)
Calcium: 8.7 mg/dL — ABNORMAL LOW (ref 8.9–10.3)
Chloride: 103 mmol/L (ref 98–111)
Creatinine, Ser: 0.72 mg/dL (ref 0.44–1.00)
GFR calc Af Amer: >60 mL/min (ref >60)
GFR calc non Af Amer: >60 mL/min (ref >60)
Glucose, Bld: 97 mg/dL (ref 70–99)
Potassium: 3.3 mmol/L — ABNORMAL LOW (ref 3.5–5.1)
Sodium: 140 mmol/L (ref 135–145)
Total Bilirubin: 0.3 mg/dL (ref 0.3–1.2)
Total Protein: 6.8 g/dL (ref 6.5–8.1)

## 2020-05-12 LAB — RETICULOCYTES
Immature Retic Fract: 28.3 % — ABNORMAL HIGH (ref 2.3–15.9)
RBC.: 3.67 MIL/uL — ABNORMAL LOW (ref 3.87–5.11)
Retic Count, Absolute: 172.5 10*3/uL (ref 19.0–186.0)
Retic Ct Pct: 4.7 % — ABNORMAL HIGH (ref 0.4–3.1)

## 2020-05-12 LAB — PREGNANCY, URINE: Preg Test, Ur: NEGATIVE

## 2020-05-12 MED ORDER — DIPHENHYDRAMINE HCL 25 MG PO CAPS: 25.0000 mg | ORAL_CAPSULE | ORAL | Status: DC | PRN

## 2020-05-12 MED ORDER — HYDROMORPHONE 1 MG/ML IV SOLN
INTRAVENOUS | Status: DC
  Administered 2020-05-12: 14 mg via INTRAVENOUS
  Administered 2020-05-12: 30 mg via INTRAVENOUS
  Filled 2020-05-12: qty 30

## 2020-05-12 MED ORDER — SODIUM CHLORIDE 0.45 % IV SOLN: INTRAVENOUS | Status: DC

## 2020-05-12 MED ORDER — KETOROLAC TROMETHAMINE 30 MG/ML IJ SOLN
15.0000 mg | Freq: Once | INTRAMUSCULAR | Status: AC
  Administered 2020-05-12: 15 mg via INTRAVENOUS
  Filled 2020-05-12: qty 1

## 2020-05-12 MED ORDER — ACETAMINOPHEN 500 MG PO TABS
1000.0000 mg | ORAL_TABLET | Freq: Once | ORAL | Status: DC
  Filled 2020-05-12: qty 2

## 2020-05-12 MED ORDER — SODIUM CHLORIDE 0.9% FLUSH: 9.0000 mL | INTRAVENOUS | Status: DC | PRN

## 2020-05-12 MED ORDER — NALOXONE HCL 0.4 MG/ML IJ SOLN: 0.4000 mg | INTRAMUSCULAR | Status: DC | PRN

## 2020-05-12 MED ORDER — ONDANSETRON HCL 4 MG/2ML IJ SOLN
4.0000 mg | Freq: Four times a day (QID) | INTRAMUSCULAR | Status: DC | PRN
  Filled 2020-05-12: qty 2

## 2020-05-12 NOTE — H&P (Signed)
[facility] History and Physical   Date: 05/12/2020  Patient name: Hannah Hawkins Medical record number: [id number] Date of birth: [birth year] Age: 34 y.o. Gender: female PCP: Vevelyn Francois, NP  Attending physician: Tresa Garter, MD  Chief Complaint: Sickle cell pain  History of Present Illness: Hannah Hawkins is a 33 year old female with a medical history significant for sickle cell disease, chronic pain syndrome, opiate dependence and tolerance, history of polysubstance abuse, and history of anemia of chronic disease presents complaining of bilateral lower extremity pain that is consistent with her typical pain crisis.  Patient is dates that she awakened yesterday morning with increased pain primarily to right lower extremity around the ankle.  She also endorses bilateral ankle swelling.  She attributes pain crisis to being out of short acting oxycodone.  Medication is not due to be filled until 05/14/2020.  Her current pain intensity is 9/10 characterized as constant and aching.  She last had MS Contin this a.m. without sustained relief.  Patient denies fever, chills, sore throat, urinary symptoms, nausea, vomiting, or diarrhea.  She has had no sick contacts, recent travel, or exposure to COVID-19.  Meds: Medications Prior to Admission  Medication Sig Dispense Refill Last Dose  . albuterol (VENTOLIN HFA) 108 (90 Base) MCG/ACT inhaler Inhale 2 puffs into the lungs every 6 (six) hours as needed for wheezing or shortness of breath. 8 g 0   . azithromycin (ZITHROMAX) 250 MG tablet 2 tabs x 1 day then daily x 4 times 6 tablet 0   . benzonatate (TESSALON) 100 MG capsule Take 1 capsule (100 mg total) by mouth 3 (three) times daily as needed for cough. Never suck or chew on a benzonatate capsule. 30 capsule 2   . Cholecalciferol 125 MCG (5000 UT) capsule Take 1 capsule (5,000 Units total) by mouth daily. (Patient not taking: Reported on 05/02/2020) 90 capsule 0   .  citalopram (CELEXA) 10 MG tablet Take 1 tablet (10 mg total) by mouth daily for 14 days, THEN 2 tablets (20 mg total) daily. 90 tablet 0   . dextromethorphan-guaiFENesin (MUCINEX DM) 30-600 MG 12hr tablet Take 1 tablet by mouth 2 (two) times daily for 10 days. 20 tablet 0   . ergocalciferol (VITAMIN D2) 1.25 MG (50000 UT) capsule Take 1 capsule (50,000 Units total) by mouth once a week. (Patient not taking: Reported on 05/02/2020) 12 capsule 3   . guaiFENesin-codeine 100-10 MG/5ML syrup Take 5 mLs by mouth 3 (three) times daily as needed for cough. (Patient not taking: Reported on 05/02/2020) 118 mL 0   . hydroxyurea (HYDREA) 500 MG capsule TAKE 2 CAPSULES(1000 MG) BY MOUTH DAILY (Patient taking differently: Take 1,000 mg by mouth daily. ) 60 capsule 2   . levocetirizine (XYZAL) 5 MG tablet Take 5 mg by mouth every evening.     . lidocaine (LIDODERM) 5 % Place 1 patch onto the skin daily. Remove & Discard patch within 12 hours or as directed by MD 30 patch 1   . montelukast (SINGULAIR) 10 MG tablet Take 1 tablet (10 mg total) by mouth at bedtime. 30 tablet 3   . morphine (MS CONTIN) 30 MG 12 hr tablet Take 1 tablet (30 mg total) by mouth every 12 (twelve) hours. Must last 30 days. 60 tablet 0   . NARCAN 4 MG/0.1ML LIQD nasal spray kit Place 4 mg into the nose once as needed (overdose).   0   . ondansetron (ZOFRAN) 4 MG tablet Take 4 mg  by mouth every 8 (eight) hours as needed for nausea or vomiting.     Marland Kitchen oxyCODONE (ROXICODONE) 15 MG immediate release tablet Take 1 tablet (15 mg total) by mouth every 4 (four) hours as needed for up to 15 days for pain (moderate to severe pain). 60 tablet 0   . pantoprazole (PROTONIX) 40 MG tablet Take 1 tablet (40 mg total) by mouth daily. (Patient not taking: Reported on 05/02/2020) 30 tablet 3   . promethazine (PHENERGAN) 12.5 MG tablet Take 1 tablet (12.5 mg total) by mouth every 4 (four) hours as needed for nausea or vomiting. 30 tablet 0   . tiZANidine (ZANAFLEX) 2  MG tablet Take 1 tablet (2 mg total) by mouth every 8 (eight) hours as needed for muscle spasms. 45 tablet 2     Allergies: Penicillins Past Medical History:  Diagnosis Date  . Sickle cell anemia (HCC)    Past Surgical History:  Procedure Laterality Date  . EYE SURGERY  03/09/2017   left eye  . EYE SURGERY Right 09/2019   right hemerage    Family History  Problem Relation Age of Onset  . Diabetes Father    Social History   Socioeconomic History  . Marital status: Single    Spouse name: Not on file  . Number of children: Not on file  . Years of education: Not on file  . Highest education level: Not on file  Occupational History  . Not on file  Tobacco Use  . Smoking status: Former Smoker    Types: Cigarettes  . Smokeless tobacco: Never Used  Substance and Sexual Activity  . Alcohol use: Not Currently  . Drug use: No  . Sexual activity: Yes  Other Topics Concern  . Not on file  Social History Narrative  . Not on file   Social Determinants of Health   Financial Resource Strain:   . Difficulty of Paying Living Expenses:   Food Insecurity:   . Worried About Charity fundraiser in the Last Year:   . Arboriculturist in the Last Year:   Transportation Needs:   . Film/video editor (Medical):   Marland Kitchen Lack of Transportation (Non-Medical):   Physical Activity:   . Days of Exercise per Week:   . Minutes of Exercise per Session:   Stress:   . Feeling of Stress :   Social Connections:   . Frequency of Communication with Friends and Family:   . Frequency of Social Gatherings with Friends and Family:   . Attends Religious Services:   . Active Member of Clubs or Organizations:   . Attends Archivist Meetings:   Marland Kitchen Marital Status:   Intimate Partner Violence:   . Fear of Current or Ex-Partner:   . Emotionally Abused:   Marland Kitchen Physically Abused:   . Sexually Abused:     Review of Systems  Constitutional: Negative for chills and fever.  HENT: Negative.    Eyes: Negative.   Respiratory: Negative.   Cardiovascular: Negative.   Gastrointestinal: Negative.   Genitourinary: Negative.   Musculoskeletal: Positive for joint pain.  Skin: Negative.   Neurological: Negative.   Psychiatric/Behavioral: Negative.     Physical Exam: There were no vitals taken for this visit. Physical Exam Constitutional:      Appearance: Normal appearance.  Eyes:     Pupils: Pupils are equal, round, and reactive to light.  Cardiovascular:     Rate and Rhythm: Normal rate.     Pulses:  Normal pulses.  Pulmonary:     Effort: Pulmonary effort is normal.  Abdominal:     General: Abdomen is flat. Bowel sounds are normal.  Skin:    General: Skin is warm.  Neurological:     General: No focal deficit present.     Mental Status: She is alert. Mental status is at baseline.  Psychiatric:        Mood and Affect: Mood normal.        Thought Content: Thought content normal.        Judgment: Judgment normal.      Lab results: No results found for this or any previous visit (from the past 24 hour(s)).  Imaging results:  No results found.   Assessment & Plan: Patient admitted to sickle cell day infusion center for management of pain crisis.  Patient is opiate tolerant Initiate IV dilaudid PCA. Settings of 0.5 mg, 10 minute lockout, and 3 mg per hour IV fluids, 0.45% saline at 100 ml/hr Toradol 15 mg IV times one dose Tylenol 1000 mg by mouth times one dose Review CBC with differential, complete metabolic panel, and reticulocytes as results become available.  Pain intensity will be reevaluated in context of functioning and relationship to baseline as patient's care progresses If pain intensity remains elevated and/or sudden change in hemodynamic stability transition to inpatient services for higher level of care.   Donia Pounds  APRN, MSN, FNP-C Patient [facility] [address] [place], [place]  [address] [phone]   05/12/2020, 9:48 AM

## 2020-05-12 NOTE — Discharge Summary (Signed)
[facility] Discharge Summary   Patient ID: Hannah Hawkins MRN: [id number] DOB/AGE: 36-Mar-1987 36 y.o.  Admit date: 05/12/2020 Discharge date: 05/12/2020  Primary Care Physician:  Vevelyn Francois, NP  Admission Diagnoses:  Active Problems:   Sickle cell pain crisis [facility])   Discharge Medications:  Allergies as of 05/12/2020      Reactions   Penicillins Swelling, Rash   Has patient had a PCN reaction causing immediate rash, facial/tongue/throat swelling, SOB or lightheadedness with hypotension: Yes Has patient had a PCN reaction causing severe rash involving mucus membranes or skin necrosis: Yes Has patient had a PCN reaction that required hospitalization No Has patient had a PCN reaction occurring within the last 10 years: no If all of the above answers are "NO", then may proceed with Cephalosporin use.      Medication List    TAKE these medications   albuterol 108 (90 Base) MCG/ACT inhaler Commonly known as: VENTOLIN HFA Inhale 2 puffs into the lungs every 6 (six) hours as needed for wheezing or shortness of breath.   azithromycin 250 MG tablet Commonly known as: ZITHROMAX 2 tabs x 1 day then daily x 4 times   benzonatate 100 MG capsule Commonly known as: TESSALON Take 1 capsule (100 mg total) by mouth 3 (three) times daily as needed for cough. Never suck or chew on a benzonatate capsule.   Cholecalciferol 125 MCG (5000 UT) capsule Take 1 capsule (5,000 Units total) by mouth daily.   citalopram 10 MG tablet Commonly known as: CeleXA Take 1 tablet (10 mg total) by mouth daily for 14 days, THEN 2 tablets (20 mg total) daily. Start taking on: Apr 28, 2020   dextromethorphan-guaiFENesin 30-600 MG 12hr tablet Commonly known as: MUCINEX DM Take 1 tablet by mouth 2 (two) times daily for 10 days.   ergocalciferol 1.25 MG (50000 UT) capsule Commonly known as: VITAMIN D2 Take 1 capsule (50,000 Units total) by mouth once a week.   guaiFENesin-codeine  100-10 MG/5ML syrup Take 5 mLs by mouth 3 (three) times daily as needed for cough.   hydroxyurea 500 MG capsule Commonly known as: HYDREA TAKE 2 CAPSULES(1000 MG) BY MOUTH DAILY What changed: See the new instructions.   levocetirizine 5 MG tablet Commonly known as: XYZAL Take 5 mg by mouth every evening.   lidocaine 5 % Commonly known as: Lidoderm Place 1 patch onto the skin daily. Remove & Discard patch within 12 hours or as directed by MD   montelukast 10 MG tablet Commonly known as: SINGULAIR Take 1 tablet (10 mg total) by mouth at bedtime.   morphine 30 MG 12 hr tablet Commonly known as: MS CONTIN Take 1 tablet (30 mg total) by mouth every 12 (twelve) hours. Must last 30 days.   Narcan 4 MG/0.1ML Liqd nasal spray kit Generic drug: naloxone Place 4 mg into the nose once as needed (overdose).   ondansetron 4 MG tablet Commonly known as: ZOFRAN Take 4 mg by mouth every 8 (eight) hours as needed for nausea or vomiting.   oxyCODONE 15 MG immediate release tablet Commonly known as: ROXICODONE Take 1 tablet (15 mg total) by mouth every 4 (four) hours as needed for up to 15 days for pain (moderate to severe pain).   pantoprazole 40 MG tablet Commonly known as: PROTONIX Take 1 tablet (40 mg total) by mouth daily.   promethazine 12.5 MG tablet Commonly known as: PHENERGAN Take 1 tablet (12.5 mg total) by mouth every 4 (four) hours as  needed for nausea or vomiting.   tiZANidine 2 MG tablet Commonly known as: ZANAFLEX Take 1 tablet (2 mg total) by mouth every 8 (eight) hours as needed for muscle spasms.        Consults:  None  Significant Diagnostic Studies:  No results found.  History of present illness:  Hannah Hawkins is a 34 year old female with a medical history significant for sickle cell disease, chronic pain syndrome, opiate dependence and tolerance, history of polysubstance abuse, and history of anemia of chronic disease presents complaining of bilateral  lower extremity pain that is consistent with her typical pain crisis.  Patient is dates that she awakened yesterday morning with increased pain primarily to right lower extremity around the ankle.  She also endorses bilateral ankle swelling.  She attributes pain crisis to being out of short acting oxycodone.  Medication is not due to be filled until 05/14/2020.  Her current pain intensity is 9/10 characterized as constant and aching.  She last had MS Contin this a.m. without sustained relief.  Patient denies fever, chills, sore throat, urinary symptoms, nausea, vomiting, or diarrhea.  She has had no sick contacts, recent travel, or exposure to COVID-19.  [facility] Course: Patient admitted to sickle cell day infusion center for management of pain crisis.  Hemoglobin is 10.4, consistent with patient's baseline.  There is no clinical indication for blood transfusion on today.  Patient's potassium is around 3.3, patient asked to increase dietary intake of potassium.  Pain managed with IV Dilaudid via PCA with settings of 0.5 mg, 10-minute lockout, and 3 mg/h. IV fluids, 0.45% saline at 100 mL/h Toradol 15 mg IV x1 Tylenol 1000 mg by mouth x1 Pain intensity decreased to 6/10.  Patient does not warrant admission on today. Patient advised to follow-up with PCP on 05/14/2020 as scheduled. She is alert, oriented, and ambulating without assistance.  She will discharge home in a hemodynamically stable condition. Discharge instructions:  Resume all home medications.   Follow up with PCP as previously  scheduled.   Discussed the importance of drinking 64 ounces of water daily, dehydration of red blood cells may lead further sickling.   Avoid all stressors that precipitate sickle cell pain crisis.     The patient was given clear instructions to go to ER or return to medical center if symptoms do not improve, worsen or new problems develop.     Physical Exam at Discharge:  BP 132/85 (BP  Location: Left Arm)   Pulse 60   Temp 98.1 F (36.7 C) (Temporal)   Resp 15   LMP 04/24/2020   SpO2 100%  Physical Exam Constitutional:      Appearance: Normal appearance.  HENT:     Mouth/Throat:     Mouth: Mucous membranes are moist.  Eyes:     Pupils: Pupils are equal, round, and reactive to light.  Cardiovascular:     Rate and Rhythm: Normal rate and regular rhythm.     Pulses: Normal pulses.     Heart sounds: Normal heart sounds.  Pulmonary:     Effort: Pulmonary effort is normal.     Breath sounds: Normal breath sounds.  Abdominal:     General: Abdomen is flat. Bowel sounds are normal.  Neurological:     General: No focal deficit present.     Mental Status: She is alert. Mental status is at baseline.  Psychiatric:        Mood and Affect: Mood normal.  Behavior: Behavior normal.        Thought Content: Thought content normal.        Judgment: Judgment normal.      Disposition at Discharge: Discharge disposition: 01-Home or Self Care       Discharge Orders: Discharge Instructions    Discharge patient   Complete by: As directed    Discharge disposition: 01-Home or Self Care   Discharge patient date: 05/12/2020      Condition at Discharge:   Stable  Time spent on Discharge:  Greater than 30 minutes.  Signed: Donia Pounds  APRN, MSN, FNP-C Patient [facility] [address] [place], [place] [address] [phone]  05/12/2020, 4:18 PM

## 2020-05-12 NOTE — Telephone Encounter (Signed)
Patient called, requesting to come to the day hospital due to pain in the right hip, waist and right ankle rated at 10/10. Denied chest pain, fever, diarrhea, abdominal pain, nausea/vomitting. Screened negative for Covid-19 symptoms. Will need transportation at discharge. Last took  15 mg of oxycodone and 30 mg of morphine at 00:00 today. Per provider, patient can come to the day hospital for treatment. Patient notified, verbalized understanding.

## 2020-05-12 NOTE — Discharge Instructions (Signed)
Sickle Cell Anemia, Adult  Sickle cell anemia is a condition where your red blood cells are shaped like sickles. Red blood cells carry oxygen through the body. Sickle-shaped cells do not live as long as normal red blood cells. They also clump together and block blood from flowing through the blood vessels. This prevents the body from getting enough oxygen. Sickle cell anemia causes organ damage and pain. It also increases the risk of infection. Follow these instructions at home: Medicines  Take over-the-counter and prescription medicines only as told by your doctor.  If you were prescribed an antibiotic medicine, take it as told by your doctor. Do not stop taking the antibiotic even if you start to feel better.  If you develop a fever, do not take medicines to lower the fever right away. Tell your doctor about the fever. Managing pain, stiffness, and swelling  Try these methods to help with pain: ? Use a heating pad. ? Take a warm bath. ? Distract yourself, such as by watching TV. Eating and drinking  Drink enough fluid to keep your pee (urine) clear or pale yellow. Drink more in hot weather and during exercise.  Limit or avoid alcohol.  Eat a healthy diet. Eat plenty of fruits, vegetables, whole grains, and lean protein.  Take vitamins and supplements as told by your doctor. Traveling  When traveling, keep these with you: ? Your medical information. ? The names of your doctors. ? Your medicines.  If you need to take an airplane, talk to your doctor first. Activity  Rest often.  Avoid exercises that make your heart beat much faster, such as jogging. General instructions  Do not use products that have nicotine or tobacco, such as cigarettes and e-cigarettes. If you need help quitting, ask your doctor.  Consider wearing a medical alert bracelet.  Avoid being in high places (high altitudes), such as mountains.  Avoid very hot or cold temperatures.  Avoid places where the  temperature changes a lot.  Keep all follow-up visits as told by your doctor. This is important. Contact a doctor if:  A joint hurts.  Your feet or hands hurt or swell.  You feel tired (fatigued). Get help right away if:  You have symptoms of infection. These include: ? Fever. ? Chills. ? Being very tired. ? Irritability. ? Poor eating. ? Throwing up (vomiting).  You feel dizzy or faint.  You have new stomach pain, especially on the left side.  You have a an erection (priapism) that lasts more than 4 hours.  You have numbness in your arms or legs.  You have a hard time moving your arms or legs.  You have trouble talking.  You have pain that does not go away when you take medicine.  You are short of breath.  You are breathing fast.  You have a long-term cough.  You have pain in your chest.  You have a bad headache.  You have a stiff neck.  Your stomach looks bloated even though you did not eat much.  Your skin is pale.  You suddenly cannot see well. Summary  Sickle cell anemia is a condition where your red blood cells are shaped like sickles.  Follow your doctor's advice on ways to manage pain, food to eat, activities to do, and steps to take for safe travel.  Get medical help right away if you have any signs of infection, such as a fever. This information is not intended to replace advice given to you by   your health care provider. Make sure you discuss any questions you have with your health care provider. Document Revised: 03/30/2019 Document Reviewed: 01/11/2017 Elsevier Patient Education  2020 [employer]  

## 2020-05-12 NOTE — Progress Notes (Signed)
Patient admitted to the day infusion hospital for sickle cell pain. Initially, patient reported right leg and hip pain rated 10/10. For pain management, patient placed on Dilaudid PCA, given 15 mg Toradol, 1000 mg Tylenol and hydrated with IV fluids. At discharge, patient rated pain at 5/10. Vital signs stable. Discharge instructions given. Patient alert, oriented and ambulatory at discharge.

## 2020-05-13 ENCOUNTER — Other Ambulatory Visit: Payer: Self-pay

## 2020-05-13 ENCOUNTER — Inpatient Hospital Stay (HOSPITAL_COMMUNITY)
Admission: EM | Admit: 2020-05-13 | Discharge: 2020-05-16 | DRG: 812 | Disposition: A | Discharge status: Home / Self Care | Payer: Medicaid Other | Attending: Internal Medicine | Admitting: Internal Medicine

## 2020-05-13 ENCOUNTER — Encounter (HOSPITAL_COMMUNITY): Payer: Self-pay

## 2020-05-13 DIAGNOSIS — F1120 Opioid dependence, uncomplicated: Secondary | ICD-10-CM | POA: Diagnosis present

## 2020-05-13 DIAGNOSIS — Z87891 Personal history of nicotine dependence: Secondary | ICD-10-CM

## 2020-05-13 DIAGNOSIS — G894 Chronic pain syndrome: Secondary | ICD-10-CM | POA: Diagnosis present

## 2020-05-13 DIAGNOSIS — Z79899 Other long term (current) drug therapy: Secondary | ICD-10-CM

## 2020-05-13 DIAGNOSIS — D571 Sickle-cell disease without crisis: Secondary | ICD-10-CM

## 2020-05-13 DIAGNOSIS — Z20822 Contact with and (suspected) exposure to covid-19: Secondary | ICD-10-CM | POA: Diagnosis present

## 2020-05-13 DIAGNOSIS — Z833 Family history of diabetes mellitus: Secondary | ICD-10-CM

## 2020-05-13 DIAGNOSIS — E875 Hyperkalemia: Secondary | ICD-10-CM | POA: Diagnosis present

## 2020-05-13 DIAGNOSIS — K219 Gastro-esophageal reflux disease without esophagitis: Secondary | ICD-10-CM | POA: Diagnosis present

## 2020-05-13 DIAGNOSIS — Z880 Allergy status to penicillin: Secondary | ICD-10-CM

## 2020-05-13 DIAGNOSIS — D5700 Hb-SS disease with crisis, unspecified: Secondary | ICD-10-CM | POA: Diagnosis present

## 2020-05-13 LAB — RETICULOCYTES
Immature Retic Fract: 31 % — ABNORMAL HIGH (ref 2.3–15.9)
RBC.: 4.08 MIL/uL (ref 3.87–5.11)
Retic Count, Absolute: 147.7 10*3/uL (ref 19.0–186.0)
Retic Ct Pct: 3.6 % — ABNORMAL HIGH (ref 0.4–3.1)

## 2020-05-13 LAB — CBC WITH DIFFERENTIAL/PLATELET
Abs Immature Granulocytes: 0.02 10*3/uL (ref 0.00–0.07)
Basophils Absolute: 0.1 10*3/uL (ref 0.0–0.1)
Basophils Relative: 1 %
Eosinophils Absolute: 0.1 10*3/uL (ref 0.0–0.5)
Eosinophils Relative: 2 %
HCT: 31.7 % — ABNORMAL LOW (ref 36.0–46.0)
Hemoglobin: 11.6 g/dL — ABNORMAL LOW (ref 12.0–15.0)
Immature Granulocytes: 0 %
Lymphocytes Relative: 31 %
Lymphs Abs: 2.3 10*3/uL (ref 0.7–4.0)
MCH: 28 pg (ref 26.0–34.0)
MCHC: 36.6 g/dL — ABNORMAL HIGH (ref 30.0–36.0)
MCV: 76.6 fL — ABNORMAL LOW (ref 80.0–100.0)
Monocytes Absolute: 0.5 10*3/uL (ref 0.1–1.0)
Monocytes Relative: 7 %
Neutro Abs: 4.4 10*3/uL (ref 1.7–7.7)
Neutrophils Relative %: 59 %
Platelets: 458 10*3/uL — ABNORMAL HIGH (ref 150–400)
RBC: 4.14 MIL/uL (ref 3.87–5.11)
RDW: 13.1 % (ref 11.5–15.5)
WBC: 7.5 10*3/uL (ref 4.0–10.5)
nRBC: 1.9 % — ABNORMAL HIGH (ref 0.0–0.2)

## 2020-05-13 LAB — I-STAT BETA HCG BLOOD, ED (MC, WL, AP ONLY): I-stat hCG, quantitative: <5 m[IU]/mL (ref <5)

## 2020-05-13 LAB — BASIC METABOLIC PANEL
Anion gap: 7 (ref 5–15)
BUN: 8 mg/dL (ref 6–20)
CO2: 27 mmol/L (ref 22–32)
Calcium: 9.1 mg/dL (ref 8.9–10.3)
Chloride: 102 mmol/L (ref 98–111)
Creatinine, Ser: 0.87 mg/dL (ref 0.44–1.00)
GFR calc Af Amer: >60 mL/min (ref >60)
GFR calc non Af Amer: >60 mL/min (ref >60)
Glucose, Bld: 102 mg/dL — ABNORMAL HIGH (ref 70–99)
Potassium: 5.9 mmol/L — ABNORMAL HIGH (ref 3.5–5.1)
Sodium: 136 mmol/L (ref 135–145)

## 2020-05-13 MED ORDER — HYDROMORPHONE HCL 2 MG/ML IJ SOLN
2.0000 mg | INTRAMUSCULAR | Status: AC
  Administered 2020-05-13: 2 mg via INTRAVENOUS
  Filled 2020-05-13: qty 1

## 2020-05-13 MED ORDER — DEXTROSE-NACL 5-0.45 % IV SOLN: INTRAVENOUS | Status: DC

## 2020-05-13 MED ORDER — HYDROMORPHONE HCL 1 MG/ML IJ SOLN
1.0000 mg | INTRAMUSCULAR | Status: AC
  Administered 2020-05-13: 1 mg via INTRAVENOUS
  Filled 2020-05-13: qty 1

## 2020-05-13 NOTE — ED Provider Notes (Signed)
[name] Provider Note   CSN: [id number] Arrival date & time: 05/13/20  2110     History Chief Complaint  Patient presents with  . Sickle Cell Pain Crisis    Hannah Hawkins is a 34 y.o. female with a past medical history of sickle cell anemia, presenting to the ED with a chief complaint of sickle cell pain crisis.  Reports pain throughout her bilateral lower extremities and back, bilateral hands which is typical of her pain crises.  States that her pain began today.  She ran out of her home pain medication so she was unable to take anything today.  She denies any chest pain, shortness of breath, fever, vomiting or diarrhea.  She denies any injuries or falls, numbness or weakness.  HPI     Past Medical History:  Diagnosis Date  . Sickle cell anemia [facility])     Patient Active Problem List   Diagnosis Date Noted  . Hypokalemia 07/17/2019  . GERD (gastroesophageal reflux disease) 05/29/2019  . History of cocaine use 05/18/2019  . Chronic pain syndrome   . Sickle cell crisis ([place]) 05/15/2019  . Cocaine use   . Sickle cell pain crisis ([place]) 11/14/2017  . Anemia of chronic disease 11/13/2017  . Sinusitis, chronic 11/13/2017  . Sickle cell anemia with crisis ([place]) 11/02/2017  . 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 EYE SURGERY  03/09/2017   left eye  . EYE SURGERY Right 09/2019   right hemerage      OB History    Gravida  2   Para      Term      Preterm      AB      Living  1     SAB      TAB      Ectopic      Multiple      Live Births              Family History  Problem Relation Age of Onset  . Diabetes Father     Social History   Tobacco Use  . Smoking status: Former Smoker    Types: Cigarettes  . Smokeless tobacco: Never Used  Substance Use Topics  . Alcohol use: Not Currently  . Drug use: No    Home Medications Prior to Admission  medications   Medication Sig Start Date End Date Taking? Authorizing Provider  albuterol (VENTOLIN HFA) 108 (90 Base) MCG/ACT inhaler Inhale 2 puffs into the lungs every 6 (six) hours as needed for wheezing or shortness of breath. 05/02/20  Yes Vevelyn Francois, NP  citalopram (CELEXA) 10 MG tablet Take 1 tablet (10 mg total) by mouth daily for 14 days, THEN 2 tablets (20 mg total) daily. 04/28/20 06/11/20 Yes Azzie Glatter, FNP  hydroxyurea (HYDREA) 500 MG capsule TAKE 2 CAPSULES(1000 MG) BY MOUTH DAILY Patient taking differently: Take 1,000 mg by mouth daily.  10/01/19  Yes Tresa Garter, MD  levocetirizine (XYZAL) 5 MG tablet Take 5 mg by mouth every evening.   Yes [provider]  lidocaine (LIDODERM) 5 % Place 1 patch onto the skin daily. Remove & Discard patch within 12 hours or as directed by MD 03/20/20  Yes Tresa Garter, MD  montelukast (SINGULAIR) 10 MG tablet Take 1 tablet (10 mg total) by mouth at bedtime. 05/02/20  Yes Vevelyn Francois, NP  morphine (MS CONTIN) 30 MG  12 hr tablet Take 1 tablet (30 mg total) by mouth every 12 (twelve) hours. Must last 30 days. 04/22/20 05/22/20 Yes King, Diona Foley, NP  NARCAN 4 MG/0.1ML LIQD nasal spray kit Place 4 mg into the nose once as needed (overdose).  12/22/17  Yes [provider]  ondansetron (ZOFRAN) 4 MG tablet Take 4 mg by mouth every 8 (eight) hours as needed for nausea or vomiting.   Yes [provider]  oxyCODONE (ROXICODONE) 15 MG immediate release tablet Take 1 tablet (15 mg total) by mouth every 4 (four) hours as needed for up to 15 days for pain (moderate to severe pain). 04/29/20 05/14/20 Yes Azzie Glatter, FNP  pantoprazole (PROTONIX) 40 MG tablet Take 1 tablet (40 mg total) by mouth daily. 05/10/19  Yes Lanae Boast, FNP  promethazine (PHENERGAN) 12.5 MG tablet Take 1 tablet (12.5 mg total) by mouth every 4 (four) hours as needed for nausea or vomiting. 02/25/20  Yes Vevelyn Francois, NP  tiZANidine  (ZANAFLEX) 2 MG tablet Take 1 tablet (2 mg total) by mouth every 8 (eight) hours as needed for muscle spasms. 04/21/20 07/20/20 Yes Vevelyn Francois, NP  azithromycin (ZITHROMAX) 250 MG tablet 2 tabs x 1 day then daily x 4 times Patient not taking: Reported on 05/13/2020 05/02/20   Vevelyn Francois, NP  benzonatate (TESSALON) 100 MG capsule Take 1 capsule (100 mg total) by mouth 3 (three) times daily as needed for cough. Never suck or chew on a benzonatate capsule. Patient not taking: Reported on 05/13/2020 05/02/20 07/31/20  Vevelyn Francois, NP  Cholecalciferol 125 MCG (5000 UT) capsule Take 1 capsule (5,000 Units total) by mouth daily. Patient not taking: Reported on 05/02/2020 02/22/20 05/22/20  Vevelyn Francois, NP  ergocalciferol (VITAMIN D2) 1.25 MG (50000 UT) capsule Take 1 capsule (50,000 Units total) by mouth once a week. Patient not taking: Reported on 05/02/2020 05/10/19   Lanae Boast, FNP  guaiFENesin-codeine 100-10 MG/5ML syrup Take 5 mLs by mouth 3 (three) times daily as needed for cough. Patient not taking: Reported on 05/02/2020 04/07/20   Vevelyn Francois, NP    Allergies    Penicillins  Review of Systems   Review of Systems  Constitutional: Negative for appetite change, chills and fever.  HENT: Negative for ear pain, rhinorrhea, sneezing and sore throat.   Eyes: Negative for photophobia and visual disturbance.  Respiratory: Negative for cough, chest tightness, shortness of breath and wheezing.   Cardiovascular: Negative for chest pain and palpitations.  Gastrointestinal: Negative for abdominal pain, blood in stool, constipation, diarrhea, nausea and vomiting.  Genitourinary: Negative for dysuria, hematuria and urgency.  Musculoskeletal: Positive for myalgias.  Skin: Negative for rash.  Neurological: Negative for dizziness, weakness and light-headedness.    Physical Exam Updated Vital Signs BP (!) 126/108   Pulse 70   Temp 97.8 F (36.6 C) (Oral)   Resp 17   Ht '5\' 2"'  (1.575 m)    Wt 63.5 kg   LMP 04/24/2020   SpO2 100%   BMI 25.61 kg/m   Physical Exam Vitals and nursing note reviewed.  Constitutional:      General: She is not in acute distress.    Appearance: She is well-developed.  HENT:     Head: Normocephalic and atraumatic.     Nose: Nose normal.  Eyes:     General: No scleral icterus.       Left eye: No discharge.     Conjunctiva/sclera: Conjunctivae normal.  Cardiovascular:     Rate and Rhythm: Normal rate and regular rhythm.     Heart sounds: Normal heart sounds. No murmur. No friction rub. No gallop.   Pulmonary:     Effort: Pulmonary effort is normal. No respiratory distress.     Breath sounds: Normal breath sounds.  Abdominal:     General: Bowel sounds are normal. There is no distension.     Palpations: Abdomen is soft.     Tenderness: There is no abdominal tenderness. There is no guarding.  Musculoskeletal:        General: Normal range of motion.     Cervical back: Normal range of motion and neck supple.  Skin:    General: Skin is warm and dry.     Findings: No rash.  Neurological:     Mental Status: She is alert.     Motor: No abnormal muscle tone.     Coordination: Coordination normal.     ED Results / Procedures / Treatments   Labs (all labs ordered are listed, but only abnormal results are displayed) Labs Reviewed  CBC WITH DIFFERENTIAL/PLATELET - Abnormal; Notable for the following components:      Result Value   Hemoglobin 11.6 (*)    HCT 31.7 (*)    MCV 76.6 (*)    MCHC 36.6 (*)    Platelets 458 (*)    nRBC 1.9 (*)    All other components within normal limits  RETICULOCYTES - Abnormal; Notable for the following components:   Retic Ct Pct 3.6 (*)    Immature Retic Fract 31.0 (*)    All other components within normal limits  BASIC METABOLIC PANEL  I-STAT BETA HCG BLOOD, ED ([facility], [facility], [facility] ONLY)    EKG None  Radiology No results found.  Procedures Procedures (including critical care time)  Medications Ordered  in ED Medications  dextrose 5 %-0.45 % sodium chloride infusion ( Intravenous New Bag/Given (Non-Interop) 05/13/20 2215)  HYDROmorphone (DILAUDID) injection 1 mg (1 mg Intravenous Given 05/13/20 2211)  HYDROmorphone (DILAUDID) injection 2 mg (2 mg Intravenous Given 05/13/20 2250)    ED Course  I have reviewed the triage vital signs and the nursing notes.  Pertinent labs & imaging results that were available during my care of the patient were reviewed by me and considered in my medical decision making (see chart for details).    MDM Rules/Calculators/A&P                      34 year old female with a past medical history of sickle cell anemia presenting to the ED with a chief complaint of sickle cell pain crisis.  Reports pain throughout her bilateral lower extremities, hands, back which is typical of her pain crises.  She ran out of her home pain medications did not take anything today.  Denies injuries or falls, chest pain, shortness of breath, fever or vomiting.  On exam no signs of respiratory distress noted.  Lab work significant for hemoglobin of 11.6 which is similar to baseline.  hCG is negative.  Will attempt to control pain here.  11:11 PM Patient given a total of 3 mg of Dilaudid here without improvement in her pain.  She is requesting admission for ongoing pain management.   Portions of this note were generated with [employer]. Dictation errors may occur despite best attempts at proofreading.  Final Clinical Impression(s) / ED Diagnoses Final diagnoses:  Sickle cell pain crisis ([place])  Rx / DC Orders ED Discharge Orders    None       Delia Heady, PA-C 05/13/20 2313    Quintella Reichert, MD 05/15/20 [phone]

## 2020-05-13 NOTE — ED Triage Notes (Signed)
Pt BIB EMS from home. 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 Reports pain located in arms, hands, feet, and legs. Reports pt ran out of her sickle cell medications yesterday and will not be getting morel meds until tomorrow.   68 pulse 132/84 100% RA 18 RR 97.5 temp

## 2020-05-14 ENCOUNTER — Ambulatory Visit: Payer: Medicaid Other | Admitting: Nurse Practitioner

## 2020-05-14 DIAGNOSIS — Z880 Allergy status to penicillin: Secondary | ICD-10-CM | POA: Diagnosis not present

## 2020-05-14 DIAGNOSIS — D5700 Hb-SS disease with crisis, unspecified: Secondary | ICD-10-CM | POA: Diagnosis present

## 2020-05-14 DIAGNOSIS — F1120 Opioid dependence, uncomplicated: Secondary | ICD-10-CM | POA: Diagnosis present

## 2020-05-14 DIAGNOSIS — E875 Hyperkalemia: Secondary | ICD-10-CM | POA: Diagnosis present

## 2020-05-14 DIAGNOSIS — Z833 Family history of diabetes mellitus: Secondary | ICD-10-CM | POA: Diagnosis not present

## 2020-05-14 DIAGNOSIS — Z79899 Other long term (current) drug therapy: Secondary | ICD-10-CM | POA: Diagnosis not present

## 2020-05-14 DIAGNOSIS — K219 Gastro-esophageal reflux disease without esophagitis: Secondary | ICD-10-CM | POA: Diagnosis present

## 2020-05-14 DIAGNOSIS — G894 Chronic pain syndrome: Secondary | ICD-10-CM | POA: Diagnosis present

## 2020-05-14 DIAGNOSIS — Z20822 Contact with and (suspected) exposure to covid-19: Secondary | ICD-10-CM | POA: Diagnosis present

## 2020-05-14 DIAGNOSIS — Z87891 Personal history of nicotine dependence: Secondary | ICD-10-CM | POA: Diagnosis not present

## 2020-05-14 LAB — BASIC METABOLIC PANEL
Anion gap: 10 (ref 5–15)
BUN: 9 mg/dL (ref 6–20)
CO2: 24 mmol/L (ref 22–32)
Calcium: 8.4 mg/dL — ABNORMAL LOW (ref 8.9–10.3)
Chloride: 104 mmol/L (ref 98–111)
Creatinine, Ser: 0.68 mg/dL (ref 0.44–1.00)
GFR calc Af Amer: >60 mL/min (ref >60)
GFR calc non Af Amer: >60 mL/min (ref >60)
Glucose, Bld: 104 mg/dL — ABNORMAL HIGH (ref 70–99)
Potassium: 4 mmol/L (ref 3.5–5.1)
Sodium: 138 mmol/L (ref 135–145)

## 2020-05-14 LAB — CBC
HCT: 28.7 % — ABNORMAL LOW (ref 36.0–46.0)
Hemoglobin: 10.5 g/dL — ABNORMAL LOW (ref 12.0–15.0)
MCH: 28.2 pg (ref 26.0–34.0)
MCHC: 36.6 g/dL — ABNORMAL HIGH (ref 30.0–36.0)
MCV: 76.9 fL — ABNORMAL LOW (ref 80.0–100.0)
Platelets: 397 10*3/uL (ref 150–400)
RBC: 3.73 MIL/uL — ABNORMAL LOW (ref 3.87–5.11)
RDW: 13 % (ref 11.5–15.5)
WBC: 10.5 10*3/uL (ref 4.0–10.5)
nRBC: 1.5 % — ABNORMAL HIGH (ref 0.0–0.2)

## 2020-05-14 LAB — CREATININE, SERUM
Creatinine, Ser: 0.72 mg/dL (ref 0.44–1.00)
GFR calc Af Amer: >60 mL/min (ref >60)
GFR calc non Af Amer: >60 mL/min (ref >60)

## 2020-05-14 LAB — SARS CORONAVIRUS 2 BY RT PCR (HOSPITAL ORDER, PERFORMED IN ~~LOC~~ HOSPITAL LAB): SARS Coronavirus 2: NEGATIVE

## 2020-05-14 MED ORDER — ONDANSETRON HCL 4 MG/2ML IJ SOLN
4.0000 mg | Freq: Four times a day (QID) | INTRAMUSCULAR | Status: DC | PRN
  Administered 2020-05-14: 4 mg via INTRAVENOUS
  Filled 2020-05-14: qty 2

## 2020-05-14 MED ORDER — MORPHINE SULFATE ER 30 MG PO TBCR
30.0000 mg | EXTENDED_RELEASE_TABLET | Freq: Two times a day (BID) | ORAL | Status: DC
  Administered 2020-05-14 – 2020-05-16 (×6): 30 mg via ORAL
  Filled 2020-05-14 (×6): qty 1

## 2020-05-14 MED ORDER — KETOROLAC TROMETHAMINE 15 MG/ML IJ SOLN
15.0000 mg | Freq: Four times a day (QID) | INTRAMUSCULAR | Status: DC
  Administered 2020-05-14 – 2020-05-16 (×11): 15 mg via INTRAVENOUS
  Filled 2020-05-14 (×11): qty 1

## 2020-05-14 MED ORDER — HYDROXYUREA 500 MG PO CAPS
1000.0000 mg | ORAL_CAPSULE | Freq: Every day | ORAL | Status: DC
  Filled 2020-05-14 (×2): qty 2

## 2020-05-14 MED ORDER — HYDROMORPHONE HCL 1 MG/ML IJ SOLN
1.0000 mg | INTRAMUSCULAR | Status: DC | PRN
  Administered 2020-05-14 (×3): 1 mg via INTRAVENOUS
  Filled 2020-05-14 (×3): qty 1

## 2020-05-14 MED ORDER — ENOXAPARIN SODIUM 40 MG/0.4ML ~~LOC~~ SOLN
40.0000 mg | SUBCUTANEOUS | Status: DC
  Administered 2020-05-14 – 2020-05-16 (×3): 40 mg via SUBCUTANEOUS
  Filled 2020-05-14 (×3): qty 0.4

## 2020-05-14 MED ORDER — DIPHENHYDRAMINE HCL 50 MG/ML IJ SOLN
25.0000 mg | Freq: Four times a day (QID) | INTRAMUSCULAR | Status: DC | PRN
  Administered 2020-05-14: 25 mg via INTRAVENOUS
  Filled 2020-05-14: qty 1

## 2020-05-14 MED ORDER — IPRATROPIUM-ALBUTEROL 0.5-2.5 (3) MG/3ML IN SOLN: 3.0000 mL | Freq: Four times a day (QID) | RESPIRATORY_TRACT | Status: DC | PRN

## 2020-05-14 MED ORDER — POLYETHYLENE GLYCOL 3350 17 G PO PACK: 17.0000 g | PACK | Freq: Every day | ORAL | Status: DC | PRN

## 2020-05-14 MED ORDER — PANTOPRAZOLE SODIUM 40 MG PO TBEC
40.0000 mg | DELAYED_RELEASE_TABLET | Freq: Every day | ORAL | Status: DC
  Administered 2020-05-14 – 2020-05-16 (×3): 40 mg via ORAL
  Filled 2020-05-14 (×3): qty 1

## 2020-05-14 MED ORDER — OXYCODONE HCL 5 MG PO TABS
15.0000 mg | ORAL_TABLET | ORAL | Status: DC | PRN
  Administered 2020-05-14 – 2020-05-16 (×6): 15 mg via ORAL
  Filled 2020-05-14 (×6): qty 3

## 2020-05-14 MED ORDER — TIZANIDINE HCL 4 MG PO TABS
2.0000 mg | ORAL_TABLET | Freq: Three times a day (TID) | ORAL | Status: DC | PRN
  Administered 2020-05-14 – 2020-05-15 (×2): 2 mg via ORAL
  Filled 2020-05-14 (×2): qty 1

## 2020-05-14 MED ORDER — ONDANSETRON HCL 4 MG/2ML IJ SOLN
4.0000 mg | INTRAMUSCULAR | Status: DC | PRN
  Administered 2020-05-14: 4 mg via INTRAVENOUS
  Filled 2020-05-14: qty 2

## 2020-05-14 MED ORDER — ONDANSETRON HCL 4 MG PO TABS: 4.0000 mg | ORAL_TABLET | ORAL | Status: DC | PRN

## 2020-05-14 MED ORDER — HYDROMORPHONE 1 MG/ML IV SOLN
INTRAVENOUS | Status: DC
  Administered 2020-05-14: 4.5 mg via INTRAVENOUS
  Administered 2020-05-14: 7 mg via INTRAVENOUS
  Administered 2020-05-14: 30 mg via INTRAVENOUS
  Administered 2020-05-15: 4.5 mg via INTRAVENOUS
  Administered 2020-05-15: 5.5 mg via INTRAVENOUS
  Administered 2020-05-15: 4.5 mg via INTRAVENOUS
  Administered 2020-05-15: 4 mg via INTRAVENOUS
  Administered 2020-05-15: 2 mg via INTRAVENOUS
  Administered 2020-05-15 – 2020-05-16 (×2): 30 mg via INTRAVENOUS
  Administered 2020-05-16: 9 mg via INTRAVENOUS
  Administered 2020-05-16: 2 mg via INTRAVENOUS
  Administered 2020-05-16: 5 mg via INTRAVENOUS
  Filled 2020-05-14 (×3): qty 30

## 2020-05-14 MED ORDER — SODIUM CHLORIDE 0.9% FLUSH: 9.0000 mL | INTRAVENOUS | Status: DC | PRN

## 2020-05-14 MED ORDER — MONTELUKAST SODIUM 10 MG PO TABS
10.0000 mg | ORAL_TABLET | Freq: Every day | ORAL | Status: DC
  Administered 2020-05-14 – 2020-05-15 (×2): 10 mg via ORAL
  Filled 2020-05-14 (×2): qty 1

## 2020-05-14 MED ORDER — CETIRIZINE HCL 10 MG PO TABS
10.0000 mg | ORAL_TABLET | Freq: Every evening | ORAL | Status: DC
  Administered 2020-05-14 – 2020-05-15 (×2): 10 mg via ORAL
  Filled 2020-05-14 (×3): qty 1

## 2020-05-14 MED ORDER — DIPHENHYDRAMINE HCL 25 MG PO CAPS: 25.0000 mg | ORAL_CAPSULE | ORAL | Status: DC | PRN

## 2020-05-14 MED ORDER — CITALOPRAM HYDROBROMIDE 20 MG PO TABS
10.0000 mg | ORAL_TABLET | Freq: Every day | ORAL | Status: DC
  Administered 2020-05-14 – 2020-05-16 (×3): 10 mg via ORAL
  Filled 2020-05-14 (×3): qty 1

## 2020-05-14 MED ORDER — NALOXONE HCL 0.4 MG/ML IJ SOLN: 0.4000 mg | INTRAMUSCULAR | Status: DC | PRN

## 2020-05-14 MED ORDER — SENNOSIDES-DOCUSATE SODIUM 8.6-50 MG PO TABS
1.0000 | ORAL_TABLET | Freq: Two times a day (BID) | ORAL | Status: DC
  Administered 2020-05-14 – 2020-05-16 (×5): 1 via ORAL
  Filled 2020-05-14 (×6): qty 1

## 2020-05-14 MED ORDER — PROMETHAZINE HCL 25 MG PO TABS: 12.5000 mg | ORAL_TABLET | ORAL | Status: DC | PRN

## 2020-05-14 MED ORDER — ALBUTEROL SULFATE HFA 108 (90 BASE) MCG/ACT IN AERS: 2.0000 | INHALATION_SPRAY | Freq: Four times a day (QID) | RESPIRATORY_TRACT | Status: DC | PRN

## 2020-05-14 NOTE — Plan of Care (Signed)
  Problem: Health Behavior/Discharge Planning: Goal: Ability to manage health-related needs will improve Outcome: Progressing   Problem: Clinical Measurements: Goal: Ability to maintain clinical measurements within normal limits will improve Outcome: Progressing Goal: Will remain free from infection Outcome: Progressing Goal: Diagnostic test results will improve Outcome: Progressing Goal: Respiratory complications will improve Outcome: Progressing Goal: Cardiovascular complication will be avoided Outcome: Progressing   Problem: Activity: Goal: Risk for activity intolerance will decrease Outcome: Progressing   Problem: Elimination: Goal: Will not experience complications related to bowel motility Outcome: Progressing Goal: Will not experience complications related to urinary retention Outcome: Progressing   Problem: Pain Managment: Goal: General experience of comfort will improve Outcome: Progressing   Problem: Safety: Goal: Ability to remain free from injury will improve Outcome: Progressing   

## 2020-05-14 NOTE — H&P (Addendum)
History and Physical   [facility] [place] [facility] [place] Admission History and Physical [employer], D.O.    Patient Name: Hannah Hawkins MR#: [id number] Date of Birth: 1986-07-29 Date of Admission: 05/13/2020  Referring MD/NP/PA: PA Maguayo Primary Care Physician: Vevelyn Francois, NP  Chief Complaint:  Chief Complaint  Patient presents with  . Sickle Cell Pain Crisis    HPI: Hannah Hawkins is a 34 y.o. female with a known history of sickle cell anemia, GERD, hypokalemia presents to the emergency department for evaluation of bilateral lower extremity, bilateral hand and low back pain which is consistent with previous sickle cell pain crises.  Patient ran out of her medications at home.  She usually follows with the sickle cell team..   Of note two weeks ago she was diagnosed with bronchitis, took antibiotics, inhaler and a medrol dose pak. She has a slight cough from that episode.   Patient denies fevers/chills, weakness, dizziness, chest pain, shortness of breath, N/V/C/D, abdominal pain, dysuria/frequency, changes in mental status.    Otherwise there has been no change in status. Patient has been taking medication as prescribed and there has been no recent change in medication or diet.  No recent antibiotics.  There has been no recent illness, hospitalizations, travel or sick contacts.    EMS/ED Course: Patient received Dilaudid, D5 half-normal saline. Medical admission has been requested for further management of sickle cell pain crisis.  Review of Systems:  CONSTITUTIONAL: No fever/chills, fatigue, weakness, weight gain/loss, headache. EYES: No blurry or double vision. ENT: No tinnitus, postnasal drip, redness or soreness of the oropharynx. RESPIRATORY: No cough, dyspnea, wheeze.  No hemoptysis.  CARDIOVASCULAR: No chest pain, palpitations, syncope, orthopnea. No lower extremity edema.  GASTROINTESTINAL: No nausea, vomiting, abdominal pain, diarrhea,  constipation.  No hematemesis, melena or hematochezia. GENITOURINARY: No dysuria, frequency, hematuria. ENDOCRINE: No polyuria or nocturia. No heat or cold intolerance. HEMATOLOGY: No anemia, bruising, bleeding. INTEGUMENTARY: No rashes, ulcers, lesions. MUSCULOSKELETAL: HPI.  No arthritis, gout, dyspnea. NEUROLOGIC: No numbness, tingling, ataxia, seizure-type activity, weakness. PSYCHIATRIC: No anxiety, depression, insomnia.   Past Medical History:  Diagnosis Date  . Sickle cell anemia ([facility])     Past Surgical History:  Procedure Laterality Date  . EYE SURGERY  03/09/2017   left eye  . EYE SURGERY Right 09/2019   right hemerage      reports that she has quit smoking. Her smoking use included cigarettes. She has never used smokeless tobacco. She reports previous alcohol use. She reports that she does not use drugs.  Allergies  Allergen Reactions  . Penicillins Swelling and Rash    Has patient had a PCN reaction causing immediate rash, facial/tongue/throat swelling, SOB or lightheadedness with hypotension: Yes Has patient had a PCN reaction causing severe rash involving mucus membranes or skin necrosis: Yes Has patient had a PCN reaction that required hospitalization No Has patient had a PCN reaction occurring within the last 10 years: no If all of the above answers are "NO", then may proceed with Cephalosporin use.     Family History  Problem Relation Age of Onset  . Diabetes Father     Prior to Admission medications   Medication Sig Start Date End Date Taking? Authorizing Provider  albuterol (VENTOLIN HFA) 108 (90 Base) MCG/ACT inhaler Inhale 2 puffs into the lungs every 6 (six) hours as needed for wheezing or shortness of breath. 05/02/20  Yes Vevelyn Francois, NP  citalopram (CELEXA) 10 MG tablet Take 1 tablet (  10 mg total) by mouth daily for 14 days, THEN 2 tablets (20 mg total) daily. 04/28/20 06/11/20 Yes Azzie Glatter, FNP  hydroxyurea (HYDREA) 500 MG capsule TAKE 2  CAPSULES(1000 MG) BY MOUTH DAILY Patient taking differently: Take 1,000 mg by mouth daily.  10/01/19  Yes Tresa Garter, MD  levocetirizine (XYZAL) 5 MG tablet Take 5 mg by mouth every evening.   Yes [provider]  lidocaine (LIDODERM) 5 % Place 1 patch onto the skin daily. Remove & Discard patch within 12 hours or as directed by MD 03/20/20  Yes Tresa Garter, MD  montelukast (SINGULAIR) 10 MG tablet Take 1 tablet (10 mg total) by mouth at bedtime. 05/02/20  Yes Vevelyn Francois, NP  morphine (MS CONTIN) 30 MG 12 hr tablet Take 1 tablet (30 mg total) by mouth every 12 (twelve) hours. Must last 30 days. 04/22/20 05/22/20 Yes King, Diona Foley, NP  NARCAN 4 MG/0.1ML LIQD nasal spray kit Place 4 mg into the nose once as needed (overdose).  12/22/17  Yes [provider]  ondansetron (ZOFRAN) 4 MG tablet Take 4 mg by mouth every 8 (eight) hours as needed for nausea or vomiting.   Yes [provider]  oxyCODONE (ROXICODONE) 15 MG immediate release tablet Take 1 tablet (15 mg total) by mouth every 4 (four) hours as needed for up to 15 days for pain (moderate to severe pain). 04/29/20 05/14/20 Yes Azzie Glatter, FNP  pantoprazole (PROTONIX) 40 MG tablet Take 1 tablet (40 mg total) by mouth daily. 05/10/19  Yes Lanae Boast, FNP  promethazine (PHENERGAN) 12.5 MG tablet Take 1 tablet (12.5 mg total) by mouth every 4 (four) hours as needed for nausea or vomiting. 02/25/20  Yes Vevelyn Francois, NP  tiZANidine (ZANAFLEX) 2 MG tablet Take 1 tablet (2 mg total) by mouth every 8 (eight) hours as needed for muscle spasms. 04/21/20 07/20/20 Yes Vevelyn Francois, NP  azithromycin (ZITHROMAX) 250 MG tablet 2 tabs x 1 day then daily x 4 times Patient not taking: Reported on 05/13/2020 05/02/20   Vevelyn Francois, NP  benzonatate (TESSALON) 100 MG capsule Take 1 capsule (100 mg total) by mouth 3 (three) times daily as needed for cough. Never suck or chew on a benzonatate capsule. Patient not  taking: Reported on 05/13/2020 05/02/20 07/31/20  Vevelyn Francois, NP  Cholecalciferol 125 MCG (5000 UT) capsule Take 1 capsule (5,000 Units total) by mouth daily. Patient not taking: Reported on 05/02/2020 02/22/20 05/22/20  Vevelyn Francois, NP  ergocalciferol (VITAMIN D2) 1.25 MG (50000 UT) capsule Take 1 capsule (50,000 Units total) by mouth once a week. Patient not taking: Reported on 05/02/2020 05/10/19   Lanae Boast, FNP  guaiFENesin-codeine 100-10 MG/5ML syrup Take 5 mLs by mouth 3 (three) times daily as needed for cough. Patient not taking: Reported on 05/02/2020 04/07/20   Vevelyn Francois, NP    Physical Exam: Vitals:   05/13/20 2126 05/13/20 2200 05/13/20 2249 05/13/20 2300  BP:  (!) 132/96 132/86 (!) 126/108  Pulse:  (!) 55  70  Resp:  '16 11 17  ' Temp:      TempSrc:      SpO2:  100%  100%  Weight: 63.5 kg     Height: '5\' 2"'  (1.575 m)       GENERAL: 34 y.o.-year-old female patient, well-developed, well-nourished lying in the bed in no acute distress.  Pleasant and cooperative.   HEENT: Head atraumatic, normocephalic. Pupils equal. Mucus  membranes moist. NECK: Supple. No JVD. CHEST: Normal breath sounds bilaterally. No wheezing, rales, rhonchi or crackles. No use of accessory muscles of respiration.  No reproducible chest wall tenderness.  CARDIOVASCULAR: S1, S2 normal. No murmurs, rubs, or gallops. Cap refill <2 seconds. Pulses intact distally.  ABDOMEN: Soft, nondistended, nontender. No rebound, guarding, rigidity. Normoactive bowel sounds present in all four quadrants.  EXTREMITIES: No pedal edema, cyanosis, or clubbing. No calf tenderness or Homan's sign.  NEUROLOGIC: The patient is alert and oriented x 3. Cranial nerves II through XII are grossly intact with no focal sensorimotor deficit. PSYCHIATRIC:  Normal affect, mood, thought content. SKIN: Warm, dry, and intact without obvious rash, lesion, or ulcer.    Labs on Admission:  CBC: Recent Labs  Lab 05/12/20 0955  05/13/20 2143  WBC 10.1 7.5  NEUTROABS 4.0 4.4  HGB 10.4* 11.6*  HCT 28.8* 31.7*  MCV 78.0* 76.6*  PLT 396 850*   Basic Metabolic Panel: Recent Labs  Lab 05/12/20 0955 05/13/20 2143  NA 140 136  K 3.3* 5.9*  CL 103 102  CO2 28 27  GLUCOSE 97 102*  BUN 7 8  CREATININE 0.72 0.87  CALCIUM 8.7* 9.1   GFR: Estimated Creatinine Clearance: 79.1 mL/min (by C-G formula based on SCr of 0.87 mg/dL). Liver Function Tests: Recent Labs  Lab 05/12/20 0955  AST 25  ALT 18  ALKPHOS 48  BILITOT 0.3  PROT 6.8  ALBUMIN 4.0   No results for input(s): LIPASE, AMYLASE in the last 168 hours. No results for input(s): AMMONIA in the last 168 hours. Coagulation Profile: No results for input(s): INR, PROTIME in the last 168 hours. Cardiac Enzymes: No results for input(s): CKTOTAL, CKMB, CKMBINDEX, TROPONINI in the last 168 hours. BNP (last 3 results) No results for input(s): PROBNP in the last 8760 hours. HbA1C: No results for input(s): HGBA1C in the last 72 hours. CBG: No results for input(s): GLUCAP in the last 168 hours. Lipid Profile: No results for input(s): CHOL, HDL, LDLCALC, TRIG, CHOLHDL, LDLDIRECT in the last 72 hours. Thyroid Function Tests: No results for input(s): TSH, T4TOTAL, FREET4, T3FREE, THYROIDAB in the last 72 hours. Anemia Panel: Recent Labs    05/12/20 0955 05/13/20 2143  RETICCTPCT 4.7* 3.6*   Urine analysis:    Component Value Date/Time   COLORURINE YELLOW 02/08/2020 1026   APPEARANCEUR CLEAR 02/08/2020 1026   LABSPEC 1.013 02/08/2020 1026   PHURINE 6.0 02/08/2020 1026   GLUCOSEU NEGATIVE 02/08/2020 1026   HGBUR NEGATIVE 02/08/2020 1026   BILIRUBINUR neg 02/11/2020 1228   KETONESUR NEGATIVE 02/08/2020 1026   PROTEINUR Negative 02/11/2020 1228   PROTEINUR NEGATIVE 02/08/2020 1026   UROBILINOGEN 1.0 02/11/2020 1228   UROBILINOGEN 1.0 09/30/2017 1513   NITRITE neg 02/11/2020 1228   NITRITE NEGATIVE 02/08/2020 1026   LEUKOCYTESUR Trace (A)  02/11/2020 1228   LEUKOCYTESUR NEGATIVE 02/08/2020 1026   Sepsis Labs: '@LABRCNTIP' (procalcitonin:4,lacticidven:4) )No results found for this or any previous visit (from the past 240 hour(s)).   Radiological Exams on Admission: No results found.  Chest XR pending  Assessment/Plan  This is a 34 y.o. female with a history of sickle cell, hypokalemia, GERD now being admitted with:  #.  Sickle cell pain crisis -Admit inpatient -IV fluids -Pain control -Vitamins -Consult of sickle cell team -Resume home medications hydroxyurea D  #.  Hyperkalemia -Repeat BMP  #.  History of GERD -Continue Protonix   Reconcile and resume home medications including Celexa, Singulair  Admission status: Inpatient IV Fluids:  Normal saline Diet/Nutrition: Regular Consults called: Sickle cell team DVT Px: Lovenox, SCDs and early ambulation. Code Status: Full Code  Disposition Plan: To home in 1-2 days  All the records are reviewed and case discussed with ED provider. Management plans discussed with the patient and/or family who express understanding and agree with plan of care.  Alexis Hugelmeyer D.O. on 05/14/2020 at 12:17 AM CC: Primary care physician; Vevelyn Francois, NP   05/14/2020, 12:17 AM

## 2020-05-14 NOTE — Progress Notes (Signed)
Hannah Hawkins, a 34 year old female with a medical history significant for sickle cell disease, chronic pain syndrome, opiate dependence and tolerance, history of anemia of chronic disease, history of polysubstance abuse was admitted overnight for sickle cell pain crisis. Patient states that pain intensity is 9/10 and has not been controlled on current medication regimen.  Care plan: Mostly agree with current care plan Initiate IV Dilaudid PCA with settings of 0.5 mg, 10-minute lockout, and 3 mg/h. Repeat CBC in a.m.  Patient will be reassessed in a.m.   Nolon Nations  APRN, MSN, FNP-C [facility] [address] [place], [place] [id number] [phone]

## 2020-05-14 NOTE — ED Notes (Signed)
ED TO INPATIENT HANDOFF REPORT  Name/Age/Gender Hannah Hawkins 34 y.o. female  Code Status Code Status History    Date Active Date Inactive Code Status Order ID Comments User Context   05/12/2020 0945 05/12/2020 2303 Full Code [id number]  Massie Maroon, FNP Inpatient   03/20/2020 0940 03/20/2020 2153 Full Code [id number]  Quentin Angst, MD Inpatient   02/20/2020 1115 02/20/2020 2235 Full Code [id number]  Massie Maroon, FNP Inpatient   02/12/2020 0912 02/12/2020 2233 Full Code [id number]  Massie Maroon, FNP Inpatient   02/08/2020 1027 02/08/2020 2202 Full Code [id number]  Massie Maroon, FNP Inpatient   07/18/2019 0104 07/20/2019 2006 Full Code [id number]  Briscoe Deutscher, MD ED   07/05/2019 1008 07/05/2019 2039 Full Code [id number]  Massie Maroon, FNP Inpatient   06/07/2019 1214 06/07/2019 2012 Full Code [id number]  Massie Maroon, FNP Inpatient   05/29/2019 0418 05/31/2019 1943 Full Code [id number]  Therisa Doyne, MD Inpatient   05/15/2019 1825 05/18/2019 2134 Full Code [id number]  Pearson Grippe, MD ED   03/15/2019 1230 03/15/2019 2015 Full Code [id number]  Massie Maroon, FNP Inpatient   11/22/2018 1147 11/22/2018 1943 Full Code [id number]  Massie Maroon, FNP Inpatient   11/21/2018 1311 11/21/2018 2034 Full Code [id number]  Massie Maroon, FNP Inpatient   08/14/2018 1051 08/14/2018 2043 Full Code [id number]  Massie Maroon, FNP Inpatient   11/14/2017 0047 11/17/2017 1925 Full Code [id number]  Clydie Braun, MD ED   11/02/2017 1046 11/05/2017 1751 Full Code [id number]  Rometta Emery, MD Inpatient   10/13/2017 1021 10/13/2017 1547 Full Code [id number]  Massie Maroon, FNP Inpatient   Advance Care Planning Activity      Home/SNF/Other Home  Chief Complaint Sickle cell pain crisis (HCC) [D57.00]  Level of Care/Admitting Diagnosis ED Disposition    ED Disposition Condition Comment   Admit  Hospital Area: [facility] [[id number]]  Level of Care: Med-Surg  [16]  May admit patient to [facility] or [facility] if equivalent level of care is available:: Yes  Covid Evaluation: Asymptomatic Screening Protocol (No Symptoms)  Diagnosis: Sickle cell pain crisis [facility]) [[id number]]  Admitting Physician: Tonye Royalty [[id number]]  Attending Physician: Janann Colonel  Estimated length of stay: past midnight tomorrow  Certification:: I certify this patient will need inpatient services for at least 2 midnights       Medical History Past Medical History:  Diagnosis Date  . Sickle cell anemia (HCC)     Allergies Allergies  Allergen Reactions  . Penicillins Swelling and Rash    Has patient had a PCN reaction causing immediate rash, facial/tongue/throat swelling, SOB or lightheadedness with hypotension: Yes Has patient had a PCN reaction causing severe rash involving mucus membranes or skin necrosis: Yes Has patient had a PCN reaction that required hospitalization No Has patient had a PCN reaction occurring within the last 10 years: no If all of the above answers are "NO", then may proceed with Cephalosporin use.     IV Location/Drains/Wounds Patient Lines/Drains/Airways Status   Active Line/Drains/Airways    Name:   Placement date:   Placement time:   Site:   Days:   Peripheral IV 05/13/20 Right Antecubital   05/13/20    2210    Antecubital   1          Labs/Imaging Results for orders placed or performed during the hospital encounter of 05/13/20 (from the past 48 hour(s))  CBC WITH DIFFERENTIAL  Status: Abnormal   Collection Time: 05/13/20  9:43 PM  Result Value Ref Range   WBC 7.5 4.0 - 10.5 K/uL    Comment: WHITE COUNT CONFIRMED ON SMEAR   RBC 4.14 3.87 - 5.11 MIL/uL   Hemoglobin 11.6 (L) 12.0 - 15.0 g/dL   HCT 31.7 (L) 36.0 - 46.0 %   MCV 76.6 (L) 80.0 - 100.0 fL   MCH 28.0 26.0 - 34.0 pg   MCHC 36.6 (H) 30.0 - 36.0 g/dL   RDW 13.1 11.5 - 15.5 %   Platelets 458 (H) 150 - 400 K/uL   nRBC 1.9 (H) 0.0 - 0.2 %    Neutrophils Relative % 59 %   Neutro Abs 4.4 1.7 - 7.7 K/uL   Lymphocytes Relative 31 %   Lymphs Abs 2.3 0.7 - 4.0 K/uL   Monocytes Relative 7 %   Monocytes Absolute 0.5 0.1 - 1.0 K/uL   Eosinophils Relative 2 %   Eosinophils Absolute 0.1 0.0 - 0.5 K/uL   Basophils Relative 1 %   Basophils Absolute 0.1 0.0 - 0.1 K/uL   Immature Granulocytes 0 %   Abs Immature Granulocytes 0.02 0.00 - 0.07 K/uL   Target Cells PRESENT     Comment: Performed at [facility], [place] [address], [place], [place] [address]  Basic metabolic panel     Status: Abnormal   Collection Time: 05/13/20  9:43 PM  Result Value Ref Range   Sodium 136 135 - 145 mmol/L   Potassium 5.9 (H) 3.5 - 5.1 mmol/L    Comment: DELTA CHECK NOTED SLIGHT HEMOLYSIS    Chloride 102 98 - 111 mmol/L   CO2 27 22 - 32 mmol/L   Glucose, Bld 102 (H) 70 - 99 mg/dL    Comment: Glucose reference range applies only to samples taken after fasting for at least 8 hours.   BUN 8 6 - 20 mg/dL   Creatinine, Ser 0.87 0.44 - 1.00 mg/dL   Calcium 9.1 8.9 - 10.3 mg/dL   GFR calc non Af Amer >60 >60 mL/min   GFR calc Af Amer >60 >60 mL/min   Anion gap 7 5 - 15    Comment: Performed at [facility], [place] [address] [place], [place] [address]  Reticulocytes     Status: Abnormal   Collection Time: 05/13/20  9:43 PM  Result Value Ref Range   Retic Ct Pct 3.6 (H) 0.4 - 3.1 %   RBC. 4.08 3.87 - 5.11 MIL/uL   Retic Count, Absolute 147.7 19.0 - 186.0 K/uL   Immature Retic Fract 31.0 (H) 2.3 - 15.9 %    Comment: Performed at [facility], [place] [address], [place], [place] [address]  I-Stat beta hCG blood, ED     Status: None   Collection Time: 05/13/20 10:21 PM  Result Value Ref Range   I-stat hCG, quantitative <5.0 <5 mIU/mL   Comment 3            Comment:   GEST. AGE      CONC.  (mIU/mL)   <=1 WEEK        5 - 50     2 WEEKS       50 - 500     3 WEEKS       100 - 10,000     4 WEEKS     1,000 -  30,000        FEMALE AND NON-PREGNANT FEMALE:  LESS THAN 5 mIU/mL    No results found.  Pending Labs [employer] (From admission, onward)    Start     Ordered   05/14/20 0104  SARS Coronavirus 2 by RT PCR (hospital order, performed in [facility] hospital lab) Nasopharyngeal Nasopharyngeal Swab  (Tier 2 (TAT 2 hrs))  Once,   STAT    Question Answer Comment  Is this test for diagnosis or screening Screening   Symptomatic for COVID-19 as defined by CDC No   Hospitalized for COVID-19 No   Admitted to ICU for COVID-19 No   Previously tested for COVID-19 Yes   Resident in a congregate (group) care setting No   Employed in healthcare setting No   Pregnant No   Has patient completed COVID vaccination(s) (2 doses of Pfizer/Moderna 1 dose of [employer]) Unknown      05/14/20 0103   Signed and Held  CBC  (enoxaparin (LOVENOX)    CrCl >/= 30 ml/min)  Once,   R    Comments: Baseline for enoxaparin therapy IF NOT ALREADY DRAWN.  Notify MD if PLT < 100 K.    Signed and Held   Signed and Held  Creatinine, serum  (enoxaparin (LOVENOX)    CrCl >/= 30 ml/min)  Once,   R    Comments: Baseline for enoxaparin therapy IF NOT ALREADY DRAWN.    Signed and Held   Signed and Held  Creatinine, serum  (enoxaparin (LOVENOX)    CrCl >/= 30 ml/min)  Weekly,   R    Comments: while on enoxaparin therapy    Signed and Held          Vitals/Pain Today's Vitals   05/14/20 0021 05/14/20 0131 05/14/20 0132 05/14/20 0133  BP:    139/67  Pulse:  75  70  Resp:  18 16 16   Temp:      TempSrc:      SpO2:  100%  100%  Weight:      Height:      PainSc: 8        Isolation Precautions No active isolations  Medications Medications  dextrose 5 %-0.45 % sodium chloride infusion ( Intravenous Stopped 05/14/20 0137)  morphine (MS CONTIN) 12 hr tablet 30 mg (has no administration in time range)  oxyCODONE (Oxy IR/ROXICODONE) immediate release tablet 15 mg (has no administration in time range)   ketorolac (TORADOL) 15 MG/ML injection 15 mg (15 mg Intravenous Given 05/14/20 0137)  ondansetron (ZOFRAN) tablet 4 mg ( Oral See Alternative 05/14/20 0133)    Or  ondansetron (ZOFRAN) injection 4 mg (4 mg Intravenous Given 05/14/20 0133)  HYDROmorphone (DILAUDID) injection 1 mg (1 mg Intravenous Given 05/14/20 0140)  diphenhydrAMINE (BENADRYL) injection 25 mg (25 mg Intravenous Given 05/14/20 0135)  HYDROmorphone (DILAUDID) injection 1 mg (1 mg Intravenous Given 05/13/20 2211)  HYDROmorphone (DILAUDID) injection 2 mg (2 mg Intravenous Given 05/13/20 2250)    Mobility walks

## 2020-05-15 ENCOUNTER — Inpatient Hospital Stay (HOSPITAL_COMMUNITY): Payer: Medicaid Other

## 2020-05-15 DIAGNOSIS — D571 Sickle-cell disease without crisis: Secondary | ICD-10-CM

## 2020-05-15 DIAGNOSIS — D5700 Hb-SS disease with crisis, unspecified: Principal | ICD-10-CM

## 2020-05-15 MED ORDER — LIDOCAINE 5 % EX PTCH
1.0000 | MEDICATED_PATCH | CUTANEOUS | Status: DC
  Filled 2020-05-15: qty 1

## 2020-05-15 MED ORDER — LIDOCAINE 5 % EX PTCH
1.0000 | MEDICATED_PATCH | CUTANEOUS | Status: DC
  Administered 2020-05-15: 1 via TRANSDERMAL
  Filled 2020-05-15 (×2): qty 1

## 2020-05-15 MED ORDER — LIDOCAINE 5 % EX PTCH
1.0000 | MEDICATED_PATCH | CUTANEOUS | Status: DC
  Administered 2020-05-15: 1 via TRANSDERMAL
  Filled 2020-05-15: qty 1

## 2020-05-15 MED ORDER — LIDOCAINE 5 % EX PTCH: 1.0000 | MEDICATED_PATCH | CUTANEOUS | Status: DC

## 2020-05-15 NOTE — Progress Notes (Signed)
Patient ID: Hannah Hawkins, female   DOB: 1986/10/11, 34 y.o.   MRN: [id number] Subjective: Hannah Hawkins, a 34 year old female with a medical history significant for sickle cell disease, chronic pain syndrome, opiate dependence and tolerance, history of anemia of chronic disease, history of polysubstance abuse was admitted for sickle cell pain crisis.  Patient has no new complaint today, still having pain of about 7/10 in intensity but agreed that current pain regimen is working well. Her pain goal is below 5/10.  She denies any fever, cough, shortness of breath, chest pain, nausea, vomiting or diarrhea.  She has no urinary symptoms. Patient has been ambulating well on the hallway. And seen socializing with another patient on admission for sickle cell pain crisis.  Patient is talkative.  Objective:  Vital signs in last 24 hours:  Vitals:   05/15/20 0137 05/15/20 0550 05/15/20 0808 05/15/20 0959  BP: 119/73 (!) 130/93  124/76  Pulse: 62 91  64  Resp: 12 15  15   Temp: 98 F (36.7 C) 97.6 F (36.4 C)  98 F (36.7 C)  TempSrc: Oral Oral  Oral  SpO2: 100% 98% 98% 97%  Weight:  63.2 kg    Height:        Intake/Output from previous day:   Intake/Output Summary (Last 24 hours) at 05/15/2020 1425 Last data filed at 05/15/2020 0834 Gross per 24 hour  Intake 4585 ml  Output 700 ml  Net 3885 ml   Physical Exam: General: Alert, awake, oriented x3, in no acute distress.  HEENT: [place]/AT PEERL, EOMI Neck: Trachea midline,  no masses, no thyromegal,y no JVD, no carotid bruit OROPHARYNX:  Moist, No exudate/ erythema/lesions.  Heart: Regular rate and rhythm, without murmurs, rubs, gallops, PMI non-displaced, no heaves or thrills on palpation.  Lungs: Clear to auscultation, no wheezing or rhonchi noted. No increased vocal fremitus resonant to percussion  Abdomen: Soft, nontender, nondistended, positive bowel sounds, no masses no hepatosplenomegaly noted..  Neuro: No focal neurological deficits  noted cranial nerves II through XII grossly intact. DTRs 2+ bilaterally upper and lower extremities. Strength 5 out of 5 in bilateral upper and lower extremities. Musculoskeletal: No warm swelling or erythema around joints, no spinal tenderness noted. Psychiatric: Patient alert and oriented x3, good insight and cognition, good recent to remote recall. Lymph node survey: No cervical axillary or inguinal lymphadenopathy noted.  Lab Results:  Basic Metabolic Panel:    Component Value Date/Time   NA 138 05/14/2020 0505   NA 138 03/07/2020 1532   K 4.0 05/14/2020 0505   CL 104 05/14/2020 0505   CO2 24 05/14/2020 0505   BUN 9 05/14/2020 0505   BUN 6 03/07/2020 1532   CREATININE 0.72 05/14/2020 0508   CREATININE 0.69 08/04/2017 1208   GLUCOSE 104 (H) 05/14/2020 0505   CALCIUM 8.4 (L) 05/14/2020 0505   CBC:    Component Value Date/Time   WBC 10.5 05/14/2020 0508   HGB 10.5 (L) 05/14/2020 0508   HGB 9.9 (L) 03/07/2020 1532   HCT 28.7 (L) 05/14/2020 0508   HCT 28.7 (L) 03/07/2020 1532   PLT 397 05/14/2020 0508   PLT 311 03/07/2020 1532   MCV 76.9 (L) 05/14/2020 0508   MCV 89 03/07/2020 1532   NEUTROABS 4.4 05/13/2020 2143   NEUTROABS 4.7 03/07/2020 1532   LYMPHSABS 2.3 05/13/2020 2143   LYMPHSABS 5.0 (H) 03/07/2020 1532   MONOABS 0.5 05/13/2020 2143   EOSABS 0.1 05/13/2020 2143   EOSABS 0.7 (H) 03/07/2020 1532   BASOSABS  0.1 05/13/2020 2143   BASOSABS 0.2 03/07/2020 1532    Recent Results (from the past 240 hour(s))  SARS Coronavirus 2 by RT PCR (hospital order, performed in [facility] hospital lab) Nasopharyngeal Nasopharyngeal Swab     Status: None   Collection Time: 05/14/20  1:04 AM   Specimen: Nasopharyngeal Swab  Result Value Ref Range Status   SARS Coronavirus 2 NEGATIVE NEGATIVE Final    Comment: (NOTE) SARS-CoV-2 target nucleic acids are NOT DETECTED. The SARS-CoV-2 RNA is generally detectable in upper and lower respiratory specimens during the acute phase of  infection. The lowest concentration of SARS-CoV-2 viral copies this assay can detect is 250 copies / mL. A negative result does not preclude SARS-CoV-2 infection and should not be used as the sole basis for treatment or other patient management decisions.  A negative result may occur with improper specimen collection / handling, submission of specimen other than nasopharyngeal swab, presence of viral mutation(s) within the areas targeted by this assay, and inadequate number of viral copies (<250 copies / mL). A negative result must be combined with clinical observations, patient history, and epidemiological information. Fact Sheet for Patients:   BoilerBrush.com.cy Fact Sheet for Healthcare Providers: https://pope.com/ This test is not yet approved or cleared  by the [place] FDA and has been authorized for detection and/or diagnosis of SARS-CoV-2 by FDA under an Emergency Use Authorization (EUA).  This EUA will remain in effect (meaning this test can be used) for the duration of the COVID-19 declaration under Section 564(b)(1) of the Act, 21 U.S.C. section 360bbb-3(b)(1), unless the authorization is terminated or revoked sooner. Performed at [facility], [address] [place], [place] [address]     Studies/Results: No results found.  Medications: Scheduled Meds: . cetirizine  10 mg Oral QPM  . citalopram  10 mg Oral Daily  . enoxaparin (LOVENOX) injection  40 mg Subcutaneous Q24H  . HYDROmorphone   Intravenous Q4H  . hydroxyurea  1,000 mg Oral Daily  . ketorolac  15 mg Intravenous Q6H  . lidocaine  1 patch Transdermal Q24H  . montelukast  10 mg Oral QHS  . morphine  30 mg Oral Q12H  . pantoprazole  40 mg Oral Daily  . senna-docusate  1 tablet Oral BID   Continuous Infusions: . dextrose 5 % and 0.45% NaCl 125 mL/hr at 05/15/20 0954   PRN Meds:.diphenhydrAMINE, diphenhydrAMINE,  ipratropium-albuterol, naloxone **AND** sodium chloride flush, ondansetron (ZOFRAN) IV, oxyCODONE, polyethylene glycol, promethazine, tiZANidine  Consultants:  None  Procedures:  None  Antibiotics:  None  Assessment/Plan: Active Problems:   Sickle cell pain crisis (HCC)  1. Hb Sickle Cell Disease with crisis: Reduce IVF to 50 mls/hour, continue weight based Dilaudid PCA, continue IV Toradol 15 mg Q 6 H, Monitor vitals very closely, Re-evaluate pain scale regularly, 2 L of Oxygen by [place]. 2. GERD: Protonix, add sucralfate 3. Sickle Cell Anemia: Hemoglobin is stable at baseline, no clinical indication for blood transfusion today. 4. Chronic pain Syndrome: Continue home pain medications.  Code Status: Full Code Family Communication: N/A Disposition Plan: Not yet ready for discharge  Onesty Clair  If 7PM-7AM, please contact night-coverage.  05/15/2020, 2:25 PM  LOS: 1 day

## 2020-05-16 ENCOUNTER — Other Ambulatory Visit: Payer: Self-pay | Admitting: Nurse Practitioner

## 2020-05-16 ENCOUNTER — Telehealth: Payer: Self-pay | Admitting: Nurse Practitioner

## 2020-05-16 DIAGNOSIS — R600 Localized edema: Secondary | ICD-10-CM

## 2020-05-16 DIAGNOSIS — G894 Chronic pain syndrome: Secondary | ICD-10-CM

## 2020-05-16 DIAGNOSIS — D5720 Sickle-cell/Hb-C disease without crisis: Secondary | ICD-10-CM

## 2020-05-16 DIAGNOSIS — Z79891 Long term (current) use of opiate analgesic: Secondary | ICD-10-CM

## 2020-05-16 LAB — CBC WITH DIFFERENTIAL/PLATELET
Abs Immature Granulocytes: 0.03 10*3/uL (ref 0.00–0.07)
Basophils Absolute: 0.1 10*3/uL (ref 0.0–0.1)
Basophils Relative: 1 %
Eosinophils Absolute: 1.3 10*3/uL — ABNORMAL HIGH (ref 0.0–0.5)
Eosinophils Relative: 13 %
HCT: 27.9 % — ABNORMAL LOW (ref 36.0–46.0)
Hemoglobin: 10 g/dL — ABNORMAL LOW (ref 12.0–15.0)
Immature Granulocytes: 0 %
Lymphocytes Relative: 44 %
Lymphs Abs: 4.3 10*3/uL — ABNORMAL HIGH (ref 0.7–4.0)
MCH: 27.2 pg (ref 26.0–34.0)
MCHC: 35.8 g/dL (ref 30.0–36.0)
MCV: 75.8 fL — ABNORMAL LOW (ref 80.0–100.0)
Monocytes Absolute: 0.7 10*3/uL (ref 0.1–1.0)
Monocytes Relative: 7 %
Neutro Abs: 3.5 10*3/uL (ref 1.7–7.7)
Neutrophils Relative %: 35 %
Platelets: 397 10*3/uL (ref 150–400)
RBC: 3.68 MIL/uL — ABNORMAL LOW (ref 3.87–5.11)
RDW: 13.4 % (ref 11.5–15.5)
WBC: 9.9 10*3/uL (ref 4.0–10.5)
nRBC: 1.5 % — ABNORMAL HIGH (ref 0.0–0.2)

## 2020-05-16 LAB — BASIC METABOLIC PANEL
Anion gap: 8 (ref 5–15)
BUN: 7 mg/dL (ref 6–20)
CO2: 27 mmol/L (ref 22–32)
Calcium: 8.9 mg/dL (ref 8.9–10.3)
Chloride: 105 mmol/L (ref 98–111)
Creatinine, Ser: 0.64 mg/dL (ref 0.44–1.00)
GFR calc Af Amer: >60 mL/min (ref >60)
GFR calc non Af Amer: >60 mL/min (ref >60)
Glucose, Bld: 118 mg/dL — ABNORMAL HIGH (ref 70–99)
Potassium: 4 mmol/L (ref 3.5–5.1)
Sodium: 140 mmol/L (ref 135–145)

## 2020-05-16 MED ORDER — SUCRALFATE 1 GM/10ML PO SUSP
1.0000 g | Freq: Four times a day (QID) | ORAL | 1 refills | Status: DC
Start: 2020-05-16 — End: 2020-09-25

## 2020-05-16 MED ORDER — MORPHINE SULFATE ER 30 MG PO TBCR: 30.0000 mg | EXTENDED_RELEASE_TABLET | Freq: Two times a day (BID) | ORAL | 0 refills | Status: DC

## 2020-05-16 MED ORDER — LIDOCAINE 5 % EX PTCH: 1.0000 | MEDICATED_PATCH | CUTANEOUS | 2 refills | Status: DC

## 2020-05-16 MED ORDER — OXYCODONE HCL 15 MG PO TABS: 15.0000 mg | ORAL_TABLET | ORAL | 0 refills | Status: DC | PRN

## 2020-05-16 MED ORDER — TIZANIDINE HCL 2 MG PO TABS: 2.0000 mg | ORAL_TABLET | Freq: Three times a day (TID) | ORAL | 2 refills | Status: DC | PRN

## 2020-05-16 NOTE — Discharge Instructions (Signed)
Sickle Cell Anemia, Adult  Sickle cell anemia is a condition where your red blood cells are shaped like sickles. Red blood cells carry oxygen through the body. Sickle-shaped cells do not live as long as normal red blood cells. They also clump together and block blood from flowing through the blood vessels. This prevents the body from getting enough oxygen. Sickle cell anemia causes organ damage and pain. It also increases the risk of infection. Follow these instructions at home: Medicines  Take over-the-counter and prescription medicines only as told by your doctor.  If you were prescribed an antibiotic medicine, take it as told by your doctor. Do not stop taking the antibiotic even if you start to feel better.  If you develop a fever, do not take medicines to lower the fever right away. Tell your doctor about the fever. Managing pain, stiffness, and swelling  Try these methods to help with pain: ? Use a heating pad. ? Take a warm bath. ? Distract yourself, such as by watching TV. Eating and drinking  Drink enough fluid to keep your pee (urine) clear or pale yellow. Drink more in hot weather and during exercise.  Limit or avoid alcohol.  Eat a healthy diet. Eat plenty of fruits, vegetables, whole grains, and lean protein.  Take vitamins and supplements as told by your doctor. Traveling  When traveling, keep these with you: ? Your medical information. ? The names of your doctors. ? Your medicines.  If you need to take an airplane, talk to your doctor first. Activity  Rest often.  Avoid exercises that make your heart beat much faster, such as jogging. General instructions  Do not use products that have nicotine or tobacco, such as cigarettes and e-cigarettes. If you need help quitting, ask your doctor.  Consider wearing a medical alert bracelet.  Avoid being in high places (high altitudes), such as mountains.  Avoid very hot or cold temperatures.  Avoid places where the  temperature changes a lot.  Keep all follow-up visits as told by your doctor. This is important. Contact a doctor if:  A joint hurts.  Your feet or hands hurt or swell.  You feel tired (fatigued). Get help right away if:  You have symptoms of infection. These include: ? Fever. ? Chills. ? Being very tired. ? Irritability. ? Poor eating. ? Throwing up (vomiting).  You feel dizzy or faint.  You have new stomach pain, especially on the left side.  You have a an erection (priapism) that lasts more than 4 hours.  You have numbness in your arms or legs.  You have a hard time moving your arms or legs.  You have trouble talking.  You have pain that does not go away when you take medicine.  You are short of breath.  You are breathing fast.  You have a long-term cough.  You have pain in your chest.  You have a bad headache.  You have a stiff neck.  Your stomach looks bloated even though you did not eat much.  Your skin is pale.  You suddenly cannot see well. Summary  Sickle cell anemia is a condition where your red blood cells are shaped like sickles.  Follow your doctor's advice on ways to manage pain, food to eat, activities to do, and steps to take for safe travel.  Get medical help right away if you have any signs of infection, such as a fever. This information is not intended to replace advice given to you by   your health care provider. Make sure you discuss any questions you have with your health care provider. Document Revised: 03/30/2019 Document Reviewed: 01/11/2017 Elsevier Patient Education  2020 [employer]  

## 2020-05-16 NOTE — Telephone Encounter (Signed)
Pt states they have been discharged. Can you look again?

## 2020-05-16 NOTE — Telephone Encounter (Signed)
She is in the hospital currently

## 2020-05-16 NOTE — Telephone Encounter (Signed)
Pt wants a call back regarding possible medication change

## 2020-05-16 NOTE — Discharge Summary (Signed)
Physician Discharge Summary  Hannah Hawkins FTD:[id number] DOB: 09-16-1986 DOA: 05/13/2020  PCP: Vevelyn Francois, NP  Admit date: 05/13/2020  Discharge date: 05/16/2020  Discharge Diagnoses:  Active Problems:   Sickle cell pain crisis ([place])   Sickle cell anemia ([place])   Discharge Condition: Stable  Disposition:  Follow-up Information    Schedule an appointment as soon as possible for a visit  with Vevelyn Francois, NP.   Specialty: Adult Health Nurse Practitioner Contact information: [address] [place] [id number] [phone]        [facility] DEPT.   Specialty: Emergency Medicine Why: If symptoms worsen Contact information: [place] [id number] [place] [phone]         Pt is discharged home in good condition and is to follow up with Vevelyn Francois, NP this week to have labs evaluated. Hannah Hawkins is instructed to increase activity slowly and balance with rest for the next few days, and use prescribed medication to complete treatment of pain  Diet: Regular Wt Readings from Last 3 Encounters:  05/15/20 63.2 kg  03/07/20 64.4 kg  02/22/20 65.3 kg    History of present illness:  Hannah Hawkins is a 34 y.o. female with a known history of sickle cell anemia, GERD, hypokalemia presents to the emergency department for evaluation of bilateral lower extremity, bilateral hand and low back pain which is consistent with previous sickle cell pain crises.  Patient ran out of her medications at home.  She usually follows with the sickle cell team..   Of note two weeks ago she was diagnosed with bronchitis, took antibiotics, inhaler and a medrol dose pak. She has a slight cough from that episode.   Patient denies fevers/chills, weakness, dizziness, chest pain, shortness of breath, N/V/C/D, abdominal pain, dysuria/frequency, changes in mental status.    Otherwise there has been no change  in status. Patient has been taking medication as prescribed and there has been no recent change in medication or diet.  No recent antibiotics.  There has been no recent illness, hospitalizations, travel or sick contacts.    EMS/ED Course: Patient received Dilaudid, D5 half-normal saline. Medical admission has been requested for further management of sickle cell pain crisis.  Hospital Course:  Patient was admitted for sickle cell pain crisis and managed appropriately with IVF, IV Dilaudid via PCA and IV Toradol, as well as other adjunct therapies per sickle cell pain management protocols. Patient's symptoms with controlled with above regimen, her pain slowly returned to baseline, hemoglobin remained stable at baseline throughout admission, she did not require any blood transfusion as a result.  Patient was tolerating p.o. intake and ambulating well on the hallway. She did not require lidocaine patches for pain in her lower back area. She remained hemodynamically stable throughout admission. As of today patient is doing really well, eating and drinking well with no restrictions and vital signs remained stable. Patient was discharged home today in a hemodynamically stable condition. She will follow-up with her PCP in the clinic within 1 week of this discharge. Patient was counseled extensively about nonpharmacologic means of pain control. Patient verbalized understanding and was appreciative of her care during this admission.  Discharge Exam: Vitals:   05/16/20 0949 05/16/20 1121  BP: 113/66   Pulse: 85   Resp: 14 10  Temp: 98.3 F (36.8 C)   SpO2: 98% 98%   Vitals:   05/16/20 0821 05/16/20 0841 05/16/20 0949 05/16/20 1121  BP: (!) 126/95  113/66   Pulse: 82  85   Resp:  '19 14 10  ' Temp:   98.3 F (36.8 C)   TempSrc:   Oral   SpO2: 100% 97% 98% 98%  Weight:      Height:        General appearance : Awake, alert, not in any distress. Speech Clear. Not toxic looking HEENT: Atraumatic and  Normocephalic, pupils equally reactive to light and accomodation Neck: Supple, no JVD. No cervical lymphadenopathy.  Chest: Good air entry bilaterally, no added sounds  CVS: S1 S2 regular, no murmurs.  Abdomen: Bowel sounds present, Non tender and not distended with no gaurding, rigidity or rebound. Extremities: B/L Lower Ext shows no edema, both legs are warm to touch Neurology: Awake alert, and oriented X 3, CN II-XII intact, Non focal Skin: No Rash  Discharge Instructions  Discharge Instructions    Diet - low sodium heart healthy   Complete by: As directed    Increase activity slowly   Complete by: As directed      Allergies as of 05/16/2020      Reactions   Penicillins Swelling, Rash   Has patient had a PCN reaction causing immediate rash, facial/tongue/throat swelling, SOB or lightheadedness with hypotension: Yes Has patient had a PCN reaction causing severe rash involving mucus membranes or skin necrosis: Yes Has patient had a PCN reaction that required hospitalization No Has patient had a PCN reaction occurring within the last 10 years: no If all of the above answers are "NO", then may proceed with Cephalosporin use.      Medication List    TAKE these medications   albuterol 108 (90 Base) MCG/ACT inhaler Commonly known as: VENTOLIN HFA Inhale 2 puffs into the lungs every 6 (six) hours as needed for wheezing or shortness of breath.   azithromycin 250 MG tablet Commonly known as: ZITHROMAX 2 tabs x 1 day then daily x 4 times   benzonatate 100 MG capsule Commonly known as: TESSALON Take 1 capsule (100 mg total) by mouth 3 (three) times daily as needed for cough. Never suck or chew on a benzonatate capsule.   Cholecalciferol 125 MCG (5000 UT) capsule Take 1 capsule (5,000 Units total) by mouth daily.   citalopram 10 MG tablet Commonly known as: CeleXA Take 1 tablet (10 mg total) by mouth daily for 14 days, THEN 2 tablets (20 mg total) daily. Start taking on: Apr 28, 2020   ergocalciferol 1.25 MG (50000 UT) capsule Commonly known as: VITAMIN D2 Take 1 capsule (50,000 Units total) by mouth once a week.   guaiFENesin-codeine 100-10 MG/5ML syrup Take 5 mLs by mouth 3 (three) times daily as needed for cough.   hydroxyurea 500 MG capsule Commonly known as: HYDREA TAKE 2 CAPSULES(1000 MG) BY MOUTH DAILY What changed: See the new instructions.   levocetirizine 5 MG tablet Commonly known as: XYZAL Take 5 mg by mouth every evening.   lidocaine 5 % Commonly known as: Lidoderm Place 1 patch onto the skin daily. Remove & Discard patch within 12 hours or as directed by MD   montelukast 10 MG tablet Commonly known as: SINGULAIR Take 1 tablet (10 mg total) by mouth at bedtime.   morphine 30 MG 12 hr tablet Commonly known as: MS CONTIN Take 1 tablet (30 mg total) by mouth every 12 (twelve) hours. Must last 30 days.   Narcan 4 MG/0.1ML Liqd nasal spray kit Generic drug: naloxone Place 4 mg into the  nose once as needed (overdose).   ondansetron 4 MG tablet Commonly known as: ZOFRAN Take 4 mg by mouth every 8 (eight) hours as needed for nausea or vomiting.   oxyCODONE 15 MG immediate release tablet Commonly known as: ROXICODONE Take 1 tablet (15 mg total) by mouth every 4 (four) hours as needed for up to 15 days for pain (moderate to severe pain).   pantoprazole 40 MG tablet Commonly known as: PROTONIX Take 1 tablet (40 mg total) by mouth daily.   promethazine 12.5 MG tablet Commonly known as: PHENERGAN Take 1 tablet (12.5 mg total) by mouth every 4 (four) hours as needed for nausea or vomiting.   sucralfate 1 GM/10ML suspension Commonly known as: Carafate Take 10 mLs (1 g total) by mouth 4 (four) times daily for 21 days.   tiZANidine 2 MG tablet Commonly known as: ZANAFLEX Take 1 tablet (2 mg total) by mouth every 8 (eight) hours as needed for muscle spasms.       The results of significant diagnostics from this hospitalization  (including imaging, microbiology, ancillary and laboratory) are listed below for reference.    Significant Diagnostic Studies: DG Chest 2 View  Result Date: 05/15/2020 CLINICAL DATA:  Sickle cell pain. EXAM: CHEST - 2 VIEW COMPARISON:  January 04, 2020 FINDINGS: The heart size and mediastinal contours are within normal limits. Both lungs are clear. The visualized skeletal structures are unremarkable. IMPRESSION: No active cardiopulmonary disease. Electronically Signed   By: Constance Holster M.D.   On: 05/15/2020 17:04    Microbiology: Recent Results (from the past 240 hour(s))  SARS Coronavirus 2 by RT PCR (hospital order, performed in [facility] hospital lab) Nasopharyngeal Nasopharyngeal Swab     Status: None   Collection Time: 05/14/20  1:04 AM   Specimen: Nasopharyngeal Swab  Result Value Ref Range Status   SARS Coronavirus 2 NEGATIVE NEGATIVE Final    Comment: (NOTE) SARS-CoV-2 target nucleic acids are NOT DETECTED. The SARS-CoV-2 RNA is generally detectable in upper and lower respiratory specimens during the acute phase of infection. The lowest concentration of SARS-CoV-2 viral copies this assay can detect is 250 copies / mL. A negative result does not preclude SARS-CoV-2 infection and should not be used as the sole basis for treatment or other patient management decisions.  A negative result may occur with improper specimen collection / handling, submission of specimen other than nasopharyngeal swab, presence of viral mutation(s) within the areas targeted by this assay, and inadequate number of viral copies (<250 copies / mL). A negative result must be combined with clinical observations, patient history, and epidemiological information. Fact Sheet for Patients:   StrictlyIdeas.no Fact Sheet for Healthcare Providers: BankingDealers.co.za This test is not yet approved or cleared  by the [place] FDA and has been authorized  for detection and/or diagnosis of SARS-CoV-2 by FDA under an Emergency Use Authorization (EUA).  This EUA will remain in effect (meaning this test can be used) for the duration of the COVID-19 declaration under Section 564(b)(1) of the Act, 21 U.S.C. section 360bbb-3(b)(1), unless the authorization is terminated or revoked sooner. Performed at [facility], [place] [address] [place], [place] [address]      Labs: Basic Metabolic Panel: Recent Labs  Lab 05/12/20 0955 05/12/20 0955 05/13/20 2143 05/13/20 2143 05/14/20 0505 05/14/20 0508 05/16/20 1015  NA 140  --  136  --  138  --  140  K 3.3*   < > 5.9*   < > 4.0  --  4.0  CL 103  --  102  --  104  --  105  CO2 28  --  27  --  24  --  27  GLUCOSE 97  --  102*  --  104*  --  118*  BUN 7  --  8  --  9  --  7  CREATININE 0.72  --  0.87  --  0.68 0.72 0.64  CALCIUM 8.7*  --  9.1  --  8.4*  --  8.9   < > = values in this interval not displayed.   Liver Function Tests: Recent Labs  Lab 05/12/20 0955  AST 25  ALT 18  ALKPHOS 48  BILITOT 0.3  PROT 6.8  ALBUMIN 4.0   No results for input(s): LIPASE, AMYLASE in the last 168 hours. No results for input(s): AMMONIA in the last 168 hours. CBC: Recent Labs  Lab 05/12/20 0955 05/13/20 2143 05/14/20 0508 05/16/20 1015  WBC 10.1 7.5 10.5 9.9  NEUTROABS 4.0 4.4  --  3.5  HGB 10.4* 11.6* 10.5* 10.0*  HCT 28.8* 31.7* 28.7* 27.9*  MCV 78.0* 76.6* 76.9* 75.8*  PLT 396 458* 397 397   Cardiac Enzymes: No results for input(s): CKTOTAL, CKMB, CKMBINDEX, TROPONINI in the last 168 hours. BNP: Invalid input(s): POCBNP CBG: No results for input(s): GLUCAP in the last 168 hours.  Time coordinating discharge: 50 minutes  Signed:  [facility] 05/16/2020, 11:31 AM

## 2020-05-16 NOTE — Telephone Encounter (Signed)
Pt questioning about medications sent to the pharmacy Rx.patch and oxycodone--which already talk to [facility] and medication should be ready in 1 hour. In addition, pt requesting for  Rx compression stocking. Please advise.

## 2020-05-16 NOTE — Telephone Encounter (Signed)
Compression stockings sent

## 2020-05-16 NOTE — Telephone Encounter (Signed)
Pt called in refills on her oxycodone 15mg  immediate release, morphine, tizanidine, and lidocaine. Pt states she is leaving hospital today.

## 2020-05-28 ENCOUNTER — Ambulatory Visit (INDEPENDENT_AMBULATORY_CARE_PROVIDER_SITE_OTHER): Payer: Medicaid Other | Admitting: Nurse Practitioner

## 2020-05-28 ENCOUNTER — Encounter: Payer: Self-pay | Admitting: Nurse Practitioner

## 2020-05-28 ENCOUNTER — Other Ambulatory Visit: Payer: Self-pay

## 2020-05-28 VITALS — BP 126/84 | HR 76 | Temp 98.1°F | Ht 62.0 in | Wt 143.0 lb

## 2020-05-28 DIAGNOSIS — Z79891 Long term (current) use of opiate analgesic: Secondary | ICD-10-CM

## 2020-05-28 DIAGNOSIS — F1491 Cocaine use, unspecified, in remission: Secondary | ICD-10-CM

## 2020-05-28 DIAGNOSIS — D5720 Sickle-cell/Hb-C disease without crisis: Secondary | ICD-10-CM

## 2020-05-28 DIAGNOSIS — R600 Localized edema: Secondary | ICD-10-CM | POA: Diagnosis not present

## 2020-05-28 DIAGNOSIS — Z87898 Personal history of other specified conditions: Secondary | ICD-10-CM

## 2020-05-28 DIAGNOSIS — G894 Chronic pain syndrome: Secondary | ICD-10-CM

## 2020-05-28 LAB — POCT URINALYSIS DIPSTICK
Bilirubin, UA: NEGATIVE
Blood, UA: NEGATIVE
Glucose, UA: NEGATIVE
Ketones, UA: NEGATIVE
Leukocytes, UA: NEGATIVE
Nitrite, UA: NEGATIVE
Protein, UA: NEGATIVE
Spec Grav, UA: >=1.03 — AB (ref 1.010–1.025)
Urobilinogen, UA: 0.2 E.U./dL
pH, UA: 6 (ref 5.0–8.0)

## 2020-05-28 MED ORDER — IBUPROFEN 800 MG PO TABS: 800.0000 mg | ORAL_TABLET | Freq: Three times a day (TID) | ORAL | 2 refills | Status: AC | PRN

## 2020-05-28 NOTE — Patient Instructions (Addendum)
Plantar Fasciitis  Plantar fasciitis is a painful foot condition that affects the heel. It occurs when the band of tissue that connects the toes to the heel bone (plantar fascia) becomes irritated. This can happen as the result of exercising too much or doing other repetitive activities (overuse injury). The pain from plantar fasciitis can range from mild irritation to severe pain that makes it difficult to walk or move. The pain is usually worse in the morning after sleeping, or after sitting or lying down for a while. Pain may also be worse after long periods of walking or standing. What are the causes? This condition may be caused by:  Standing for long periods of time.  Wearing shoes that do not have good arch support.  Doing activities that put stress on joints (high-impact activities), including running, aerobics, and ballet.  Being overweight.  An abnormal way of walking (gait).  Tight muscles in the back of your lower leg (calf).  High arches in your feet.  Starting a new athletic activity. What are the signs or symptoms? The main symptom of this condition is heel pain. Pain may:  Be worse with first steps after a time of rest, especially in the morning after sleeping or after you have been sitting or lying down for a while.  Be worse after long periods of standing still.  Decrease after 30-45 minutes of activity, such as gentle walking. How is this diagnosed? This condition may be diagnosed based on your medical history and your symptoms. Your health care provider may ask questions about your activity level. Your health care provider will do a physical exam to check for:  A tender area on the bottom of your foot.  A high arch in your foot.  Pain when you move your foot.  Difficulty moving your foot. You may have imaging tests to confirm the diagnosis, such as:  X-rays.  Ultrasound.  MRI. How is this treated? Treatment for plantar fasciitis depends on how  severe your condition is. Treatment may include:  Rest, ice, applying pressure (compression), and raising the affected foot (elevation). This may be called RICE therapy. Your health care provider may recommend RICE therapy along with over-the-counter pain medicines to manage your pain.  Exercises to stretch your calves and your plantar fascia.  A splint that holds your foot in a stretched, upward position while you sleep (night splint).  Physical therapy to relieve symptoms and prevent problems in the future.  Injections of steroid medicine (cortisone) to relieve pain and inflammation.  Stimulating your plantar fascia with electrical impulses (extracorporeal shock wave therapy). This is usually the last treatment option before surgery.  Surgery, if other treatments have not worked after 12 months. Follow these instructions at home:  Managing pain, stiffness, and swelling  If directed, put ice on the painful area: ? Put ice in a plastic bag, or use a frozen bottle of water. ? Place a towel between your skin and the bag or bottle. ? Roll the bottom of your foot over the bag or bottle. ? Do this for 20 minutes, 2-3 times a day.  Plantar Fasciitis Rehab Ask your health care provider which exercises are safe for you. Do exercises exactly as told by your health care provider and adjust them as directed. It is normal to feel mild stretching, pulling, tightness, or discomfort as you do these exercises. Stop right away if you feel sudden pain or your pain gets worse. Do not begin these exercises until told by  your health care provider. Stretching and range-of-motion exercises These exercises warm up your muscles and joints and improve the movement and flexibility of your foot. These exercises also help to relieve pain. Plantar fascia stretch  Sit with your left / right leg crossed over your opposite knee. Hold your heel with one hand with that thumb near your arch. With your other hand, hold  your toes and gently pull them back toward the top of your foot. You should feel a stretch on the bottom of your toes or your foot (plantar fascia) or both. Hold this stretch for__________ seconds. Slowly release your toes and return to the starting position. Repeat __________ times. Complete this exercise __________ times a day. Gastrocnemius stretch, standing This exercise is also called a calf (gastroc) stretch. It stretches the muscles in the back of the upper calf. Stand with your hands against a wall. Extend your left / right leg behind you, and bend your front knee slightly. Keeping your heels on the floor and your back knee straight, shift your weight toward the wall. Do not arch your back. You should feel a gentle stretch in your upper left / right calf. Hold this position for __________ seconds. Repeat __________ times. Complete this exercise __________ times a day. Soleus stretch, standing This exercise is also called a calf (soleus) stretch. It stretches the muscles in the back of the lower calf. Stand with your hands against a wall. Extend your left / right leg behind you, and bend your front knee slightly. Keeping your heels on the floor, bend your back knee and shift your weight slightly over your back leg. You should feel a gentle stretch deep in your lower calf. Hold this position for __________ seconds. Repeat __________ times. Complete this exercise __________ times a day. Gastroc and soleus stretch, standing step This exercise stretches the muscles in the back of the lower leg. These muscles are in the upper calf (gastrocnemius) and the lower calf (soleus). Stand with the ball of your left / right foot on a step. The ball of your foot is on the walking surface, right under your toes. Keep your other foot firmly on the same step. Hold on to the wall or a railing for balance. Slowly lift your other foot, allowing your body weight to press your left / right heel down over the  edge of the step. You should feel a stretch in your left / right calf. Hold this position for __________ seconds. Return both feet to the step. Repeat this exercise with a slight bend in your left / right knee. Repeat __________ times with your left / right knee straight and __________ times with your left / right knee bent. Complete this exercise __________ times a day. Balance exercise This exercise builds your balance and strength control of your arch to help take pressure off your plantar fascia. Single leg stand If this exercise is too easy, you can try it with your eyes closed or while standing on a pillow. Without shoes, stand near a railing or in a doorway. You may hold on to the railing or door frame as needed. Stand on your left / right foot. Keep your big toe down on the floor and try to keep your arch lifted. Do not let your foot roll inward. Hold this position for __________ seconds. Repeat __________ times. Complete this exercise __________ times a day. This information is not intended to replace advice given to you by your health care provider. Make sure  you discuss any questions you have with your health care provider. Document Revised: 03/29/2019 Document Reviewed: 10/04/2018 Elsevier Patient Education  [place]bra athletic shoes that have air-sole or gel-sole cushions, or try wearing soft shoe inserts that are designed for plantar fasciitis.  Raise (elevate) your foot above the level of your heart while you are sitting or lying down. Activity  Avoid activities that cause pain. Ask your health care provider what activities are safe for you.  Do physical therapy exercises and stretches as told by your health care provider.  Try activities and forms of exercise that are easier on your joints (low-impact). Examples include swimming, water aerobics, and biking. General instructions  Take over-the-counter and prescription medicines only as told by your health  care provider.  Wear a night splint while sleeping, if told by your health care provider. Loosen the splint if your toes tingle, become numb, or turn cold and blue.  Maintain a healthy weight, or work with your health care provider to lose weight as needed.  Keep all follow-up visits as told by your health care provider. This is important. Contact a health care provider if you:  Have symptoms that do not go away after caring for yourself at home.  Have pain that gets worse.  Have pain that affects your ability to move or do your daily activities. Summary  Plantar fasciitis is a painful foot condition that affects the heel. It occurs when the band of tissue that connects the toes to the heel bone (plantar fascia) becomes irritated.  The main symptom of this condition is heel pain that may be worse after exercising too much or standing still for a long time.  Treatment varies, but it usually starts with rest, ice, compression, and elevation (RICE therapy) and over-the-counter medicines to manage pain. This information is not intended to replace advice given to you by your health care provider. Make sure you discuss any questions you have with your health care provider. Document Revised: 11/18/2017 Document Reviewed: 10/03/2017 Elsevier Patient Education  2020 [employer].

## 2020-05-28 NOTE — Progress Notes (Signed)
[place], [place]  [address] Phone:  [phone]   Fax:  [phone]    Established Patient Office Visit  Subjective:  Patient ID: Hannah Hawkins, female    DOB: 1986-11-02  Age: 34 y.o. MRN: [id number]  CC:  Chief Complaint  Patient presents with  . Follow-up    follow up  sickle cell. pain hands swelling feet     HPI Hannah Hawkins presents for follow-up.  She  has a past medical history of Sickle cell anemia ([place]).   Edema Patient complains of edema in Both hands, ankles and feet. The edema has been moderate. Onset of symptoms was several months ago, and patient reports symptoms have stabilized since that time. The edema is present intermittently. The patient states the problem is long-standing. The swelling has been aggravated by dependency of involved area. The swelling has been relieved by nothing. Associated factors include: nothing. Cardiac risk factors include sedentary lifestyle.  Denies fever, headache, cough, wheezing, shortness of breath, chest pains, abdominal pain, back pain, hip pain, or leg pain. Denies any open wounds, skin irritation.  She is very inactive.  She feels like any activity causes her pain.  She also admits that she does not take the hydroxyurea.  She has not taken it since October.   Past Medical History:  Diagnosis Date  . Sickle cell anemia (HCC)     Past Surgical History:  Procedure Laterality Date  . EYE SURGERY  03/09/2017   left eye  . EYE SURGERY Right 09/2019   right hemerage     Family History  Problem Relation Age of Onset  . Diabetes Father     Social History   Socioeconomic History  . Marital status: Single    Spouse name: Not on file  . Number of children: Not on file  . Years of education: Not on file  . Highest education level: Not on file  Occupational History  . Not on file  Tobacco Use  . Smoking status: Former Smoker    Types: Cigarettes  . Smokeless  tobacco: Never Used  Vaping Use  . Vaping Use: Never used  Substance and Sexual Activity  . Alcohol use: Not Currently  . Drug use: No  . Sexual activity: Yes  Other Topics Concern  . Not on file  Social History Narrative  . Not on file   Social Determinants of Health   Financial Resource Strain:   . Difficulty of Paying Living Expenses:   Food Insecurity:   . Worried About Charity fundraiser in the Last Year:   . Arboriculturist in the Last Year:   Transportation Needs:   . Film/video editor (Medical):   Marland Kitchen Lack of Transportation (Non-Medical):   Physical Activity:   . Days of Exercise per Week:   . Minutes of Exercise per Session:   Stress:   . Feeling of Stress :   Social Connections:   . Frequency of Communication with Friends and Family:   . Frequency of Social Gatherings with Friends and Family:   . Attends Religious Services:   . Active Member of Clubs or Organizations:   . Attends Archivist Meetings:   Marland Kitchen Marital Status:   Intimate Partner Violence:   . Fear of Current or Ex-Partner:   . Emotionally Abused:   Marland Kitchen Physically Abused:   . Sexually Abused:     Outpatient Medications Prior to Visit  Medication  Sig Dispense Refill  . albuterol (VENTOLIN HFA) 108 (90 Base) MCG/ACT inhaler Inhale 2 puffs into the lungs every 6 (six) hours as needed for wheezing or shortness of breath. 8 g 0  . citalopram (CELEXA) 10 MG tablet Take 1 tablet (10 mg total) by mouth daily for 14 days, THEN 2 tablets (20 mg total) daily. 90 tablet 0  . ergocalciferol (VITAMIN D2) 1.25 MG (50000 UT) capsule Take 1 capsule (50,000 Units total) by mouth once a week. (Patient not taking: Reported on 05/02/2020) 12 capsule 3  . levocetirizine (XYZAL) 5 MG tablet Take 5 mg by mouth every evening.    . lidocaine (LIDODERM) 5 % Place 1 patch onto the skin daily. Remove & Discard patch within 12 hours or as directed by MD 30 patch 2  . montelukast (SINGULAIR) 10 MG tablet Take 1 tablet  (10 mg total) by mouth at bedtime. 30 tablet 3  . morphine (MS CONTIN) 30 MG 12 hr tablet Take 1 tablet (30 mg total) by mouth every 12 (twelve) hours. Must last 30 days. 60 tablet 0  . NARCAN 4 MG/0.1ML LIQD nasal spray kit Place 4 mg into the nose once as needed (overdose).   0  . ondansetron (ZOFRAN) 4 MG tablet Take 4 mg by mouth every 8 (eight) hours as needed for nausea or vomiting.    . pantoprazole (PROTONIX) 40 MG tablet Take 1 tablet (40 mg total) by mouth daily. 30 tablet 3  . promethazine (PHENERGAN) 12.5 MG tablet Take 1 tablet (12.5 mg total) by mouth every 4 (four) hours as needed for nausea or vomiting. 30 tablet 0  . sucralfate (CARAFATE) 1 GM/10ML suspension Take 10 mLs (1 g total) by mouth 4 (four) times daily for 21 days. 420 mL 1  . tiZANidine (ZANAFLEX) 2 MG tablet Take 1 tablet (2 mg total) by mouth every 8 (eight) hours as needed for muscle spasms. 45 tablet 2  . azithromycin (ZITHROMAX) 250 MG tablet 2 tabs x 1 day then daily x 4 times (Patient not taking: Reported on 05/13/2020) 6 tablet 0  . benzonatate (TESSALON) 100 MG capsule Take 1 capsule (100 mg total) by mouth 3 (three) times daily as needed for cough. Never suck or chew on a benzonatate capsule. (Patient not taking: Reported on 05/13/2020) 30 capsule 2  . guaiFENesin-codeine 100-10 MG/5ML syrup Take 5 mLs by mouth 3 (three) times daily as needed for cough. (Patient not taking: Reported on 05/02/2020) 118 mL 0  . hydroxyurea (HYDREA) 500 MG capsule TAKE 2 CAPSULES(1000 MG) BY MOUTH DAILY (Patient taking differently: Take 1,000 mg by mouth daily. ) 60 capsule 2  . oxyCODONE (ROXICODONE) 15 MG immediate release tablet Take 1 tablet (15 mg total) by mouth every 4 (four) hours as needed for up to 15 days for pain (moderate to severe pain). 60 tablet 0   No facility-administered medications prior to visit.    Allergies  Allergen Reactions  . Penicillins Swelling and Rash    Has patient had a PCN reaction causing  immediate rash, facial/tongue/throat swelling, SOB or lightheadedness with hypotension: Yes Has patient had a PCN reaction causing severe rash involving mucus membranes or skin necrosis: Yes Has patient had a PCN reaction that required hospitalization No Has patient had a PCN reaction occurring within the last 10 years: no If all of the above answers are "NO", then may proceed with Cephalosporin use.     ROS Review of Systems  Musculoskeletal: Positive for neck pain and  neck stiffness.      Objective:    Physical Exam Constitutional:      General: She is not in acute distress.    Appearance: Normal appearance. She is normal weight. She is not ill-appearing or toxic-appearing.  HENT:     Head: Normocephalic.     Nose: Nose normal.     Mouth/Throat:     Mouth: Mucous membranes are moist.     Pharynx: Oropharynx is clear.  Neck:     Comments: Posterior Cardiovascular:     Rate and Rhythm: Normal rate and regular rhythm.     Pulses: Normal pulses.     Heart sounds: Normal heart sounds.  Pulmonary:     Effort: Pulmonary effort is normal.     Breath sounds: Normal breath sounds.  Abdominal:     General: Abdomen is flat. Bowel sounds are normal.     Palpations: Abdomen is soft.  Musculoskeletal:        General: Swelling and tenderness present.     Comments: Bilateral fingers and ankles  Lymphadenopathy:     Cervical: Cervical adenopathy present.  Skin:    General: Skin is warm and dry.  Neurological:     General: No focal deficit present.     Mental Status: She is alert and oriented to person, place, and time.  Psychiatric:        Mood and Affect: Mood normal.        Behavior: Behavior normal.        Thought Content: Thought content normal.        Judgment: Judgment normal.     BP 126/84 (BP Location: Left Arm)   Pulse 76   Temp 98.1 F (36.7 C) (Temporal)   Ht '5\' 2"'  (1.575 m)   Wt 143 lb (64.9 kg)   LMP 05/03/2020   SpO2 100%   BMI 26.16 kg/m  Wt Readings  from Last 3 Encounters:  05/28/20 143 lb (64.9 kg)  05/15/20 139 lb 5.3 oz (63.2 kg)  03/07/20 142 lb (64.4 kg)     Health Maintenance Due  Topic Date Due  . COVID-19 Vaccine (1) Never done    There are no preventive care reminders to display for this patient.  Lab Results  Component Value Date   TSH 0.56 09/30/2017   Lab Results  Component Value Date   WBC 9.9 05/16/2020   HGB 10.0 (L) 05/16/2020   HCT 27.9 (L) 05/16/2020   MCV 75.8 (L) 05/16/2020   PLT 397 05/16/2020   Lab Results  Component Value Date   NA 140 05/16/2020   K 4.0 05/16/2020   CO2 27 05/16/2020   GLUCOSE 118 (H) 05/16/2020   BUN 7 05/16/2020   CREATININE 0.64 05/16/2020   BILITOT 0.3 05/12/2020   ALKPHOS 48 05/12/2020   AST 25 05/12/2020   ALT 18 05/12/2020   PROT 6.8 05/12/2020   ALBUMIN 4.0 05/12/2020   CALCIUM 8.9 05/16/2020   ANIONGAP 8 05/16/2020   No results found for: CHOL No results found for: HDL No results found for: LDLCALC No results found for: TRIG No results found for: CHOLHDL No results found for: HGBA1C    Assessment & Plan:   Problem List Items Addressed This Visit      Other   Chronic pain syndrome   Relevant Medications   ibuprofen (ADVIL) 800 MG tablet   History of cocaine use   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 ibuprofen (ADVIL) 800 MG tablet    Sig: Take 1 tablet (800 mg total) by mouth every 8 (eight) hours as needed for moderate pain. Take on full stomach.    Dispense:  60 tablet    Refill:  2    Order Specific Question:   Supervising Provider    Answer:   Tresa Garter [id number]    Follow-up: Return in about 3 months (around 08/28/2020).     Vevelyn Francois, NP

## 2020-05-29 LAB — SEDIMENTATION RATE: Sed Rate: 2 mm/hr (ref 0–32)

## 2020-05-30 ENCOUNTER — Other Ambulatory Visit: Payer: Self-pay | Admitting: Nurse Practitioner

## 2020-05-30 ENCOUNTER — Telehealth: Payer: Self-pay | Admitting: Nurse Practitioner

## 2020-05-30 DIAGNOSIS — G894 Chronic pain syndrome: Secondary | ICD-10-CM

## 2020-05-30 DIAGNOSIS — D5720 Sickle-cell/Hb-C disease without crisis: Secondary | ICD-10-CM

## 2020-05-30 MED ORDER — OXYCODONE HCL 15 MG PO TABS: 15.0000 mg | ORAL_TABLET | ORAL | 0 refills | Status: DC | PRN

## 2020-05-30 NOTE — Telephone Encounter (Signed)
Sent!

## 2020-06-04 ENCOUNTER — Telehealth (HOSPITAL_COMMUNITY): Payer: Self-pay | Admitting: *Deleted

## 2020-06-04 NOTE — Telephone Encounter (Signed)
Patient called requesting to come to the day hospital for sickle cell pain. Patient reports pain and edema in bilateral hands, feet and back rated 10/10. Reports taking Oxycodone 15 mg, MS Contin 30 mg and Ibuprofen at 6:00 am. COVID-19 screening done and patient denies all symptoms and exposures. Denies fever, chest pain, nausea, vomiting, diarrhea and abdominal pain. [place], FNP notified and advised patient to hydrate and continue to take medications around the clock as prescribed for pain management. Patient can call back to the day hospital tomorrow morning if pain is still uncontrolled. Provider advised patient to either follow up with PCP or ED for evaluation of edema.  Patient advised and expresses an understanding.

## 2020-06-09 ENCOUNTER — Other Ambulatory Visit: Payer: Self-pay | Admitting: Nurse Practitioner

## 2020-06-11 ENCOUNTER — Observation Stay (HOSPITAL_COMMUNITY)
Admission: EM | Admit: 2020-06-11 | Discharge: 2020-06-12 | Disposition: A | Discharge status: Home / Self Care | Payer: Medicaid Other | Attending: Family Medicine | Admitting: Family Medicine

## 2020-06-11 ENCOUNTER — Other Ambulatory Visit: Payer: Self-pay

## 2020-06-11 ENCOUNTER — Telehealth (HOSPITAL_COMMUNITY): Payer: Self-pay | Admitting: General Practice

## 2020-06-11 ENCOUNTER — Encounter (HOSPITAL_COMMUNITY): Payer: Self-pay | Admitting: Emergency Medicine

## 2020-06-11 DIAGNOSIS — D57219 Sickle-cell/Hb-C disease with crisis, unspecified: Principal | ICD-10-CM | POA: Insufficient documentation

## 2020-06-11 DIAGNOSIS — D5720 Sickle-cell/Hb-C disease without crisis: Secondary | ICD-10-CM

## 2020-06-11 DIAGNOSIS — G894 Chronic pain syndrome: Secondary | ICD-10-CM

## 2020-06-11 DIAGNOSIS — Z87891 Personal history of nicotine dependence: Secondary | ICD-10-CM | POA: Insufficient documentation

## 2020-06-11 DIAGNOSIS — D5700 Hb-SS disease with crisis, unspecified: Secondary | ICD-10-CM

## 2020-06-11 DIAGNOSIS — J329 Chronic sinusitis, unspecified: Secondary | ICD-10-CM | POA: Diagnosis not present

## 2020-06-11 DIAGNOSIS — D638 Anemia in other chronic diseases classified elsewhere: Secondary | ICD-10-CM | POA: Insufficient documentation

## 2020-06-11 DIAGNOSIS — Z20822 Contact with and (suspected) exposure to covid-19: Secondary | ICD-10-CM | POA: Insufficient documentation

## 2020-06-11 DIAGNOSIS — M25542 Pain in joints of left hand: Secondary | ICD-10-CM | POA: Diagnosis not present

## 2020-06-11 DIAGNOSIS — M25541 Pain in joints of right hand: Secondary | ICD-10-CM | POA: Diagnosis present

## 2020-06-11 DIAGNOSIS — F329 Major depressive disorder, single episode, unspecified: Secondary | ICD-10-CM | POA: Insufficient documentation

## 2020-06-11 DIAGNOSIS — Z79899 Other long term (current) drug therapy: Secondary | ICD-10-CM | POA: Insufficient documentation

## 2020-06-11 LAB — CBC WITH DIFFERENTIAL/PLATELET
Abs Immature Granulocytes: 0.03 10*3/uL (ref 0.00–0.07)
Basophils Absolute: 0.1 10*3/uL (ref 0.0–0.1)
Basophils Relative: 1 %
Eosinophils Absolute: 0.3 10*3/uL (ref 0.0–0.5)
Eosinophils Relative: 3 %
HCT: 30.9 % — ABNORMAL LOW (ref 36.0–46.0)
Hemoglobin: 11 g/dL — ABNORMAL LOW (ref 12.0–15.0)
Immature Granulocytes: 0 %
Lymphocytes Relative: 24 %
Lymphs Abs: 2.4 10*3/uL (ref 0.7–4.0)
MCH: 26.6 pg (ref 26.0–34.0)
MCHC: 35.6 g/dL (ref 30.0–36.0)
MCV: 74.6 fL — ABNORMAL LOW (ref 80.0–100.0)
Monocytes Absolute: 0.5 10*3/uL (ref 0.1–1.0)
Monocytes Relative: 5 %
Neutro Abs: 6.8 10*3/uL (ref 1.7–7.7)
Neutrophils Relative %: 67 %
Platelets: 394 10*3/uL (ref 150–400)
RBC: 4.14 MIL/uL (ref 3.87–5.11)
RDW: 13.7 % (ref 11.5–15.5)
WBC: 10.1 10*3/uL (ref 4.0–10.5)
nRBC: 0 % (ref 0.0–0.2)

## 2020-06-11 LAB — COMPREHENSIVE METABOLIC PANEL
ALT: 18 U/L (ref 0–44)
AST: 24 U/L (ref 15–41)
Albumin: 4.4 g/dL (ref 3.5–5.0)
Alkaline Phosphatase: 52 U/L (ref 38–126)
Anion gap: 7 (ref 5–15)
BUN: 9 mg/dL (ref 6–20)
CO2: 27 mmol/L (ref 22–32)
Calcium: 8.9 mg/dL (ref 8.9–10.3)
Chloride: 105 mmol/L (ref 98–111)
Creatinine, Ser: 0.71 mg/dL (ref 0.44–1.00)
GFR calc Af Amer: >60 mL/min (ref >60)
GFR calc non Af Amer: >60 mL/min (ref >60)
Glucose, Bld: 94 mg/dL (ref 70–99)
Potassium: 3.7 mmol/L (ref 3.5–5.1)
Sodium: 139 mmol/L (ref 135–145)
Total Bilirubin: 1 mg/dL (ref 0.3–1.2)
Total Protein: 7.9 g/dL (ref 6.5–8.1)

## 2020-06-11 LAB — I-STAT BETA HCG BLOOD, ED (MC, WL, AP ONLY): I-stat hCG, quantitative: <5 m[IU]/mL (ref <5)

## 2020-06-11 LAB — SARS CORONAVIRUS 2 BY RT PCR (HOSPITAL ORDER, PERFORMED IN ~~LOC~~ HOSPITAL LAB): SARS Coronavirus 2: NEGATIVE

## 2020-06-11 LAB — C-REACTIVE PROTEIN: CRP: 0.8 mg/dL (ref <1.0)

## 2020-06-11 LAB — TSH: TSH: 0.81 u[IU]/mL (ref 0.350–4.500)

## 2020-06-11 LAB — RETICULOCYTES
Immature Retic Fract: 29.7 % — ABNORMAL HIGH (ref 2.3–15.9)
RBC.: 4.08 MIL/uL (ref 3.87–5.11)
Retic Count, Absolute: 206.4 10*3/uL — ABNORMAL HIGH (ref 19.0–186.0)
Retic Ct Pct: 5.1 % — ABNORMAL HIGH (ref 0.4–3.1)

## 2020-06-11 LAB — CREATININE, SERUM
Creatinine, Ser: 0.7 mg/dL (ref 0.44–1.00)
GFR calc Af Amer: >60 mL/min (ref >60)
GFR calc non Af Amer: >60 mL/min (ref >60)

## 2020-06-11 MED ORDER — SODIUM CHLORIDE 0.9% FLUSH: 9.0000 mL | INTRAVENOUS | Status: DC | PRN

## 2020-06-11 MED ORDER — BISACODYL 5 MG PO TBEC: 5.0000 mg | DELAYED_RELEASE_TABLET | Freq: Every day | ORAL | Status: DC | PRN

## 2020-06-11 MED ORDER — ONDANSETRON HCL 4 MG/2ML IJ SOLN: 4.0000 mg | Freq: Four times a day (QID) | INTRAMUSCULAR | Status: DC | PRN

## 2020-06-11 MED ORDER — KETOROLAC TROMETHAMINE 15 MG/ML IJ SOLN
15.0000 mg | Freq: Four times a day (QID) | INTRAMUSCULAR | Status: DC
  Administered 2020-06-11 – 2020-06-12 (×3): 15 mg via INTRAVENOUS
  Filled 2020-06-11 (×3): qty 1

## 2020-06-11 MED ORDER — KETOROLAC TROMETHAMINE 15 MG/ML IJ SOLN
15.0000 mg | Freq: Once | INTRAMUSCULAR | Status: AC
  Administered 2020-06-11: 15 mg via INTRAVENOUS
  Filled 2020-06-11: qty 1

## 2020-06-11 MED ORDER — PROMETHAZINE HCL 25 MG PO TABS: 12.5000 mg | ORAL_TABLET | ORAL | Status: DC | PRN

## 2020-06-11 MED ORDER — HYDROMORPHONE HCL 2 MG/ML IJ SOLN
2.0000 mg | Freq: Once | INTRAMUSCULAR | Status: AC
  Administered 2020-06-11: 2 mg via INTRAVENOUS
  Filled 2020-06-11: qty 1

## 2020-06-11 MED ORDER — HYDROMORPHONE 1 MG/ML IV SOLN
INTRAVENOUS | Status: DC
  Administered 2020-06-11: 30 mg via INTRAVENOUS
  Administered 2020-06-12: 12 mg via INTRAVENOUS
  Administered 2020-06-12 (×2): 5.5 mg via INTRAVENOUS
  Administered 2020-06-12: 30 mg via INTRAVENOUS
  Administered 2020-06-12: 5.5 mg via INTRAVENOUS
  Filled 2020-06-11 (×2): qty 30

## 2020-06-11 MED ORDER — SODIUM CHLORIDE 0.9 % IV SOLN: 25.0000 mg | INTRAVENOUS | Status: DC | PRN

## 2020-06-11 MED ORDER — ENOXAPARIN SODIUM 40 MG/0.4ML ~~LOC~~ SOLN
40.0000 mg | SUBCUTANEOUS | Status: DC
  Administered 2020-06-12: 40 mg via SUBCUTANEOUS
  Filled 2020-06-11: qty 0.4

## 2020-06-11 MED ORDER — LORATADINE 10 MG PO TABS
10.0000 mg | ORAL_TABLET | Freq: Every evening | ORAL | Status: DC
  Administered 2020-06-11: 10 mg via ORAL
  Filled 2020-06-11: qty 1

## 2020-06-11 MED ORDER — DEXTROSE-NACL 5-0.45 % IV SOLN: INTRAVENOUS | Status: DC

## 2020-06-11 MED ORDER — TIZANIDINE HCL 4 MG PO TABS: 2.0000 mg | ORAL_TABLET | Freq: Three times a day (TID) | ORAL | Status: DC | PRN

## 2020-06-11 MED ORDER — DIPHENHYDRAMINE HCL 25 MG PO CAPS: 25.0000 mg | ORAL_CAPSULE | ORAL | Status: DC | PRN

## 2020-06-11 MED ORDER — MONTELUKAST SODIUM 10 MG PO TABS
10.0000 mg | ORAL_TABLET | Freq: Every day | ORAL | Status: DC
  Administered 2020-06-11: 10 mg via ORAL
  Filled 2020-06-11: qty 1

## 2020-06-11 MED ORDER — ONDANSETRON HCL 4 MG PO TABS: 4.0000 mg | ORAL_TABLET | Freq: Three times a day (TID) | ORAL | Status: DC | PRN

## 2020-06-11 MED ORDER — LIDOCAINE 5 % EX PTCH: 1.0000 | MEDICATED_PATCH | Freq: Two times a day (BID) | CUTANEOUS | Status: DC | PRN

## 2020-06-11 MED ORDER — FOLIC ACID 1 MG PO TABS
1.0000 mg | ORAL_TABLET | Freq: Every day | ORAL | Status: DC
  Administered 2020-06-11 – 2020-06-12 (×2): 1 mg via ORAL
  Filled 2020-06-11 (×2): qty 1

## 2020-06-11 MED ORDER — DIPHENHYDRAMINE HCL 50 MG/ML IJ SOLN
25.0000 mg | Freq: Once | INTRAMUSCULAR | Status: AC
  Administered 2020-06-11: 25 mg via INTRAVENOUS
  Filled 2020-06-11: qty 1

## 2020-06-11 MED ORDER — DOCUSATE SODIUM 100 MG PO CAPS
100.0000 mg | ORAL_CAPSULE | Freq: Two times a day (BID) | ORAL | Status: DC
  Filled 2020-06-11 (×2): qty 1

## 2020-06-11 MED ORDER — SODIUM CHLORIDE 0.9% FLUSH
3.0000 mL | Freq: Once | INTRAVENOUS | Status: AC
  Administered 2020-06-11: 3 mL via INTRAVENOUS

## 2020-06-11 MED ORDER — SENNA 8.6 MG PO TABS
1.0000 | ORAL_TABLET | Freq: Two times a day (BID) | ORAL | Status: DC
  Administered 2020-06-12: 8.6 mg via ORAL
  Filled 2020-06-11 (×2): qty 1

## 2020-06-11 MED ORDER — ACETAMINOPHEN 325 MG PO TABS
650.0000 mg | ORAL_TABLET | Freq: Four times a day (QID) | ORAL | Status: DC
  Administered 2020-06-11: 650 mg via ORAL
  Filled 2020-06-11 (×2): qty 2

## 2020-06-11 MED ORDER — SUCRALFATE 1 GM/10ML PO SUSP
1.0000 g | Freq: Four times a day (QID) | ORAL | Status: DC
  Administered 2020-06-11 – 2020-06-12 (×3): 1 g via ORAL
  Filled 2020-06-11 (×3): qty 10

## 2020-06-11 MED ORDER — ONDANSETRON HCL 4 MG/2ML IJ SOLN
4.0000 mg | Freq: Once | INTRAMUSCULAR | Status: AC
  Administered 2020-06-11: 4 mg via INTRAVENOUS
  Filled 2020-06-11: qty 2

## 2020-06-11 MED ORDER — ALBUTEROL SULFATE (2.5 MG/3ML) 0.083% IN NEBU: 2.5000 mg | INHALATION_SOLUTION | Freq: Four times a day (QID) | RESPIRATORY_TRACT | Status: DC | PRN

## 2020-06-11 MED ORDER — PANTOPRAZOLE SODIUM 40 MG PO TBEC
40.0000 mg | DELAYED_RELEASE_TABLET | Freq: Every day | ORAL | Status: DC
  Administered 2020-06-11 – 2020-06-12 (×2): 40 mg via ORAL
  Filled 2020-06-11 (×2): qty 1

## 2020-06-11 MED ORDER — NALOXONE HCL 0.4 MG/ML IJ SOLN: 0.4000 mg | INTRAMUSCULAR | Status: DC | PRN

## 2020-06-11 MED ORDER — CITALOPRAM HYDROBROMIDE 20 MG PO TABS
20.0000 mg | ORAL_TABLET | Freq: Every day | ORAL | Status: DC
  Administered 2020-06-11 – 2020-06-12 (×2): 20 mg via ORAL
  Filled 2020-06-11 (×2): qty 1

## 2020-06-11 NOTE — ED Provider Notes (Signed)
[facility] DEPT Provider Note   CSN: [id number] Arrival date & time: 06/11/20  1051     History Chief Complaint  Patient presents with  . Sickle Cell Pain Crisis    Hannah Hawkins is a 34 y.o. female.  The history is provided by the patient.  Sickle Cell Pain Crisis Location:  Diffuse Severity:  Severe Onset quality:  Gradual Duration:  24 hours Similar to previous crisis episodes: yes   Timing:  Constant Progression:  Worsening Chronicity:  Recurrent Sickle cell genotype:  [place] Date of last transfusion:  Years ago Frequency of attacks:  Monthly History of pulmonary emboli: no   Context comment:  Patient reports the weather changes often will cause attacks Relieved by:  Nothing Worsened by:  Activity and movement Ineffective treatments:  Prescription drugs and OTC medications Associated symptoms: nausea   Associated symptoms: no congestion, no cough, no fever, no shortness of breath, no swelling of legs, no vomiting and no wheezing   Associated symptoms comment:  Reports she continues to have intermittent swelling of her hands and feet but this has been going on now for months and they are not sure what is going on. Risk factors: frequent admissions for pain and frequent pain crises   Risk factors: no prior acute chest        Past Medical History:  Diagnosis Date  . Sickle cell anemia [facility])     Patient Active Problem List   Diagnosis Date Noted  . Sickle cell anemia (HCC)   . Hypokalemia 07/17/2019  . GERD (gastroesophageal reflux disease) 05/29/2019  . History of cocaine use 05/18/2019  . Chronic pain syndrome   . Sickle cell crisis ([place]) 05/15/2019  . Cocaine use   . Sickle cell pain crisis ([place]) 11/14/2017  . Anemia of chronic disease 11/13/2017  . Sinusitis, chronic 11/13/2017  . Sickle cell anemia with crisis ([place]) 11/02/2017  . 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 EYE SURGERY  03/09/2017   left eye  . EYE SURGERY Right 09/2019   right hemerage      OB History    Gravida  2   Para      Term      Preterm      AB      Living  1     SAB      TAB      Ectopic      Multiple      Live Births              Family History  Problem Relation Age of Onset  . Diabetes Father     Social History   Tobacco Use  . Smoking status: Former Smoker    Types: Cigarettes  . Smokeless tobacco: Never Used  Vaping Use  . Vaping Use: Never used  Substance Use Topics  . Alcohol use: Not Currently  . Drug use: No    Home Medications Prior to Admission medications   Medication Sig Start Date End Date Taking? Authorizing Provider  albuterol (VENTOLIN HFA) 108 (90 Base) MCG/ACT inhaler Inhale 2 puffs into the lungs every 6 (six) hours as needed for wheezing or shortness of breath. 05/02/20  Yes King, Diona Foley, NP  citalopram (CELEXA) 10 MG tablet TAKE 1 TABLET(10 MG) BY MOUTH DAILY FOR 14 DAYS THEN TAKE 2 TABLETS(20 MG) BY MOUTH DAILY Patient taking differently: Take 20 mg by mouth daily.  06/09/20  Yes Vevelyn Francois, NP  ibuprofen (ADVIL) 800 MG tablet Take 1 tablet (800 mg total) by mouth every 8 (eight) hours as needed for moderate pain. Take on full stomach. 05/28/20 08/26/20 Yes King, Diona Foley, NP  levocetirizine (XYZAL) 5 MG tablet Take 5 mg by mouth every evening.   Yes [provider]  lidocaine (LIDODERM) 5 % Place 1 patch onto the skin daily. Remove & Discard patch within 12 hours or as directed by MD Patient taking differently: Place 1 patch onto the skin every 12 (twelve) hours as needed (pain). Remove & Discard patch within 12 hours or as directed by MD 05/16/20  Yes Vevelyn Francois, NP  montelukast (SINGULAIR) 10 MG tablet Take 1 tablet (10 mg total) by mouth at bedtime. 05/02/20  Yes Vevelyn Francois, NP  morphine (MS CONTIN) 30 MG 12 hr tablet Take 1 tablet (30 mg total) by mouth every 12 (twelve) hours. Must last 30  days. 05/22/20 06/21/20 Yes King, Diona Foley, NP  NARCAN 4 MG/0.1ML LIQD nasal spray kit Place 4 mg into the nose once as needed (overdose).  12/22/17  Yes [provider]  ondansetron (ZOFRAN) 4 MG tablet Take 4 mg by mouth every 8 (eight) hours as needed for nausea or vomiting.   Yes [provider]  oxyCODONE (ROXICODONE) 15 MG immediate release tablet Take 1 tablet (15 mg total) by mouth every 4 (four) hours as needed for up to 15 days for pain (moderate to severe pain). 05/31/20 06/15/20 Yes King, Diona Foley, NP  pantoprazole (PROTONIX) 40 MG tablet Take 1 tablet (40 mg total) by mouth daily. 05/10/19  Yes Lanae Boast, FNP  promethazine (PHENERGAN) 12.5 MG tablet Take 1 tablet (12.5 mg total) by mouth every 4 (four) hours as needed for nausea or vomiting. 02/25/20  Yes Vevelyn Francois, NP  tiZANidine (ZANAFLEX) 2 MG tablet Take 1 tablet (2 mg total) by mouth every 8 (eight) hours as needed for muscle spasms. 05/16/20 08/14/20 Yes Vevelyn Francois, NP  ergocalciferol (VITAMIN D2) 1.25 MG (50000 UT) capsule Take 1 capsule (50,000 Units total) by mouth once a week. Patient not taking: Reported on 05/02/2020 05/10/19   Lanae Boast, FNP  sucralfate (CARAFATE) 1 GM/10ML suspension Take 10 mLs (1 g total) by mouth 4 (four) times daily for 21 days. 05/16/20 06/06/20  Tresa Garter, MD    Allergies    Penicillins  Review of Systems   Review of Systems  Constitutional: Negative for fever.  HENT: Negative for congestion.   Respiratory: Negative for cough, shortness of breath and wheezing.   Gastrointestinal: Positive for nausea. Negative for vomiting.  All other systems reviewed and are negative.   Physical Exam Updated Vital Signs BP (!) 136/104 (BP Location: Left Arm)   Pulse 92   Temp 98.3 F (36.8 C) (Oral)   Resp 18   SpO2 100%   Physical Exam Vitals and nursing note reviewed.  Constitutional:      General: She is in acute distress.     Appearance: Normal appearance.  She is well-developed and normal weight.  HENT:     Head: Normocephalic and atraumatic.  Eyes:     Pupils: Pupils are equal, round, and reactive to light.  Cardiovascular:     Rate and Rhythm: Normal rate and regular rhythm.     Pulses: Normal pulses.     Heart sounds: Normal heart sounds. No murmur heard.  No friction rub.  Pulmonary:  Effort: Pulmonary effort is normal.     Breath sounds: Normal breath sounds. No wheezing or rales.  Abdominal:     General: Bowel sounds are normal. There is no distension.     Palpations: Abdomen is soft.     Tenderness: There is abdominal tenderness. There is no guarding or rebound.     Comments: Diffuse tenderness without rebound or guarding  Musculoskeletal:        General: Tenderness present. Normal range of motion.     Right lower leg: No edema.     Left lower leg: No edema.     Comments: Diffuse tenderness over her arms and legs with palpation.  Minimal swelling noted in the PIP and DIP joints of the bilateral hands.  Diffuse tenderness with palpation of the back  Skin:    General: Skin is warm and dry.     Findings: No rash.  Neurological:     General: No focal deficit present.     Mental Status: She is alert and oriented to person, place, and time. Mental status is at baseline.     Cranial Nerves: No cranial nerve deficit.  Psychiatric:        Behavior: Behavior normal.     ED Results / Procedures / Treatments   Labs (all labs ordered are listed, but only abnormal results are displayed) Labs Reviewed  CBC WITH DIFFERENTIAL/PLATELET - Abnormal; Notable for the following components:      Result Value   Hemoglobin 11.0 (*)    HCT 30.9 (*)    MCV 74.6 (*)    All other components within normal limits  RETICULOCYTES - Abnormal; Notable for the following components:   Retic Ct Pct 5.1 (*)    Retic Count, Absolute 206.4 (*)    Immature Retic Fract 29.7 (*)    All other components within normal limits  COMPREHENSIVE METABOLIC PANEL   I-STAT BETA HCG BLOOD, ED ([facility], [facility], [facility] ONLY)    EKG None  Radiology No results found.  Procedures Procedures (including critical care time)  Medications Ordered in ED Medications  sodium chloride flush (NS) 0.9 % injection 3 mL (has no administration in time range)  HYDROmorphone (DILAUDID) injection 2 mg (has no administration in time range)  diphenhydrAMINE (BENADRYL) injection 25 mg (has no administration in time range)  ondansetron (ZOFRAN) injection 4 mg (has no administration in time range)  ketorolac (TORADOL) 15 MG/ML injection 15 mg (has no administration in time range)  dextrose 5 %-0.45 % sodium chloride infusion (has no administration in time range)    ED Course  I have reviewed the triage vital signs and the nursing notes.  Pertinent labs & imaging results that were available during my care of the patient were reviewed by me and considered in my medical decision making (see chart for details).    MDM Rules/Calculators/A&P                          Patient is a 34 year old patient with history of sickle cell disease presenting today with sickle cell crisis.  Is having diffuse body pain that is not improving with her home medication.  She denies any fever, vomiting, cough or shortness of breath.  She states she is having abdominal cramping but has not had any vomiting.  LMP was approximately 3 and half weeks ago.  Patient has diffuse tenderness but clear breath sounds and no tachycardia.  Will start on IV therapy.  Labs  are pending.  Labs are at baseline.  Pt still having pain after 2nd round of meds and was given 3rd dose and will need re-evaluation.   Final Clinical Impression(s) / ED Diagnoses Final diagnoses:  None    Rx / DC Orders ED Discharge Orders    None       Blanchie Dessert, MD 06/11/20 1646

## 2020-06-11 NOTE — ED Triage Notes (Signed)
Patient here from home reporting sickle cell pain crisis. Home meds with no relief, reports that she is now off.

## 2020-06-11 NOTE — Telephone Encounter (Signed)
Patient called requesting to come to the [facility] for treatment.. The center does not have the capacity to see the patient today. The center is full. Patient told to continue to take pain medication as prescribed. If the pain is unbearable, patient can go to the ER. Patient verbalized understanding.

## 2020-06-11 NOTE — ED Notes (Signed)
Verbal order from Dr.Pratt obtained to change pt to Med-surg floor instead of telemetry.

## 2020-06-11 NOTE — H&P (Signed)
History and Physical    Hannah Hawkins OAC:[id number] DOB: [birth year] DOA: 06/11/2020  PCP: Vevelyn Francois, NP  Patient coming from: home  I have personally briefly reviewed patient's old medical records in [place]  Chief Complaint: Pain crisis  HPI: Hannah Hawkins is a 34 y.o. female with medical history significant of Hgb [place] disease. Stopped her Hydroxyurea, in hopes of becoming pregnant in 2/21. Multiple recent admissions. Ran out of her usual home meds, due to increased need since 6/16.  She reports this pain is typical to her normal crises with an additional hand and finger joint pain noted.  She denies chest pain shortness of breath or fever.  She denies nausea or vomiting or diarrhea.  She reports ongoing joint swelling for the past 3 months.  She reports a.m. stiffness, difficulty bending her fingers, with negative work-up thus far which includes a negative sed rate in early June.  ED Course: Seen in ED, given IV fluids, Dilaudid 3, without significant improvement in her pain.  Her labs were checked and were unchanged.  Specifically her hemoglobin is 11.0.  Her reticulocyte count percentage is 5.1.  We are asked to see the patient for admission for pain control and management of sickle cell crisis.  Review of Systems: As per HPI otherwise 10 point review of systems negative.   Past Medical History:  Diagnosis Date  . Sickle cell anemia (HCC)     Past Surgical History:  Procedure Laterality Date  . EYE SURGERY  03/09/2017   left eye  . EYE SURGERY Right 09/2019   right hemerage      reports that she has quit smoking. Her smoking use included cigarettes. She has never used smokeless tobacco. She reports previous alcohol use. She reports that she does not use drugs.  Allergies  Allergen Reactions  . Penicillins Swelling and Rash    Has patient had a PCN reaction causing immediate rash, facial/tongue/throat swelling, SOB or lightheadedness with hypotension:  Yes Has patient had a PCN reaction causing severe rash involving mucus membranes or skin necrosis: Yes Has patient had a PCN reaction that required hospitalization No Has patient had a PCN reaction occurring within the last 10 years: no If all of the above answers are "NO", then may proceed with Cephalosporin use.     Family History  Problem Relation Age of Onset  . Diabetes Father      Prior to Admission medications   Medication Sig Start Date End Date Taking? Authorizing Provider  albuterol (VENTOLIN HFA) 108 (90 Base) MCG/ACT inhaler Inhale 2 puffs into the lungs every 6 (six) hours as needed for wheezing or shortness of breath. 05/02/20  Yes King, Diona Foley, NP  citalopram (CELEXA) 10 MG tablet TAKE 1 TABLET(10 MG) BY MOUTH DAILY FOR 14 DAYS THEN TAKE 2 TABLETS(20 MG) BY MOUTH DAILY Patient taking differently: Take 20 mg by mouth daily.  06/09/20  Yes Vevelyn Francois, NP  ibuprofen (ADVIL) 800 MG tablet Take 1 tablet (800 mg total) by mouth every 8 (eight) hours as needed for moderate pain. Take on full stomach. 05/28/20 08/26/20 Yes King, Diona Foley, NP  levocetirizine (XYZAL) 5 MG tablet Take 5 mg by mouth every evening.   Yes [provider]  lidocaine (LIDODERM) 5 % Place 1 patch onto the skin daily. Remove & Discard patch within 12 hours or as directed by MD Patient taking differently: Place 1 patch onto the skin every 12 (twelve) hours as needed (pain). Remove &  Discard patch within 12 hours or as directed by MD 05/16/20  Yes Vevelyn Francois, NP  montelukast (SINGULAIR) 10 MG tablet Take 1 tablet (10 mg total) by mouth at bedtime. 05/02/20  Yes Vevelyn Francois, NP  morphine (MS CONTIN) 30 MG 12 hr tablet Take 1 tablet (30 mg total) by mouth every 12 (twelve) hours. Must last 30 days. 05/22/20 06/21/20 Yes King, Diona Foley, NP  NARCAN 4 MG/0.1ML LIQD nasal spray kit Place 4 mg into the nose once as needed (overdose).  12/22/17  Yes [provider]  ondansetron (ZOFRAN) 4 MG  tablet Take 4 mg by mouth every 8 (eight) hours as needed for nausea or vomiting.   Yes [provider]  oxyCODONE (ROXICODONE) 15 MG immediate release tablet Take 1 tablet (15 mg total) by mouth every 4 (four) hours as needed for up to 15 days for pain (moderate to severe pain). 05/31/20 06/15/20 Yes King, Diona Foley, NP  pantoprazole (PROTONIX) 40 MG tablet Take 1 tablet (40 mg total) by mouth daily. 05/10/19  Yes Lanae Boast, FNP  promethazine (PHENERGAN) 12.5 MG tablet Take 1 tablet (12.5 mg total) by mouth every 4 (four) hours as needed for nausea or vomiting. 02/25/20  Yes Vevelyn Francois, NP  tiZANidine (ZANAFLEX) 2 MG tablet Take 1 tablet (2 mg total) by mouth every 8 (eight) hours as needed for muscle spasms. 05/16/20 08/14/20 Yes Vevelyn Francois, NP  ergocalciferol (VITAMIN D2) 1.25 MG (50000 UT) capsule Take 1 capsule (50,000 Units total) by mouth once a week. Patient not taking: Reported on 05/02/2020 05/10/19   Lanae Boast, FNP  sucralfate (CARAFATE) 1 GM/10ML suspension Take 10 mLs (1 g total) by mouth 4 (four) times daily for 21 days. 05/16/20 06/06/20  Tresa Garter, MD    Physical Exam: Vitals:   06/11/20 1505 06/11/20 1607 06/11/20 1755 06/11/20 1915  BP: (!) 138/103 111/72 120/70 106/69  Pulse: 82 71 71 76  Resp: _0 Temp:      TempSrc:      SpO2: 100% 100% 100% 98%    Constitutional: NAD, calm, comfortable Eyes: PERRL, lids and conjunctivae normal ENMT: Mucous membranes are moist. Posterior pharynx clear of any exudate or lesions.Normal dentition.  Neck: normal, supple, no masses, no thyromegaly Respiratory: clear to auscultation bilaterally, no wheezing, no crackles. Normal respiratory effort. No accessory muscle use.  Cardiovascular: Regular rate and rhythm, no murmurs / rubs / gallops. No extremity edema. 2+ pedal pulses. No carotid bruits.  Abdomen: no tenderness, no masses palpated. No hepatosplenomegaly. Bowel sounds positive.  Skin: no rashes,  lesions, ulcers. No induration Neurologic: CN 2-12 grossly intact. Sensation intact, DTR normal. Strength 5/5 in all 4.  Psychiatric: Normal judgment and insight. Alert and oriented x 3. Normal mood.  Extremities: Diffusely tender in her legs.  She does have swelling and some deformity at the proximal interphalangeal joints on multiple digits.  No significant lower extremity edema  Labs on Admission: I have personally reviewed following labs and imaging studies  CBC: Recent Labs  Lab 06/11/20 1349  WBC 10.1  NEUTROABS 6.8  HGB 11.0*  HCT 30.9*  MCV 74.6*  PLT 637   Basic Metabolic Panel: Recent Labs  Lab 06/11/20 1349  NA 139  K 3.7  CL 105  CO2 27  GLUCOSE 94  BUN 9  CREATININE 0.71  CALCIUM 8.9   GFR: CrCl cannot be calculated (Unknown ideal weight.). Liver Function Tests: Recent Labs  Lab  06/11/20 1349  AST 24  ALT 18  ALKPHOS 52  BILITOT 1.0  PROT 7.9  ALBUMIN 4.4   Anemia Panel: Recent Labs    06/11/20 1349  RETICCTPCT 5.1*   Urine analysis:    Component Value Date/Time   COLORURINE YELLOW 02/08/2020 1026   APPEARANCEUR CLEAR 02/08/2020 1026   LABSPEC 1.013 02/08/2020 1026   PHURINE 6.0 02/08/2020 1026   GLUCOSEU NEGATIVE 02/08/2020 1026   HGBUR NEGATIVE 02/08/2020 1026   BILIRUBINUR negative 05/28/2020 1625   KETONESUR NEGATIVE 02/08/2020 1026   PROTEINUR Negative 05/28/2020 1625   PROTEINUR NEGATIVE 02/08/2020 1026   UROBILINOGEN 0.2 05/28/2020 1625   UROBILINOGEN 1.0 09/30/2017 1513   NITRITE negative 05/28/2020 1625   NITRITE NEGATIVE 02/08/2020 1026   LEUKOCYTESUR Negative 05/28/2020 1625   LEUKOCYTESUR NEGATIVE 02/08/2020 1026    Assessment/Plan Active Problems:   Sickle-cell/Hb-C disease (HCC)   Sickle cell pain crisis (HCC)   Chronic pain syndrome   Joint pain in fingers of both hands  Sickle cell pain crisis IV hydration Dilaudid PCA 0.5 mg with a 10-minute lock out and max of 3 mg/hour Toradol 15 mg IV q 6 hours Tylenol  650 mg po q 6 hours  Hgb [place] disease Add folic acid, consider resumption of Hydroxyurea, defer to [facility] medicine team  Joint pain in fingers of both hands - fingers appear to have a chronic problem going on Check RF, ANCA, ANA, CRP F/u as out patient  Chronic sinusitis Continue home meds  Depression Continue Celexa  DVT prophylaxis: Lovenox SQ Code Status: Full code  Family Communication: Patient at bedside  Disposition Plan: home  Consults called: None--for [place] Medicine team in am Admission status: Observation for possible d/c in next 1-2 days if pain is under control   Donnamae Jude MD [facility]  If 7PM-7AM, please contact night-coverage 06/11/2020, 8:12 PM

## 2020-06-12 ENCOUNTER — Telehealth: Payer: Self-pay | Admitting: Nurse Practitioner

## 2020-06-12 DIAGNOSIS — D5700 Hb-SS disease with crisis, unspecified: Secondary | ICD-10-CM | POA: Diagnosis not present

## 2020-06-12 LAB — CBC
HCT: 27 % — ABNORMAL LOW (ref 36.0–46.0)
Hemoglobin: 9.9 g/dL — ABNORMAL LOW (ref 12.0–15.0)
MCH: 27.3 pg (ref 26.0–34.0)
MCHC: 36.7 g/dL — ABNORMAL HIGH (ref 30.0–36.0)
MCV: 74.6 fL — ABNORMAL LOW (ref 80.0–100.0)
Platelets: 356 10*3/uL (ref 150–400)
RBC: 3.62 MIL/uL — ABNORMAL LOW (ref 3.87–5.11)
RDW: 13.9 % (ref 11.5–15.5)
WBC: 11.3 10*3/uL — ABNORMAL HIGH (ref 4.0–10.5)
nRBC: 2.7 % — ABNORMAL HIGH (ref 0.0–0.2)

## 2020-06-12 LAB — RAPID URINE DRUG SCREEN, HOSP PERFORMED
Amphetamines: NOT DETECTED
Barbiturates: NOT DETECTED
Benzodiazepines: NOT DETECTED
Cocaine: POSITIVE — AB
Opiates: POSITIVE — AB
Tetrahydrocannabinol: NOT DETECTED

## 2020-06-12 MED ORDER — OXYCODONE HCL 5 MG PO TABS
15.0000 mg | ORAL_TABLET | ORAL | Status: DC | PRN
  Administered 2020-06-12: 15 mg via ORAL
  Filled 2020-06-12: qty 3

## 2020-06-12 MED ORDER — SALINE SPRAY 0.65 % NA SOLN
1.0000 | NASAL | Status: DC | PRN
  Administered 2020-06-12: 1 via NASAL
  Filled 2020-06-12: qty 44

## 2020-06-12 MED ORDER — BACITRACIN-NEOMYCIN-POLYMYXIN 400-5-5000 EX OINT
TOPICAL_OINTMENT | CUTANEOUS | Status: DC | PRN
  Filled 2020-06-12: qty 1

## 2020-06-12 NOTE — Telephone Encounter (Signed)
Pt called in refill for oxycodone and citalopram

## 2020-06-12 NOTE — Telephone Encounter (Signed)
Pt is in the hospital .  She will have to call back when she is discharged  Thanks

## 2020-06-13 LAB — ENA+DNA/DS+ANTICH+CENTRO+JO...
Anti JO-1: <0.2 AI (ref 0.0–0.9)
Centromere Ab Screen: 0.2 AI (ref 0.0–0.9)
Chromatin Ab SerPl-aCnc: <0.2 AI (ref 0.0–0.9)
ENA SM Ab Ser-aCnc: <0.2 AI (ref 0.0–0.9)
Ribonucleic Protein: <0.2 AI (ref 0.0–0.9)
SSA (Ro) (ENA) Antibody, IgG: >8 AI — ABNORMAL HIGH (ref 0.0–0.9)
SSB (La) (ENA) Antibody, IgG: <0.2 AI (ref 0.0–0.9)
Scleroderma (Scl-70) (ENA) Antibody, IgG: <0.2 AI (ref 0.0–0.9)
ds DNA Ab: <1 IU/mL (ref 0–9)

## 2020-06-13 LAB — MPO/PR-3 (ANCA) ANTIBODIES
ANCA Proteinase 3: <3.5 U/mL (ref 0.0–3.5)
Myeloperoxidase Abs: <9 U/mL (ref 0.0–9.0)

## 2020-06-13 LAB — RHEUMATOID FACTOR: Rheumatoid fact SerPl-aCnc: <10 IU/mL (ref 0.0–13.9)

## 2020-06-13 LAB — ANA W/REFLEX IF POSITIVE: Anti Nuclear Antibody (ANA): POSITIVE — AB

## 2020-06-13 NOTE — Discharge Summary (Signed)
Physician Discharge Summary  Hannah Hawkins YYQ:[id number] DOB: February 25, 1986 DOA: 06/11/2020  PCP: Hannah Francois, NP  Admit date: 06/11/2020  Discharge date: 06/13/2020  Discharge Diagnoses:  Active Problems:   Sickle-cell/Hb-C disease (HCC)   Sickle cell pain crisis (HCC)   Sinusitis, chronic   Chronic pain syndrome   Joint pain in fingers of both hands   Discharge Condition: Stable  Disposition:  Pt is discharged home in good condition and is to follow up with Hannah Francois, NP this week to have labs evaluated. Hannah Hawkins is instructed to increase activity slowly and balance with rest for the next few days, and use prescribed medication to complete treatment of pain  Diet: Regular Wt Readings from Last 3 Encounters:  06/11/20 67.4 kg  05/28/20 64.9 kg  05/15/20 63.2 kg    History of present illness:  Hannah Hawkins is a 34 year old female with a medical history significant for sickle cell disease type [place], chronic pain syndrome, history of polysubstance abuse, opiate dependence and tolerance, and history of anemia of chronic disease presents complaining of generalized pain.  Patient has had multiple readmissions.  She discontinued her hydroxyurea in hopes of becoming pregnant in 2021.  Patient ran out of her usual home medications, due to increased needs in 06/05/2019 .  She reports that pain is typical to her normal crises with an additional hand and finger joint pain noted.  She denies chest pain, shortness of breath, or fever.  She denies nausea, vomiting, or diarrhea.  She reports ongoing joint swelling for the past 3 months.  She also reports a.m. stiffness, difficulty bending her fingers, with negative work-up thus far, which includes a negative sed rate in early June.  ER course: Patient was evaluated in the ER, given fluids, Dilaudid IV x3, without significant improvement in her pain.  Her labs were checked and were unchanged.  Specifically her hemoglobin is 11.0.   Her reticulocyte count percentage is 5.1.  We are asked to see patient for admission for pain control and management of sickle cell pain crisis.  Hospital Course:  Sickle cell disease with pain crisis: Patient was admitted for sickle cell pain crisis and managed appropriately with IVF, IV Dilaudid via PCA and IV Toradol, as well as other adjunct therapies per sickle cell pain management protocols. Patient continued to complain of increased pain and is requesting additional medication.  Patient also says that she is out of home medication and has placed a prescription requests with PCP.  She will be able to pick up her pain medications on Sunday, 06/15/2020.  IV Dilaudid PCA was discontinued due to positive drug screen.  Patient's urine drug screen showed positive cocaine.  Patient offered information on drug rehabilitation, she declined stating that she was around cocaine but did not ingest it.  Patient was transitioned to oral medication.  Pain intensity is 6/10.  Patient advised to follow-up with PCP.  All laboratory values are consistent with her baseline.  She is alert, oriented, and ambulating without assistance.  Patient will discharge home in a hemodynamically stable condition. Patient was discharged home today in a hemodynamically stable condition.   Discharge Exam: Vitals:   06/12/20 1356 06/12/20 1504  BP: 115/79   Pulse: 89   Resp: 15 12  Temp: 99.6 F (37.6 C)   SpO2: 98% 98%   Vitals:   06/12/20 0935 06/12/20 1108 06/12/20 1356 06/12/20 1504  BP: 115/64  115/79   Pulse: 66  89   Resp:  _0 Temp: 97.8 F (36.6 C)  99.6 F (37.6 C)   TempSrc: Oral  Oral   SpO2: 96% 100% 98% 98%  Weight:        General appearance : Awake, alert, not in any distress. Speech Clear. Not toxic looking HEENT: Atraumatic and Normocephalic, pupils equally reactive to light and accomodation Neck: Supple, no JVD. No cervical lymphadenopathy.  Chest: Good air entry bilaterally, no added sounds   CVS: S1 S2 regular, no murmurs.  Abdomen: Bowel sounds present, Non tender and not distended with no gaurding, rigidity or rebound. Extremities: B/L Lower Ext shows no edema, both legs are warm to touch Neurology: Awake alert, and oriented X 3, CN II-XII intact, Non focal Skin: No Rash  Discharge Instructions  Discharge Instructions    Discharge patient   Complete by: As directed    Discharge disposition: 01-Home or Self Care   Discharge patient date: 06/12/2020     Allergies as of 06/12/2020      Reactions   Penicillins Swelling, Rash   Has patient had a PCN reaction causing immediate rash, facial/tongue/throat swelling, SOB or lightheadedness with hypotension: Yes Has patient had a PCN reaction causing severe rash involving mucus membranes or skin necrosis: Yes Has patient had a PCN reaction that required hospitalization No Has patient had a PCN reaction occurring within the last 10 years: no If all of the above answers are "NO", then may proceed with Cephalosporin use.      Medication List    TAKE these medications   albuterol 108 (90 Base) MCG/ACT inhaler Commonly known as: VENTOLIN HFA Inhale 2 puffs into the lungs every 6 (six) hours as needed for wheezing or shortness of breath.   citalopram 10 MG tablet Commonly known as: CELEXA TAKE 1 TABLET(10 MG) BY MOUTH DAILY FOR 14 DAYS THEN TAKE 2 TABLETS(20 MG) BY MOUTH DAILY What changed: See the new instructions.   ergocalciferol 1.25 MG (50000 UT) capsule Commonly known as: VITAMIN D2 Take 1 capsule (50,000 Units total) by mouth once a week.   ibuprofen 800 MG tablet Commonly known as: ADVIL Take 1 tablet (800 mg total) by mouth every 8 (eight) hours as needed for moderate pain. Take on full stomach.   levocetirizine 5 MG tablet Commonly known as: XYZAL Take 5 mg by mouth every evening.   lidocaine 5 % Commonly known as: Lidoderm Place 1 patch onto the skin daily. Remove & Discard patch within 12 hours or as  directed by MD What changed:   when to take this  reasons to take this   montelukast 10 MG tablet Commonly known as: SINGULAIR Take 1 tablet (10 mg total) by mouth at bedtime.   morphine 30 MG 12 hr tablet Commonly known as: MS CONTIN Take 1 tablet (30 mg total) by mouth every 12 (twelve) hours. Must last 30 days.   Narcan 4 MG/0.1ML Liqd nasal spray kit Generic drug: naloxone Place 4 mg into the nose once as needed (overdose).   ondansetron 4 MG tablet Commonly known as: ZOFRAN Take 4 mg by mouth every 8 (eight) hours as needed for nausea or vomiting.   oxyCODONE 15 MG immediate release tablet Commonly known as: ROXICODONE Take 1 tablet (15 mg total) by mouth every 4 (four) hours as needed for up to 15 days for pain (moderate to severe pain).   pantoprazole 40 MG tablet Commonly known as: PROTONIX Take 1 tablet (40 mg total) by mouth daily.   promethazine  12.5 MG tablet Commonly known as: PHENERGAN Take 1 tablet (12.5 mg total) by mouth every 4 (four) hours as needed for nausea or vomiting.   sucralfate 1 GM/10ML suspension Commonly known as: Carafate Take 10 mLs (1 g total) by mouth 4 (four) times daily for 21 days.   tiZANidine 2 MG tablet Commonly known as: ZANAFLEX Take 1 tablet (2 mg total) by mouth every 8 (eight) hours as needed for muscle spasms.       The results of significant diagnostics from this hospitalization (including imaging, microbiology, ancillary and laboratory) are listed below for reference.    Significant Diagnostic Studies: DG Chest 2 View  Result Date: 05/15/2020 CLINICAL DATA:  Sickle cell pain. EXAM: CHEST - 2 VIEW COMPARISON:  January 04, 2020 FINDINGS: The heart size and mediastinal contours are within normal limits. Both lungs are clear. The visualized skeletal structures are unremarkable. IMPRESSION: No active cardiopulmonary disease. Electronically Signed   By: Constance Holster M.D.   On: 05/15/2020 17:04     Microbiology: Recent Results (from the past 240 hour(s))  SARS Coronavirus 2 by RT PCR (hospital order, performed in [facility] hospital lab) Nasopharyngeal Nasopharyngeal Swab     Status: None   Collection Time: 06/11/20  6:53 PM   Specimen: Nasopharyngeal Swab  Result Value Ref Range Status   SARS Coronavirus 2 NEGATIVE NEGATIVE Final    Comment: (NOTE) SARS-CoV-2 target nucleic acids are NOT DETECTED.  The SARS-CoV-2 RNA is generally detectable in upper and lower respiratory specimens during the acute phase of infection. The lowest concentration of SARS-CoV-2 viral copies this assay can detect is 250 copies / mL. A negative result does not preclude SARS-CoV-2 infection and should not be used as the sole basis for treatment or other patient management decisions.  A negative result may occur with improper specimen collection / handling, submission of specimen other than nasopharyngeal swab, presence of viral mutation(s) within the areas targeted by this assay, and inadequate number of viral copies (<250 copies / mL). A negative result must be combined with clinical observations, patient history, and epidemiological information.  Fact Sheet for Patients:   StrictlyIdeas.no  Fact Sheet for Healthcare Providers: BankingDealers.co.za  This test is not yet approved or  cleared by the [place] FDA and has been authorized for detection and/or diagnosis of SARS-CoV-2 by FDA under an Emergency Use Authorization (EUA).  This EUA will remain in effect (meaning this test can be used) for the duration of the COVID-19 declaration under Section 564(b)(1) of the Act, 21 U.S.C. section 360bbb-3(b)(1), unless the authorization is terminated or revoked sooner.  Performed at [facility], [place] [address], [place], [place] [address]      Labs: Basic Metabolic Panel: Recent Labs  Lab 06/11/20 1349 06/11/20 2228  NA  139  --   K 3.7  --   CL 105  --   CO2 27  --   GLUCOSE 94  --   BUN 9  --   CREATININE 0.71 0.70  CALCIUM 8.9  --    Liver Function Tests: Recent Labs  Lab 06/11/20 1349  AST 24  ALT 18  ALKPHOS 52  BILITOT 1.0  PROT 7.9  ALBUMIN 4.4   No results for input(s): LIPASE, AMYLASE in the last 168 hours. No results for input(s): AMMONIA in the last 168 hours. CBC: Recent Labs  Lab 06/11/20 1349 06/12/20 0604  WBC 10.1 11.3*  NEUTROABS 6.8  --   HGB 11.0* 9.9*  HCT 30.9* 27.0*  MCV 74.6* 74.6*  PLT 394 356   Cardiac Enzymes: No results for input(s): CKTOTAL, CKMB, CKMBINDEX, TROPONINI in the last 168 hours. BNP: Invalid input(s): POCBNP CBG: No results for input(s): GLUCAP in the last 168 hours.  Time coordinating discharge: 30 minutes  Signed:  Donia Pounds  APRN, MSN, FNP-C [facility] [address] [place], [place] [address] [phone]  [facility] 06/13/2020, 7:37 PM

## 2020-06-15 ENCOUNTER — Emergency Department (HOSPITAL_COMMUNITY): Payer: Medicaid Other

## 2020-06-15 ENCOUNTER — Other Ambulatory Visit: Payer: Self-pay

## 2020-06-15 ENCOUNTER — Encounter (HOSPITAL_COMMUNITY): Payer: Self-pay | Admitting: Emergency Medicine

## 2020-06-15 ENCOUNTER — Emergency Department (HOSPITAL_COMMUNITY)
Admission: EM | Admit: 2020-06-15 | Discharge: 2020-06-15 | Disposition: A | Discharge status: Home / Self Care | Payer: Medicaid Other | Attending: Emergency Medicine | Admitting: Emergency Medicine

## 2020-06-15 DIAGNOSIS — Z880 Allergy status to penicillin: Secondary | ICD-10-CM | POA: Insufficient documentation

## 2020-06-15 DIAGNOSIS — D571 Sickle-cell disease without crisis: Secondary | ICD-10-CM | POA: Insufficient documentation

## 2020-06-15 DIAGNOSIS — Z87891 Personal history of nicotine dependence: Secondary | ICD-10-CM | POA: Diagnosis not present

## 2020-06-15 DIAGNOSIS — D57219 Sickle-cell/Hb-C disease with crisis, unspecified: Secondary | ICD-10-CM | POA: Diagnosis present

## 2020-06-15 DIAGNOSIS — D5700 Hb-SS disease with crisis, unspecified: Secondary | ICD-10-CM

## 2020-06-15 LAB — CBC WITH DIFFERENTIAL/PLATELET
Abs Immature Granulocytes: 0.02 10*3/uL (ref 0.00–0.07)
Basophils Absolute: 0 10*3/uL (ref 0.0–0.1)
Basophils Relative: 0 %
Eosinophils Absolute: 0.5 10*3/uL (ref 0.0–0.5)
Eosinophils Relative: 5 %
HCT: 29.1 % — ABNORMAL LOW (ref 36.0–46.0)
Hemoglobin: 10.6 g/dL — ABNORMAL LOW (ref 12.0–15.0)
Immature Granulocytes: 0 %
Lymphocytes Relative: 29 %
Lymphs Abs: 2.8 10*3/uL (ref 0.7–4.0)
MCH: 27.1 pg (ref 26.0–34.0)
MCHC: 36.4 g/dL — ABNORMAL HIGH (ref 30.0–36.0)
MCV: 74.4 fL — ABNORMAL LOW (ref 80.0–100.0)
Monocytes Absolute: 0.8 10*3/uL (ref 0.1–1.0)
Monocytes Relative: 8 %
Neutro Abs: 5.5 10*3/uL (ref 1.7–7.7)
Neutrophils Relative %: 58 %
Platelets: 391 10*3/uL (ref 150–400)
RBC: 3.91 MIL/uL (ref 3.87–5.11)
RDW: 13.8 % (ref 11.5–15.5)
WBC: 9.5 10*3/uL (ref 4.0–10.5)
nRBC: 2.5 % — ABNORMAL HIGH (ref 0.0–0.2)

## 2020-06-15 LAB — I-STAT BETA HCG BLOOD, ED (MC, WL, AP ONLY): I-stat hCG, quantitative: <5 m[IU]/mL (ref <5)

## 2020-06-15 LAB — COMPREHENSIVE METABOLIC PANEL
ALT: 19 U/L (ref 0–44)
AST: 26 U/L (ref 15–41)
Albumin: 4.2 g/dL (ref 3.5–5.0)
Alkaline Phosphatase: 47 U/L (ref 38–126)
Anion gap: 11 (ref 5–15)
BUN: 7 mg/dL (ref 6–20)
CO2: 25 mmol/L (ref 22–32)
Calcium: 9.3 mg/dL (ref 8.9–10.3)
Chloride: 103 mmol/L (ref 98–111)
Creatinine, Ser: 0.7 mg/dL (ref 0.44–1.00)
GFR calc Af Amer: >60 mL/min (ref >60)
GFR calc non Af Amer: >60 mL/min (ref >60)
Glucose, Bld: 83 mg/dL (ref 70–99)
Potassium: 3.7 mmol/L (ref 3.5–5.1)
Sodium: 139 mmol/L (ref 135–145)
Total Bilirubin: 0.8 mg/dL (ref 0.3–1.2)
Total Protein: 7.5 g/dL (ref 6.5–8.1)

## 2020-06-15 LAB — RETICULOCYTES
Immature Retic Fract: 33.2 % — ABNORMAL HIGH (ref 2.3–15.9)
RBC.: 3.94 MIL/uL (ref 3.87–5.11)
Retic Count, Absolute: 230.9 10*3/uL — ABNORMAL HIGH (ref 19.0–186.0)
Retic Ct Pct: 5.9 % — ABNORMAL HIGH (ref 0.4–3.1)

## 2020-06-15 MED ORDER — ONDANSETRON HCL 4 MG/2ML IJ SOLN
4.0000 mg | Freq: Once | INTRAMUSCULAR | Status: AC
  Administered 2020-06-15: 4 mg via INTRAVENOUS
  Filled 2020-06-15: qty 2

## 2020-06-15 MED ORDER — MORPHINE SULFATE ER 30 MG PO TBCR
30.0000 mg | EXTENDED_RELEASE_TABLET | Freq: Two times a day (BID) | ORAL | Status: DC
  Administered 2020-06-15: 30 mg via ORAL
  Filled 2020-06-15: qty 2

## 2020-06-15 MED ORDER — SODIUM CHLORIDE 0.9 % IV SOLN: Freq: Once | INTRAVENOUS | Status: AC

## 2020-06-15 MED ORDER — DIPHENHYDRAMINE HCL 25 MG PO CAPS
50.0000 mg | ORAL_CAPSULE | Freq: Once | ORAL | Status: AC
  Administered 2020-06-15: 50 mg via ORAL
  Filled 2020-06-15: qty 2

## 2020-06-15 MED ORDER — KETOROLAC TROMETHAMINE 30 MG/ML IJ SOLN
30.0000 mg | Freq: Once | INTRAMUSCULAR | Status: AC
  Administered 2020-06-15: 30 mg via INTRAVENOUS
  Filled 2020-06-15: qty 1

## 2020-06-15 MED ORDER — HYDROMORPHONE HCL 2 MG/ML IJ SOLN
2.0000 mg | INTRAMUSCULAR | Status: AC | PRN
  Administered 2020-06-15 (×3): 2 mg via INTRAVENOUS
  Filled 2020-06-15 (×3): qty 1

## 2020-06-15 NOTE — ED Triage Notes (Signed)
Per pt, states sickle cell pain that started yesterday-complaining of leg pain

## 2020-06-15 NOTE — Discharge Instructions (Signed)
Please follow-up with your doctors regarding maintenance medications.  You have been given a 12-hour morphine medication in addition to your treatment here in the ED, this should help continue to sustain your pain relief.    Return to the ER or seek any medical attention should experience any new or worsening symptoms.

## 2020-06-15 NOTE — ED Provider Notes (Addendum)
[facility] DEPT Provider Note   CSN: [id number] Arrival date & time: 06/15/20  1812     History Chief Complaint  Patient presents with  . Sickle Cell Pain Crisis    Hannah Hawkins is a 34 y.o. female with past medical history notable for sickle cell disease type [place], chronic pain syndrome, polysubstance use, and chronic anemia who presents to the ED with a 1 day history of leg pain that she reports to be consistent with her history of sickle cell crisis.  Patient was just recently admitted on 06/11/2020 and discharged 06/13/2020 for similar complaints.  She had recently discontinued her hydroxyurea in hopes of becoming pregnant.  According to discharge note, she was out of her maintenance medications at home and would be able to pick them up on 06/15/2020.  On my examination, patient states that she is not only having leg discomfort, however generalized pain symptoms.  She states that it feels consistent with her history of sickle cell crisis.  She is also endorsing chest discomfort, but denies any difficulty breathing or cough.  She denies any fevers at home.  She states that she was still in pain at time of discharge.  She states that she called the pharmacy and her maintenance medications had not yet been called in.  She has been taking muscle relaxant for symptoms of pain, but without any significant relief.  She is rocking back and forth on my examination and in severe pain.  She denies any abdominal pain, nausea or vomiting, urinary symptoms, or change in bowel habits.  HPI     Past Medical History:  Diagnosis Date  . Sickle cell anemia [facility])     Patient Active Problem List   Diagnosis Date Noted  . Joint pain in fingers of both hands 06/11/2020  . GERD (gastroesophageal reflux disease) 05/29/2019  . History of cocaine use 05/18/2019  . Chronic pain syndrome   . Cocaine use   . Sickle cell pain crisis ([place]) 11/14/2017  . Anemia of chronic disease  11/13/2017  . Sinusitis, chronic 11/13/2017  . 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 EYE SURGERY  03/09/2017   left eye  . EYE SURGERY Right 09/2019   right hemerage      OB History    Gravida  2   Para      Term      Preterm      AB      Living  1     SAB      TAB      Ectopic      Multiple      Live Births              Family History  Problem Relation Age of Onset  . Diabetes Father     Social History   Tobacco Use  . Smoking status: Former Smoker    Types: Cigarettes  . Smokeless tobacco: Never Used  Vaping Use  . Vaping Use: Never used  Substance Use Topics  . Alcohol use: Not Currently  . Drug use: No    Home Medications Prior to Admission medications   Medication Sig Start Date End Date Taking? Authorizing Provider  albuterol (VENTOLIN HFA) 108 (90 Base) MCG/ACT inhaler Inhale 2 puffs into the lungs every 6 (six) hours as needed for wheezing or shortness of breath. 05/02/20  Yes Vevelyn Francois, NP  citalopram (CELEXA) 10 MG  tablet TAKE 1 TABLET(10 MG) BY MOUTH DAILY FOR 14 DAYS THEN TAKE 2 TABLETS(20 MG) BY MOUTH DAILY Patient taking differently: Take 20 mg by mouth daily.  06/09/20  Yes Vevelyn Francois, NP  ibuprofen (ADVIL) 800 MG tablet Take 1 tablet (800 mg total) by mouth every 8 (eight) hours as needed for moderate pain. Take on full stomach. 05/28/20 08/26/20 Yes King, Diona Foley, NP  levocetirizine (XYZAL) 5 MG tablet Take 5 mg by mouth every evening.   Yes [provider]  lidocaine (LIDODERM) 5 % Place 1 patch onto the skin daily. Remove & Discard patch within 12 hours or as directed by MD Patient taking differently: Place 1 patch onto the skin every 12 (twelve) hours as needed (pain). Remove & Discard patch within 12 hours or as directed by MD 05/16/20  Yes Vevelyn Francois, NP  montelukast (SINGULAIR) 10 MG tablet Take 1 tablet (10 mg total) by mouth at bedtime. 05/02/20   Yes Vevelyn Francois, NP  morphine (MS CONTIN) 30 MG 12 hr tablet Take 1 tablet (30 mg total) by mouth every 12 (twelve) hours. Must last 30 days. 05/22/20 06/21/20 Yes King, Diona Foley, NP  NARCAN 4 MG/0.1ML LIQD nasal spray kit Place 4 mg into the nose once as needed (overdose).  12/22/17  Yes [provider]  ondansetron (ZOFRAN) 4 MG tablet Take 4 mg by mouth every 8 (eight) hours as needed for nausea or vomiting.   Yes [provider]  oxyCODONE (ROXICODONE) 15 MG immediate release tablet Take 1 tablet (15 mg total) by mouth every 4 (four) hours as needed for up to 15 days for pain (moderate to severe pain). 05/31/20 06/15/20 Yes King, Diona Foley, NP  pantoprazole (PROTONIX) 40 MG tablet Take 1 tablet (40 mg total) by mouth daily. 05/10/19  Yes Lanae Boast, FNP  promethazine (PHENERGAN) 12.5 MG tablet Take 1 tablet (12.5 mg total) by mouth every 4 (four) hours as needed for nausea or vomiting. 02/25/20  Yes Vevelyn Francois, NP  tiZANidine (ZANAFLEX) 2 MG tablet Take 1 tablet (2 mg total) by mouth every 8 (eight) hours as needed for muscle spasms. 05/16/20 08/14/20 Yes Vevelyn Francois, NP  ergocalciferol (VITAMIN D2) 1.25 MG (50000 UT) capsule Take 1 capsule (50,000 Units total) by mouth once a week. Patient not taking: Reported on 05/02/2020 05/10/19   Lanae Boast, FNP  sucralfate (CARAFATE) 1 GM/10ML suspension Take 10 mLs (1 g total) by mouth 4 (four) times daily for 21 days. 05/16/20 06/06/20  Tresa Garter, MD    Allergies    Penicillins  Review of Systems   Review of Systems  All other systems reviewed and are negative.   Physical Exam Updated Vital Signs BP (!) 109/58   Pulse 70   Temp 98.4 F (36.9 C) (Oral)   Resp 18   Ht '5\' 3"'  (1.6 m)   Wt 63.5 kg   SpO2 99%   BMI 24.80 kg/m   Physical Exam  ED Results / Procedures / Treatments   Labs (all labs ordered are listed, but only abnormal results are displayed) Labs Reviewed  CBC WITH DIFFERENTIAL/PLATELET -  Abnormal; Notable for the following components:      Result Value   Hemoglobin 10.6 (*)    HCT 29.1 (*)    MCV 74.4 (*)    MCHC 36.4 (*)    nRBC 2.5 (*)    All other components within normal limits  RETICULOCYTES - Abnormal; Notable for  the following components:   Retic Ct Pct 5.9 (*)    Retic Count, Absolute 230.9 (*)    Immature Retic Fract 33.2 (*)    All other components within normal limits  COMPREHENSIVE METABOLIC PANEL  I-STAT BETA HCG BLOOD, ED ([facility], [facility], [facility] ONLY)    EKG None  Radiology DG Chest 2 View  Result Date: 06/15/2020 CLINICAL DATA:  Sickle cell pain EXAM: CHEST - 2 VIEW COMPARISON:  May 15, 2020 FINDINGS: The heart size and mediastinal contours are within normal limits. Both lungs are clear. The visualized skeletal structures are unremarkable. IMPRESSION: No active cardiopulmonary disease. Electronically Signed   By: Constance Holster M.D.   On: 06/15/2020 21:51    Procedures Procedures (including critical care time)  Medications Ordered in ED Medications  morphine (MS CONTIN) 12 hr tablet 30 mg (has no administration in time range)  0.9 %  sodium chloride infusion ( Intravenous Stopped 06/15/20 2255)  ketorolac (TORADOL) 30 MG/ML injection 30 mg (30 mg Intravenous Given 06/15/20 2127)  diphenhydrAMINE (BENADRYL) capsule 50 mg (50 mg Oral Given 06/15/20 2124)  HYDROmorphone (DILAUDID) injection 2 mg (2 mg Intravenous Given 06/15/20 2255)  ondansetron (ZOFRAN) injection 4 mg (4 mg Intravenous Given 06/15/20 2127)    ED Course  I have reviewed the triage vital signs and the nursing notes.  Pertinent labs & imaging results that were available during my care of the patient were reviewed by me and considered in my medical decision making (see chart for details).    MDM Rules/Calculators/A&P                          We will obtain laboratory work-up including reticulocyte count for her suspected sickle cell crisis and will treat empirically.  We will treat her  with 2 mg IV Dilaudid every 30 minutes x30.  We will also provide IV Toradol, oral Benadryl, and IV Zofran.  We will continue to reassess and will cease as needed Dilaudid medication should her symptoms improve.  Will obtain DG chest to assess for acute chest syndrome.  Labs: CBC with differential: Anemia to 10.6 hemoglobin, consistent with patient's baseline. Beta-hCG: Negative. CMP: WNL. Reticulocyte count: Elevated retake count to 230.9.   Imaging: DG chest 2 view was personally reviewed and demonstrates no acute cardiopulmonary findings.  No consolidation concerning for acute chest syndrome.  On subsequent evaluation, patient is feeling entirely improved.  She is resting comfortably in no acute distress.  She has had dramatic improvement from when she first came in.  She is still concerned because she does not have her maintenance medications at home.  However, she does plan to follow-up with the sickle cell clinic tomorrow morning should her symptoms return.  She will also call her provider about calling intermittent medications.  Patient then tells me at time of discharge that she is unsure as to whether or not she will be prescribed her typical maintenance medications given her recent cocaine use.  She states that she might require 3 negative urine drug screenings prior to resumption of her pain meds.  During first exam she simply told me that she "didn't know" why they hadn't been called in and was asking to be discharged with pain medications despite her preexisting contract.  Strict ED return precautions discussed.  All of the evaluation and work-up results were discussed with the patient and any family at bedside. They were provided opportunity to ask any additional questions and have  none at this time. They have expressed understanding of verbal discharge instructions as well as return precautions and are agreeable to the plan.    Final Clinical Impression(s) / ED Diagnoses Final  diagnoses:  Sickle cell pain crisis [facility])    Rx / DC Orders ED Discharge Orders    None       Corena Herter, PA-C 06/15/20 2330    Corena Herter, PA-C 06/16/20 1159    Virgel Manifold, MD 06/16/20 1505

## 2020-06-17 ENCOUNTER — Telehealth: Payer: Self-pay | Admitting: Nurse Practitioner

## 2020-06-17 ENCOUNTER — Other Ambulatory Visit: Payer: Self-pay | Admitting: Family Medicine

## 2020-06-17 DIAGNOSIS — G894 Chronic pain syndrome: Secondary | ICD-10-CM

## 2020-06-17 DIAGNOSIS — D5720 Sickle-cell/Hb-C disease without crisis: Secondary | ICD-10-CM

## 2020-06-17 LAB — ANCA TITERS
Atypical P-ANCA titer: 1:320 {titer} — ABNORMAL HIGH
C-ANCA: <1:20 {titer}
P-ANCA: <1:20 {titer}

## 2020-06-17 MED ORDER — MORPHINE SULFATE ER 30 MG PO TBCR: 30.0000 mg | EXTENDED_RELEASE_TABLET | Freq: Two times a day (BID) | ORAL | 0 refills | Status: DC

## 2020-06-17 MED ORDER — OXYCODONE HCL 15 MG PO TABS: 15.0000 mg | ORAL_TABLET | ORAL | 0 refills | Status: DC | PRN

## 2020-06-17 NOTE — Telephone Encounter (Signed)
Pt called in a refill on morphine. Pt is aware it is not due until July 3.

## 2020-06-17 NOTE — Telephone Encounter (Signed)
Pt called in refill on oxycodone and citalopram.

## 2020-06-17 NOTE — Telephone Encounter (Signed)
Pt called wanting a call back from the nurse.

## 2020-06-17 NOTE — Telephone Encounter (Signed)
Called lvm for patient to call.

## 2020-06-24 ENCOUNTER — Telehealth: Payer: Self-pay | Admitting: Nurse Practitioner

## 2020-06-24 NOTE — Telephone Encounter (Signed)
Pt needs you to resend morphine prescription so her insurance can cover it.

## 2020-06-25 ENCOUNTER — Telehealth: Payer: Self-pay | Admitting: Nurse Practitioner

## 2020-06-25 ENCOUNTER — Other Ambulatory Visit: Payer: Self-pay | Admitting: Nurse Practitioner

## 2020-06-25 DIAGNOSIS — G894 Chronic pain syndrome: Secondary | ICD-10-CM

## 2020-06-25 DIAGNOSIS — D5720 Sickle-cell/Hb-C disease without crisis: Secondary | ICD-10-CM

## 2020-06-25 MED ORDER — MORPHINE SULFATE ER 30 MG PO TBCR: 30.0000 mg | EXTENDED_RELEASE_TABLET | Freq: Two times a day (BID) | ORAL | 0 refills | Status: DC

## 2020-06-25 NOTE — Telephone Encounter (Signed)
sent 

## 2020-06-25 NOTE — Telephone Encounter (Signed)
I have sent in her Moprhine.  I sent a message to the nurse also

## 2020-06-26 ENCOUNTER — Other Ambulatory Visit: Payer: Self-pay | Admitting: Nurse Practitioner

## 2020-06-26 DIAGNOSIS — R768 Other specified abnormal immunological findings in serum: Secondary | ICD-10-CM

## 2020-06-26 NOTE — Progress Notes (Signed)
Please make Ms Hollopeter aware that will be referring her to rheumatology for further evaluation. This may help explain her swelling. Thanks

## 2020-06-30 ENCOUNTER — Ambulatory Visit (INDEPENDENT_AMBULATORY_CARE_PROVIDER_SITE_OTHER): Payer: Medicaid Other | Admitting: Nurse Practitioner

## 2020-06-30 ENCOUNTER — Other Ambulatory Visit: Payer: Self-pay

## 2020-06-30 ENCOUNTER — Telehealth: Payer: Self-pay | Admitting: Nurse Practitioner

## 2020-06-30 DIAGNOSIS — F1490 Cocaine use, unspecified, uncomplicated: Secondary | ICD-10-CM | POA: Diagnosis not present

## 2020-06-30 DIAGNOSIS — G894 Chronic pain syndrome: Secondary | ICD-10-CM | POA: Diagnosis not present

## 2020-06-30 NOTE — Telephone Encounter (Signed)
Please place her in as a 1300 televist. I have already spoken with her. Thanks  She will also need a Nurse Visit apt for a Urine Drug Screen @ 1300 on Wednesday  thanks

## 2020-07-01 NOTE — Progress Notes (Signed)
° °  [facility] Patient [facility] [name] [place], [place]  [address] Phone:  [phone]   Fax:  [phone]    Virtual telephone visit      Virtual Visit via Telephone Note   This visit type was conducted due to national recommendations for restrictions regarding the COVID-19 Pandemic (e.g. social distancing) in an effort to limit this patient's exposure and mitigate transmission in our community. Due to her co-morbid illnesses, this patient is at least at moderate risk for complications without adequate follow up. This format is felt to be most appropriate for this patient at this time. The patient did not have access to video technology or had technical difficulties with video requiring transitioning to audio format only (telephone). Physical exam was limited to content and character of the telephone converstion.    Patient location: home Provider location:office    Patient: Hannah Hawkins   DOB: 05-02-1986   34 y.o. Female  MRN: [id number] Visit Date: 06/30/2020  Today's Provider: Barbette Merino, NP  Subjective:   No chief complaint on file.  HPI  Subjective:      Hannah Hawkins is a 34 y.o. female  Called in today for medication refill. She  has a past medical history of Sickle cell anemia (HCC).  During her last hospital visit her rapid urine drug screen was positive for cocaine and opioids.  She has a history of cocaine use.  She is on long-term opioids for sickle cell disease.  She is under a current opioid agreement with the patient care center. She is aware that this is not in compliance with her pain agreement. Review of Systems Pertinent items are noted in HPI.    Objective:    No exam performed today, Televisit.  Lab Review UA has been reviewed. UA results: Positive for cocaine   Assessment:    Cocaine use   Plan:    Repeat urine drug screen on Wednesday no opioids will be prescribed over the next month or until there are 2 urine drug screens  free of any illegal substance    I discussed the assessment and treatment plan with the patient. The patient was provided an opportunity to ask questions and all were answered. The patient agreed with the plan and demonstrated an understanding of the instructions.   The patient was advised to call back or seek an in-person evaluation if the symptoms worsen or if the condition fails to improve as anticipated.  I provided 5 minutes of non-face-to-face time during this encounter.   Barbette Merino, NP  [facility] Patient [facility] [phone] (phone) [phone] (fax)  [facility]

## 2020-07-04 ENCOUNTER — Other Ambulatory Visit: Payer: Medicaid Other

## 2020-07-04 DIAGNOSIS — F1490 Cocaine use, unspecified, uncomplicated: Secondary | ICD-10-CM

## 2020-07-04 DIAGNOSIS — G894 Chronic pain syndrome: Secondary | ICD-10-CM

## 2020-07-04 NOTE — Progress Notes (Signed)
Patient came in to office today to leave urine specimen for Urine Drug Screen.

## 2020-07-09 ENCOUNTER — Other Ambulatory Visit: Payer: Self-pay | Admitting: Nurse Practitioner

## 2020-07-11 LAB — DRUG SCREEN 764883 11+OXYCO+ALC+CRT-BUND
Amphetamines, Urine: NEGATIVE ng/mL
BENZODIAZ UR QL: NEGATIVE ng/mL
Barbiturate: NEGATIVE ng/mL
Cannabinoid Quant, Ur: NEGATIVE ng/mL
Cocaine (Metabolite): NEGATIVE ng/mL
Creatinine: 78.6 mg/dL (ref 20.0–300.0)
Ethanol: NEGATIVE %
Meperidine: NEGATIVE ng/mL
Methadone Screen, Urine: NEGATIVE ng/mL
Oxycodone/Oxymorphone, Urine: NEGATIVE ng/mL
Phencyclidine: NEGATIVE ng/mL
Propoxyphene: NEGATIVE ng/mL
Tramadol: NEGATIVE ng/mL
pH, Urine: 6.4 (ref 4.5–8.9)

## 2020-07-11 LAB — OPIATES CONFIRMATION, URINE
Codeine: NEGATIVE
Hydrocodone: NEGATIVE
Hydromorphone: NEGATIVE
Morphine Confirm: >3000 ng/mL
Morphine: POSITIVE — AB
Opiates: POSITIVE ng/mL — AB

## 2020-07-13 ENCOUNTER — Inpatient Hospital Stay (HOSPITAL_COMMUNITY)
Admission: EM | Admit: 2020-07-13 | Discharge: 2020-07-17 | DRG: 812 | Disposition: A | Discharge status: Home / Self Care | Payer: Medicaid Other | Attending: Internal Medicine | Admitting: Internal Medicine

## 2020-07-13 ENCOUNTER — Encounter (HOSPITAL_COMMUNITY): Payer: Self-pay

## 2020-07-13 ENCOUNTER — Other Ambulatory Visit: Payer: Self-pay

## 2020-07-13 ENCOUNTER — Emergency Department (HOSPITAL_COMMUNITY): Payer: Medicaid Other

## 2020-07-13 DIAGNOSIS — Z833 Family history of diabetes mellitus: Secondary | ICD-10-CM

## 2020-07-13 DIAGNOSIS — D638 Anemia in other chronic diseases classified elsewhere: Secondary | ICD-10-CM | POA: Diagnosis present

## 2020-07-13 DIAGNOSIS — Z87891 Personal history of nicotine dependence: Secondary | ICD-10-CM

## 2020-07-13 DIAGNOSIS — R1110 Vomiting, unspecified: Secondary | ICD-10-CM

## 2020-07-13 DIAGNOSIS — D5720 Sickle-cell/Hb-C disease without crisis: Secondary | ICD-10-CM

## 2020-07-13 DIAGNOSIS — D5700 Hb-SS disease with crisis, unspecified: Principal | ICD-10-CM | POA: Diagnosis present

## 2020-07-13 DIAGNOSIS — Z880 Allergy status to penicillin: Secondary | ICD-10-CM

## 2020-07-13 DIAGNOSIS — Z20822 Contact with and (suspected) exposure to covid-19: Secondary | ICD-10-CM | POA: Diagnosis present

## 2020-07-13 DIAGNOSIS — G894 Chronic pain syndrome: Secondary | ICD-10-CM | POA: Diagnosis present

## 2020-07-13 DIAGNOSIS — K219 Gastro-esophageal reflux disease without esophagitis: Secondary | ICD-10-CM | POA: Diagnosis present

## 2020-07-13 DIAGNOSIS — D735 Infarction of spleen: Secondary | ICD-10-CM | POA: Diagnosis present

## 2020-07-13 DIAGNOSIS — R112 Nausea with vomiting, unspecified: Secondary | ICD-10-CM | POA: Diagnosis present

## 2020-07-13 DIAGNOSIS — F1120 Opioid dependence, uncomplicated: Secondary | ICD-10-CM | POA: Diagnosis present

## 2020-07-13 DIAGNOSIS — E876 Hypokalemia: Secondary | ICD-10-CM | POA: Diagnosis present

## 2020-07-13 DIAGNOSIS — Z8711 Personal history of peptic ulcer disease: Secondary | ICD-10-CM

## 2020-07-13 DIAGNOSIS — D72829 Elevated white blood cell count, unspecified: Secondary | ICD-10-CM | POA: Diagnosis present

## 2020-07-13 LAB — COMPREHENSIVE METABOLIC PANEL
ALT: 22 U/L (ref 0–44)
AST: 29 U/L (ref 15–41)
Albumin: 4.3 g/dL (ref 3.5–5.0)
Alkaline Phosphatase: 58 U/L (ref 38–126)
Anion gap: 14 (ref 5–15)
BUN: 10 mg/dL (ref 6–20)
CO2: 23 mmol/L (ref 22–32)
Calcium: 10 mg/dL (ref 8.9–10.3)
Chloride: 104 mmol/L (ref 98–111)
Creatinine, Ser: 0.78 mg/dL (ref 0.44–1.00)
GFR calc Af Amer: >60 mL/min (ref >60)
GFR calc non Af Amer: >60 mL/min (ref >60)
Glucose, Bld: 83 mg/dL (ref 70–99)
Potassium: 4.4 mmol/L (ref 3.5–5.1)
Sodium: 141 mmol/L (ref 135–145)
Total Bilirubin: 0.7 mg/dL (ref 0.3–1.2)
Total Protein: 8 g/dL (ref 6.5–8.1)

## 2020-07-13 LAB — I-STAT BETA HCG BLOOD, ED (MC, WL, AP ONLY): I-stat hCG, quantitative: <5 m[IU]/mL (ref <5)

## 2020-07-13 LAB — LACTIC ACID, PLASMA: Lactic Acid, Venous: 3.3 mmol/L (ref 0.5–1.9)

## 2020-07-13 LAB — RETICULOCYTES
Immature Retic Fract: 18.8 % — ABNORMAL HIGH (ref 2.3–15.9)
RBC.: 4.06 MIL/uL (ref 3.87–5.11)
Retic Count, Absolute: 127.9 10*3/uL (ref 19.0–186.0)
Retic Ct Pct: 3.2 % — ABNORMAL HIGH (ref 0.4–3.1)

## 2020-07-13 MED ORDER — SODIUM CHLORIDE (PF) 0.9 % IJ SOLN
INTRAMUSCULAR | Status: AC
  Administered 2020-07-14: 10 mL
  Filled 2020-07-13: qty 50

## 2020-07-13 MED ORDER — HYDROMORPHONE HCL 2 MG/ML IJ SOLN
2.0000 mg | INTRAMUSCULAR | Status: AC
  Administered 2020-07-13: 2 mg via INTRAVENOUS
  Filled 2020-07-13: qty 1

## 2020-07-13 MED ORDER — IOHEXOL 300 MG/ML  SOLN
100.0000 mL | Freq: Once | INTRAMUSCULAR | Status: AC | PRN
  Administered 2020-07-13: 100 mL via INTRAVENOUS

## 2020-07-13 MED ORDER — SODIUM CHLORIDE 0.45 % IV SOLN: INTRAVENOUS | Status: DC

## 2020-07-13 MED ORDER — PANTOPRAZOLE SODIUM 40 MG IV SOLR
40.0000 mg | Freq: Once | INTRAVENOUS | Status: AC
  Administered 2020-07-13: 40 mg via INTRAVENOUS
  Filled 2020-07-13: qty 40

## 2020-07-13 MED ORDER — OXYCODONE HCL 5 MG PO TABS
15.0000 mg | ORAL_TABLET | Freq: Once | ORAL | Status: AC
  Administered 2020-07-13: 15 mg via ORAL
  Filled 2020-07-13: qty 3

## 2020-07-13 NOTE — ED Triage Notes (Signed)
Patient brought in by EMS for sickle cell pain, started today, patient did not take any home pain meds prior to arrival. Patient states pain is "all over". Patient is AxOx4, moaning and crying.

## 2020-07-13 NOTE — ED Provider Notes (Signed)
[facility] DEPT Provider Note   CSN: [id number] Arrival date & time: 07/13/20  1942     History Chief Complaint  Patient presents with  . Sickle Cell Pain Crisis    Hannah Hawkins is a 34 y.o. female presenting for evaluation of abdominal pain and vomiting.  Patient states she is having a sickle cell pain crisis. She states her pain is in her abdomen. She reports associated nausea and vomiting. Patient states that normally her sickle cell pain is more generalized, mostly in her right leg. However occasionally she does have it in her abdomen. She has not taken anything for her symptoms. She denies fevers, chills, chest pain, shortness of breath, cough, urinary symptoms, abnormal bowel movements. She denies sick contacts. She does report a history of PUD. She denies tobacco use. Reports intermittent alcohol use, none yesterday or today. States last used cocaine 2 weeks ago, denies other drug use including marijuana. Last period was at the beginning of this month, it was normal for her. She is sexually active with one female partner, they do not use contraception. She denies vaginal discharge.  Additional history obtained per chart review. Patient with a history of sickle cell, cocaine, chronic pain, GERD.   HPI     Past Medical History:  Diagnosis Date  . Sickle cell anemia [facility])     Patient Active Problem List   Diagnosis Date Noted  . Joint pain in fingers of both hands 06/11/2020  . GERD (gastroesophageal reflux disease) 05/29/2019  . History of cocaine use 05/18/2019  . Chronic pain syndrome   . Cocaine use   . Sickle cell pain crisis ([place]) 11/14/2017  . Anemia of chronic disease 11/13/2017  . Sinusitis, chronic 11/13/2017  . 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 EYE SURGERY  03/09/2017   left eye  . EYE SURGERY Right 09/2019   right hemerage      OB History    Gravida  2     Para      Term      Preterm      AB      Living  1     SAB      TAB      Ectopic      Multiple      Live Births              Family History  Problem Relation Age of Onset  . Diabetes Father     Social History   Tobacco Use  . Smoking status: Former Smoker    Types: Cigarettes  . Smokeless tobacco: Never Used  Vaping Use  . Vaping Use: Never used  Substance Use Topics  . Alcohol use: Not Currently  . Drug use: No    Home Medications Prior to Admission medications   Medication Sig Start Date End Date Taking? Authorizing Provider  albuterol (VENTOLIN HFA) 108 (90 Base) MCG/ACT inhaler Inhale 2 puffs into the lungs every 6 (six) hours as needed for wheezing or shortness of breath. 05/02/20  Yes King, Diona Foley, NP  citalopram (CELEXA) 10 MG tablet TAKE 1 TABLET(10 MG) BY MOUTH DAILY FOR 14 DAYS THEN TAKE 2 TABLETS(20 MG) BY MOUTH DAILY Patient taking differently: Take 20 mg by mouth daily.  06/09/20  Yes Vevelyn Francois, NP  ibuprofen (ADVIL) 800 MG tablet Take 1 tablet (800 mg total) by mouth every 8 (eight) hours as needed for  moderate pain. Take on full stomach. 05/28/20 08/26/20 Yes King, Diona Foley, NP  levocetirizine (XYZAL) 5 MG tablet Take 5 mg by mouth every evening.   Yes [provider]  lidocaine (LIDODERM) 5 % Place 1 patch onto the skin daily. Remove & Discard patch within 12 hours or as directed by MD Patient taking differently: Place 1 patch onto the skin every 12 (twelve) hours as needed (pain). Remove & Discard patch within 12 hours or as directed by MD 05/16/20  Yes Vevelyn Francois, NP  montelukast (SINGULAIR) 10 MG tablet Take 1 tablet (10 mg total) by mouth at bedtime. 05/02/20  Yes Vevelyn Francois, NP  morphine (MS CONTIN) 30 MG 12 hr tablet Take 1 tablet (30 mg total) by mouth every 12 (twelve) hours. Must last 30 days. 06/25/20 07/25/20 Yes King, Diona Foley, NP  ondansetron (ZOFRAN) 4 MG tablet Take 4 mg by mouth every 8 (eight) hours as  needed for nausea or vomiting.   Yes [provider]  oxyCODONE (ROXICODONE) 15 MG immediate release tablet Take 1 tablet (15 mg total) by mouth every 4 (four) hours as needed for up to 15 days for pain (moderate to severe pain). 06/17/20 07/13/20 Yes Azzie Glatter, FNP  pantoprazole (PROTONIX) 40 MG tablet Take 1 tablet (40 mg total) by mouth daily. 05/10/19  Yes Lanae Boast, FNP  promethazine (PHENERGAN) 12.5 MG tablet Take 1 tablet (12.5 mg total) by mouth every 4 (four) hours as needed for nausea or vomiting. 02/25/20  Yes Vevelyn Francois, NP  sucralfate (CARAFATE) 1 GM/10ML suspension Take 10 mLs (1 g total) by mouth 4 (four) times daily for 21 days. Patient taking differently: Take 1 g by mouth 4 (four) times daily as needed. Pt takes as needed for stomach pain 05/16/20 07/13/20 Yes Jegede, [name], MD  tiZANidine (ZANAFLEX) 2 MG tablet TAKE 1 TABLET(2 MG) BY MOUTH EVERY 8 HOURS AS NEEDED FOR MUSCLE SPASMS Patient taking differently: Take 2 mg by mouth every 8 (eight) hours as needed for muscle spasms.  07/10/20  Yes Vevelyn Francois, NP  ergocalciferol (VITAMIN D2) 1.25 MG (50000 UT) capsule Take 1 capsule (50,000 Units total) by mouth once a week. Patient not taking: Reported on 05/02/2020 05/10/19   Lanae Boast, FNP  NARCAN 4 MG/0.1ML LIQD nasal spray kit Place 4 mg into the nose once as needed (overdose).  12/22/17   [provider]    Allergies    Penicillins  Review of Systems   Review of Systems  Gastrointestinal: Positive for abdominal pain, nausea and vomiting.  All other systems reviewed and are negative.   Physical Exam Updated Vital Signs BP 116/69   Pulse 59   Temp (!) 97.1 F (36.2 C) (Oral)   Resp 13   Ht '5\' 3"'  (1.6 m)   Wt 63.5 kg   SpO2 98%   BMI 24.80 kg/m   Physical Exam Vitals and nursing note reviewed.  Constitutional:      General: She is not in acute distress.    Appearance: She is well-developed.     Comments: Appears  uncomfortable due to pain, otherwise nontoxic  HENT:     Head: Normocephalic and atraumatic.  Eyes:     Extraocular Movements: Extraocular movements intact.     Conjunctiva/sclera: Conjunctivae normal.     Pupils: Pupils are equal, round, and reactive to light.  Cardiovascular:     Rate and Rhythm: Normal rate and regular rhythm.  Pulses: Normal pulses.  Pulmonary:     Effort: Pulmonary effort is normal. No respiratory distress.     Breath sounds: Normal breath sounds. No wheezing.  Abdominal:     General: There is no distension.     Palpations: Abdomen is soft. There is no mass.     Tenderness: There is abdominal tenderness. There is no guarding or rebound.     Comments: Diffuse tenderness palpation the abdomen. No rigidity, guarding, distention. Negative rebound. No peritonitis.  Musculoskeletal:        General: Normal range of motion.     Cervical back: Normal range of motion and neck supple.  Skin:    General: Skin is warm and dry.     Capillary Refill: Capillary refill takes less than 2 seconds.  Neurological:     Mental Status: She is alert and oriented to person, place, and time.     ED Results / Procedures / Treatments   Labs (all labs ordered are listed, but only abnormal results are displayed) Labs Reviewed  LACTIC ACID, PLASMA - Abnormal; Notable for the following components:      Result Value   Lactic Acid, Venous 3.3 (*)    All other components within normal limits  CBC WITH DIFFERENTIAL/PLATELET - Abnormal; Notable for the following components:   Hemoglobin 10.8 (*)    HCT 30.3 (*)    MCV 74.6 (*)    All other components within normal limits  RETICULOCYTES - Abnormal; Notable for the following components:   Retic Ct Pct 3.2 (*)    Immature Retic Fract 18.8 (*)    All other components within normal limits  SARS CORONAVIRUS 2 BY RT PCR (HOSPITAL ORDER, [place] [facility])  COMPREHENSIVE METABOLIC PANEL  LACTIC ACID, PLASMA  CBC WITH  DIFFERENTIAL/PLATELET  I-STAT BETA HCG BLOOD, ED ([facility], [facility], [facility] ONLY)    EKG None  Radiology CT ABDOMEN PELVIS W CONTRAST  Result Date: 07/13/2020 CLINICAL DATA:  Nausea, vomiting, abdominal pain, sickle cell disease EXAM: CT ABDOMEN AND PELVIS WITH CONTRAST TECHNIQUE: Multidetector CT imaging of the abdomen and pelvis was performed using the standard protocol following bolus administration of intravenous contrast. CONTRAST:  148m OMNIPAQUE IOHEXOL 300 MG/ML  SOLN COMPARISON:  None. FINDINGS: Lower chest: The visualized lung bases are clear bilaterally. The visualized heart and pericardium are unremarkable. Hepatobiliary: Liver unremarkable. No intra or extrahepatic biliary ductal dilation. Gallbladder unremarkable. Pancreas: Pancreas unremarkable. 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 No acute infarction identified. Adrenals/Urinary Tract: Adrenal glands are unremarkable. Kidneys are unremarkable. Bladder is unremarkable. Stomach/Bowel: The stomach, small bowel, and large bowel are unremarkable. Appendix normal. No free intraperitoneal gas or fluid. Vascular/Lymphatic: The abdominal vasculature is normal. No pathologic adenopathy within the abdomen and pelvis. Reproductive: Focal hypodensity within the posterior fundal myometrium may represent a small fibroid. The pelvic organs are otherwise unremarkable. Other: Rectum unremarkable. Musculoskeletal: No acute bone abnormality. The osseous structures are unremarkable. IMPRESSION: Changes within the spleen in keeping with progressive are low infarction related to the patient's given history of sickle cell disease. No acute infarct identified. Otherwise normal examination. Electronically Signed   By: AFidela SalisburyMD   On: 07/13/2020 22:08    Procedures Procedures (including critical care time)  Medications Ordered in ED Medications  0.45 % sodium chloride  infusion ( Intravenous New Bag/Given 07/13/20 2047)  sodium chloride (PF) 0.9 % injection (has no  administration in time range)  HYDROmorphone (DILAUDID) injection 2 mg (2 mg Intravenous Given 07/13/20 2048)  HYDROmorphone (DILAUDID) injection 2 mg (2 mg Intravenous Given 07/13/20 2117)  pantoprazole (PROTONIX) injection 40 mg (40 mg Intravenous Given 07/13/20 2048)  iohexol (OMNIPAQUE) 300 MG/ML solution 100 mL (100 mLs Intravenous Contrast Given 07/13/20 2143)  oxyCODONE (Oxy IR/ROXICODONE) immediate release tablet 15 mg (15 mg Oral Given 07/13/20 2337)    ED Course  I have reviewed the triage vital signs and the nursing notes.  Pertinent labs & imaging results that were available during my care of the patient were reviewed by me and considered in my medical decision making (see chart for details).    MDM Rules/Calculators/A&P                          Patient presented for evaluation of abdominal pain, nausea, vomiting. She reports this is consistent with previous sickle cell pain crises. On exam, patient is uncomfortable, but nontoxic. Pain is mostly periumbilical. She does have diffuse tenderness to palpation on exam. Consider appendicitis due to periumbilical pain. Consider PUD/perfect. Consider gastroenteritis. Consider sickle cell pain crisis. Less likely ischemia, although on the differential due to patient's history of sickle cell. Will obtain labs including lactic and CT abdomen pelvis for further evaluation.  CT abdomen pelvis shows stable progression of splenic infarct. No acute findings. On reassessment after pain and nausea control, patient appears more comfortable. However she reports she is now having pain throughout the rest of her body including her right leg, more consistent with her typical sickle cell crises. She does not feel she will be able to go home at her current pain level. Labs show stable hemoglobin. Electrolytes stable. Lactic minimally elevated at 3.3. This may be due to  the splenic infarct versus vomiting pt had PTA. However in the setting of continued pain, pt would likely benefit from admission.   Discussed with Dr. Sidney Ace from [facility], pt to be admitted.   Final Clinical Impression(s) / ED Diagnoses Final diagnoses:  Sickle cell pain crisis ([place])  Non-intractable vomiting with nausea, unspecified vomiting type    Rx / DC Orders ED Discharge Orders    None       Franchot Heidelberg, PA-C 07/13/20 2359    Virgel Manifold, MD 07/14/20 1626

## 2020-07-13 NOTE — ED Notes (Signed)
Date and time results received: 07/13/20 2231 (use smartphrase ".now" to insert current time)  Test: Lactic Acid Critical Value: 3.3  Name of Provider Notified: Raeford Razor, MD  Orders Received? Or Actions Taken?: Waiting on orders

## 2020-07-14 ENCOUNTER — Encounter (HOSPITAL_COMMUNITY): Payer: Self-pay | Admitting: Internal Medicine

## 2020-07-14 DIAGNOSIS — D735 Infarction of spleen: Secondary | ICD-10-CM | POA: Diagnosis present

## 2020-07-14 DIAGNOSIS — K219 Gastro-esophageal reflux disease without esophagitis: Secondary | ICD-10-CM | POA: Diagnosis present

## 2020-07-14 DIAGNOSIS — Z87891 Personal history of nicotine dependence: Secondary | ICD-10-CM | POA: Diagnosis not present

## 2020-07-14 DIAGNOSIS — E876 Hypokalemia: Secondary | ICD-10-CM | POA: Diagnosis not present

## 2020-07-14 DIAGNOSIS — F1120 Opioid dependence, uncomplicated: Secondary | ICD-10-CM | POA: Diagnosis present

## 2020-07-14 DIAGNOSIS — K279 Peptic ulcer, site unspecified, unspecified as acute or chronic, without hemorrhage or perforation: Secondary | ICD-10-CM

## 2020-07-14 DIAGNOSIS — G894 Chronic pain syndrome: Secondary | ICD-10-CM | POA: Diagnosis present

## 2020-07-14 DIAGNOSIS — Z8711 Personal history of peptic ulcer disease: Secondary | ICD-10-CM | POA: Diagnosis not present

## 2020-07-14 DIAGNOSIS — D5720 Sickle-cell/Hb-C disease without crisis: Secondary | ICD-10-CM | POA: Diagnosis not present

## 2020-07-14 DIAGNOSIS — Z833 Family history of diabetes mellitus: Secondary | ICD-10-CM | POA: Diagnosis not present

## 2020-07-14 DIAGNOSIS — D638 Anemia in other chronic diseases classified elsewhere: Secondary | ICD-10-CM | POA: Diagnosis not present

## 2020-07-14 DIAGNOSIS — Z880 Allergy status to penicillin: Secondary | ICD-10-CM | POA: Diagnosis not present

## 2020-07-14 DIAGNOSIS — R112 Nausea with vomiting, unspecified: Secondary | ICD-10-CM | POA: Diagnosis present

## 2020-07-14 DIAGNOSIS — D72829 Elevated white blood cell count, unspecified: Secondary | ICD-10-CM | POA: Diagnosis present

## 2020-07-14 DIAGNOSIS — Z20822 Contact with and (suspected) exposure to covid-19: Secondary | ICD-10-CM | POA: Diagnosis present

## 2020-07-14 DIAGNOSIS — D5700 Hb-SS disease with crisis, unspecified: Secondary | ICD-10-CM | POA: Diagnosis present

## 2020-07-14 LAB — CBC WITH DIFFERENTIAL/PLATELET
Abs Immature Granulocytes: 0.04 10*3/uL (ref 0.00–0.07)
Abs Immature Granulocytes: 0.02 10*3/uL (ref 0.00–0.07)
Basophils Absolute: 0.1 10*3/uL (ref 0.0–0.1)
Basophils Absolute: 0.1 10*3/uL (ref 0.0–0.1)
Basophils Relative: 1 %
Basophils Relative: 1 %
Eosinophils Absolute: 0.1 10*3/uL (ref 0.0–0.5)
Eosinophils Absolute: 0.5 10*3/uL (ref 0.0–0.5)
Eosinophils Relative: 1 %
Eosinophils Relative: 4 %
HCT: 30.3 % — ABNORMAL LOW (ref 36.0–46.0)
HCT: 29.1 % — ABNORMAL LOW (ref 36.0–46.0)
Hemoglobin: 10.8 g/dL — ABNORMAL LOW (ref 12.0–15.0)
Hemoglobin: 10.7 g/dL — ABNORMAL LOW (ref 12.0–15.0)
Immature Granulocytes: 0 %
Immature Granulocytes: 0 %
Lymphocytes Relative: 18 %
Lymphocytes Relative: 34 %
Lymphs Abs: 2.3 10*3/uL (ref 0.7–4.0)
Lymphs Abs: 4.3 10*3/uL — ABNORMAL HIGH (ref 0.7–4.0)
MCH: 26.6 pg (ref 26.0–34.0)
MCH: 27.2 pg (ref 26.0–34.0)
MCHC: 35.6 g/dL (ref 30.0–36.0)
MCHC: 36.8 g/dL — ABNORMAL HIGH (ref 30.0–36.0)
MCV: 74.6 fL — ABNORMAL LOW (ref 80.0–100.0)
MCV: 73.9 fL — ABNORMAL LOW (ref 80.0–100.0)
Monocytes Absolute: 0.8 10*3/uL (ref 0.1–1.0)
Monocytes Absolute: 0.9 10*3/uL (ref 0.1–1.0)
Monocytes Relative: 6 %
Monocytes Relative: 7 %
Neutro Abs: 9.1 10*3/uL — ABNORMAL HIGH (ref 1.7–7.7)
Neutro Abs: 6.8 10*3/uL (ref 1.7–7.7)
Neutrophils Relative %: 74 %
Neutrophils Relative %: 54 %
Platelets: 357 10*3/uL (ref 150–400)
Platelets: 335 10*3/uL (ref 150–400)
RBC: 4.06 MIL/uL (ref 3.87–5.11)
RBC: 3.94 MIL/uL (ref 3.87–5.11)
RDW: 13.2 % (ref 11.5–15.5)
RDW: 13.2 % (ref 11.5–15.5)
WBC: 12.4 10*3/uL — ABNORMAL HIGH (ref 4.0–10.5)
WBC: 12.6 10*3/uL — ABNORMAL HIGH (ref 4.0–10.5)
nRBC: 0.8 % — ABNORMAL HIGH (ref 0.0–0.2)
nRBC: 0.8 % — ABNORMAL HIGH (ref 0.0–0.2)

## 2020-07-14 LAB — BASIC METABOLIC PANEL
Anion gap: 7 (ref 5–15)
BUN: 7 mg/dL (ref 6–20)
CO2: 27 mmol/L (ref 22–32)
Calcium: 8.5 mg/dL — ABNORMAL LOW (ref 8.9–10.3)
Chloride: 102 mmol/L (ref 98–111)
Creatinine, Ser: 0.65 mg/dL (ref 0.44–1.00)
GFR calc Af Amer: >60 mL/min (ref >60)
GFR calc non Af Amer: >60 mL/min (ref >60)
Glucose, Bld: 96 mg/dL (ref 70–99)
Potassium: 3.2 mmol/L — ABNORMAL LOW (ref 3.5–5.1)
Sodium: 136 mmol/L (ref 135–145)

## 2020-07-14 LAB — RETICULOCYTES
Immature Retic Fract: 16.7 % — ABNORMAL HIGH (ref 2.3–15.9)
RBC.: 3.98 MIL/uL (ref 3.87–5.11)
Retic Count, Absolute: 115 10*3/uL (ref 19.0–186.0)
Retic Ct Pct: 2.9 % (ref 0.4–3.1)

## 2020-07-14 LAB — LACTIC ACID, PLASMA
Lactic Acid, Venous: 2.4 mmol/L (ref 0.5–1.9)
Lactic Acid, Venous: 1 mmol/L (ref 0.5–1.9)

## 2020-07-14 LAB — SARS CORONAVIRUS 2 BY RT PCR (HOSPITAL ORDER, PERFORMED IN ~~LOC~~ HOSPITAL LAB): SARS Coronavirus 2: NEGATIVE

## 2020-07-14 MED ORDER — LEVOCETIRIZINE DIHYDROCHLORIDE 5 MG PO TABS: 5.0000 mg | ORAL_TABLET | Freq: Every evening | ORAL | Status: DC

## 2020-07-14 MED ORDER — ALBUTEROL SULFATE HFA 108 (90 BASE) MCG/ACT IN AERS: 2.0000 | INHALATION_SPRAY | Freq: Four times a day (QID) | RESPIRATORY_TRACT | Status: DC | PRN

## 2020-07-14 MED ORDER — LORATADINE 10 MG PO TABS
10.0000 mg | ORAL_TABLET | Freq: Every evening | ORAL | Status: DC
  Administered 2020-07-15 – 2020-07-16 (×2): 10 mg via ORAL
  Filled 2020-07-14 (×4): qty 1

## 2020-07-14 MED ORDER — KETOROLAC TROMETHAMINE 15 MG/ML IJ SOLN: 15.0000 mg | Freq: Four times a day (QID) | INTRAMUSCULAR | Status: DC | PRN

## 2020-07-14 MED ORDER — PROMETHAZINE HCL 25 MG PO TABS: 12.5000 mg | ORAL_TABLET | ORAL | Status: DC | PRN

## 2020-07-14 MED ORDER — LIDOCAINE 5 % EX PTCH
1.0000 | MEDICATED_PATCH | Freq: Two times a day (BID) | CUTANEOUS | Status: DC | PRN
  Administered 2020-07-16: 1 via TRANSDERMAL
  Filled 2020-07-14 (×3): qty 1

## 2020-07-14 MED ORDER — ALUM & MAG HYDROXIDE-SIMETH 200-200-20 MG/5ML PO SUSP: 15.0000 mL | ORAL | Status: DC | PRN

## 2020-07-14 MED ORDER — TIZANIDINE HCL 4 MG PO TABS: 2.0000 mg | ORAL_TABLET | Freq: Three times a day (TID) | ORAL | Status: DC | PRN

## 2020-07-14 MED ORDER — MONTELUKAST SODIUM 10 MG PO TABS
10.0000 mg | ORAL_TABLET | Freq: Every day | ORAL | Status: DC
  Administered 2020-07-14 – 2020-07-16 (×4): 10 mg via ORAL
  Filled 2020-07-14 (×4): qty 1

## 2020-07-14 MED ORDER — ONDANSETRON HCL 4 MG PO TABS
4.0000 mg | ORAL_TABLET | ORAL | Status: DC | PRN
  Administered 2020-07-15: 4 mg via ORAL
  Filled 2020-07-14 (×2): qty 1

## 2020-07-14 MED ORDER — CITALOPRAM HYDROBROMIDE 20 MG PO TABS
20.0000 mg | ORAL_TABLET | Freq: Every day | ORAL | Status: DC
  Administered 2020-07-14 – 2020-07-17 (×4): 20 mg via ORAL
  Filled 2020-07-14 (×2): qty 1
  Filled 2020-07-14: qty 2
  Filled 2020-07-14: qty 1

## 2020-07-14 MED ORDER — SODIUM CHLORIDE 0.9 % IV SOLN: INTRAVENOUS | Status: DC

## 2020-07-14 MED ORDER — PANTOPRAZOLE SODIUM 40 MG PO TBEC
40.0000 mg | DELAYED_RELEASE_TABLET | Freq: Every day | ORAL | Status: DC
  Administered 2020-07-14 – 2020-07-17 (×4): 40 mg via ORAL
  Filled 2020-07-14 (×3): qty 1

## 2020-07-14 MED ORDER — SODIUM CHLORIDE 0.45 % IV SOLN: INTRAVENOUS | Status: DC

## 2020-07-14 MED ORDER — MORPHINE SULFATE ER 30 MG PO TBCR
30.0000 mg | EXTENDED_RELEASE_TABLET | Freq: Two times a day (BID) | ORAL | Status: DC
  Administered 2020-07-14 – 2020-07-17 (×8): 30 mg via ORAL
  Filled 2020-07-14: qty 2
  Filled 2020-07-14 (×5): qty 1
  Filled 2020-07-14: qty 2
  Filled 2020-07-14: qty 1

## 2020-07-14 MED ORDER — ONDANSETRON HCL 4 MG/2ML IJ SOLN
4.0000 mg | INTRAMUSCULAR | Status: DC | PRN
  Administered 2020-07-14 – 2020-07-15 (×2): 4 mg via INTRAVENOUS
  Filled 2020-07-14 (×2): qty 2

## 2020-07-14 MED ORDER — NALOXONE HCL 4 MG/0.1ML NA LIQD: 4.0000 mg | Freq: Once | NASAL | Status: DC | PRN

## 2020-07-14 MED ORDER — KETOROLAC TROMETHAMINE 30 MG/ML IJ SOLN
15.0000 mg | Freq: Four times a day (QID) | INTRAMUSCULAR | Status: DC
  Administered 2020-07-14 – 2020-07-17 (×11): 15 mg via INTRAVENOUS
  Filled 2020-07-14 (×12): qty 1

## 2020-07-14 MED ORDER — HYDROMORPHONE HCL 1 MG/ML IJ SOLN
1.0000 mg | INTRAMUSCULAR | Status: DC | PRN
  Administered 2020-07-14 – 2020-07-15 (×8): 1 mg via INTRAVENOUS
  Filled 2020-07-14 (×10): qty 1

## 2020-07-14 MED ORDER — POLYETHYLENE GLYCOL 3350 17 G PO PACK: 17.0000 g | PACK | Freq: Every day | ORAL | Status: DC | PRN

## 2020-07-14 MED ORDER — SODIUM CHLORIDE 0.9 % IV SOLN: Freq: Once | INTRAVENOUS | Status: DC

## 2020-07-14 MED ORDER — SENNOSIDES-DOCUSATE SODIUM 8.6-50 MG PO TABS
1.0000 | ORAL_TABLET | Freq: Two times a day (BID) | ORAL | Status: DC
  Administered 2020-07-14 – 2020-07-17 (×5): 1 via ORAL
  Filled 2020-07-14 (×6): qty 1

## 2020-07-14 MED ORDER — ONDANSETRON HCL 4 MG PO TABS: 4.0000 mg | ORAL_TABLET | Freq: Three times a day (TID) | ORAL | Status: DC | PRN

## 2020-07-14 MED ORDER — OXYCODONE HCL 5 MG PO TABS
15.0000 mg | ORAL_TABLET | ORAL | Status: DC | PRN
  Administered 2020-07-14 – 2020-07-17 (×14): 15 mg via ORAL
  Filled 2020-07-14 (×14): qty 3

## 2020-07-14 MED ORDER — KETOROLAC TROMETHAMINE 30 MG/ML IJ SOLN
30.0000 mg | Freq: Four times a day (QID) | INTRAMUSCULAR | Status: DC
  Administered 2020-07-14 (×3): 30 mg via INTRAVENOUS
  Filled 2020-07-14 (×3): qty 1

## 2020-07-14 MED ORDER — ALBUTEROL SULFATE (2.5 MG/3ML) 0.083% IN NEBU: 2.5000 mg | INHALATION_SOLUTION | Freq: Four times a day (QID) | RESPIRATORY_TRACT | Status: DC | PRN

## 2020-07-14 MED ORDER — SUCRALFATE 1 GM/10ML PO SUSP: 1.0000 g | Freq: Four times a day (QID) | ORAL | Status: DC | PRN

## 2020-07-14 NOTE — H&P (Signed)
[facility] [place] NAME: Hannah Hawkins    MR#:  [id number]  DATE OF BIRTH:  1986-08-09  DATE OF ADMISSION:  07/13/2020  PRIMARY CARE PHYSICIAN: Vevelyn Francois, NP   REQUESTING/REFERRING PHYSICIAN: Caccavale, Sophia, PA-C  CHIEF COMPLAINT:   Chief Complaint  Patient presents with  . Sickle Cell Pain Crisis    HISTORY OF PRESENT ILLNESS:  Hannah Hawkins  is a 34 y.o. African-American female with a known history of sickle cell disease, GERD with peptic ulcer disease, presented to the emergency room to consider bilateral leg and ankle pain as well as left upper quadrant abdominal pain with associated nausea without vomiting at home however she vomited after Dilaudid here.  She denies any fever or chills.  No chest pain or cough or wheezing or hemoptysis.  She admits to associated bilateral hand pain as well as mild upper back pain.  No dysuria, oliguria or hematuria or flank pain.  No bleeding diathesis.  Upon presentation to the emergency room, temperature was 97.1 with blood pressure 119/86, heart rate of 54 and respirate of 11 with pulse ox 95% on room air.  Labs revealed Unremarkable CMP.  Lactic acid was 3.3 and CBC showed mild leukocytosis of 12.4 with neutrophilia and anemia that is chronic at baseline.  Greatly elucidate count is 3.2.  Pregnancy test was negative.Marland Kitchen  COVID-19 PCR is currently pending.  Abdominal and pelvic CT scan revealed changes within the spleen in keeping with likely old infarct with no new acute infarction with otherwise normal exam.  The patient was given 2 mg of IV Dilaudid twice, 50 mg of p.o. Roxicodone and 40 mg gram of p.o. Protonix bolus started on half-normal saline at 150 mL/h.  She will be admitted to a medical bed for further evaluation and management.  PAST MEDICAL HISTORY:   Past Medical History:  Diagnosis Date  . Sickle cell anemia ([facility])   GERD/peptic ulcer disease.  PAST SURGICAL HISTORY:   Past  Surgical History:  Procedure Laterality Date  . EYE SURGERY  03/09/2017   left eye  . EYE SURGERY Right 09/2019   right hemerage     SOCIAL HISTORY:   Social History   Tobacco Use  . Smoking status: Former Smoker    Types: Cigarettes  . Smokeless tobacco: Never Used  Substance Use Topics  . Alcohol use: Not Currently    FAMILY HISTORY:   Family History  Problem Relation Age of Onset  . Diabetes Father     DRUG ALLERGIES:   Allergies  Allergen Reactions  . Penicillins Swelling and Rash    Has patient had a PCN reaction causing immediate rash, facial/tongue/throat swelling, SOB or lightheadedness with hypotension: Yes Has patient had a PCN reaction causing severe rash involving mucus membranes or skin necrosis: Yes Has patient had a PCN reaction that required hospitalization No Has patient had a PCN reaction occurring within the last 10 years: no If all of the above answers are "NO", then may proceed with Cephalosporin use.     REVIEW OF SYSTEMS:   ROS As per history of present illness. All pertinent systems were reviewed above. Constitutional, HEENT, cardiovascular, respiratory, GI, GU, musculoskeletal, neuro, psychiatric, endocrine, integumentary and hematologic systems were reviewed and are otherwise negative/unremarkable except for positive findings mentioned above in the HPI.   MEDICATIONS AT HOME:   Prior to Admission medications   Medication Sig Start Date End Date Taking? Authorizing Provider  albuterol (VENTOLIN  HFA) 108 (90 Base) MCG/ACT inhaler Inhale 2 puffs into the lungs every 6 (six) hours as needed for wheezing or shortness of breath. 05/02/20  Yes King, Diona Foley, NP  citalopram (CELEXA) 10 MG tablet TAKE 1 TABLET(10 MG) BY MOUTH DAILY FOR 14 DAYS THEN TAKE 2 TABLETS(20 MG) BY MOUTH DAILY Patient taking differently: Take 20 mg by mouth daily.  06/09/20  Yes Vevelyn Francois, NP  ibuprofen (ADVIL) 800 MG tablet Take 1 tablet (800 mg total) by mouth  every 8 (eight) hours as needed for moderate pain. Take on full stomach. 05/28/20 08/26/20 Yes King, Diona Foley, NP  levocetirizine (XYZAL) 5 MG tablet Take 5 mg by mouth every evening.   Yes [provider]  lidocaine (LIDODERM) 5 % Place 1 patch onto the skin daily. Remove & Discard patch within 12 hours or as directed by MD Patient taking differently: Place 1 patch onto the skin every 12 (twelve) hours as needed (pain). Remove & Discard patch within 12 hours or as directed by MD 05/16/20  Yes Vevelyn Francois, NP  montelukast (SINGULAIR) 10 MG tablet Take 1 tablet (10 mg total) by mouth at bedtime. 05/02/20  Yes Vevelyn Francois, NP  morphine (MS CONTIN) 30 MG 12 hr tablet Take 1 tablet (30 mg total) by mouth every 12 (twelve) hours. Must last 30 days. 06/25/20 07/25/20 Yes King, Diona Foley, NP  ondansetron (ZOFRAN) 4 MG tablet Take 4 mg by mouth every 8 (eight) hours as needed for nausea or vomiting.   Yes [provider]  oxyCODONE (ROXICODONE) 15 MG immediate release tablet Take 1 tablet (15 mg total) by mouth every 4 (four) hours as needed for up to 15 days for pain (moderate to severe pain). 06/17/20 07/13/20 Yes Azzie Glatter, FNP  pantoprazole (PROTONIX) 40 MG tablet Take 1 tablet (40 mg total) by mouth daily. 05/10/19  Yes Lanae Boast, FNP  promethazine (PHENERGAN) 12.5 MG tablet Take 1 tablet (12.5 mg total) by mouth every 4 (four) hours as needed for nausea or vomiting. 02/25/20  Yes Vevelyn Francois, NP  sucralfate (CARAFATE) 1 GM/10ML suspension Take 10 mLs (1 g total) by mouth 4 (four) times daily for 21 days. Patient taking differently: Take 1 g by mouth 4 (four) times daily as needed. Pt takes as needed for stomach pain 05/16/20 07/13/20 Yes Jegede, [name], MD  tiZANidine (ZANAFLEX) 2 MG tablet TAKE 1 TABLET(2 MG) BY MOUTH EVERY 8 HOURS AS NEEDED FOR MUSCLE SPASMS Patient taking differently: Take 2 mg by mouth every 8 (eight) hours as needed for muscle spasms.  07/10/20  Yes  Vevelyn Francois, NP  ergocalciferol (VITAMIN D2) 1.25 MG (50000 UT) capsule Take 1 capsule (50,000 Units total) by mouth once a week. Patient not taking: Reported on 05/02/2020 05/10/19   Lanae Boast, FNP  NARCAN 4 MG/0.1ML LIQD nasal spray kit Place 4 mg into the nose once as needed (overdose).  12/22/17   [provider]      VITAL SIGNS:  Blood pressure 116/69, pulse 59, temperature (!) 97.1 F (36.2 C), temperature source Oral, resp. rate 13, height '5\' 3"'  (1.6 m), weight 63.5 kg, SpO2 98 %.  PHYSICAL EXAMINATION:  Physical Exam  GENERAL:  34 y.o.-year-old African-American female patient lying in the bed with no acute distress.  EYES: Pupils equal, round, reactive to light and accommodation. No scleral icterus. Extraocular muscles intact.  HEENT: Head atraumatic, normocephalic. Oropharynx and nasopharynx clear.  NECK:  Supple, no jugular  venous distention. No thyroid enlargement, no tenderness.  LUNGS: Normal breath sounds bilaterally, no wheezing, rales,rhonchi or crepitation. No use of accessory muscles of respiration.  CARDIOVASCULAR: Regular rate and rhythm, S1, S2 normal. No murmurs, rubs, or gallops.  ABDOMEN: Soft, nondistended, nontender. Bowel sounds present. No organomegaly or mass.  EXTREMITIES: No pedal edema, cyanosis, or clubbing.  NEUROLOGIC: Cranial nerves II through XII are intact. Muscle strength 5/5 in all extremities. Sensation intact. Gait not checked.  PSYCHIATRIC: The patient is alert and oriented x 3.  Normal affect and good eye contact. SKIN: No obvious rash, lesion, or ulcer.   LABORATORY PANEL:   CBC Recent Labs  Lab 07/13/20 2310  WBC 12.4*  HGB 10.8*  HCT 30.3*  PLT 357   ------------------------------------------------------------------------------------------------------------------  Chemistries  Recent Labs  Lab 07/13/20 2100  NA 141  K 4.4  CL 104  CO2 23  GLUCOSE 83  BUN 10  CREATININE 0.78  CALCIUM 10.0  AST 29  ALT 22    ALKPHOS 58  BILITOT 0.7   ------------------------------------------------------------------------------------------------------------------  Cardiac Enzymes No results for input(s): TROPONINI in the last 168 hours. ------------------------------------------------------------------------------------------------------------------  RADIOLOGY:  CT ABDOMEN PELVIS W CONTRAST  Result Date: 07/13/2020 CLINICAL DATA:  Nausea, vomiting, abdominal pain, sickle cell disease EXAM: CT ABDOMEN AND PELVIS WITH CONTRAST TECHNIQUE: Multidetector CT imaging of the abdomen and pelvis was performed using the standard protocol following bolus administration of intravenous contrast. CONTRAST:  18m OMNIPAQUE IOHEXOL 300 MG/ML  SOLN COMPARISON:  None. FINDINGS: Lower chest: The visualized lung bases are clear bilaterally. The visualized heart and pericardium are unremarkable. Hepatobiliary: Liver unremarkable. No intra or extrahepatic biliary ductal dilation. Gallbladder unremarkable. Pancreas: Pancreas unremarkable. 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 No acute infarction identified. Adrenals/Urinary Tract: Adrenal glands are unremarkable. Kidneys are unremarkable. Bladder is unremarkable. Stomach/Bowel: The stomach, small bowel, and large bowel are unremarkable. Appendix normal. No free intraperitoneal gas or fluid. Vascular/Lymphatic: The abdominal vasculature is normal. No pathologic adenopathy within the abdomen and pelvis. Reproductive: Focal hypodensity within the posterior fundal myometrium may represent a small fibroid. The pelvic organs are otherwise unremarkable. Other: Rectum unremarkable. Musculoskeletal: No acute bone abnormality. The osseous structures are unremarkable. IMPRESSION: Changes within the spleen in keeping with progressive are low infarction related to the patient's given history of sickle cell  disease. No acute infarct identified. Otherwise normal examination. Electronically Signed   By: AFidela SalisburyMD   On: 07/13/2020 22:08      IMPRESSION AND PLAN:   1.  Sickle cell pain crisis. -The patient was admitted to a telemetry bed. -Pain management will be provided with as needed IV Toradol as well as IV Dilaudid in addition to her home pain medications.. -O2 will be placed at 2 L/min by nasal cannula. -She will be hydrated with IV normal saline. -As needed antiemetics will be provided.  2.  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 -Pending clarification from the radiologist given her nominal pain we will place her on IV heparin for now.  3.  GERD/history of peptic ulcer disease. -PPI therapy and Carafate will be continued.  4.  DVT prophylaxis. -The patient be on IV heparin as mentioned above.   All the records are reviewed and case discussed with ED provider. The plan of care was discussed in details with the patient (and family). I answered all questions. The patient agreed to proceed with the above mentioned plan. Further management  will depend upon hospital course.   CODE STATUS: Full code  Status is: Inpatient  Remains inpatient appropriate because:Ongoing active pain requiring inpatient pain management, Ongoing diagnostic testing needed not appropriate for outpatient work up, Unsafe d/c plan, IV treatments appropriate due to intensity of illness or inability to take PO and Inpatient level of care appropriate due to severity of illness   Dispo: The patient is from: Home              Anticipated d/c is to: Home              Anticipated d/c date is: 2 days              Patient currently is not medically stable to d/c.   TOTAL TIME TAKING CARE OF THIS PATIENT: 55 minutes.    Christel Mormon M.D on 07/14/2020 at 12:32 AM  [facility]   From 7 PM-7 AM, contact night-coverage [email or web address]  CC: Primary care physician; Vevelyn Francois, NP   Note: This  dictation was prepared with Dragon dictation along with smaller phrase technology. Any transcriptional typo errors that result from this process are unintentional.

## 2020-07-14 NOTE — Progress Notes (Signed)
Called to get report on patient coming to 1619. Spoke with Warehouse manager.

## 2020-07-14 NOTE — Progress Notes (Signed)
Hannah Hawkins is a 34 year old female with a medical history significant for sickle cell disease, chronic pain syndrome, opiate dependence and tolerance, history of polysubstance abuse, and history of anemia of chronic disease was admitted earlier this a.m. for sickle cell pain crisis. Patient continues to complain of pain primarily to joints.  She also endorses swelling to hands.  Pain intensity is 10/10 characterized as constant and aching.  Care plan: Change fluids to 0.45% saline at 100 mL/h Discontinue telemetry bed, new order for MedSurg.  All vital signs stable at this time. Decrease Toradol to 15 mg every 6 hours for total of 5 days   We will reassess in a.m.    Nolon Nations  APRN, MSN, FNP-C Patient Care [facility] [address] [place], [place] [address] [phone]

## 2020-07-15 DIAGNOSIS — D5700 Hb-SS disease with crisis, unspecified: Principal | ICD-10-CM

## 2020-07-15 LAB — BASIC METABOLIC PANEL
Anion gap: 9 (ref 5–15)
BUN: <5 mg/dL — ABNORMAL LOW (ref 6–20)
CO2: 27 mmol/L (ref 22–32)
Calcium: 8.7 mg/dL — ABNORMAL LOW (ref 8.9–10.3)
Chloride: 102 mmol/L (ref 98–111)
Creatinine, Ser: 0.75 mg/dL (ref 0.44–1.00)
GFR calc Af Amer: >60 mL/min (ref >60)
GFR calc non Af Amer: >60 mL/min (ref >60)
Glucose, Bld: 101 mg/dL — ABNORMAL HIGH (ref 70–99)
Potassium: 3.4 mmol/L — ABNORMAL LOW (ref 3.5–5.1)
Sodium: 138 mmol/L (ref 135–145)

## 2020-07-15 LAB — CBC WITH DIFFERENTIAL/PLATELET
Abs Immature Granulocytes: 0.02 10*3/uL (ref 0.00–0.07)
Basophils Absolute: 0.1 10*3/uL (ref 0.0–0.1)
Basophils Relative: 1 %
Eosinophils Absolute: 1.2 10*3/uL — ABNORMAL HIGH (ref 0.0–0.5)
Eosinophils Relative: 12 %
HCT: 30.9 % — ABNORMAL LOW (ref 36.0–46.0)
Hemoglobin: 11.2 g/dL — ABNORMAL LOW (ref 12.0–15.0)
Immature Granulocytes: 0 %
Lymphocytes Relative: 53 %
Lymphs Abs: 4.8 10*3/uL — ABNORMAL HIGH (ref 0.7–4.0)
MCH: 27.3 pg (ref 26.0–34.0)
MCHC: 36.2 g/dL — ABNORMAL HIGH (ref 30.0–36.0)
MCV: 75.4 fL — ABNORMAL LOW (ref 80.0–100.0)
Monocytes Absolute: 0.6 10*3/uL (ref 0.1–1.0)
Monocytes Relative: 6 %
Neutro Abs: 2.6 10*3/uL (ref 1.7–7.7)
Neutrophils Relative %: 28 %
Platelets: 369 10*3/uL (ref 150–400)
RBC: 4.1 MIL/uL (ref 3.87–5.11)
RDW: 13.2 % (ref 11.5–15.5)
WBC: 9.3 10*3/uL (ref 4.0–10.5)
nRBC: 0.9 % — ABNORMAL HIGH (ref 0.0–0.2)

## 2020-07-15 LAB — RAPID URINE DRUG SCREEN, HOSP PERFORMED
Amphetamines: NOT DETECTED
Barbiturates: NOT DETECTED
Benzodiazepines: NOT DETECTED
Cocaine: NOT DETECTED
Opiates: POSITIVE — AB
Tetrahydrocannabinol: NOT DETECTED

## 2020-07-15 LAB — RETICULOCYTES
Immature Retic Fract: 23 % — ABNORMAL HIGH (ref 2.3–15.9)
RBC.: 4.07 MIL/uL (ref 3.87–5.11)
Retic Count, Absolute: 100.1 10*3/uL (ref 19.0–186.0)
Retic Ct Pct: 2.5 % (ref 0.4–3.1)

## 2020-07-15 MED ORDER — HYDROMORPHONE 1 MG/ML IV SOLN
INTRAVENOUS | Status: DC
  Administered 2020-07-15: 4.1 mg via INTRAVENOUS
  Administered 2020-07-15: 30 mg via INTRAVENOUS
  Administered 2020-07-16: 5.7 mg via INTRAVENOUS
  Administered 2020-07-16: 1.2 mg via INTRAVENOUS
  Administered 2020-07-16: 3.3 mg via INTRAVENOUS
  Administered 2020-07-16: 0.6 mg via INTRAVENOUS
  Administered 2020-07-16: 1.3 mg via INTRAVENOUS
  Administered 2020-07-16: 2.1 mg via INTRAVENOUS
  Administered 2020-07-16: 0.9 mg via INTRAVENOUS
  Administered 2020-07-17: 3.6 mg via INTRAVENOUS
  Administered 2020-07-17: 0 mg via INTRAVENOUS
  Administered 2020-07-17: 2.4 mg via INTRAVENOUS
  Administered 2020-07-17: 30 mg via INTRAVENOUS
  Administered 2020-07-17: 1.5 mg via INTRAVENOUS
  Filled 2020-07-15 (×2): qty 30

## 2020-07-15 MED ORDER — SODIUM CHLORIDE 0.9% FLUSH: 9.0000 mL | INTRAVENOUS | Status: DC | PRN

## 2020-07-15 MED ORDER — NALOXONE HCL 0.4 MG/ML IJ SOLN: 0.4000 mg | INTRAMUSCULAR | Status: DC | PRN

## 2020-07-15 MED ORDER — ONDANSETRON HCL 4 MG/2ML IJ SOLN: 4.0000 mg | Freq: Four times a day (QID) | INTRAMUSCULAR | Status: DC | PRN

## 2020-07-15 MED ORDER — DIPHENHYDRAMINE HCL 12.5 MG/5ML PO ELIX: 12.5000 mg | ORAL_SOLUTION | Freq: Four times a day (QID) | ORAL | Status: DC | PRN

## 2020-07-15 MED ORDER — DIPHENHYDRAMINE HCL 50 MG/ML IJ SOLN: 12.5000 mg | Freq: Four times a day (QID) | INTRAMUSCULAR | Status: DC | PRN

## 2020-07-15 NOTE — Progress Notes (Signed)
Subjective: Hannah Hawkins is a 34 year old female with a medical history significant for sickle cell disease, chronic pain syndrome, opiate dependence and tolerance, history of polysubstance abuse, and history of anemia of chronic disease was admitted for sickle cell pain crisis.  Patient says that pain has not improved overnight.  Pain intensity is 8/10 primarily to lower extremities.  Patient characterizes pain as constant and aching.  She denies any headache, chest pain, shortness of breath, urinary symptoms, nausea, vomiting, or diarrhea.  Objective:  Vital signs in last 24 hours:  Vitals:   07/14/20 2050 07/15/20 0050 07/15/20 0452 07/15/20 0952  BP: 118/80 (!) 132/82 (!) 128/90 (!) 114/97  Pulse: 61 74 59 59  Resp: 17 17 18 15   Temp: 98 F (36.7 C) 97.6 F (36.4 C) 97.7 F (36.5 C) 97.8 F (36.6 C)  TempSrc: Oral Oral Oral Oral  SpO2: 98% 98% 100% 100%  Weight:      Height:        Intake/Output from previous day:   Intake/Output Summary (Last 24 hours) at 07/15/2020 1611 Last data filed at 07/15/2020 0930 Gross per 24 hour  Intake 1564.04 ml  Output --  Net 1564.04 ml    Physical Exam: General: Alert, awake, oriented x3, in no acute distress.  HEENT: /AT PEERL, EOMI Neck: Trachea midline,  no masses, no thyromegal,y no JVD, no carotid bruit OROPHARYNX:  Moist, No exudate/ erythema/lesions.  Heart: Regular rate and rhythm, without murmurs, rubs, gallops, PMI non-displaced, no heaves or thrills on palpation.  Lungs: Clear to auscultation, no wheezing or rhonchi noted. No increased vocal fremitus resonant to percussion  Abdomen: Soft, nontender, nondistended, positive bowel sounds, no masses no hepatosplenomegaly noted..  Neuro: No focal neurological deficits noted cranial nerves II through XII grossly intact. DTRs 2+ bilaterally upper and lower extremities. Strength 5 out of 5 in bilateral upper and lower extremities. Musculoskeletal: No warm swelling or erythema  around joints, no spinal tenderness noted. Psychiatric: Patient alert and oriented x3, good insight and cognition, good recent to remote recall. Lymph node survey: No cervical axillary or inguinal lymphadenopathy noted.  Lab Results:  Basic Metabolic Panel:    Component Value Date/Time   NA 138 07/15/2020 0540   NA 138 03/07/2020 1532   K 3.4 (L) 07/15/2020 0540   CL 102 07/15/2020 0540   CO2 27 07/15/2020 0540   BUN <5 (L) 07/15/2020 0540   BUN 6 03/07/2020 1532   CREATININE 0.75 07/15/2020 0540   CREATININE 0.69 08/04/2017 1208   GLUCOSE 101 (H) 07/15/2020 0540   CALCIUM 8.7 (L) 07/15/2020 0540   CBC:    Component Value Date/Time   WBC 9.3 07/15/2020 0540   HGB 11.2 (L) 07/15/2020 0540   HGB 9.9 (L) 03/07/2020 1532   HCT 30.9 (L) 07/15/2020 0540   HCT 28.7 (L) 03/07/2020 1532   PLT 369 07/15/2020 0540   PLT 311 03/07/2020 1532   MCV 75.4 (L) 07/15/2020 0540   MCV 89 03/07/2020 1532   NEUTROABS 2.6 07/15/2020 0540   NEUTROABS 4.7 03/07/2020 1532   LYMPHSABS 4.8 (H) 07/15/2020 0540   LYMPHSABS 5.0 (H) 03/07/2020 1532   MONOABS 0.6 07/15/2020 0540   EOSABS 1.2 (H) 07/15/2020 0540   EOSABS 0.7 (H) 03/07/2020 1532   BASOSABS 0.1 07/15/2020 0540   BASOSABS 0.2 03/07/2020 1532    Recent Results (from the past 240 hour(s))  SARS Coronavirus 2 by RT PCR (hospital order, performed in [facility] hospital lab) Nasopharyngeal Nasopharyngeal Swab  Status: None   Collection Time: 07/13/20 10:37 PM   Specimen: Nasopharyngeal Swab  Result Value Ref Range Status   SARS Coronavirus 2 NEGATIVE NEGATIVE Final    Comment: (NOTE) SARS-CoV-2 target nucleic acids are NOT DETECTED.  The SARS-CoV-2 RNA is generally detectable in upper and lower respiratory specimens during the acute phase of infection. The lowest concentration of SARS-CoV-2 viral copies this assay can detect is 250 copies / mL. A negative result does not preclude SARS-CoV-2 infection and should not be used as the  sole basis for treatment or other patient management decisions.  A negative result may occur with improper specimen collection / handling, submission of specimen other than nasopharyngeal swab, presence of viral mutation(s) within the areas targeted by this assay, and inadequate number of viral copies (<250 copies / mL). A negative result must be combined with clinical observations, patient history, and epidemiological information.  Fact Sheet for Patients:   BoilerBrush.com.cy  Fact Sheet for Healthcare Providers: https://pope.com/  This test is not yet approved or  cleared by the [place] FDA and has been authorized for detection and/or diagnosis of SARS-CoV-2 by FDA under an Emergency Use Authorization (EUA).  This EUA will remain in effect (meaning this test can be used) for the duration of the COVID-19 declaration under Section 564(b)(1) of the Act, 21 U.S.C. section 360bbb-3(b)(1), unless the authorization is terminated or revoked sooner.  Performed at [facility], [address] [place], [place] [address]     Studies/Results: CT ABDOMEN PELVIS W CONTRAST  Result Date: 07/13/2020 CLINICAL DATA:  Nausea, vomiting, abdominal pain, sickle cell disease EXAM: CT ABDOMEN AND PELVIS WITH CONTRAST TECHNIQUE: Multidetector CT imaging of the abdomen and pelvis was performed using the standard protocol following bolus administration of intravenous contrast. CONTRAST:  OMNIPAQUE IOHEXOL 300 MG/ML  SOLN COMPARISON:  None. FINDINGS: Lower chest: The visualized lung bases are clear bilaterally. The visualized heart and pericardium are unremarkable. Hepatobiliary: Liver unremarkable. No intra or extrahepatic biliary ductal dilation. Gallbladder unremarkable. Pancreas: Pancreas unremarkable. Spleen: The spleen is small and somewhat nodular in keeping with given history of sickle cell disease and associated auto  infarction. No acute infarction identified. Adrenals/Urinary Tract: Adrenal glands are unremarkable. Kidneys are unremarkable. Bladder is unremarkable. Stomach/Bowel: The stomach, small bowel, and large bowel are unremarkable. Appendix normal. No free intraperitoneal gas or fluid. Vascular/Lymphatic: The abdominal vasculature is normal. No pathologic adenopathy within the abdomen and pelvis. Reproductive: Focal hypodensity within the posterior fundal myometrium may represent a small fibroid. The pelvic organs are otherwise unremarkable. Other: Rectum unremarkable. Musculoskeletal: No acute bone abnormality. The osseous structures are unremarkable. IMPRESSION: Changes within the spleen in keeping with progressive are low infarction related to the patient's given history of sickle cell disease. No acute infarct identified. Otherwise normal examination. Electronically Signed   By: Helyn Numbers MD   On: 07/13/2020 22:08    Medications: Scheduled Meds: . citalopram  20 mg Oral Daily  . ketorolac  15 mg Intravenous Q6H  . loratadine  10 mg Oral QPM  . montelukast  10 mg Oral QHS  . morphine  30 mg Oral Q12H  . pantoprazole  40 mg Oral Daily  . senna-docusate  1 tablet Oral BID   Continuous Infusions: . sodium chloride 10 mL/hr at 07/15/20 0520   PRN Meds:.albuterol, alum & mag hydroxide-simeth, HYDROmorphone (DILAUDID) injection, lidocaine, ondansetron **OR** ondansetron (ZOFRAN) IV, oxyCODONE, polyethylene glycol, promethazine, sucralfate, tiZANidine  Consultants:  None  Procedures:  None  Antibiotics:  None  Assessment/Plan: Active Problems:   Sickle cell pain crisis (HCC)   Sickle cell disease with pain crisis: Discontinue IV Dilaudid per clinician assistant dose, add IV Dilaudid full dose PCA for greater pain control. Toradol 15 mg IV every 6 hours for total of 5 days Oxycodone 15 mg every 4 hours as needed for severe breakthrough pain Monitor vital signs closely, reevaluate  pain scale regularly, and supplemental oxygen as needed.  Chronic pain syndrome: Continue home medications  Hypokalemia: Mild hypokalemia.  Replete.  Follow BMP in a.m.  Sickle cell anemia: Hemoglobin is 11.2, consistent with patient's baseline.  There is no clinical indication for blood transfusion at this time.  Continue to follow closely.  History of polysubstance abuse: Reviewed UDS.  Negative for illicit substances.  Patient counseled extensively.  Code Status: Full Code Family Communication: N/A Disposition Plan: Not yet ready for discharge  Hannah Hawkins Rennis Petty  APRN, MSN, FNP-C Patient Care Center [facility] [address] [place], [place] [address] [phone]  If 5PM-8AM, please contact night-coverage.  07/15/2020, 4:11 PM  LOS: 1 day

## 2020-07-15 NOTE — Progress Notes (Signed)
MD, Patient needs VTE prophylaxis ordered. Thanks!Priscille Kluver

## 2020-07-16 ENCOUNTER — Telehealth: Payer: Self-pay | Admitting: Nurse Practitioner

## 2020-07-16 DIAGNOSIS — R1110 Vomiting, unspecified: Secondary | ICD-10-CM

## 2020-07-16 DIAGNOSIS — R112 Nausea with vomiting, unspecified: Secondary | ICD-10-CM

## 2020-07-16 DIAGNOSIS — G894 Chronic pain syndrome: Secondary | ICD-10-CM

## 2020-07-16 DIAGNOSIS — D638 Anemia in other chronic diseases classified elsewhere: Secondary | ICD-10-CM

## 2020-07-16 DIAGNOSIS — E876 Hypokalemia: Secondary | ICD-10-CM

## 2020-07-16 LAB — CBC WITH DIFFERENTIAL/PLATELET
Abs Immature Granulocytes: 0.04 10*3/uL (ref 0.00–0.07)
Basophils Absolute: 0.1 10*3/uL (ref 0.0–0.1)
Basophils Relative: 1 %
Eosinophils Absolute: 0.9 10*3/uL — ABNORMAL HIGH (ref 0.0–0.5)
Eosinophils Relative: 11 %
HCT: 30.6 % — ABNORMAL LOW (ref 36.0–46.0)
Hemoglobin: 10.9 g/dL — ABNORMAL LOW (ref 12.0–15.0)
Immature Granulocytes: 1 %
Lymphocytes Relative: 48 %
Lymphs Abs: 4.1 10*3/uL — ABNORMAL HIGH (ref 0.7–4.0)
MCH: 26.4 pg (ref 26.0–34.0)
MCHC: 35.6 g/dL (ref 30.0–36.0)
MCV: 74.1 fL — ABNORMAL LOW (ref 80.0–100.0)
Monocytes Absolute: 0.6 10*3/uL (ref 0.1–1.0)
Monocytes Relative: 7 %
Neutro Abs: 2.7 10*3/uL (ref 1.7–7.7)
Neutrophils Relative %: 32 %
Platelets: 349 10*3/uL (ref 150–400)
RBC: 4.13 MIL/uL (ref 3.87–5.11)
RDW: 13.2 % (ref 11.5–15.5)
WBC: 8.3 10*3/uL (ref 4.0–10.5)
nRBC: 1.2 % — ABNORMAL HIGH (ref 0.0–0.2)

## 2020-07-16 LAB — BASIC METABOLIC PANEL
Anion gap: 7 (ref 5–15)
BUN: 6 mg/dL (ref 6–20)
CO2: 28 mmol/L (ref 22–32)
Calcium: 8.8 mg/dL — ABNORMAL LOW (ref 8.9–10.3)
Chloride: 101 mmol/L (ref 98–111)
Creatinine, Ser: 0.62 mg/dL (ref 0.44–1.00)
GFR calc Af Amer: >60 mL/min (ref >60)
GFR calc non Af Amer: >60 mL/min (ref >60)
Glucose, Bld: 128 mg/dL — ABNORMAL HIGH (ref 70–99)
Potassium: 3.5 mmol/L (ref 3.5–5.1)
Sodium: 136 mmol/L (ref 135–145)

## 2020-07-16 LAB — RETICULOCYTES
Immature Retic Fract: 20.8 % — ABNORMAL HIGH (ref 2.3–15.9)
RBC.: 4.23 MIL/uL (ref 3.87–5.11)
Retic Count, Absolute: 93.5 10*3/uL (ref 19.0–186.0)
Retic Ct Pct: 2.2 % (ref 0.4–3.1)

## 2020-07-16 MED ORDER — LIDOCAINE 5 % EX PTCH
1.0000 | MEDICATED_PATCH | CUTANEOUS | Status: AC
  Administered 2020-07-16: 1 via TRANSDERMAL
  Filled 2020-07-16: qty 1

## 2020-07-16 NOTE — Telephone Encounter (Signed)
In hosp, needs call returned asap

## 2020-07-16 NOTE — Telephone Encounter (Signed)
Please see what she needs 

## 2020-07-16 NOTE — Progress Notes (Signed)
Subjective: Hannah Hawkins is a 34 year old female with a medical history significant for sickle cell disease, chronic pain syndrome, opiate dependence and tolerance, history of polysubstance abuse, and history of anemia of chronic disease was admitted for sickle cell pain crisis.  Patient states that pain is improving some.  Is primarily to neck, back, and lower extremities.  Patient does not have any new complaints on today.  She denies any headache, chest pain, shortness of breath, urinary symptoms, nausea, vomiting, or diarrhea.  Objective:  Vital signs in last 24 hours:  Vitals:   07/16/20 0429 07/16/20 0520 07/16/20 0736 07/16/20 1011  BP:  (!) 160/49  124/83  Pulse:  64  59  Resp: 14 16 18 18   Temp:  98 F (36.7 C)  98.2 F (36.8 C)  TempSrc:  Oral  Oral  SpO2: 98%   100%  Weight:  69.2 kg    Height:        Intake/Output from previous day:   Intake/Output Summary (Last 24 hours) at 07/16/2020 1244 Last data filed at 07/15/2020 1330 Gross per 24 hour  Intake 200 ml  Output --  Net 200 ml    Physical Exam: General: Alert, awake, oriented x3, in no acute distress.  HEENT: [place]/AT PEERL, EOMI Neck: Trachea midline,  no masses, no thyromegal,y no JVD, no carotid bruit OROPHARYNX:  Moist, No exudate/ erythema/lesions.  Heart: Regular rate and rhythm, without murmurs, rubs, gallops, PMI non-displaced, no heaves or thrills on palpation.  Lungs: Clear to auscultation, no wheezing or rhonchi noted. No increased vocal fremitus resonant to percussion  Abdomen: Soft, nontender, nondistended, positive bowel sounds, no masses no hepatosplenomegaly noted..  Neuro: No focal neurological deficits noted cranial nerves II through XII grossly intact. DTRs 2+ bilaterally upper and lower extremities. Strength 5 out of 5 in bilateral upper and lower extremities. Musculoskeletal: No warm swelling or erythema around joints, no spinal tenderness noted. Psychiatric: Patient alert and oriented  x3, good insight and cognition, good recent to remote recall. Lymph node survey: No cervical axillary or inguinal lymphadenopathy noted.  Lab Results:  Basic Metabolic Panel:    Component Value Date/Time   NA 136 07/16/2020 0550   NA 138 03/07/2020 1532   K 3.5 07/16/2020 0550   CL 101 07/16/2020 0550   CO2 28 07/16/2020 0550   BUN 6 07/16/2020 0550   BUN 6 03/07/2020 1532   CREATININE 0.62 07/16/2020 0550   CREATININE 0.69 08/04/2017 1208   GLUCOSE 128 (H) 07/16/2020 0550   CALCIUM 8.8 (L) 07/16/2020 0550   CBC:    Component Value Date/Time   WBC 8.3 07/16/2020 0550   HGB 10.9 (L) 07/16/2020 0550   HGB 9.9 (L) 03/07/2020 1532   HCT 30.6 (L) 07/16/2020 0550   HCT 28.7 (L) 03/07/2020 1532   PLT 349 07/16/2020 0550   PLT 311 03/07/2020 1532   MCV 74.1 (L) 07/16/2020 0550   MCV 89 03/07/2020 1532   NEUTROABS 2.7 07/16/2020 0550   NEUTROABS 4.7 03/07/2020 1532   LYMPHSABS 4.1 (H) 07/16/2020 0550   LYMPHSABS 5.0 (H) 03/07/2020 1532   MONOABS 0.6 07/16/2020 0550   EOSABS 0.9 (H) 07/16/2020 0550   EOSABS 0.7 (H) 03/07/2020 1532   BASOSABS 0.1 07/16/2020 0550   BASOSABS 0.2 03/07/2020 1532    Recent Results (from the past 240 hour(s))  SARS Coronavirus 2 by RT PCR (hospital order, performed in [facility] hospital lab) Nasopharyngeal Nasopharyngeal Swab     Status: None  Collection Time: 07/13/20 10:37 PM   Specimen: Nasopharyngeal Swab  Result Value Ref Range Status   SARS Coronavirus 2 NEGATIVE NEGATIVE Final    Comment: (NOTE) SARS-CoV-2 target nucleic acids are NOT DETECTED.  The SARS-CoV-2 RNA is generally detectable in upper and lower respiratory specimens during the acute phase of infection. The lowest concentration of SARS-CoV-2 viral copies this assay can detect is 250 copies / mL. A negative result does not preclude SARS-CoV-2 infection and should not be used as the sole basis for treatment or other patient management decisions.  A negative result may occur  with improper specimen collection / handling, submission of specimen other than nasopharyngeal swab, presence of viral mutation(s) within the areas targeted by this assay, and inadequate number of viral copies (<250 copies / mL). A negative result must be combined with clinical observations, patient history, and epidemiological information.  Fact Sheet for Patients:   BoilerBrush.com.cy  Fact Sheet for Healthcare Providers: https://pope.com/  This test is not yet approved or  cleared by the [place] FDA and has been authorized for detection and/or diagnosis of SARS-CoV-2 by FDA under an Emergency Use Authorization (EUA).  This EUA will remain in effect (meaning this test can be used) for the duration of the COVID-19 declaration under Section 564(b)(1) of the Act, 21 U.S.C. section 360bbb-3(b)(1), unless the authorization is terminated or revoked sooner.  Performed at [facility], [address], [place], [place] [address]     Studies/Results: No results found.  Medications: Scheduled Meds:  citalopram  20 mg Oral Daily   HYDROmorphone   Intravenous Q4H   ketorolac  15 mg Intravenous Q6H   loratadine  10 mg Oral QPM   montelukast  10 mg Oral QHS   morphine  30 mg Oral Q12H   pantoprazole  40 mg Oral Daily   senna-docusate  1 tablet Oral BID   Continuous Infusions:  sodium chloride 10 mL/hr at 07/15/20 0520   PRN Meds:.albuterol, alum & mag hydroxide-simeth, diphenhydrAMINE **OR** diphenhydrAMINE, lidocaine, naloxone **AND** sodium chloride flush, ondansetron **OR** ondansetron (ZOFRAN) IV, oxyCODONE, polyethylene glycol, promethazine, sucralfate, tiZANidine  Consultants:  None  Procedures:  None  Antibiotics:  None  Assessment/Plan: Principal Problem:   Sickle cell pain crisis (HCC) Active Problems:   Anemia of chronic disease   Chronic pain syndrome   Hypokalemia  Sickle  cell disease with pain crisis: Continue full dose PCA, no changes in settings on today Toradol 15 mg IV every 6 hours for total of 5 days Oxycodone 15 mg every 4 hours as needed for severe breakthrough pain Monitor vital signs closely, reevaluate pain scale regularly, and supplemental oxygen as needed.  Chronic pain syndrome: Continue home medications  Hypokalemia: Resolved.  Continue to follow BMP.  Sickle cell anemia: Hemoglobin stable, consistent with patient's baseline.  There is no clinical indication for blood transfusion at this time.  Continue to follow closely.  History of polysubstance abuse: Reviewed UDS.  Negative for illicit substances.   Code Status: Full Code Family Communication: N/A Disposition Plan: Not yet ready for discharge  Caretha Rumbaugh Rennis Petty  APRN, MSN, FNP-C Patient Care Center [facility] [address] [place], [place] [address] [phone] If 5PM-8AM, please contact night-coverage.  07/16/2020, 12:44 PM  LOS: 2 days

## 2020-07-16 NOTE — Telephone Encounter (Signed)
Called, no answer. Left a message to call back.  

## 2020-07-17 DIAGNOSIS — D5720 Sickle-cell/Hb-C disease without crisis: Secondary | ICD-10-CM

## 2020-07-17 LAB — CBC WITH DIFFERENTIAL/PLATELET
Abs Immature Granulocytes: 0.02 10*3/uL (ref 0.00–0.07)
Basophils Absolute: 0.1 10*3/uL (ref 0.0–0.1)
Basophils Relative: 1 %
Eosinophils Absolute: 1.1 10*3/uL — ABNORMAL HIGH (ref 0.0–0.5)
Eosinophils Relative: 12 %
HCT: 30.8 % — ABNORMAL LOW (ref 36.0–46.0)
Hemoglobin: 11.2 g/dL — ABNORMAL LOW (ref 12.0–15.0)
Immature Granulocytes: 0 %
Lymphocytes Relative: 49 %
Lymphs Abs: 4.2 10*3/uL — ABNORMAL HIGH (ref 0.7–4.0)
MCH: 27 pg (ref 26.0–34.0)
MCHC: 36.4 g/dL — ABNORMAL HIGH (ref 30.0–36.0)
MCV: 74.2 fL — ABNORMAL LOW (ref 80.0–100.0)
Monocytes Absolute: 0.7 10*3/uL (ref 0.1–1.0)
Monocytes Relative: 8 %
Neutro Abs: 2.6 10*3/uL (ref 1.7–7.7)
Neutrophils Relative %: 30 %
Platelets: 331 10*3/uL (ref 150–400)
RBC: 4.15 MIL/uL (ref 3.87–5.11)
RDW: 13.2 % (ref 11.5–15.5)
WBC: 8.6 10*3/uL (ref 4.0–10.5)
nRBC: 1.9 % — ABNORMAL HIGH (ref 0.0–0.2)

## 2020-07-17 LAB — RETICULOCYTES
Immature Retic Fract: 23.5 % — ABNORMAL HIGH (ref 2.3–15.9)
RBC.: 4.18 MIL/uL (ref 3.87–5.11)
Retic Count, Absolute: 102.4 10*3/uL (ref 19.0–186.0)
Retic Ct Pct: 2.5 % (ref 0.4–3.1)

## 2020-07-17 MED ORDER — MORPHINE SULFATE ER 30 MG PO TBCR: 30.0000 mg | EXTENDED_RELEASE_TABLET | Freq: Two times a day (BID) | ORAL | 0 refills | Status: AC

## 2020-07-17 MED ORDER — OXYCODONE HCL 15 MG PO TABS: 15.0000 mg | ORAL_TABLET | ORAL | 0 refills | Status: DC | PRN

## 2020-07-17 NOTE — Discharge Instructions (Signed)

## 2020-07-17 NOTE — Discharge Summary (Signed)
Physician Discharge Summary  Hannah Hawkins YBO:[id number] DOB: 04-11-1986 DOA: 07/13/2020  PCP: Vevelyn Francois, NP  Admit date: 07/13/2020  Discharge date: 07/17/2020  Discharge Diagnoses:  Principal Problem:   Sickle cell pain crisis ([place]) Active Problems:   Anemia of chronic disease   Chronic pain syndrome   Hypokalemia   Non-intractable vomiting   Discharge Condition: Stable  Disposition:  Pt is discharged home in good condition and is to follow up with Vevelyn Francois, NP this week to have labs evaluated. Hannah Hawkins is instructed to increase activity slowly and balance with rest for the next few days, and use prescribed medication to complete treatment of pain  Diet: Regular Wt Readings from Last 3 Encounters:  07/16/20 69.2 kg  06/15/20 63.5 kg  06/11/20 67.4 kg    History of present illness:  Hannah Hawkins is a 34 year old African-American female with a known history of sickle cell disease, GERD with peptic ulcer disease, chronic pain syndrome, opiate dependence and tolerance, and history of polysubstance abuse presented to the emergency room to consider bilateral leg and ankle pain as well as upper quadrant abdominal pain with associated nausea without vomiting at home.  However she did vomit after Dilaudid here.  She denies any fever or chills.  No chest pain or cough or wheezing or hemoptysis.  She admits to associated bilateral hand pain as well as mild upper back pain.  No dysuria, oliguria, or hematuria, or flank pain.  No bleeding diathesis.  Upon presentation to the emergency room, temperature was 97.1 with blood pressure at 119/86, heart rate of 54, and respiratory rate of 11.  Pulse ox 95% on RA.  Labs revealed unremarkable complete metabolic panel.  Lactic acid was 3.3 and CBC showed mild leukocytosis of 12.4 with neutrophilia and anemia that is chronic at baseline.  Reticulocyte count is 3.2.  Pregnancy test negative.  COVID-19 PCR is negative.  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  The patient was given 2 mg of IV Dilaudid twice, Roxicodone 15 mg, Protonix 40 mg and half normal saline bolus.  Patient admitted to medical bed for further evaluation and management of sickle cell pain crisis.   Hospital Course:  Sickle cell disease with pain crisis: Patient was admitted for sickle cell pain crisis and managed appropriately with IVF, IV Dilaudid via PCA and IV Toradol, as well as other adjunct therapies per sickle cell pain management protocols.  Patient was started on full dose PCA Dilaudid.  She was transitioned to home medications.  MS Contin was continued without interruption.  Patient attributed crisis to being out of pain medications.  Pain medications were stopped by PCP several weeks ago due to patient's ongoing polysubstance abuse.  During this admission, no illicit drugs were found in drug screen.  Patient was prescribed MS Contin 30 mg every 12 hours #14 and oxycodone 15 mg every 4 hours as needed for severe breakthrough pain #15 to complete treatment.  Patient advised to follow-up with PCP for medication management.  She has a long history of chronic pain syndrome.  Recommend drug counseling and rehabilitation so that she can remain drug-free in order to manage her sickle cell disease and chronic pain syndrome appropriately.  Patient will discuss further with PCP going forward. Current pain intensity is 5/10 primarily to low back and lower extremities.  Patient is alert, oriented, and ambulating without assistance.  Patient will resume all home medications.  She is afebrile and oxygen saturation is 98% on RA.  Patient was discharged home today in a hemodynamically stable condition.   Discharge Exam: Vitals:   07/17/20 0745 07/17/20 0936  BP:  118/76  Pulse:  79  Resp: 16 15  Temp:  98 F (36.7 C)  SpO2: 98% 100%   Vitals:    07/17/20 0402 07/17/20 0631 07/17/20 0745 07/17/20 0936  BP:  (!) 120/86  118/76  Pulse:  67  79  Resp: _0 Temp:  97.9 F (36.6 C)  98 F (36.7 C)  TempSrc:  Oral  Oral  SpO2: 92% 99% 98% 100%  Weight:      Height:        General appearance : Awake, alert, not in any distress. Speech Clear. Not toxic looking HEENT: Atraumatic and Normocephalic, pupils equally reactive to light and accomodation Neck: Supple, no JVD. No cervical lymphadenopathy.  Chest: Good air entry bilaterally, no added sounds  CVS: S1 S2 regular, no murmurs.  Abdomen: Bowel sounds present, Non tender and not distended with no gaurding, rigidity or rebound. Extremities: B/L Lower Ext shows no edema, both legs are warm to touch Neurology: Awake alert, and oriented X 3, CN II-XII intact, Non focal Skin: No Rash  Discharge Instructions  Discharge Instructions    Discharge patient   Complete by: As directed    Discharge disposition: 01-Home or Self Care   Discharge patient date: 07/17/2020     Allergies as of 07/17/2020      Reactions   Penicillins Swelling, Rash   Has patient had a PCN reaction causing immediate rash, facial/tongue/throat swelling, SOB or lightheadedness with hypotension: Yes Has patient had a PCN reaction causing severe rash involving mucus membranes or skin necrosis: Yes Has patient had a PCN reaction that required hospitalization No Has patient had a PCN reaction occurring within the last 10 years: no If all of the above answers are "NO", then may proceed with Cephalosporin use.      Medication List    TAKE these medications   albuterol 108 (90 Base) MCG/ACT inhaler Commonly known as: VENTOLIN HFA Inhale 2 puffs into the lungs every 6 (six) hours as needed for wheezing or shortness of breath.   citalopram 10 MG tablet Commonly known as: CELEXA TAKE 1 TABLET(10 MG) BY MOUTH DAILY FOR 14 DAYS THEN TAKE 2 TABLETS(20 MG) BY MOUTH DAILY What changed: See the new  instructions.   ergocalciferol 1.25 MG (50000 UT) capsule Commonly known as: VITAMIN D2 Take 1 capsule (50,000 Units total) by mouth once a week.   ibuprofen 800 MG tablet Commonly known as: ADVIL Take 1 tablet (800 mg total) by mouth every 8 (eight) hours as needed for moderate pain. Take on full stomach.   levocetirizine 5 MG tablet Commonly known as: XYZAL Take 5 mg by mouth every evening.   lidocaine 5 % Commonly known as: Lidoderm Place 1 patch onto the skin daily. Remove & Discard patch within 12 hours or as directed by MD What changed:   when to take this  reasons to take this   montelukast 10 MG tablet Commonly known as: SINGULAIR Take 1 tablet (10 mg total) by mouth at bedtime.   morphine 30 MG 12 hr tablet Commonly known as: MS CONTIN Take 1 tablet (30 mg total) by mouth every 12 (twelve) hours for 7 days. Must last 30 days.   Narcan 4 MG/0.1ML Liqd nasal  spray kit Generic drug: naloxone Place 4 mg into the nose once as needed (overdose).   ondansetron 4 MG tablet Commonly known as: ZOFRAN Take 4 mg by mouth every 8 (eight) hours as needed for nausea or vomiting.   oxyCODONE 15 MG immediate release tablet Commonly known as: ROXICODONE Take 1 tablet (15 mg total) by mouth every 4 (four) hours as needed for up to 15 days for pain (moderate to severe pain).   pantoprazole 40 MG tablet Commonly known as: PROTONIX Take 1 tablet (40 mg total) by mouth daily.   promethazine 12.5 MG tablet Commonly known as: PHENERGAN Take 1 tablet (12.5 mg total) by mouth every 4 (four) hours as needed for nausea or vomiting.   sucralfate 1 GM/10ML suspension Commonly known as: Carafate Take 10 mLs (1 g total) by mouth 4 (four) times daily for 21 days. What changed:   when to take this  reasons to take this  additional instructions   tiZANidine 2 MG tablet Commonly known as: ZANAFLEX TAKE 1 TABLET(2 MG) BY MOUTH EVERY 8 HOURS AS NEEDED FOR MUSCLE SPASMS What  changed: See the new instructions.       The results of significant diagnostics from this hospitalization (including imaging, microbiology, ancillary and laboratory) are listed below for reference.    Significant Diagnostic Studies: CT ABDOMEN PELVIS W CONTRAST  Result Date: 07/13/2020 CLINICAL DATA:  Nausea, vomiting, abdominal pain, sickle cell disease EXAM: CT ABDOMEN AND PELVIS WITH CONTRAST TECHNIQUE: Multidetector CT imaging of the abdomen and pelvis was performed using the standard protocol following bolus administration of intravenous contrast. CONTRAST:  177m OMNIPAQUE IOHEXOL 300 MG/ML  SOLN COMPARISON:  None. FINDINGS: Lower chest: The visualized lung bases are clear bilaterally. The visualized heart and pericardium are unremarkable. Hepatobiliary: Liver unremarkable. No intra or extrahepatic biliary ductal dilation. Gallbladder unremarkable. Pancreas: Pancreas unremarkable. 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 No acute infarction identified. Adrenals/Urinary Tract: Adrenal glands are unremarkable. Kidneys are unremarkable. Bladder is unremarkable. Stomach/Bowel: The stomach, small bowel, and large bowel are unremarkable. Appendix normal. No free intraperitoneal gas or fluid. Vascular/Lymphatic: The abdominal vasculature is normal. No pathologic adenopathy within the abdomen and pelvis. Reproductive: Focal hypodensity within the posterior fundal myometrium may represent a small fibroid. The pelvic organs are otherwise unremarkable. Other: Rectum unremarkable. Musculoskeletal: No acute bone abnormality. The osseous structures are unremarkable. IMPRESSION: Changes within the spleen in keeping with progressive are low infarction related to the patient's given history of sickle cell disease. No acute infarct identified. Otherwise normal examination. Electronically Signed   By: AFidela SalisburyMD   On:  07/13/2020 22:08    Microbiology: Recent Results (from the past 240 hour(s))  SARS Coronavirus 2 by RT PCR ([facility] Nasopharyngeal Nasopharyngeal Swab     Status: None   Collection Time: 07/13/20 10:37 PM   Specimen: Nasopharyngeal Swab  Result Value Ref Range Status   SARS Coronavirus 2 NEGATIVE NEGATIVE Final    Comment: (NOTE) SARS-CoV-2 target nucleic acids are NOT DETECTED.  The SARS-CoV-2 RNA is generally detectable in upper and lower respiratory specimens during the acute phase of infection. The lowest concentration of SARS-CoV-2 viral copies this assay can detect is 250 copies / mL. A negative result does not preclude SARS-CoV-2 infection and should not be used as the sole basis for treatment or other patient management decisions.  A negative result may  occur with improper specimen collection / handling, submission of specimen other than nasopharyngeal swab, presence of viral mutation(s) within the areas targeted by this assay, and inadequate number of viral copies (<250 copies / mL). A negative result must be combined with clinical observations, patient history, and epidemiological information.  Fact Sheet for Patients:   StrictlyIdeas.no  Fact Sheet for Healthcare Providers: BankingDealers.co.za  This test is not yet approved or  cleared by the [place] FDA and has been authorized for detection and/or diagnosis of SARS-CoV-2 by FDA under an Emergency Use Authorization (EUA).  This EUA will remain in effect (meaning this test can be used) for the duration of the COVID-19 declaration under Section 564(b)(1) of the Act, 21 U.S.C. section 360bbb-3(b)(1), unless the authorization is terminated or revoked sooner.  Performed at [facility], [place] [address] [place], [place] [address]      Labs: Basic Metabolic Panel: Recent Labs  Lab 07/13/20 2100  07/13/20 2100 07/14/20 0500 07/14/20 0500 07/15/20 0540 07/16/20 0550  NA 141  --  136  --  138 136  K 4.4   < > 3.2*   < > 3.4* 3.5  CL 104  --  102  --  102 101  CO2 23  --  27  --  27 28  GLUCOSE 83  --  96  --  101* 128*  BUN 10  --  7  --  <5* 6  CREATININE 0.78  --  0.65  --  0.75 0.62  CALCIUM 10.0  --  8.5*  --  8.7* 8.8*   < > = values in this interval not displayed.   Liver Function Tests: Recent Labs  Lab 07/13/20 2100  AST 29  ALT 22  ALKPHOS 58  BILITOT 0.7  PROT 8.0  ALBUMIN 4.3   No results for input(s): LIPASE, AMYLASE in the last 168 hours. No results for input(s): AMMONIA in the last 168 hours. CBC: Recent Labs  Lab 07/13/20 2310 07/14/20 0500 07/15/20 0540 07/16/20 0550 07/17/20 0547  WBC 12.4* 12.6* 9.3 8.3 8.6  NEUTROABS 9.1* 6.8 2.6 2.7 2.6  HGB 10.8* 10.7* 11.2* 10.9* 11.2*  HCT 30.3* 29.1* 30.9* 30.6* 30.8*  MCV 74.6* 73.9* 75.4* 74.1* 74.2*  PLT 357 335 369 349 331   Cardiac Enzymes: No results for input(s): CKTOTAL, CKMB, CKMBINDEX, TROPONINI in the last 168 hours. BNP: Invalid input(s): POCBNP CBG: No results for input(s): GLUCAP in the last 168 hours.  Time coordinating discharge: 35 minutes  Signed:  Donia Pounds  APRN, MSN, FNP-C [facility] [place], [place] [address] [phone]  [facility] 07/17/2020, 11:32 AM

## 2020-07-24 ENCOUNTER — Other Ambulatory Visit: Payer: Self-pay

## 2020-07-24 ENCOUNTER — Ambulatory Visit (INDEPENDENT_AMBULATORY_CARE_PROVIDER_SITE_OTHER): Payer: Medicaid Other | Admitting: Nurse Practitioner

## 2020-07-24 ENCOUNTER — Encounter: Payer: Self-pay | Admitting: Nurse Practitioner

## 2020-07-24 VITALS — BP 122/83 | HR 66 | Temp 98.2°F | Resp 16 | Ht 63.0 in | Wt 143.8 lb

## 2020-07-24 DIAGNOSIS — G894 Chronic pain syndrome: Secondary | ICD-10-CM | POA: Diagnosis not present

## 2020-07-24 DIAGNOSIS — Z0000 Encounter for general adult medical examination without abnormal findings: Secondary | ICD-10-CM

## 2020-07-24 DIAGNOSIS — F1123 Opioid dependence with withdrawal: Secondary | ICD-10-CM | POA: Diagnosis not present

## 2020-07-24 DIAGNOSIS — F1490 Cocaine use, unspecified, uncomplicated: Secondary | ICD-10-CM | POA: Diagnosis not present

## 2020-07-24 DIAGNOSIS — R001 Bradycardia, unspecified: Secondary | ICD-10-CM | POA: Insufficient documentation

## 2020-07-24 DIAGNOSIS — Z87891 Personal history of nicotine dependence: Secondary | ICD-10-CM | POA: Diagnosis not present

## 2020-07-24 DIAGNOSIS — D5700 Hb-SS disease with crisis, unspecified: Secondary | ICD-10-CM | POA: Diagnosis present

## 2020-07-24 LAB — POCT URINALYSIS DIPSTICK
Bilirubin, UA: NEGATIVE
Blood, UA: NEGATIVE
Glucose, UA: NEGATIVE
Ketones, UA: NEGATIVE
Leukocytes, UA: NEGATIVE
Nitrite, UA: NEGATIVE
Protein, UA: NEGATIVE
Spec Grav, UA: 1.015 (ref 1.010–1.025)
Urobilinogen, UA: 1 E.U./dL
pH, UA: 5.5 (ref 5.0–8.0)

## 2020-07-24 NOTE — Progress Notes (Signed)
[place] [place], [place]  [address] Phone:  [phone]   Fax:  [phone]    Established Patient Office Visit  Subjective:  Patient ID: Hannah Hawkins, female    DOB: [birth year]  Age: 34 y.o. MRN: [id number]  CC:  Chief Complaint  Patient presents with   Follow-up    Pt states she is here for her f/u appt. Pt states she would like to discuss her medication.    Back Pain    Pt states she was released from the hospital  on 07/17/20    HPI Hannah Hawkins presents for follow up.  She is in today with her daughter: Hannah Hawkins.  She  has a past medical history of Sickle cell anemia ([place]).  She complains of 7-8 out of 10 on pain.  10 out of 10 back pain 8 out of 10 leg pain.  She admits that she tries to stay hydrated.  She is currently using ibuprofen, oxycodone, tizanidine and morphine.  She has not had her oxycodone because her inpatient drug screen on 06/12/2020 was positive for cocaine.  She received a refill oxycodone 15 mg quantity 60 06/17/2020.  On 06/25/2020 she was prescribed her morphine 30 mg quantity 60 twice daily because of the cocaine was not known to the primary care provider.  She did provide a urine drug screen on 07/04/2020 which was 2 days after she had agreed to complete it on 07/02/2020. She was given a reoccurrence of swelling in her hands and feet.  She admits that this has improved.  She denies fever, headache, cough, wheezing, shortness of breath, chest pains, abdominal pain . Denies any open wounds, skin irritation.  Past Medical History:  Diagnosis Date   Sickle cell anemia (HCC)     Past Surgical History:  Procedure Laterality Date   EYE SURGERY  03/09/2017   left eye   EYE SURGERY Right 09/2019   right [place]     Family History  Problem Relation Age of Onset   Diabetes Father     Social History   Socioeconomic History   Marital status: Single    Spouse name: Not on file   Number of children: Not on  file   Years of education: Not on file   Highest education level: Not on file  Occupational History   Not on file  Tobacco Use   Smoking status: Former Smoker    Types: Cigarettes   Smokeless tobacco: Never Used  Scientific laboratory technician Use: Never used  Substance and Sexual Activity   Alcohol use: Not Currently   Drug use: No   Sexual activity: Yes  Other Topics Concern   Not on file  Social History Narrative   Not on file   Social Determinants of Health   Financial Resource Strain:    Difficulty of Paying Living Expenses:   Food Insecurity:    Worried About Charity fundraiser in the Last Year:    Arboriculturist in the Last Year:   Transportation Needs:    Film/video editor (Medical):    Lack of Transportation (Non-Medical):   Physical Activity:    Days of Exercise per Week:    Minutes of Exercise per Session:   Stress:    Feeling of Stress :   Social Connections:    Frequency of Communication with Friends and Family:    Frequency of Social Gatherings with Friends and Family:  Attends Religious Services:    Active Member of Clubs or Organizations:    Attends Music therapist:    Marital Status:   Intimate Partner Violence:    Fear of Current or Ex-Partner:    Emotionally Abused:    Physically Abused:    Sexually Abused:     Outpatient Medications Prior to Visit  Medication Sig Dispense Refill   citalopram (CELEXA) 10 MG tablet TAKE 1 TABLET(10 MG) BY MOUTH DAILY FOR 14 DAYS THEN TAKE 2 TABLETS(20 MG) BY MOUTH DAILY (Patient taking differently: Take 20 mg by mouth daily. ) 90 tablet 0   ibuprofen (ADVIL) 800 MG tablet Take 1 tablet (800 mg total) by mouth every 8 (eight) hours as needed for moderate pain. Take on full stomach. 60 tablet 2   levocetirizine (XYZAL) 5 MG tablet Take 5 mg by mouth every evening.     lidocaine (LIDODERM) 5 % Place 1 patch onto the skin daily. Remove & Discard patch within 12 hours or  as directed by MD (Patient taking differently: Place 1 patch onto the skin every 12 (twelve) hours as needed (pain). Remove & Discard patch within 12 hours or as directed by MD) 30 patch 2   montelukast (SINGULAIR) 10 MG tablet Take 1 tablet (10 mg total) by mouth at bedtime. 30 tablet 3   morphine (MS CONTIN) 30 MG 12 hr tablet Take 1 tablet (30 mg total) by mouth every 12 (twelve) hours for 7 days. Must last 30 days. 14 tablet 0   NARCAN 4 MG/0.1ML LIQD nasal spray kit Place 4 mg into the nose once as needed (overdose).   0   ondansetron (ZOFRAN) 4 MG tablet Take 4 mg by mouth every 8 (eight) hours as needed for nausea or vomiting.     oxyCODONE (ROXICODONE) 15 MG immediate release tablet Take 1 tablet (15 mg total) by mouth every 4 (four) hours as needed for up to 15 days for pain (moderate to severe pain). 15 tablet 0   pantoprazole (PROTONIX) 40 MG tablet Take 1 tablet (40 mg total) by mouth daily. 30 tablet 3   promethazine (PHENERGAN) 12.5 MG tablet Take 1 tablet (12.5 mg total) by mouth every 4 (four) hours as needed for nausea or vomiting. 30 tablet 0   tiZANidine (ZANAFLEX) 2 MG tablet TAKE 1 TABLET(2 MG) BY MOUTH EVERY 8 HOURS AS NEEDED FOR MUSCLE SPASMS (Patient taking differently: Take 2 mg by mouth every 8 (eight) hours as needed for muscle spasms. ) 45 tablet 2   albuterol (VENTOLIN HFA) 108 (90 Base) MCG/ACT inhaler Inhale 2 puffs into the lungs every 6 (six) hours as needed for wheezing or shortness of breath. (Patient not taking: Reported on 07/24/2020) 8 g 0   ergocalciferol (VITAMIN D2) 1.25 MG (50000 UT) capsule Take 1 capsule (50,000 Units total) by mouth once a week. (Patient not taking: Reported on 07/24/2020) 12 capsule 3   sucralfate (CARAFATE) 1 GM/10ML suspension Take 10 mLs (1 g total) by mouth 4 (four) times daily for 21 days. (Patient taking differently: Take 1 g by mouth 4 (four) times daily as needed. Pt takes as needed for stomach pain) 420 mL 1   No  facility-administered medications prior to visit.    Allergies  Allergen Reactions   Penicillins Swelling and Rash    Has patient had a PCN reaction causing immediate rash, facial/tongue/throat swelling, SOB or lightheadedness with hypotension: Yes Has patient had a PCN reaction causing severe rash involving mucus membranes  or skin necrosis: Yes Has patient had a PCN reaction that required hospitalization No Has patient had a PCN reaction occurring within the last 10 years: no If all of the above answers are "NO", then may proceed with Cephalosporin use.     ROS Review of Systems  All other systems reviewed and are negative.     Objective:    Physical Exam Constitutional:      General: She is not in acute distress.    Appearance: She is normal weight. She is not ill-appearing or toxic-appearing.  HENT:     Head: Normocephalic and atraumatic.     Nose: Nose normal.     Mouth/Throat:     Mouth: Mucous membranes are moist.  Cardiovascular:     Rate and Rhythm: Normal rate and regular rhythm.     Pulses: Normal pulses.     Heart sounds: Normal heart sounds.  Pulmonary:     Effort: Pulmonary effort is normal.     Breath sounds: Normal breath sounds.  Musculoskeletal:        General: Normal range of motion.     Cervical back: Normal range of motion.     Right lower leg: No edema.     Left lower leg: No edema.  Skin:    General: Skin is warm and dry.     Capillary Refill: Capillary refill takes less than 2 seconds.  Neurological:     General: No focal deficit present.     Mental Status: She is alert and oriented to person, place, and time.  Psychiatric:        Mood and Affect: Mood normal.        Behavior: Behavior normal.     BP 122/83 (BP Location: Left Arm, Patient Position: Sitting, Cuff Size: Normal)    Pulse 66    Temp 98.2 F (36.8 C)    Resp 16    Ht _0  (1.6 m)    Wt 143 lb 12.8 oz (65.2 kg)    LMP 06/25/2020 (Approximate)    SpO2 98%    BMI 25.47 kg/m    Wt Readings from Last 3 Encounters:  07/24/20 146 lb (66.2 kg)  07/24/20 143 lb 12.8 oz (65.2 kg)  07/16/20 152 lb 8.9 oz (69.2 kg)     Health Maintenance Due  Topic Date Due   COVID-19 Vaccine (1) Never done    There are no preventive care reminders to display for this patient.  Lab Results  Component Value Date   TSH 1.940 07/24/2020   Lab Results  Component Value Date   WBC 10.7 (H) 07/25/2020   HGB 11.9 (L) 07/25/2020   HCT 31.8 (L) 07/25/2020   MCV 72.4 (L) 07/25/2020   PLT 360 07/25/2020   Lab Results  Component Value Date   NA 138 07/25/2020   K 4.2 07/25/2020   CO2 23 07/25/2020   GLUCOSE 96 07/25/2020   BUN 7 07/25/2020   CREATININE 0.84 07/25/2020   BILITOT 1.0 07/25/2020   ALKPHOS 50 07/25/2020   AST 18 07/25/2020   ALT 11 07/25/2020   PROT 7.6 07/25/2020   ALBUMIN 4.6 07/25/2020   CALCIUM 9.2 07/25/2020   ANIONGAP 9 07/25/2020   No results found for: CHOL No results found for: HDL No results found for: LDLCALC No results found for: TRIG No results found for: CHOLHDL No results found for: HGBA1C    Assessment & Plan:   Problem List Items Addressed This Visit  Other   Chronic pain syndrome All narcotics are on hold until UDS is clear of any illegal substance because this is against the controlled substance contract.    Relevant Orders   [id number] 11+Oxyco+Alc+Crt-Bund (Completed)   Cocaine use    Other Visit Diagnoses    Sickle cell anemia with crisis ([place])    -  Primary Continue with supportive medications for pain. Encouraged hydration and pain triggers.    Relevant Orders   Urinalysis Dipstick (Completed)   Healthcare maintenance       Relevant Orders   TSH (Completed)      No orders of the defined types were placed in this encounter.   Follow-up: Return in about 4 weeks (around 08/21/2020).    Vevelyn Francois, NP

## 2020-07-25 ENCOUNTER — Encounter (HOSPITAL_COMMUNITY): Payer: Self-pay | Admitting: Emergency Medicine

## 2020-07-25 ENCOUNTER — Emergency Department (HOSPITAL_COMMUNITY)
Admission: EM | Admit: 2020-07-25 | Discharge: 2020-07-25 | Disposition: A | Discharge status: Home / Self Care | Payer: Medicaid Other | Attending: Emergency Medicine | Admitting: Emergency Medicine

## 2020-07-25 DIAGNOSIS — F1193 Opioid use, unspecified with withdrawal: Secondary | ICD-10-CM

## 2020-07-25 DIAGNOSIS — D5700 Hb-SS disease with crisis, unspecified: Secondary | ICD-10-CM

## 2020-07-25 LAB — CBC WITH DIFFERENTIAL/PLATELET
Abs Immature Granulocytes: 0.03 10*3/uL (ref 0.00–0.07)
Basophils Absolute: 0.1 10*3/uL (ref 0.0–0.1)
Basophils Relative: 1 %
Eosinophils Absolute: 0.3 10*3/uL (ref 0.0–0.5)
Eosinophils Relative: 3 %
HCT: 31.8 % — ABNORMAL LOW (ref 36.0–46.0)
Hemoglobin: 11.9 g/dL — ABNORMAL LOW (ref 12.0–15.0)
Immature Granulocytes: 0 %
Lymphocytes Relative: 38 %
Lymphs Abs: 4.1 10*3/uL — ABNORMAL HIGH (ref 0.7–4.0)
MCH: 27.1 pg (ref 26.0–34.0)
MCHC: 37.4 g/dL — ABNORMAL HIGH (ref 30.0–36.0)
MCV: 72.4 fL — ABNORMAL LOW (ref 80.0–100.0)
Monocytes Absolute: 0.9 10*3/uL (ref 0.1–1.0)
Monocytes Relative: 9 %
Neutro Abs: 5.3 10*3/uL (ref 1.7–7.7)
Neutrophils Relative %: 49 %
Platelets: 360 10*3/uL (ref 150–400)
RBC: 4.39 MIL/uL (ref 3.87–5.11)
RDW: 13.3 % (ref 11.5–15.5)
WBC: 10.7 10*3/uL — ABNORMAL HIGH (ref 4.0–10.5)
nRBC: 1.8 % — ABNORMAL HIGH (ref 0.0–0.2)

## 2020-07-25 LAB — COMPREHENSIVE METABOLIC PANEL
ALT: 11 U/L (ref 0–44)
AST: 18 U/L (ref 15–41)
Albumin: 4.6 g/dL (ref 3.5–5.0)
Alkaline Phosphatase: 50 U/L (ref 38–126)
Anion gap: 9 (ref 5–15)
BUN: 7 mg/dL (ref 6–20)
CO2: 23 mmol/L (ref 22–32)
Calcium: 9.2 mg/dL (ref 8.9–10.3)
Chloride: 106 mmol/L (ref 98–111)
Creatinine, Ser: 0.84 mg/dL (ref 0.44–1.00)
GFR calc Af Amer: >60 mL/min (ref >60)
GFR calc non Af Amer: >60 mL/min (ref >60)
Glucose, Bld: 96 mg/dL (ref 70–99)
Potassium: 4.2 mmol/L (ref 3.5–5.1)
Sodium: 138 mmol/L (ref 135–145)
Total Bilirubin: 1 mg/dL (ref 0.3–1.2)
Total Protein: 7.6 g/dL (ref 6.5–8.1)

## 2020-07-25 LAB — RETICULOCYTES
Immature Retic Fract: 28.9 % — ABNORMAL HIGH (ref 2.3–15.9)
RBC.: 4.34 MIL/uL (ref 3.87–5.11)
Retic Count, Absolute: 137.1 10*3/uL (ref 19.0–186.0)
Retic Ct Pct: 3.2 % — ABNORMAL HIGH (ref 0.4–3.1)

## 2020-07-25 LAB — LACTIC ACID, PLASMA: Lactic Acid, Venous: 0.9 mmol/L (ref 0.5–1.9)

## 2020-07-25 LAB — I-STAT BETA HCG BLOOD, ED (MC, WL, AP ONLY): I-stat hCG, quantitative: <5 m[IU]/mL (ref <5)

## 2020-07-25 LAB — TSH: TSH: 1.94 u[IU]/mL (ref 0.450–4.500)

## 2020-07-25 MED ORDER — HYDROMORPHONE HCL 2 MG/ML IJ SOLN
2.0000 mg | INTRAMUSCULAR | Status: AC
  Administered 2020-07-25: 2 mg via INTRAVENOUS
  Filled 2020-07-25: qty 1

## 2020-07-25 MED ORDER — KETOROLAC TROMETHAMINE 15 MG/ML IJ SOLN
15.0000 mg | INTRAMUSCULAR | Status: AC
  Administered 2020-07-25: 15 mg via INTRAVENOUS
  Filled 2020-07-25: qty 1

## 2020-07-25 MED ORDER — OXYCODONE HCL 5 MG PO TABS
15.0000 mg | ORAL_TABLET | Freq: Once | ORAL | Status: AC
  Administered 2020-07-25: 15 mg via ORAL
  Filled 2020-07-25: qty 3

## 2020-07-25 MED ORDER — ONDANSETRON HCL 4 MG/2ML IJ SOLN
4.0000 mg | INTRAMUSCULAR | Status: DC | PRN
  Administered 2020-07-25: 4 mg via INTRAVENOUS
  Filled 2020-07-25: qty 2

## 2020-07-25 MED ORDER — SODIUM CHLORIDE 0.45 % IV SOLN: INTRAVENOUS | Status: DC

## 2020-07-25 MED ORDER — DIPHENHYDRAMINE HCL 50 MG/ML IJ SOLN
25.0000 mg | Freq: Three times a day (TID) | INTRAMUSCULAR | Status: DC | PRN
  Administered 2020-07-25: 25 mg via INTRAVENOUS
  Filled 2020-07-25: qty 1

## 2020-07-25 NOTE — Discharge Instructions (Addendum)
Get your prescriptions at the pharmacy for your morphine and muscle relaxers and ibuprofen.  If you need more oxycodone you will need to discuss that with your prescribing doctor.  Drink plenty of fluids.

## 2020-07-25 NOTE — ED Triage Notes (Signed)
Patient awakened from sleep with generalized body pains Mainly in right leg  Patient tried going to sickle cell clinic but they weren't taking any more patients

## 2020-07-25 NOTE — ED Provider Notes (Signed)
[place] [facility] DEPT Provider Note   CSN: [id number] Arrival date & time: 07/24/20  2348   Time seen 2:03 AM  History Chief Complaint  Patient presents with  . Sickle Cell Pain Crisis    Hannah Hawkins is a 34 y.o. female.  HPI   Patient has a history of sickle cell disease.  She states her current symptoms started on August 5 all day however she was able to go to bed after taking some ibuprofen but she woke up at 10 PM hurting again.  She states she hurts all over.  She denies fever, cough, chest pain, sore throat, rhinorrhea, nausea, vomiting, or diarrhea.  She states she has had a blood transfusion in the past however the last time was 13 years ago.  She has not taken the Covid vaccine.  PCP Vevelyn Francois, NP   Past Medical History:  Diagnosis Date  . Sickle cell anemia [facility])     Patient Active Problem List   Diagnosis Date Noted  . Non-intractable vomiting   . Joint pain in fingers of both hands 06/11/2020  . Hypokalemia 07/17/2019  . GERD (gastroesophageal reflux disease) 05/29/2019  . History of cocaine use 05/18/2019  . Chronic pain syndrome   . Cocaine use   . Sickle cell pain crisis ([place]) 11/14/2017  . Anemia of chronic disease 11/13/2017  . Sinusitis, chronic 11/13/2017  . 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 EYE SURGERY  03/09/2017   left eye  . EYE SURGERY Right 09/2019   right hemerage      OB History    Gravida  2   Para      Term      Preterm      AB      Living  1     SAB      TAB      Ectopic      Multiple      Live Births              Family History  Problem Relation Age of Onset  . Diabetes Father     Social History   Tobacco Use  . Smoking status: Former Smoker    Types: Cigarettes  . Smokeless tobacco: Never Used  Vaping Use  . Vaping Use: Never used  Substance Use Topics  . Alcohol use: Not Currently  . Drug use:  No    Home Medications Prior to Admission medications   Medication Sig Start Date End Date Taking? Authorizing Provider  albuterol (VENTOLIN HFA) 108 (90 Base) MCG/ACT inhaler Inhale 2 puffs into the lungs every 6 (six) hours as needed for wheezing or shortness of breath. Patient not taking: Reported on 07/24/2020 05/02/20   Vevelyn Francois, NP  citalopram (CELEXA) 10 MG tablet TAKE 1 TABLET(10 MG) BY MOUTH DAILY FOR 14 DAYS THEN TAKE 2 TABLETS(20 MG) BY MOUTH DAILY Patient taking differently: Take 20 mg by mouth daily.  06/09/20   Vevelyn Francois, NP  ergocalciferol (VITAMIN D2) 1.25 MG (50000 UT) capsule Take 1 capsule (50,000 Units total) by mouth once a week. Patient not taking: Reported on 07/24/2020 05/10/19   Lanae Boast, FNP  ibuprofen (ADVIL) 800 MG tablet Take 1 tablet (800 mg total) by mouth every 8 (eight) hours as needed for moderate pain. Take on full stomach. 05/28/20 08/26/20  Vevelyn Francois, NP  levocetirizine (XYZAL) 5 MG tablet Take 5 mg  by mouth every evening.    [provider]  lidocaine (LIDODERM) 5 % Place 1 patch onto the skin daily. Remove & Discard patch within 12 hours or as directed by MD Patient taking differently: Place 1 patch onto the skin every 12 (twelve) hours as needed (pain). Remove & Discard patch within 12 hours or as directed by MD 05/16/20   Vevelyn Francois, NP  montelukast (SINGULAIR) 10 MG tablet Take 1 tablet (10 mg total) by mouth at bedtime. 05/02/20   Vevelyn Francois, NP  NARCAN 4 MG/0.1ML LIQD nasal spray kit Place 4 mg into the nose once as needed (overdose).  12/22/17   [provider]  ondansetron (ZOFRAN) 4 MG tablet Take 4 mg by mouth every 8 (eight) hours as needed for nausea or vomiting.    [provider]  oxyCODONE (ROXICODONE) 15 MG immediate release tablet Take 1 tablet (15 mg total) by mouth every 4 (four) hours as needed for up to 15 days for pain (moderate to severe pain). 07/17/20 08/01/20  Dorena Dew, FNP    pantoprazole (PROTONIX) 40 MG tablet Take 1 tablet (40 mg total) by mouth daily. 05/10/19   Lanae Boast, FNP  promethazine (PHENERGAN) 12.5 MG tablet Take 1 tablet (12.5 mg total) by mouth every 4 (four) hours as needed for nausea or vomiting. 02/25/20   Vevelyn Francois, NP  sucralfate (CARAFATE) 1 GM/10ML suspension Take 10 mLs (1 g total) by mouth 4 (four) times daily for 21 days. Patient taking differently: Take 1 g by mouth 4 (four) times daily as needed. Pt takes as needed for stomach pain 05/16/20 07/13/20  Tresa Garter, MD  tiZANidine (ZANAFLEX) 2 MG tablet TAKE 1 TABLET(2 MG) BY MOUTH EVERY 8 HOURS AS NEEDED FOR MUSCLE SPASMS Patient taking differently: Take 2 mg by mouth every 8 (eight) hours as needed for muscle spasms.  07/10/20   Vevelyn Francois, NP    Allergies    Penicillins  Review of Systems   Review of Systems  All other systems reviewed and are negative.   Physical Exam Updated Vital Signs BP 117/78 (BP Location: Left Arm)   Pulse 73   Temp 98.2 F (36.8 C) (Oral)   Resp (!) 22   Ht _0  (1.6 m)   Wt 66.2 kg   LMP 06/25/2020 (Approximate)   SpO2 99%   BMI 25.86 kg/m   Physical Exam Vitals and nursing note reviewed.  Constitutional:      Appearance: Normal appearance. She is normal weight.  HENT:     Head: Normocephalic and atraumatic.     Right Ear: External ear normal.     Left Ear: External ear normal.     Nose: Nose normal.     Mouth/Throat:     Mouth: Mucous membranes are moist.     Pharynx: No oropharyngeal exudate or posterior oropharyngeal erythema.  Eyes:     General: No scleral icterus.    Extraocular Movements: Extraocular movements intact.     Conjunctiva/sclera: Conjunctivae normal.     Pupils: Pupils are equal, round, and reactive to light.  Cardiovascular:     Rate and Rhythm: Regular rhythm. Bradycardia present.     Pulses: Normal pulses.     Heart sounds: Normal heart sounds.  Pulmonary:     Effort: Pulmonary effort is  normal.     Breath sounds: Normal breath sounds.  Abdominal:     General: Abdomen is flat. Bowel sounds are normal.  Palpations: Abdomen is soft.  Musculoskeletal:        General: No swelling or deformity. Normal range of motion.     Cervical back: Normal range of motion.  Skin:    General: Skin is warm and dry.  Neurological:     General: No focal deficit present.     Mental Status: She is alert and oriented to person, place, and time.     Cranial Nerves: No cranial nerve deficit.  Psychiatric:        Mood and Affect: Mood normal.        Behavior: Behavior normal.        Thought Content: Thought content normal.     ED Results / Procedures / Treatments   Labs (all labs ordered are listed, but only abnormal results are displayed) Results for orders placed or performed during the hospital encounter of 07/25/20  CBC WITH DIFFERENTIAL  Result Value Ref Range   WBC 10.7 (H) 4.0 - 10.5 K/uL   RBC 4.39 3.87 - 5.11 MIL/uL   Hemoglobin 11.9 (L) 12.0 - 15.0 g/dL   HCT 31.8 (L) 36 - 46 %   MCV 72.4 (L) 80.0 - 100.0 fL   MCH 27.1 26.0 - 34.0 pg   MCHC 37.4 (H) 30.0 - 36.0 g/dL   RDW 13.3 11.5 - 15.5 %   Platelets 360 150 - 400 K/uL   nRBC 1.8 (H) 0.0 - 0.2 %   Neutrophils Relative % 49 %   Neutro Abs 5.3 1.7 - 7.7 K/uL   Lymphocytes Relative 38 %   Lymphs Abs 4.1 (H) 0.7 - 4.0 K/uL   Monocytes Relative 9 %   Monocytes Absolute 0.9 0 - 1 K/uL   Eosinophils Relative 3 %   Eosinophils Absolute 0.3 0 - 0 K/uL   Basophils Relative 1 %   Basophils Absolute 0.1 0 - 0 K/uL   Immature Granulocytes 0 %   Abs Immature Granulocytes 0.03 0.00 - 0.07 K/uL  Lactic acid, plasma  Result Value Ref Range   Lactic Acid, Venous 0.9 0.5 - 1.9 mmol/L  Reticulocytes  Result Value Ref Range   Retic Ct Pct 3.2 (H) 0.4 - 3.1 %   RBC. 4.34 3.87 - 5.11 MIL/uL   Retic Count, Absolute 137.1 19.0 - 186.0 K/uL   Immature Retic Fract 28.9 (H) 2.3 - 15.9 %  Comprehensive metabolic panel  Result Value  Ref Range   Sodium 138 135 - 145 mmol/L   Potassium 4.2 3.5 - 5.1 mmol/L   Chloride 106 98 - 111 mmol/L   CO2 23 22 - 32 mmol/L   Glucose, Bld 96 70 - 99 mg/dL   BUN 7 6 - 20 mg/dL   Creatinine, Ser 0.84 0.44 - 1.00 mg/dL   Calcium 9.2 8.9 - 10.3 mg/dL   Total Protein 7.6 6.5 - 8.1 g/dL   Albumin 4.6 3.5 - 5.0 g/dL   AST 18 15 - 41 U/L   ALT 11 0 - 44 U/L   Alkaline Phosphatase 50 38 - 126 U/L   Total Bilirubin 1.0 0.3 - 1.2 mg/dL   GFR calc non Af Amer >60 >60 mL/min   GFR calc Af Amer >60 >60 mL/min   Anion gap 9 5 - 15  I-Stat beta hCG blood, ED  Result Value Ref Range   I-stat hCG, quantitative <5.0 <5 mIU/mL   Comment 3           Laboratory interpretation all normal except very minimal  stable anemia, no elevation of her LFTs to suggest hemolysis.    EKG None  Radiology No results found.  Procedures Procedures (including critical care time)  Medications Ordered in ED Medications  0.45 % sodium chloride infusion ( Intravenous New Bag/Given 07/25/20 0235)  diphenhydrAMINE (BENADRYL) injection 25 mg (25 mg Intravenous Given 07/25/20 0302)  ondansetron (ZOFRAN) injection 4 mg (4 mg Intravenous Given 07/25/20 0306)  oxyCODONE (Oxy IR/ROXICODONE) immediate release tablet 15 mg (has no administration in time range)  HYDROmorphone (DILAUDID) injection 2 mg (2 mg Intravenous Given 07/25/20 0308)  HYDROmorphone (DILAUDID) injection 2 mg (2 mg Intravenous Given 07/25/20 0336)  ketorolac (TORADOL) 15 MG/ML injection 15 mg (15 mg Intravenous Given 07/25/20 0259)    ED Course  I have reviewed the triage vital signs and the nursing notes.  Pertinent labs & imaging results that were available during my care of the patient were reviewed by me and considered in my medical decision making (see chart for details).    MDM Rules/Calculators/A&P                          Patient was started on sickle cell crisis protocol.  When I review patient's [place] she gets #60  oxycodone 15 mg monthly, last filled June 29 and she got 15 tablets on July 29.  She also is on #60 morphine sulfate ER 30 mg tablets last filled July 7.  Recheck at 5:45 AM patient is scratching when I enter the room.  She does not appear to be in distress otherwise.  She states she feels like she needs "1 more dose".  I explained to her that we only do 2.  If we need to do 3 joint needs to me she need to be admitted.  This point she relates she has run out of her pain medicine.  She was given 15 of the oxycodone when she was discharged from the hospital on July 29.  She did see her doctor yesterday and is waiting for the results of a urine test but she states she can get her morphine and muscle relaxer and ibuprofen filled today.  The only thing she cannot get is her oxycodone.  I told her I had be glad to give her 1 dose of the oxycodone I do not feel that she needs to be admitted to the hospital.  She then starts trying to bargain with me whether I am go to give her the short acting or the long-acting.  I gave her the short acting which is what she normally is taking.  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

## 2020-07-28 ENCOUNTER — Other Ambulatory Visit: Payer: Self-pay

## 2020-07-28 DIAGNOSIS — G894 Chronic pain syndrome: Secondary | ICD-10-CM

## 2020-07-28 DIAGNOSIS — D5720 Sickle-cell/Hb-C disease without crisis: Secondary | ICD-10-CM

## 2020-07-29 NOTE — Telephone Encounter (Signed)
Refill request for oxycodone. Please advise.  

## 2020-07-30 ENCOUNTER — Other Ambulatory Visit: Payer: Self-pay | Admitting: Family Medicine

## 2020-07-30 ENCOUNTER — Telehealth: Payer: Self-pay

## 2020-07-30 DIAGNOSIS — G894 Chronic pain syndrome: Secondary | ICD-10-CM

## 2020-07-30 DIAGNOSIS — D5720 Sickle-cell/Hb-C disease without crisis: Secondary | ICD-10-CM

## 2020-07-30 MED ORDER — OXYCODONE HCL 15 MG PO TABS: 15.0000 mg | ORAL_TABLET | ORAL | 0 refills | Status: DC | PRN

## 2020-07-30 NOTE — Telephone Encounter (Signed)
Hannah Hawkins can you please advise. Pt usually sees crystal.

## 2020-07-30 NOTE — Telephone Encounter (Signed)
Patient is asking for a refill on oxycodone short acting. Please advise.

## 2020-08-02 LAB — OXYCODONE/OXYMORPHONE, CONFIRM
OXYCODONE/OXYMORPH: POSITIVE — AB
OXYCODONE: 2054 ng/mL
OXYCODONE: POSITIVE — AB
OXYMORPHONE (GC/MS): >3000 ng/mL
OXYMORPHONE: POSITIVE — AB

## 2020-08-02 LAB — DRUG SCREEN 764883 11+OXYCO+ALC+CRT-BUND
Amphetamines, Urine: NEGATIVE ng/mL
BENZODIAZ UR QL: NEGATIVE ng/mL
Barbiturate: NEGATIVE ng/mL
Cannabinoid Quant, Ur: NEGATIVE ng/mL
Cocaine (Metabolite): NEGATIVE ng/mL
Creatinine: 169.9 mg/dL (ref 20.0–300.0)
Ethanol: NEGATIVE %
Meperidine: NEGATIVE ng/mL
Methadone Screen, Urine: NEGATIVE ng/mL
Phencyclidine: NEGATIVE ng/mL
Propoxyphene: NEGATIVE ng/mL
Tramadol: NEGATIVE ng/mL
pH, Urine: 5.6 (ref 4.5–8.9)

## 2020-08-02 LAB — OPIATES CONFIRMATION, URINE
Codeine: NEGATIVE
Hydrocodone: NEGATIVE
Hydromorphone: NEGATIVE
Morphine Confirm: 657 ng/mL
Morphine: POSITIVE — AB
Opiates: POSITIVE ng/mL — AB

## 2020-08-06 ENCOUNTER — Ambulatory Visit: Payer: Medicaid Other | Admitting: Nurse Practitioner

## 2020-08-06 ENCOUNTER — Emergency Department (HOSPITAL_COMMUNITY): Payer: Medicaid Other

## 2020-08-06 ENCOUNTER — Inpatient Hospital Stay (HOSPITAL_COMMUNITY)
Admission: EM | Admit: 2020-08-06 | Discharge: 2020-08-10 | DRG: 812 | Disposition: A | Discharge status: Home / Self Care | Payer: Medicaid Other | Attending: Internal Medicine | Admitting: Internal Medicine

## 2020-08-06 ENCOUNTER — Inpatient Hospital Stay (HOSPITAL_COMMUNITY): Payer: Medicaid Other

## 2020-08-06 ENCOUNTER — Other Ambulatory Visit: Payer: Self-pay

## 2020-08-06 DIAGNOSIS — F1120 Opioid dependence, uncomplicated: Secondary | ICD-10-CM | POA: Diagnosis present

## 2020-08-06 DIAGNOSIS — Z87898 Personal history of other specified conditions: Secondary | ICD-10-CM

## 2020-08-06 DIAGNOSIS — D5700 Hb-SS disease with crisis, unspecified: Principal | ICD-10-CM | POA: Diagnosis present

## 2020-08-06 DIAGNOSIS — R7989 Other specified abnormal findings of blood chemistry: Secondary | ICD-10-CM | POA: Diagnosis not present

## 2020-08-06 DIAGNOSIS — E872 Acidosis, unspecified: Secondary | ICD-10-CM

## 2020-08-06 DIAGNOSIS — D72829 Elevated white blood cell count, unspecified: Secondary | ICD-10-CM | POA: Diagnosis present

## 2020-08-06 DIAGNOSIS — R1013 Epigastric pain: Secondary | ICD-10-CM | POA: Diagnosis not present

## 2020-08-06 DIAGNOSIS — Z20822 Contact with and (suspected) exposure to covid-19: Secondary | ICD-10-CM | POA: Diagnosis present

## 2020-08-06 DIAGNOSIS — R1011 Right upper quadrant pain: Secondary | ICD-10-CM | POA: Diagnosis not present

## 2020-08-06 DIAGNOSIS — Z87891 Personal history of nicotine dependence: Secondary | ICD-10-CM

## 2020-08-06 DIAGNOSIS — G894 Chronic pain syndrome: Secondary | ICD-10-CM | POA: Diagnosis present

## 2020-08-06 DIAGNOSIS — R1110 Vomiting, unspecified: Secondary | ICD-10-CM | POA: Diagnosis not present

## 2020-08-06 DIAGNOSIS — Z880 Allergy status to penicillin: Secondary | ICD-10-CM

## 2020-08-06 DIAGNOSIS — F1491 Cocaine use, unspecified, in remission: Secondary | ICD-10-CM

## 2020-08-06 DIAGNOSIS — R6510 Systemic inflammatory response syndrome (SIRS) of non-infectious origin without acute organ dysfunction: Secondary | ICD-10-CM | POA: Diagnosis present

## 2020-08-06 DIAGNOSIS — M6282 Rhabdomyolysis: Secondary | ICD-10-CM | POA: Diagnosis present

## 2020-08-06 DIAGNOSIS — Z833 Family history of diabetes mellitus: Secondary | ICD-10-CM | POA: Diagnosis not present

## 2020-08-06 DIAGNOSIS — D734 Cyst of spleen: Secondary | ICD-10-CM | POA: Diagnosis present

## 2020-08-06 DIAGNOSIS — E860 Dehydration: Secondary | ICD-10-CM | POA: Diagnosis present

## 2020-08-06 LAB — SARS CORONAVIRUS 2 BY RT PCR (HOSPITAL ORDER, PERFORMED IN ~~LOC~~ HOSPITAL LAB): SARS Coronavirus 2: NEGATIVE

## 2020-08-06 LAB — COMPREHENSIVE METABOLIC PANEL
ALT: 100 U/L — ABNORMAL HIGH (ref 0–44)
AST: 198 U/L — ABNORMAL HIGH (ref 15–41)
Albumin: 4.9 g/dL (ref 3.5–5.0)
Alkaline Phosphatase: 58 U/L (ref 38–126)
Anion gap: 12 (ref 5–15)
BUN: 15 mg/dL (ref 6–20)
CO2: 23 mmol/L (ref 22–32)
Calcium: 8.7 mg/dL — ABNORMAL LOW (ref 8.9–10.3)
Chloride: 105 mmol/L (ref 98–111)
Creatinine, Ser: 1 mg/dL (ref 0.44–1.00)
GFR calc Af Amer: >60 mL/min (ref >60)
GFR calc non Af Amer: >60 mL/min (ref >60)
Glucose, Bld: 108 mg/dL — ABNORMAL HIGH (ref 70–99)
Potassium: 3.9 mmol/L (ref 3.5–5.1)
Sodium: 140 mmol/L (ref 135–145)
Total Bilirubin: 1.6 mg/dL — ABNORMAL HIGH (ref 0.3–1.2)
Total Protein: 8 g/dL (ref 6.5–8.1)

## 2020-08-06 LAB — CBC WITH DIFFERENTIAL/PLATELET
Abs Immature Granulocytes: 0.38 10*3/uL — ABNORMAL HIGH (ref 0.00–0.07)
Basophils Absolute: 0.1 10*3/uL (ref 0.0–0.1)
Basophils Relative: 0 %
Eosinophils Absolute: 0 10*3/uL (ref 0.0–0.5)
Eosinophils Relative: 0 %
HCT: 31.4 % — ABNORMAL LOW (ref 36.0–46.0)
Hemoglobin: 11.7 g/dL — ABNORMAL LOW (ref 12.0–15.0)
Immature Granulocytes: 1 %
Lymphocytes Relative: 2 %
Lymphs Abs: 0.8 10*3/uL (ref 0.7–4.0)
MCH: 27 pg (ref 26.0–34.0)
MCHC: 37.3 g/dL — ABNORMAL HIGH (ref 30.0–36.0)
MCV: 72.5 fL — ABNORMAL LOW (ref 80.0–100.0)
Monocytes Absolute: 1.3 10*3/uL — ABNORMAL HIGH (ref 0.1–1.0)
Monocytes Relative: 3 %
Neutro Abs: 36.9 10*3/uL — ABNORMAL HIGH (ref 1.7–7.7)
Neutrophils Relative %: 94 %
Platelets: 283 10*3/uL (ref 150–400)
RBC: 4.33 MIL/uL (ref 3.87–5.11)
RDW: 14.9 % (ref 11.5–15.5)
WBC: 39.4 10*3/uL — ABNORMAL HIGH (ref 4.0–10.5)
nRBC: 0.8 % — ABNORMAL HIGH (ref 0.0–0.2)

## 2020-08-06 LAB — LACTIC ACID, PLASMA
Lactic Acid, Venous: 3.2 mmol/L (ref 0.5–1.9)
Lactic Acid, Venous: 4.1 mmol/L (ref 0.5–1.9)
Lactic Acid, Venous: 2.7 mmol/L (ref 0.5–1.9)

## 2020-08-06 LAB — PROTIME-INR
INR: 1.2 (ref 0.8–1.2)
Prothrombin Time: 14.8 seconds (ref 11.4–15.2)

## 2020-08-06 LAB — CK: Total CK: 7103 U/L — ABNORMAL HIGH (ref 38–234)

## 2020-08-06 LAB — RETICULOCYTES
Immature Retic Fract: 13.6 % (ref 2.3–15.9)
RBC.: 4.29 MIL/uL (ref 3.87–5.11)
Retic Count, Absolute: 189.5 10*3/uL — ABNORMAL HIGH (ref 19.0–186.0)
Retic Ct Pct: 4.6 % — ABNORMAL HIGH (ref 0.4–3.1)

## 2020-08-06 LAB — PROCALCITONIN: Procalcitonin: 2.48 ng/mL

## 2020-08-06 LAB — MAGNESIUM: Magnesium: 1.9 mg/dL (ref 1.7–2.4)

## 2020-08-06 LAB — I-STAT BETA HCG BLOOD, ED (MC, WL, AP ONLY): I-stat hCG, quantitative: <5 m[IU]/mL (ref <5)

## 2020-08-06 LAB — LIPASE, BLOOD: Lipase: 87 U/L — ABNORMAL HIGH (ref 11–51)

## 2020-08-06 LAB — FIBRINOGEN: Fibrinogen: 278 mg/dL (ref 210–475)

## 2020-08-06 MED ORDER — LACTATED RINGERS IV BOLUS
1000.0000 mL | Freq: Once | INTRAVENOUS | Status: AC
  Administered 2020-08-06: 1000 mL via INTRAVENOUS

## 2020-08-06 MED ORDER — ONDANSETRON HCL 4 MG/2ML IJ SOLN
4.0000 mg | Freq: Once | INTRAMUSCULAR | Status: AC
  Administered 2020-08-06: 4 mg via INTRAVENOUS
  Filled 2020-08-06: qty 2

## 2020-08-06 MED ORDER — IOHEXOL 300 MG/ML  SOLN
100.0000 mL | Freq: Once | INTRAMUSCULAR | Status: AC | PRN
  Administered 2020-08-06: 100 mL via INTRAVENOUS

## 2020-08-06 MED ORDER — SODIUM CHLORIDE (PF) 0.9 % IJ SOLN
INTRAMUSCULAR | Status: AC
  Filled 2020-08-06: qty 50

## 2020-08-06 MED ORDER — VANCOMYCIN HCL 750 MG/150ML IV SOLN
750.0000 mg | Freq: Two times a day (BID) | INTRAVENOUS | Status: DC
  Administered 2020-08-07 – 2020-08-08 (×3): 750 mg via INTRAVENOUS
  Filled 2020-08-06 (×3): qty 150

## 2020-08-06 MED ORDER — DIPHENHYDRAMINE HCL 50 MG/ML IJ SOLN
25.0000 mg | Freq: Once | INTRAMUSCULAR | Status: AC
  Administered 2020-08-06: 25 mg via INTRAVENOUS
  Filled 2020-08-06: qty 1

## 2020-08-06 MED ORDER — HYDROMORPHONE HCL 2 MG/ML IJ SOLN
2.0000 mg | INTRAMUSCULAR | Status: AC
  Administered 2020-08-06: 2 mg via INTRAVENOUS
  Filled 2020-08-06: qty 1

## 2020-08-06 MED ORDER — SODIUM CHLORIDE 0.9 % IV SOLN
2.0000 g | Freq: Once | INTRAVENOUS | Status: AC
  Administered 2020-08-06: 2 g via INTRAVENOUS
  Filled 2020-08-06: qty 2

## 2020-08-06 MED ORDER — SODIUM CHLORIDE 0.9 % IV SOLN
2.0000 g | Freq: Three times a day (TID) | INTRAVENOUS | Status: DC
  Administered 2020-08-07 – 2020-08-08 (×5): 2 g via INTRAVENOUS
  Filled 2020-08-06 (×5): qty 2

## 2020-08-06 MED ORDER — VANCOMYCIN HCL IN DEXTROSE 1-5 GM/200ML-% IV SOLN
1000.0000 mg | Freq: Once | INTRAVENOUS | Status: AC
  Administered 2020-08-06: 1000 mg via INTRAVENOUS
  Filled 2020-08-06: qty 200

## 2020-08-06 MED ORDER — SODIUM CHLORIDE 0.45 % IV SOLN: INTRAVENOUS | Status: DC

## 2020-08-06 MED ORDER — ONDANSETRON HCL 4 MG/2ML IJ SOLN
4.0000 mg | INTRAMUSCULAR | Status: DC | PRN
  Filled 2020-08-06 (×4): qty 2

## 2020-08-06 MED ORDER — HYDROMORPHONE HCL 1 MG/ML IJ SOLN
1.0000 mg | INTRAMUSCULAR | Status: DC | PRN
  Filled 2020-08-06 (×2): qty 1

## 2020-08-06 MED ORDER — METRONIDAZOLE IN NACL 5-0.79 MG/ML-% IV SOLN
500.0000 mg | Freq: Three times a day (TID) | INTRAVENOUS | Status: DC
  Administered 2020-08-06 – 2020-08-08 (×6): 500 mg via INTRAVENOUS
  Filled 2020-08-06 (×6): qty 100

## 2020-08-06 NOTE — ED Triage Notes (Addendum)
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 EMS gave 500 cc NS, 4mg  Zofran, 100 mcg Fentanyl IV.   20 G R Hand

## 2020-08-06 NOTE — Progress Notes (Signed)
Pharmacy Antibiotic Note  Hannah Hawkins is a 34 y.o. female with sickle cell anemia presented to the ED on 08/06/2020 with c/o n/v and generalized pain. Lactic acid was found to be elevated in the ED.  Pharmacy is consulted to dose vancomycin and cefepime for broad empiric coverage.   Plan: - vancomycin 1000 mg IV x1 ordered, then 750 mg IV q12h for goal trough 15-20 - cefepime 2gm IV q8h - flagyl 500 mg IV q8h per MD ________________________________  Height: 5\' 3"  (160 cm) Weight: 66.2 kg (146 lb) IBW/kg (Calculated) : 52.4  Temp (24hrs), Avg:97.9 F (36.6 C), Min:97.9 F (36.6 C), Max:97.9 F (36.6 C)  Recent Labs  Lab 08/06/20 1247 08/06/20 1640 08/06/20 1733  WBC 39.4*  --   --   CREATININE 1.00  --   --   LATICACIDVEN  --  3.2* 4.1*    Estimated Creatinine Clearance: 71.1 mL/min (by C-G formula based on SCr of 1 mg/dL).    Allergies  Allergen Reactions  . Penicillins Swelling and Rash    Has patient had a PCN reaction causing immediate rash, facial/tongue/throat swelling, SOB or lightheadedness with hypotension: Yes Has patient had a PCN reaction causing severe rash involving mucus membranes or skin necrosis: Yes Has patient had a PCN reaction that required hospitalization No Has patient had a PCN reaction occurring within the last 10 years: no If all of the above answers are "NO", then may proceed with Cephalosporin use.      Thank you for allowing pharmacy to be a part of this patient's care.  08/08/20 08/06/2020 8:09 PM

## 2020-08-06 NOTE — ED Notes (Signed)
Date and time results received: 08/06/20 5:27 PM  (use smartphrase ".now" to insert current time)  Test: Lactic Critical Value: 3.2  Name of Provider Notified: [place]  Orders Received? Or Actions Taken?: Orders Received - See Orders for details

## 2020-08-06 NOTE — H&P (Addendum)
Hannah Hawkins NSQ:[id number] DOB: 06-01-1986 DOA: 08/06/2020     PCP: Vevelyn Francois, NP   Outpatient Specialists:    NONE  Patient arrived to ER on 08/06/20 at 0941 Referred by Attending Gareth Morgan, MD   Patient coming from: home Lives  With family    Chief Complaint: Sickle cell pain   HPI: Hannah Hawkins is a 34 y.o. female with medical history significant of sickle cell disease, cocaine abuse,    Presented with abdominal pain nausea vomiting chills stuffy nose and generalized body aches. She reports her pain similar to her sickle cell pain crisis. No chest pain or shortness of breath no leg swelling  Infectious risk factors:  Reports  URI symptoms, N/V/Diarrhea/abdominal pain,  Body aches, severe fatigue    Has  NOt been vaccinated against COVID    Initial COVID TEST   in house  PCR testing  Negative  Lab Results  Component Value Date   SARSCOV2NAA NEGATIVE 08/06/2020   [place] NEGATIVE 07/13/2020   [place] NEGATIVE 06/11/2020   [place] NEGATIVE 05/14/2020    Regarding pertinent Chronic problems:       Chronic anemia - baseline hg Hemoglobin & Hematocrit  Recent Labs    07/17/20 0547 07/25/20 0221 08/06/20 1247  HGB 11.2* 11.9* 11.7*     While in ER: Noted to have lactic acid elevated up to 4 CT unremarkable Except for spleen cysts And white blood cell count elevated in the 30s. Started on vancomycin cefepime and Flagyl by ER provider Also noted elevated LFTs Procalcitonin elevated 2.48 Blood cultures were obtained Right upper quadrant ultrasound unremarkable  Hospitalist was called for admission for sickle cell pain crisis  The following Work up has been ordered so far:  Orders Placed This Encounter  Procedures  . Blood culture (routine x 2)  . DG Chest 1 View  . CT ABDOMEN PELVIS W CONTRAST  . US Abdomen Limited RUQ  . Comprehensive metabolic panel  . CBC with Differential  . Reticulocytes  . Lactic acid,  plasma  . Lipase, blood  . Vital signs with O2 sat, q1hour  . Cardiac monitoring  . Monitor O2 SATs  . Weigh patient in Kg  . Saline Lock IV-Maintain IV access  . Patient may eat/drink  . Initiate Carrier Fluid Protocol  . Consult to hospitalist  ALL PATIENTS BEING ADMITTED/HAVING PROCEDURES NEED COVID-19 SCREENING  . I-Stat beta hCG blood, ED    Following Medications were ordered in ER: Medications  sodium chloride (PF) 0.9 % injection (has no administration in time range)  HYDROmorphone (DILAUDID) injection 2 mg (has no administration in time range)  ondansetron (ZOFRAN) injection 4 mg (has no administration in time range)  0.45 % sodium chloride infusion (has no administration in time range)  lactated ringers bolus 1,000 mL (has no administration in time range)  metroNIDAZOLE (FLAGYL) IVPB 500 mg (500 mg Intravenous New Bag/Given 08/06/20 1855)  vancomycin (VANCOCIN) IVPB 1000 mg/200 mL premix (has no administration in time range)  ceFEPIme (MAXIPIME) 2 g in sodium chloride 0.9 % 100 mL IVPB (has no administration in time range)  ondansetron (ZOFRAN) injection 4 mg (4 mg Intravenous Given 08/06/20 1400)  iohexol (OMNIPAQUE) 300 MG/ML solution 100 mL (100 mLs Intravenous Contrast Given 08/06/20 1652)  HYDROmorphone (DILAUDID) injection 2 mg (2 mg Intravenous Given 08/06/20 1809)  diphenhydrAMINE (BENADRYL) injection 25 mg (25 mg Intravenous Given 08/06/20 1808)  lactated ringers bolus 1,000 mL (1,000 mLs Intravenous Bolus from Bag 08/06/20 1808)  Consult Orders  (From admission, onward)         Start     Ordered   08/06/20 1904  Consult to hospitalist  ALL PATIENTS BEING ADMITTED/HAVING PROCEDURES NEED COVID-19 SCREENING  Once       Comments: ALL PATIENTS BEING ADMITTED/HAVING PROCEDURES NEED COVID-19 SCREENING  Provider:  (Not yet assigned)  Question Answer Comment  Place call to: [facility]   Reason for Consult Admit      08/06/20 1903            Significant initial  Findings: Abnormal Labs Reviewed  COMPREHENSIVE METABOLIC PANEL - Abnormal; Notable for the following components:      Result Value   Glucose, Bld 108 (*)    Calcium 8.7 (*)    AST 198 (*)    ALT 100 (*)    Total Bilirubin 1.6 (*)    All other components within normal limits  CBC WITH DIFFERENTIAL/PLATELET - Abnormal; Notable for the following components:   WBC 39.4 (*)    Hemoglobin 11.7 (*)    HCT 31.4 (*)    MCV 72.5 (*)    MCHC 37.3 (*)    nRBC 0.8 (*)    Neutro Abs 36.9 (*)    Monocytes Absolute 1.3 (*)    Abs Immature Granulocytes 0.38 (*)    All other components within normal limits  RETICULOCYTES - Abnormal; Notable for the following components:   Retic Ct Pct 4.6 (*)    Retic Count, Absolute 189.5 (*)    All other components within normal limits  LACTIC ACID, PLASMA - Abnormal; Notable for the following components:   Lactic Acid, Venous 3.2 (*)    All other components within normal limits  LACTIC ACID, PLASMA - Abnormal; Notable for the following components:   Lactic Acid, Venous 4.1 (*)    All other components within normal limits  LIPASE, BLOOD - Abnormal; Notable for the following components:   Lipase 87 (*)    All other components within normal limits  LACTIC ACID, PLASMA - Abnormal; Notable for the following components:   Lactic Acid, Venous 2.7 (*)    All other components within normal limits    Otherwise labs showing:    Recent Labs  Lab 08/06/20 1247  NA 140  K 3.9  CO2 23  GLUCOSE 108*  BUN 15  CREATININE 1.00  CALCIUM 8.7*    Cr   Up from baseline see below Lab Results  Component Value Date   CREATININE 1.00 08/06/2020   CREATININE 0.84 07/25/2020   CREATININE 0.62 07/16/2020    Recent Labs  Lab 08/06/20 1247  AST 198*  ALT 100*  ALKPHOS 58  BILITOT 1.6*  PROT 8.0  ALBUMIN 4.9   Lab Results  Component Value Date   CALCIUM 8.7 (L) 08/06/2020   PHOS 3.7 05/29/2019      WBC      Component Value  Date/Time   WBC 39.4 (H) 08/06/2020 1247   ANC    Component Value Date/Time   NEUTROABS 36.9 (H) 08/06/2020 1247   NEUTROABS 4.7 03/07/2020 1532   ALC No components found for: LYMPHAB    Plt: Lab Results  Component Value Date   PLT 283 08/06/2020    Lactic Acid, Venous    Component Value Date/Time   LATICACIDVEN 2.7 (HH) 08/06/2020 2030     Procalcitonin 2.38   COVID-19 Labs  No results for input(s): DDIMER, FERRITIN, LDH, CRP in the last 72 hours.  Lab  Results  Component Value Date   SARSCOV2NAA NEGATIVE 08/06/2020   [place] NEGATIVE 07/13/2020   [place] NEGATIVE 06/11/2020   [place] NEGATIVE 05/14/2020     HG/HCT  Up from baseline see below    Component Value Date/Time   HGB 11.7 (L) 08/06/2020 1247   HGB 9.9 (L) 03/07/2020 1532   HCT 31.4 (L) 08/06/2020 1247   HCT 28.7 (L) 03/07/2020 1532    Recent Labs  Lab 08/06/20 2035  LIPASE 87*   No results for input(s): AMMONIA in the last 168 hours.      ECG: not  Ordered      UA   ordered     CXR - NON acute  CTabd/pelvis - multiple cysts Within Spleen   RUQ [place]   ED Triage Vitals  Enc Vitals Group     BP 08/06/20 0953 (!) 151/95     Pulse Rate 08/06/20 0953 64     Resp 08/06/20 0953 (!) 23     Temp 08/06/20 0953 97.9 F (36.6 C)     Temp Source 08/06/20 0953 Oral     SpO2 08/06/20 0953 97 %     Weight 08/06/20 0946 146 lb (66.2 kg)     Height 08/06/20 0946 '5\' 3"'  (1.6 m)     Head Circumference --      Peak Flow --      Pain Score 08/06/20 0946 10     Pain Loc --      Pain Edu? --      Excl. in [place]? --   TMAX(24)@       Latest  Blood pressure (!) 149/97, pulse 60, temperature 97.9 F (36.6 C), temperature source Oral, resp. rate 18, height '5\' 3"'  (1.6 m), weight 66.2 kg, last menstrual period 07/31/2020, SpO2 98 %.     Review of Systems:    Pertinent positives include chills, fatigue,  abdominal pain, nausea, vomiting  Constitutional:  No weight loss, night sweats,  Fevers,  weight loss  HEENT:  No headaches, Difficulty swallowing,Tooth/dental problems,Sore throat,  No sneezing, itching, ear ache, nasal congestion, post nasal drip,  Cardio-vascular:  No chest pain, Orthopnea, PND, anasarca, dizziness, palpitations.no Bilateral lower extremity swelling  GI:  No heartburn, indigestion,, diarrhea, change in bowel habits, loss of appetite, melena, blood in stool, hematemesis Resp:  no shortness of breath at rest. No dyspnea on exertion, No excess mucus, no productive cough, No non-productive cough, No coughing up of blood.No change in color of mucus.No wheezing. Skin:  no rash or lesions. No jaundice GU:  no dysuria, change in color of urine, no urgency or frequency. No straining to urinate.  No flank pain.  Musculoskeletal:  No joint pain or no joint swelling. No decreased range of motion. No back pain.  Psych:  No change in mood or affect. No depression or anxiety. No memory loss.  Neuro: no localizing neurological complaints, no tingling, no weakness, no double vision, no gait abnormality, no slurred speech, no confusion  All systems reviewed and apart from [place] all are negative  Past Medical History:   Past Medical History:  Diagnosis Date  . Sickle cell anemia (HCC)       Past Surgical History:  Procedure Laterality Date  . EYE SURGERY  03/09/2017   left eye  . EYE SURGERY Right 09/2019   right [place]     Social History:  Ambulatory  independently       reports that she has quit smoking. Her smoking use  included cigarettes. She has never used smokeless tobacco. She reports previous alcohol use. She reports that she does not use drugs.     Family History:   Family History  Problem Relation Age of Onset  . Diabetes Father     Allergies: Allergies  Allergen Reactions  . Penicillins Swelling and Rash    Has patient had a PCN reaction causing immediate rash, facial/tongue/throat swelling, SOB or lightheadedness with  hypotension: Yes Has patient had a PCN reaction causing severe rash involving mucus membranes or skin necrosis: Yes Has patient had a PCN reaction that required hospitalization No Has patient had a PCN reaction occurring within the last 10 years: no If all of the above answers are "NO", then may proceed with Cephalosporin use.      Prior to Admission medications   Medication Sig Start Date End Date Taking? Authorizing Provider  albuterol (VENTOLIN HFA) 108 (90 Base) MCG/ACT inhaler Inhale 2 puffs into the lungs every 6 (six) hours as needed for wheezing or shortness of breath. Patient not taking: Reported on 07/24/2020 05/02/20   Vevelyn Francois, NP  citalopram (CELEXA) 10 MG tablet TAKE 1 TABLET(10 MG) BY MOUTH DAILY FOR 14 DAYS THEN TAKE 2 TABLETS(20 MG) BY MOUTH DAILY Patient taking differently: Take 20 mg by mouth daily.  06/09/20   Vevelyn Francois, NP  ergocalciferol (VITAMIN D2) 1.25 MG (50000 UT) capsule Take 1 capsule (50,000 Units total) by mouth once a week. Patient not taking: Reported on 07/24/2020 05/10/19   Lanae Boast, FNP  ibuprofen (ADVIL) 800 MG tablet Take 1 tablet (800 mg total) by mouth every 8 (eight) hours as needed for moderate pain. Take on full stomach. 05/28/20 08/26/20  Vevelyn Francois, NP  levocetirizine (XYZAL) 5 MG tablet Take 5 mg by mouth every evening.    [provider]  lidocaine (LIDODERM) 5 % Place 1 patch onto the skin daily. Remove & Discard patch within 12 hours or as directed by MD Patient taking differently: Place 1 patch onto the skin every 12 (twelve) hours as needed (pain). Remove & Discard patch within 12 hours or as directed by MD 05/16/20   Vevelyn Francois, NP  montelukast (SINGULAIR) 10 MG tablet Take 1 tablet (10 mg total) by mouth at bedtime. 05/02/20   Vevelyn Francois, NP  NARCAN 4 MG/0.1ML LIQD nasal spray kit Place 4 mg into the nose once as needed (overdose).  12/22/17   [provider]  ondansetron (ZOFRAN) 4 MG tablet Take 4 mg by  mouth every 8 (eight) hours as needed for nausea or vomiting.    [provider]  oxyCODONE (ROXICODONE) 15 MG immediate release tablet Take 1 tablet (15 mg total) by mouth every 4 (four) hours as needed for up to 15 days for pain (moderate to severe pain). 07/31/20 08/15/20  Azzie Glatter, FNP  pantoprazole (PROTONIX) 40 MG tablet Take 1 tablet (40 mg total) by mouth daily. 05/10/19   Lanae Boast, FNP  promethazine (PHENERGAN) 12.5 MG tablet Take 1 tablet (12.5 mg total) by mouth every 4 (four) hours as needed for nausea or vomiting. 02/25/20   Vevelyn Francois, NP  sucralfate (CARAFATE) 1 GM/10ML suspension Take 10 mLs (1 g total) by mouth 4 (four) times daily for 21 days. Patient taking differently: Take 1 g by mouth 4 (four) times daily as needed. Pt takes as needed for stomach pain 05/16/20 07/13/20  Tresa Garter, MD  tiZANidine (ZANAFLEX) 2 MG tablet TAKE 1  TABLET(2 MG) BY MOUTH EVERY 8 HOURS AS NEEDED FOR MUSCLE SPASMS Patient taking differently: Take 2 mg by mouth every 8 (eight) hours as needed for muscle spasms.  07/10/20   Vevelyn Francois, NP   Physical Exam: Vitals with BMI 08/06/2020 08/06/2020 08/06/2020  Height - - '5\' 3"'   Weight - - 146 lbs  BMI - - 26.41  Systolic 583 094 -  Diastolic 97 95 -  Pulse 60 64 -     1. General:  in No  Acute distress    Chronically ill -appearing 2. Psychological: Alert and  Oriented 3. Head/ENT:    Dry Mucous Membranes                          Head Non traumatic, neck supple                           Poor Dentition 4. SKIN:  decreased Skin turgor,  Skin clean Dry and intact no rash 5. Heart: Regular rate and rhythm no  Murmur, no Rub or gallop 6. Lungs:  Clear to auscultation bilaterally, no wheezes or crackles   7. Abdomen: Soft,  non-tender, Non distended bowel sounds present 8. Lower extremities: no clubbing, cyanosis, no edema 9. Neurologically Grossly intact, moving all 4 extremities equally   10. MSK: Normal range of  motion   All other LABS:     Recent Labs  Lab 08/06/20 1247  WBC 39.4*  NEUTROABS 36.9*  HGB 11.7*  HCT 31.4*  MCV 72.5*  PLT 283     Recent Labs  Lab 08/06/20 1247  NA 140  K 3.9  CL 105  CO2 23  GLUCOSE 108*  BUN 15  CREATININE 1.00  CALCIUM 8.7*     Recent Labs  Lab 08/06/20 1247  AST 198*  ALT 100*  ALKPHOS 58  BILITOT 1.6*  PROT 8.0  ALBUMIN 4.9       Cultures:    Component Value Date/Time   SDES THROAT 12/21/2017 1205   SPECREQUEST NONE Reflexed from [id number] 12/21/2017 1205   CULT NO GROUP A STREP (S.PYOGENES) ISOLATED 12/21/2017 1205   REPTSTATUS 12/23/2017 FINAL 12/21/2017 1205     Radiological Exams on Admission: DG Chest 1 View  Result Date: 08/06/2020 CLINICAL DATA:  Sickle cell crisis EXAM: CHEST  1 VIEW COMPARISON:  06/15/2020 FINDINGS: The heart size and mediastinal contours are within normal limits. Both lungs are clear. The visualized skeletal structures are unremarkable. IMPRESSION: No active disease. Electronically Signed   By: Davina Poke D.O.   On: 08/06/2020 16:56   CT ABDOMEN PELVIS W CONTRAST  Result Date: 08/06/2020 CLINICAL DATA:  Nausea and vomiting. EXAM: CT ABDOMEN AND PELVIS WITH CONTRAST TECHNIQUE: Multidetector CT imaging of the abdomen and pelvis was performed using the standard protocol following bolus administration of intravenous contrast. CONTRAST:  122m OMNIPAQUE IOHEXOL 300 MG/ML  SOLN COMPARISON:  July 13, 2020 FINDINGS: Lower chest: No acute abnormality. Hepatobiliary: No focal liver abnormality is seen. No gallstones, gallbladder wall thickening, or biliary dilatation. Pancreas: Unremarkable. No pancreatic ductal dilatation or surrounding inflammatory changes. Spleen: Multiple cysts are seen within a small spleen. This represents a new finding when compared to the prior study. The largest measures approximately 2.3 cm. Adrenals/Urinary Tract: Adrenal glands are unremarkable. Kidneys are normal, without renal  calculi, focal lesion, or hydronephrosis. Bladder is unremarkable. Stomach/Bowel: Stomach is within normal limits. Appendix appears normal.  No evidence of bowel wall thickening, distention, or inflammatory changes. Vascular/Lymphatic: No significant vascular findings are present. No enlarged abdominal or pelvic lymph nodes. Reproductive: Uterus and bilateral adnexa are unremarkable. Other: No abdominal wall hernia or abnormality. No abdominopelvic ascites. Musculoskeletal: No acute or significant osseous findings. IMPRESSION: Multiple cysts within a small spleen. This represents a new finding when compared to the prior study. Electronically Signed   By: Virgina Norfolk M.D.   On: 08/06/2020 17:37    Chart has been reviewed    Assessment/Plan  34 y.o. female with medical history significant of sickle cell disease, cocaine abuse,     Admitted for elevated LFT, Sickle cell pain crisis and leukocytosis  Present on Admission: . Sickle cell crisis ([place]) -- will admit per sickle cell protocol,    control pain,    hydrate with IVF D5 .45% Saline @ 100 mls/hour,    Weight based Dilaudid PCA for opioid tolerant patients.    continue hydroxyurea and folic acid   Transfuse as needed if Hg drops significantly below baseline.    No evidence of acute chest at this time   Sickle cell team to take over management in AM  . Chronic pain syndrome continue long-term pain medication.  Of note patient did pass his history of cocaine use will obtain U tox to reassess.  Defer to sickle cell primary team regarding long-term use of narcotics in this complicated patient   . Non-intractable vomiting -continue supportive management   . Elevated LFTs -unclear etiology check hepatitis serologies check CK.  Right upper quadrant ultrasound unremarkable bilirubin unremarkable no need to further question regarding alcohol use If continues to stay elevated may need to consult GI Check acetaminophen level and Ck 12:26  AM CK resulted in 7000 range likely explains LFT elevation Will rehydrate aggressivly and recheck in AM   rhabdomyolysis - rehydrate and follow renal function, likely explanation for elevated LFT's Check urine for cocaine as this can cause rhabdomyolisis   repeat CK in AM  . Cysts of spleen -unclear etiology per radiology unlikely to be source of infection does not appear to be as an abscess   . Leukocytosis -could be stress reaction in the setting of sickle cell pain crisis continue to monitor for any signs of infection blood cultures obtained given patient initially had SIRS parameters met  . SIRS (systemic inflammatory response syndrome) (HCC) -at this point unclear source of infection for tonight continue antibiotics and await results of blood cultures.  Also will order respiratory panel. Lactic acid elevated above 4 most likely secondary to dehydration.  Improving with IV fluid resuscitation  Other plan as per orders.  DVT prophylaxis:    Lovenox       Code Status:    Code Status: Prior FULL CODE  as per patient    I had personally discussed CODE STATUS with patient      Family Communication:   Family   at  Bedside    Disposition Plan:      To home once workup is complete and patient is stable   Following barriers for discharge:                                                         Pain controlled with PO medications  Afebrile, white count improving able to transition to PO antibiotics                             Will need to be able to tolerate PO                                                   Will need consultants to evaluate patient prior to discharge                                         Consults called: none  Admission status:  ED Disposition    ED Disposition Condition [place]: [facility] [[id number]]  Level of Care: Telemetry [5]  Admit to tele based on following criteria: Other see  comments  Comments: sepsis  May admit patient to [facility] or [facility] if equivalent level of care is available:: No  Covid Evaluation: Symptomatic Person Under Investigation (PUI)  Diagnosis: Sickle cell crisis [facility]) [[id number]]  Admitting Physician: Toy Baker [[id number]]  Attending Physician: Toy Baker [[id number]]  Estimated length of stay: past midnight tomorrow  Certification:: I certify this patient will need inpatient services for at least 2 midnights         inpatient     I Expect 2 midnight stay secondary to severity of patient's current illness need for inpatient interventions justified by the following:  hemodynamic instability despite optimal treatment (tachycardia    Severe lab/radiological/exam abnormalities including:    LFT elevation and extensive comorbidities including:  substance abuse      sickle cell disease  .    That are currently affecting medical management.   I expect  patient to be hospitalized for 2 midnights requiring inpatient medical care.  Patient is at high risk for adverse outcome (such as loss of life or disability) if not treated.  Indication for inpatient stay as follows:    severe pain requiring acute inpatient management,     Need for IV antibiotics, IV fluids    Level of care    tele  For 12H    Lab Results  Component Value Date   [place] NEGATIVE 08/06/2020     Precautions: admitted as  Covid Negative   PPE: Used by the provider:   P100  eye Goggles,  Gloves      Amairany Schumpert 08/07/2020, 12:34 AM    [facility]     after 2 AM please page floor coverage PA If 7AM-7PM, please contact the day team taking care of the patient using [email or web address]   Patient was evaluated in the context of the global COVID-19 pandemic, which necessitated consideration that the patient might be at risk for infection with the SARS-CoV-2 virus that causes COVID-19. Institutional protocols and algorithms that pertain  to the evaluation of patients at risk for COVID-19 are in a state of rapid change based on information released by regulatory bodies including the CDC and federal and state organizations. These policies and algorithms were followed during the patient's care.

## 2020-08-06 NOTE — Progress Notes (Signed)
A consult was received from an ED physician for vancomycin and cefepime per pharmacy dosing.  The patient's profile has been reviewed for ht/wt/allergies/indication/available labs.    - Dr. Dalene Seltzer is aware of noted rxns to PCN and is ok to proceed with cefepime.  A one time order has been placed for vancomycin 1000 mg and cefepime 2gm IV x1.  Further antibiotics/pharmacy consults should be ordered by admitting physician if indicated.                       Thank you, Lucia Gaskins 08/06/2020  6:35 PM

## 2020-08-07 DIAGNOSIS — M6282 Rhabdomyolysis: Secondary | ICD-10-CM | POA: Diagnosis present

## 2020-08-07 LAB — RAPID URINE DRUG SCREEN, HOSP PERFORMED
Amphetamines: NOT DETECTED
Barbiturates: NOT DETECTED
Benzodiazepines: NOT DETECTED
Cocaine: NOT DETECTED
Opiates: POSITIVE — AB
Tetrahydrocannabinol: NOT DETECTED

## 2020-08-07 LAB — ACETAMINOPHEN LEVEL: Acetaminophen (Tylenol), Serum: <10 ug/mL — ABNORMAL LOW (ref 10–30)

## 2020-08-07 LAB — MAGNESIUM: Magnesium: 1.6 mg/dL — ABNORMAL LOW (ref 1.7–2.4)

## 2020-08-07 LAB — PHOSPHORUS: Phosphorus: 2 mg/dL — ABNORMAL LOW (ref 2.5–4.6)

## 2020-08-07 LAB — URINALYSIS, ROUTINE W REFLEX MICROSCOPIC
Bacteria, UA: NONE SEEN
Bilirubin Urine: NEGATIVE
Glucose, UA: NEGATIVE mg/dL
Ketones, ur: NEGATIVE mg/dL
Leukocytes,Ua: NEGATIVE
Nitrite: NEGATIVE
Protein, ur: NEGATIVE mg/dL
Specific Gravity, Urine: 1.04 — ABNORMAL HIGH (ref 1.005–1.030)
pH: 5 (ref 5.0–8.0)

## 2020-08-07 LAB — LIPID PANEL
Cholesterol: 113 mg/dL (ref 0–200)
HDL: 38 mg/dL — ABNORMAL LOW (ref >40)
LDL Cholesterol: 65 mg/dL (ref 0–99)
Total CHOL/HDL Ratio: 3 RATIO
Triglycerides: 52 mg/dL (ref <150)
VLDL: 10 mg/dL (ref 0–40)

## 2020-08-07 LAB — CK
Total CK: 8539 U/L — ABNORMAL HIGH (ref 38–234)
Total CK: 5784 U/L — ABNORMAL HIGH (ref 38–234)

## 2020-08-07 LAB — HEPATITIS PANEL, ACUTE
HCV Ab: NONREACTIVE
Hep A IgM: NONREACTIVE
Hep B C IgM: NONREACTIVE
Hepatitis B Surface Ag: NONREACTIVE

## 2020-08-07 LAB — LACTIC ACID, PLASMA: Lactic Acid, Venous: 1.4 mmol/L (ref 0.5–1.9)

## 2020-08-07 MED ORDER — SODIUM CHLORIDE 0.9% FLUSH: 9.0000 mL | INTRAVENOUS | Status: DC | PRN

## 2020-08-07 MED ORDER — CITALOPRAM HYDROBROMIDE 20 MG PO TABS
20.0000 mg | ORAL_TABLET | Freq: Every day | ORAL | Status: DC
  Administered 2020-08-07 – 2020-08-10 (×4): 20 mg via ORAL
  Filled 2020-08-07: qty 2
  Filled 2020-08-07 (×3): qty 1

## 2020-08-07 MED ORDER — BISACODYL 10 MG RE SUPP: 10.0000 mg | Freq: Every day | RECTAL | Status: DC | PRN

## 2020-08-07 MED ORDER — LIP MEDEX EX OINT
TOPICAL_OINTMENT | Freq: Once | CUTANEOUS | Status: AC
  Filled 2020-08-07: qty 7

## 2020-08-07 MED ORDER — POLYETHYLENE GLYCOL 3350 17 G PO PACK: 17.0000 g | PACK | Freq: Every day | ORAL | Status: DC | PRN

## 2020-08-07 MED ORDER — SODIUM CHLORIDE 0.9 % IV SOLN
INTRAVENOUS | Status: DC | PRN
  Administered 2020-08-07: 500 mL via INTRAVENOUS

## 2020-08-07 MED ORDER — DIPHENHYDRAMINE HCL 25 MG PO CAPS
25.0000 mg | ORAL_CAPSULE | ORAL | Status: DC | PRN
  Administered 2020-08-07: 25 mg via ORAL
  Filled 2020-08-07: qty 1

## 2020-08-07 MED ORDER — KETOROLAC TROMETHAMINE 30 MG/ML IJ SOLN
15.0000 mg | Freq: Four times a day (QID) | INTRAMUSCULAR | Status: DC
  Administered 2020-08-07 – 2020-08-10 (×9): 15 mg via INTRAVENOUS
  Filled 2020-08-07 (×10): qty 1

## 2020-08-07 MED ORDER — ONDANSETRON HCL 4 MG PO TABS: 4.0000 mg | ORAL_TABLET | ORAL | Status: DC | PRN

## 2020-08-07 MED ORDER — TIZANIDINE HCL 4 MG PO TABS
2.0000 mg | ORAL_TABLET | Freq: Three times a day (TID) | ORAL | Status: DC | PRN
  Administered 2020-08-07 – 2020-08-10 (×2): 2 mg via ORAL
  Filled 2020-08-07 (×3): qty 1

## 2020-08-07 MED ORDER — ONDANSETRON HCL 4 MG/2ML IJ SOLN
4.0000 mg | Freq: Four times a day (QID) | INTRAMUSCULAR | Status: DC | PRN
  Administered 2020-08-08: 4 mg via INTRAVENOUS

## 2020-08-07 MED ORDER — ENOXAPARIN SODIUM 40 MG/0.4ML ~~LOC~~ SOLN
40.0000 mg | SUBCUTANEOUS | Status: DC
  Administered 2020-08-07 – 2020-08-09 (×3): 40 mg via SUBCUTANEOUS
  Filled 2020-08-07 (×4): qty 0.4

## 2020-08-07 MED ORDER — SODIUM CHLORIDE 0.9 % IV SOLN
25.0000 mg | INTRAVENOUS | Status: DC | PRN
  Filled 2020-08-07: qty 0.5

## 2020-08-07 MED ORDER — PANTOPRAZOLE SODIUM 40 MG PO TBEC
40.0000 mg | DELAYED_RELEASE_TABLET | Freq: Every day | ORAL | Status: DC
  Administered 2020-08-07 – 2020-08-10 (×4): 40 mg via ORAL
  Filled 2020-08-07 (×4): qty 1

## 2020-08-07 MED ORDER — SODIUM CHLORIDE 0.9 % IV SOLN: INTRAVENOUS | Status: DC | PRN

## 2020-08-07 MED ORDER — HYDROXYZINE HCL 25 MG PO TABS
25.0000 mg | ORAL_TABLET | ORAL | Status: DC | PRN
  Administered 2020-08-07: 50 mg via ORAL
  Filled 2020-08-07: qty 2

## 2020-08-07 MED ORDER — MONTELUKAST SODIUM 10 MG PO TABS
10.0000 mg | ORAL_TABLET | Freq: Every day | ORAL | Status: DC
  Administered 2020-08-07 – 2020-08-09 (×3): 10 mg via ORAL
  Filled 2020-08-07 (×3): qty 1

## 2020-08-07 MED ORDER — DEXTROSE-NACL 5-0.45 % IV SOLN: INTRAVENOUS | Status: DC

## 2020-08-07 MED ORDER — LACTATED RINGERS IV SOLN: INTRAVENOUS | Status: DC

## 2020-08-07 MED ORDER — MORPHINE SULFATE ER 30 MG PO TBCR
30.0000 mg | EXTENDED_RELEASE_TABLET | Freq: Two times a day (BID) | ORAL | Status: DC
  Administered 2020-08-07 – 2020-08-10 (×7): 30 mg via ORAL
  Filled 2020-08-07 (×2): qty 1
  Filled 2020-08-07: qty 2
  Filled 2020-08-07 (×4): qty 1

## 2020-08-07 MED ORDER — NALOXONE HCL 0.4 MG/ML IJ SOLN: 0.4000 mg | INTRAMUSCULAR | Status: DC | PRN

## 2020-08-07 MED ORDER — KETOROLAC TROMETHAMINE 30 MG/ML IJ SOLN
30.0000 mg | Freq: Four times a day (QID) | INTRAMUSCULAR | Status: DC
  Administered 2020-08-07: 30 mg via INTRAVENOUS
  Filled 2020-08-07 (×2): qty 1

## 2020-08-07 MED ORDER — HYDROMORPHONE 1 MG/ML IV SOLN
INTRAVENOUS | Status: DC
  Administered 2020-08-07: 30 mg via INTRAVENOUS
  Administered 2020-08-07: 4.5 mg via INTRAVENOUS
  Administered 2020-08-08: 1 mg via INTRAVENOUS
  Administered 2020-08-08: 30 mg via INTRAVENOUS
  Administered 2020-08-08: 6.5 mg via INTRAVENOUS
  Administered 2020-08-08: 2.5 mg via INTRAVENOUS
  Administered 2020-08-08: 5 mg via INTRAVENOUS
  Administered 2020-08-08: 5.5 mg via INTRAVENOUS
  Administered 2020-08-09: 2.5 mg via INTRAVENOUS
  Administered 2020-08-09: 3.5 mg via INTRAVENOUS
  Administered 2020-08-09: 30 mg via INTRAVENOUS
  Administered 2020-08-10: 5 mg via INTRAVENOUS
  Administered 2020-08-10: 9 mg via INTRAVENOUS
  Filled 2020-08-07 (×3): qty 30

## 2020-08-07 MED ORDER — SODIUM CHLORIDE 0.9 % IV BOLUS
1000.0000 mL | Freq: Once | INTRAVENOUS | Status: AC
  Administered 2020-08-07: 1000 mL via INTRAVENOUS

## 2020-08-07 MED ORDER — SENNOSIDES-DOCUSATE SODIUM 8.6-50 MG PO TABS
1.0000 | ORAL_TABLET | Freq: Two times a day (BID) | ORAL | Status: DC
  Administered 2020-08-07 – 2020-08-10 (×4): 1 via ORAL
  Filled 2020-08-07 (×6): qty 1

## 2020-08-07 MED ORDER — ONDANSETRON HCL 4 MG/2ML IJ SOLN
4.0000 mg | INTRAMUSCULAR | Status: DC | PRN
  Administered 2020-08-08: 4 mg via INTRAVENOUS
  Filled 2020-08-07: qty 2

## 2020-08-07 MED ORDER — KETOROLAC TROMETHAMINE 30 MG/ML IJ SOLN
30.0000 mg | Freq: Once | INTRAMUSCULAR | Status: AC
  Administered 2020-08-07: 30 mg via INTRAVENOUS

## 2020-08-07 MED ORDER — HYDROMORPHONE HCL 1 MG/ML IJ SOLN
1.0000 mg | INTRAMUSCULAR | Status: DC | PRN
  Administered 2020-08-06 – 2020-08-07 (×6): 1 mg via INTRAVENOUS
  Filled 2020-08-07 (×4): qty 1

## 2020-08-07 NOTE — ED Notes (Signed)
Pt in restroom 

## 2020-08-07 NOTE — Progress Notes (Signed)
Patient arrives to room 1524 at this time via stretcher from ED. Patient independent from stretcher to bed with steady gait noted.

## 2020-08-07 NOTE — ED Provider Notes (Signed)
[facility] DEPT Provider Note   CSN: [id number] Arrival date & time: 08/06/20  0941     History No chief complaint on file.   Hannah Hawkins is a 34 y.o. female.  HPI     34 year old female with history of sickle cell anemia, chronic pain, cocaine use, presents with concern for pain similar to sickle cell pain crises in legs, with new onset of nausea, vomiting and abdominal pain.  Reports that symptoms began yesterday with increasing leg pain.  Reports last night she woke up with nausea and vomiting at 2 AM.  Describes the abdominal pain as periumbilical with radiation to the epigastrium and bilateral with left and right upper quadrant pain.  No known fevers.  No chest pain, shortness of breath.  No rashes.   Past Medical History:  Diagnosis Date  . Sickle cell anemia [facility])     Patient Active Problem List   Diagnosis Date Noted  . Sickle cell crisis ([place]) 08/06/2020  . Elevated LFTs 08/06/2020  . 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 Leukocytosis 08/06/2020  . SIRS (systemic inflammatory response syndrome) ([place]) 08/06/2020  . Non-intractable vomiting   . Joint pain in fingers of both hands 06/11/2020  . Hypokalemia 07/17/2019  . GERD (gastroesophageal reflux disease) 05/29/2019  . History of cocaine use 05/18/2019  . Chronic pain syndrome   . Cocaine use   . Sickle cell pain crisis ([place]) 11/14/2017  . Anemia of chronic disease 11/13/2017  . Sinusitis, chronic 11/13/2017  . 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 EYE SURGERY  03/09/2017   left eye  . EYE SURGERY Right 09/2019   right hemerage      OB History    Gravida  2   Para      Term      Preterm      AB      Living  1     SAB      TAB      Ectopic      Multiple      Live Births              Family History  Problem Relation Age of Onset  . Diabetes Father     Social History   Tobacco Use  .  Smoking status: Former Smoker    Types: Cigarettes  . Smokeless tobacco: Never Used  Vaping Use  . Vaping Use: Never used  Substance Use Topics  . Alcohol use: Not Currently  . Drug use: No    Home Medications Prior to Admission medications   Medication Sig Start Date End Date Taking? Authorizing Provider  citalopram (CELEXA) 10 MG tablet TAKE 1 TABLET(10 MG) BY MOUTH DAILY FOR 14 DAYS THEN TAKE 2 TABLETS(20 MG) BY MOUTH DAILY Patient taking differently: Take 20 mg by mouth daily.  06/09/20  Yes Vevelyn Francois, NP  ibuprofen (ADVIL) 800 MG tablet Take 1 tablet (800 mg total) by mouth every 8 (eight) hours as needed for moderate pain. Take on full stomach. 05/28/20 08/26/20 Yes King, Diona Foley, NP  Iron-Vitamins (GERITOL PO) Take 1 tablet by mouth daily.   Yes [provider]  levocetirizine (XYZAL) 5 MG tablet Take 5 mg by mouth every evening.   Yes [provider]  lidocaine (LIDODERM) 5 % Place 1 patch onto the skin daily. Remove & Discard patch within 12 hours or as directed by MD Patient taking differently:  Place 1 patch onto the skin every 12 (twelve) hours as needed (pain). Remove & Discard patch within 12 hours or as directed by MD 05/16/20  Yes Vevelyn Francois, NP  montelukast (SINGULAIR) 10 MG tablet Take 1 tablet (10 mg total) by mouth at bedtime. 05/02/20  Yes Vevelyn Francois, NP  morphine (MS CONTIN) 30 MG 12 hr tablet Take 30 mg by mouth 2 (two) times daily as needed for pain.  07/25/20  Yes [provider]  NARCAN 4 MG/0.1ML LIQD nasal spray kit Place 4 mg into the nose once as needed (overdose).  12/22/17  Yes [provider]  ondansetron (ZOFRAN) 4 MG tablet Take 4 mg by mouth every 8 (eight) hours as needed for nausea or vomiting.   Yes [provider]  oxyCODONE (ROXICODONE) 15 MG immediate release tablet Take 1 tablet (15 mg total) by mouth every 4 (four) hours as needed for up to 15 days for pain (moderate to severe pain). 07/31/20 08/15/20  Yes Azzie Glatter, FNP  pantoprazole (PROTONIX) 40 MG tablet Take 1 tablet (40 mg total) by mouth daily. 05/10/19  Yes Lanae Boast, FNP  promethazine (PHENERGAN) 12.5 MG tablet Take 1 tablet (12.5 mg total) by mouth every 4 (four) hours as needed for nausea or vomiting. 02/25/20  Yes King, Diona Foley, NP  tiZANidine (ZANAFLEX) 2 MG tablet TAKE 1 TABLET(2 MG) BY MOUTH EVERY 8 HOURS AS NEEDED FOR MUSCLE SPASMS Patient taking differently: Take 2 mg by mouth every 8 (eight) hours as needed for muscle spasms.  07/10/20  Yes Vevelyn Francois, NP  albuterol (VENTOLIN HFA) 108 (90 Base) MCG/ACT inhaler Inhale 2 puffs into the lungs every 6 (six) hours as needed for wheezing or shortness of breath. Patient not taking: Reported on 07/24/2020 05/02/20   Vevelyn Francois, NP  ergocalciferol (VITAMIN D2) 1.25 MG (50000 UT) capsule Take 1 capsule (50,000 Units total) by mouth once a week. Patient not taking: Reported on 07/24/2020 05/10/19   Lanae Boast, FNP  sucralfate (CARAFATE) 1 GM/10ML suspension Take 10 mLs (1 g total) by mouth 4 (four) times daily for 21 days. Patient taking differently: Take 1 g by mouth 4 (four) times daily as needed. Pt takes as needed for stomach pain 05/16/20 07/13/20  Tresa Garter, MD    Allergies    Penicillins  Review of Systems   Review of Systems  Constitutional: Negative for fever.  HENT: Negative for sore throat.   Eyes: Negative for visual disturbance.  Respiratory: Negative for cough and shortness of breath.   Cardiovascular: Negative for chest pain.  Gastrointestinal: Positive for abdominal pain, nausea and vomiting.  Genitourinary: Negative for difficulty urinating.  Musculoskeletal: Positive for arthralgias and back pain. Negative for neck pain.  Skin: Negative for rash.  Neurological: Negative for syncope, light-headedness and headaches.    Physical Exam Updated Vital Signs BP 134/76   Pulse 70   Temp 97.9 F (36.6 C) (Oral)   Resp 18   Ht '5\' 3"'   (1.6 m)   Wt 66.2 kg   LMP 07/31/2020 (Approximate)   SpO2 98%   BMI 25.86 kg/m   Physical Exam Vitals and nursing note reviewed.  Constitutional:      General: She is not in acute distress.    Appearance: She is well-developed. She is not diaphoretic.  HENT:     Head: Normocephalic and atraumatic.  Eyes:     Conjunctiva/sclera: Conjunctivae normal.  Cardiovascular:     Rate and  Rhythm: Normal rate and regular rhythm.     Heart sounds: Normal heart sounds. No murmur heard.  No friction rub. No gallop.   Pulmonary:     Effort: Pulmonary effort is normal. No respiratory distress.     Breath sounds: Normal breath sounds. No wheezing or rales.  Abdominal:     General: There is no distension.     Palpations: Abdomen is soft.     Tenderness: There is abdominal tenderness in the epigastric area. There is no guarding. Positive signs include Murphy's sign.  Musculoskeletal:        General: No tenderness.     Cervical back: Normal range of motion.  Skin:    General: Skin is warm and dry.     Findings: No erythema or rash.  Neurological:     Mental Status: She is alert and oriented to person, place, and time.     ED Results / Procedures / Treatments   Labs (all labs ordered are listed, but only abnormal results are displayed) Labs Reviewed  COMPREHENSIVE METABOLIC PANEL - Abnormal; Notable for the following components:      Result Value   Glucose, Bld 108 (*)    Calcium 8.7 (*)    AST 198 (*)    ALT 100 (*)    Total Bilirubin 1.6 (*)    All other components within normal limits  CBC WITH DIFFERENTIAL/PLATELET - Abnormal; Notable for the following components:   WBC 39.4 (*)    Hemoglobin 11.7 (*)    HCT 31.4 (*)    MCV 72.5 (*)    MCHC 37.3 (*)    nRBC 0.8 (*)    Neutro Abs 36.9 (*)    Monocytes Absolute 1.3 (*)    Abs Immature Granulocytes 0.38 (*)    All other components within normal limits  RETICULOCYTES - Abnormal; Notable for the following components:   Retic  Ct Pct 4.6 (*)    Retic Count, Absolute 189.5 (*)    All other components within normal limits  LACTIC ACID, PLASMA - Abnormal; Notable for the following components:   Lactic Acid, Venous 3.2 (*)    All other components within normal limits  LACTIC ACID, PLASMA - Abnormal; Notable for the following components:   Lactic Acid, Venous 4.1 (*)    All other components within normal limits  LIPASE, BLOOD - Abnormal; Notable for the following components:   Lipase 87 (*)    All other components within normal limits  LACTIC ACID, PLASMA - Abnormal; Notable for the following components:   Lactic Acid, Venous 2.7 (*)    All other components within normal limits  CK - Abnormal; Notable for the following components:   Total CK 7,103 (*)    All other components within normal limits  ACETAMINOPHEN LEVEL - Abnormal; Notable for the following components:   Acetaminophen (Tylenol), Serum <10 (*)    All other components within normal limits  SARS CORONAVIRUS 2 BY RT PCR (HOSPITAL ORDER, [facility])  CULTURE, BLOOD (ROUTINE X 2)  CULTURE, BLOOD (ROUTINE X 2)  URINE CULTURE  RESPIRATORY PANEL BY PCR  LACTIC ACID, PLASMA  PROCALCITONIN  PROTIME-INR  FIBRINOGEN  MAGNESIUM  RAPID URINE DRUG SCREEN, HOSP PERFORMED  HEPATITIS PANEL, ACUTE  LIPID PANEL  URINALYSIS, ROUTINE W REFLEX MICROSCOPIC  I-STAT BETA HCG BLOOD, ED ([facility], [facility], [facility] ONLY)    EKG None  Radiology DG Chest 1 View  Result Date: 08/06/2020 CLINICAL DATA:  Sickle cell  crisis EXAM: CHEST  1 VIEW COMPARISON:  06/15/2020 FINDINGS: The heart size and mediastinal contours are within normal limits. Both lungs are clear. The visualized skeletal structures are unremarkable. IMPRESSION: No active disease. Electronically Signed   By: Davina Poke D.O.   On: 08/06/2020 16:56   CT ABDOMEN PELVIS W CONTRAST  Result Date: 08/06/2020 CLINICAL DATA:  Nausea and vomiting. EXAM: CT ABDOMEN AND PELVIS WITH CONTRAST  TECHNIQUE: Multidetector CT imaging of the abdomen and pelvis was performed using the standard protocol following bolus administration of intravenous contrast. CONTRAST:  170m OMNIPAQUE IOHEXOL 300 MG/ML  SOLN COMPARISON:  July 13, 2020 FINDINGS: Lower chest: No acute abnormality. Hepatobiliary: No focal liver abnormality is seen. No gallstones, gallbladder wall thickening, or biliary dilatation. Pancreas: Unremarkable. No pancreatic ductal dilatation or surrounding inflammatory changes. Spleen: Multiple cysts are seen within a small spleen. This represents a new finding when compared to the prior study. The largest measures approximately 2.3 cm. Adrenals/Urinary Tract: Adrenal glands are unremarkable. Kidneys are normal, without renal calculi, focal lesion, or hydronephrosis. Bladder is unremarkable. Stomach/Bowel: Stomach is within normal limits. Appendix appears normal. No evidence of bowel wall thickening, distention, or inflammatory changes. Vascular/Lymphatic: No significant vascular findings are present. No enlarged abdominal or pelvic lymph nodes. Reproductive: Uterus and bilateral adnexa are unremarkable. Other: No abdominal wall hernia or abnormality. No abdominopelvic ascites. Musculoskeletal: No acute or significant osseous findings. IMPRESSION: Multiple cysts within a small spleen. This represents a new finding when compared to the prior study. Electronically Signed   By: TVirgina NorfolkM.D.   On: 08/06/2020 17:37   UKoreaAbdomen Limited RUQ  Result Date: 08/06/2020 CLINICAL DATA:  Right upper quadrant pain for 2 weeks. EXAM: ULTRASOUND ABDOMEN LIMITED RIGHT UPPER QUADRANT COMPARISON:  CT earlier today. FINDINGS: Gallbladder: Physiologically distended. No gallstones or wall thickening visualized. No sonographic Murphy sign noted by sonographer. Common bile duct: Diameter: 2 mm, normal. Liver: No focal lesion identified. Within normal limits in parenchymal echogenicity. Portal vein is patent on  color Doppler imaging with normal direction of blood flow towards the liver. Other: No right upper quadrant ascites. IMPRESSION: Negative right upper quadrant ultrasound. Electronically Signed   By: MKeith RakeM.D.   On: 08/06/2020 21:17    Procedures Procedures (including critical care time)  Medications Ordered in ED Medications  sodium chloride (PF) 0.9 % injection (has no administration in time range)  ondansetron (ZOFRAN) injection 4 mg (has no administration in time range)  0.45 % sodium chloride infusion ( Intravenous New Bag/Given 08/06/20 2100)  metroNIDAZOLE (FLAGYL) IVPB 500 mg (0 mg Intravenous Stopped 08/06/20 2026)  ceFEPIme (MAXIPIME) 2 g in sodium chloride 0.9 % 100 mL IVPB (has no administration in time range)  vancomycin (VANCOREADY) IVPB 750 mg/150 mL (has no administration in time range)  HYDROmorphone (DILAUDID) injection 1 mg (has no administration in time range)  ondansetron (ZOFRAN) injection 4 mg (4 mg Intravenous Given 08/06/20 1400)  iohexol (OMNIPAQUE) 300 MG/ML solution 100 mL (100 mLs Intravenous Contrast Given 08/06/20 1652)  HYDROmorphone (DILAUDID) injection 2 mg (2 mg Intravenous Given 08/06/20 1809)  HYDROmorphone (DILAUDID) injection 2 mg (2 mg Intravenous Given 08/06/20 1900)  diphenhydrAMINE (BENADRYL) injection 25 mg (25 mg Intravenous Given 08/06/20 1808)  lactated ringers bolus 1,000 mL (0 mLs Intravenous Stopped 08/06/20 1959)  lactated ringers bolus 1,000 mL ( Intravenous Rate/Dose Verify 08/06/20 2316)  vancomycin (VANCOCIN) IVPB 1000 mg/200 mL premix ( Intravenous Stopped 08/06/20 2221)  ceFEPIme (MAXIPIME) 2 g in sodium chloride  0.9 % 100 mL IVPB (0 g Intravenous Stopped 08/06/20 2026)    ED Course  I have reviewed the triage vital signs and the nursing notes.  Pertinent labs & imaging results that were available during my care of the patient were reviewed by me and considered in my medical decision making (see chart for details).    MDM  Rules/Calculators/A&P                          34 year old female with history of sickle cell anemia, chronic pain, cocaine use, presents with concern for pain similar to sickle cell pain crises in legs, with new onset of nausea, vomiting and abdominal pain.  CT abdomen shows multiple cysts within a small spleen which is new from prior, no signs of abscess, no signs of biliary abnormalities on CT.  She does have persistent right upper quadrant tenderness on exam, elevated LFTs and bilirubin, plan for right upper quadrant ultrasound for further evaluation.  Labs are significant for a white count of 38,000, lactic acid of 4.  Given 30/kg IV fluids, as well as half-normal infusion for sickle cell pain.  Blood cultures and empiric antibiotics ordered.  Will admit for further care given concern for SIRS with high risk of bacterial infection.   Final Clinical Impression(s) / ED Diagnoses Final diagnoses:  RUQ abdominal pain  Epigastric pain  Leukocytosis, unspecified type  Lactic acidosis    Rx / DC Orders ED Discharge Orders    None       Gareth Morgan, MD 08/07/20 [phone]

## 2020-08-08 ENCOUNTER — Encounter (HOSPITAL_COMMUNITY): Payer: Self-pay | Admitting: Internal Medicine

## 2020-08-08 DIAGNOSIS — Z87898 Personal history of other specified conditions: Secondary | ICD-10-CM

## 2020-08-08 DIAGNOSIS — M6282 Rhabdomyolysis: Secondary | ICD-10-CM

## 2020-08-08 DIAGNOSIS — D734 Cyst of spleen: Secondary | ICD-10-CM

## 2020-08-08 DIAGNOSIS — D72829 Elevated white blood cell count, unspecified: Secondary | ICD-10-CM

## 2020-08-08 DIAGNOSIS — G894 Chronic pain syndrome: Secondary | ICD-10-CM

## 2020-08-08 DIAGNOSIS — E872 Acidosis, unspecified: Secondary | ICD-10-CM

## 2020-08-08 DIAGNOSIS — R1110 Vomiting, unspecified: Secondary | ICD-10-CM

## 2020-08-08 DIAGNOSIS — R1013 Epigastric pain: Secondary | ICD-10-CM

## 2020-08-08 DIAGNOSIS — R1011 Right upper quadrant pain: Secondary | ICD-10-CM

## 2020-08-08 DIAGNOSIS — D5700 Hb-SS disease with crisis, unspecified: Principal | ICD-10-CM

## 2020-08-08 DIAGNOSIS — R7989 Other specified abnormal findings of blood chemistry: Secondary | ICD-10-CM

## 2020-08-08 LAB — CBC
HCT: 25.8 % — ABNORMAL LOW (ref 36.0–46.0)
Hemoglobin: 9.4 g/dL — ABNORMAL LOW (ref 12.0–15.0)
MCH: 26.4 pg (ref 26.0–34.0)
MCHC: 36.4 g/dL — ABNORMAL HIGH (ref 30.0–36.0)
MCV: 72.5 fL — ABNORMAL LOW (ref 80.0–100.0)
Platelets: 267 10*3/uL (ref 150–400)
RBC: 3.56 MIL/uL — ABNORMAL LOW (ref 3.87–5.11)
RDW: 15.1 % (ref 11.5–15.5)
WBC: 13.5 10*3/uL — ABNORMAL HIGH (ref 4.0–10.5)
nRBC: 2.8 % — ABNORMAL HIGH (ref 0.0–0.2)

## 2020-08-08 LAB — RESPIRATORY PANEL BY PCR

## 2020-08-08 LAB — COMPREHENSIVE METABOLIC PANEL
ALT: 329 U/L — ABNORMAL HIGH (ref 0–44)
AST: 381 U/L — ABNORMAL HIGH (ref 15–41)
Albumin: 3.7 g/dL (ref 3.5–5.0)
Alkaline Phosphatase: 43 U/L (ref 38–126)
Anion gap: 8 (ref 5–15)
BUN: 5 mg/dL — ABNORMAL LOW (ref 6–20)
CO2: 26 mmol/L (ref 22–32)
Calcium: 8.3 mg/dL — ABNORMAL LOW (ref 8.9–10.3)
Chloride: 105 mmol/L (ref 98–111)
Creatinine, Ser: 0.67 mg/dL (ref 0.44–1.00)
GFR calc Af Amer: >60 mL/min (ref >60)
GFR calc non Af Amer: >60 mL/min (ref >60)
Glucose, Bld: 114 mg/dL — ABNORMAL HIGH (ref 70–99)
Potassium: 2.9 mmol/L — ABNORMAL LOW (ref 3.5–5.1)
Sodium: 139 mmol/L (ref 135–145)
Total Bilirubin: 1 mg/dL (ref 0.3–1.2)
Total Protein: 6.2 g/dL — ABNORMAL LOW (ref 6.5–8.1)

## 2020-08-08 LAB — CK: Total CK: 3817 U/L — ABNORMAL HIGH (ref 38–234)

## 2020-08-08 LAB — URINE CULTURE: Culture: NO GROWTH

## 2020-08-08 LAB — POTASSIUM: Potassium: 3.2 mmol/L — ABNORMAL LOW (ref 3.5–5.1)

## 2020-08-08 MED ORDER — MAGNESIUM SULFATE 50 % IJ SOLN: 1.0000 g | Freq: Once | INTRAMUSCULAR | Status: DC

## 2020-08-08 MED ORDER — SODIUM CHLORIDE 0.9 % IV SOLN: INTRAVENOUS | Status: DC

## 2020-08-08 MED ORDER — KETOROLAC TROMETHAMINE 15 MG/ML IJ SOLN
15.0000 mg | Freq: Once | INTRAMUSCULAR | Status: AC
  Administered 2020-08-08: 15 mg via INTRAVENOUS

## 2020-08-08 MED ORDER — K PHOS MONO-SOD PHOS DI & MONO 155-852-130 MG PO TABS
250.0000 mg | ORAL_TABLET | Freq: Every day | ORAL | Status: DC
  Administered 2020-08-08 – 2020-08-10 (×3): 250 mg via ORAL
  Filled 2020-08-08 (×3): qty 1

## 2020-08-08 MED ORDER — MAGNESIUM SULFATE IN D5W 1-5 GM/100ML-% IV SOLN
1.0000 g | Freq: Once | INTRAVENOUS | Status: AC
  Administered 2020-08-08: 1 g via INTRAVENOUS
  Filled 2020-08-08: qty 100

## 2020-08-08 MED ORDER — POTASSIUM CHLORIDE CRYS ER 20 MEQ PO TBCR
40.0000 meq | EXTENDED_RELEASE_TABLET | Freq: Once | ORAL | Status: AC
  Administered 2020-08-08: 40 meq via ORAL
  Filled 2020-08-08: qty 2

## 2020-08-08 NOTE — Progress Notes (Signed)
Patient is restless, tossing and turning get herself tangled in IV tubing and PCA pump tubing. Patient continue to remove the sensor in her nose while self administering pain meds. Patient is saying that she can't sleep due to the beeping. This nurse explained to her that her respirations are running 6-8 with some shallow breathing. She was also told in order to continue to self administer pain meds the sensor need to remain in her nose. Patient states she have nasal congestion and the sensor make her breathing harder. This nurse offered to page on-call attending to help with congestion and the patient refused. Patient is turning the monitor off when it beeps and is asked by this nurse to allow me time to come in and see why the pump is beeping which is due to her respirations. Will update on-coming shift and continue to monitor the patient.

## 2020-08-08 NOTE — Progress Notes (Signed)
PCA pump restarted at 0315. Will continue to monitor the patient.

## 2020-08-08 NOTE — Plan of Care (Signed)
Was notified the patient was still on droplet precautions It appears that her IV fluids has run out Ordered repeat labs repeat CK Has been improving but would recommend following up and repeating Would continue to follow renal function Restarted IV fluids Discussed with night coverage Magnesium and phosphate correction orders ordered Please defer to care of this patient to the  sickle cell primary team during the daytime. [facility] hospitalist happy to assist with nighttime coverage Stacey Sago 2:22 AM

## 2020-08-08 NOTE — Progress Notes (Signed)
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 Resp 9 with shallow breathing and O2 sat 87-90%. Pt is falling asleep while in conversation with this nurse. Pt said she did not want to wear the sensor in her nose due to congestion. This nurse had to turn the PCA pump off at 0045, put pt on 2L of O2 via [place], and explained to the patient that she was drowsy, not taking deep regular breaths, and sats were low. Patient was okay with the interventions provided. PCA pump will be turned back on once resp and sats are back within her baseline. Will continue to monitor the patient.

## 2020-08-08 NOTE — Clinical Social Work Note (Signed)
CSW contacted by RN who states minor offspring who stayed the night with patient is in need of ride home. Received verbal OK from current [facility] on duty through RN. Ride arranged through [employer].

## 2020-08-08 NOTE — Progress Notes (Signed)
Patient's 34 yo daughter at the bedside upon arrival of night shift. Patient said her daughter has been with her in the ER x 36 hrs and she remained throughout the night due to transportation issues. AC is aware and on-coming shift will be informed. Will continue to monitor the patient.

## 2020-08-08 NOTE — Progress Notes (Signed)
Patient ID: Hannah Hawkins Attia, female   DOB: 03/11/1986, 34 y.o.   MRN: [id number] Subjective: Hannah Hawkins Dax is a 34 year old female with a medical history significant for sickle cell disease, chronic pain syndrome, opiate dependence and tolerance, history of polysubstance abuse, and history of anemia of chronic disease was admitted for sickle cell pain crisis.  Patient was said to be nonadherent with using her oxygen via nasal cannula, and was restless and turning to the extent that she got herself tangled in IV tubing, removed sensor and generally uncooperative.  This morning she claims her pain is still about 8/10, generalized and she claims she still has some pains in her abdomen especially the upper quadrants bilaterally. She denies any fever, cough, chest pain, shortness of breath, nausea, vomiting or diarrhea. All imaging studies were negative for any significant finding.  Objective:  Vital signs in last 24 hours:  Vitals:   08/08/20 0400 08/08/20 0421 08/08/20 0533 08/08/20 0746  BP:   123/89   Pulse:   76   Resp: 19 19 18 15   Temp:   98.3 F (36.8 C)   TempSrc:   Oral   SpO2: 92% 92% 93% 93%  Weight:      Height:       Intake/Output from previous day:   Intake/Output Summary (Last 24 hours) at 08/08/2020 1228 Last data filed at 08/08/2020 0600 Gross per 24 hour  Intake 2082.85 ml  Output --  Net 2082.85 ml   Physical Exam: General: Alert, awake, oriented x3, in no acute distress.  HEENT: [place]/AT PEERL, EOMI Neck: Trachea midline,  no masses, no thyromegal,y no JVD, no carotid bruit OROPHARYNX:  Moist, No exudate/ erythema/lesions.  Heart: Regular rate and rhythm, without murmurs, rubs, gallops, PMI non-displaced, no heaves or thrills on palpation.  Lungs: Clear to auscultation, no wheezing or rhonchi noted. No increased vocal fremitus resonant to percussion  Abdomen: Soft, nontender, nondistended, positive bowel sounds, no masses no hepatosplenomegaly noted..  Neuro: No  focal neurological deficits noted cranial nerves II through XII grossly intact. DTRs 2+ bilaterally upper and lower extremities. Strength 5 out of 5 in bilateral upper and lower extremities. Musculoskeletal: No warm swelling or erythema around joints, no spinal tenderness noted. Psychiatric: Patient alert and oriented x3, good insight and cognition, good recent to remote recall. Lymph node survey: No cervical axillary or inguinal lymphadenopathy noted.  Lab Results:  Basic Metabolic Panel:    Component Value Date/Time   NA 139 08/08/2020 0312   NA 138 03/07/2020 1532   K 2.9 (L) 08/08/2020 0312   CL 105 08/08/2020 0312   CO2 26 08/08/2020 0312   BUN 5 (L) 08/08/2020 0312   BUN 6 03/07/2020 1532   CREATININE 0.67 08/08/2020 0312   CREATININE 0.69 08/04/2017 1208   GLUCOSE 114 (H) 08/08/2020 0312   CALCIUM 8.3 (L) 08/08/2020 0312   CBC:    Component Value Date/Time   WBC 13.5 (H) 08/08/2020 0312   HGB 9.4 (L) 08/08/2020 0312   HGB 9.9 (L) 03/07/2020 1532   HCT 25.8 (L) 08/08/2020 0312   HCT 28.7 (L) 03/07/2020 1532   PLT 267 08/08/2020 0312   PLT 311 03/07/2020 1532   MCV 72.5 (L) 08/08/2020 0312   MCV 89 03/07/2020 1532   NEUTROABS 36.9 (H) 08/06/2020 1247   NEUTROABS 4.7 03/07/2020 1532   LYMPHSABS 0.8 08/06/2020 1247   LYMPHSABS 5.0 (H) 03/07/2020 1532   MONOABS 1.3 (H) 08/06/2020 1247   EOSABS 0.0 08/06/2020 1247  EOSABS 0.7 (H) 03/07/2020 1532   BASOSABS 0.1 08/06/2020 1247   BASOSABS 0.2 03/07/2020 1532    Recent Results (from the past 240 hour(s))  Blood culture (routine x 2)     Status: None (Preliminary result)   Collection Time: 08/06/20  4:40 PM   Specimen: BLOOD  Result Value Ref Range Status   Specimen Description   Final    BLOOD RIGHT ANTECUBITAL Performed at [facility], [address] [place], [place] [address]    Special Requests   Final    BOTTLES DRAWN AEROBIC AND ANAEROBIC Blood Culture results may not be optimal due to  an inadequate volume of blood received in culture bottles Performed at [facility], [address], [place], [place] [address]    Culture   Final    NO GROWTH 2 DAYS Performed at [facility], [address] [place], [place] [address]    Report Status PENDING  Incomplete  Blood culture (routine x 2)     Status: None (Preliminary result)   Collection Time: 08/06/20  5:35 PM   Specimen: BLOOD  Result Value Ref Range Status   Specimen Description   Final    BLOOD RIGHT ANTECUBITAL Performed at [facility], [address], [place], [place] [address]    Special Requests   Final    BOTTLES DRAWN AEROBIC AND ANAEROBIC Blood Culture adequate volume Performed at [facility], [address] [place], [place] [address]    Culture   Final    NO GROWTH 2 DAYS Performed at [facility], [address], [place], [place] [address]    Report Status PENDING  Incomplete  SARS Coronavirus 2 by RT PCR (hospital order, performed in [facility] hospital lab) Nasopharyngeal Nasopharyngeal Swab     Status: None   Collection Time: 08/06/20  7:30 PM   Specimen: Nasopharyngeal Swab  Result Value Ref Range Status   SARS Coronavirus 2 NEGATIVE NEGATIVE Final    Comment: (NOTE) SARS-CoV-2 target nucleic acids are NOT DETECTED.  The SARS-CoV-2 RNA is generally detectable in upper and lower respiratory specimens during the acute phase of infection. The lowest concentration of SARS-CoV-2 viral copies this assay can detect is 250 copies / mL. A negative result does not preclude SARS-CoV-2 infection and should not be used as the sole basis for treatment or other patient management decisions.  A negative result may occur with improper specimen collection / handling, submission of specimen other than nasopharyngeal swab, presence of viral mutation(s) within the areas targeted by this assay, and inadequate number of viral copies (<250 copies  / mL). A negative result must be combined with clinical observations, patient history, and epidemiological information.  Fact Sheet for Patients:   BoilerBrush.com.cy  Fact Sheet for Healthcare Providers: https://pope.com/  This test is not yet approved or  cleared by the [place] FDA and has been authorized for detection and/or diagnosis of SARS-CoV-2 by FDA under an Emergency Use Authorization (EUA).  This EUA will remain in effect (meaning this test can be used) for the duration of the COVID-19 declaration under Section 564(b)(1) of the Act, 21 U.S.C. section 360bbb-3(b)(1), unless the authorization is terminated or revoked sooner.  Performed at [facility], [address] [place], [place] [address]   Urine culture     Status: None   Collection Time: 08/06/20  7:57 PM   Specimen: Urine, Catheterized  Result Value Ref Range Status  Specimen Description   Final    URINE, CATHETERIZED Performed at [facility], [address], [place], [place] [address]    Special Requests   Final    NONE Performed at [facility], [address] [place], [place] [address]    Culture   Final    NO GROWTH Performed at [facility], [address], [place], [place] [address]    Report Status 08/08/2020 FINAL  Final  Respiratory Panel by PCR     Status: None   Collection Time: 08/08/20  2:04 AM   Specimen: Nasopharyngeal Swab; Respiratory  Result Value Ref Range Status   Adenovirus NOT DETECTED NOT DETECTED Final   Coronavirus 229E NOT DETECTED NOT DETECTED Final    Comment: (NOTE) The Coronavirus on the Respiratory Panel, DOES NOT test for the novel  Coronavirus (2019 nCoV)    Coronavirus HKU1 NOT DETECTED NOT DETECTED Final   Coronavirus NL63 NOT DETECTED NOT DETECTED Final   Coronavirus OC43 NOT DETECTED NOT DETECTED Final   Metapneumovirus NOT DETECTED NOT DETECTED  Final   Rhinovirus / Enterovirus NOT DETECTED NOT DETECTED Final   Influenza A NOT DETECTED NOT DETECTED Final   Influenza B NOT DETECTED NOT DETECTED Final   Parainfluenza Virus 1 NOT DETECTED NOT DETECTED Final   Parainfluenza Virus 2 NOT DETECTED NOT DETECTED Final   Parainfluenza Virus 3 NOT DETECTED NOT DETECTED Final   Parainfluenza Virus 4 NOT DETECTED NOT DETECTED Final   Respiratory Syncytial Virus NOT DETECTED NOT DETECTED Final   Bordetella pertussis NOT DETECTED NOT DETECTED Final   Chlamydophila pneumoniae NOT DETECTED NOT DETECTED Final   Mycoplasma pneumoniae NOT DETECTED NOT DETECTED Final    Comment: Performed at [facility], [address] [place], [place] [address]    Studies/Results: DG Chest 1 View  Result Date: 08/06/2020 CLINICAL DATA:  Sickle cell crisis EXAM: CHEST  1 VIEW COMPARISON:  06/15/2020 FINDINGS: The heart size and mediastinal contours are within normal limits. Both lungs are clear. The visualized skeletal structures are unremarkable. IMPRESSION: No active disease. Electronically Signed   By: Duanne Guess D.O.   On: 08/06/2020 16:56   CT ABDOMEN PELVIS W CONTRAST  Result Date: 08/06/2020 CLINICAL DATA:  Nausea and vomiting. EXAM: CT ABDOMEN AND PELVIS WITH CONTRAST TECHNIQUE: Multidetector CT imaging of the abdomen and pelvis was performed using the standard protocol following bolus administration of intravenous contrast. CONTRAST:  OMNIPAQUE IOHEXOL 300 MG/ML  SOLN COMPARISON:  July 13, 2020 FINDINGS: Lower chest: No acute abnormality. Hepatobiliary: No focal liver abnormality is seen. No gallstones, gallbladder wall thickening, or biliary dilatation. Pancreas: Unremarkable. No pancreatic ductal dilatation or surrounding inflammatory changes. Spleen: Multiple cysts are seen within a small spleen. This represents a new finding when compared to the prior study. The largest measures approximately 2.3 cm. Adrenals/Urinary Tract: Adrenal glands  are unremarkable. Kidneys are normal, without renal calculi, focal lesion, or hydronephrosis. Bladder is unremarkable. Stomach/Bowel: Stomach is within normal limits. Appendix appears normal. No evidence of bowel wall thickening, distention, or inflammatory changes. Vascular/Lymphatic: No significant vascular findings are present. No enlarged abdominal or pelvic lymph nodes. Reproductive: Uterus and bilateral adnexa are unremarkable. Other: No abdominal wall hernia or abnormality. No abdominopelvic ascites. Musculoskeletal: No acute or significant osseous findings. IMPRESSION: Multiple cysts within a small spleen. This represents a new finding when compared to the prior study. Electronically Signed   By: Aram Candela M.D.   On: 08/06/2020 17:37  US Abdomen Limited RUQ  Result Date: 08/06/2020 CLINICAL DATA:  Right upper quadrant pain for 2 weeks. EXAM: ULTRASOUND ABDOMEN LIMITED RIGHT UPPER QUADRANT COMPARISON:  CT earlier today. FINDINGS: Gallbladder: Physiologically distended. No gallstones or wall thickening visualized. No sonographic Murphy sign noted by sonographer. Common bile duct: Diameter: 2 mm, normal. Liver: No focal lesion identified. Within normal limits in parenchymal echogenicity. Portal vein is patent on color Doppler imaging with normal direction of blood flow towards the liver. Other: No right upper quadrant ascites. IMPRESSION: Negative right upper quadrant ultrasound. Electronically Signed   By: Narda Rutherford M.D.   On: 08/06/2020 21:17    Medications: Scheduled Meds: . citalopram  20 mg Oral Daily  . enoxaparin (LOVENOX) injection  40 mg Subcutaneous Q24H  . HYDROmorphone   Intravenous Q4H  . ketorolac  15 mg Intravenous Q6H  . montelukast  10 mg Oral QHS  . morphine  30 mg Oral Q12H  . pantoprazole  40 mg Oral Daily  . phosphorus  250 mg Oral Daily  . senna-docusate  1 tablet Oral BID   Continuous Infusions: . diphenhydrAMINE     PRN Meds:.bisacodyl,  diphenhydrAMINE **OR** diphenhydrAMINE, hydrOXYzine, naloxone **AND** sodium chloride flush, ondansetron **OR** ondansetron (ZOFRAN) IV, polyethylene glycol, tiZANidine  Consultants:  None  Procedures:  None  Antibiotics:  IV cefepime/vancomycin/metronidazole x2  Assessment/Plan: Active Problems:   Chronic pain syndrome   History of cocaine use   Non-intractable vomiting   Sickle cell crisis (HCC)   Elevated LFTs   Cyst of spleen   Leukocytosis   SIRS (systemic inflammatory response syndrome) (HCC)   Rhabdomyolysis  1. Rhabdomyolysis: Serum CK is slowly trending down, now 3817 from above 8500 on admission, no evidence of infection or inflammation. Patient is hemodynamically stable. We will continue aggressive hydration. 2. Hb Sickle Cell Disease with crisis: Increase IVF 0.45% Saline to 150 mls/hour, continue weight based Dilaudid PCA, continue IV Toradol 15 mg Q 6 H, Monitor vitals very closely, Re-evaluate pain scale regularly, 2 L of Oxygen by Gray. 3. Leukocytosis: Most likely due to dehydration and reactive to sickle cell pain crisis as well as rhabdomyolysis, it is greatly improved since aggressive hydration, will continue IV fluid and monitor closely. 4. Sickle Cell Anemia: Hemoglobin is stable at baseline, no clinical indication for blood transfusion today. 5. Chronic pain Syndrome: Restart and continue home pain medications. 6. Elevated liver enzymes: Most likely due to rhabdomyolysis, no icterus and no hepatomegaly. Hepatitis panel were negative. We will continue to monitor closely, continue hydration. 7. SIRS - Systemic Inflammatory Response Syndrome: There is no clear source of infection, white cell count and initial elevation of lactic acid likely are related to severe dehydration and rhabdomyolysis, will discontinue all antibiotics and monitor patient closely. Lactic acid is already back to normal.  Code Status: Full Code Family Communication: N/A Disposition Plan: Not  yet ready for discharge  Kary Sugrue  If 7PM-7AM, please contact night-coverage.  08/08/2020, 12:28 PM  LOS: 2 days

## 2020-08-09 DIAGNOSIS — R6510 Systemic inflammatory response syndrome (SIRS) of non-infectious origin without acute organ dysfunction: Secondary | ICD-10-CM

## 2020-08-09 LAB — CBC WITH DIFFERENTIAL/PLATELET
Abs Immature Granulocytes: 0.06 10*3/uL (ref 0.00–0.07)
Basophils Absolute: 0.1 10*3/uL (ref 0.0–0.1)
Basophils Relative: 1 %
Eosinophils Absolute: 1.5 10*3/uL — ABNORMAL HIGH (ref 0.0–0.5)
Eosinophils Relative: 14 %
HCT: 26 % — ABNORMAL LOW (ref 36.0–46.0)
Hemoglobin: 9.8 g/dL — ABNORMAL LOW (ref 12.0–15.0)
Immature Granulocytes: 1 %
Lymphocytes Relative: 35 %
Lymphs Abs: 3.7 10*3/uL (ref 0.7–4.0)
MCH: 26.9 pg (ref 26.0–34.0)
MCHC: 37.7 g/dL — ABNORMAL HIGH (ref 30.0–36.0)
MCV: 71.4 fL — ABNORMAL LOW (ref 80.0–100.0)
Monocytes Absolute: 0.9 10*3/uL (ref 0.1–1.0)
Monocytes Relative: 9 %
Neutro Abs: 4.5 10*3/uL (ref 1.7–7.7)
Neutrophils Relative %: 40 %
Platelets: 259 10*3/uL (ref 150–400)
RBC: 3.64 MIL/uL — ABNORMAL LOW (ref 3.87–5.11)
RDW: 15 % (ref 11.5–15.5)
WBC: 10.7 10*3/uL — ABNORMAL HIGH (ref 4.0–10.5)
nRBC: 6.2 % — ABNORMAL HIGH (ref 0.0–0.2)

## 2020-08-09 LAB — COMPREHENSIVE METABOLIC PANEL
ALT: 245 U/L — ABNORMAL HIGH (ref 0–44)
AST: 160 U/L — ABNORMAL HIGH (ref 15–41)
Albumin: 3.8 g/dL (ref 3.5–5.0)
Alkaline Phosphatase: 47 U/L (ref 38–126)
Anion gap: 9 (ref 5–15)
BUN: <5 mg/dL — ABNORMAL LOW (ref 6–20)
CO2: 29 mmol/L (ref 22–32)
Calcium: 9.1 mg/dL (ref 8.9–10.3)
Chloride: 101 mmol/L (ref 98–111)
Creatinine, Ser: 0.59 mg/dL (ref 0.44–1.00)
GFR calc Af Amer: >60 mL/min (ref >60)
GFR calc non Af Amer: >60 mL/min (ref >60)
Glucose, Bld: 95 mg/dL (ref 70–99)
Potassium: 3.6 mmol/L (ref 3.5–5.1)
Sodium: 139 mmol/L (ref 135–145)
Total Bilirubin: 1.2 mg/dL (ref 0.3–1.2)
Total Protein: 6.7 g/dL (ref 6.5–8.1)

## 2020-08-09 LAB — MAGNESIUM: Magnesium: 1.6 mg/dL — ABNORMAL LOW (ref 1.7–2.4)

## 2020-08-09 LAB — CK: Total CK: 1032 U/L — ABNORMAL HIGH (ref 38–234)

## 2020-08-09 LAB — PHOSPHORUS: Phosphorus: 2.8 mg/dL (ref 2.5–4.6)

## 2020-08-09 MED ORDER — ALUM & MAG HYDROXIDE-SIMETH 200-200-20 MG/5ML PO SUSP
30.0000 mL | ORAL | Status: DC | PRN
  Administered 2020-08-09 – 2020-08-10 (×2): 30 mL via ORAL
  Filled 2020-08-09 (×2): qty 30

## 2020-08-09 NOTE — Progress Notes (Signed)
Patient ID: Hannah Hawkins, female   DOB: 30-May-1986, 34 y.o.   MRN: [id number] Subjective: Hannah Hawkins is a 34 year old female with a medical history significant for sickle cell disease, chronic pain syndrome, opiate dependence and tolerance, history of polysubstance abuse, and history of anemia of chronic disease was admitted for sickle cell pain crisis.  Patient is feeling much better today, pain is about 7/10 characterized as throbbing and achy.  Her pain is now localized to her lower extremities and lower back.  She has no new complaints.  She denies any fever, cough, chest pain, shortness of breath, nausea, vomiting or diarrhea.  She denies any urinary symptoms.  Objective:  Vital signs in last 24 hours:  Vitals:   08/09/20 0118 08/09/20 0436 08/09/20 0642 08/09/20 1039  BP:   (!) 124/102 120/66  Pulse:   92 63  Resp: 13 (!) 8 15 14   Temp:   98.3 F (36.8 C) 98.3 F (36.8 C)  TempSrc:   Oral Oral  SpO2: 98% 96%  100%  Weight:      Height:       Intake/Output from previous day:  No intake or output data in the 24 hours ending 08/09/20 1414 Physical Exam: General: Alert, awake, oriented x3, in no acute distress.  HEENT: [place]/AT PEERL, EOMI Neck: Trachea midline,  no masses, no thyromegal,y no JVD, no carotid bruit OROPHARYNX:  Moist, No exudate/ erythema/lesions.  Heart: Regular rate and rhythm, without murmurs, rubs, gallops, PMI non-displaced, no heaves or thrills on palpation.  Lungs: Clear to auscultation, no wheezing or rhonchi noted. No increased vocal fremitus resonant to percussion  Abdomen: Soft, nontender, nondistended, positive bowel sounds, no masses no hepatosplenomegaly noted..  Neuro: No focal neurological deficits noted cranial nerves II through XII grossly intact. DTRs 2+ bilaterally upper and lower extremities. Strength 5 out of 5 in bilateral upper and lower extremities. Musculoskeletal: No warm swelling or erythema around joints, no spinal tenderness  noted. Psychiatric: Patient alert and oriented x3, good insight and cognition, good recent to remote recall. Lymph node survey: No cervical axillary or inguinal lymphadenopathy noted.  Lab Results:  Basic Metabolic Panel:    Component Value Date/Time   NA 139 08/09/2020 0650   NA 138 03/07/2020 1532   K 3.6 08/09/2020 0650   CL 101 08/09/2020 0650   CO2 29 08/09/2020 0650   BUN <5 (L) 08/09/2020 0650   BUN 6 03/07/2020 1532   CREATININE 0.59 08/09/2020 0650   CREATININE 0.69 08/04/2017 1208   GLUCOSE 95 08/09/2020 0650   CALCIUM 9.1 08/09/2020 0650   CBC:    Component Value Date/Time   WBC 10.7 (H) 08/09/2020 0650   HGB 9.8 (L) 08/09/2020 0650   HGB 9.9 (L) 03/07/2020 1532   HCT 26.0 (L) 08/09/2020 0650   HCT 28.7 (L) 03/07/2020 1532   PLT 259 08/09/2020 0650   PLT 311 03/07/2020 1532   MCV 71.4 (L) 08/09/2020 0650   MCV 89 03/07/2020 1532   NEUTROABS 4.5 08/09/2020 0650   NEUTROABS 4.7 03/07/2020 1532   LYMPHSABS 3.7 08/09/2020 0650   LYMPHSABS 5.0 (H) 03/07/2020 1532   MONOABS 0.9 08/09/2020 0650   EOSABS 1.5 (H) 08/09/2020 0650   EOSABS 0.7 (H) 03/07/2020 1532   BASOSABS 0.1 08/09/2020 0650   BASOSABS 0.2 03/07/2020 1532    Recent Results (from the past 240 hour(s))  Blood culture (routine x 2)     Status: None (Preliminary result)   Collection Time: 08/06/20  4:40  PM   Specimen: BLOOD  Result Value Ref Range Status   Specimen Description   Final    BLOOD RIGHT ANTECUBITAL Performed at [facility], [address] [place], [place] [address]    Special Requests   Final    BOTTLES DRAWN AEROBIC AND ANAEROBIC Blood Culture results may not be optimal due to an inadequate volume of blood received in culture bottles Performed at [facility], [address], [place], [place] [address]    Culture   Final    NO GROWTH 2 DAYS Performed at [facility], [address] [place], [place] [address]    Report Status  PENDING  Incomplete  Blood culture (routine x 2)     Status: None (Preliminary result)   Collection Time: 08/06/20  5:35 PM   Specimen: BLOOD  Result Value Ref Range Status   Specimen Description   Final    BLOOD RIGHT ANTECUBITAL Performed at [facility], [address] [place], [place] [address]    Special Requests   Final    BOTTLES DRAWN AEROBIC AND ANAEROBIC Blood Culture adequate volume Performed at [facility], [address] [place], [place] [address]    Culture   Final    NO GROWTH 2 DAYS Performed at [facility], [address], [place], [place] [address]    Report Status PENDING  Incomplete  SARS Coronavirus 2 by RT PCR (hospital order, performed in [facility] hospital lab) Nasopharyngeal Nasopharyngeal Swab     Status: None   Collection Time: 08/06/20  7:30 PM   Specimen: Nasopharyngeal Swab  Result Value Ref Range Status   SARS Coronavirus 2 NEGATIVE NEGATIVE Final    Comment: (NOTE) SARS-CoV-2 target nucleic acids are NOT DETECTED.  The SARS-CoV-2 RNA is generally detectable in upper and lower respiratory specimens during the acute phase of infection. The lowest concentration of SARS-CoV-2 viral copies this assay can detect is 250 copies / mL. A negative result does not preclude SARS-CoV-2 infection and should not be used as the sole basis for treatment or other patient management decisions.  A negative result may occur with improper specimen collection / handling, submission of specimen other than nasopharyngeal swab, presence of viral mutation(s) within the areas targeted by this assay, and inadequate number of viral copies (<250 copies / mL). A negative result must be combined with clinical observations, patient history, and epidemiological information.  Fact Sheet for Patients:   BoilerBrush.com.cy  Fact Sheet for Healthcare Providers: https://pope.com/  This  test is not yet approved or  cleared by the [place] FDA and has been authorized for detection and/or diagnosis of SARS-CoV-2 by FDA under an Emergency Use Authorization (EUA).  This EUA will remain in effect (meaning this test can be used) for the duration of the COVID-19 declaration under Section 564(b)(1) of the Act, 21 U.S.C. section 360bbb-3(b)(1), unless the authorization is terminated or revoked sooner.  Performed at [facility], [address], [place], [place] [address]   Urine culture     Status: None   Collection Time: 08/06/20  7:57 PM   Specimen: Urine, Catheterized  Result Value Ref Range Status   Specimen Description   Final    URINE, CATHETERIZED Performed at [facility], [address] [place], [place] [address]    Special Requests   Final    NONE Performed at [facility], [address] [place],  [place] [id number]    Culture   Final    NO GROWTH Performed at [facility], [address] [place], [place] [address]    Report Status 08/08/2020 FINAL  Final  Respiratory Panel by PCR     Status: None   Collection Time: 08/08/20  2:04 AM   Specimen: Nasopharyngeal Swab; Respiratory  Result Value Ref Range Status   Adenovirus NOT DETECTED NOT DETECTED Final   Coronavirus 229E NOT DETECTED NOT DETECTED Final    Comment: (NOTE) The Coronavirus on the Respiratory Panel, DOES NOT test for the novel  Coronavirus (2019 nCoV)    Coronavirus HKU1 NOT DETECTED NOT DETECTED Final   Coronavirus NL63 NOT DETECTED NOT DETECTED Final   Coronavirus OC43 NOT DETECTED NOT DETECTED Final   Metapneumovirus NOT DETECTED NOT DETECTED Final   Rhinovirus / Enterovirus NOT DETECTED NOT DETECTED Final   Influenza A NOT DETECTED NOT DETECTED Final   Influenza B NOT DETECTED NOT DETECTED Final   Parainfluenza Virus 1 NOT DETECTED NOT DETECTED Final   Parainfluenza Virus 2 NOT DETECTED NOT DETECTED Final    Parainfluenza Virus 3 NOT DETECTED NOT DETECTED Final   Parainfluenza Virus 4 NOT DETECTED NOT DETECTED Final   Respiratory Syncytial Virus NOT DETECTED NOT DETECTED Final   Bordetella pertussis NOT DETECTED NOT DETECTED Final   Chlamydophila pneumoniae NOT DETECTED NOT DETECTED Final   Mycoplasma pneumoniae NOT DETECTED NOT DETECTED Final    Comment: Performed at [facility], [address] [place], [place] [address]    Studies/Results: No results found.  Medications: Scheduled Meds: . citalopram  20 mg Oral Daily  . enoxaparin (LOVENOX) injection  40 mg Subcutaneous Q24H  . HYDROmorphone   Intravenous Q4H  . ketorolac  15 mg Intravenous Q6H  . montelukast  10 mg Oral QHS  . morphine  30 mg Oral Q12H  . pantoprazole  40 mg Oral Daily  . phosphorus  250 mg Oral Daily  . senna-docusate  1 tablet Oral BID   Continuous Infusions: . diphenhydrAMINE     PRN Meds:.bisacodyl, diphenhydrAMINE **OR** diphenhydrAMINE, hydrOXYzine, naloxone **AND** sodium chloride flush, ondansetron **OR** ondansetron (ZOFRAN) IV, polyethylene glycol, tiZANidine  Consultants:  None  Procedures:  None  Antibiotics:  Discontinued IV cefepime/vancomycin/metronidazole x2  Assessment/Plan: Active Problems:   Chronic pain syndrome   History of cocaine use   Non-intractable vomiting   Sickle cell crisis (HCC)   Elevated LFTs   Cyst of spleen   Leukocytosis   SIRS (systemic inflammatory response syndrome) (HCC)   Rhabdomyolysis   Epigastric pain   Lactic acidosis   RUQ abdominal pain  1. Rhabdomyolysis: Serum CK is slowly trending down, now 1032 from about 8500 on admission, no evidence of infection or inflammation. Patient is hemodynamically stable. We will start to reduce IV hydration rate. 2. Hb Sickle Cell Disease with crisis: Reduce IVF 0.45% Saline to 50 mls/hour, begin to wean weight based Dilaudid PCA, continue IV Toradol 15 mg Q 6 H, continue oral home pain medications, monitor  vitals very closely, Re-evaluate pain scale regularly, 2 L of Oxygen by [place]. 3. Leukocytosis: Significantly improved from above 30 now to 10.7.  Most likely due to dehydration and reactive to sickle cell pain crisis as well as rhabdomyolysis, it is greatly improved since aggressive hydration. 4. Sickle Cell Anemia: Hemoglobin continues to be stable at baseline, no clinical indication for blood transfusion today.  Repeat CBC in a.m. 5. Chronic pain Syndrome: Continue home pain medications.  6. Elevated liver enzymes: Improving, most likely due to rhabdomyolysis, no icterus and no hepatomegaly. Hepatitis panel were negative. We will continue to monitor closely, continue hydration. 7. SIRS - Systemic Inflammatory Response Syndrome: Resolved.  We will continue to monitor patient closely without antibiotics. Patient is hemodynamically stable.  Code Status: Full Code Family Communication: N/A Disposition Plan: Not yet ready for discharge  Hannah Hawkins  If 7PM-7AM, please contact night-coverage.  08/09/2020, 2:14 PM  LOS: 3 days

## 2020-08-10 ENCOUNTER — Other Ambulatory Visit: Payer: Self-pay | Admitting: Family Medicine

## 2020-08-10 ENCOUNTER — Other Ambulatory Visit: Payer: Self-pay | Admitting: Internal Medicine

## 2020-08-10 ENCOUNTER — Telehealth: Payer: Self-pay | Admitting: Family Medicine

## 2020-08-10 ENCOUNTER — Encounter: Payer: Self-pay | Admitting: Family Medicine

## 2020-08-10 ENCOUNTER — Encounter (HOSPITAL_COMMUNITY): Payer: Self-pay | Admitting: Internal Medicine

## 2020-08-10 DIAGNOSIS — D5720 Sickle-cell/Hb-C disease without crisis: Secondary | ICD-10-CM

## 2020-08-10 DIAGNOSIS — R110 Nausea: Secondary | ICD-10-CM

## 2020-08-10 DIAGNOSIS — G894 Chronic pain syndrome: Secondary | ICD-10-CM

## 2020-08-10 LAB — COMPREHENSIVE METABOLIC PANEL
ALT: 177 U/L — ABNORMAL HIGH (ref 0–44)
AST: 85 U/L — ABNORMAL HIGH (ref 15–41)
Albumin: 4 g/dL (ref 3.5–5.0)
Alkaline Phosphatase: 49 U/L (ref 38–126)
Anion gap: 9 (ref 5–15)
BUN: 5 mg/dL — ABNORMAL LOW (ref 6–20)
CO2: 31 mmol/L (ref 22–32)
Calcium: 8.8 mg/dL — ABNORMAL LOW (ref 8.9–10.3)
Chloride: 100 mmol/L (ref 98–111)
Creatinine, Ser: 0.53 mg/dL (ref 0.44–1.00)
GFR calc Af Amer: >60 mL/min (ref >60)
GFR calc non Af Amer: >60 mL/min (ref >60)
Glucose, Bld: 100 mg/dL — ABNORMAL HIGH (ref 70–99)
Potassium: 3.1 mmol/L — ABNORMAL LOW (ref 3.5–5.1)
Sodium: 140 mmol/L (ref 135–145)
Total Bilirubin: 0.9 mg/dL (ref 0.3–1.2)
Total Protein: 6.9 g/dL (ref 6.5–8.1)

## 2020-08-10 LAB — CK: Total CK: 643 U/L — ABNORMAL HIGH (ref 38–234)

## 2020-08-10 MED ORDER — MORPHINE SULFATE ER 30 MG PO TBCR
30.0000 mg | EXTENDED_RELEASE_TABLET | Freq: Two times a day (BID) | ORAL | 0 refills | Status: AC
Start: 2020-08-10 — End: 2020-08-25

## 2020-08-10 MED ORDER — PROMETHAZINE HCL 12.5 MG PO TABS: 12.5000 mg | ORAL_TABLET | ORAL | 0 refills | Status: DC | PRN

## 2020-08-10 MED ORDER — K PHOS MONO-SOD PHOS DI & MONO 155-852-130 MG PO TABS: 250.0000 mg | ORAL_TABLET | Freq: Every day | ORAL | 0 refills | Status: AC

## 2020-08-10 MED ORDER — OXYCODONE HCL 15 MG PO TABS: 15.0000 mg | ORAL_TABLET | ORAL | 0 refills | Status: DC | PRN

## 2020-08-10 NOTE — Discharge Summary (Signed)
Physician Discharge Summary  Hannah Hawkins ZOX:[id number] DOB: 1986/07/10 DOA: 08/06/2020  PCP: Vevelyn Francois, NP  Admit date: 08/06/2020  Discharge date: 08/10/2020  Discharge Diagnoses:  Active Problems:   Chronic pain syndrome   History of cocaine use   Non-intractable vomiting   Sickle cell crisis (HCC)   Elevated LFTs   Cyst of spleen   Leukocytosis   SIRS (systemic inflammatory response syndrome) (HCC)   Rhabdomyolysis   Epigastric pain   Lactic acidosis   RUQ abdominal pain   Discharge Condition: Stable  Disposition:   Follow-up Information    Vevelyn Francois, NP. Go in 3 day(s).   Specialty: Adult Health Nurse Practitioner Why: For [facility] F/U and to Check LFT, BMP and CK Contact information: [address] [place] [address] [phone]              Pt is discharged home in good condition and is to follow up with Vevelyn Francois, NP this week to have labs evaluated. Hannah Hawkins is instructed to increase activity slowly and balance with rest for the next few days, and use prescribed medication to complete treatment of pain  Diet: Regular Wt Readings from Last 3 Encounters:  08/08/20 67.1 kg  07/24/20 66.2 kg  07/24/20 65.2 kg    History of present illness:  Hannah Hawkins is a 34 y.o. female with medical history significant of sickle cell disease, cocaine abuse,  Presented with abdominal pain nausea vomiting chills stuffy nose and generalized body aches. She reports her pain similar to her sickle cell pain crisis. No chest pain or shortness of breath no leg swelling Infectious risk factors:  Reports  URI symptoms, N/V/Diarrhea/abdominal pain, Body aches, severe fatigue. Has  NOt been vaccinated against COVID    Initial COVID TEST   in house  PCR testing  Negative  Recent Labs       Lab Results  Component Value Date   Lupus NEGATIVE 08/06/2020   [place] NEGATIVE 07/13/2020   [place] NEGATIVE 06/11/2020    [place] NEGATIVE 05/14/2020      Regarding pertinent Chronic problems:       Chronic anemia - baseline hg Hemoglobin & Hematocrit  Recent Labs (within last 365 days)       Recent Labs    07/17/20 0547 07/25/20 0221 08/06/20 1247  HGB 11.2* 11.9* 11.7*       While in ER: Noted to have lactic acid elevated up to 4 CT unremarkable Except for spleen cysts And white blood cell count elevated in the 30s. Started on vancomycin, cefepime and Flagyl and Flagyl ER provider Also noted elevated LFTs Procalcitonin elevated 2.48 Blood cultures were obtained Right upper quadrant ultrasound unremarkable  Hospitalist was called for admission for sickle cell pain crisis   Hospital Course:  Patient was admitted for sickle cell pain crisis, rhabdomyolysis with elevated liver enzymes, leukocytosis and systemic inflammatory response syndrome (SIRS) and managed appropriately with IVF, IV Dilaudid via PCA and IV Toradol, as well as other adjunct therapies per sickle cell pain management protocols. She was also started on IV broad-spectrum antibiotics initially with vancomycin, cefepime and metronidazole which were later discontinued because of no evidence or source of infection or inflammation.  Symptoms were most likely due to dehydration for which patient received generous rehydration with significant improvement. Her CK level slowly trended down as well as LFTs. Lactic acid returned to normal almost immediately.  Electrolyte imbalance or repleted with great result. Pain slowly returned to within  baseline.  Fortunately hemoglobin remained stable throughout this admission, patient did not required any blood transfusion.  Hepatitis panel while negative, Covid test was negative. CK came down from a high of 8,539 to 643 and white cell count came down from above 30,002 below 11,000 before discharge. Patient was hemodynamically stable throughout this admission. As at today, patient is clinically stable,  vital signs were within normal limit, eating well with no nausea or vomiting or diarrhea, ambulating well with no significant pain.  All laboratory findings were pointing towards normal range. Patient was therefore discharged home today in a hemodynamically stable condition.   Hannah Hawkins will follow-up with PCP within 1 week of this discharge. Hannah Hawkins was counseled extensively about nonpharmacologic means of pain management, patient verbalized understanding and was appreciative of  the care received during this admission.   We discussed the need for good hydration, monitoring of hydration status, avoidance of heat, cold, stress, and infection triggers. We discussed the need to be adherent with taking Hydrea and other home medications. Patient was reminded of the need to seek medical attention immediately if any symptom of bleeding, anemia, or infection occurs.  Discharge Exam: Vitals:   08/10/20 0627 08/10/20 0735  BP: 127/66   Pulse: 74   Resp: 12 16  Temp: 97.8 F (36.6 C)   SpO2: 99% 99%   Vitals:   08/10/20 0105 08/10/20 0412 08/10/20 0627 08/10/20 0735  BP: 128/79  127/66   Pulse: 65  74   Resp: '11 11 12 16  ' Temp: 98.7 F (37.1 C)  97.8 F (36.6 C)   TempSrc: Oral  Oral   SpO2: 100% 98% 99% 99%  Weight:      Height:       General appearance : Awake, alert, not in any distress. Speech Clear. Not toxic looking HEENT: Atraumatic and Normocephalic, pupils equally reactive to light and accomodation Neck: Supple, no JVD. No cervical lymphadenopathy.  Chest: Good air entry bilaterally, no added sounds  CVS: S1 S2 regular, no murmurs.  Abdomen: Bowel sounds present, Non tender and not distended with no gaurding, rigidity or rebound. Extremities: B/L Lower Ext shows no edema, both legs are warm to touch Neurology: Awake alert, and oriented X 3, CN II-XII intact, Non focal Skin: No Rash  Discharge Instructions  Discharge Instructions    Diet - low sodium heart healthy   Complete  by: As directed    Increase activity slowly   Complete by: As directed      Allergies as of 08/10/2020      Reactions   Penicillins Swelling, Rash   Has patient had a PCN reaction causing immediate rash, facial/tongue/throat swelling, SOB or lightheadedness with hypotension: Yes Has patient had a PCN reaction causing severe rash involving mucus membranes or skin necrosis: Yes Has patient had a PCN reaction that required hospitalization No Has patient had a PCN reaction occurring within the last 10 years: no If all of the above answers are "NO", then may proceed with Cephalosporin use.      Medication List    TAKE these medications   albuterol 108 (90 Base) MCG/ACT inhaler Commonly known as: VENTOLIN HFA Inhale 2 puffs into the lungs every 6 (six) hours as needed for wheezing or shortness of breath.   citalopram 10 MG tablet Commonly known as: CELEXA TAKE 1 TABLET(10 MG) BY MOUTH DAILY FOR 14 DAYS THEN TAKE 2 TABLETS(20 MG) BY MOUTH DAILY What changed: See the new instructions.   ergocalciferol 1.25 MG (50000  UT) capsule Commonly known as: VITAMIN D2 Take 1 capsule (50,000 Units total) by mouth once a week.   GERITOL PO Take 1 tablet by mouth daily.   ibuprofen 800 MG tablet Commonly known as: ADVIL Take 1 tablet (800 mg total) by mouth every 8 (eight) hours as needed for moderate pain. Take on full stomach.   levocetirizine 5 MG tablet Commonly known as: XYZAL Take 5 mg by mouth every evening.   lidocaine 5 % Commonly known as: Lidoderm Place 1 patch onto the skin daily. Remove & Discard patch within 12 hours or as directed by MD What changed:   when to take this  reasons to take this   montelukast 10 MG tablet Commonly known as: SINGULAIR Take 1 tablet (10 mg total) by mouth at bedtime.   morphine 30 MG 12 hr tablet Commonly known as: MS CONTIN Take 1 tablet (30 mg total) by mouth every 12 (twelve) hours for 15 days. What changed:   when to take  this  reasons to take this   Narcan 4 MG/0.1ML Liqd nasal spray kit Generic drug: naloxone Place 4 mg into the nose once as needed (overdose).   ondansetron 4 MG tablet Commonly known as: ZOFRAN Take 4 mg by mouth every 8 (eight) hours as needed for nausea or vomiting.   oxyCODONE 15 MG immediate release tablet Commonly known as: ROXICODONE Take 1 tablet (15 mg total) by mouth every 4 (four) hours as needed for up to 15 days for pain (moderate to severe pain).   pantoprazole 40 MG tablet Commonly known as: PROTONIX Take 1 tablet (40 mg total) by mouth daily.   phosphorus 155-852-130 MG tablet Commonly known as: K PHOS NEUTRAL Take 1 tablet (250 mg total) by mouth daily for 10 days. Start taking on: August 11, 2020   promethazine 12.5 MG tablet Commonly known as: PHENERGAN Take 1 tablet (12.5 mg total) by mouth every 4 (four) hours as needed for nausea or vomiting.   sucralfate 1 GM/10ML suspension Commonly known as: Carafate Take 10 mLs (1 g total) by mouth 4 (four) times daily for 21 days. What changed:   when to take this  reasons to take this  additional instructions   tiZANidine 2 MG tablet Commonly known as: ZANAFLEX TAKE 1 TABLET(2 MG) BY MOUTH EVERY 8 HOURS AS NEEDED FOR MUSCLE SPASMS What changed: See the new instructions.       The results of significant diagnostics from this hospitalization (including imaging, microbiology, ancillary and laboratory) are listed below for reference.    Significant Diagnostic Studies: DG Chest 1 View  Result Date: 08/06/2020 CLINICAL DATA:  Sickle cell crisis EXAM: CHEST  1 VIEW COMPARISON:  06/15/2020 FINDINGS: The heart size and mediastinal contours are within normal limits. Both lungs are clear. The visualized skeletal structures are unremarkable. IMPRESSION: No active disease. Electronically Signed   By: Davina Poke D.O.   On: 08/06/2020 16:56   CT ABDOMEN PELVIS W CONTRAST  Result Date: 08/06/2020 CLINICAL  DATA:  Nausea and vomiting. EXAM: CT ABDOMEN AND PELVIS WITH CONTRAST TECHNIQUE: Multidetector CT imaging of the abdomen and pelvis was performed using the standard protocol following bolus administration of intravenous contrast. CONTRAST:  122m OMNIPAQUE IOHEXOL 300 MG/ML  SOLN COMPARISON:  July 13, 2020 FINDINGS: Lower chest: No acute abnormality. Hepatobiliary: No focal liver abnormality is seen. No gallstones, gallbladder wall thickening, or biliary dilatation. Pancreas: Unremarkable. No pancreatic ductal dilatation or surrounding inflammatory changes. Spleen: Multiple cysts are seen within  a small spleen. This represents a new finding when compared to the prior study. The largest measures approximately 2.3 cm. Adrenals/Urinary Tract: Adrenal glands are unremarkable. Kidneys are normal, without renal calculi, focal lesion, or hydronephrosis. Bladder is unremarkable. Stomach/Bowel: Stomach is within normal limits. Appendix appears normal. No evidence of bowel wall thickening, distention, or inflammatory changes. Vascular/Lymphatic: No significant vascular findings are present. No enlarged abdominal or pelvic lymph nodes. Reproductive: Uterus and bilateral adnexa are unremarkable. Other: No abdominal wall hernia or abnormality. No abdominopelvic ascites. Musculoskeletal: No acute or significant osseous findings. IMPRESSION: Multiple cysts within a small spleen. This represents a new finding when compared to the prior study. Electronically Signed   By: Virgina Norfolk M.D.   On: 08/06/2020 17:37   CT ABDOMEN PELVIS W CONTRAST  Result Date: 07/13/2020 CLINICAL DATA:  Nausea, vomiting, abdominal pain, sickle cell disease EXAM: CT ABDOMEN AND PELVIS WITH CONTRAST TECHNIQUE: Multidetector CT imaging of the abdomen and pelvis was performed using the standard protocol following bolus administration of intravenous contrast. CONTRAST:  180m OMNIPAQUE IOHEXOL 300 MG/ML  SOLN COMPARISON:  None. FINDINGS: Lower chest:  The visualized lung bases are clear bilaterally. The visualized heart and pericardium are unremarkable. Hepatobiliary: Liver unremarkable. No intra or extrahepatic biliary ductal dilation. Gallbladder unremarkable. Pancreas: Pancreas unremarkable. 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 No acute infarction identified. Adrenals/Urinary Tract: Adrenal glands are unremarkable. Kidneys are unremarkable. Bladder is unremarkable. Stomach/Bowel: The stomach, small bowel, and large bowel are unremarkable. Appendix normal. No free intraperitoneal gas or fluid. Vascular/Lymphatic: The abdominal vasculature is normal. No pathologic adenopathy within the abdomen and pelvis. Reproductive: Focal hypodensity within the posterior fundal myometrium may represent a small fibroid. The pelvic organs are otherwise unremarkable. Other: Rectum unremarkable. Musculoskeletal: No acute bone abnormality. The osseous structures are unremarkable. IMPRESSION: Changes within the spleen in keeping with progressive are low infarction related to the patient's given history of sickle cell disease. No acute infarct identified. Otherwise normal examination. Electronically Signed   By: AFidela SalisburyMD   On: 07/13/2020 22:08   UKoreaAbdomen Limited RUQ  Result Date: 08/06/2020 CLINICAL DATA:  Right upper quadrant pain for 2 weeks. EXAM: ULTRASOUND ABDOMEN LIMITED RIGHT UPPER QUADRANT COMPARISON:  CT earlier today. FINDINGS: Gallbladder: Physiologically distended. No gallstones or wall thickening visualized. No sonographic Murphy sign noted by sonographer. Common bile duct: Diameter: 2 mm, normal. Liver: No focal lesion identified. Within normal limits in parenchymal echogenicity. Portal vein is patent on color Doppler imaging with normal direction of blood flow towards the liver. Other: No right upper quadrant ascites. IMPRESSION: Negative right upper  quadrant ultrasound. Electronically Signed   By: MKeith RakeM.D.   On: 08/06/2020 21:17    Microbiology: Recent Results (from the past 240 hour(s))  Blood culture (routine x 2)     Status: None (Preliminary result)   Collection Time: 08/06/20  4:40 PM   Specimen: BLOOD  Result Value Ref Range Status   Specimen Description   Final    BLOOD RIGHT ANTECUBITAL Performed at [facility] [place] [address]   Special Requests   Final    BOTTLES DRAWN AEROBIC AND ANAEROBIC Blood Culture results may not be optimal due to an inadequate volume of blood received in culture bottles Performed at [facility], [place] [id number]   Culture   Final  NO GROWTH 4 DAYS Performed at [facility], [place] [address] [place], [place] [address]    Report Status PENDING  Incomplete  Blood culture (routine x 2)     Status: None (Preliminary result)   Collection Time: 08/06/20  5:35 PM   Specimen: BLOOD  Result Value Ref Range Status   Specimen Description   Final    BLOOD RIGHT ANTECUBITAL Performed at [facility] [address] [place], [place] [address]    Special Requests   Final    BOTTLES DRAWN AEROBIC AND ANAEROBIC Blood Culture adequate volume Performed at [facility] [address], [place], [place] [address]    Culture   Final    NO GROWTH 4 DAYS Performed at [facility], [place] [address], [place], [place] [address]    Report Status PENDING  Incomplete  SARS Coronavirus 2 by RT PCR (hospital order, performed in [facility] hospital lab) Nasopharyngeal Nasopharyngeal Swab     Status: None   Collection Time: 08/06/20  7:30 PM   Specimen: Nasopharyngeal Swab  Result Value Ref Range Status   SARS Coronavirus 2 NEGATIVE NEGATIVE Final    Comment: (NOTE) SARS-CoV-2 target nucleic acids are NOT DETECTED.  The SARS-CoV-2 RNA is generally detectable  in upper and lower respiratory specimens during the acute phase of infection. The lowest concentration of SARS-CoV-2 viral copies this assay can detect is 250 copies / mL. A negative result does not preclude SARS-CoV-2 infection and should not be used as the sole basis for treatment or other patient management decisions.  A negative result may occur with improper specimen collection / handling, submission of specimen other than nasopharyngeal swab, presence of viral mutation(s) within the areas targeted by this assay, and inadequate number of viral copies (<250 copies / mL). A negative result must be combined with clinical observations, patient history, and epidemiological information.  Fact Sheet for Patients:   StrictlyIdeas.no  Fact Sheet for Healthcare Providers: BankingDealers.co.za  This test is not yet approved or  cleared by the [place] FDA and has been authorized for detection and/or diagnosis of SARS-CoV-2 by FDA under an Emergency Use Authorization (EUA).  This EUA will remain in effect (meaning this test can be used) for the duration of the COVID-19 declaration under Section 564(b)(1) of the Act, 21 U.S.C. section 360bbb-3(b)(1), unless the authorization is terminated or revoked sooner.  Performed at [facility], [place] [address], [place], [place] [address]   Urine culture     Status: None   Collection Time: 08/06/20  7:57 PM   Specimen: Urine, Catheterized  Result Value Ref Range Status   Specimen Description   Final    URINE, CATHETERIZED Performed at [place] [address], [place], [place] [address]    Special Requests   Final    NONE Performed at [facility] [place] [address], [place], [place] [address]    Culture   Final    NO GROWTH Performed at [facility], [place] [address], [place],  [address]    Report Status 08/08/2020 FINAL   Final  Respiratory Panel by PCR     Status: None   Collection Time: 08/08/20  2:04 AM   Specimen: Nasopharyngeal Swab; Respiratory  Result Value Ref Range Status   Adenovirus NOT DETECTED NOT DETECTED Final   Coronavirus 229E NOT DETECTED NOT DETECTED Final    Comment: (NOTE) The Coronavirus on the Respiratory  Panel, DOES NOT test for the novel  Coronavirus (2019 nCoV)    Coronavirus HKU1 NOT DETECTED NOT DETECTED Final   Coronavirus NL63 NOT DETECTED NOT DETECTED Final   Coronavirus OC43 NOT DETECTED NOT DETECTED Final   Metapneumovirus NOT DETECTED NOT DETECTED Final   Rhinovirus / Enterovirus NOT DETECTED NOT DETECTED Final   Influenza A NOT DETECTED NOT DETECTED Final   Influenza B NOT DETECTED NOT DETECTED Final   Parainfluenza Virus 1 NOT DETECTED NOT DETECTED Final   Parainfluenza Virus 2 NOT DETECTED NOT DETECTED Final   Parainfluenza Virus 3 NOT DETECTED NOT DETECTED Final   Parainfluenza Virus 4 NOT DETECTED NOT DETECTED Final   Respiratory Syncytial Virus NOT DETECTED NOT DETECTED Final   Bordetella pertussis NOT DETECTED NOT DETECTED Final   Chlamydophila pneumoniae NOT DETECTED NOT DETECTED Final   Mycoplasma pneumoniae NOT DETECTED NOT DETECTED Final    Comment: Performed at [facility], [place] [address], [place], [place] [address]     Labs: Basic Metabolic Panel: Recent Labs  Lab 08/06/20 1247 08/06/20 1247 08/06/20 2030 08/07/20 0618 08/08/20 0312 08/08/20 1354 08/09/20 0650 08/10/20 0530  NA 140  --   --   --  139  --  139 140  K 3.9   < >  --   --  2.9*   < > 3.6 3.1*  CL 105  --   --   --  105  --  101 100  CO2 23  --   --   --  26  --  29 31  GLUCOSE 108*  --   --   --  114*  --  95 100*  BUN 15  --   --   --  5*  --  <5* 5*  CREATININE 1.00  --   --   --  0.67  --  0.59 0.53  CALCIUM 8.7*  --   --   --  8.3*  --  9.1 8.8*  MG  --   --  1.9 1.6*  --   --  1.6*  --   PHOS  --   --   --  2.0*  --   --  2.8  --    < > = values in this interval  not displayed.   Liver Function Tests: Recent Labs  Lab 08/06/20 1247 08/08/20 0312 08/09/20 0650 08/10/20 0530  AST 198* 381* 160* 85*  ALT 100* 329* 245* 177*  ALKPHOS 58 43 47 49  BILITOT 1.6* 1.0 1.2 0.9  PROT 8.0 6.2* 6.7 6.9  ALBUMIN 4.9 3.7 3.8 4.0   Recent Labs  Lab 08/06/20 2035  LIPASE 87*   No results for input(s): AMMONIA in the last 168 hours. CBC: Recent Labs  Lab 08/06/20 1247 08/08/20 0312 08/09/20 0650  WBC 39.4* 13.5* 10.7*  NEUTROABS 36.9*  --  4.5  HGB 11.7* 9.4* 9.8*  HCT 31.4* 25.8* 26.0*  MCV 72.5* 72.5* 71.4*  PLT 283 267 259   Cardiac Enzymes: Recent Labs  Lab 08/07/20 0618 08/07/20 1752 08/08/20 0312 08/09/20 1045 08/10/20 0530  CKTOTAL 8,539* 5,784* 3,817* 1,032* 643*   BNP: Invalid input(s): POCBNP CBG: No results for input(s): GLUCAP in the last 168 hours.  Time coordinating discharge: 50 minutes  Signed:  [facility] 08/10/2020, 11:13 AM

## 2020-08-10 NOTE — Telephone Encounter (Signed)
Patient recently discharged from hospital and calls on-call service with need of prior authorization for new Rxs. She also has c/o increased nausea after discharge. She states that Ondansetron is not effective in relieving nausea. New Rx for Promethazine sent to pharmacy today. CMA to complete prior authorization tomorrow and contact patient afterwards.  Verbalized understanding.

## 2020-08-10 NOTE — Discharge Instructions (Signed)
Flank Pain, Adult Flank pain is pain in your side. The flank is the area of your side between your upper belly (abdomen) and your back. The pain may occur over a short time (acute), or it may be long-term or come back often (chronic). It may be mild or very bad. Pain in this area can be caused by many different things. Follow these instructions at home:   Drink enough fluid to keep your pee (urine) clear or pale yellow.  Rest as told by your doctor.  Take over-the-counter and prescription medicines only as told by your doctor.  Keep a journal to keep track of: ? What has caused your flank pain. ? What has made it feel better.  Keep all follow-up visits as told by your doctor. This is important. Contact a doctor if:  Medicine does not help your pain.  You have new symptoms.  Your pain gets worse.  You have a fever.  Your symptoms last longer than 2-3 days.  You have trouble peeing.  You are peeing more often than normal. Get help right away if:  You have trouble breathing.  You are short of breath.  Your belly hurts, or it is swollen or red.  You feel sick to your stomach (nauseous).  You throw up (vomit).  You feel like you will pass out, or you do pass out (faint).  You have blood in your pee. Summary  Flank pain is pain in your side. The flank is the area of your side between your upper belly (abdomen) and your back.  Flank pain may occur over a short time (acute), or it may be long-term or come back often (chronic). It may be mild or very bad.  Pain in this area can be caused by many different things.  Contact your doctor if your symptoms get worse or they last longer than 2-3 days. This information is not intended to replace advice given to you by your health care provider. Make sure you discuss any questions you have with your health care provider. Document Revised: 11/18/2017 Document Reviewed: 03/28/2017 Elsevier Patient Education  2020 [employer]  Rhabdomyolysis Rhabdomyolysis is a condition that happens when muscle cells break down and release substances into the blood that can damage the kidneys. This condition happens because of damage to the muscles that move bones (skeletal muscle). When the skeletal muscles are damaged, substances inside the muscle cells go into the blood. One of these substances is a protein called myoglobin. Large amounts of myoglobin can cause kidney damage or kidney failure. Other substances that are released by muscle cells may upset the balance of the minerals (electrolytes) in your blood. This imbalance causes your blood to have too much acid (acidosis). What are the causes? This condition is caused by muscle damage. Muscle damage often happens because of:  Using your muscles too much.  An injury that crushes or squeezes a muscle too tightly.  Using illegal drugs, mainly cocaine.  Alcohol abuse. Other possible causes include:  Prescription medicines, such as those that: ? Lower cholesterol (statins). ? Treat ADHD (attention deficit hyperactivity disorder) or help with weight loss (amphetamines). ? Treat pain (opiates).  Infections.  Muscle diseases that are passed down from parent to child (inherited).  High fever.  Heatstroke.  Not having enough fluids in your body (dehydration).  Seizures.  Surgery. What increases the risk? This condition is more likely to develop in people who:  Have a family history of muscle disease.  Take  part in extreme sports, such as running in marathons.  Have diabetes.  Are older.  Abuse drugs or alcohol. What are the signs or symptoms? Symptoms of this condition vary. Some people have very few symptoms, and other people have many symptoms. The most common symptoms include:  Muscle pain and swelling.  Weak muscles.  Dark urine.  Feeling weak and tired. Other symptoms include:  Nausea and vomiting.  Fever.  Pain in the abdomen.  Pain  in the joints. Symptoms of complications from this condition include:  Heart rhythm that is not normal (arrhythmia).  Seizures.  Not urinating enough because of kidney failure.  Very low blood pressure (shock). Signs of shock include dizziness, blurry vision, and clammy skin.  Bleeding that is hard to stop or control. How is this diagnosed? This condition is diagnosed based on your medical history, your symptoms, and a physical exam. Tests may also be done, including:  Blood tests.  Urine tests to check for myoglobin. You may also have other tests to check for causes of muscle damage and to check for complications. How is this treated? Treatment for this condition helps to:  Make sure you have enough fluids in your body.  Lower the acid levels in your blood to reverse acidosis.  Protect your kidneys. Treatment may include:  Fluids and medicines given through an IV tube that is inserted into one of your veins.  Medicines to lower acidosis or to bring back the balance of the minerals in your body.  Hemodialysis. This treatment uses an artificial kidney machine to filter your blood while you recover. You may have this if other treatments are not helping. Follow these instructions at home:   Take over-the-counter and prescription medicines only as told by your health care provider.  Rest at home until your health care provider says that you can return to your normal activities.  Drink enough fluid to keep your urine clear or pale yellow.  Do not do activities that take a lot of effort (are strenuous). Ask your health care provider what level of exercise is safe for you.  Do not abuse drugs or alcohol. If you are having problems with drug or alcohol use, ask your health care provider for help.  Keep all follow-up visits as told by your health care provider. This is important. Contact a health care provider if:  You start having symptoms of this condition after  treatment. Get help right away if:  You have a seizure.  You bleed easily or cannot control bleeding.  You cannot urinate.  You have chest pain.  You have trouble breathing. This information is not intended to replace advice given to you by your health care provider. Make sure you discuss any questions you have with your health care provider. Document Revised: 11/18/2017 Document Reviewed: 09/17/2016 Elsevier Patient Education  2020 [employer] Inc.  Sickle Cell Anemia, Adult  Sickle cell anemia is a condition in which red blood cells have an abnormal sickle shape. Red blood cells carry oxygen through the body. Sickle-shaped red blood cells do not live as long as normal red blood cells. They also clump together and block blood from flowing through the blood vessels. This condition prevents the body from getting enough oxygen. Sickle cell anemia causes organ damage and pain. It also increases the risk of infection. What are the causes? This condition is caused by a gene that is passed from parent to child (inherited). Receiving two copies of the gene causes the disease.  Receiving one copy causes the "trait," which means that symptoms are milder or not present. What increases the risk? This condition is more likely to develop if your ancestors were from [place], the [place], [place] or [place], the [place], [place], or the [place]. What are the signs or symptoms? Symptoms of this condition include:  Episodes of pain (crises), especially in the hands and feet, joints, back, chest, or abdomen. The pain can be triggered by: ? An illness, especially if there is dehydration. ? Doing an activity with great effort (overexertion). ? Exposure to extreme temperature changes. ? High altitude.  Fatigue.  Shortness of breath or difficulty breathing.  Dizziness.  Pale skin or yellowed skin (jaundice).  Frequent bacterial infections.  Pain and swelling in the hands and feet  (hand-food syndrome).  Prolonged, painful erection of the penis (priapism).  Acute chest syndrome. Symptoms of this include: ? Chest pain. ? Fever. ? Cough. ? Fast breathing.  Stroke.  Decreased activity.  Loss of appetite.  Change in behavior.  Headaches.  Seizures.  Vision changes.  Skin ulcers.  Heart disease.  High blood pressure.  Gallstones.  Liver and kidney problems. How is this diagnosed? This condition is diagnosed with blood tests that check for the gene that causes this condition. How is this treated? There is no cure for most cases of this condition. Treatment focuses on managing your symptoms and preventing complications of the disease. Your health care provider will work with you to identify the best treatment options for you based on an assessment of your condition. Treatment may include:  Medicines, including: ? Pain medicines. ? Antibiotic medicines for infection. ? Medicines to increase the production of a protein in red blood cells that helps carry oxygen in the body (hemoglobin).  Fluids to treat pain and swelling.  Oxygen to treat acute chest syndrome.  Blood transfusions to treat symptoms such as fatigue, stroke, and acute chest syndrome.  Massage and physical therapy for pain.  Regular tests to monitor your condition, such as blood tests, X-rays, CT scans, MRI scans, ultrasounds, and lung function tests. These should be done every 3-12 months, depending on your age.  Hematopoietic stem cell transplant. This is a procedure to replace abnormal stem cells with healthy stem cells from a donor's bone marrow. Stem cells are cells that can develop into blood cells, and bone marrow is the spongy tissue inside the bones. Follow these instructions at home: Medicines  Take over-the-counter and prescription medicines only as told by your health care provider.  If you were prescribed an antibiotic medicine, take it as told by your health care  provider. Do not stop taking the antibiotic even if you start to feel better.  If you develop a fever, do not take medicines to reduce the fever right away. This could cover up another problem. Notify your health care provider. Managing pain, stiffness, and swelling  Try these methods to help ease your pain: ? Using a heating pad. ? Taking a warm bath. ? Distracting yourself, such as by watching TV. Eating and drinking  Drink enough fluid to keep your urine clear or pale yellow. Drink more in hot weather and during exercise.  Limit or avoid drinking alcohol.  Eat a balanced and nutritious diet. Eat plenty of fruits, vegetables, whole grains, and lean protein.  Take vitamins and supplements as directed by your health care provider. Traveling  When traveling, keep these with you: ? Your medical information. ? The names of your health  care providers. ? Your medicines.  If you have to travel by air, ask about precautions you should take. Activity  Get plenty of rest.  Avoid activities that will lower your oxygen levels, such as exercising vigorously. General instructions  Do not use any products that contain nicotine or tobacco, such as cigarettes and e-cigarettes. They lower blood oxygen levels. If you need help quitting, ask your health care provider.  Consider wearing a medical alert bracelet.  Avoid high altitudes.  Avoid extreme temperatures and extreme temperature changes.  Keep all follow-up visits as told by your health care provider. This is important. Contact a health care provider if:  You develop joint pain.  Your feet or hands swell or have pain.  You have fatigue. Get help right away if:  You have symptoms of infection. These include: ? Fever. ? Chills. ? Extreme tiredness. ? Irritability. ? Poor eating. ? Vomiting.  You feel dizzy or faint.  You have new abdominal pain, especially on the left side near the stomach area.  You develop  priapism.  You have numbness in your arms or legs or have trouble moving them.  You have trouble talking.  You develop pain that cannot be controlled with medicine.  You become short of breath.  You have rapid breathing.  You have a persistent cough.  You have pain in your chest.  You develop a severe headache or stiff neck.  You feel bloated without eating or after eating a small amount of food.  Your skin is pale.  You suddenly lose vision. Summary  Sickle cell anemia is a condition in which red blood cells have an abnormal sickle shape. This disease can cause organ damage and chronic pain, and it can raise your risk of infection.  Sickle cell anemia is a genetic disorder.  Treatment focuses on managing your symptoms and preventing complications of the disease.  Get medical help right away if you have any signs of infection, such as a fever. This information is not intended to replace advice given to you by your health care provider. Make sure you discuss any questions you have with your health care provider. Document Revised: 05/23/2019 Document Reviewed: 01/11/2017 Elsevier Patient Education  2020 [employer].

## 2020-08-11 LAB — CULTURE, BLOOD (ROUTINE X 2)
Culture: NO GROWTH
Culture: NO GROWTH
Special Requests: ADEQUATE

## 2020-08-13 ENCOUNTER — Inpatient Hospital Stay: Payer: Medicaid Other | Admitting: Nurse Practitioner

## 2020-08-22 ENCOUNTER — Other Ambulatory Visit: Payer: Self-pay | Admitting: Nurse Practitioner

## 2020-08-25 ENCOUNTER — Other Ambulatory Visit: Payer: Self-pay

## 2020-08-25 DIAGNOSIS — G894 Chronic pain syndrome: Secondary | ICD-10-CM

## 2020-08-25 DIAGNOSIS — D5720 Sickle-cell/Hb-C disease without crisis: Secondary | ICD-10-CM

## 2020-08-27 ENCOUNTER — Other Ambulatory Visit: Payer: Self-pay | Admitting: Nurse Practitioner

## 2020-08-27 ENCOUNTER — Telehealth: Payer: Self-pay | Admitting: Nurse Practitioner

## 2020-08-27 DIAGNOSIS — D5720 Sickle-cell/Hb-C disease without crisis: Secondary | ICD-10-CM

## 2020-08-27 DIAGNOSIS — G894 Chronic pain syndrome: Secondary | ICD-10-CM

## 2020-08-27 DIAGNOSIS — Z79891 Long term (current) use of opiate analgesic: Secondary | ICD-10-CM

## 2020-08-27 MED ORDER — MORPHINE SULFATE ER 30 MG PO TBCR: 30.0000 mg | EXTENDED_RELEASE_TABLET | Freq: Two times a day (BID) | ORAL | 0 refills | Status: DC

## 2020-08-27 MED ORDER — OXYCODONE HCL 15 MG PO TABS: 15.0000 mg | ORAL_TABLET | Freq: Four times a day (QID) | ORAL | 0 refills | Status: DC | PRN

## 2020-08-27 NOTE — Progress Notes (Unsigned)
   [facility] [name], [place]  [address] Phone:  [phone]   Fax:  [phone] PDMP not reviewed this encounter.Subjective:    Golden Emile calls for refill of her medication.    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

## 2020-08-27 NOTE — Telephone Encounter (Signed)
rx refill oxyco 15 mg, morphine 30 mg, tizanidine 2 mg, ibuprohen

## 2020-08-28 ENCOUNTER — Telehealth: Payer: Self-pay | Admitting: Nurse Practitioner

## 2020-08-28 ENCOUNTER — Ambulatory Visit (INDEPENDENT_AMBULATORY_CARE_PROVIDER_SITE_OTHER): Payer: Medicaid Other | Admitting: Nurse Practitioner

## 2020-08-28 ENCOUNTER — Other Ambulatory Visit: Payer: Self-pay

## 2020-08-28 DIAGNOSIS — G894 Chronic pain syndrome: Secondary | ICD-10-CM | POA: Diagnosis not present

## 2020-08-28 DIAGNOSIS — Z79891 Long term (current) use of opiate analgesic: Secondary | ICD-10-CM | POA: Diagnosis not present

## 2020-08-28 DIAGNOSIS — K219 Gastro-esophageal reflux disease without esophagitis: Secondary | ICD-10-CM

## 2020-08-28 DIAGNOSIS — D5720 Sickle-cell/Hb-C disease without crisis: Secondary | ICD-10-CM | POA: Diagnosis not present

## 2020-08-28 DIAGNOSIS — Z87898 Personal history of other specified conditions: Secondary | ICD-10-CM

## 2020-08-28 DIAGNOSIS — F1491 Cocaine use, unspecified, in remission: Secondary | ICD-10-CM

## 2020-08-28 LAB — POCT URINALYSIS DIPSTICK
Bilirubin, UA: NEGATIVE
Glucose, UA: NEGATIVE
Ketones, UA: NEGATIVE
Leukocytes, UA: NEGATIVE
Nitrite, UA: NEGATIVE
Protein, UA: POSITIVE — AB
Spec Grav, UA: >=1.03 — AB (ref 1.010–1.025)
Urobilinogen, UA: 0.2 E.U./dL
pH, UA: 6 (ref 5.0–8.0)

## 2020-08-28 NOTE — Telephone Encounter (Signed)
Patient has an appointment today we will refill at that time.

## 2020-08-28 NOTE — Progress Notes (Signed)
[facility] [place], [place]  [address] Phone:  [phone]   Fax:  [phone]   Established Patient Office Visit  Subjective:  Patient ID: Hannah Hawkins, female    DOB: 02-15-1986  Age: 34 y.o. MRN: [id number]  CC:  Chief Complaint  Patient presents with  . Follow-up    body sore    HPI Hannah Hawkins presents for follow up. She  has a past medical history of Sickle cell anemia ([place]).   She is rating 10/10 pain for the generalized over body pain . She describes it as being sore. She Denies fever, headache, cough, wheezing, shortness of breath or  chest pains. Denies any open wounds, skin irritation.ore She no longer taking the hydrea or the folic acid.     Past Medical History:  Diagnosis Date  . Sickle cell anemia (HCC)     Past Surgical History:  Procedure Laterality Date  . EYE SURGERY  03/09/2017   left eye  . EYE SURGERY Right 09/2019   right hemerage     Family History  Problem Relation Age of Onset  . Diabetes Father     Social History   Socioeconomic History  . Marital status: Single    Spouse name: Not on file  . Number of children: Not on file  . Years of education: Not on file  . Highest education level: Not on file  Occupational History  . Not on file  Tobacco Use  . Smoking status: Former Smoker    Types: Cigarettes  . Smokeless tobacco: Never Used  Vaping Use  . Vaping Use: Never used  Substance and Sexual Activity  . Alcohol use: Not Currently  . Drug use: No  . Sexual activity: Yes  Other Topics Concern  . Not on file  Social History Narrative  . Not on file   Social Determinants of Health   Financial Resource Strain:   . Difficulty of Paying Living Expenses: Not on file  Food Insecurity:   . Worried About Charity fundraiser in the Last Year: Not on file  . Ran Out of Food in the Last Year: Not on file  Transportation Needs:   . Lack of Transportation (Medical): Not on file  . Lack  of Transportation (Non-Medical): Not on file  Physical Activity:   . Days of Exercise per Week: Not on file  . Minutes of Exercise per Session: Not on file  Stress:   . Feeling of Stress : Not on file  Social Connections:   . Frequency of Communication with Friends and Family: Not on file  . Frequency of Social Gatherings with Friends and Family: Not on file  . Attends Religious Services: Not on file  . Active Member of Clubs or Organizations: Not on file  . Attends Archivist Meetings: Not on file  . Marital Status: Not on file  Intimate Partner Violence:   . Fear of Current or Ex-Partner: Not on file  . Emotionally Abused: Not on file  . Physically Abused: Not on file  . Sexually Abused: Not on file    Outpatient Medications Prior to Visit  Medication Sig Dispense Refill  . Iron-Vitamins (GERITOL PO) Take 1 tablet by mouth daily.    Marland Kitchen levocetirizine (XYZAL) 5 MG tablet Take 5 mg by mouth every evening.    . lidocaine (LIDODERM) 5 % Place 1 patch onto the skin daily. Remove & Discard patch within 12 hours or as  directed by MD (Patient taking differently: Place 1 patch onto the skin every 12 (twelve) hours as needed (pain). Remove & Discard patch within 12 hours or as directed by MD) 30 patch 2  . montelukast (SINGULAIR) 10 MG tablet Take 1 tablet (10 mg total) by mouth at bedtime. 30 tablet 3  . morphine (MS CONTIN) 30 MG 12 hr tablet Take 1 tablet (30 mg total) by mouth every 12 (twelve) hours. 60 tablet 0  . NARCAN 4 MG/0.1ML LIQD nasal spray kit Place 4 mg into the nose once as needed (overdose).   0  . ondansetron (ZOFRAN) 4 MG tablet Take 4 mg by mouth every 8 (eight) hours as needed for nausea or vomiting.    Marland Kitchen oxyCODONE (ROXICODONE) 15 MG immediate release tablet Take 1 tablet (15 mg total) by mouth every 6 (six) hours as needed for up to 15 days for pain (moderate to severe pain). 60 tablet 0  . pantoprazole (PROTONIX) 40 MG tablet Take 1 tablet (40 mg total) by mouth  daily. 30 tablet 3  . promethazine (PHENERGAN) 12.5 MG tablet Take 1 tablet (12.5 mg total) by mouth every 4 (four) hours as needed for nausea or vomiting. 30 tablet 0  . albuterol (VENTOLIN HFA) 108 (90 Base) MCG/ACT inhaler Inhale 2 puffs into the lungs every 6 (six) hours as needed for wheezing or shortness of breath. (Patient not taking: Reported on 07/24/2020) 8 g 0  . citalopram (CELEXA) 10 MG tablet TAKE 1 TABLET(10 MG) BY MOUTH DAILY FOR 14 DAYS THEN TAKE 2 TABLETS(20 MG) BY MOUTH DAILY (Patient not taking: Reported on 08/28/2020) 90 tablet 0  . ergocalciferol (VITAMIN D2) 1.25 MG (50000 UT) capsule Take 1 capsule (50,000 Units total) by mouth once a week. (Patient not taking: Reported on 07/24/2020) 12 capsule 3  . sucralfate (CARAFATE) 1 GM/10ML suspension Take 10 mLs (1 g total) by mouth 4 (four) times daily for 21 days. (Patient taking differently: Take 1 g by mouth 4 (four) times daily as needed. Pt takes as needed for stomach pain) 420 mL 1  . tiZANidine (ZANAFLEX) 2 MG tablet TAKE 1 TABLET(2 MG) BY MOUTH EVERY 8 HOURS AS NEEDED FOR MUSCLE SPASMS (Patient taking differently: Take 2 mg by mouth every 8 (eight) hours as needed for muscle spasms. ) 45 tablet 2   No facility-administered medications prior to visit.    Allergies  Allergen Reactions  . Penicillins Swelling and Rash    Has patient had a PCN reaction causing immediate rash, facial/tongue/throat swelling, SOB or lightheadedness with hypotension: Yes Has patient had a PCN reaction causing severe rash involving mucus membranes or skin necrosis: Yes Has patient had a PCN reaction that required hospitalization No Has patient had a PCN reaction occurring within the last 10 years: no If all of the above answers are "NO", then may proceed with Cephalosporin use.     ROS Review of Systems  All other systems reviewed and are negative.     Objective:    Physical Exam Constitutional:      General: She is not in acute distress.     Appearance: She is not ill-appearing, toxic-appearing or diaphoretic.     Comments: Moving around with out difficulty   HENT:     Head: Normocephalic and atraumatic.     Nose: Nose normal.     Mouth/Throat:     Mouth: Mucous membranes are moist.  Cardiovascular:     Rate and Rhythm: Normal rate and regular rhythm.  Pulses: Normal pulses.     Heart sounds: Normal heart sounds.  Pulmonary:     Effort: Pulmonary effort is normal.     Breath sounds: Normal breath sounds.  Abdominal:     Palpations: Abdomen is soft.  Musculoskeletal:        General: Normal range of motion.     Cervical back: Normal range of motion.  Skin:    General: Skin is warm and dry.     Capillary Refill: Capillary refill takes less than 2 seconds.  Neurological:     General: No focal deficit present.     Mental Status: She is alert and oriented to person, place, and time.     LMP 07/31/2020 (Approximate)  Wt Readings from Last 3 Encounters:  08/08/20 148 lb (67.1 kg)  07/24/20 146 lb (66.2 kg)  07/24/20 143 lb 12.8 oz (65.2 kg)     Health Maintenance Due  Topic Date Due  . COVID-19 Vaccine (1) Never done    There are no preventive care reminders to display for this patient.  Lab Results  Component Value Date   TSH 1.940 07/24/2020   Lab Results  Component Value Date   WBC 10.7 (H) 08/09/2020   HGB 9.8 (L) 08/09/2020   HCT 26.0 (L) 08/09/2020   MCV 71.4 (L) 08/09/2020   PLT 259 08/09/2020   Lab Results  Component Value Date   NA 140 08/10/2020   K 3.1 (L) 08/10/2020   CO2 31 08/10/2020   GLUCOSE 100 (H) 08/10/2020   BUN 5 (L) 08/10/2020   CREATININE 0.53 08/10/2020   BILITOT 0.9 08/10/2020   ALKPHOS 49 08/10/2020   AST 85 (H) 08/10/2020   ALT 177 (H) 08/10/2020   PROT 6.9 08/10/2020   ALBUMIN 4.0 08/10/2020   CALCIUM 8.8 (L) 08/10/2020   ANIONGAP 9 08/10/2020   Lab Results  Component Value Date   CHOL 113 08/07/2020   Lab Results  Component Value Date   HDL 38 (L)  08/07/2020   Lab Results  Component Value Date   LDLCALC 65 08/07/2020   Lab Results  Component Value Date   TRIG 52 08/07/2020   Lab Results  Component Value Date   CHOLHDL 3.0 08/07/2020   No results found for: HGBA1C    Assessment & Plan:   Problem List Items Addressed This Visit      Digestive   GERD (gastroesophageal reflux disease)     Other   Chronic pain syndrome   Relevant Orders   [id number] 11+Oxyco+Alc+Crt-Bund (Completed)   History of cocaine use   Sickle-cell/Hb-C disease (HCC) - Primary (Chronic) Ensure adequate hydration. Move frequently to reduce venous thromboembolism risk. Avoid situations that could lead to dehydration or could exacerbate pain Discussed S&S of infection, seizures, stroke acute chest, DVT and how important it is to seek medical attention Take medication as directed along with pain contract and overall compliance Discussed the risk related to opiate use (addition, tolerance and dependency)     Relevant Orders   POCT urinalysis dipstick (Completed)   [id number] 11+Oxyco+Alc+Crt-Bund (Completed)    Other Visit Diagnoses    Chronic prescription opiate use          No orders of the defined types were placed in this encounter.   Follow-up: Return in about 2 months (around 10/28/2020).    Vevelyn Francois, NP

## 2020-08-29 NOTE — Telephone Encounter (Signed)
Sent to NP 

## 2020-08-30 LAB — DRUG SCREEN 764883 11+OXYCO+ALC+CRT-BUND
Amphetamines, Urine: NEGATIVE ng/mL
BENZODIAZ UR QL: NEGATIVE ng/mL
Barbiturate: NEGATIVE ng/mL
Cannabinoid Quant, Ur: NEGATIVE ng/mL
Cocaine (Metabolite): NEGATIVE ng/mL
Creatinine: 380 mg/dL — ABNORMAL HIGH (ref 20.0–300.0)
Ethanol: NEGATIVE %
Meperidine: NEGATIVE ng/mL
Methadone Screen, Urine: NEGATIVE ng/mL
OPIATE SCREEN URINE: NEGATIVE ng/mL
Oxycodone/Oxymorphone, Urine: NEGATIVE ng/mL
Phencyclidine: NEGATIVE ng/mL
Propoxyphene: NEGATIVE ng/mL
Tramadol: NEGATIVE ng/mL
pH, Urine: 6.6 (ref 4.5–8.9)

## 2020-09-01 ENCOUNTER — Telehealth: Payer: Self-pay | Admitting: Nurse Practitioner

## 2020-09-01 NOTE — Telephone Encounter (Signed)
Spoke with patient on the phone.

## 2020-09-02 ENCOUNTER — Encounter: Payer: Self-pay | Admitting: Nurse Practitioner

## 2020-09-02 NOTE — Telephone Encounter (Signed)
Refills was sent to pharmacy on 08-28-2020, prior authorization was approved for Oxycodone, prior authorization was submitted of Morphine but it has to be resubmitted

## 2020-09-03 ENCOUNTER — Other Ambulatory Visit: Payer: Self-pay | Admitting: Nurse Practitioner

## 2020-09-03 MED ORDER — CITALOPRAM HYDROBROMIDE 20 MG PO TABS: 20.0000 mg | ORAL_TABLET | Freq: Every day | ORAL | 11 refills | Status: DC

## 2020-09-03 NOTE — Telephone Encounter (Signed)
Resubmit another prior authorization for Morphine on 09-02-2020.   Rep at [facility] stated that it will take 24 hours for review and authorization

## 2020-09-03 NOTE — Telephone Encounter (Signed)
Will forward to correct provider  

## 2020-09-10 ENCOUNTER — Other Ambulatory Visit: Payer: Self-pay

## 2020-09-10 DIAGNOSIS — D5720 Sickle-cell/Hb-C disease without crisis: Secondary | ICD-10-CM

## 2020-09-10 DIAGNOSIS — G894 Chronic pain syndrome: Secondary | ICD-10-CM

## 2020-09-11 ENCOUNTER — Other Ambulatory Visit: Payer: Self-pay | Admitting: Nurse Practitioner

## 2020-09-11 ENCOUNTER — Telehealth: Payer: Self-pay | Admitting: Nurse Practitioner

## 2020-09-11 DIAGNOSIS — D5720 Sickle-cell/Hb-C disease without crisis: Secondary | ICD-10-CM

## 2020-09-11 DIAGNOSIS — G894 Chronic pain syndrome: Secondary | ICD-10-CM

## 2020-09-11 MED ORDER — OXYCODONE HCL 15 MG PO TABS: 15.0000 mg | ORAL_TABLET | Freq: Four times a day (QID) | ORAL | 0 refills | Status: DC | PRN

## 2020-09-11 NOTE — Telephone Encounter (Signed)
sent 

## 2020-09-19 ENCOUNTER — Telehealth: Payer: Self-pay | Admitting: Nurse Practitioner

## 2020-09-19 NOTE — Telephone Encounter (Signed)
She needs a myChart apt not telephone

## 2020-09-19 NOTE — Telephone Encounter (Signed)
Pt is requesting medication for an eye infection. Would also like a call back

## 2020-09-22 ENCOUNTER — Other Ambulatory Visit: Payer: Self-pay

## 2020-09-22 DIAGNOSIS — G894 Chronic pain syndrome: Secondary | ICD-10-CM | POA: Insufficient documentation

## 2020-09-22 DIAGNOSIS — M79652 Pain in left thigh: Secondary | ICD-10-CM | POA: Insufficient documentation

## 2020-09-22 DIAGNOSIS — Z880 Allergy status to penicillin: Secondary | ICD-10-CM | POA: Diagnosis not present

## 2020-09-22 DIAGNOSIS — Z791 Long term (current) use of non-steroidal anti-inflammatories (NSAID): Secondary | ICD-10-CM | POA: Insufficient documentation

## 2020-09-22 DIAGNOSIS — D638 Anemia in other chronic diseases classified elsewhere: Secondary | ICD-10-CM | POA: Diagnosis not present

## 2020-09-22 DIAGNOSIS — F1120 Opioid dependence, uncomplicated: Secondary | ICD-10-CM | POA: Diagnosis not present

## 2020-09-22 DIAGNOSIS — Z87891 Personal history of nicotine dependence: Secondary | ICD-10-CM | POA: Insufficient documentation

## 2020-09-22 DIAGNOSIS — D5700 Hb-SS disease with crisis, unspecified: Secondary | ICD-10-CM | POA: Insufficient documentation

## 2020-09-22 DIAGNOSIS — Z79899 Other long term (current) drug therapy: Secondary | ICD-10-CM | POA: Diagnosis not present

## 2020-09-22 NOTE — ED Triage Notes (Signed)
Pt reports sickle cell pain with c/o right lower leg pain and left eye pain sent to ED by eye doctor Dr. Allena Katz

## 2020-09-23 ENCOUNTER — Encounter (HOSPITAL_COMMUNITY): Payer: Self-pay | Admitting: Family Medicine

## 2020-09-23 ENCOUNTER — Non-Acute Institutional Stay (HOSPITAL_BASED_OUTPATIENT_CLINIC_OR_DEPARTMENT_OTHER)
Admission: AD | Admit: 2020-09-23 | Discharge: 2020-09-23 | Disposition: A | Discharge status: Home / Self Care | Payer: Medicaid Other | Source: Ambulatory Visit | Attending: Internal Medicine | Admitting: Internal Medicine

## 2020-09-23 ENCOUNTER — Emergency Department (HOSPITAL_COMMUNITY)
Admission: EM | Admit: 2020-09-23 | Discharge: 2020-09-23 | Disposition: A | Discharge status: Home / Self Care | Payer: Medicaid Other | Attending: Emergency Medicine | Admitting: Emergency Medicine

## 2020-09-23 DIAGNOSIS — Z791 Long term (current) use of non-steroidal anti-inflammatories (NSAID): Secondary | ICD-10-CM | POA: Insufficient documentation

## 2020-09-23 DIAGNOSIS — F1120 Opioid dependence, uncomplicated: Secondary | ICD-10-CM | POA: Insufficient documentation

## 2020-09-23 DIAGNOSIS — D5700 Hb-SS disease with crisis, unspecified: Secondary | ICD-10-CM

## 2020-09-23 DIAGNOSIS — Z87891 Personal history of nicotine dependence: Secondary | ICD-10-CM | POA: Insufficient documentation

## 2020-09-23 DIAGNOSIS — Z79899 Other long term (current) drug therapy: Secondary | ICD-10-CM | POA: Insufficient documentation

## 2020-09-23 DIAGNOSIS — M79652 Pain in left thigh: Secondary | ICD-10-CM | POA: Insufficient documentation

## 2020-09-23 DIAGNOSIS — Z880 Allergy status to penicillin: Secondary | ICD-10-CM | POA: Insufficient documentation

## 2020-09-23 DIAGNOSIS — D638 Anemia in other chronic diseases classified elsewhere: Secondary | ICD-10-CM | POA: Insufficient documentation

## 2020-09-23 DIAGNOSIS — G894 Chronic pain syndrome: Secondary | ICD-10-CM | POA: Insufficient documentation

## 2020-09-23 DIAGNOSIS — D57219 Sickle-cell/Hb-C disease with crisis, unspecified: Secondary | ICD-10-CM | POA: Insufficient documentation

## 2020-09-23 LAB — CBC WITH DIFFERENTIAL/PLATELET
Abs Immature Granulocytes: 0.04 10*3/uL (ref 0.00–0.07)
Basophils Absolute: 0.1 10*3/uL (ref 0.0–0.1)
Basophils Relative: 1 %
Eosinophils Absolute: 1.6 10*3/uL — ABNORMAL HIGH (ref 0.0–0.5)
Eosinophils Relative: 16 %
HCT: 27 % — ABNORMAL LOW (ref 36.0–46.0)
Hemoglobin: 9.7 g/dL — ABNORMAL LOW (ref 12.0–15.0)
Immature Granulocytes: 0 %
Lymphocytes Relative: 36 %
Lymphs Abs: 3.5 10*3/uL (ref 0.7–4.0)
MCH: 27.4 pg (ref 26.0–34.0)
MCHC: 35.9 g/dL (ref 30.0–36.0)
MCV: 76.3 fL — ABNORMAL LOW (ref 80.0–100.0)
Monocytes Absolute: 0.9 10*3/uL (ref 0.1–1.0)
Monocytes Relative: 9 %
Neutro Abs: 3.7 10*3/uL (ref 1.7–7.7)
Neutrophils Relative %: 38 %
Platelets: 394 10*3/uL (ref 150–400)
RBC: 3.54 MIL/uL — ABNORMAL LOW (ref 3.87–5.11)
RDW: 15.5 % (ref 11.5–15.5)
WBC: 9.8 10*3/uL (ref 4.0–10.5)
nRBC: 7.8 % — ABNORMAL HIGH (ref 0.0–0.2)

## 2020-09-23 LAB — COMPREHENSIVE METABOLIC PANEL
ALT: 17 U/L (ref 0–44)
AST: 33 U/L (ref 15–41)
Albumin: 3.9 g/dL (ref 3.5–5.0)
Alkaline Phosphatase: 52 U/L (ref 38–126)
Anion gap: 6 (ref 5–15)
BUN: 6 mg/dL (ref 6–20)
CO2: 28 mmol/L (ref 22–32)
Calcium: 8.7 mg/dL — ABNORMAL LOW (ref 8.9–10.3)
Chloride: 102 mmol/L (ref 98–111)
Creatinine, Ser: 0.69 mg/dL (ref 0.44–1.00)
GFR calc Af Amer: >60 mL/min (ref >60)
GFR calc non Af Amer: >60 mL/min (ref >60)
Glucose, Bld: 94 mg/dL (ref 70–99)
Potassium: 4 mmol/L (ref 3.5–5.1)
Sodium: 136 mmol/L (ref 135–145)
Total Bilirubin: 0.8 mg/dL (ref 0.3–1.2)
Total Protein: 6.9 g/dL (ref 6.5–8.1)

## 2020-09-23 LAB — RETICULOCYTES
Immature Retic Fract: 38.3 % — ABNORMAL HIGH (ref 2.3–15.9)
RBC.: 3.51 MIL/uL — ABNORMAL LOW (ref 3.87–5.11)
Retic Count, Absolute: 265 10*3/uL — ABNORMAL HIGH (ref 19.0–186.0)
Retic Ct Pct: 8.4 % — ABNORMAL HIGH (ref 0.4–3.1)

## 2020-09-23 LAB — I-STAT BETA HCG BLOOD, ED (MC, WL, AP ONLY): I-stat hCG, quantitative: <5 m[IU]/mL (ref <5)

## 2020-09-23 MED ORDER — DIPHENHYDRAMINE HCL 50 MG/ML IJ SOLN
25.0000 mg | Freq: Once | INTRAMUSCULAR | Status: AC
  Administered 2020-09-23: 25 mg via INTRAVENOUS
  Filled 2020-09-23: qty 1

## 2020-09-23 MED ORDER — SODIUM CHLORIDE 0.9% FLUSH: 9.0000 mL | INTRAVENOUS | Status: DC | PRN

## 2020-09-23 MED ORDER — HYDROMORPHONE HCL 2 MG/ML IJ SOLN
2.0000 mg | INTRAMUSCULAR | Status: AC
  Administered 2020-09-23: 2 mg via INTRAVENOUS

## 2020-09-23 MED ORDER — ONDANSETRON HCL 4 MG/2ML IJ SOLN: 4.0000 mg | INTRAMUSCULAR | Status: DC | PRN

## 2020-09-23 MED ORDER — HYDROMORPHONE HCL 2 MG/ML IJ SOLN
2.0000 mg | INTRAMUSCULAR | Status: AC
  Administered 2020-09-23: 2 mg via INTRAVENOUS
  Filled 2020-09-23 (×2): qty 1

## 2020-09-23 MED ORDER — ONDANSETRON HCL 4 MG/2ML IJ SOLN: 4.0000 mg | Freq: Four times a day (QID) | INTRAMUSCULAR | Status: DC | PRN

## 2020-09-23 MED ORDER — KETOROLAC TROMETHAMINE 15 MG/ML IJ SOLN
15.0000 mg | INTRAMUSCULAR | Status: AC
  Administered 2020-09-23: 15 mg via INTRAVENOUS
  Filled 2020-09-23: qty 1

## 2020-09-23 MED ORDER — HYDROMORPHONE HCL 2 MG/ML IJ SOLN
2.0000 mg | INTRAMUSCULAR | Status: AC
  Administered 2020-09-23: 2 mg via INTRAVENOUS
  Filled 2020-09-23: qty 1

## 2020-09-23 MED ORDER — DIPHENHYDRAMINE HCL 25 MG PO CAPS
25.0000 mg | ORAL_CAPSULE | ORAL | Status: DC | PRN
  Filled 2020-09-23: qty 1

## 2020-09-23 MED ORDER — NALOXONE HCL 0.4 MG/ML IJ SOLN: 0.4000 mg | INTRAMUSCULAR | Status: DC | PRN

## 2020-09-23 MED ORDER — SODIUM CHLORIDE 0.45 % IV SOLN: INTRAVENOUS | Status: DC

## 2020-09-23 MED ORDER — HYDROMORPHONE 1 MG/ML IV SOLN
INTRAVENOUS | Status: DC
  Administered 2020-09-23: 9.5 mg via INTRAVENOUS
  Administered 2020-09-23: 30 mg via INTRAVENOUS
  Filled 2020-09-23: qty 30

## 2020-09-23 NOTE — Progress Notes (Signed)
Patient admitted to the day hospital for treatment of sickle cell pain crisis. Patient reported pain rated 10/10 in the right leg, left arm and left eye. Patient placed on Dilaudid PCA and hydrated with IV fluids. At discharge patient reported  pain at 5/10. Discharge instructions given to patient. Patient alert, oriented and transported in a wheelchair at discharge.

## 2020-09-23 NOTE — ED Provider Notes (Signed)
  Physical Exam  BP (!) 124/94   Pulse 60   Temp 97.7 F (36.5 C) (Oral)   Resp 17   Ht 5\' 3"  (1.6 m)   Wt 68.5 kg   SpO2 97%   BMI 26.75 kg/m   Physical Exam  ED Course/Procedures     Procedures  MDM  Received care from Dr. see note for prior hx, physical and care. Presents with pain consistent with prior sickle cell pain crisis and eye pain. Discussed with Dr. Olegario Shearer who did not see any abnormalities on eye exam. No vision changes to suggest other central etiology. Pain improved however continuing. Accepted to sickle cell clinic for further pain control.      Allena Katz, MD 09/23/20 2127

## 2020-09-23 NOTE — Discharge Instructions (Signed)
Sickle Cell Anemia, Adult  Sickle cell anemia is a condition where your red blood cells are shaped like sickles. Red blood cells carry oxygen through the body. Sickle-shaped cells do not live as long as normal red blood cells. They also clump together and block blood from flowing through the blood vessels. This prevents the body from getting enough oxygen. Sickle cell anemia causes organ damage and pain. It also increases the risk of infection. Follow these instructions at home: Medicines  Take over-the-counter and prescription medicines only as told by your doctor.  If you were prescribed an antibiotic medicine, take it as told by your doctor. Do not stop taking the antibiotic even if you start to feel better.  If you develop a fever, do not take medicines to lower the fever right away. Tell your doctor about the fever. Managing pain, stiffness, and swelling  Try these methods to help with pain: ? Use a heating pad. ? Take a warm bath. ? Distract yourself, such as by watching TV. Eating and drinking  Drink enough fluid to keep your pee (urine) clear or pale yellow. Drink more in hot weather and during exercise.  Limit or avoid alcohol.  Eat a healthy diet. Eat plenty of fruits, vegetables, whole grains, and lean protein.  Take vitamins and supplements as told by your doctor. Traveling  When traveling, keep these with you: ? Your medical information. ? The names of your doctors. ? Your medicines.  If you need to take an airplane, talk to your doctor first. Activity  Rest often.  Avoid exercises that make your heart beat much faster, such as jogging. General instructions  Do not use products that have nicotine or tobacco, such as cigarettes and e-cigarettes. If you need help quitting, ask your doctor.  Consider wearing a medical alert bracelet.  Avoid being in high places (high altitudes), such as mountains.  Avoid very hot or cold temperatures.  Avoid places where the  temperature changes a lot.  Keep all follow-up visits as told by your doctor. This is important. Contact a doctor if:  A joint hurts.  Your feet or hands hurt or swell.  You feel tired (fatigued). Get help right away if:  You have symptoms of infection. These include: ? Fever. ? Chills. ? Being very tired. ? Irritability. ? Poor eating. ? Throwing up (vomiting).  You feel dizzy or faint.  You have new stomach pain, especially on the left side.  You have a an erection (priapism) that lasts more than 4 hours.  You have numbness in your arms or legs.  You have a hard time moving your arms or legs.  You have trouble talking.  You have pain that does not go away when you take medicine.  You are short of breath.  You are breathing fast.  You have a long-term cough.  You have pain in your chest.  You have a bad headache.  You have a stiff neck.  Your stomach looks bloated even though you did not eat much.  Your skin is pale.  You suddenly cannot see well. Summary  Sickle cell anemia is a condition where your red blood cells are shaped like sickles.  Follow your doctor's advice on ways to manage pain, food to eat, activities to do, and steps to take for safe travel.  Get medical help right away if you have any signs of infection, such as a fever. This information is not intended to replace advice given to you by   your health care provider. Make sure you discuss any questions you have with your health care provider. Document Revised: 03/30/2019 Document Reviewed: 01/11/2017 Elsevier Patient Education  2020 [employer]  

## 2020-09-23 NOTE — ED Notes (Signed)
Report called to Louie Bun, RN at [facility].  Patient transported over by [place], [place].  IV left in place for use there.

## 2020-09-23 NOTE — ED Provider Notes (Addendum)
[facility] DEPT Provider Note   CSN: [id number] Arrival date & time: 09/22/20  2132     History Chief Complaint  Patient presents with  . Sickle Cell Pain Crisis    Hannah Hawkins is a 34 y.o. female.  Patient presents to the emergency department for sickle cell crisis.  Patient has been experiencing right leg and left thigh pain today.  She saw Dr. Jalene Mullet earlier today who evaluated her eye.  He did not find any abnormalities on examination and felt that the pain was likely secondary to her sickle cell disease and sent her to the ED for treatment of sickle cell crisis.  Patient's right leg pain is typical for her crisis as well.  She is not experiencing any chest pain.  No fever.  Patient denies any visual acuity change, she can see fine out of the left eye, just experiencing pain.        Past Medical History:  Diagnosis Date  . Sickle cell anemia [facility])     Patient Active Problem List   Diagnosis Date Noted  . Epigastric pain   . Lactic acidosis   . RUQ abdominal pain   . Rhabdomyolysis 08/07/2020  . Sickle cell crisis ([place]) 08/06/2020  . Elevated LFTs 08/06/2020  . 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 Leukocytosis 08/06/2020  . SIRS (systemic inflammatory response syndrome) ([place]) 08/06/2020  . Non-intractable vomiting   . Joint pain in fingers of both hands 06/11/2020  . Hypokalemia 07/17/2019  . GERD (gastroesophageal reflux disease) 05/29/2019  . History of cocaine use 05/18/2019  . Chronic pain syndrome   . Cocaine use   . Sickle cell pain crisis ([place]) 11/14/2017  . Anemia of chronic disease 11/13/2017  . Sinusitis, chronic 11/13/2017  . 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 EYE SURGERY  03/09/2017   left eye  . EYE SURGERY Right 09/2019   right hemerage      OB History    Gravida  2   Para      Term      Preterm      AB      Living  1     SAB       TAB      Ectopic      Multiple      Live Births              Family History  Problem Relation Age of Onset  . Diabetes Father     Social History   Tobacco Use  . Smoking status: Former Smoker    Types: Cigarettes  . Smokeless tobacco: Never Used  Vaping Use  . Vaping Use: Never used  Substance Use Topics  . Alcohol use: Not Currently  . Drug use: No    Home Medications Prior to Admission medications   Medication Sig Start Date End Date Taking? Authorizing Provider  citalopram (CELEXA) 20 MG tablet Take 1 tablet (20 mg total) by mouth daily. 09/03/20 09/03/21 Yes King, Diona Foley, NP  ibuprofen (ADVIL) 800 MG tablet Take 800 mg by mouth 3 (three) times daily. 09/20/20  Yes [provider]  Iron-Vitamins (GERITOL PO) Take 1 tablet by mouth daily.   Yes [provider]  levocetirizine (XYZAL) 5 MG tablet Take 5 mg by mouth every evening.   Yes [provider]  lidocaine (LIDODERM) 5 % Place 1 patch onto the skin daily. Remove &  Discard patch within 12 hours or as directed by MD Patient taking differently: Place 1 patch onto the skin every 12 (twelve) hours as needed (pain). Remove & Discard patch within 12 hours or as directed by MD 05/16/20  Yes Vevelyn Francois, NP  montelukast (SINGULAIR) 10 MG tablet Take 1 tablet (10 mg total) by mouth at bedtime. 05/02/20  Yes Vevelyn Francois, NP  morphine (MS CONTIN) 30 MG 12 hr tablet Take 1 tablet (30 mg total) by mouth every 12 (twelve) hours. 08/27/20 09/26/20 Yes Vevelyn Francois, NP  oxyCODONE (ROXICODONE) 15 MG immediate release tablet Take 1 tablet (15 mg total) by mouth every 6 (six) hours as needed for up to 15 days for pain (moderate to severe pain). 09/11/20 09/26/20 Yes King, Diona Foley, NP  pantoprazole (PROTONIX) 40 MG tablet Take 1 tablet (40 mg total) by mouth daily. 05/10/19  Yes Lanae Boast, FNP  promethazine (PHENERGAN) 12.5 MG tablet Take 1 tablet (12.5 mg total) by mouth every 4 (four) hours as  needed for nausea or vomiting. 08/10/20  Yes Azzie Glatter, FNP  sucralfate (CARAFATE) 1 GM/10ML suspension Take 10 mLs (1 g total) by mouth 4 (four) times daily for 21 days. Patient taking differently: Take 1 g by mouth 4 (four) times daily as needed. Pt takes as needed for stomach pain 05/16/20 09/23/20 Yes Jegede, [name], MD  tiZANidine (ZANAFLEX) 2 MG tablet TAKE 1 TABLET(2 MG) BY MOUTH EVERY 8 HOURS AS NEEDED FOR MUSCLE SPASMS Patient taking differently: Take 2 mg by mouth every 8 (eight) hours as needed for muscle spasms.  07/10/20  Yes Vevelyn Francois, NP  albuterol (VENTOLIN HFA) 108 (90 Base) MCG/ACT inhaler Inhale 2 puffs into the lungs every 6 (six) hours as needed for wheezing or shortness of breath. Patient not taking: Reported on 07/24/2020 05/02/20   Vevelyn Francois, NP  ergocalciferol (VITAMIN D2) 1.25 MG (50000 UT) capsule Take 1 capsule (50,000 Units total) by mouth once a week. Patient not taking: Reported on 07/24/2020 05/10/19   Lanae Boast, FNP  [facility] 4 MG/0.1ML LIQD nasal spray kit Place 4 mg into the nose once as needed (overdose).  12/22/17   [provider]  ondansetron (ZOFRAN) 4 MG tablet Take 4 mg by mouth every 8 (eight) hours as needed for nausea or vomiting. Patient not taking: Reported on 09/23/2020    [provider]    Allergies    Penicillins  Review of Systems   Review of Systems  Constitutional: Negative for chills and fever.  Eyes: Positive for pain. Negative for visual disturbance.  Cardiovascular: Negative for chest pain.  Gastrointestinal: Negative for abdominal pain.  Genitourinary: Positive for dysuria.  Musculoskeletal: Positive for arthralgias. Negative for back pain.  Skin: Negative for color change and rash.  Neurological: Negative for syncope.  All other systems reviewed and are negative.   Physical Exam Updated Vital Signs BP 122/80 (BP Location: Left Arm)   Pulse (!) 55   Temp 98 F (36.7 C) (Oral)   Resp 16    Ht '5\' 3"'  (1.6 m)   Wt 68.5 kg   SpO2 98%   BMI 26.75 kg/m   Physical Exam Vitals and nursing note reviewed.  Constitutional:      General: She is not in acute distress.    Appearance: Normal appearance. She is well-developed.  HENT:     Head: Normocephalic and atraumatic.     Right Ear: Hearing normal.     Left  Ear: Hearing normal.     Nose: Nose normal.  Eyes:     Conjunctiva/sclera: Conjunctivae normal.     Pupils: Pupils are equal, round, and reactive to light.  Cardiovascular:     Rate and Rhythm: Regular rhythm.     Heart sounds: S1 normal and S2 normal. No murmur heard.  No friction rub. No gallop.   Pulmonary:     Effort: Pulmonary effort is normal. No respiratory distress.     Breath sounds: Normal breath sounds.  Chest:     Chest wall: No tenderness.  Abdominal:     General: Bowel sounds are normal.     Palpations: Abdomen is soft.     Tenderness: There is no abdominal tenderness. There is no guarding or rebound. Negative signs include Murphy's sign and McBurney's sign.     Hernia: No hernia is present.  Musculoskeletal:        General: Normal range of motion.     Cervical back: Normal range of motion and neck supple.     Right lower leg: Tenderness present. No swelling or deformity. No edema.     Comments: No calf tenderness or swelling  Skin:    General: Skin is warm and dry.     Findings: No rash.  Neurological:     Mental Status: She is alert and oriented to person, place, and time.     GCS: GCS eye subscore is 4. GCS verbal subscore is 5. GCS motor subscore is 6.     Cranial Nerves: No cranial nerve deficit.     Sensory: No sensory deficit.     Coordination: Coordination normal.  Psychiatric:        Speech: Speech normal.        Behavior: Behavior normal.        Thought Content: Thought content normal.     ED Results / Procedures / Treatments   Labs (all labs ordered are listed, but only abnormal results are displayed) Labs Reviewed    COMPREHENSIVE METABOLIC PANEL  CBC WITH DIFFERENTIAL/PLATELET  RETICULOCYTES  I-STAT BETA HCG BLOOD, ED ([facility], [facility], [facility] ONLY)    EKG None  Radiology No results found.  Procedures Procedures (including critical care time)  Medications Ordered in ED Medications  ketorolac (TORADOL) 15 MG/ML injection 15 mg (has no administration in time range)  HYDROmorphone (DILAUDID) injection 2 mg (has no administration in time range)  HYDROmorphone (DILAUDID) injection 2 mg (has no administration in time range)  diphenhydrAMINE (BENADRYL) injection 25 mg (has no administration in time range)  ondansetron (ZOFRAN) injection 4 mg (has no administration in time range)    ED Course  I have reviewed the triage vital signs and the nursing notes.  Pertinent labs & imaging results that were available during my care of the patient were reviewed by me and considered in my medical decision making (see chart for details).    MDM Rules/Calculators/A&P                          Patient presents to the emergency department for evaluation of sickle cell crisis.  Patient complaining of right leg pain and left thigh pain.  Patient saw her ophthalmologist and had a thorough eye exam earlier today.  No pathology was noted in the eye and she was referred to the ER for pain management of sickle cell crisis.  Patient will be treated with analgesia by protocol.  Will sign out to oncoming ER  physician.  Either discharge after analgesia if improved or consider transfer to sickle cell day hospital if not improved.  CRITICAL CARE Performed by: Orpah Greek   Total critical care time: 30 minutes  Critical care time was exclusive of separately billable procedures and treating other patients.  Critical care was necessary to treat or prevent imminent or life-threatening deterioration.  Critical care was time spent personally by me on the following activities: development of treatment plan with patient and/or  surrogate as well as nursing, discussions with consultants, evaluation of patient's response to treatment, examination of patient, obtaining history from patient or surrogate, ordering and performing treatments and interventions, ordering and review of laboratory studies, ordering and review of radiographic studies, pulse oximetry and re-evaluation of patient's condition.   Final Clinical Impression(s) / ED Diagnoses Final diagnoses:  Sickle cell crisis [facility])    Rx / DC Orders ED Discharge Orders    None       Donyea Beverlin, Gwenyth Allegra, MD 09/23/20 9983    Orpah Greek, MD 09/23/20 [phone]

## 2020-09-23 NOTE — H&P (Signed)
[facility] History and Physical   Date: 09/23/2020  Patient name: Hannah Hawkins Medical record number: [id number] Date of birth: 1986-02-02 Age: 34 y.o. Gender: female PCP: Vevelyn Francois, NP  Attending physician: Tresa Garter, MD  Chief Complaint: Sickle cell pain  History of Present Illness: Hannah Hawkins is a 34 year old female with a medical history significant for sickle cell disease type , chronic pain syndrome, opiate dependence and tolerance, history of cocaine abuse, and history of anemia of chronic disease presents complaining of right leg and left thigh pain that is consistent with sickle cell pain crisis.  Patient also endorses bilateral eye pain and was evaluated by Dr. Jalene Mullet on yesterday, and did not find abnormalities on exam.  Ophthalmologist felt that eye pain was related to sickle cell disease and patient was referred to the emergency department.  Agree with ER provider that patient was appropriate to transition to sickle cell day clinic for pain management and extended observation.  Pain intensity is 8/10 characterized as constant and throbbing.  Patient's pain persists despite IV Dilaudid, IV fluids, and IV Toradol.  Patient denies any fever, chills, shortness of breath, chest pain, urinary symptoms, nausea, vomiting, or diarrhea.  No sick contacts, recent travel, or exposure to COVID-19.  Meds: Medications Prior to Admission  Medication Sig Dispense Refill Last Dose  . ibuprofen (ADVIL) 800 MG tablet Take 800 mg by mouth 3 (three) times daily.   09/22/2020 at Unknown time  . montelukast (SINGULAIR) 10 MG tablet Take 1 tablet (10 mg total) by mouth at bedtime. 30 tablet 3 Past Week at Unknown time  . morphine (MS CONTIN) 30 MG 12 hr tablet Take 1 tablet (30 mg total) by mouth every 12 (twelve) hours. 60 tablet 0 09/22/2020 at Unknown time  . oxyCODONE (ROXICODONE) 15 MG immediate release tablet Take 1 tablet (15 mg total) by mouth  every 6 (six) hours as needed for up to 15 days for pain (moderate to severe pain). 60 tablet 0 09/22/2020 at Unknown time  . promethazine (PHENERGAN) 12.5 MG tablet Take 1 tablet (12.5 mg total) by mouth every 4 (four) hours as needed for nausea or vomiting. 30 tablet 0 09/22/2020 at Unknown time  . tiZANidine (ZANAFLEX) 2 MG tablet TAKE 1 TABLET(2 MG) BY MOUTH EVERY 8 HOURS AS NEEDED FOR MUSCLE SPASMS (Patient taking differently: Take 2 mg by mouth every 8 (eight) hours as needed for muscle spasms. ) 45 tablet 2 09/22/2020 at Unknown time  . albuterol (VENTOLIN HFA) 108 (90 Base) MCG/ACT inhaler Inhale 2 puffs into the lungs every 6 (six) hours as needed for wheezing or shortness of breath. (Patient not taking: Reported on 07/24/2020) 8 g 0   . citalopram (CELEXA) 20 MG tablet Take 1 tablet (20 mg total) by mouth daily. 30 tablet 11   . ergocalciferol (VITAMIN D2) 1.25 MG (50000 UT) capsule Take 1 capsule (50,000 Units total) by mouth once a week. (Patient not taking: Reported on 07/24/2020) 12 capsule 3   . Iron-Vitamins (GERITOL PO) Take 1 tablet by mouth daily.     Marland Kitchen levocetirizine (XYZAL) 5 MG tablet Take 5 mg by mouth every evening.     . lidocaine (LIDODERM) 5 % Place 1 patch onto the skin daily. Remove & Discard patch within 12 hours or as directed by MD (Patient taking differently: Place 1 patch onto the skin every 12 (twelve) hours as needed (pain). Remove & Discard patch within 12 hours or as  directed by MD) 30 patch 2   . NARCAN 4 MG/0.1ML LIQD nasal spray kit Place 4 mg into the nose once as needed (overdose).   0   . ondansetron (ZOFRAN) 4 MG tablet Take 4 mg by mouth every 8 (eight) hours as needed for nausea or vomiting. (Patient not taking: Reported on 09/23/2020)     . pantoprazole (PROTONIX) 40 MG tablet Take 1 tablet (40 mg total) by mouth daily. 30 tablet 3   . sucralfate (CARAFATE) 1 GM/10ML suspension Take 10 mLs (1 g total) by mouth 4 (four) times daily for 21 days. (Patient taking  differently: Take 1 g by mouth 4 (four) times daily as needed. Pt takes as needed for stomach pain) 420 mL 1     Allergies: Penicillins Past Medical History:  Diagnosis Date  . Sickle cell anemia ([facility])    Past Surgical History:  Procedure Laterality Date  . EYE SURGERY  03/09/2017   left eye  . EYE SURGERY Right 09/2019   right hemerage    Family History  Problem Relation Age of Onset  . Diabetes Father    Social History   Socioeconomic History  . Marital status: Single    Spouse name: Not on file  . Number of children: Not on file  . Years of education: Not on file  . Highest education level: Not on file  Occupational History  . Not on file  Tobacco Use  . Smoking status: Former Smoker    Types: Cigarettes  . Smokeless tobacco: Never Used  Vaping Use  . Vaping Use: Never used  Substance and Sexual Activity  . Alcohol use: Not Currently  . Drug use: No  . Sexual activity: Yes  Other Topics Concern  . Not on file  Social History Narrative  . Not on file   Social Determinants of Health   Financial Resource Strain:   . Difficulty of Paying Living Expenses: Not on file  Food Insecurity:   . Worried About Charity fundraiser in the Last Year: Not on file  . Ran Out of Food in the Last Year: Not on file  Transportation Needs:   . Lack of Transportation (Medical): Not on file  . Lack of Transportation (Non-Medical): Not on file  Physical Activity:   . Days of Exercise per Week: Not on file  . Minutes of Exercise per Session: Not on file  Stress:   . Feeling of Stress : Not on file  Social Connections:   . Frequency of Communication with Friends and Family: Not on file  . Frequency of Social Gatherings with Friends and Family: Not on file  . Attends Religious Services: Not on file  . Active Member of Clubs or Organizations: Not on file  . Attends Archivist Meetings: Not on file  . Marital Status: Not on file  Intimate Partner Violence:   . Fear  of Current or Ex-Partner: Not on file  . Emotionally Abused: Not on file  . Physically Abused: Not on file  . Sexually Abused: Not on file   Review of Systems  Constitutional: Negative.   HENT: Negative.   Eyes: Negative.   Respiratory: Negative.   Cardiovascular: Negative.   Gastrointestinal: Negative.   Genitourinary: Negative.   Musculoskeletal: Positive for back pain and joint pain.  Neurological: Negative.   Psychiatric/Behavioral: Negative.    Physical Exam: Blood pressure 131/82, pulse (!) 52, temperature (!) 97.4 F (36.3 C), temperature source Oral, resp. rate 14, SpO2  100 %.  Physical Exam HENT:     Mouth/Throat:     Mouth: Mucous membranes are moist.  Eyes:     Pupils: Pupils are equal, round, and reactive to light.  Cardiovascular:     Rate and Rhythm: Normal rate and regular rhythm.     Pulses: Normal pulses.  Pulmonary:     Effort: Pulmonary effort is normal.     Breath sounds: Normal breath sounds.  Abdominal:     General: Bowel sounds are normal.  Skin:    General: Skin is warm.  Neurological:     General: No focal deficit present.     Mental Status: Mental status is at baseline.  Psychiatric:        Mood and Affect: Mood normal.        Behavior: Behavior normal.        Thought Content: Thought content normal.     Lab results: Results for orders placed or performed during the hospital encounter of 09/23/20 (from the past 24 hour(s))  Comprehensive metabolic panel     Status: Abnormal   Collection Time: 09/23/20  6:58 AM  Result Value Ref Range   Sodium 136 135 - 145 mmol/L   Potassium 4.0 3.5 - 5.1 mmol/L   Chloride 102 98 - 111 mmol/L   CO2 28 22 - 32 mmol/L   Glucose, Bld 94 70 - 99 mg/dL   BUN 6 6 - 20 mg/dL   Creatinine, Ser 0.69 0.44 - 1.00 mg/dL   Calcium 8.7 (L) 8.9 - 10.3 mg/dL   Total Protein 6.9 6.5 - 8.1 g/dL   Albumin 3.9 3.5 - 5.0 g/dL   AST 33 15 - 41 U/L   ALT 17 0 - 44 U/L   Alkaline Phosphatase 52 38 - 126 U/L   Total  Bilirubin 0.8 0.3 - 1.2 mg/dL   GFR calc non Af Amer >60 >60 mL/min   GFR calc Af Amer >60 >60 mL/min   Anion gap 6 5 - 15  CBC with Differential     Status: Abnormal   Collection Time: 09/23/20  6:58 AM  Result Value Ref Range   WBC 9.8 4.0 - 10.5 K/uL   RBC 3.54 (L) 3.87 - 5.11 MIL/uL   Hemoglobin 9.7 (L) 12.0 - 15.0 g/dL   HCT 27.0 (L) 36 - 46 %   MCV 76.3 (L) 80.0 - 100.0 fL   MCH 27.4 26.0 - 34.0 pg   MCHC 35.9 30.0 - 36.0 g/dL   RDW 15.5 11.5 - 15.5 %   Platelets 394 150 - 400 K/uL   nRBC 7.8 (H) 0.0 - 0.2 %   Neutrophils Relative % 38 %   Neutro Abs 3.7 1.7 - 7.7 K/uL   Lymphocytes Relative 36 %   Lymphs Abs 3.5 0.7 - 4.0 K/uL   Monocytes Relative 9 %   Monocytes Absolute 0.9 0 - 1 K/uL   Eosinophils Relative 16 %   Eosinophils Absolute 1.6 (H) 0 - 0 K/uL   Basophils Relative 1 %   Basophils Absolute 0.1 0 - 0 K/uL   Immature Granulocytes 0 %   Abs Immature Granulocytes 0.04 0.00 - 0.07 K/uL   Tammy Sours Bodies PRESENT    Polychromasia PRESENT    Sickle Cells PRESENT    Target Cells PRESENT   Reticulocytes     Status: Abnormal   Collection Time: 09/23/20  6:58 AM  Result Value Ref Range   Retic Ct Pct 8.4 (H) 0.4 -  3.1 %   RBC. 3.51 (L) 3.87 - 5.11 MIL/uL   Retic Count, Absolute 265.0 (H) 19.0 - 186.0 K/uL   Immature Retic Fract 38.3 (H) 2.3 - 15.9 %  I-Stat beta hCG blood, ED     Status: None   Collection Time: 09/23/20  7:50 AM  Result Value Ref Range   I-stat hCG, quantitative <5.0 <5 mIU/mL   Comment 3            Imaging results:  No results found.   Assessment & Plan: Patient admitted to sickle cell day infusion clinic for pain management and extended observation.  Patient transition from emergency department in stable condition.  Pain persists despite IV Dilaudid and IV fluids.  Initiate IV Dilaudid PCA with settings of 0.5 mg, 10-minute lockout, and 3 mg/h. IV fluids, 0.45% saline at 100 mL/h IV Toradol 15 mg x 1 Tylenol 1000 mg x 1 Reviewed all  laboratory values, largely consistent with patient's baseline.  Do not warrant repeating at this time. Pain will be reevaluated in context of function and relationship to baseline as care progresses. If pain intensity remains elevated and/or hemodynamic stability changes, consider transitioning to inpatient services for higher level of care.   Donia Pounds  APRN, MSN, FNP-C [facility] [address] [place], [place] [address] [phone]  09/23/2020, 11:52 AM

## 2020-09-23 NOTE — Discharge Summary (Signed)
[facility] Discharge Summary   Patient ID: Hannah Hawkins MRN: [id number] DOB/AGE: 36-18-1987 36 y.o.  Admit date: 09/23/2020 Discharge date: 09/23/2020  Primary Care Physician:  Vevelyn Francois, NP  Admission Diagnoses:  Active Problems:   Sickle cell pain crisis [facility])   Discharge Medications:  Allergies as of 09/23/2020      Reactions   Penicillins Swelling, Rash   Has patient had a PCN reaction causing immediate rash, facial/tongue/throat swelling, SOB or lightheadedness with hypotension: Yes Has patient had a PCN reaction causing severe rash involving mucus membranes or skin necrosis: Yes Has patient had a PCN reaction that required hospitalization No Has patient had a PCN reaction occurring within the last 10 years: no If all of the above answers are "NO", then may proceed with Cephalosporin use.      Medication List    TAKE these medications   albuterol 108 (90 Base) MCG/ACT inhaler Commonly known as: VENTOLIN HFA Inhale 2 puffs into the lungs every 6 (six) hours as needed for wheezing or shortness of breath.   citalopram 20 MG tablet Commonly known as: CeleXA Take 1 tablet (20 mg total) by mouth daily.   ergocalciferol 1.25 MG (50000 UT) capsule Commonly known as: VITAMIN D2 Take 1 capsule (50,000 Units total) by mouth once a week.   GERITOL PO Take 1 tablet by mouth daily.   ibuprofen 800 MG tablet Commonly known as: ADVIL Take 800 mg by mouth 3 (three) times daily.   levocetirizine 5 MG tablet Commonly known as: XYZAL Take 5 mg by mouth every evening.   lidocaine 5 % Commonly known as: Lidoderm Place 1 patch onto the skin daily. Remove & Discard patch within 12 hours or as directed by MD What changed:   when to take this  reasons to take this   montelukast 10 MG tablet Commonly known as: SINGULAIR Take 1 tablet (10 mg total) by mouth at bedtime.   morphine 30 MG 12 hr tablet Commonly known as: MS CONTIN Take 1 tablet (30 mg  total) by mouth every 12 (twelve) hours.   Narcan 4 MG/0.1ML Liqd nasal spray kit Generic drug: naloxone Place 4 mg into the nose once as needed (overdose).   ondansetron 4 MG tablet Commonly known as: ZOFRAN Take 4 mg by mouth every 8 (eight) hours as needed for nausea or vomiting.   oxyCODONE 15 MG immediate release tablet Commonly known as: ROXICODONE Take 1 tablet (15 mg total) by mouth every 6 (six) hours as needed for up to 15 days for pain (moderate to severe pain).   pantoprazole 40 MG tablet Commonly known as: PROTONIX Take 1 tablet (40 mg total) by mouth daily.   promethazine 12.5 MG tablet Commonly known as: PHENERGAN Take 1 tablet (12.5 mg total) by mouth every 4 (four) hours as needed for nausea or vomiting.   sucralfate 1 GM/10ML suspension Commonly known as: Carafate Take 10 mLs (1 g total) by mouth 4 (four) times daily for 21 days. What changed:   when to take this  reasons to take this  additional instructions   tiZANidine 2 MG tablet Commonly known as: ZANAFLEX TAKE 1 TABLET(2 MG) BY MOUTH EVERY 8 HOURS AS NEEDED FOR MUSCLE SPASMS What changed: See the new instructions.        Consults:  None  Significant Diagnostic Studies:  No results found.  History of present illness:  Hannah Hawkins is a 34 year old female with a medical history significant for sickle  cell disease type [place], chronic pain syndrome, opiate dependence and tolerance, history of cocaine abuse, and history of anemia of chronic disease presents complaining of right leg and left thigh pain that is consistent with sickle cell pain crisis.  Patient also endorses bilateral eye pain and was evaluated by Dr. Jalene Mullet on yesterday, and did not find abnormalities on exam.  Ophthalmologist felt that eye pain was related to sickle cell disease and patient was referred to the emergency department.  Agree with ER provider that patient was appropriate to transition to sickle cell day clinic  for pain management and extended observation.  Pain intensity is 8/10 characterized as constant and throbbing.  Patient's pain persists despite IV Dilaudid, IV fluids, and IV Toradol.  Patient denies any fever, chills, shortness of breath, chest pain, urinary symptoms, nausea, vomiting, or diarrhea.  No sick contacts, recent travel, or exposure to COVID-19.   [facility] Course: Patient admitted to sickle cell day clinic for pain management and extended observation.  Patient was treated and evaluated this am in the ER, agreed with ER provider that patient was appropriate for transfer. Reviewed all laboratory values, consistent with patient's baseline.  IV dilaudid PCA initiated for greater pain control.  IV fluids, 0.45% saline at 100 ml/hr Pain intensity decreased to 5/10. [name] does not warrant admission on today.  Patient advised to resume home medications and increase rest and hydration.  Patient is alert, oriented, and ambulating without assistance. Patient will discharge home in a hemodynamically stable condition.    Discharge instructions:  Resume all home medications.   Follow up with PCP as previously  scheduled.   Discussed the importance of drinking 64 ounces of water daily, dehydration of red blood cells may lead further sickling.   Avoid all stressors that precipitate sickle cell pain crisis.     The patient was given clear instructions to go to ER or return to medical center if symptoms do not improve, worsen or new problems develop.    Physical Exam at Discharge:  BP 132/89 (BP Location: Left Arm)   Pulse (!) 50   Temp (!) 97.4 F (36.3 C) (Oral)   Resp 12   LMP 09/15/2020   SpO2 100%   Physical Exam Constitutional:      Appearance: Normal appearance.  Eyes:     Pupils: Pupils are equal, round, and reactive to light.  Cardiovascular:     Rate and Rhythm: Normal rate and regular rhythm.     Pulses: Normal pulses.  Pulmonary:     Effort:  Pulmonary effort is normal.  Abdominal:     General: Abdomen is flat. Bowel sounds are normal.  Skin:    General: Skin is warm.  Neurological:     General: No focal deficit present.     Mental Status: She is alert. Mental status is at baseline.  Psychiatric:        Mood and Affect: Mood normal.        Behavior: Behavior normal.        Thought Content: Thought content normal.        Judgment: Judgment normal.       Disposition at Discharge:   Discharge Orders:   Condition at Discharge:   Stable  Time spent on Discharge:  Greater than 30 minutes.  Signed: Donia Pounds  APRN, MSN, FNP-C [facility] [address] [place], [place] [address] [phone]  09/23/2020, 2:38 PM

## 2020-09-24 ENCOUNTER — Telehealth: Payer: Self-pay | Admitting: Nurse Practitioner

## 2020-09-24 NOTE — Telephone Encounter (Signed)
She needs a fu apt Thanks

## 2020-09-25 ENCOUNTER — Other Ambulatory Visit: Payer: Self-pay

## 2020-09-25 ENCOUNTER — Encounter: Payer: Self-pay | Admitting: Nurse Practitioner

## 2020-09-25 ENCOUNTER — Ambulatory Visit (INDEPENDENT_AMBULATORY_CARE_PROVIDER_SITE_OTHER): Payer: Medicaid Other | Admitting: Nurse Practitioner

## 2020-09-25 VITALS — BP 118/81 | HR 73 | Temp 98.4°F | Resp 17 | Ht 67.0 in | Wt 150.8 lb

## 2020-09-25 DIAGNOSIS — D5720 Sickle-cell/Hb-C disease without crisis: Secondary | ICD-10-CM

## 2020-09-25 DIAGNOSIS — Z79891 Long term (current) use of opiate analgesic: Secondary | ICD-10-CM | POA: Diagnosis not present

## 2020-09-25 DIAGNOSIS — G894 Chronic pain syndrome: Secondary | ICD-10-CM

## 2020-09-25 LAB — POCT URINALYSIS DIPSTICK
Bilirubin, UA: NEGATIVE
Blood, UA: NEGATIVE
Glucose, UA: NEGATIVE
Ketones, UA: NEGATIVE
Leukocytes, UA: NEGATIVE
Nitrite, UA: NEGATIVE
Protein, UA: NEGATIVE
Spec Grav, UA: 1.02 (ref 1.010–1.025)
Urobilinogen, UA: 1 E.U./dL
pH, UA: 7 (ref 5.0–8.0)

## 2020-09-25 MED ORDER — IBUPROFEN 800 MG PO TABS: 800.0000 mg | ORAL_TABLET | Freq: Three times a day (TID) | ORAL | 11 refills | Status: AC

## 2020-09-25 MED ORDER — OXYCODONE HCL 15 MG PO TABS: 15.0000 mg | ORAL_TABLET | Freq: Four times a day (QID) | ORAL | 0 refills | Status: DC | PRN

## 2020-09-25 MED ORDER — MORPHINE SULFATE ER 30 MG PO TBCR: 30.0000 mg | EXTENDED_RELEASE_TABLET | Freq: Two times a day (BID) | ORAL | 0 refills | Status: DC

## 2020-09-25 NOTE — Progress Notes (Signed)
[facility] [address] [place], [place]  [address] Phone:  [phone]   Fax:  [phone]   Established Patient Office Visit  Subjective:  Patient ID: Hannah Hawkins, female    DOB: May 04, 1986  Age: 34 y.o. MRN: [id number]  CC:  Chief Complaint  Patient presents with  . Follow-up    Pt states she is here for refill on her medication, but no question or concerns.    HPI Lyra Alaimo presents for follow-up. She  has a past medical history of Sickle cell anemia ([place]).   She is having upper back, left arm and both legs. She admits that it is pain that she can handle. She continues to have hand and ankle pain. Denies fever, headache, cough, wheezing, shortness of breath, chest pains, abdominal pain, back pain, hip pain, or leg pain. Denies any open wounds, skin irritation. She admits that she does have help.    Past Medical History:  Diagnosis Date  . Sickle cell anemia (HCC)     Past Surgical History:  Procedure Laterality Date  . EYE SURGERY  03/09/2017   left eye  . EYE SURGERY Right 09/2019   right hemerage     Family History  Problem Relation Age of Onset  . Diabetes Father     Social History   Socioeconomic History  . Marital status: Single    Spouse name: Not on file  . Number of children: Not on file  . Years of education: Not on file  . Highest education level: Not on file  Occupational History  . Not on file  Tobacco Use  . Smoking status: Former Smoker    Types: Cigarettes  . Smokeless tobacco: Never Used  Vaping Use  . Vaping Use: Never used  Substance and Sexual Activity  . Alcohol use: Not Currently  . Drug use: No  . Sexual activity: Yes  Other Topics Concern  . Not on file  Social History Narrative  . Not on file   Social Determinants of Health   Financial Resource Strain:   . Difficulty of Paying Living Expenses: Not on file  Food Insecurity:   . Worried About Charity fundraiser in the Last Year: Not  on file  . Ran Out of Food in the Last Year: Not on file  Transportation Needs:   . Lack of Transportation (Medical): Not on file  . Lack of Transportation (Non-Medical): Not on file  Physical Activity:   . Days of Exercise per Week: Not on file  . Minutes of Exercise per Session: Not on file  Stress:   . Feeling of Stress : Not on file  Social Connections:   . Frequency of Communication with Friends and Family: Not on file  . Frequency of Social Gatherings with Friends and Family: Not on file  . Attends Religious Services: Not on file  . Active Member of Clubs or Organizations: Not on file  . Attends Archivist Meetings: Not on file  . Marital Status: Not on file  Intimate Partner Violence:   . Fear of Current or Ex-Partner: Not on file  . Emotionally Abused: Not on file  . Physically Abused: Not on file  . Sexually Abused: Not on file    Outpatient Medications Prior to Visit  Medication Sig Dispense Refill  . citalopram (CELEXA) 20 MG tablet Take 1 tablet (20 mg total) by mouth daily. 30 tablet 11  . Iron-Vitamins (GERITOL PO) Take 1 tablet  by mouth daily.    Marland Kitchen levocetirizine (XYZAL) 5 MG tablet Take 5 mg by mouth every evening.    . lidocaine (LIDODERM) 5 % Place 1 patch onto the skin daily. Remove & Discard patch within 12 hours or as directed by MD (Patient taking differently: Place 1 patch onto the skin every 12 (twelve) hours as needed (pain). Remove & Discard patch within 12 hours or as directed by MD) 30 patch 2  . montelukast (SINGULAIR) 10 MG tablet Take 1 tablet (10 mg total) by mouth at bedtime. 30 tablet 3  . ondansetron (ZOFRAN) 4 MG tablet Take 4 mg by mouth every 8 (eight) hours as needed for nausea or vomiting.     . promethazine (PHENERGAN) 12.5 MG tablet Take 1 tablet (12.5 mg total) by mouth every 4 (four) hours as needed for nausea or vomiting. 30 tablet 0  . tiZANidine (ZANAFLEX) 2 MG tablet TAKE 1 TABLET(2 MG) BY MOUTH EVERY 8 HOURS AS NEEDED FOR  MUSCLE SPASMS (Patient taking differently: Take 2 mg by mouth every 8 (eight) hours as needed for muscle spasms. ) 45 tablet 2  . ibuprofen (ADVIL) 800 MG tablet Take 800 mg by mouth 3 (three) times daily.    Marland Kitchen morphine (MS CONTIN) 30 MG 12 hr tablet Take 1 tablet (30 mg total) by mouth every 12 (twelve) hours. 60 tablet 0  . oxyCODONE (ROXICODONE) 15 MG immediate release tablet Take 1 tablet (15 mg total) by mouth every 6 (six) hours as needed for up to 15 days for pain (moderate to severe pain). 60 tablet 0  . pantoprazole (PROTONIX) 40 MG tablet Take 1 tablet (40 mg total) by mouth daily. 30 tablet 3  . ergocalciferol (VITAMIN D2) 1.25 MG (50000 UT) capsule Take 1 capsule (50,000 Units total) by mouth once a week. (Patient not taking: Reported on 07/24/2020) 12 capsule 3  . NARCAN 4 MG/0.1ML LIQD nasal spray kit Place 4 mg into the nose once as needed (overdose).  (Patient not taking: Reported on 09/25/2020)  0  . albuterol (VENTOLIN HFA) 108 (90 Base) MCG/ACT inhaler Inhale 2 puffs into the lungs every 6 (six) hours as needed for wheezing or shortness of breath. (Patient not taking: Reported on 07/24/2020) 8 g 0  . sucralfate (CARAFATE) 1 GM/10ML suspension Take 10 mLs (1 g total) by mouth 4 (four) times daily for 21 days. (Patient taking differently: Take 1 g by mouth 4 (four) times daily as needed. Pt takes as needed for stomach pain) 420 mL 1   No facility-administered medications prior to visit.    Allergies  Allergen Reactions  . Penicillins Swelling and Rash    Has patient had a PCN reaction causing immediate rash, facial/tongue/throat swelling, SOB or lightheadedness with hypotension: Yes Has patient had a PCN reaction causing severe rash involving mucus membranes or skin necrosis: Yes Has patient had a PCN reaction that required hospitalization No Has patient had a PCN reaction occurring within the last 10 years: no If all of the above answers are "NO", then may proceed with Cephalosporin  use.     ROS Review of Systems  All other systems reviewed and are negative.     Objective:    Physical Exam Constitutional:      General: She is not in acute distress.    Appearance: She is normal weight. She is not ill-appearing, toxic-appearing or diaphoretic.  HENT:     Head: Normocephalic.     Nose:     Comments:  Nasal congestion    Mouth/Throat:     Mouth: Mucous membranes are moist.  Cardiovascular:     Rate and Rhythm: Normal rate and regular rhythm.     Pulses: Normal pulses.     Heart sounds: Normal heart sounds.  Pulmonary:     Effort: Pulmonary effort is normal.     Breath sounds: Normal breath sounds.  Abdominal:     Palpations: Abdomen is soft.  Musculoskeletal:     Cervical back: Normal range of motion.  Skin:    General: Skin is warm and dry.     Capillary Refill: Capillary refill takes less than 2 seconds.  Neurological:     General: No focal deficit present.     Mental Status: She is alert and oriented to person, place, and time.  Psychiatric:        Mood and Affect: Mood normal.        Behavior: Behavior normal.        Thought Content: Thought content normal.        Judgment: Judgment normal.     BP 118/81 (BP Location: Left Arm, Patient Position: Sitting, Cuff Size: Normal)   Pulse 73   Temp 98.4 F (36.9 C)   Resp 17   Ht 5' 7" (1.702 m)   Wt 150 lb 12.8 oz (68.4 kg)   LMP 09/15/2020 (Approximate)   SpO2 100%   BMI 23.62 kg/m  Wt Readings from Last 3 Encounters:  09/25/20 150 lb 12.8 oz (68.4 kg)  09/22/20 151 lb (68.5 kg)  08/08/20 148 lb (67.1 kg)     There are no preventive care reminders to display for this patient.  There are no preventive care reminders to display for this patient.  Lab Results  Component Value Date   TSH 1.940 07/24/2020   Lab Results  Component Value Date   WBC 9.8 09/23/2020   HGB 9.7 (L) 09/23/2020   HCT 27.0 (L) 09/23/2020   MCV 76.3 (L) 09/23/2020   PLT 394 09/23/2020   Lab Results    Component Value Date   NA 136 09/23/2020   K 4.0 09/23/2020   CO2 28 09/23/2020   GLUCOSE 94 09/23/2020   BUN 6 09/23/2020   CREATININE 0.69 09/23/2020   BILITOT 0.8 09/23/2020   ALKPHOS 52 09/23/2020   AST 33 09/23/2020   ALT 17 09/23/2020   PROT 6.9 09/23/2020   ALBUMIN 3.9 09/23/2020   CALCIUM 8.7 (L) 09/23/2020   ANIONGAP 6 09/23/2020   Lab Results  Component Value Date   CHOL 113 08/07/2020   Lab Results  Component Value Date   HDL 38 (L) 08/07/2020   Lab Results  Component Value Date   LDLCALC 65 08/07/2020   Lab Results  Component Value Date   TRIG 52 08/07/2020   Lab Results  Component Value Date   CHOLHDL 3.0 08/07/2020   No results found for: HGBA1C    Assessment & Plan:   Problem List Items Addressed This Visit      Other   Chronic pain syndrome - Primary   Relevant Medications   ibuprofen (ADVIL) 800 MG tablet   morphine (MS CONTIN) 30 MG 12 hr tablet (Start on 10/10/2020)   oxyCODONE (ROXICODONE) 15 MG immediate release tablet (Start on 09/28/2020)   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 Move frequently to reduce venous thromboembolism risk. Avoid situations that could lead to dehydration or could exacerbate pain Discussed S&S of infection, seizures, stroke acute chest, DVT  and how important it is to seek medical attention Take medication as directed along with pain contract and overall compliance Discussed the risk related to opiate use (addition, tolerance and dependency)   Relevant Medications   morphine (MS CONTIN) 30 MG 12 hr tablet (Start on 10/10/2020)   oxyCODONE (ROXICODONE) 15 MG immediate release tablet (Start on 09/28/2020)   Other Relevant Orders   Urinalysis Dipstick    Other Visit Diagnoses    Chronic prescription opiate use       Relevant Medications   morphine (MS CONTIN) 30 MG 12 hr tablet (Start on 10/10/2020)   Other Relevant Orders   [id number] 11+Oxyco+Alc+Crt-Bund      Meds ordered  this encounter  Medications  . ibuprofen (ADVIL) 800 MG tablet    Sig: Take 1 tablet (800 mg total) by mouth 3 (three) times daily.    Dispense:  90 tablet    Refill:  11    Order Specific Question:   Supervising Provider    Answer:   Tresa Garter [id number]  . morphine (MS CONTIN) 30 MG 12 hr tablet    Sig: Take 1 tablet (30 mg total) by mouth every 12 (twelve) hours.    Dispense:  60 tablet    Refill:  0    May fill on 10/10/20    Order Specific Question:   Supervising Provider    Answer:   Tresa Garter [[id number]]  . oxyCODONE (ROXICODONE) 15 MG immediate release tablet    Sig: Take 1 tablet (15 mg total) by mouth every 6 (six) hours as needed for up to 15 days for pain (moderate to severe pain).    Dispense:  60 tablet    Refill:  0    Order Specific Question:   Supervising Provider    Answer:   Tresa Garter [[id number]]    Follow-up: Return in about 2 months (around 11/25/2020).    Vevelyn Francois, NP

## 2020-09-28 LAB — DRUG SCREEN 764883 11+OXYCO+ALC+CRT-BUND

## 2020-09-30 LAB — DRUG SCREEN 764883 11+OXYCO+ALC+CRT-BUND
Amphetamines, Urine: NEGATIVE ng/mL
BENZODIAZ UR QL: NEGATIVE ng/mL
Barbiturate: NEGATIVE ng/mL
Cannabinoid Quant, Ur: NEGATIVE ng/mL
Cocaine (Metabolite): NEGATIVE ng/mL
Creatinine: 101.4 mg/dL (ref 20.0–300.0)
Ethanol: NEGATIVE %
Meperidine: NEGATIVE ng/mL
Methadone Screen, Urine: NEGATIVE ng/mL
Phencyclidine: NEGATIVE ng/mL
Propoxyphene: NEGATIVE ng/mL
Tramadol: NEGATIVE ng/mL
pH, Urine: 7.1 (ref 4.5–8.9)

## 2020-09-30 LAB — OXYCODONE/OXYMORPHONE, CONFIRM
OXYCODONE/OXYMORPH: POSITIVE — AB
OXYCODONE: 308 ng/mL
OXYCODONE: POSITIVE — AB
OXYMORPHONE (GC/MS): 2237 ng/mL
OXYMORPHONE: POSITIVE — AB

## 2020-09-30 LAB — OPIATES CONFIRMATION, URINE
Codeine: NEGATIVE
Hydrocodone: NEGATIVE
Hydromorphone Confirm: 768 ng/mL
Hydromorphone: POSITIVE — AB
Morphine Confirm: >3000 ng/mL
Morphine: POSITIVE — AB
Opiates: POSITIVE ng/mL — AB

## 2020-10-09 ENCOUNTER — Telehealth: Payer: Self-pay | Admitting: Nurse Practitioner

## 2020-10-09 NOTE — Telephone Encounter (Signed)
rx refill for oxycodone

## 2020-10-13 ENCOUNTER — Other Ambulatory Visit: Payer: Self-pay | Admitting: Nurse Practitioner

## 2020-10-13 ENCOUNTER — Telehealth: Payer: Self-pay | Admitting: Nurse Practitioner

## 2020-10-13 DIAGNOSIS — D5720 Sickle-cell/Hb-C disease without crisis: Secondary | ICD-10-CM

## 2020-10-13 DIAGNOSIS — G894 Chronic pain syndrome: Secondary | ICD-10-CM

## 2020-10-13 MED ORDER — OXYCODONE HCL 15 MG PO TABS
15.0000 mg | ORAL_TABLET | Freq: Four times a day (QID) | ORAL | 0 refills | Status: DC | PRN
Start: 2020-10-13 — End: 2020-10-24

## 2020-10-13 NOTE — Telephone Encounter (Signed)
Pt was called oto be informed that pcp completed med refill. Unable to lvm

## 2020-10-13 NOTE — Telephone Encounter (Signed)
Sent!

## 2020-10-20 ENCOUNTER — Other Ambulatory Visit: Payer: Self-pay | Admitting: Nurse Practitioner

## 2020-10-20 ENCOUNTER — Telehealth (HOSPITAL_COMMUNITY): Payer: Self-pay | Admitting: *Deleted

## 2020-10-20 ENCOUNTER — Telehealth: Payer: Self-pay | Admitting: Nurse Practitioner

## 2020-10-20 ENCOUNTER — Non-Acute Institutional Stay (HOSPITAL_COMMUNITY)
Admission: AD | Admit: 2020-10-20 | Discharge: 2020-10-20 | Disposition: A | Discharge status: Home / Self Care | Payer: Medicaid Other | Source: Ambulatory Visit | Attending: Internal Medicine | Admitting: Internal Medicine

## 2020-10-20 DIAGNOSIS — Z880 Allergy status to penicillin: Secondary | ICD-10-CM | POA: Insufficient documentation

## 2020-10-20 DIAGNOSIS — D5700 Hb-SS disease with crisis, unspecified: Secondary | ICD-10-CM | POA: Insufficient documentation

## 2020-10-20 LAB — RAPID URINE DRUG SCREEN, HOSP PERFORMED
Amphetamines: NOT DETECTED
Barbiturates: NOT DETECTED
Benzodiazepines: NOT DETECTED
Cocaine: NOT DETECTED
Opiates: POSITIVE — AB
Tetrahydrocannabinol: NOT DETECTED

## 2020-10-20 LAB — CBC WITH DIFFERENTIAL/PLATELET
Abs Immature Granulocytes: 0.03 10*3/uL (ref 0.00–0.07)
Basophils Absolute: 0.1 10*3/uL (ref 0.0–0.1)
Basophils Relative: 1 %
Eosinophils Absolute: 0.6 10*3/uL — ABNORMAL HIGH (ref 0.0–0.5)
Eosinophils Relative: 6 %
HCT: 31.5 % — ABNORMAL LOW (ref 36.0–46.0)
Hemoglobin: 11.6 g/dL — ABNORMAL LOW (ref 12.0–15.0)
Immature Granulocytes: 0 %
Lymphocytes Relative: 52 %
Lymphs Abs: 5.6 10*3/uL — ABNORMAL HIGH (ref 0.7–4.0)
MCH: 27.4 pg (ref 26.0–34.0)
MCHC: 36.8 g/dL — ABNORMAL HIGH (ref 30.0–36.0)
MCV: 74.3 fL — ABNORMAL LOW (ref 80.0–100.0)
Monocytes Absolute: 0.8 10*3/uL (ref 0.1–1.0)
Monocytes Relative: 8 %
Neutro Abs: 3.6 10*3/uL (ref 1.7–7.7)
Neutrophils Relative %: 33 %
Platelets: 376 10*3/uL (ref 150–400)
RBC: 4.24 MIL/uL (ref 3.87–5.11)
RDW: 13.8 % (ref 11.5–15.5)
WBC: 10.7 10*3/uL — ABNORMAL HIGH (ref 4.0–10.5)
nRBC: 0.8 % — ABNORMAL HIGH (ref 0.0–0.2)

## 2020-10-20 LAB — COMPREHENSIVE METABOLIC PANEL
ALT: 13 U/L (ref 0–44)
AST: 24 U/L (ref 15–41)
Albumin: 3.8 g/dL (ref 3.5–5.0)
Alkaline Phosphatase: 48 U/L (ref 38–126)
Anion gap: 10 (ref 5–15)
BUN: 9 mg/dL (ref 6–20)
CO2: 25 mmol/L (ref 22–32)
Calcium: 8.9 mg/dL (ref 8.9–10.3)
Chloride: 102 mmol/L (ref 98–111)
Creatinine, Ser: 0.92 mg/dL (ref 0.44–1.00)
GFR, Estimated: >60 mL/min (ref >60)
Glucose, Bld: 75 mg/dL (ref 70–99)
Potassium: 3.6 mmol/L (ref 3.5–5.1)
Sodium: 137 mmol/L (ref 135–145)
Total Bilirubin: 0.4 mg/dL (ref 0.3–1.2)
Total Protein: 7.3 g/dL (ref 6.5–8.1)

## 2020-10-20 LAB — RETICULOCYTES
Immature Retic Fract: 21.8 % — ABNORMAL HIGH (ref 2.3–15.9)
RBC.: 4.26 MIL/uL (ref 3.87–5.11)
Retic Count, Absolute: 123.1 10*3/uL (ref 19.0–186.0)
Retic Ct Pct: 2.9 % (ref 0.4–3.1)

## 2020-10-20 LAB — PREGNANCY, URINE: Preg Test, Ur: NEGATIVE

## 2020-10-20 MED ORDER — ONDANSETRON HCL 4 MG/2ML IJ SOLN: 4.0000 mg | Freq: Four times a day (QID) | INTRAMUSCULAR | Status: DC | PRN

## 2020-10-20 MED ORDER — HYDROMORPHONE 1 MG/ML IV SOLN
INTRAVENOUS | Status: DC
  Filled 2020-10-20: qty 25

## 2020-10-20 MED ORDER — SODIUM CHLORIDE 0.9% FLUSH: 9.0000 mL | INTRAVENOUS | Status: DC | PRN

## 2020-10-20 MED ORDER — NALOXONE HCL 0.4 MG/ML IJ SOLN: 0.4000 mg | INTRAMUSCULAR | Status: DC | PRN

## 2020-10-20 MED ORDER — SODIUM CHLORIDE 0.45 % IV SOLN: INTRAVENOUS | Status: DC

## 2020-10-20 MED ORDER — DIPHENHYDRAMINE HCL 25 MG PO CAPS
25.0000 mg | ORAL_CAPSULE | ORAL | Status: DC | PRN
  Administered 2020-10-20: 25 mg via ORAL
  Filled 2020-10-20 (×2): qty 1

## 2020-10-20 MED ORDER — KETOROLAC TROMETHAMINE 30 MG/ML IJ SOLN: 15.0000 mg | Freq: Once | INTRAMUSCULAR | Status: DC

## 2020-10-20 MED ORDER — ACETAMINOPHEN 500 MG PO TABS
1000.0000 mg | ORAL_TABLET | Freq: Once | ORAL | Status: AC
  Administered 2020-10-20: 1000 mg via ORAL
  Filled 2020-10-20: qty 2

## 2020-10-20 NOTE — Telephone Encounter (Signed)
Patient called requesting to come to the day hospital for sickle cell pain. Patient reports left hand and right leg pain rated 10/10. Reports taking Oxycodone and Ibuprofen at 3:00 am. COVID-19 screening done and patient admits to some coughing and runny nose but patient attributes this to allergies. Patient denies recent COVID test and exposure. Also denies fever, chest pain, nausea, vomiting, diarrhea and abdominal pain. [place], FNP notified. Patient can come to the day hospital for pain management. Patient advised and expresses an understanding.

## 2020-10-20 NOTE — Discharge Summary (Signed)
[facility] Discharge Summary   Patient ID: Licia Harl MRN: [id number] DOB/AGE: 1986-04-20 34 y.o.  Admit date: 10/20/2020 Discharge date: 10/20/2020  Primary Care Physician:  Vevelyn Francois, NP  Admission Diagnoses:  Active Problems:   Sickle cell pain crisis [facility])   Discharge Medications:  Allergies as of 10/20/2020      Reactions   Penicillins Swelling, Rash   Has patient had a PCN reaction causing immediate rash, facial/tongue/throat swelling, SOB or lightheadedness with hypotension: Yes Has patient had a PCN reaction causing severe rash involving mucus membranes or skin necrosis: Yes Has patient had a PCN reaction that required hospitalization No Has patient had a PCN reaction occurring within the last 10 years: no If all of the above answers are "NO", then may proceed with Cephalosporin use.      Medication List    TAKE these medications   citalopram 20 MG tablet Commonly known as: CeleXA Take 1 tablet (20 mg total) by mouth daily.   ergocalciferol 1.25 MG (50000 UT) capsule Commonly known as: VITAMIN D2 Take 1 capsule (50,000 Units total) by mouth once a week.   GERITOL PO Take 1 tablet by mouth daily.   ibuprofen 800 MG tablet Commonly known as: ADVIL Take 1 tablet (800 mg total) by mouth 3 (three) times daily.   levocetirizine 5 MG tablet Commonly known as: XYZAL Take 5 mg by mouth every evening.   lidocaine 5 % Commonly known as: Lidoderm Place 1 patch onto the skin daily. Remove & Discard patch within 12 hours or as directed by MD What changed:   when to take this  reasons to take this   montelukast 10 MG tablet Commonly known as: SINGULAIR Take 1 tablet (10 mg total) by mouth at bedtime.   morphine 30 MG 12 hr tablet Commonly known as: MS CONTIN Take 1 tablet (30 mg total) by mouth every 12 (twelve) hours.   Narcan 4 MG/0.1ML Liqd nasal spray kit Generic drug: naloxone Place 4 mg into the nose once as needed  (overdose).   ondansetron 4 MG tablet Commonly known as: ZOFRAN Take 4 mg by mouth every 8 (eight) hours as needed for nausea or vomiting.   oxyCODONE 15 MG immediate release tablet Commonly known as: ROXICODONE Take 1 tablet (15 mg total) by mouth every 6 (six) hours as needed for up to 15 days for pain (moderate to severe pain).   promethazine 12.5 MG tablet Commonly known as: PHENERGAN Take 1 tablet (12.5 mg total) by mouth every 4 (four) hours as needed for nausea or vomiting.   tiZANidine 2 MG tablet Commonly known as: ZANAFLEX TAKE 1 TABLET(2 MG) BY MOUTH EVERY 8 HOURS AS NEEDED FOR MUSCLE SPASMS What changed: See the new instructions.        Consults:  None  Significant Diagnostic Studies:  No results found.   History of present illness:  Hannah Hawkins is a 34 year old female with a medical history significant for sickle cell disease, chronic pain syndrome, opiate dependence, and tolerance and history of polysubstance abuse presents complaining of bilateral lower extremity pain that is consistent with typical pain crisis.  Patient states that pain intensity has been elevated over the past 3 days and has been unrelieved by home medications.  She last had oxycodone earlier this a.m. without sustained relief.  Pain is characterized as constant and throbbing.  Pain intensity is 10/10.  Patient denies any chest pain, shortness of breath, headache, urinary symptoms, nausea, vomiting,  or diarrhea.  No sick contacts, recent travel, or exposure to COVID-19.   [facility] Course: Patient admitted to sickle cell day infusion clinic for management of pain crisis. Reviewed all laboratory values, largely consistent with patient's baseline. Pain managed with IV Dilaudid via PCA with settings of 0.5 mg, 10-minute lockout, and 2 mg/h. IV fluids, 0.45% saline at 125 mL/h IV Toradol 15 mg x 1 Tylenol 1000 mg x 1 Pain intensity decreased to 5/10.  Patient is alert,  oriented, and ambulating without assistance.  She will discharge home in a hemodynamically stable stable condition.  No inpatient admission warranted at this time.  Discharge instructions: Resume all home medications.   Follow up with PCP as previously  scheduled.   Discussed the importance of drinking 64 ounces of water daily, dehydration of red blood cells may lead further sickling.   Avoid all stressors that precipitate sickle cell pain crisis.     The patient was given clear instructions to go to ER or return to medical center if symptoms do not improve, worsen or new problems develop.      Physical Exam at Discharge:  BP 111/76 (BP Location: Right Arm)   Pulse 66   Temp 97.8 F (36.6 C) (Temporal)   Resp 10   SpO2 100%   Physical Exam Constitutional:      Appearance: Normal appearance.  Eyes:     Pupils: Pupils are equal, round, and reactive to light.  Cardiovascular:     Rate and Rhythm: Normal rate and regular rhythm.     Pulses: Normal pulses.  Pulmonary:     Effort: Pulmonary effort is normal.  Abdominal:     General: Abdomen is flat. Bowel sounds are normal.  Musculoskeletal:        General: Normal range of motion.  Skin:    General: Skin is warm.  Neurological:     General: No focal deficit present.     Mental Status: She is alert. Mental status is at baseline.  Psychiatric:        Mood and Affect: Mood normal.        Behavior: Behavior normal.        Thought Content: Thought content normal.        Judgment: Judgment normal.      Disposition at Discharge: There are no questions and answers to display.        Discharge Orders:   Condition at Discharge:   Stable  Time spent on Discharge:  Greater than 30 minutes.  Signed: Donia Pounds  APRN, MSN, FNP-C [facility] [address] [place], [place] [address] [phone]  10/20/2020, 3:37 PM

## 2020-10-20 NOTE — H&P (Signed)
[facility] History and Physical   Date: 10/20/2020  Patient name: Hannah Hawkins Medical record number: [id number] Date of birth: 02/16/1986 Age: 34 y.o. Gender: female PCP: Vevelyn Francois, NP  Attending physician: Tresa Garter, MD  Chief Complaint: Sickle cell pain  History of Present Illness: Hannah Hawkins is a 34 year old female with a medical history significant for sickle cell disease, chronic pain syndrome, opiate dependence, and tolerance and history of polysubstance abuse presents complaining of bilateral lower extremity pain that is consistent with typical pain crisis.  Patient states that pain intensity has been elevated over the past 3 days and has been unrelieved by home medications.  She last had oxycodone earlier this a.m. without sustained relief.  Pain is characterized as constant and throbbing.  Pain intensity is 10/10.  Patient denies any chest pain, shortness of breath, headache, urinary symptoms, nausea, vomiting, or diarrhea.  No sick contacts, recent travel, or exposure to COVID-19.  Meds: Medications Prior to Admission  Medication Sig Dispense Refill Last Dose  . citalopram (CELEXA) 20 MG tablet Take 1 tablet (20 mg total) by mouth daily. 30 tablet 11   . ergocalciferol (VITAMIN D2) 1.25 MG (50000 UT) capsule Take 1 capsule (50,000 Units total) by mouth once a week. (Patient not taking: Reported on 07/24/2020) 12 capsule 3   . ibuprofen (ADVIL) 800 MG tablet Take 1 tablet (800 mg total) by mouth 3 (three) times daily. 90 tablet 11   . Iron-Vitamins (GERITOL PO) Take 1 tablet by mouth daily.     Marland Kitchen levocetirizine (XYZAL) 5 MG tablet Take 5 mg by mouth every evening.     . lidocaine (LIDODERM) 5 % Place 1 patch onto the skin daily. Remove & Discard patch within 12 hours or as directed by MD (Patient taking differently: Place 1 patch onto the skin every 12 (twelve) hours as needed (pain). Remove & Discard patch within 12 hours or as directed by  MD) 30 patch 2   . montelukast (SINGULAIR) 10 MG tablet Take 1 tablet (10 mg total) by mouth at bedtime. 30 tablet 3   . morphine (MS CONTIN) 30 MG 12 hr tablet Take 1 tablet (30 mg total) by mouth every 12 (twelve) hours. 60 tablet 0   . NARCAN 4 MG/0.1ML LIQD nasal spray kit Place 4 mg into the nose once as needed (overdose).  (Patient not taking: Reported on 09/25/2020)  0   . ondansetron (ZOFRAN) 4 MG tablet Take 4 mg by mouth every 8 (eight) hours as needed for nausea or vomiting.      Marland Kitchen oxyCODONE (ROXICODONE) 15 MG immediate release tablet Take 1 tablet (15 mg total) by mouth every 6 (six) hours as needed for up to 15 days for pain (moderate to severe pain). 60 tablet 0   . promethazine (PHENERGAN) 12.5 MG tablet Take 1 tablet (12.5 mg total) by mouth every 4 (four) hours as needed for nausea or vomiting. 30 tablet 0   . tiZANidine (ZANAFLEX) 2 MG tablet TAKE 1 TABLET(2 MG) BY MOUTH EVERY 8 HOURS AS NEEDED FOR MUSCLE SPASMS (Patient taking differently: Take 2 mg by mouth every 8 (eight) hours as needed for muscle spasms. ) 45 tablet 2     Allergies: Penicillins Past Medical History:  Diagnosis Date  . Sickle cell anemia (HCC)    Past Surgical History:  Procedure Laterality Date  . EYE SURGERY  03/09/2017   left eye  . EYE SURGERY Right 09/2019  right hemerage    Family History  Problem Relation Age of Onset  . Diabetes Father    Social History   Socioeconomic History  . Marital status: Single    Spouse name: Not on file  . Number of children: Not on file  . Years of education: Not on file  . Highest education level: Not on file  Occupational History  . Not on file  Tobacco Use  . Smoking status: Former Smoker    Types: Cigarettes  . Smokeless tobacco: Never Used  Vaping Use  . Vaping Use: Never used  Substance and Sexual Activity  . Alcohol use: Not Currently  . Drug use: No  . Sexual activity: Yes  Other Topics Concern  . Not on file  Social History Narrative   . Not on file   Social Determinants of Health   Financial Resource Strain:   . Difficulty of Paying Living Expenses: Not on file  Food Insecurity:   . Worried About Charity fundraiser in the Last Year: Not on file  . Ran Out of Food in the Last Year: Not on file  Transportation Needs:   . Lack of Transportation (Medical): Not on file  . Lack of Transportation (Non-Medical): Not on file  Physical Activity:   . Days of Exercise per Week: Not on file  . Minutes of Exercise per Session: Not on file  Stress:   . Feeling of Stress : Not on file  Social Connections:   . Frequency of Communication with Friends and Family: Not on file  . Frequency of Social Gatherings with Friends and Family: Not on file  . Attends Religious Services: Not on file  . Active Member of Clubs or Organizations: Not on file  . Attends Archivist Meetings: Not on file  . Marital Status: Not on file  Intimate Partner Violence:   . Fear of Current or Ex-Partner: Not on file  . Emotionally Abused: Not on file  . Physically Abused: Not on file  . Sexually Abused: Not on file   Review of Systems  Constitutional: Negative for chills and fever.  HENT: Negative.   Eyes: Negative.   Respiratory: Negative.   Cardiovascular: Negative.   Gastrointestinal: Negative.   Genitourinary: Negative.   Musculoskeletal: Positive for back pain and joint pain.  Skin: Negative.   Neurological: Negative.   Psychiatric/Behavioral: Negative.    Physical Exam: Blood pressure 130/70, pulse 68, temperature 97.8 F (36.6 C), temperature source Temporal, resp. rate 16, SpO2 100 %. Physical Exam Constitutional:      Appearance: Normal appearance.  HENT:     Mouth/Throat:     Mouth: Mucous membranes are moist.  Eyes:     Pupils: Pupils are equal, round, and reactive to light.  Cardiovascular:     Rate and Rhythm: Normal rate and regular rhythm.  Pulmonary:     Effort: Pulmonary effort is normal.  Abdominal:      General: Abdomen is flat. Bowel sounds are normal.     Palpations: Abdomen is soft.  Musculoskeletal:        General: Normal range of motion.  Skin:    General: Skin is warm.  Neurological:     General: No focal deficit present.     Mental Status: She is alert. Mental status is at baseline.  Psychiatric:        Mood and Affect: Mood normal.        Thought Content: Thought content normal.  Judgment: Judgment normal.      Lab results: No results found for this or any previous visit (from the past 24 hour(s)).  Imaging results:  No results found.   Assessment & Plan: Patient admitted to sickle cell day infusion center for management of pain crisis.  Patient is opiate tolerant Initiate IV dilaudid PCA. Settings of 0.5 mg, 10 minute lockout and 2 mg/hr IV fluids, 0.45% saline at 125 ml/hr Toradol 15 mg IV times one dose Tylenol 1000 mg by mouth times one dose Review CBC with differential, complete metabolic panel, and reticulocytes as results become available.  Pain intensity will be reevaluated in context of functioning and relationship to baseline as care progress  If pain intensity remains elevated and/or sudden change in hemodynamic stability transition to inpatient services for higher level of care.      Donia Pounds  APRN, MSN, FNP-C [facility] [address] [place], [place] [address] [phone]  10/20/2020, 11:58 AM

## 2020-10-20 NOTE — Progress Notes (Signed)
Patient admitted to the day hospital for treatment of sickle cell pain crisis. Patient reported generalized pain rated 10/10. Patient placed on Dilaudid PCA, given PO Benadryl, PO Tylenol  and hydrated with IV fluids. At discharge patient reported pain at 6/10. Discharge instructions given to patient. Patient alert, oriented and ambulatory at discharge.

## 2020-10-20 NOTE — Discharge Instructions (Signed)
Sickle Cell Anemia, Adult  Sickle cell anemia is a condition where your red blood cells are shaped like sickles. Red blood cells carry oxygen through the body. Sickle-shaped cells do not live as long as normal red blood cells. They also clump together and block blood from flowing through the blood vessels. This prevents the body from getting enough oxygen. Sickle cell anemia causes organ damage and pain. It also increases the risk of infection. Follow these instructions at home: Medicines  Take over-the-counter and prescription medicines only as told by your doctor.  If you were prescribed an antibiotic medicine, take it as told by your doctor. Do not stop taking the antibiotic even if you start to feel better.  If you develop a fever, do not take medicines to lower the fever right away. Tell your doctor about the fever. Managing pain, stiffness, and swelling  Try these methods to help with pain: ? Use a heating pad. ? Take a warm bath. ? Distract yourself, such as by watching TV. Eating and drinking  Drink enough fluid to keep your pee (urine) clear or pale yellow. Drink more in hot weather and during exercise.  Limit or avoid alcohol.  Eat a healthy diet. Eat plenty of fruits, vegetables, whole grains, and lean protein.  Take vitamins and supplements as told by your doctor. Traveling  When traveling, keep these with you: ? Your medical information. ? The names of your doctors. ? Your medicines.  If you need to take an airplane, talk to your doctor first. Activity  Rest often.  Avoid exercises that make your heart beat much faster, such as jogging. General instructions  Do not use products that have nicotine or tobacco, such as cigarettes and e-cigarettes. If you need help quitting, ask your doctor.  Consider wearing a medical alert bracelet.  Avoid being in high places (high altitudes), such as mountains.  Avoid very hot or cold temperatures.  Avoid places where the  temperature changes a lot.  Keep all follow-up visits as told by your doctor. This is important. Contact a doctor if:  A joint hurts.  Your feet or hands hurt or swell.  You feel tired (fatigued). Get help right away if:  You have symptoms of infection. These include: ? Fever. ? Chills. ? Being very tired. ? Irritability. ? Poor eating. ? Throwing up (vomiting).  You feel dizzy or faint.  You have new stomach pain, especially on the left side.  You have a an erection (priapism) that lasts more than 4 hours.  You have numbness in your arms or legs.  You have a hard time moving your arms or legs.  You have trouble talking.  You have pain that does not go away when you take medicine.  You are short of breath.  You are breathing fast.  You have a long-term cough.  You have pain in your chest.  You have a bad headache.  You have a stiff neck.  Your stomach looks bloated even though you did not eat much.  Your skin is pale.  You suddenly cannot see well. Summary  Sickle cell anemia is a condition where your red blood cells are shaped like sickles.  Follow your doctor's advice on ways to manage pain, food to eat, activities to do, and steps to take for safe travel.  Get medical help right away if you have any signs of infection, such as a fever. This information is not intended to replace advice given to you by   your health care provider. Make sure you discuss any questions you have with your health care provider. Document Revised: 03/30/2019 Document Reviewed: 01/11/2017 Elsevier Patient Education  2020 [employer]  

## 2020-10-21 ENCOUNTER — Other Ambulatory Visit: Payer: Self-pay | Admitting: Nurse Practitioner

## 2020-10-21 MED ORDER — TIZANIDINE HCL 2 MG PO TABS: ORAL_TABLET | ORAL | 2 refills | Status: DC

## 2020-10-21 NOTE — Telephone Encounter (Signed)
Unable to be sent due to patient being admitted to day hospital on yesterday.

## 2020-10-21 NOTE — Telephone Encounter (Signed)
Sent to provider 

## 2020-10-24 ENCOUNTER — Other Ambulatory Visit: Payer: Self-pay | Admitting: Nurse Practitioner

## 2020-10-24 ENCOUNTER — Telehealth: Payer: Self-pay | Admitting: Nurse Practitioner

## 2020-10-24 DIAGNOSIS — G894 Chronic pain syndrome: Secondary | ICD-10-CM

## 2020-10-24 DIAGNOSIS — D5720 Sickle-cell/Hb-C disease without crisis: Secondary | ICD-10-CM

## 2020-10-24 MED ORDER — OXYCODONE HCL 15 MG PO TABS: 15.0000 mg | ORAL_TABLET | Freq: Four times a day (QID) | ORAL | 0 refills | Status: DC | PRN

## 2020-10-24 NOTE — Telephone Encounter (Signed)
Sent!

## 2020-10-27 ENCOUNTER — Encounter (HOSPITAL_COMMUNITY): Payer: Self-pay | Admitting: Family Medicine

## 2020-10-27 ENCOUNTER — Telehealth (HOSPITAL_COMMUNITY): Payer: Self-pay

## 2020-10-27 ENCOUNTER — Non-Acute Institutional Stay (HOSPITAL_COMMUNITY)
Admission: AD | Admit: 2020-10-27 | Discharge: 2020-10-27 | Disposition: A | Discharge status: Home / Self Care | Payer: Medicaid Other | Source: Ambulatory Visit | Attending: Internal Medicine | Admitting: Internal Medicine

## 2020-10-27 DIAGNOSIS — G894 Chronic pain syndrome: Secondary | ICD-10-CM | POA: Diagnosis not present

## 2020-10-27 DIAGNOSIS — Z87891 Personal history of nicotine dependence: Secondary | ICD-10-CM | POA: Insufficient documentation

## 2020-10-27 DIAGNOSIS — Z791 Long term (current) use of non-steroidal anti-inflammatories (NSAID): Secondary | ICD-10-CM | POA: Diagnosis not present

## 2020-10-27 DIAGNOSIS — F1120 Opioid dependence, uncomplicated: Secondary | ICD-10-CM | POA: Diagnosis not present

## 2020-10-27 DIAGNOSIS — R52 Pain, unspecified: Secondary | ICD-10-CM | POA: Diagnosis present

## 2020-10-27 DIAGNOSIS — D5700 Hb-SS disease with crisis, unspecified: Secondary | ICD-10-CM

## 2020-10-27 DIAGNOSIS — Z79899 Other long term (current) drug therapy: Secondary | ICD-10-CM | POA: Diagnosis not present

## 2020-10-27 DIAGNOSIS — Z880 Allergy status to penicillin: Secondary | ICD-10-CM | POA: Diagnosis not present

## 2020-10-27 LAB — COMPREHENSIVE METABOLIC PANEL
ALT: 16 U/L (ref 0–44)
AST: 20 U/L (ref 15–41)
Albumin: 4.3 g/dL (ref 3.5–5.0)
Alkaline Phosphatase: 51 U/L (ref 38–126)
Anion gap: 7 (ref 5–15)
BUN: 11 mg/dL (ref 6–20)
CO2: 26 mmol/L (ref 22–32)
Calcium: 9.1 mg/dL (ref 8.9–10.3)
Chloride: 104 mmol/L (ref 98–111)
Creatinine, Ser: 0.76 mg/dL (ref 0.44–1.00)
GFR, Estimated: >60 mL/min (ref >60)
Glucose, Bld: 91 mg/dL (ref 70–99)
Potassium: 3.7 mmol/L (ref 3.5–5.1)
Sodium: 137 mmol/L (ref 135–145)
Total Bilirubin: 0.7 mg/dL (ref 0.3–1.2)
Total Protein: 7.8 g/dL (ref 6.5–8.1)

## 2020-10-27 LAB — CBC WITH DIFFERENTIAL/PLATELET
Abs Immature Granulocytes: 0.03 10*3/uL (ref 0.00–0.07)
Basophils Absolute: 0.1 10*3/uL (ref 0.0–0.1)
Basophils Relative: 1 %
Eosinophils Absolute: 0.3 10*3/uL (ref 0.0–0.5)
Eosinophils Relative: 3 %
HCT: 34.3 % — ABNORMAL LOW (ref 36.0–46.0)
Hemoglobin: 12.5 g/dL (ref 12.0–15.0)
Immature Granulocytes: 0 %
Lymphocytes Relative: 30 %
Lymphs Abs: 2.5 10*3/uL (ref 0.7–4.0)
MCH: 26.7 pg (ref 26.0–34.0)
MCHC: 36.4 g/dL — ABNORMAL HIGH (ref 30.0–36.0)
MCV: 73.1 fL — ABNORMAL LOW (ref 80.0–100.0)
Monocytes Absolute: 0.6 10*3/uL (ref 0.1–1.0)
Monocytes Relative: 7 %
Neutro Abs: 4.9 10*3/uL (ref 1.7–7.7)
Neutrophils Relative %: 59 %
Platelets: 550 10*3/uL — ABNORMAL HIGH (ref 150–400)
RBC: 4.69 MIL/uL (ref 3.87–5.11)
RDW: 13.9 % (ref 11.5–15.5)
WBC: 8.3 10*3/uL (ref 4.0–10.5)
nRBC: 1.3 % — ABNORMAL HIGH (ref 0.0–0.2)

## 2020-10-27 LAB — RETICULOCYTES
Immature Retic Fract: 26.6 % — ABNORMAL HIGH (ref 2.3–15.9)
RBC.: 4.67 MIL/uL (ref 3.87–5.11)
Retic Count, Absolute: 135.9 10*3/uL (ref 19.0–186.0)
Retic Ct Pct: 2.9 % (ref 0.4–3.1)

## 2020-10-27 MED ORDER — NALOXONE HCL 0.4 MG/ML IJ SOLN: 0.4000 mg | INTRAMUSCULAR | Status: DC | PRN

## 2020-10-27 MED ORDER — DIPHENHYDRAMINE HCL 25 MG PO CAPS
25.0000 mg | ORAL_CAPSULE | ORAL | Status: DC | PRN
  Administered 2020-10-27: 25 mg via ORAL
  Filled 2020-10-27: qty 1

## 2020-10-27 MED ORDER — ACETAMINOPHEN 325 MG PO TABS
325.0000 mg | ORAL_TABLET | Freq: Once | ORAL | Status: AC
  Administered 2020-10-27: 325 mg via ORAL
  Filled 2020-10-27: qty 1

## 2020-10-27 MED ORDER — HYDROMORPHONE 1 MG/ML IV SOLN
INTRAVENOUS | Status: DC
  Administered 2020-10-27: 11 mg via INTRAVENOUS
  Filled 2020-10-27: qty 25

## 2020-10-27 MED ORDER — DEXTROSE-NACL 5-0.45 % IV SOLN: INTRAVENOUS | Status: DC

## 2020-10-27 MED ORDER — KETOROLAC TROMETHAMINE 30 MG/ML IJ SOLN: 15.0000 mg | Freq: Once | INTRAMUSCULAR | Status: DC

## 2020-10-27 MED ORDER — SODIUM CHLORIDE 0.9% FLUSH: 9.0000 mL | INTRAVENOUS | Status: DC | PRN

## 2020-10-27 MED ORDER — ONDANSETRON HCL 4 MG/2ML IJ SOLN: 4.0000 mg | Freq: Four times a day (QID) | INTRAMUSCULAR | Status: DC | PRN

## 2020-10-27 NOTE — Discharge Summary (Signed)
[facility] Discharge Summary   Patient ID: Hannah Hawkins MRN: [id number] DOB/AGE: 1986-01-07 34 y.o.  Admit date: 10/27/2020 Discharge date: 10/27/2020  Primary Care Physician:  Vevelyn Francois, NP  Admission Diagnoses:  Active Problems:   Sickle cell pain crisis [facility])   Discharge Medications:  Allergies as of 10/27/2020      Reactions   Penicillins Swelling, Rash   Has patient had a PCN reaction causing immediate rash, facial/tongue/throat swelling, SOB or lightheadedness with hypotension: Yes Has patient had a PCN reaction causing severe rash involving mucus membranes or skin necrosis: Yes Has patient had a PCN reaction that required hospitalization No Has patient had a PCN reaction occurring within the last 10 years: no If all of the above answers are "NO", then may proceed with Cephalosporin use.      Medication List    TAKE these medications   citalopram 20 MG tablet Commonly known as: CeleXA Take 1 tablet (20 mg total) by mouth daily.   ergocalciferol 1.25 MG (50000 UT) capsule Commonly known as: VITAMIN D2 Take 1 capsule (50,000 Units total) by mouth once a week.   GERITOL PO Take 1 tablet by mouth daily.   ibuprofen 800 MG tablet Commonly known as: ADVIL Take 1 tablet (800 mg total) by mouth 3 (three) times daily.   levocetirizine 5 MG tablet Commonly known as: XYZAL Take 5 mg by mouth every evening.   lidocaine 5 % Commonly known as: Lidoderm Place 1 patch onto the skin daily. Remove & Discard patch within 12 hours or as directed by MD What changed:   when to take this  reasons to take this   montelukast 10 MG tablet Commonly known as: SINGULAIR Take 1 tablet (10 mg total) by mouth at bedtime.   morphine 30 MG 12 hr tablet Commonly known as: MS CONTIN Take 1 tablet (30 mg total) by mouth every 12 (twelve) hours.   Narcan 4 MG/0.1ML Liqd nasal spray kit Generic drug: naloxone Place 4 mg into the nose once as needed  (overdose).   ondansetron 4 MG tablet Commonly known as: ZOFRAN Take 4 mg by mouth every 8 (eight) hours as needed for nausea or vomiting.   oxyCODONE 15 MG immediate release tablet Commonly known as: ROXICODONE Take 1 tablet (15 mg total) by mouth every 6 (six) hours as needed for up to 15 days for pain (moderate to severe pain). Start taking on: October 28, 2020   promethazine 12.5 MG tablet Commonly known as: PHENERGAN Take 1 tablet (12.5 mg total) by mouth every 4 (four) hours as needed for nausea or vomiting.   tiZANidine 2 MG tablet Commonly known as: ZANAFLEX TAKE 1 TABLET(2 MG) BY MOUTH EVERY 8 HOURS AS NEEDED FOR MUSCLE SPASMS        Consults:  None  Significant Diagnostic Studies:  No results found.  History of present illness: Hannah Hawkins is a 34 year old female with a medical history significant for sickle cell disease, chronic pain syndrome, opiate dependence and tolerance, history of polysubstance abuse, history of vitamin D deficiency, and history of anemia of chronic disease presents complaining of generalized pain that is consistent with her typical pain crisis.  Patient states that pain intensity has been elevated over the past couple of days and has been unrelieved by home medications.  She attributes pain crisis to changes in weather.  Pain intensity is 9/10 characterized as constant and throbbing.  Patient last had oxycodone on yesterday around 1 PM.  She states that she is out of home medications and will not be able to pick up until tomorrow.  Patient denies any headache, chest pain, urinary symptoms, nausea, vomiting, or diarrhea. [facility] Course: Reviewed all laboratory values, largely within normal range. Pain managed with IV Dilaudid via PCA Toradol 15 mg IV x1 Tylenol 1000 mg x 1 IV fluids, 0.45% saline at 100 mL/h. Pain intensity decreased to 6/10.  Patient does not warrant inpatient admission.  She will discharge home in a  hemodynamically stable condition.  Patient advised to follow-up with PCP for medication management. She is alert, oriented, and ambulating without assistance.  Patient will discharge home in a hemodynamically stable condition.  Discharge instructions: Resume all home medications.   Follow up with PCP as previously  scheduled.   Discussed the importance of drinking 64 ounces of water daily, dehydration of red blood cells may lead further sickling.   Avoid all stressors that precipitate sickle cell pain crisis.     The patient was given clear instructions to go to ER or return to medical center if symptoms do not improve, worsen or new problems develop.    Physical Exam at Discharge:  BP (!) 126/91 (BP Location: Left Arm)   Pulse 72   Temp 97.9 F (36.6 C) (Temporal)   Resp 10   LMP 10/21/2020   SpO2 96%   Physical Exam Constitutional:      Appearance: Normal appearance.  Eyes:     Pupils: Pupils are equal, round, and reactive to light.  Cardiovascular:     Rate and Rhythm: Normal rate and regular rhythm.     Pulses: Normal pulses.  Pulmonary:     Effort: Pulmonary effort is normal.  Abdominal:     General: Abdomen is flat. Bowel sounds are normal.  Musculoskeletal:        General: Normal range of motion.  Skin:    General: Skin is warm.  Neurological:     General: No focal deficit present.     Mental Status: She is alert. Mental status is at baseline.  Psychiatric:        Mood and Affect: Mood normal.        Thought Content: Thought content normal.        Judgment: Judgment normal.      Disposition at Discharge: Discharge disposition: 01-Home or Self Care       Discharge Orders: Discharge Instructions    Discharge patient   Complete by: As directed    Discharge disposition: 01-Home or Self Care   Discharge patient date: 10/27/2020      Condition at Discharge:   Stable  Time spent on Discharge:  Greater than 30 minutes.  Signed: Donia Pounds  APRN, MSN, FNP-C [facility] [address] [place], [place] [address] [phone]  10/27/2020, 6:48 PM

## 2020-10-27 NOTE — H&P (Signed)
[facility] History and Physical   Date: 10/27/2020  Patient name: Hannah Hawkins Medical record number: [id number] Date of birth: 27-Mar-1986 Age: 34 y.o. Gender: female PCP: Vevelyn Francois, NP  Attending physician: Tresa Garter, MD  Chief Complaint: Sickle cell pain  History of Present Illness: Hannah Hawkins is a 34 year old female with a medical history significant for sickle cell disease, chronic pain syndrome, opiate dependence and tolerance, history of polysubstance abuse, history of vitamin D deficiency, and history of anemia of chronic disease presents complaining of generalized pain that is consistent with her typical pain crisis.  Patient states that pain intensity has been elevated over the past couple of days and has been unrelieved by home medications.  She attributes pain crisis to changes in weather.  Pain intensity is 9/10 characterized as constant and throbbing.  Patient last had oxycodone on yesterday around 1 PM.  She states that she is out of home medications and will not be able to pick up until tomorrow.  Patient denies any headache, chest pain, urinary symptoms, nausea, vomiting, or diarrhea.  Meds: Medications Prior to Admission  Medication Sig Dispense Refill Last Dose  . citalopram (CELEXA) 20 MG tablet Take 1 tablet (20 mg total) by mouth daily. 30 tablet 11   . ergocalciferol (VITAMIN D2) 1.25 MG (50000 UT) capsule Take 1 capsule (50,000 Units total) by mouth once a week. (Patient not taking: Reported on 07/24/2020) 12 capsule 3   . ibuprofen (ADVIL) 800 MG tablet Take 1 tablet (800 mg total) by mouth 3 (three) times daily. 90 tablet 11   . Iron-Vitamins (GERITOL PO) Take 1 tablet by mouth daily.     Marland Kitchen levocetirizine (XYZAL) 5 MG tablet Take 5 mg by mouth every evening.     . lidocaine (LIDODERM) 5 % Place 1 patch onto the skin daily. Remove & Discard patch within 12 hours or as directed by MD (Patient taking differently: Place 1 patch onto  the skin every 12 (twelve) hours as needed (pain). Remove & Discard patch within 12 hours or as directed by MD) 30 patch 2   . montelukast (SINGULAIR) 10 MG tablet Take 1 tablet (10 mg total) by mouth at bedtime. 30 tablet 3   . morphine (MS CONTIN) 30 MG 12 hr tablet Take 1 tablet (30 mg total) by mouth every 12 (twelve) hours. 60 tablet 0   . NARCAN 4 MG/0.1ML LIQD nasal spray kit Place 4 mg into the nose once as needed (overdose).  (Patient not taking: Reported on 09/25/2020)  0   . ondansetron (ZOFRAN) 4 MG tablet Take 4 mg by mouth every 8 (eight) hours as needed for nausea or vomiting.      Derrill Memo ON 10/28/2020] oxyCODONE (ROXICODONE) 15 MG immediate release tablet Take 1 tablet (15 mg total) by mouth every 6 (six) hours as needed for up to 15 days for pain (moderate to severe pain). 60 tablet 0   . promethazine (PHENERGAN) 12.5 MG tablet Take 1 tablet (12.5 mg total) by mouth every 4 (four) hours as needed for nausea or vomiting. 30 tablet 0   . tiZANidine (ZANAFLEX) 2 MG tablet TAKE 1 TABLET(2 MG) BY MOUTH EVERY 8 HOURS AS NEEDED FOR MUSCLE SPASMS 45 tablet 2     Allergies: Penicillins Past Medical History:  Diagnosis Date  . Sickle cell anemia (HCC)    Past Surgical History:  Procedure Laterality Date  . EYE SURGERY  03/09/2017   left eye  .  EYE SURGERY Right 09/2019   right hemerage    Family History  Problem Relation Age of Onset  . Diabetes Father    Social History   Socioeconomic History  . Marital status: Single    Spouse name: Not on file  . Number of children: Not on file  . Years of education: Not on file  . Highest education level: Not on file  Occupational History  . Not on file  Tobacco Use  . Smoking status: Former Smoker    Types: Cigarettes  . Smokeless tobacco: Never Used  Vaping Use  . Vaping Use: Never used  Substance and Sexual Activity  . Alcohol use: Not Currently  . Drug use: No  . Sexual activity: Yes  Other Topics Concern  . Not on file   Social History Narrative  . Not on file   Social Determinants of Health   Financial Resource Strain:   . Difficulty of Paying Living Expenses: Not on file  Food Insecurity:   . Worried About Charity fundraiser in the Last Year: Not on file  . Ran Out of Food in the Last Year: Not on file  Transportation Needs:   . Lack of Transportation (Medical): Not on file  . Lack of Transportation (Non-Medical): Not on file  Physical Activity:   . Days of Exercise per Week: Not on file  . Minutes of Exercise per Session: Not on file  Stress:   . Feeling of Stress : Not on file  Social Connections:   . Frequency of Communication with Friends and Family: Not on file  . Frequency of Social Gatherings with Friends and Family: Not on file  . Attends Religious Services: Not on file  . Active Member of Clubs or Organizations: Not on file  . Attends Archivist Meetings: Not on file  . Marital Status: Not on file  Intimate Partner Violence:   . Fear of Current or Ex-Partner: Not on file  . Emotionally Abused: Not on file  . Physically Abused: Not on file  . Sexually Abused: Not on file   Review of Systems  Constitutional: Negative for chills and fever.  HENT: Negative.   Eyes: Negative.   Respiratory: Negative.   Cardiovascular: Negative.   Gastrointestinal: Negative.   Genitourinary: Negative.   Musculoskeletal: Positive for back pain and joint pain.  Skin: Negative.   Neurological: Negative.   Endo/Heme/Allergies: Negative.   Psychiatric/Behavioral: Negative.     Physical Exam: There were no vitals taken for this visit. Physical Exam Constitutional:      Appearance: Normal appearance.  Eyes:     Pupils: Pupils are equal, round, and reactive to light.  Cardiovascular:     Rate and Rhythm: Normal rate and regular rhythm.     Pulses: Normal pulses.  Pulmonary:     Effort: Pulmonary effort is normal.     Breath sounds: Normal breath sounds.  Abdominal:     General:  Bowel sounds are normal.  Skin:    General: Skin is warm.  Neurological:     General: No focal deficit present.     Mental Status: She is alert. Mental status is at baseline.  Psychiatric:        Mood and Affect: Mood normal.        Behavior: Behavior normal.        Thought Content: Thought content normal.        Judgment: Judgment normal.      Lab results: No  results found for this or any previous visit (from the past 24 hour(s)).  Imaging results:  No results found.   Assessment & Plan: Patient admitted to sickle cell day infusion center for management of pain crisis.  Patient is opiate tolerant Initiate IV dilaudid PCA. Settings of 0.5 mg, 10 minute lockout, and 2 mg/hr IV fluids, 0.45% saline at 100 ml/hr Toradol 15 mg IV times one dose Tylenol 1000 mg by mouth times one dose Review CBC with differential, complete metabolic panel, and reticulocytes as results become available. Baseline hemoglobin is 10-11 Pain intensity will be reevaluated in context of functioning and relationship to baseline as care progress If pain intensity remains elevated and/or sudden change in hemodynamic stability transition to inpatient services for higher level of care.      Donia Pounds  APRN, MSN, FNP-C [facility] [address] [place], [place] [address] [phone]  10/27/2020, 9:48 AM

## 2020-10-27 NOTE — Discharge Instructions (Signed)
Sickle Cell Anemia, Adult  Sickle cell anemia is a condition where your red blood cells are shaped like sickles. Red blood cells carry oxygen through the body. Sickle-shaped cells do not live as long as normal red blood cells. They also clump together and block blood from flowing through the blood vessels. This prevents the body from getting enough oxygen. Sickle cell anemia causes organ damage and pain. It also increases the risk of infection. Follow these instructions at home: Medicines  Take over-the-counter and prescription medicines only as told by your doctor.  If you were prescribed an antibiotic medicine, take it as told by your doctor. Do not stop taking the antibiotic even if you start to feel better.  If you develop a fever, do not take medicines to lower the fever right away. Tell your doctor about the fever. Managing pain, stiffness, and swelling  Try these methods to help with pain: ? Use a heating pad. ? Take a warm bath. ? Distract yourself, such as by watching TV. Eating and drinking  Drink enough fluid to keep your pee (urine) clear or pale yellow. Drink more in hot weather and during exercise.  Limit or avoid alcohol.  Eat a healthy diet. Eat plenty of fruits, vegetables, whole grains, and lean protein.  Take vitamins and supplements as told by your doctor. Traveling  When traveling, keep these with you: ? Your medical information. ? The names of your doctors. ? Your medicines.  If you need to take an airplane, talk to your doctor first. Activity  Rest often.  Avoid exercises that make your heart beat much faster, such as jogging. General instructions  Do not use products that have nicotine or tobacco, such as cigarettes and e-cigarettes. If you need help quitting, ask your doctor.  Consider wearing a medical alert bracelet.  Avoid being in high places (high altitudes), such as mountains.  Avoid very hot or cold temperatures.  Avoid places where the  temperature changes a lot.  Keep all follow-up visits as told by your doctor. This is important. Contact a doctor if:  A joint hurts.  Your feet or hands hurt or swell.  You feel tired (fatigued). Get help right away if:  You have symptoms of infection. These include: ? Fever. ? Chills. ? Being very tired. ? Irritability. ? Poor eating. ? Throwing up (vomiting).  You feel dizzy or faint.  You have new stomach pain, especially on the left side.  You have a an erection (priapism) that lasts more than 4 hours.  You have numbness in your arms or legs.  You have a hard time moving your arms or legs.  You have trouble talking.  You have pain that does not go away when you take medicine.  You are short of breath.  You are breathing fast.  You have a long-term cough.  You have pain in your chest.  You have a bad headache.  You have a stiff neck.  Your stomach looks bloated even though you did not eat much.  Your skin is pale.  You suddenly cannot see well. Summary  Sickle cell anemia is a condition where your red blood cells are shaped like sickles.  Follow your doctor's advice on ways to manage pain, food to eat, activities to do, and steps to take for safe travel.  Get medical help right away if you have any signs of infection, such as a fever. This information is not intended to replace advice given to you by   your health care provider. Make sure you discuss any questions you have with your health care provider. Document Revised: 03/30/2019 Document Reviewed: 01/11/2017 Elsevier Patient Education  2020 [employer]  

## 2020-10-27 NOTE — Telephone Encounter (Signed)
Pt called requesting to receive treatment in the day hospital today. Pt complains of pain related to sickle cell disease, rating pain a 10/10 in her R leg that began overnight. Pt took Oxycodone 15mg  and Ibuprofen 800mg  yesterday ( 11/7) and states that she ran out of pain medication. Pt denies fever, chest pain, NVD, and abdominal pain. Pt screened for covid 19, denies any flu like symptoms. Pt states she has transport other than herself to center. FNP notified, approved for pt to come to center today and instructed for pt to hydrate with 64oz water daily, and to alternate tylenol and ibuprofen when at home , refills for pain medication will not be filled at this time. Pt made aware, verbalized understanding.

## 2020-10-27 NOTE — Progress Notes (Signed)
Patient admitted to the day infusion hospital for sickle cell pain. Initially, patient reported right leg and left hand pain rated 10/10. For pain management, patient placed on Dilaudid PCA, given Tylenol and hydrated with IV fluids. At discharge, patient rated pain at 6/10. Vital signs stable. Discharge instructions given. Patient alert, oriented and ambulatory at discharge.

## 2020-11-10 ENCOUNTER — Telehealth: Payer: Self-pay | Admitting: Nurse Practitioner

## 2020-11-10 ENCOUNTER — Other Ambulatory Visit: Payer: Self-pay | Admitting: Nurse Practitioner

## 2020-11-10 DIAGNOSIS — D5720 Sickle-cell/Hb-C disease without crisis: Secondary | ICD-10-CM

## 2020-11-10 DIAGNOSIS — G894 Chronic pain syndrome: Secondary | ICD-10-CM

## 2020-11-10 MED ORDER — OXYCODONE HCL 15 MG PO TABS: 15.0000 mg | ORAL_TABLET | Freq: Four times a day (QID) | ORAL | 0 refills | Status: DC | PRN

## 2020-11-10 NOTE — Telephone Encounter (Signed)
sent 

## 2020-11-18 ENCOUNTER — Telehealth: Payer: Self-pay

## 2020-11-18 NOTE — Telephone Encounter (Signed)
Called Medicaid PA dept to check status of PA for Oxycodone, they reported that med is approved for any strength from 09/19/20-03/18/21, pharmacy given information but stated that Medicaid is telling them that medication is not approved, pharmacy will follow-up w/ Medicaid

## 2020-11-29 ENCOUNTER — Observation Stay (HOSPITAL_COMMUNITY)
Admission: EM | Admit: 2020-11-29 | Discharge: 2020-12-01 | Disposition: A | Discharge status: Home / Self Care | Payer: Medicaid Other | Attending: Internal Medicine | Admitting: Internal Medicine

## 2020-11-29 ENCOUNTER — Other Ambulatory Visit: Payer: Self-pay

## 2020-11-29 ENCOUNTER — Encounter (HOSPITAL_COMMUNITY): Payer: Self-pay

## 2020-11-29 DIAGNOSIS — D57219 Sickle-cell/Hb-C disease with crisis, unspecified: Principal | ICD-10-CM | POA: Insufficient documentation

## 2020-11-29 DIAGNOSIS — D5700 Hb-SS disease with crisis, unspecified: Secondary | ICD-10-CM

## 2020-11-29 DIAGNOSIS — Z87891 Personal history of nicotine dependence: Secondary | ICD-10-CM | POA: Insufficient documentation

## 2020-11-29 DIAGNOSIS — G894 Chronic pain syndrome: Secondary | ICD-10-CM | POA: Diagnosis not present

## 2020-11-29 DIAGNOSIS — Z20822 Contact with and (suspected) exposure to covid-19: Secondary | ICD-10-CM | POA: Diagnosis not present

## 2020-11-29 DIAGNOSIS — K219 Gastro-esophageal reflux disease without esophagitis: Secondary | ICD-10-CM | POA: Diagnosis present

## 2020-11-29 LAB — CBC WITH DIFFERENTIAL/PLATELET
Abs Immature Granulocytes: 0.02 10*3/uL (ref 0.00–0.07)
Basophils Absolute: 0.1 10*3/uL (ref 0.0–0.1)
Basophils Relative: 1 %
Eosinophils Absolute: 0.3 10*3/uL (ref 0.0–0.5)
Eosinophils Relative: 5 %
HCT: 33.5 % — ABNORMAL LOW (ref 36.0–46.0)
Hemoglobin: 12 g/dL (ref 12.0–15.0)
Immature Granulocytes: 0 %
Lymphocytes Relative: 36 %
Lymphs Abs: 2.3 10*3/uL (ref 0.7–4.0)
MCH: 26.4 pg (ref 26.0–34.0)
MCHC: 35.8 g/dL (ref 30.0–36.0)
MCV: 73.8 fL — ABNORMAL LOW (ref 80.0–100.0)
Monocytes Absolute: 0.5 10*3/uL (ref 0.1–1.0)
Monocytes Relative: 7 %
Neutro Abs: 3.2 10*3/uL (ref 1.7–7.7)
Neutrophils Relative %: 51 %
Platelets: 461 10*3/uL — ABNORMAL HIGH (ref 150–400)
RBC: 4.54 MIL/uL (ref 3.87–5.11)
RDW: 13.7 % (ref 11.5–15.5)
WBC: 6.3 10*3/uL (ref 4.0–10.5)
nRBC: 0 % (ref 0.0–0.2)

## 2020-11-29 LAB — COMPREHENSIVE METABOLIC PANEL
ALT: 13 U/L (ref 0–44)
AST: 21 U/L (ref 15–41)
Albumin: 4.2 g/dL (ref 3.5–5.0)
Alkaline Phosphatase: 52 U/L (ref 38–126)
Anion gap: 9 (ref 5–15)
BUN: 9 mg/dL (ref 6–20)
CO2: 26 mmol/L (ref 22–32)
Calcium: 9.2 mg/dL (ref 8.9–10.3)
Chloride: 103 mmol/L (ref 98–111)
Creatinine, Ser: 0.81 mg/dL (ref 0.44–1.00)
GFR, Estimated: >60 mL/min (ref >60)
Glucose, Bld: 108 mg/dL — ABNORMAL HIGH (ref 70–99)
Potassium: 4.1 mmol/L (ref 3.5–5.1)
Sodium: 138 mmol/L (ref 135–145)
Total Bilirubin: 0.8 mg/dL (ref 0.3–1.2)
Total Protein: 7.7 g/dL (ref 6.5–8.1)

## 2020-11-29 LAB — I-STAT BETA HCG BLOOD, ED (MC, WL, AP ONLY): I-stat hCG, quantitative: <5 m[IU]/mL (ref <5)

## 2020-11-29 LAB — RETICULOCYTES
Immature Retic Fract: 21.4 % — ABNORMAL HIGH (ref 2.3–15.9)
RBC.: 4.49 MIL/uL (ref 3.87–5.11)
Retic Count, Absolute: 128.9 10*3/uL (ref 19.0–186.0)
Retic Ct Pct: 2.9 % (ref 0.4–3.1)

## 2020-11-29 MED ORDER — SODIUM CHLORIDE 0.45 % IV SOLN: INTRAVENOUS | Status: DC

## 2020-11-29 MED ORDER — HYDROMORPHONE HCL 2 MG/ML IJ SOLN
2.0000 mg | INTRAMUSCULAR | Status: AC
  Administered 2020-11-30: 2 mg via INTRAVENOUS
  Filled 2020-11-29: qty 1

## 2020-11-29 MED ORDER — DIPHENHYDRAMINE HCL 25 MG PO CAPS
25.0000 mg | ORAL_CAPSULE | ORAL | Status: DC | PRN
  Administered 2020-11-29: 25 mg via ORAL
  Filled 2020-11-29: qty 1

## 2020-11-29 MED ORDER — HYDROMORPHONE HCL 2 MG/ML IJ SOLN
2.0000 mg | INTRAMUSCULAR | Status: AC
  Administered 2020-11-29: 2 mg via INTRAVENOUS
  Filled 2020-11-29: qty 1

## 2020-11-29 MED ORDER — ONDANSETRON 8 MG PO TBDP
8.0000 mg | ORAL_TABLET | Freq: Three times a day (TID) | ORAL | Status: DC | PRN
  Administered 2020-11-29: 8 mg via ORAL
  Filled 2020-11-29: qty 1

## 2020-11-29 NOTE — ED Triage Notes (Signed)
Pt arrives with c/o sickle cell pain getting worse yesterday. Pt takes morphine and oxycodone for breakthrough pain.

## 2020-11-29 NOTE — ED Provider Notes (Signed)
[facility] DEPT Provider Note   CSN: [id number] Arrival date & time: 11/29/20  1921     History Chief Complaint  Patient presents with  . Sickle Cell Pain Crisis    Hannah Hawkins is a 34 y.o. female.  Patient presents to the emergency department with a chief complaint of sickle cell pain. She states that the pain started about 3 days ago. It has been progressively worsening. She has been taking her home pain meds without any relief. She states that she has now run out of her morphine and oxycodone. She denies any fevers or chills. Denies any cough. Denies vomiting. She states she has had some diarrhea. She states that the pain is mostly located in her joints. She denies any chest pain or shortness of breath. She was last admitted to the sickle cell clinic about a month ago.  The history is provided by the patient. No language interpreter was used.       Past Medical History:  Diagnosis Date  . Sickle cell anemia [facility])     Patient Active Problem List   Diagnosis Date Noted  . Epigastric pain   . Lactic acidosis   . RUQ abdominal pain   . Rhabdomyolysis 08/07/2020  . Sickle cell crisis ([place]) 08/06/2020  . Elevated LFTs 08/06/2020  . 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 Leukocytosis 08/06/2020  . SIRS (systemic inflammatory response syndrome) ([place]) 08/06/2020  . Non-intractable vomiting   . Joint pain in fingers of both hands 06/11/2020  . Hypokalemia 07/17/2019  . GERD (gastroesophageal reflux disease) 05/29/2019  . History of cocaine use 05/18/2019  . Chronic pain syndrome   . Cocaine use   . Sickle cell pain crisis ([place]) 11/14/2017  . Anemia of chronic disease 11/13/2017  . Sinusitis, chronic 11/13/2017  . 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 EYE SURGERY  03/09/2017   left eye  . EYE SURGERY Right 09/2019   right hemerage      OB History    Gravida  2   Para       Term      Preterm      AB      Living  1     SAB      IAB      Ectopic      Multiple      Live Births              Family History  Problem Relation Age of Onset  . Diabetes Father     Social History   Tobacco Use  . Smoking status: Former Smoker    Types: Cigarettes  . Smokeless tobacco: Never Used  Vaping Use  . Vaping Use: Never used  Substance Use Topics  . Alcohol use: Not Currently  . Drug use: No    Home Medications Prior to Admission medications   Medication Sig Start Date End Date Taking? Authorizing Provider  citalopram (CELEXA) 20 MG tablet Take 1 tablet (20 mg total) by mouth daily. 09/03/20 09/03/21  Vevelyn Francois, NP  ergocalciferol (VITAMIN D2) 1.25 MG (50000 UT) capsule Take 1 capsule (50,000 Units total) by mouth once a week. Patient not taking: Reported on 07/24/2020 05/10/19   Lanae Boast, FNP  ibuprofen (ADVIL) 800 MG tablet Take 1 tablet (800 mg total) by mouth 3 (three) times daily. 09/25/20 09/25/21  Vevelyn Francois, NP  Iron-Vitamins (GERITOL PO) Take 1 tablet  by mouth daily.    [provider]  levocetirizine (XYZAL) 5 MG tablet Take 5 mg by mouth every evening.    [provider]  lidocaine (LIDODERM) 5 % Place 1 patch onto the skin daily. Remove & Discard patch within 12 hours or as directed by MD Patient taking differently: Place 1 patch onto the skin every 12 (twelve) hours as needed (pain). Remove & Discard patch within 12 hours or as directed by MD 05/16/20   Vevelyn Francois, NP  montelukast (SINGULAIR) 10 MG tablet Take 1 tablet (10 mg total) by mouth at bedtime. 05/02/20   Vevelyn Francois, NP  NARCAN 4 MG/0.1ML LIQD nasal spray kit Place 4 mg into the nose once as needed (overdose).  Patient not taking: Reported on 09/25/2020 12/22/17   [provider]  ondansetron (ZOFRAN) 4 MG tablet Take 4 mg by mouth every 8 (eight) hours as needed for nausea or vomiting.     [provider]  oxyCODONE  (ROXICODONE) 15 MG immediate release tablet Take 1 tablet (15 mg total) by mouth every 6 (six) hours as needed for up to 15 days for pain (moderate to severe pain). 11/12/20 11/27/20  Vevelyn Francois, NP  promethazine (PHENERGAN) 12.5 MG tablet Take 1 tablet (12.5 mg total) by mouth every 4 (four) hours as needed for nausea or vomiting. 08/10/20   Azzie Glatter, FNP  tiZANidine (ZANAFLEX) 2 MG tablet TAKE 1 TABLET(2 MG) BY MOUTH EVERY 8 HOURS AS NEEDED FOR MUSCLE SPASMS 10/21/20   Vevelyn Francois, NP    Allergies    Penicillins  Review of Systems   Review of Systems  All other systems reviewed and are negative.   Physical Exam Updated Vital Signs BP (!) 123/97 (BP Location: Right Arm)   Pulse 68   Temp 97.9 F (36.6 C) (Oral)   Resp 16   Ht '5\' 3"'  (1.6 m)   Wt 68 kg   SpO2 100%   BMI 26.57 kg/m   Physical Exam Vitals and nursing note reviewed.  Constitutional:      General: She is not in acute distress.    Appearance: She is well-developed and well-nourished.  HENT:     Head: Normocephalic and atraumatic.  Eyes:     Conjunctiva/sclera: Conjunctivae normal.  Cardiovascular:     Rate and Rhythm: Normal rate and regular rhythm.     Heart sounds: No murmur heard.   Pulmonary:     Effort: Pulmonary effort is normal. No respiratory distress.     Breath sounds: Normal breath sounds.  Abdominal:     Palpations: Abdomen is soft.     Tenderness: There is no abdominal tenderness.  Musculoskeletal:        General: No edema. Normal range of motion.     Cervical back: Neck supple.  Skin:    General: Skin is warm and dry.  Neurological:     Mental Status: She is alert and oriented to person, place, and time.  Psychiatric:        Mood and Affect: Mood and affect and mood normal.        Behavior: Behavior normal.     ED Results / Procedures / Treatments   Labs (all labs ordered are listed, but only abnormal results are displayed) Labs Reviewed  COMPREHENSIVE METABOLIC  PANEL - Abnormal; Notable for the following components:      Result Value   Glucose, Bld 108 (*)    All other components  within normal limits  CBC WITH DIFFERENTIAL/PLATELET - Abnormal; Notable for the following components:   HCT 33.5 (*)    MCV 73.8 (*)    Platelets 461 (*)    All other components within normal limits  RETICULOCYTES - Abnormal; Notable for the following components:   Immature Retic Fract 21.4 (*)    All other components within normal limits  RESP PANEL BY RT-PCR (FLU A&B, COVID) [id number]  I-STAT BETA HCG BLOOD, ED ([facility], [facility], [facility] ONLY)    EKG None  Radiology No results found.  Procedures Procedures (including critical care time)  Medications Ordered in ED Medications  HYDROmorphone (DILAUDID) injection 2 mg (has no administration in time range)  HYDROmorphone (DILAUDID) injection 2 mg (has no administration in time range)  diphenhydrAMINE (BENADRYL) capsule 25-50 mg (has no administration in time range)  ondansetron (ZOFRAN-ODT) disintegrating tablet 8 mg (has no administration in time range)  0.45 % sodium chloride infusion (has no administration in time range)    ED Course  I have reviewed the triage vital signs and the nursing notes.  Pertinent labs & imaging results that were available during my care of the patient were reviewed by me and considered in my medical decision making (see chart for details).    MDM Rules/Calculators/A&P                          This patient complains of sickle cell pain, this involves an extensive number of treatment options, and is a complaint that carries with it a high risk of complications and morbidity.    Differential Dx Sickle cell crisis, infection, dehydration, chronic pain  Pertinent Labs I ordered, reviewed, and interpreted labs, which included CBC, CMP, reticulocytes.  Hemoglobin is higher than normal, question hemoconcentration secondary to dehydration.   Medications I ordered medication Dilaudid for pain  control.  Sources  Previous records obtained and reviewed most recent admission to the sickle cell clinic was the beginning of November.   Critical Interventions  None  Reassessments After the interventions stated above, I reevaluated the patient and found unimproved with pain management in the ED.  Consultants Dr. Erenest Blank, who is appreciated for admitting the patient.  Plan Admit for pain control.    Final Clinical Impression(s) / ED Diagnoses Final diagnoses:  Sickle cell pain crisis [facility])    Rx / Country Walk Orders ED Discharge Orders    None       Montine Circle, PA-C 11/30/20 0211    Molpus, Jenny Reichmann, MD 11/30/20 [phone]

## 2020-11-30 DIAGNOSIS — D5700 Hb-SS disease with crisis, unspecified: Secondary | ICD-10-CM | POA: Diagnosis not present

## 2020-11-30 LAB — RESP PANEL BY RT-PCR (FLU A&B, COVID) ARPGX2
Influenza A by PCR: NEGATIVE
Influenza B by PCR: NEGATIVE
SARS Coronavirus 2 by RT PCR: NEGATIVE

## 2020-11-30 MED ORDER — SODIUM CHLORIDE 0.9% FLUSH: 9.0000 mL | INTRAVENOUS | Status: DC | PRN

## 2020-11-30 MED ORDER — ACETAMINOPHEN 650 MG RE SUPP: 650.0000 mg | Freq: Four times a day (QID) | RECTAL | Status: DC | PRN

## 2020-11-30 MED ORDER — DIPHENHYDRAMINE HCL 25 MG PO CAPS: 25.0000 mg | ORAL_CAPSULE | ORAL | Status: DC | PRN

## 2020-11-30 MED ORDER — POLYETHYLENE GLYCOL 3350 17 G PO PACK: 17.0000 g | PACK | Freq: Every day | ORAL | Status: DC | PRN

## 2020-11-30 MED ORDER — SENNOSIDES-DOCUSATE SODIUM 8.6-50 MG PO TABS
1.0000 | ORAL_TABLET | Freq: Two times a day (BID) | ORAL | Status: DC
  Filled 2020-11-30 (×2): qty 1

## 2020-11-30 MED ORDER — HYDROMORPHONE HCL 1 MG/ML IJ SOLN
1.0000 mg | INTRAMUSCULAR | Status: DC | PRN
  Administered 2020-11-30: 1 mg via INTRAVENOUS
  Filled 2020-11-30: qty 1

## 2020-11-30 MED ORDER — ACETAMINOPHEN 325 MG PO TABS: 650.0000 mg | ORAL_TABLET | Freq: Four times a day (QID) | ORAL | Status: DC | PRN

## 2020-11-30 MED ORDER — SODIUM CHLORIDE 0.9 % IV SOLN
25.0000 mg | INTRAVENOUS | Status: DC | PRN
  Filled 2020-11-30: qty 0.5

## 2020-11-30 MED ORDER — ONDANSETRON HCL 4 MG/2ML IJ SOLN: 4.0000 mg | INTRAMUSCULAR | Status: DC | PRN

## 2020-11-30 MED ORDER — ENOXAPARIN SODIUM 40 MG/0.4ML ~~LOC~~ SOLN
40.0000 mg | SUBCUTANEOUS | Status: DC
  Filled 2020-11-30: qty 0.4

## 2020-11-30 MED ORDER — ENOXAPARIN SODIUM 40 MG/0.4ML ~~LOC~~ SOLN: 40.0000 mg | SUBCUTANEOUS | Status: DC

## 2020-11-30 MED ORDER — ONDANSETRON HCL 4 MG PO TABS: 4.0000 mg | ORAL_TABLET | ORAL | Status: DC | PRN

## 2020-11-30 MED ORDER — KETOROLAC TROMETHAMINE 30 MG/ML IJ SOLN: 30.0000 mg | Freq: Four times a day (QID) | INTRAMUSCULAR | Status: DC | PRN

## 2020-11-30 MED ORDER — NALOXONE HCL 0.4 MG/ML IJ SOLN
0.4000 mg | INTRAMUSCULAR | Status: DC | PRN
Start: 2020-11-30 — End: 2020-12-01

## 2020-11-30 MED ORDER — HYDROMORPHONE 1 MG/ML IV SOLN
INTRAVENOUS | Status: DC
Start: 2020-11-30 — End: 2020-12-01
  Administered 2020-11-30: 2.1 mg via INTRAVENOUS
  Administered 2020-11-30: 1.2 mg via INTRAVENOUS
  Administered 2020-11-30: 30 mg via INTRAVENOUS
  Administered 2020-12-01: 1.5 mg via INTRAVENOUS
  Filled 2020-11-30: qty 30

## 2020-11-30 NOTE — Progress Notes (Signed)
PCA hx cleared. Total dose 2.4. Demands 8. Delivered 8.

## 2020-11-30 NOTE — Progress Notes (Signed)
pca hx cleared. Total drug 1.6. demands 8. delivered 8

## 2020-11-30 NOTE — ED Notes (Signed)
ED TO INPATIENT HANDOFF REPORT  Name/Age/Gender Hannah Hawkins 34 y.o. female  Code Status    Code Status Orders  (From admission, onward)         Start     Ordered   11/30/20 0148  Full code  Continuous        11/30/20 0152        Code Status History    Date Active Date Inactive Code Status Order ID Comments User Context   11/30/2020 0152 11/30/2020 0152 Full Code [id number]  Bobette Mo, MD ED   10/27/2020 0947 10/27/2020 2216 Full Code [id number]  Massie Maroon, FNP Inpatient   10/20/2020 1158 10/20/2020 2143 Full Code [id number]  Massie Maroon, FNP Inpatient   09/23/2020 1144 09/23/2020 2146 Full Code [id number]  Massie Maroon, FNP Inpatient   08/07/2020 0536 08/10/2020 1740 Full Code [id number]  Therisa Doyne, MD ED   07/14/2020 0018 07/17/2020 2037 Full Code [id number]  Mansy, Vernetta Honey, MD ED   06/11/2020 2148 06/12/2020 2219 Full Code [id number]  Reva Bores, MD Inpatient   05/14/2020 0202 05/16/2020 1815 Full Code [id number]  Hugelmeyer, Alexis, DO Inpatient   05/12/2020 0945 05/12/2020 2303 Full Code [id number]  Massie Maroon, FNP Inpatient   03/20/2020 0940 03/20/2020 2153 Full Code [id number]  Quentin Angst, MD Inpatient   02/20/2020 1115 02/20/2020 2235 Full Code [id number]  Massie Maroon, FNP Inpatient   02/12/2020 0912 02/12/2020 2233 Full Code [id number]  Massie Maroon, FNP Inpatient   02/08/2020 1027 02/08/2020 2202 Full Code [id number]  Massie Maroon, FNP Inpatient   07/18/2019 0104 07/20/2019 2006 Full Code [id number]  Briscoe Deutscher, MD ED   07/05/2019 1008 07/05/2019 2039 Full Code [id number]  Massie Maroon, FNP Inpatient   06/07/2019 1214 06/07/2019 2012 Full Code [id number]  Massie Maroon, FNP Inpatient   05/29/2019 0418 05/31/2019 1943 Full Code [id number]  Therisa Doyne, MD Inpatient   05/15/2019 1825 05/18/2019 2134 Full Code [id number]  Pearson Grippe, MD ED   03/15/2019 1230 03/15/2019 2015 Full Code [id number]  Massie Maroon, FNP Inpatient    11/22/2018 1147 11/22/2018 1943 Full Code [id number]  Massie Maroon, FNP Inpatient   11/21/2018 1311 11/21/2018 2034 Full Code [id number]  Massie Maroon, FNP Inpatient   08/14/2018 1051 08/14/2018 2043 Full Code [id number]  Massie Maroon, FNP Inpatient   11/14/2017 0047 11/17/2017 1925 Full Code [id number]  Clydie Braun, MD ED   11/02/2017 1046 11/05/2017 1751 Full Code [id number]  Rometta Emery, MD Inpatient   10/13/2017 1021 10/13/2017 1547 Full Code [id number]  Massie Maroon, FNP Inpatient   Advance Care Planning Activity      Home/SNF/Other Home  Chief Complaint Sickle cell pain crisis (HCC) [D57.00]  Level of Care/Admitting Diagnosis ED Disposition    ED Disposition Condition Comment   Admit  Hospital Area: [facility] [[id number]]  Level of Care: Med-Surg [16]  Covid Evaluation: Asymptomatic Screening Protocol (No Symptoms)  Diagnosis: Sickle cell pain crisis [facility]) [[id number]]  Admitting Physician: Bobette Mo [[id number]]  Attending Physician: Bobette Mo [[id number]]       Medical History Past Medical History:  Diagnosis Date  . Sickle cell anemia (HCC)     Allergies Allergies  Allergen Reactions  . Penicillins Swelling and Rash    Has patient had a PCN reaction causing immediate rash, facial/tongue/throat swelling, SOB or lightheadedness with hypotension: Yes Has patient had a PCN reaction causing severe rash  involving mucus membranes or skin necrosis: Yes Has patient had a PCN reaction that required hospitalization No Has patient had a PCN reaction occurring within the last 10 years: no If all of the above answers are "NO", then may proceed with Cephalosporin use.     IV Location/Drains/Wounds Patient Lines/Drains/Airways Status    Active Line/Drains/Airways    Name Placement date Placement time Site Days   Peripheral IV Right Antecubital --  --  Antecubital  --          Labs/Imaging Results for orders placed or  performed during the hospital encounter of 11/29/20 (from the past 48 hour(s))  Comprehensive metabolic panel     Status: Abnormal   Collection Time: 11/29/20  8:24 PM  Result Value Ref Range   Sodium 138 135 - 145 mmol/L   Potassium 4.1 3.5 - 5.1 mmol/L   Chloride 103 98 - 111 mmol/L   CO2 26 22 - 32 mmol/L   Glucose, Bld 108 (H) 70 - 99 mg/dL    Comment: Glucose reference range applies only to samples taken after fasting for at least 8 hours.   BUN 9 6 - 20 mg/dL   Creatinine, Ser 4.09 0.44 - 1.00 mg/dL   Calcium 9.2 8.9 - 81.1 mg/dL   Total Protein 7.7 6.5 - 8.1 g/dL   Albumin 4.2 3.5 - 5.0 g/dL   AST 21 15 - 41 U/L   ALT 13 0 - 44 U/L   Alkaline Phosphatase 52 38 - 126 U/L   Total Bilirubin 0.8 0.3 - 1.2 mg/dL   GFR, Estimated >91 >47 mL/min    Comment: (NOTE) Calculated using the CKD-EPI Creatinine Equation (2021)    Anion gap 9 5 - 15    Comment: Performed at [facility], [address] [place], [place] [address]  CBC with Differential     Status: Abnormal   Collection Time: 11/29/20  8:24 PM  Result Value Ref Range   WBC 6.3 4.0 - 10.5 K/uL    Comment: WHITE COUNT CONFIRMED ON SMEAR   RBC 4.54 3.87 - 5.11 MIL/uL   Hemoglobin 12.0 12.0 - 15.0 g/dL   HCT 21.3 (L) 08.6 - 57.8 %   MCV 73.8 (L) 80.0 - 100.0 fL   MCH 26.4 26.0 - 34.0 pg   MCHC 35.8 30.0 - 36.0 g/dL   RDW 46.9 62.9 - 52.8 %   Platelets 461 (H) 150 - 400 K/uL   nRBC 0.0 0.0 - 0.2 %   Neutrophils Relative % 51 %   Neutro Abs 3.2 1.7 - 7.7 K/uL   Lymphocytes Relative 36 %   Lymphs Abs 2.3 0.7 - 4.0 K/uL   Monocytes Relative 7 %   Monocytes Absolute 0.5 0.1 - 1.0 K/uL   Eosinophils Relative 5 %   Eosinophils Absolute 0.3 0.0 - 0.5 K/uL   Basophils Relative 1 %   Basophils Absolute 0.1 0.0 - 0.1 K/uL   Immature Granulocytes 0 %   Abs Immature Granulocytes 0.02 0.00 - 0.07 K/uL   Target Cells PRESENT     Comment: Performed at [facility], [address]  [place], [place] [address]  Reticulocytes     Status: Abnormal   Collection Time: 11/29/20  8:24 PM  Result Value Ref Range   Retic Ct Pct 2.9 0.4 - 3.1 %   RBC. 4.49 3.87 - 5.11 MIL/uL   Retic Count, Absolute 128.9 19.0 - 186.0 K/uL   Immature Retic Fract 21.4 (  H) 2.3 - 15.9 %    Comment: Performed at [facility], [address], [place], [place] [address]  I-Stat beta hCG blood, ED     Status: None   Collection Time: 11/29/20  8:31 PM  Result Value Ref Range   I-stat hCG, quantitative <5.0 <5 mIU/mL   Comment 3            Comment:   GEST. AGE      CONC.  (mIU/mL)   <=1 WEEK        5 - 50     2 WEEKS       50 - 500     3 WEEKS       100 - 10,000     4 WEEKS     1,000 - 30,000        FEMALE AND NON-PREGNANT FEMALE:     LESS THAN 5 mIU/mL   Resp Panel by RT-PCR (Flu A&B, Covid) Nasopharyngeal Swab     Status: None   Collection Time: 11/29/20 11:30 PM   Specimen: Nasopharyngeal Swab; Nasopharyngeal(NP) swabs in vial transport medium  Result Value Ref Range   SARS Coronavirus 2 by RT PCR NEGATIVE NEGATIVE    Comment: (NOTE) SARS-CoV-2 target nucleic acids are NOT DETECTED.  The SARS-CoV-2 RNA is generally detectable in upper respiratory specimens during the acute phase of infection. The lowest concentration of SARS-CoV-2 viral copies this assay can detect is 138 copies/mL. A negative result does not preclude SARS-Cov-2 infection and should not be used as the sole basis for treatment or other patient management decisions. A negative result may occur with  improper specimen collection/handling, submission of specimen other than nasopharyngeal swab, presence of viral mutation(s) within the areas targeted by this assay, and inadequate number of viral copies(<138 copies/mL). A negative result must be combined with clinical observations, patient history, and epidemiological information. The expected result is Negative.  Fact Sheet for Patients:   BloggerCourse.com  Fact Sheet for Healthcare Providers:  SeriousBroker.it  This test is no t yet approved or cleared by the [place] FDA and  has been authorized for detection and/or diagnosis of SARS-CoV-2 by FDA under an Emergency Use Authorization (EUA). This EUA will remain  in effect (meaning this test can be used) for the duration of the COVID-19 declaration under Section 564(b)(1) of the Act, 21 U.S.C.section 360bbb-3(b)(1), unless the authorization is terminated  or revoked sooner.       Influenza A by PCR NEGATIVE NEGATIVE   Influenza B by PCR NEGATIVE NEGATIVE    Comment: (NOTE) The Xpert Xpress SARS-CoV-2/FLU/RSV plus assay is intended as an aid in the diagnosis of influenza from Nasopharyngeal swab specimens and should not be used as a sole basis for treatment. Nasal washings and aspirates are unacceptable for Xpert Xpress SARS-CoV-2/FLU/RSV testing.  Fact Sheet for Patients: BloggerCourse.com  Fact Sheet for Healthcare Providers: SeriousBroker.it  This test is not yet approved or cleared by the [place] FDA and has been authorized for detection and/or diagnosis of SARS-CoV-2 by FDA under an Emergency Use Authorization (EUA). This EUA will remain in effect (meaning this test can be used) for the duration of the COVID-19 declaration under Section 564(b)(1) of the Act, 21 U.S.C. section 360bbb-3(b)(1), unless the authorization is terminated or revoked.  Performed at [facility], [address], [place], [place] [address]    No results found.  Pending Labs [employer] (From admission, onward)  Start     Ordered   12/07/20 0500  Creatinine, serum  (enoxaparin (LOVENOX)    CrCl >/= 30 ml/min)  Weekly,   R     Comments: while on enoxaparin therapy    11/30/20 0152          Vitals/Pain Today's Vitals   11/30/20  0025 11/30/20 0030 11/30/20 0041 11/30/20 0100  BP:  (!) 148/86  133/80  Pulse:      Resp:  (!) 32  (!) 22  Temp:      TempSrc:      SpO2:      Weight:      Height:      PainSc: 10-Worst pain ever  10-Worst pain ever     Isolation Precautions No active isolations  Medications Medications  diphenhydrAMINE (BENADRYL) capsule 25-50 mg (25 mg Oral Given 11/29/20 2355)  0.45 % sodium chloride infusion ( Intravenous New Bag/Given 11/29/20 2359)  HYDROmorphone (DILAUDID) injection 1 mg (has no administration in time range)  senna-docusate (Senokot-S) tablet 1 tablet (has no administration in time range)  polyethylene glycol (MIRALAX / GLYCOLAX) packet 17 g (has no administration in time range)  enoxaparin (LOVENOX) injection 40 mg (has no administration in time range)  ketorolac (TORADOL) 30 MG/ML injection 30 mg (has no administration in time range)  ondansetron (ZOFRAN) tablet 4 mg (has no administration in time range)    Or  ondansetron (ZOFRAN) injection 4 mg (has no administration in time range)  acetaminophen (TYLENOL) tablet 650 mg (has no administration in time range)    Or  acetaminophen (TYLENOL) suppository 650 mg (has no administration in time range)  HYDROmorphone (DILAUDID) injection 2 mg (2 mg Intravenous Given 11/29/20 2355)  HYDROmorphone (DILAUDID) injection 2 mg (2 mg Intravenous Given 11/30/20 0041)    Mobility walks

## 2020-11-30 NOTE — Progress Notes (Signed)
PCA history cleared - total 2.1 demands 9 delivered 7

## 2020-11-30 NOTE — H&P (Signed)
History and Physical    Hannah Hawkins OVF:[id number] DOB: Nov 26, 1986 DOA: 11/29/2020  PCP: Vevelyn Francois, NP  Patient coming from: Home.  I have personally briefly reviewed patient's old medical records in [place]  Chief Complaint: [place] pain crisis.  HPI: Hannah Hawkins is a 34 y.o. female with medical history significant of sickle cell anemia, GERD, bilateral eye surgery, previous cocaine use who is coming to the emergency department with complaints of myalgias and painful arthralgias typical of her sickle cell pain crisis.  She also states she has had some diarrhea, but denies abdominal pain, nausea or emesis, melena or hematochezia.  She denies fever, rhinorrhea, sore throat, productive cough, chest pain, dyspnea, palpitations, dizziness, diaphoresis or PND.  No dysuria, frequency or hematuria.  He denies polyuria, polydipsia, polyphagia or blurred vision.  ED Course: Initial vital signs were temperature 97.9 F, pulse 68, respirations 16, BP 123/97 mmHg and O2 sat 100% on room air.  She was started on half-normal saline infusion at 150 mL/h and received 2 mg of hydromorphone IVP x2.  Labwork: CBC shows a white count 6.3 with a normal differential, hemoglobin 12.0 g/dL, hematocrit 33.5% with an MCV of 73.8 fL.  Her platelets were 461.  Reticulocyte count was 2.9%.  Immature reticulocyte fraction was 21.4%.  CMP showed a nonfasting glucose of 108 mg/dL, all other values were unremarkable.  SARS coronavirus two and influenza PCR was negative.  Review of Systems: As per HPI otherwise all other systems reviewed and are negative.  Past Medical History:  Diagnosis Date  . Sickle cell anemia (HCC)     Past Surgical History:  Procedure Laterality Date  . EYE SURGERY  03/09/2017   left eye  . EYE SURGERY Right 09/2019   right [place]     Social History  reports that she has quit smoking. Her smoking use included cigarettes. She has never used smokeless tobacco. She reports  previous alcohol use. She reports that she does not use drugs.  Allergies  Allergen Reactions  . Penicillins Swelling and Rash    Has patient had a PCN reaction causing immediate rash, facial/tongue/throat swelling, SOB or lightheadedness with hypotension: Yes Has patient had a PCN reaction causing severe rash involving mucus membranes or skin necrosis: Yes Has patient had a PCN reaction that required hospitalization No Has patient had a PCN reaction occurring within the last 10 years: no If all of the above answers are "NO", then may proceed with Cephalosporin use.     Family History  Problem Relation Age of Onset  . Diabetes Father    Prior to Admission medications   Medication Sig Start Date End Date Taking? Authorizing Provider  citalopram (CELEXA) 20 MG tablet Take 1 tablet (20 mg total) by mouth daily. 09/03/20 09/03/21 Yes Vevelyn Francois, NP  ibuprofen (ADVIL) 800 MG tablet Take 1 tablet (800 mg total) by mouth 3 (three) times daily. 09/25/20 09/25/21 Yes King, Diona Foley, NP  lidocaine (LIDODERM) 5 % Place 1 patch onto the skin daily. Remove & Discard patch within 12 hours or as directed by MD Patient taking differently: Place 1 patch onto the skin every 12 (twelve) hours as needed (pain). Remove & Discard patch within 12 hours or as directed by MD 05/16/20  Yes Vevelyn Francois, NP  morphine (MS CONTIN) 30 MG 12 hr tablet Take 30 mg by mouth 2 (two) times daily as needed for pain. Last filled 11/19   Yes [provider]  [facility] 4  MG/0.1ML LIQD nasal spray kit Place 4 mg into the nose once as needed (overdose). 12/22/17  Yes [provider]  ondansetron (ZOFRAN) 4 MG tablet Take 4 mg by mouth every 8 (eight) hours as needed for nausea or vomiting.    Yes [provider]  oxyCODONE (ROXICODONE) 15 MG immediate release tablet Take 1 tablet (15 mg total) by mouth every 6 (six) hours as needed for up to 15 days for pain (moderate to severe pain). 11/12/20 11/27/20 Yes  King, Diona Foley, NP  tiZANidine (ZANAFLEX) 2 MG tablet TAKE 1 TABLET(2 MG) BY MOUTH EVERY 8 HOURS AS NEEDED FOR MUSCLE SPASMS 10/21/20  Yes Vevelyn Francois, NP  ergocalciferol (VITAMIN D2) 1.25 MG (50000 UT) capsule Take 1 capsule (50,000 Units total) by mouth once a week. Patient not taking: No sig reported 05/10/19   Lanae Boast, FNP  Iron-Vitamins (GERITOL PO) Take 1 tablet by mouth daily. Patient not taking: Reported on 11/30/2020    [provider]  levocetirizine (XYZAL) 5 MG tablet Take 5 mg by mouth every evening. Patient not taking: Reported on 11/30/2020    [provider]  montelukast (SINGULAIR) 10 MG tablet Take 1 tablet (10 mg total) by mouth at bedtime. Patient not taking: Reported on 11/30/2020 05/02/20   Vevelyn Francois, NP  promethazine (PHENERGAN) 12.5 MG tablet Take 1 tablet (12.5 mg total) by mouth every 4 (four) hours as needed for nausea or vomiting. Patient not taking: Reported on 11/30/2020 08/10/20   Azzie Glatter, FNP   Physical Exam: Vitals:   11/29/20 1929 11/30/20 0002 11/30/20 0030  BP: (!) 123/97 137/87 (!) 148/86  Pulse: 68 (!) 57   Resp: 16 16 (!) 32  Temp: 97.9 F (36.6 C) 98 F (36.7 C)   TempSrc: Oral Oral   SpO2: 100% 100%   Weight: 68 kg    Height: _0  (1.6 m)     Constitutional: NAD, calm, comfortable Eyes: PERRL, lids and conjunctivae normal ENMT: Mucous membranes are moist. Posterior pharynx clear of any exudate or lesions. Neck: normal, supple, no masses, no thyromegaly Respiratory: clear to auscultation bilaterally, no wheezing, no crackles. Normal respiratory effort. No accessory muscle use.  Cardiovascular: Regular rate and rhythm, no murmurs / rubs / gallops. No extremity edema. 2+ pedal pulses. No carotid bruits.  Abdomen: Nondistended. Soft, no tenderness, no masses palpated. No hepatosplenomegaly. Bowel sounds positive.  Musculoskeletal: no clubbing / cyanosis. Good ROM, no contractures. Normal muscle tone.   Skin: no major rashes, lesions, ulcers on very limited dermatological examination. Neurologic: CN 2-12 grossly intact. Sensation intact, DTR normal. Strength 5/5 in all 4.  Psychiatric: Normal judgment and insight. Alert and oriented x 3. Normal mood.   Labs on Admission: I have personally reviewed following labs and imaging studies  CBC: Recent Labs  Lab 11/29/20 2024  WBC 6.3  NEUTROABS 3.2  HGB 12.0  HCT 33.5*  MCV 73.8*  PLT 461*    Basic Metabolic Panel: Recent Labs  Lab 11/29/20 2024  NA 138  K 4.1  CL 103  CO2 26  GLUCOSE 108*  BUN 9  CREATININE 0.81  CALCIUM 9.2    GFR: Estimated Creatinine Clearance: 88.8 mL/min (by C-G formula based on SCr of 0.81 mg/dL).  Liver Function Tests: Recent Labs  Lab 11/29/20 2024  AST 21  ALT 13  ALKPHOS 52  BILITOT 0.8  PROT 7.7  ALBUMIN 4.2   Radiological Exams on Admission: No results found.  EKG: Independently reviewed.  Assessment/Plan Principal Problem:   Sickle cell pain crisis ([place]) Observation/telemetry. Continue IV hydration. Supplemental oxygen as needed. Antihistamine as needed. Antiemetic as needed. Hydromorphone PCA. Monitor O2 sat and ET CO2. Ketorolac 30 mg IVP every 6 hours PRN. Sickle cell team will be following.  Active Problems:   Chronic pain syndrome On PCA.    GERD (gastroesophageal reflux disease) Protonix 40 mg p.o. daily.    DVT prophylaxis: Lovenox SQ. Code Status:   Full code. Family Communication:   Disposition Plan:   Patient is from:  Home.  Anticipated DC to:  Home.  Anticipated DC date:  12/01/2020.  Anticipated DC barriers: Clinical status.  Consults called: Admission status:  Observation/MedSurg.  Severity of Illness:  High due to sickle cell pain crisis that did not respond to her usual home analgesic regimen.  Continues admitted for IV hydration and pain control with PCA.  Reubin Milan MD [facility]  How to contact the [facility] Attending or  Consulting provider [place] or covering provider during after hours [place], for this patient?   1. Check the care team in [facility] and look for a) attending/consulting [facility] provider listed and b) the [facility] team listed 2. Log into [email or web address] and use [place]'s universal password to access. If you do not have the password, please contact the hospital operator. 3. Locate the [facility] provider you are looking for under [facility] and page to a number that you can be directly reached. 4. If you still have difficulty reaching the provider, please page the [facility] (Director on Call) for the Hospitalists listed on amion for assistance.  11/30/2020, 2:14 AM   This document was prepared using Dragon voice recognition software and may contain some unintended transcription errors.

## 2020-12-01 ENCOUNTER — Telehealth: Payer: Self-pay | Admitting: Nurse Practitioner

## 2020-12-01 DIAGNOSIS — D5700 Hb-SS disease with crisis, unspecified: Secondary | ICD-10-CM | POA: Diagnosis not present

## 2020-12-01 LAB — CBC WITH DIFFERENTIAL/PLATELET
Abs Immature Granulocytes: 0.04 10*3/uL (ref 0.00–0.07)
Basophils Absolute: 0.1 10*3/uL (ref 0.0–0.1)
Basophils Relative: 1 %
Eosinophils Absolute: 1.2 10*3/uL — ABNORMAL HIGH (ref 0.0–0.5)
Eosinophils Relative: 12 %
HCT: 32.4 % — ABNORMAL LOW (ref 36.0–46.0)
Hemoglobin: 11.7 g/dL — ABNORMAL LOW (ref 12.0–15.0)
Immature Granulocytes: 0 %
Lymphocytes Relative: 49 %
Lymphs Abs: 5 10*3/uL — ABNORMAL HIGH (ref 0.7–4.0)
MCH: 27 pg (ref 26.0–34.0)
MCHC: 36.1 g/dL — ABNORMAL HIGH (ref 30.0–36.0)
MCV: 74.7 fL — ABNORMAL LOW (ref 80.0–100.0)
Monocytes Absolute: 0.8 10*3/uL (ref 0.1–1.0)
Monocytes Relative: 8 %
Neutro Abs: 3.1 10*3/uL (ref 1.7–7.7)
Neutrophils Relative %: 30 %
Platelets: 439 10*3/uL — ABNORMAL HIGH (ref 150–400)
RBC: 4.34 MIL/uL (ref 3.87–5.11)
RDW: 13.4 % (ref 11.5–15.5)
WBC: 10.2 10*3/uL (ref 4.0–10.5)
nRBC: 3.2 % — ABNORMAL HIGH (ref 0.0–0.2)

## 2020-12-01 LAB — BASIC METABOLIC PANEL
Anion gap: 9 (ref 5–15)
BUN: 9 mg/dL (ref 6–20)
CO2: 24 mmol/L (ref 22–32)
Calcium: 8.9 mg/dL (ref 8.9–10.3)
Chloride: 105 mmol/L (ref 98–111)
Creatinine, Ser: 0.81 mg/dL (ref 0.44–1.00)
GFR, Estimated: >60 mL/min (ref >60)
Glucose, Bld: 88 mg/dL (ref 70–99)
Potassium: 3.9 mmol/L (ref 3.5–5.1)
Sodium: 138 mmol/L (ref 135–145)

## 2020-12-01 MED ORDER — ACETAMINOPHEN 500 MG PO TABS
1000.0000 mg | ORAL_TABLET | Freq: Once | ORAL | Status: AC
  Administered 2020-12-01: 1000 mg via ORAL
  Filled 2020-12-01: qty 2

## 2020-12-01 MED ORDER — MORPHINE SULFATE ER 15 MG PO TBCR
30.0000 mg | EXTENDED_RELEASE_TABLET | Freq: Once | ORAL | Status: AC
  Administered 2020-12-01: 30 mg via ORAL
  Filled 2020-12-01: qty 2

## 2020-12-01 MED ORDER — LIP MEDEX EX OINT
TOPICAL_OINTMENT | CUTANEOUS | Status: DC | PRN
  Filled 2020-12-01: qty 7

## 2020-12-01 MED ORDER — OXYCODONE HCL 5 MG PO TABS
15.0000 mg | ORAL_TABLET | Freq: Once | ORAL | Status: AC
  Administered 2020-12-01: 15 mg via ORAL
  Filled 2020-12-01: qty 3

## 2020-12-01 NOTE — Plan of Care (Signed)
  Problem: Education: Goal: Knowledge of General Education information will improve Description: Including pain rating scale, medication(s)/side effects and non-pharmacologic comfort measures Outcome: Adequate for Discharge   Problem: Health Behavior/Discharge Planning: Goal: Ability to manage health-related needs will improve Outcome: Adequate for Discharge   Problem: Clinical Measurements: Goal: Ability to maintain clinical measurements within normal limits will improve Outcome: Adequate for Discharge Goal: Will remain free from infection Outcome: Adequate for Discharge Goal: Diagnostic test results will improve Outcome: Adequate for Discharge   Problem: Activity: Goal: Risk for activity intolerance will decrease Outcome: Adequate for Discharge   Problem: Coping: Goal: Level of anxiety will decrease Outcome: Adequate for Discharge   Problem: Elimination: Goal: Will not experience complications related to bowel motility Outcome: Adequate for Discharge Goal: Will not experience complications related to urinary retention Outcome: Adequate for Discharge   Problem: Pain Managment: Goal: General experience of comfort will improve Outcome: Adequate for Discharge   Problem: Safety: Goal: Ability to remain free from injury will improve Outcome: Adequate for Discharge   Problem: Skin Integrity: Goal: Risk for impaired skin integrity will decrease Outcome: Adequate for Discharge   Problem: Activity: Goal: Ability to return to normal activity level will improve to the fullest extent possible by discharge Outcome: Adequate for Discharge   Problem: Education: Goal: Knowledge of medication regimen will be met for pain relief regimen by discharge Outcome: Adequate for Discharge Goal: Understanding of ways to prevent infection will improve by discharge Outcome: Adequate for Discharge   Problem: Coping: Goal: Ability to verbalize feelings will improve by discharge Outcome:  Adequate for Discharge Goal: Family members realistic understanding of the patients condition will improve by discharge Outcome: Adequate for Discharge   Problem: Fluid Volume: Goal: Maintenance of adequate hydration will improve by discharge Outcome: Adequate for Discharge   Problem: Medication: Goal: Compliance with prescribed medication regimen will improve by discharge Outcome: Adequate for Discharge   Problem: Physical Regulation: Goal: Hemodynamic stability will return to baseline for the patient by discharge Outcome: Adequate for Discharge Goal: Diagnostic test results will improve Outcome: Adequate for Discharge Goal: Will remain free from infection Outcome: Adequate for Discharge   Problem: Respiratory: Goal: Ability to maintain adequate oxygenation and ventilation will improve by discharge Outcome: Adequate for Discharge   Problem: Role Relationship: Goal: Ability to identify and utilize available support systems will improve by discharge Outcome: Adequate for Discharge   Problem: Pain Management: Goal: Satisfaction with pain management regimen will be met by discharge Outcome: Adequate for Discharge

## 2020-12-01 NOTE — Discharge Summary (Signed)
Physician Discharge Summary  Hannah Hawkins UVO:[id number] DOB: [birth year] DOA: 11/29/2020  PCP: Vevelyn Francois, NP  Admit date: 11/29/2020  Discharge date: 12/02/2020  Discharge Diagnoses:  Principal Problem:   Sickle cell pain crisis ([place]) Active Problems:   Chronic pain syndrome   GERD (gastroesophageal reflux disease)   Discharge Condition: Stable  Disposition:  Pt is discharged home in good condition and is to follow up with Vevelyn Francois, NP this week to have labs evaluated. Hannah Hawkins is instructed to increase activity slowly and balance with rest for the next few days, and use prescribed medication to complete treatment of pain  Diet: Regular Wt Readings from Last 3 Encounters:  11/29/20 68 kg  09/25/20 68.4 kg  09/22/20 68.5 kg    History of present illness:  Hannah Hawkins is a 34 year old female with a medical history significant for sickle cell disease, GERD, bilateral eye surgery, previous cocaine use who is coming to the emergency department with complaints of myalgias and painful arthralgias typical of previous sickle cell pain crisis.  She also states that she has had some diarrhea, but denies abdominal pain, nausea or emesis, melena or hematochezia.  She denies fever, rhinorrhea, sore throat, productive cough, chest pain, dyspnea, palpitations, dizziness, diaphoresis or PND.  No dysuria, frequency, or hematuria.  She denies polyuria, polydipsia, polyphagia, or blurred vision.  ER course: Initial vital signs were temperature of 97.9 F, pulse 68, respirations 16, BP 123/97 mmHg and O2 saturations 100% on RA.  She was started on half-normal saline infusion at 150 mL/h and received 2 mg of hydromorphone IVP x2.  Pain persisted despite treatment. CBC shows a white count of 6.3 with a normal differential, hemoglobin 12.0 g/dL, hematocrit 33.5% with MCV of 78.8.  Platelets were 461.  Reticulocyte count 2.9%.  Immature reticulocyte fraction was 21.4%.  CMP  showed nonfasting glucose of 108 mg/dL, all other values were unremarkable.  SARS coronavirus and influenza PCR were both negative. Hospital Course:  Sickle cell disease with pain crisis: Patient was admitted for sickle cell pain crisis and managed appropriately with IVF, IV Dilaudid via PCA and IV Toradol, as well as other adjunct therapies per sickle cell pain management protocols.  IV Dilaudid PCA was weaned appropriately.  Patient transition to home medications.  Pain intensity decreased to 5/10.  Patient advised to resume all home medications.  Opiate medication regimen managed by PCP.  Reviewed PDMP, no inconsistencies noted.  Previous drug screen within normal range.  Advised to follow-up with PCP in 2 weeks to repeat CBC with differential and CMP. Patient is alert, oriented, and ambulating without assistance.   Patient was therefore discharged home today in a hemodynamically stable condition.   Hannah Hawkins will follow-up with PCP within 1 week of this discharge. Hannah Hawkins was counseled extensively about nonpharmacologic means of pain management, patient verbalized understanding and was appreciative of  the care received during this admission.   We discussed the need for good hydration, monitoring of hydration status, avoidance of heat, cold, stress, and infection triggers.  We discussed the need to be adherent with home medications.  Continue folic acid 1 mg daily.  Hydrate with around 64 ounces of fluids per day patient was reminded of the need to seek medical attention immediately if any symptom of bleeding, anemia, or infection occurs.  Discharge Exam: Vitals:   12/01/20 1217 12/01/20 1250  BP: 133/78   Pulse: 78   Resp: 17 16  Temp: 98.8 F (37.1 C)   SpO2: 100%  98%   Vitals:   12/01/20 0801 12/01/20 0810 12/01/20 1217 12/01/20 1250  BP:  (!) 109/94 133/78   Pulse:  64 78   Resp: '16 17 17 16  ' Temp:  97.7 F (36.5 C) 98.8 F (37.1 C)   TempSrc:  Oral    SpO2: 99% 100% 100% 98%   Weight:      Height:        General appearance : Awake, alert, not in any distress. Speech Clear. Not toxic looking HEENT: Atraumatic and Normocephalic, pupils equally reactive to light and accomodation Neck: Supple, no JVD. No cervical lymphadenopathy.  Chest: Good air entry bilaterally, no added sounds  CVS: S1 S2 regular, no murmurs.  Abdomen: Bowel sounds present, Non tender and not distended with no gaurding, rigidity or rebound. Extremities: B/L Lower Ext shows no edema, both legs are warm to touch Neurology: Awake alert, and oriented X 3, CN II-XII intact, Non focal Skin: No Rash  Discharge Instructions  Discharge Instructions    Discharge patient   Complete by: As directed    Discharge disposition: 01-Home or Self Care   Discharge patient date: 12/01/2020     Allergies as of 12/01/2020      Reactions   Penicillins Swelling, Rash   Has patient had a PCN reaction causing immediate rash, facial/tongue/throat swelling, SOB or lightheadedness with hypotension: Yes Has patient had a PCN reaction causing severe rash involving mucus membranes or skin necrosis: Yes Has patient had a PCN reaction that required hospitalization No Has patient had a PCN reaction occurring within the last 10 years: no If all of the above answers are "NO", then may proceed with Cephalosporin use.      Medication List    STOP taking these medications   GERITOL PO     TAKE these medications   citalopram 20 MG tablet Commonly known as: CeleXA Take 1 tablet (20 mg total) by mouth daily.   ergocalciferol 1.25 MG (50000 UT) capsule Commonly known as: VITAMIN D2 Take 1 capsule (50,000 Units total) by mouth once a week.   ibuprofen 800 MG tablet Commonly known as: ADVIL Take 1 tablet (800 mg total) by mouth 3 (three) times daily.   levocetirizine 5 MG tablet Commonly known as: XYZAL Take 5 mg by mouth every evening.   lidocaine 5 % Commonly known as: Lidoderm Place 1 patch onto the skin  daily. Remove & Discard patch within 12 hours or as directed by MD What changed:   when to take this  reasons to take this   montelukast 10 MG tablet Commonly known as: SINGULAIR Take 1 tablet (10 mg total) by mouth at bedtime.   morphine 30 MG 12 hr tablet Commonly known as: MS CONTIN Take 30 mg by mouth 2 (two) times daily as needed for pain. Last filled 11/19   Narcan 4 MG/0.1ML Liqd nasal spray kit Generic drug: naloxone Place 4 mg into the nose once as needed (overdose).   ondansetron 4 MG tablet Commonly known as: ZOFRAN Take 4 mg by mouth every 8 (eight) hours as needed for nausea or vomiting.   oxyCODONE 15 MG immediate release tablet Commonly known as: ROXICODONE Take 1 tablet (15 mg total) by mouth every 6 (six) hours as needed for up to 15 days for pain (moderate to severe pain).   promethazine 12.5 MG tablet Commonly known as: PHENERGAN Take 1 tablet (12.5 mg total) by mouth every 4 (four) hours as needed for nausea or vomiting.  tiZANidine 2 MG tablet Commonly known as: ZANAFLEX TAKE 1 TABLET(2 MG) BY MOUTH EVERY 8 HOURS AS NEEDED FOR MUSCLE SPASMS       The results of significant diagnostics from this hospitalization (including imaging, microbiology, ancillary and laboratory) are listed below for reference.    Significant Diagnostic Studies: No results found.  Microbiology: Recent Results (from the past 240 hour(s))  Resp Panel by RT-PCR (Flu A&B, Covid) Nasopharyngeal Swab     Status: None   Collection Time: 11/29/20 11:30 PM   Specimen: Nasopharyngeal Swab; Nasopharyngeal(NP) swabs in vial transport medium  Result Value Ref Range Status   SARS Coronavirus 2 by RT PCR NEGATIVE NEGATIVE Final    Comment: (NOTE) SARS-CoV-2 target nucleic acids are NOT DETECTED.  The SARS-CoV-2 RNA is generally detectable in upper respiratory specimens during the acute phase of infection. The lowest concentration of SARS-CoV-2 viral copies this assay can detect  is 138 copies/mL. A negative result does not preclude SARS-Cov-2 infection and should not be used as the sole basis for treatment or other patient management decisions. A negative result may occur with  improper specimen collection/handling, submission of specimen other than nasopharyngeal swab, presence of viral mutation(s) within the areas targeted by this assay, and inadequate number of viral copies(<138 copies/mL). A negative result must be combined with clinical observations, patient history, and epidemiological information. The expected result is Negative.  Fact Sheet for Patients:  EntrepreneurPulse.com.au  Fact Sheet for Healthcare Providers:  IncredibleEmployment.be  This test is no t yet approved or cleared by the [place] FDA and  has been authorized for detection and/or diagnosis of SARS-CoV-2 by FDA under an Emergency Use Authorization (EUA). This EUA will remain  in effect (meaning this test can be used) for the duration of the COVID-19 declaration under Section 564(b)(1) of the Act, 21 U.S.C.section 360bbb-3(b)(1), unless the authorization is terminated  or revoked sooner.       Influenza A by PCR NEGATIVE NEGATIVE Final   Influenza B by PCR NEGATIVE NEGATIVE Final    Comment: (NOTE) The Xpert Xpress SARS-CoV-2/FLU/RSV plus assay is intended as an aid in the diagnosis of influenza from Nasopharyngeal swab specimens and should not be used as a sole basis for treatment. Nasal washings and aspirates are unacceptable for Xpert Xpress SARS-CoV-2/FLU/RSV testing.  Fact Sheet for Patients: EntrepreneurPulse.com.au  Fact Sheet for Healthcare Providers: IncredibleEmployment.be  This test is not yet approved or cleared by the [place] FDA and has been authorized for detection and/or diagnosis of SARS-CoV-2 by FDA under an Emergency Use Authorization (EUA). This EUA will remain in effect  (meaning this test can be used) for the duration of the COVID-19 declaration under Section 564(b)(1) of the Act, 21 U.S.C. section 360bbb-3(b)(1), unless the authorization is terminated or revoked.  Performed at [facility], [place] [address], [place], [place] [address]      Labs: Basic Metabolic Panel: Recent Labs  Lab 11/29/20 2024 12/01/20 0404  NA 138 138  K 4.1 3.9  CL 103 105  CO2 26 24  GLUCOSE 108* 88  BUN 9 9  CREATININE 0.81 0.81  CALCIUM 9.2 8.9   Liver Function Tests: Recent Labs  Lab 11/29/20 2024  AST 21  ALT 13  ALKPHOS 52  BILITOT 0.8  PROT 7.7  ALBUMIN 4.2   No results for input(s): LIPASE, AMYLASE in the last 168 hours. No results for input(s): AMMONIA in the last 168 hours. CBC: Recent Labs  Lab 11/29/20 2024 12/01/20 0404  WBC 6.3 10.2  NEUTROABS 3.2 3.1  HGB 12.0 11.7*  HCT 33.5* 32.4*  MCV 73.8* 74.7*  PLT 461* 439*   Cardiac Enzymes: No results for input(s): CKTOTAL, CKMB, CKMBINDEX, TROPONINI in the last 168 hours. BNP: Invalid input(s): POCBNP CBG: No results for input(s): GLUCAP in the last 168 hours.  Time coordinating discharge: 35 minutes  Signed:  Donia Pounds  APRN, MSN, FNP-C [facility] [place] [address] [place], [place] [address] [phone]  [facility] 12/02/2020, 9:52 AM

## 2020-12-02 ENCOUNTER — Other Ambulatory Visit: Payer: Self-pay

## 2020-12-02 DIAGNOSIS — G894 Chronic pain syndrome: Secondary | ICD-10-CM

## 2020-12-02 DIAGNOSIS — D5720 Sickle-cell/Hb-C disease without crisis: Secondary | ICD-10-CM

## 2020-12-02 NOTE — Telephone Encounter (Signed)
Sent to provider 

## 2020-12-03 ENCOUNTER — Other Ambulatory Visit: Payer: Self-pay | Admitting: Nurse Practitioner

## 2020-12-03 ENCOUNTER — Other Ambulatory Visit: Payer: Self-pay | Admitting: Internal Medicine

## 2020-12-03 DIAGNOSIS — G894 Chronic pain syndrome: Secondary | ICD-10-CM

## 2020-12-03 DIAGNOSIS — D5720 Sickle-cell/Hb-C disease without crisis: Secondary | ICD-10-CM

## 2020-12-03 MED ORDER — MORPHINE SULFATE ER 30 MG PO TBCR: 30.0000 mg | EXTENDED_RELEASE_TABLET | Freq: Two times a day (BID) | ORAL | 0 refills | Status: DC | PRN

## 2020-12-03 MED ORDER — OXYCODONE HCL 15 MG PO TABS: 15.0000 mg | ORAL_TABLET | Freq: Four times a day (QID) | ORAL | 0 refills | Status: DC | PRN

## 2020-12-03 NOTE — Telephone Encounter (Signed)
Is this okay to fill pt hasn't had this since  02/25/20 given as prn ?

## 2020-12-03 NOTE — Telephone Encounter (Signed)
Is this okay to refill? 

## 2020-12-16 ENCOUNTER — Telehealth: Payer: Self-pay | Admitting: Nurse Practitioner

## 2020-12-16 ENCOUNTER — Telehealth (HOSPITAL_COMMUNITY): Payer: Self-pay | Admitting: General Practice

## 2020-12-16 ENCOUNTER — Other Ambulatory Visit: Payer: Self-pay | Admitting: Family Medicine

## 2020-12-16 DIAGNOSIS — G894 Chronic pain syndrome: Secondary | ICD-10-CM

## 2020-12-16 DIAGNOSIS — D5720 Sickle-cell/Hb-C disease without crisis: Secondary | ICD-10-CM

## 2020-12-16 MED ORDER — OXYCODONE HCL 15 MG PO TABS: 15.0000 mg | ORAL_TABLET | Freq: Four times a day (QID) | ORAL | 0 refills | Status: DC | PRN

## 2020-12-16 NOTE — Telephone Encounter (Signed)
Done

## 2020-12-16 NOTE — Telephone Encounter (Signed)
Patient called, requesting to come to the day hospital due to pain in the right leg rated at 10/10. Per provider,the [facility] does not have the capacity to admit the patient. Patient  told to continue to take pain medications as prescribed, go to the ER if the pain is unbearable today or call the [facility] back tomorrow. Patient verbalized understanding.

## 2020-12-29 ENCOUNTER — Telehealth: Payer: Self-pay | Admitting: Nurse Practitioner

## 2020-12-29 ENCOUNTER — Other Ambulatory Visit: Payer: Self-pay | Admitting: Nurse Practitioner

## 2020-12-29 DIAGNOSIS — G894 Chronic pain syndrome: Secondary | ICD-10-CM

## 2020-12-29 DIAGNOSIS — D5720 Sickle-cell/Hb-C disease without crisis: Secondary | ICD-10-CM

## 2020-12-29 MED ORDER — OXYCODONE HCL 15 MG PO TABS: 15.0000 mg | ORAL_TABLET | Freq: Four times a day (QID) | ORAL | 0 refills | Status: DC | PRN

## 2020-12-29 NOTE — Telephone Encounter (Signed)
Called and spoke with [facility] pt medication is approved and called notified patient

## 2020-12-29 NOTE — Progress Notes (Signed)
   [facility] [name] [place], [place]  [address] Phone:  [phone]   Fax:  [phone] PDMP not reviewed this encounter.Subjective:    Hannah Hawkins calls for refill of her oxycodone and morphine   Objective:   Indication for chronic opioid: Chronic pain related to sickle cell disease Medication and dose: Oxycodone 15 mg  # pills per month: 120 mg Last UDS date: 09/2020 Opioid Treatment Agreement signed (Y/N): Y Opioid Treatment Agreement last reviewed with patient:   NCCSRS reviewed this encounter (include red flags):   reviewed    Assessment:   Chronic Pain Syndrome related to Sickle Cell Disease    Plan:  Oxycodone 15 mg # 60 sent  Return for follow-up appointment as scheduled.

## 2020-12-29 NOTE — Telephone Encounter (Signed)
Please set Hannah Hawkins an appointment I have not seen her since October thanks

## 2021-01-02 ENCOUNTER — Other Ambulatory Visit: Payer: Self-pay

## 2021-01-02 ENCOUNTER — Telehealth (INDEPENDENT_AMBULATORY_CARE_PROVIDER_SITE_OTHER): Payer: Medicaid Other | Admitting: Nurse Practitioner

## 2021-01-02 ENCOUNTER — Encounter: Payer: Self-pay | Admitting: Nurse Practitioner

## 2021-01-02 DIAGNOSIS — D571 Sickle-cell disease without crisis: Secondary | ICD-10-CM

## 2021-01-02 DIAGNOSIS — G894 Chronic pain syndrome: Secondary | ICD-10-CM

## 2021-01-02 NOTE — Progress Notes (Signed)
   [facility] Patient [facility] [address] Hannah Hawkins [place], [place]  [address] Phone:  [phone]   Fax:  [phone] Virtual Visit via Telephone Note  I connected with Hannah Hawkins on 01/02/21 at  2:00 PM EST by telephone and verified that I am speaking with the correct person using two identifiers.   I discussed the limitations, risks, security and privacy concerns of performing an evaluation and management service by telephone and the availability of in person appointments. I also discussed with the patient that there may be a patient responsible charge related to this service. The patient expressed understanding and agreed to proceed.  Patient home Provider Office  History of Present Illness:  She is follow up. She admits that she continues to have swelling worse in her hands. She is having 8/10 right leg pain ankle and knee pain. Denies fever, headache, cough, wheezing, shortness of breath, chest pains, abdominal pain, back pain, hip pain. Denies any open wounds, skin irritation.    Observations/Objective: Virtual Visit no exam  Assessment and Plan: Assessment  Primary Diagnosis & Pertinent Problem List: The primary encounter diagnosis was Chronic pain syndrome. A diagnosis of Sickle cell disease without crisis [facility]) was also pertinent to this visit.  Visit Diagnosis: 1. Chronic pain syndrome  Persistent controlled  2. Sickle cell disease without crisis (HCC)  Ensure adequate hydration. Move frequently to reduce venous thromboembolism risk. Avoid situations that could lead to dehydration or could exacerbate pain Discussed S&S of infection, seizures, stroke acute chest, DVT and how important it is to seek medical attention Take medication as directed along with pain contract and overall compliance Discussed the risk related to opiate use (addition, tolerance and dependency)       Follow Up Instructions: As scheduled   I discussed the assessment and treatment plan  with the patient. The patient was provided an opportunity to ask questions and all were answered. The patient agreed with the plan and demonstrated an understanding of the instructions.   The patient was advised to call back or seek an in-person evaluation if the symptoms worsen or if the condition fails to improve as anticipated.  I provided 12 minutes of non-face-to-face time during this encounter.   Barbette Merino, NP

## 2021-01-07 ENCOUNTER — Encounter: Payer: Medicaid Other | Admitting: Nurse Practitioner

## 2021-01-15 ENCOUNTER — Other Ambulatory Visit: Payer: Self-pay

## 2021-01-15 ENCOUNTER — Telehealth: Payer: Self-pay | Admitting: Nurse Practitioner

## 2021-01-15 ENCOUNTER — Other Ambulatory Visit: Payer: Self-pay | Admitting: Nurse Practitioner

## 2021-01-15 DIAGNOSIS — D5720 Sickle-cell/Hb-C disease without crisis: Secondary | ICD-10-CM

## 2021-01-15 DIAGNOSIS — G894 Chronic pain syndrome: Secondary | ICD-10-CM

## 2021-01-15 NOTE — Telephone Encounter (Signed)
Please see refill request.

## 2021-01-16 ENCOUNTER — Other Ambulatory Visit: Payer: Self-pay | Admitting: Nurse Practitioner

## 2021-01-16 DIAGNOSIS — D5720 Sickle-cell/Hb-C disease without crisis: Secondary | ICD-10-CM

## 2021-01-16 DIAGNOSIS — G894 Chronic pain syndrome: Secondary | ICD-10-CM

## 2021-01-16 MED ORDER — OXYCODONE HCL 15 MG PO TABS: 15.0000 mg | ORAL_TABLET | Freq: Four times a day (QID) | ORAL | 0 refills | Status: DC | PRN

## 2021-01-16 NOTE — Telephone Encounter (Signed)
Sent!

## 2021-01-19 ENCOUNTER — Encounter: Payer: Self-pay | Admitting: Nurse Practitioner

## 2021-01-19 ENCOUNTER — Ambulatory Visit (INDEPENDENT_AMBULATORY_CARE_PROVIDER_SITE_OTHER): Payer: Medicaid Other | Admitting: Nurse Practitioner

## 2021-01-19 ENCOUNTER — Other Ambulatory Visit: Payer: Self-pay

## 2021-01-19 VITALS — BP 120/77 | HR 68 | Temp 97.7°F | Ht 63.0 in | Wt 147.0 lb

## 2021-01-19 DIAGNOSIS — Z1239 Encounter for other screening for malignant neoplasm of breast: Secondary | ICD-10-CM | POA: Diagnosis not present

## 2021-01-19 DIAGNOSIS — Z01419 Encounter for gynecological examination (general) (routine) without abnormal findings: Secondary | ICD-10-CM

## 2021-01-19 NOTE — Progress Notes (Signed)
   [facility] Patient [facility] [address] [name] [place], [place]  [address] Phone:  [phone]   Fax:  [phone] Subjective:     Hannah Hawkins is a 35 y.o. female and is here for a comprehensive physical exam. The patient reports no problems.  Social History   Socioeconomic History  . Marital status: Single    Spouse name: Not on file  . Number of children: Not on file  . Years of education: Not on file  . Highest education level: Not on file  Occupational History  . Not on file  Tobacco Use  . Smoking status: Former Smoker    Types: Cigarettes  . Smokeless tobacco: Never Used  Vaping Use  . Vaping Use: Never used  Substance and Sexual Activity  . Alcohol use: Not Currently  . Drug use: No  . Sexual activity: Yes  Other Topics Concern  . Not on file  Social History Narrative  . Not on file   Social Determinants of Health   Financial Resource Strain: Not on file  Food Insecurity: Not on file  Transportation Needs: Not on file  Physical Activity: Not on file  Stress: Not on file  Social Connections: Not on file  Intimate Partner Violence: Not on file   Health Maintenance  Topic Date Due  . COVID-19 Vaccine (1) Never done  . PAP SMEAR-Modifier  07/26/2022  . TETANUS/TDAP  02/19/2027  . Hepatitis C Screening  Completed  . HIV Screening  Completed    The following portions of the patient's history were reviewed and updated as appropriate: allergies, current medications, past family history, past medical history, past social history, past surgical history and problem list.  Review of Systems Pertinent items are noted in HPI.   Objective:    BP 120/77 (BP Location: Left Arm, Patient Position: Sitting, Cuff Size: Normal)   Pulse 68   Temp 97.7 F (36.5 C) (Temporal)   Ht 5\' 3"  (1.6 m)   Wt 147 lb (66.7 kg)   LMP 12/28/2020   SpO2 97%   BMI 26.04 kg/m  General appearance: alert, cooperative, appears stated age and no distress Neck: no adenopathy,  no carotid bruit, no JVD, supple, symmetrical, trachea midline and thyroid not enlarged, symmetric, no tenderness/mass/nodules Breasts: normal appearance, no masses or tenderness Pelvic: cervix normal in appearance, external genitalia normal, no adnexal masses or tenderness, no cervical motion tenderness, rectovaginal septum normal, uterus normal size, shape, and consistency and vagina normal without discharge Skin: Skin color, texture, turgor normal. No rashes or lesions    Assessment:    Healthy female exam.   Plan:     See After Visit Summary for Counseling Recommendations

## 2021-01-20 LAB — POCT URINALYSIS DIPSTICK
Bilirubin, UA: NEGATIVE
Blood, UA: NEGATIVE
Glucose, UA: NEGATIVE
Ketones, UA: NEGATIVE
Leukocytes, UA: NEGATIVE
Nitrite, UA: NEGATIVE
Protein, UA: NEGATIVE
Spec Grav, UA: 1.025 (ref 1.010–1.025)
Urobilinogen, UA: 0.2 E.U./dL
pH, UA: 6 (ref 5.0–8.0)

## 2021-01-20 NOTE — Addendum Note (Signed)
Addended by: Winn Jock on: 01/20/2021 09:24 AM   Modules accepted: Orders

## 2021-01-27 ENCOUNTER — Telehealth: Payer: Self-pay | Admitting: Nurse Practitioner

## 2021-01-27 LAB — IGP, CTNG, RFX APTIMA HPV ASCU
Chlamydia, Nuc. Acid Amp: NEGATIVE
Gonococcus by Nucleic Acid Amp: NEGATIVE

## 2021-01-27 LAB — HPV APTIMA: HPV Aptima: NEGATIVE

## 2021-01-28 ENCOUNTER — Other Ambulatory Visit: Payer: Self-pay | Admitting: Nurse Practitioner

## 2021-01-28 ENCOUNTER — Telehealth: Payer: Self-pay | Admitting: Nurse Practitioner

## 2021-01-28 MED ORDER — MORPHINE SULFATE ER 30 MG PO TBCR: 30.0000 mg | EXTENDED_RELEASE_TABLET | Freq: Two times a day (BID) | ORAL | 0 refills | Status: DC | PRN

## 2021-01-28 NOTE — Telephone Encounter (Signed)
Sent!

## 2021-01-28 NOTE — Telephone Encounter (Signed)
Sent to provider 

## 2021-01-29 ENCOUNTER — Other Ambulatory Visit: Payer: Self-pay

## 2021-01-29 DIAGNOSIS — G894 Chronic pain syndrome: Secondary | ICD-10-CM

## 2021-01-29 DIAGNOSIS — D5720 Sickle-cell/Hb-C disease without crisis: Secondary | ICD-10-CM

## 2021-01-29 NOTE — Telephone Encounter (Signed)
Sent to provider 

## 2021-01-30 ENCOUNTER — Telehealth: Payer: Self-pay | Admitting: Nurse Practitioner

## 2021-01-30 ENCOUNTER — Other Ambulatory Visit: Payer: Self-pay | Admitting: Nurse Practitioner

## 2021-01-30 DIAGNOSIS — D5720 Sickle-cell/Hb-C disease without crisis: Secondary | ICD-10-CM

## 2021-01-30 DIAGNOSIS — G894 Chronic pain syndrome: Secondary | ICD-10-CM

## 2021-01-30 MED ORDER — OXYCODONE HCL 15 MG PO TABS: 15.0000 mg | ORAL_TABLET | Freq: Four times a day (QID) | ORAL | 0 refills | Status: DC | PRN

## 2021-01-30 NOTE — Telephone Encounter (Signed)
Sent!

## 2021-01-30 NOTE — Telephone Encounter (Signed)
Patient requests refill on Oxycodone 15mg . States it is due on 01/31/21

## 2021-02-13 ENCOUNTER — Telehealth: Payer: Self-pay

## 2021-02-13 ENCOUNTER — Other Ambulatory Visit: Payer: Self-pay

## 2021-02-13 ENCOUNTER — Other Ambulatory Visit: Payer: Self-pay | Admitting: Family Medicine

## 2021-02-13 DIAGNOSIS — D5720 Sickle-cell/Hb-C disease without crisis: Secondary | ICD-10-CM

## 2021-02-13 DIAGNOSIS — G894 Chronic pain syndrome: Secondary | ICD-10-CM

## 2021-02-13 MED ORDER — OXYCODONE HCL 15 MG PO TABS: 15.0000 mg | ORAL_TABLET | Freq: Four times a day (QID) | ORAL | 0 refills | Status: DC | PRN

## 2021-02-13 NOTE — Telephone Encounter (Signed)
Refill Oxycodone 15mg 

## 2021-02-18 ENCOUNTER — Other Ambulatory Visit: Payer: Self-pay | Admitting: Nurse Practitioner

## 2021-02-18 DIAGNOSIS — R87620 Atypical squamous cells of undetermined significance on cytologic smear of vagina (ASC-US): Secondary | ICD-10-CM

## 2021-02-24 ENCOUNTER — Encounter (HOSPITAL_COMMUNITY): Payer: Self-pay

## 2021-02-24 ENCOUNTER — Other Ambulatory Visit: Payer: Self-pay

## 2021-02-24 ENCOUNTER — Emergency Department (HOSPITAL_COMMUNITY)
Admission: EM | Admit: 2021-02-24 | Discharge: 2021-02-24 | Disposition: A | Discharge status: Home / Self Care | Payer: Medicaid Other | Attending: Emergency Medicine | Admitting: Emergency Medicine

## 2021-02-24 DIAGNOSIS — Z87891 Personal history of nicotine dependence: Secondary | ICD-10-CM | POA: Insufficient documentation

## 2021-02-24 DIAGNOSIS — D57219 Sickle-cell/Hb-C disease with crisis, unspecified: Secondary | ICD-10-CM | POA: Diagnosis present

## 2021-02-24 DIAGNOSIS — D5700 Hb-SS disease with crisis, unspecified: Secondary | ICD-10-CM

## 2021-02-24 LAB — COMPREHENSIVE METABOLIC PANEL
ALT: 12 U/L (ref 0–44)
AST: 25 U/L (ref 15–41)
Albumin: 4.8 g/dL (ref 3.5–5.0)
Alkaline Phosphatase: 51 U/L (ref 38–126)
Anion gap: 13 (ref 5–15)
BUN: <5 mg/dL — ABNORMAL LOW (ref 6–20)
CO2: 22 mmol/L (ref 22–32)
Calcium: 9.5 mg/dL (ref 8.9–10.3)
Chloride: 103 mmol/L (ref 98–111)
Creatinine, Ser: 0.71 mg/dL (ref 0.44–1.00)
GFR, Estimated: >60 mL/min (ref >60)
Glucose, Bld: 103 mg/dL — ABNORMAL HIGH (ref 70–99)
Potassium: 3.3 mmol/L — ABNORMAL LOW (ref 3.5–5.1)
Sodium: 138 mmol/L (ref 135–145)
Total Bilirubin: 0.8 mg/dL (ref 0.3–1.2)
Total Protein: 8.1 g/dL (ref 6.5–8.1)

## 2021-02-24 LAB — CBC WITH DIFFERENTIAL/PLATELET
Abs Immature Granulocytes: 0.05 10*3/uL (ref 0.00–0.07)
Basophils Absolute: 0.1 10*3/uL (ref 0.0–0.1)
Basophils Relative: 1 %
Eosinophils Absolute: 0.8 10*3/uL — ABNORMAL HIGH (ref 0.0–0.5)
Eosinophils Relative: 6 %
HCT: 30.4 % — ABNORMAL LOW (ref 36.0–46.0)
Hemoglobin: 11.3 g/dL — ABNORMAL LOW (ref 12.0–15.0)
Immature Granulocytes: 0 %
Lymphocytes Relative: 15 %
Lymphs Abs: 2 10*3/uL (ref 0.7–4.0)
MCH: 26.8 pg (ref 26.0–34.0)
MCHC: 37.2 g/dL — ABNORMAL HIGH (ref 30.0–36.0)
MCV: 72.2 fL — ABNORMAL LOW (ref 80.0–100.0)
Monocytes Absolute: 0.8 10*3/uL (ref 0.1–1.0)
Monocytes Relative: 6 %
Neutro Abs: 9.6 10*3/uL — ABNORMAL HIGH (ref 1.7–7.7)
Neutrophils Relative %: 72 %
Platelets: 478 10*3/uL — ABNORMAL HIGH (ref 150–400)
RBC: 4.21 MIL/uL (ref 3.87–5.11)
RDW: 13.2 % (ref 11.5–15.5)
WBC: 13.3 10*3/uL — ABNORMAL HIGH (ref 4.0–10.5)
nRBC: 0.5 % — ABNORMAL HIGH (ref 0.0–0.2)

## 2021-02-24 LAB — APTT: aPTT: 30 seconds (ref 24–36)

## 2021-02-24 LAB — RETICULOCYTES
Immature Retic Fract: 20.1 % — ABNORMAL HIGH (ref 2.3–15.9)
RBC.: 4.2 MIL/uL (ref 3.87–5.11)
Retic Count, Absolute: 108.4 10*3/uL (ref 19.0–186.0)
Retic Ct Pct: 2.6 % (ref 0.4–3.1)

## 2021-02-24 LAB — PROTIME-INR
INR: 1.1 (ref 0.8–1.2)
Prothrombin Time: 13.4 seconds (ref 11.4–15.2)

## 2021-02-24 LAB — I-STAT BETA HCG BLOOD, ED (MC, WL, AP ONLY): I-stat hCG, quantitative: <5 m[IU]/mL (ref <5)

## 2021-02-24 MED ORDER — ONDANSETRON 4 MG PO TBDP: 4.0000 mg | ORAL_TABLET | Freq: Once | ORAL | Status: DC

## 2021-02-24 MED ORDER — HYDROMORPHONE HCL 2 MG/ML IJ SOLN
2.0000 mg | INTRAMUSCULAR | Status: AC
  Administered 2021-02-24: 2 mg via INTRAVENOUS
  Filled 2021-02-24: qty 1

## 2021-02-24 MED ORDER — HYDROMORPHONE HCL 2 MG/ML IJ SOLN
2.0000 mg | Freq: Once | INTRAMUSCULAR | Status: AC
  Administered 2021-02-24: 2 mg via INTRAVENOUS
  Filled 2021-02-24: qty 1

## 2021-02-24 MED ORDER — DIPHENHYDRAMINE HCL 50 MG/ML IJ SOLN
25.0000 mg | Freq: Once | INTRAMUSCULAR | Status: AC
  Administered 2021-02-24: 25 mg via INTRAVENOUS
  Filled 2021-02-24: qty 1

## 2021-02-24 MED ORDER — ONDANSETRON HCL 4 MG/2ML IJ SOLN
INTRAMUSCULAR | Status: AC
  Administered 2021-02-24: 4 mg
  Filled 2021-02-24: qty 2

## 2021-02-24 NOTE — ED Provider Notes (Signed)
[place] DEPT Provider Note   CSN: [id number] Arrival date & time: 02/24/21  2138     History Chief Complaint  Patient presents with  . Sickle Cell Pain Crisis    Hannah Hawkins is a 35 y.o. female.  HPI 35 year old female with a history of sickle cell anemia presents to the ER with a sickle cell pain crisis.  She complains of cramps and aching to her legs.  She states normally her crises will happen in her right leg, however today it is bilateral.  She is in severe pain, crying, moaning.  She states that she had run out of her morphine, has a refill pending tomorrow.  She had taken ibuprofen, tizanidine, and Lidoderm patch with cream with little relief.  Denies any chest pain or shortness of breath.  No fevers or chills, no cough, vomiting, no dysuria or hematuria, no diarrhea.  She has required admissions in the past for sickle cell pain crises    Past Medical History:  Diagnosis Date  . Sickle cell anemia [employer])     Patient Active Problem List   Diagnosis Date Noted  . Epigastric pain   . Lactic acidosis   . RUQ abdominal pain   . Rhabdomyolysis 08/07/2020  . Sickle cell crisis ([place]) 08/06/2020  . Elevated LFTs 08/06/2020  . 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 Leukocytosis 08/06/2020  . SIRS (systemic inflammatory response syndrome) ([place]) 08/06/2020  . Non-intractable vomiting   . Joint pain in fingers of both hands 06/11/2020  . Hypokalemia 07/17/2019  . GERD (gastroesophageal reflux disease) 05/29/2019  . History of cocaine use 05/18/2019  . Chronic pain syndrome   . Cocaine use   . Sickle cell pain crisis ([place]) 11/14/2017  . Anemia of chronic disease 11/13/2017  . Sinusitis, chronic 11/13/2017  . 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 EYE SURGERY  03/09/2017   left eye  . EYE SURGERY Right 09/2019   right hemerage      OB History    Gravida  2   Para      Term       Preterm      AB      Living  1     SAB      IAB      Ectopic      Multiple      Live Births              Family History  Problem Relation Age of Onset  . Diabetes Father     Social History   Tobacco Use  . Smoking status: Former Smoker    Types: Cigarettes  . Smokeless tobacco: Never Used  Vaping Use  . Vaping Use: Never used  Substance Use Topics  . Alcohol use: Not Currently  . Drug use: No    Home Medications Prior to Admission medications   Medication Sig Start Date End Date Taking? Authorizing Provider  citalopram (CELEXA) 20 MG tablet Take 1 tablet (20 mg total) by mouth daily. 09/03/20 09/03/21  Vevelyn Francois, NP  ergocalciferol (VITAMIN D2) 1.25 MG (50000 UT) capsule Take 1 capsule (50,000 Units total) by mouth once a week. 05/10/19   Lanae Boast, FNP  ibuprofen (ADVIL) 800 MG tablet Take 1 tablet (800 mg total) by mouth 3 (three) times daily. 09/25/20 09/25/21  Vevelyn Francois, NP  levocetirizine (XYZAL) 5 MG tablet Take 5 mg by mouth every  evening.    [provider]  lidocaine (LIDODERM) 5 % Place 1 patch onto the skin daily. Remove & Discard patch within 12 hours or as directed by MD Patient taking differently: Place 1 patch onto the skin every 12 (twelve) hours as needed (pain). Remove & Discard patch within 12 hours or as directed by MD 05/16/20   Vevelyn Francois, NP  montelukast (SINGULAIR) 10 MG tablet Take 1 tablet (10 mg total) by mouth at bedtime. 05/02/20   Vevelyn Francois, NP  morphine (MS CONTIN) 30 MG 12 hr tablet Take 1 tablet (30 mg total) by mouth 2 (two) times daily as needed for pain. 01/28/21 02/27/21  Vevelyn Francois, NP  NARCAN 4 MG/0.1ML LIQD nasal spray kit Place 4 mg into the nose once as needed (overdose). 12/22/17   [provider]  oxyCODONE (ROXICODONE) 15 MG immediate release tablet Take 1 tablet (15 mg total) by mouth every 6 (six) hours as needed for up to 15 days for pain (moderate to severe pain). 02/13/21  02/28/21  Azzie Glatter, FNP  promethazine (PHENERGAN) 12.5 MG tablet Take 1 tablet (12.5 mg total) by mouth every 4 (four) hours as needed for nausea or vomiting. 08/10/20   Azzie Glatter, FNP  tiZANidine (ZANAFLEX) 2 MG tablet TAKE 1 TABLET(2 MG) BY MOUTH EVERY 8 HOURS AS NEEDED FOR MUSCLE SPASMS 01/15/21   Vevelyn Francois, NP    Allergies    Penicillins  Review of Systems   Review of Systems  Constitutional: Negative for chills and fever.  HENT: Negative for ear pain and sore throat.   Eyes: Negative for pain and visual disturbance.  Respiratory: Negative for cough and shortness of breath.   Cardiovascular: Negative for chest pain and palpitations.  Gastrointestinal: Negative for abdominal pain and vomiting.  Genitourinary: Negative for dysuria and hematuria.  Musculoskeletal: Positive for arthralgias. Negative for back pain.  Skin: Negative for color change and rash.  Neurological: Negative for seizures and syncope.  All other systems reviewed and are negative.   Physical Exam Updated Vital Signs BP 139/90   Pulse 83   Temp 97.8 F (36.6 C) (Oral)   Resp 13   Ht _0  (1.6 m)   Wt 67.1 kg   SpO2 99%   BMI 26.22 kg/m   Physical Exam Vitals and nursing note reviewed.  Constitutional:      General: She is not in acute distress.    Appearance: She is well-developed and well-nourished.     Comments: Tearful, moaning, crying  HENT:     Head: Normocephalic and atraumatic.  Eyes:     Conjunctiva/sclera: Conjunctivae normal.  Cardiovascular:     Rate and Rhythm: Normal rate and regular rhythm.     Heart sounds: No murmur heard.   Pulmonary:     Effort: Pulmonary effort is normal. No respiratory distress.     Breath sounds: Normal breath sounds.  Abdominal:     Palpations: Abdomen is soft.     Tenderness: There is no abdominal tenderness.  Musculoskeletal:        General: No swelling, tenderness or edema.     Cervical back: Neck supple.  Skin:    General:  Skin is warm and dry.  Neurological:     General: No focal deficit present.     Mental Status: She is alert and oriented to person, place, and time.  Psychiatric:        Mood and Affect: Mood and affect  and mood normal.        Behavior: Behavior normal.     ED Results / Procedures / Treatments   Labs (all labs ordered are listed, but only abnormal results are displayed) Labs Reviewed  CBC WITH DIFFERENTIAL/PLATELET - Abnormal; Notable for the following components:      Result Value   WBC 13.3 (*)    Hemoglobin 11.3 (*)    HCT 30.4 (*)    MCV 72.2 (*)    MCHC 37.2 (*)    Platelets 478 (*)    nRBC 0.5 (*)    Neutro Abs 9.6 (*)    Eosinophils Absolute 0.8 (*)    All other components within normal limits  RETICULOCYTES - Abnormal; Notable for the following components:   Immature Retic Fract 20.1 (*)    All other components within normal limits  COMPREHENSIVE METABOLIC PANEL - Abnormal; Notable for the following components:   Potassium 3.3 (*)    Glucose, Bld 103 (*)    BUN <5 (*)    All other components within normal limits  PROTIME-INR  APTT  I-STAT BETA HCG BLOOD, ED ([facility], [facility], [facility] ONLY)    EKG None  Radiology No results found.  Procedures Procedures   Medications Ordered in ED Medications  ondansetron (ZOFRAN-ODT) disintegrating tablet 4 mg (4 mg Oral Not Given 02/24/21 2208)  HYDROmorphone (DILAUDID) injection 2 mg (has no administration in time range)  HYDROmorphone (DILAUDID) injection 2 mg (2 mg Intravenous Given 02/24/21 2208)  HYDROmorphone (DILAUDID) injection 2 mg (2 mg Intravenous Given 02/24/21 2245)  diphenhydrAMINE (BENADRYL) injection 25 mg (25 mg Intravenous Given 02/24/21 2208)  ondansetron (ZOFRAN) 4 MG/2ML injection (4 mg  Given 02/24/21 2208)    ED Course  I have reviewed the triage vital signs and the nursing notes.  Pertinent labs & imaging results that were available during my care of the patient were reviewed by me and considered in my medical  decision making (see chart for details).    MDM Rules/Calculators/A&P                          Patient presenting with sickle cell crisis. Patient with typical symptoms and pain. Pt without CP, abdominal pain, or SOB. Pt is not exhibiting signs or symptoms of acute chest syndrome, organ failure,  or DVT. Pt is afebrile, hemodynamically stable. WBC slightly elevated at 13.3, likely reactive. Retic count normal. Hgb without change from baseline. Patient treated with sickle cell protocol, with improvement in symptoms 2 rounds of Dilaudid.  She would like to have a third round, states she is stable for discharge.  Pt tolerating PO. Pt is safe for discharge with symptomatic management. Strict return precautions discussed.  Final Clinical Impression(s) / ED Diagnoses Final diagnoses:  Sickle cell pain crisis [facility])    Rx / DC Orders ED Discharge Orders    None       Lyndel Safe 02/24/21 2307    Sherwood Gambler, MD 02/26/21 [phone]

## 2021-02-24 NOTE — Discharge Instructions (Addendum)
You were evaluated in the Emergency Department and after careful evaluation, we did not find any emergent condition requiring admission or further testing in the hospital. ° °Please return to the Emergency Department if you experience any worsening of your condition.  We encourage you to follow up with a primary care provider.  Thank you for allowing us to be a part of your care. °

## 2021-02-25 ENCOUNTER — Other Ambulatory Visit: Payer: Self-pay | Admitting: Family Medicine

## 2021-02-25 ENCOUNTER — Telehealth: Payer: Self-pay

## 2021-02-25 ENCOUNTER — Other Ambulatory Visit: Payer: Self-pay

## 2021-02-25 DIAGNOSIS — G894 Chronic pain syndrome: Secondary | ICD-10-CM

## 2021-02-25 DIAGNOSIS — D5720 Sickle-cell/Hb-C disease without crisis: Secondary | ICD-10-CM

## 2021-02-25 NOTE — Telephone Encounter (Signed)
Transition Care Management Follow-up Telephone Call  Date of discharge and from where: 02/24/2021 from [facility]  How have you been since you were released from the hospital? Pt stated that she is feeling better but still has pain and swelling. Pt stated that she has been laying down and resting since she has been home. Pt did not have any questions, only concern (see below).   Any questions or concerns? Yes   Pt stated that she was mistreated and embarrassed at the hospital. Pt was told to take her time after discharge. Pt was waiting for her transport. Pt was told shortly after discharge that she needed to leave and that they may need the room. Pt states that she still had her gown on and the door was left open. Pt stated that she asked for the door to closed so she could change into her clothes. Pt stated security then came in and told patient that she needed to go and that they need to leave. She informed them that she needed to door closed so she could change. Pt stated that she changed her clothes and waked out of the door and was then meet by 7 security guards. Security stated they were informed that pt refused to leave. Pt felt very embarrassed and felt that she was rushed out and placed outside in the rain. Pt stated that she was still in pain and it was very difficult to walk.   I have sent a message to leadership with patients concern.   Items Reviewed:  Did the pt receive and understand the discharge instructions provided? Yes   Medications obtained and verified? Yes   Other? No   Any new allergies since your discharge? No   Dietary orders reviewed? n/a  Do you have support at home? Yes   Functional Questionnaire: (I = Independent and D = Dependent) ADLs: I  Bathing/Dressing- I - patient has only been laying down since she has been home.   Meal Prep- D - daughter is preparing meals for patient.   Eating- I  Maintaining continence- I  Transferring/Ambulation- I - it  is difficult with the pain.   Managing Meds- I  Follow up appointments reviewed:   PCP Hospital f/u appt confirmed? No  Scheduled to see Thad Ranger, NP on 04/16/2021 @ 10:40am.  Specialist Hospital f/u appt confirmed? No    Are transportation arrangements needed? No   If their condition worsens, is the pt aware to call PCP or go to the Emergency Dept.? Yes  Was the patient provided with contact information for the PCP's office or ED? Yes  Was to pt encouraged to call back with questions or concerns? Yes   Telephone encounter routed to myself and PCP.

## 2021-02-25 NOTE — Telephone Encounter (Signed)
Morphine 30 mg MED REFILL

## 2021-02-25 NOTE — Telephone Encounter (Signed)
Thank you for this information. I am here to support in anyway.

## 2021-02-26 ENCOUNTER — Other Ambulatory Visit: Payer: Self-pay | Admitting: Nurse Practitioner

## 2021-02-26 MED ORDER — MORPHINE SULFATE ER 30 MG PO TBCR: 30.0000 mg | EXTENDED_RELEASE_TABLET | Freq: Two times a day (BID) | ORAL | 0 refills | Status: AC | PRN

## 2021-02-26 MED ORDER — OXYCODONE HCL 15 MG PO TABS: 15.0000 mg | ORAL_TABLET | Freq: Four times a day (QID) | ORAL | 0 refills | Status: DC | PRN

## 2021-02-26 NOTE — Telephone Encounter (Signed)
Called patient today and gave her the number for Patient Experience. [phone]

## 2021-02-26 NOTE — Telephone Encounter (Signed)
MyChart message was also sent . This was completed. Thanks

## 2021-02-27 ENCOUNTER — Telehealth: Payer: Self-pay | Admitting: Clinical

## 2021-02-27 NOTE — Telephone Encounter (Signed)
[facility] General Follow Up Note  02/27/2021 Name: Nolia Tschantz MRN: [id number] DOB: 1986/03/31 Shalan Neault is a 35 y.o. year old female who sees Barbette Merino, NP for primary care. LCSW was initially consulted to assess the patient's needs and assist patient with community resources.  Interpreter: No.   Interpreter Name & Language: none  Assessment: Patient was referred to CSW by Transitions of Care CMA regarding patient's experience in the ER the other day.  Ongoing Intervention: Today CSW called patient to assess. Patient stated she was provided with Patient Experience contact information by Transitions of Care CMA to report her experience in the ER. CSW assessed for other needs. Patient reported lately she has had some problems filling her medications at the pharmacy, sometimes due to the need for prior authorizations. CSW advised patient to reach out to our office if she runs into this next time she tries to fill her medication.    Review of patient status, including review of consultants reports, relevant laboratory and other test results, and collaboration with appropriate care team members and the patient's provider was performed as part of comprehensive patient evaluation and provision of services.    Follow up Plan: 1. CSW available from clinic as needed.  Abigail Butts, LCSW [facility] [phone]

## 2021-03-13 ENCOUNTER — Other Ambulatory Visit: Payer: Self-pay

## 2021-03-13 DIAGNOSIS — D5720 Sickle-cell/Hb-C disease without crisis: Secondary | ICD-10-CM

## 2021-03-13 DIAGNOSIS — G894 Chronic pain syndrome: Secondary | ICD-10-CM

## 2021-03-16 ENCOUNTER — Other Ambulatory Visit: Payer: Self-pay | Admitting: Nurse Practitioner

## 2021-03-16 ENCOUNTER — Other Ambulatory Visit: Payer: Self-pay

## 2021-03-16 ENCOUNTER — Telehealth: Payer: Self-pay | Admitting: Nurse Practitioner

## 2021-03-16 DIAGNOSIS — D5720 Sickle-cell/Hb-C disease without crisis: Secondary | ICD-10-CM

## 2021-03-16 DIAGNOSIS — G894 Chronic pain syndrome: Secondary | ICD-10-CM

## 2021-03-16 MED ORDER — OXYCODONE HCL 15 MG PO TABS: 15.0000 mg | ORAL_TABLET | Freq: Four times a day (QID) | ORAL | 0 refills | Status: DC | PRN

## 2021-03-16 NOTE — Telephone Encounter (Signed)
Sent!

## 2021-03-17 ENCOUNTER — Encounter (HOSPITAL_COMMUNITY): Payer: Self-pay | Admitting: Internal Medicine

## 2021-03-17 ENCOUNTER — Non-Acute Institutional Stay (HOSPITAL_BASED_OUTPATIENT_CLINIC_OR_DEPARTMENT_OTHER)
Admission: AD | Admit: 2021-03-17 | Discharge: 2021-03-17 | Disposition: A | Discharge status: Home / Self Care | Payer: Medicaid Other | Source: Ambulatory Visit | Attending: Internal Medicine | Admitting: Internal Medicine

## 2021-03-17 ENCOUNTER — Other Ambulatory Visit: Payer: Self-pay

## 2021-03-17 ENCOUNTER — Telehealth (HOSPITAL_COMMUNITY): Payer: Self-pay | Admitting: *Deleted

## 2021-03-17 ENCOUNTER — Emergency Department (HOSPITAL_COMMUNITY)
Admission: EM | Admit: 2021-03-17 | Discharge: 2021-03-17 | Disposition: A | Discharge status: Home / Self Care | Payer: Medicaid Other | Attending: Emergency Medicine | Admitting: Emergency Medicine

## 2021-03-17 ENCOUNTER — Telehealth: Payer: Self-pay

## 2021-03-17 DIAGNOSIS — D5700 Hb-SS disease with crisis, unspecified: Secondary | ICD-10-CM

## 2021-03-17 DIAGNOSIS — Z87891 Personal history of nicotine dependence: Secondary | ICD-10-CM | POA: Insufficient documentation

## 2021-03-17 DIAGNOSIS — F1120 Opioid dependence, uncomplicated: Secondary | ICD-10-CM | POA: Diagnosis not present

## 2021-03-17 DIAGNOSIS — D57219 Sickle-cell/Hb-C disease with crisis, unspecified: Secondary | ICD-10-CM | POA: Diagnosis present

## 2021-03-17 DIAGNOSIS — M79606 Pain in leg, unspecified: Secondary | ICD-10-CM | POA: Insufficient documentation

## 2021-03-17 DIAGNOSIS — Z880 Allergy status to penicillin: Secondary | ICD-10-CM | POA: Insufficient documentation

## 2021-03-17 DIAGNOSIS — Z79899 Other long term (current) drug therapy: Secondary | ICD-10-CM | POA: Insufficient documentation

## 2021-03-17 DIAGNOSIS — D638 Anemia in other chronic diseases classified elsewhere: Secondary | ICD-10-CM | POA: Insufficient documentation

## 2021-03-17 DIAGNOSIS — G894 Chronic pain syndrome: Secondary | ICD-10-CM | POA: Insufficient documentation

## 2021-03-17 DIAGNOSIS — R197 Diarrhea, unspecified: Secondary | ICD-10-CM | POA: Insufficient documentation

## 2021-03-17 LAB — CBC WITH DIFFERENTIAL/PLATELET
Abs Immature Granulocytes: 0.04 10*3/uL (ref 0.00–0.07)
Basophils Absolute: 0.1 10*3/uL (ref 0.0–0.1)
Basophils Relative: 1 %
Eosinophils Absolute: 0.1 10*3/uL (ref 0.0–0.5)
Eosinophils Relative: 1 %
HCT: 32 % — ABNORMAL LOW (ref 36.0–46.0)
Hemoglobin: 11.8 g/dL — ABNORMAL LOW (ref 12.0–15.0)
Immature Granulocytes: 0 %
Lymphocytes Relative: 20 %
Lymphs Abs: 2 10*3/uL (ref 0.7–4.0)
MCH: 26.9 pg (ref 26.0–34.0)
MCHC: 36.9 g/dL — ABNORMAL HIGH (ref 30.0–36.0)
MCV: 73.1 fL — ABNORMAL LOW (ref 80.0–100.0)
Monocytes Absolute: 0.7 10*3/uL (ref 0.1–1.0)
Monocytes Relative: 7 %
Neutro Abs: 7.1 10*3/uL (ref 1.7–7.7)
Neutrophils Relative %: 71 %
Platelets: 545 10*3/uL — ABNORMAL HIGH (ref 150–400)
RBC: 4.38 MIL/uL (ref 3.87–5.11)
RDW: 13.5 % (ref 11.5–15.5)
WBC: 10.1 10*3/uL (ref 4.0–10.5)
nRBC: 0.9 % — ABNORMAL HIGH (ref 0.0–0.2)

## 2021-03-17 LAB — I-STAT BETA HCG BLOOD, ED (MC, WL, AP ONLY): I-stat hCG, quantitative: <5 m[IU]/mL (ref <5)

## 2021-03-17 LAB — COMPREHENSIVE METABOLIC PANEL
ALT: 11 U/L (ref 0–44)
AST: 21 U/L (ref 15–41)
Albumin: 4.5 g/dL (ref 3.5–5.0)
Alkaline Phosphatase: 48 U/L (ref 38–126)
Anion gap: 9 (ref 5–15)
BUN: <5 mg/dL — ABNORMAL LOW (ref 6–20)
CO2: 26 mmol/L (ref 22–32)
Calcium: 9.6 mg/dL (ref 8.9–10.3)
Chloride: 107 mmol/L (ref 98–111)
Creatinine, Ser: 0.7 mg/dL (ref 0.44–1.00)
GFR, Estimated: >60 mL/min (ref >60)
Glucose, Bld: 99 mg/dL (ref 70–99)
Potassium: 3.5 mmol/L (ref 3.5–5.1)
Sodium: 142 mmol/L (ref 135–145)
Total Bilirubin: 0.6 mg/dL (ref 0.3–1.2)
Total Protein: 8.1 g/dL (ref 6.5–8.1)

## 2021-03-17 LAB — RETICULOCYTES
Immature Retic Fract: 16.3 % — ABNORMAL HIGH (ref 2.3–15.9)
RBC.: 4.38 MIL/uL (ref 3.87–5.11)
Retic Count, Absolute: 131.8 10*3/uL (ref 19.0–186.0)
Retic Ct Pct: 3 % (ref 0.4–3.1)

## 2021-03-17 MED ORDER — SODIUM CHLORIDE 0.45 % IV SOLN: INTRAVENOUS | Status: DC

## 2021-03-17 MED ORDER — ACETAMINOPHEN 500 MG PO TABS
1000.0000 mg | ORAL_TABLET | Freq: Once | ORAL | Status: AC
  Administered 2021-03-17: 1000 mg via ORAL
  Filled 2021-03-17: qty 2

## 2021-03-17 MED ORDER — ONDANSETRON HCL 4 MG/2ML IJ SOLN
4.0000 mg | Freq: Once | INTRAMUSCULAR | Status: AC
  Administered 2021-03-17: 4 mg via INTRAVENOUS
  Filled 2021-03-17: qty 2

## 2021-03-17 MED ORDER — HYDROMORPHONE HCL 2 MG/ML IJ SOLN
2.0000 mg | Freq: Once | INTRAMUSCULAR | Status: AC
  Administered 2021-03-17: 2 mg via INTRAVENOUS
  Filled 2021-03-17: qty 1

## 2021-03-17 MED ORDER — HYDROMORPHONE HCL 2 MG/ML IJ SOLN
2.0000 mg | INTRAMUSCULAR | Status: AC
  Administered 2021-03-17 (×3): 2 mg via SUBCUTANEOUS
  Filled 2021-03-17 (×3): qty 1

## 2021-03-17 MED ORDER — DEXTROSE-NACL 5-0.45 % IV SOLN: INTRAVENOUS | Status: DC

## 2021-03-17 MED ORDER — KETOROLAC TROMETHAMINE 30 MG/ML IJ SOLN
15.0000 mg | Freq: Once | INTRAMUSCULAR | Status: AC
  Administered 2021-03-17: 15 mg via INTRAVENOUS
  Filled 2021-03-17: qty 1

## 2021-03-17 NOTE — ED Notes (Signed)
Urine requested  ?

## 2021-03-17 NOTE — ED Notes (Signed)
Pt to sickle cell clinic now

## 2021-03-17 NOTE — Discharge Instructions (Signed)
Sickle Cell Anemia, Adult  Sickle cell anemia is a condition where your red blood cells are shaped like sickles. Red blood cells carry oxygen through the body. Sickle-shaped cells do not live as long as normal red blood cells. They also clump together and block blood from flowing through the blood vessels. This prevents the body from getting enough oxygen. Sickle cell anemia causes organ damage and pain. It also increases the risk of infection. Follow these instructions at home: Medicines  Take over-the-counter and prescription medicines only as told by your doctor.  If you were prescribed an antibiotic medicine, take it as told by your doctor. Do not stop taking the antibiotic even if you start to feel better.  If you develop a fever, do not take medicines to lower the fever right away. Tell your doctor about the fever. Managing pain, stiffness, and swelling  Try these methods to help with pain: ? Use a heating pad. ? Take a warm bath. ? Distract yourself, such as by watching TV. Eating and drinking  Drink enough fluid to keep your pee (urine) clear or pale yellow. Drink more in hot weather and during exercise.  Limit or avoid alcohol.  Eat a healthy diet. Eat plenty of fruits, vegetables, whole grains, and lean protein.  Take vitamins and supplements as told by your doctor. Traveling  When traveling, keep these with you: ? Your medical information. ? The names of your doctors. ? Your medicines.  If you need to take an airplane, talk to your doctor first. Activity  Rest often.  Avoid exercises that make your heart beat much faster, such as jogging. General instructions  Do not use products that have nicotine or tobacco, such as cigarettes and e-cigarettes. If you need help quitting, ask your doctor.  Consider wearing a medical alert bracelet.  Avoid being in high places (high altitudes), such as mountains.  Avoid very hot or cold temperatures.  Avoid places where the  temperature changes a lot.  Keep all follow-up visits as told by your doctor. This is important. Contact a doctor if:  A joint hurts.  Your feet or hands hurt or swell.  You feel tired (fatigued). Get help right away if:  You have symptoms of infection. These include: ? Fever. ? Chills. ? Being very tired. ? Irritability. ? Poor eating. ? Throwing up (vomiting).  You feel dizzy or faint.  You have new stomach pain, especially on the left side.  You have a an erection (priapism) that lasts more than 4 hours.  You have numbness in your arms or legs.  You have a hard time moving your arms or legs.  You have trouble talking.  You have pain that does not go away when you take medicine.  You are short of breath.  You are breathing fast.  You have a long-term cough.  You have pain in your chest.  You have a bad headache.  You have a stiff neck.  Your stomach looks bloated even though you did not eat much.  Your skin is pale.  You suddenly cannot see well. Summary  Sickle cell anemia is a condition where your red blood cells are shaped like sickles.  Follow your doctor's advice on ways to manage pain, food to eat, activities to do, and steps to take for safe travel.  Get medical help right away if you have any signs of infection, such as a fever. This information is not intended to replace advice given to you by   your health care provider. Make sure you discuss any questions you have with your health care provider. Document Revised: 05/01/2020 Document Reviewed: 05/01/2020 Elsevier Patient Education  2021 [employer].  

## 2021-03-17 NOTE — Progress Notes (Addendum)
Pt admitted to the day hospital, from the ED,  for treatment of sickle cell pain crisis. Pt reported pain to bilateral hips and legs 8/10. Pt given PO Benadryl and Tylenol, IV toradol and zofran, placed on Dilaudid PCA, and hydrated with IV fluids. At discharge patient reported pain a 5/10. PT declined AVS. Pt alert , oriented and ambulatory at discharge.

## 2021-03-17 NOTE — ED Provider Notes (Signed)
[facility] DEPT Provider Note   CSN: [id number] Arrival date & time: 03/17/21  1029     History Chief Complaint  Patient presents with  . Sickle Cell Pain Crisis    Hannah Hawkins is a 35 y.o. female.  The history is provided by the patient.  Sickle Cell Pain Crisis Location:  Lower extremity (pain that starts at the hips bilaterally and radiates down the legs to the ankles on both sides.  no joint or leg swelling) Severity:  Severe Onset quality:  Gradual Duration:  12 hours Similar to previous crisis episodes: yes   Timing:  Constant Progression:  Worsening Chronicity:  Recurrent Sickle cell genotype:  [place] Frequency of attacks:  Intermittent History of pulmonary emboli: no   Context: cold exposure   Context: not pregnancy   Context comment:  Out of her meds and will not be ready till later today Relieved by:  Nothing Worsened by:  Activity and movement Ineffective treatments: ibuprofen. Associated symptoms: no chest pain, no congestion, no cough, no nausea, no shortness of breath, no swelling of legs and no vomiting   Associated symptoms comment:  Mild abdominal pain and some loose stool yesterday.  No urinary complaints or vaginal d/c.  LMP 1 week ago Risk factors: no frequent admissions for fever, no frequent admissions for pain, no prior acute chest and no recent air travel        Past Medical History:  Diagnosis Date  . Sickle cell anemia [facility])     Patient Active Problem List   Diagnosis Date Noted  . Epigastric pain   . Lactic acidosis   . RUQ abdominal pain   . Rhabdomyolysis 08/07/2020  . Sickle cell crisis ([place]) 08/06/2020  . Elevated LFTs 08/06/2020  . 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 Leukocytosis 08/06/2020  . SIRS (systemic inflammatory response syndrome) ([place]) 08/06/2020  . Non-intractable vomiting   . Joint pain in fingers of both hands 06/11/2020  . Hypokalemia 07/17/2019  . GERD (gastroesophageal reflux disease)  05/29/2019  . History of cocaine use 05/18/2019  . Chronic pain syndrome   . Cocaine use   . Sickle cell pain crisis ([place]) 11/14/2017  . Anemia of chronic disease 11/13/2017  . Sinusitis, chronic 11/13/2017  . 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 EYE SURGERY  03/09/2017   left eye  . EYE SURGERY Right 09/2019   right hemerage      OB History    Gravida  2   Para      Term      Preterm      AB      Living  1     SAB      IAB      Ectopic      Multiple      Live Births              Family History  Problem Relation Age of Onset  . Diabetes Father     Social History   Tobacco Use  . Smoking status: Former Smoker    Types: Cigarettes  . Smokeless tobacco: Never Used  Vaping Use  . Vaping Use: Never used  Substance Use Topics  . Alcohol use: Not Currently  . Drug use: No    Home Medications Prior to Admission medications   Medication Sig Start Date End Date Taking? Authorizing Provider  morphine (MS CONTIN) 30 MG 12 hr tablet Take  1 tablet (30 mg total) by mouth 2 (two) times daily as needed for pain. 02/27/21 03/29/21  Vevelyn Francois, NP  citalopram (CELEXA) 20 MG tablet Take 1 tablet (20 mg total) by mouth daily. 09/03/20 09/03/21  Vevelyn Francois, NP  ergocalciferol (VITAMIN D2) 1.25 MG (50000 UT) capsule Take 1 capsule (50,000 Units total) by mouth once a week. 05/10/19   Lanae Boast, FNP  ibuprofen (ADVIL) 800 MG tablet Take 1 tablet (800 mg total) by mouth 3 (three) times daily. 09/25/20 09/25/21  Vevelyn Francois, NP  levocetirizine (XYZAL) 5 MG tablet Take 5 mg by mouth every evening.    [provider]  lidocaine (LIDODERM) 5 % Place 1 patch onto the skin daily. Remove & Discard patch within 12 hours or as directed by MD Patient taking differently: Place 1 patch onto the skin every 12 (twelve) hours as needed (pain). Remove & Discard patch within 12 hours or as directed by MD  05/16/20   Vevelyn Francois, NP  montelukast (SINGULAIR) 10 MG tablet Take 1 tablet (10 mg total) by mouth at bedtime. 05/02/20   Vevelyn Francois, NP  NARCAN 4 MG/0.1ML LIQD nasal spray kit Place 4 mg into the nose once as needed (overdose). 12/22/17   [provider]  oxyCODONE (ROXICODONE) 15 MG immediate release tablet Take 1 tablet (15 mg total) by mouth every 6 (six) hours as needed for up to 15 days for pain (moderate to severe pain). 03/17/21 04/01/21  Vevelyn Francois, NP  promethazine (PHENERGAN) 12.5 MG tablet Take 1 tablet (12.5 mg total) by mouth every 4 (four) hours as needed for nausea or vomiting. 08/10/20   Azzie Glatter, FNP  tiZANidine (ZANAFLEX) 2 MG tablet TAKE 1 TABLET(2 MG) BY MOUTH EVERY 8 HOURS AS NEEDED FOR MUSCLE SPASMS 02/26/21   Vevelyn Francois, NP    Allergies    Penicillins  Review of Systems   Review of Systems  HENT: Negative for congestion.   Respiratory: Negative for cough and shortness of breath.   Cardiovascular: Negative for chest pain.  Gastrointestinal: Negative for nausea and vomiting.  All other systems reviewed and are negative.   Physical Exam Updated Vital Signs BP (!) 126/58   Pulse 64   Temp 97.6 F (36.4 C) (Oral)   Resp 17   SpO2 100%   Physical Exam Vitals and nursing note reviewed.  Constitutional:      General: She is not in acute distress.    Appearance: She is well-developed and normal weight.     Comments: Appears in pain  HENT:     Head: Normocephalic and atraumatic.  Eyes:     Pupils: Pupils are equal, round, and reactive to light.  Cardiovascular:     Rate and Rhythm: Normal rate and regular rhythm.     Pulses: Normal pulses.     Heart sounds: Normal heart sounds. No murmur heard. No friction rub.  Pulmonary:     Effort: Pulmonary effort is normal.     Breath sounds: Normal breath sounds. No wheezing or rales.  Abdominal:     General: Bowel sounds are normal. There is no distension.     Palpations: Abdomen is  soft.     Tenderness: There is abdominal tenderness in the periumbilical area. There is no right CVA tenderness, left CVA tenderness, guarding or rebound.  Musculoskeletal:        General: No tenderness. Normal range of motion.     Comments: No  edema.  Pain with palpation of all the muscles in bilateral lower extremities but pulse intact  Skin:    General: Skin is warm and dry.     Findings: No rash.  Neurological:     Mental Status: She is alert and oriented to person, place, and time. Mental status is at baseline.     Cranial Nerves: No cranial nerve deficit.  Psychiatric:        Mood and Affect: Mood normal.        Behavior: Behavior normal.        Thought Content: Thought content normal.     ED Results / Procedures / Treatments   Labs (all labs ordered are listed, but only abnormal results are displayed) Labs Reviewed  CBC WITH DIFFERENTIAL/PLATELET - Abnormal; Notable for the following components:      Result Value   Hemoglobin 11.8 (*)    HCT 32.0 (*)    MCV 73.1 (*)    MCHC 36.9 (*)    Platelets 545 (*)    All other components within normal limits  RETICULOCYTES - Abnormal; Notable for the following components:   Immature Retic Fract 16.3 (*)    All other components within normal limits  COMPREHENSIVE METABOLIC PANEL  URINALYSIS, ROUTINE W REFLEX MICROSCOPIC  I-STAT BETA HCG BLOOD, ED ([facility], [facility], [facility] ONLY)    EKG None  Radiology No results found.  Procedures Procedures   Medications Ordered in ED Medications  HYDROmorphone (DILAUDID) injection 2 mg (has no administration in time range)  ondansetron (ZOFRAN) injection 4 mg (has no administration in time range)  dextrose 5 %-0.45 % sodium chloride infusion (has no administration in time range)    ED Course  I have reviewed the triage vital signs and the nursing notes.  Pertinent labs & imaging results that were available during my care of the patient were reviewed by me and considered in my medical decision  making (see chart for details).    MDM Rules/Calculators/A&P                          Patient with a history of sickle cell disease who is presenting today with bilateral lower extremity pain.  Patient reports that he became severe in the middle of the night and she tried taking ibuprofen but it has not helped.  Patient notes that she ran out of her pain medication and prescriptions were sent to the pharmacy but it would not be available to later today.  She had had some mild abdominal cramping and some loose stool yesterday but denies any fever, nausea or vomiting.  Patient has no evidence of joint swelling or leg swelling.  Suspect sickle cell pain crisis related to change in weather.  Also suspect that patient's diarrhea and mild abdominal cramping is probably from lack of narcotics.  She has no significant abdominal pain concerning for acute abdominal pathology today.  Patient given a dose of pain medications.  Will call sickle cell clinic for admission to day hospital for pain control.  Labs are pending.  11:34 AM Hemoglobin is stable.  Spoke with [place] Hollis with the sickle cell day Hospital and she recommends collecting a urine prior to transfer but patient can come to the day hospital for ongoing pain control.  MDM Number of Diagnoses or Management Options   Amount and/or Complexity of Data Reviewed Clinical lab tests: ordered and reviewed Independent visualization of images, tracings, or specimens: yes  Final Clinical Impression(s) / ED Diagnoses Final diagnoses:  Sickle cell pain crisis [facility])    Rx / DC Orders ED Discharge Orders    None       Blanchie Dessert, MD 03/17/21 1135

## 2021-03-17 NOTE — ED Triage Notes (Signed)
EMS called out for sickle cell crisis. Pt reports the pain is all over her lower body. The pain started last night.

## 2021-03-17 NOTE — Telephone Encounter (Signed)
Submitted to [employer] [facility] for Oxycodone 15mg  tab qty 60 . Waiting on PA approval.

## 2021-03-17 NOTE — Telephone Encounter (Signed)
Patient called requesting to come to the day hospital for pain management. Patient reports abdominal and bilateral leg pain rated 10/10. Report that she had ran out of her pain medications and she last took them (Oxycodone and Ibuprofen) yesterday. Per patient, her medications will not be available for pick-up until 1:00 pm today. COVID-19 screening done and patient denies all symptoms and exposures. Admits to some nausea but denies vomiting, fever, chest pain and diarrhea. [place], FNP notified and advised that patient can come to the day hospital for pain management. Patient called and notified but reported that she had called EMS and would be going to the ED. Provider notified.

## 2021-03-17 NOTE — H&P (Signed)
[facility] History and Physical   Date: 03/17/2021  Patient name: Hannah Hawkins Medical record number: [id number] Date of birth: 07-13-1986 Age: 35 y.o. Gender: female PCP: Vevelyn Francois, NP  Attending physician: Tresa Garter, MD  Chief Complaint: Sickle cell pain   History of Present Illness: Hannah Hawkins is a 35 year old female with a medical history significant for sickle cell disease, chronic pain syndrome, opiate dependence and tolerance, history of polysubstance abuse, and history of anemia of chronic disease presented with complaints of lower extremity pain that is consistent with her previous sickle cell pain crisis.  Patient attributes crisis to running out of short acting opiate pain medications on yesterday.  Patient reported some diarrhea and mild abdominal cramping that is since resolved.  Patient's pain was unrelieved by Ibuprofen. Patient was evaluated in the ER this am. Agreed with ER provider that patient was appropriate to transition to sickle cell days clinic for pain management and extended observation. Patient rates pain as 8/10 characterized as constant and throbbing. She denies headache, chest pain, fever, or shortness of breath. No sick contacts, recent travel or exposure to COVID 19.  Meds: Medications Prior to Admission  Medication Sig Dispense Refill Last Dose  . citalopram (CELEXA) 20 MG tablet Take 1 tablet (20 mg total) by mouth daily. 30 tablet 11 Past Week at Unknown time  . ibuprofen (ADVIL) 800 MG tablet Take 1 tablet (800 mg total) by mouth 3 (three) times daily. 90 tablet 11 03/16/2021 at Unknown time  . morphine (MS CONTIN) 30 MG 12 hr tablet Take 1 tablet (30 mg total) by mouth 2 (two) times daily as needed for pain. 60 tablet 0 Past Week at Unknown time  . oxyCODONE (ROXICODONE) 15 MG immediate release tablet Take 1 tablet (15 mg total) by mouth every 6 (six) hours as needed for up to 15 days for pain (moderate to severe pain).  60 tablet 0 Past Week at Unknown time  . ergocalciferol (VITAMIN D2) 1.25 MG (50000 UT) capsule Take 1 capsule (50,000 Units total) by mouth once a week. 12 capsule 3   . levocetirizine (XYZAL) 5 MG tablet Take 5 mg by mouth every evening.     . lidocaine (LIDODERM) 5 % Place 1 patch onto the skin daily. Remove & Discard patch within 12 hours or as directed by MD (Patient taking differently: Place 1 patch onto the skin every 12 (twelve) hours as needed (pain). Remove & Discard patch within 12 hours or as directed by MD) 30 patch 2   . montelukast (SINGULAIR) 10 MG tablet Take 1 tablet (10 mg total) by mouth at bedtime. 30 tablet 3   . NARCAN 4 MG/0.1ML LIQD nasal spray kit Place 4 mg into the nose once as needed (overdose).  0   . promethazine (PHENERGAN) 12.5 MG tablet Take 1 tablet (12.5 mg total) by mouth every 4 (four) hours as needed for nausea or vomiting. 30 tablet 0   . tiZANidine (ZANAFLEX) 2 MG tablet TAKE 1 TABLET(2 MG) BY MOUTH EVERY 8 HOURS AS NEEDED FOR MUSCLE SPASMS 45 tablet 2     Allergies: Penicillins Past Medical History:  Diagnosis Date  . Sickle cell anemia ([facility])    Past Surgical History:  Procedure Laterality Date  . EYE SURGERY  03/09/2017   left eye  . EYE SURGERY Right 09/2019   right hemerage    Family History  Problem Relation Age of Onset  . Diabetes Father  Social History   Socioeconomic History  . Marital status: Single    Spouse name: Not on file  . Number of children: Not on file  . Years of education: Not on file  . Highest education level: Not on file  Occupational History  . Not on file  Tobacco Use  . Smoking status: Former Smoker    Types: Cigarettes  . Smokeless tobacco: Never Used  Vaping Use  . Vaping Use: Never used  Substance and Sexual Activity  . Alcohol use: Not Currently  . Drug use: No  . Sexual activity: Yes  Other Topics Concern  . Not on file  Social History Narrative  . Not on file   Social Determinants of Health    Financial Resource Strain: Not on file  Food Insecurity: Not on file  Transportation Needs: Not on file  Physical Activity: Not on file  Stress: Not on file  Social Connections: Not on file  Intimate Partner Violence: Not on file   Review of Systems  Constitutional: Negative.   HENT: Negative.   Eyes: Negative.   Respiratory: Negative.   Cardiovascular: Negative.   Gastrointestinal: Negative.   Genitourinary: Negative.   Musculoskeletal: Positive for back pain and joint pain.  Skin: Negative.   Neurological: Negative.   Endo/Heme/Allergies: Negative.   Psychiatric/Behavioral: Negative.     Physical Exam: Blood pressure (!) 134/92, pulse 60, temperature 97.8 F (36.6 C), temperature source Temporal, resp. rate 18, last menstrual period 03/03/2021, SpO2 100 %.  BP (!) 122/97 (BP Location: Right Arm)   Pulse 74   Temp 98 F (36.7 C) (Temporal)   Resp 16   LMP 03/03/2021   SpO2 100%   General Appearance:    Alert, cooperative, no distress, appears stated age  Head:    Normocephalic, without obvious abnormality, atraumatic  Eyes:    PERRL, conjunctiva/corneas clear, EOM's intact, fundi    benign, both eyes  Back:     Symmetric, no curvature, ROM normal, no CVA tenderness  Lungs:     Clear to auscultation bilaterally, respirations unlabored  Chest Wall:    No tenderness or deformity   Heart:    Regular rate and rhythm, S1 and S2 normal, no murmur, rub   or gallop  Abdomen:     Soft, non-tender, bowel sounds active all four quadrants,    no masses, no organomegaly  Extremities:   Extremities normal, atraumatic, no cyanosis or edema  Pulses:   2+ and symmetric all extremities  Skin:   Skin color, texture, turgor normal, no rashes or lesions  Lymph nodes:   Cervical, supraclavicular, and axillary nodes normal  Neurologic:   CNII-XII intact, normal strength, sensation and reflexes    throughout    Lab results: Results for orders placed or performed during the hospital  encounter of 03/17/21 (from the past 24 hour(s))  CBC with Differential/Platelet     Status: Abnormal   Collection Time: 03/17/21 10:41 AM  Result Value Ref Range   WBC 10.1 4.0 - 10.5 K/uL   RBC 4.38 3.87 - 5.11 MIL/uL   Hemoglobin 11.8 (L) 12.0 - 15.0 g/dL   HCT 32.0 (L) 36.0 - 46.0 %   MCV 73.1 (L) 80.0 - 100.0 fL   MCH 26.9 26.0 - 34.0 pg   MCHC 36.9 (H) 30.0 - 36.0 g/dL   RDW 13.5 11.5 - 15.5 %   Platelets 545 (H) 150 - 400 K/uL   nRBC 0.9 (H) 0.0 - 0.2 %  Neutrophils Relative % 71 %   Neutro Abs 7.1 1.7 - 7.7 K/uL   Lymphocytes Relative 20 %   Lymphs Abs 2.0 0.7 - 4.0 K/uL   Monocytes Relative 7 %   Monocytes Absolute 0.7 0.1 - 1.0 K/uL   Eosinophils Relative 1 %   Eosinophils Absolute 0.1 0.0 - 0.5 K/uL   Basophils Relative 1 %   Basophils Absolute 0.1 0.0 - 0.1 K/uL   Immature Granulocytes 0 %   Abs Immature Granulocytes 0.04 0.00 - 0.07 K/uL   Polychromasia MARKED    Target Cells PRESENT   Comprehensive metabolic panel     Status: Abnormal   Collection Time: 03/17/21 10:41 AM  Result Value Ref Range   Sodium 142 135 - 145 mmol/L   Potassium 3.5 3.5 - 5.1 mmol/L   Chloride 107 98 - 111 mmol/L   CO2 26 22 - 32 mmol/L   Glucose, Bld 99 70 - 99 mg/dL   BUN <5 (L) 6 - 20 mg/dL   Creatinine, Ser 0.70 0.44 - 1.00 mg/dL   Calcium 9.6 8.9 - 10.3 mg/dL   Total Protein 8.1 6.5 - 8.1 g/dL   Albumin 4.5 3.5 - 5.0 g/dL   AST 21 15 - 41 U/L   ALT 11 0 - 44 U/L   Alkaline Phosphatase 48 38 - 126 U/L   Total Bilirubin 0.6 0.3 - 1.2 mg/dL   GFR, Estimated >60 >60 mL/min   Anion gap 9 5 - 15  Reticulocytes     Status: Abnormal   Collection Time: 03/17/21 10:41 AM  Result Value Ref Range   Retic Ct Pct 3.0 0.4 - 3.1 %   RBC. 4.38 3.87 - 5.11 MIL/uL   Retic Count, Absolute 131.8 19.0 - 186.0 K/uL   Immature Retic Fract 16.3 (H) 2.3 - 15.9 %  I-Stat beta hCG blood, ED     Status: None   Collection Time: 03/17/21 11:24 AM  Result Value Ref Range   I-stat hCG, quantitative  <5.0 <5 mIU/mL   Comment 3            Imaging results:  No results found.   Assessment & Plan: Patient admitted to sickle cell day infusion center for management of pain crisis.  Patient is opiate tolerant Dilaudid 2 mg SQ x 3 IV fluids, 0.45% saline at 100 ml/hr Toradol 15 mg IV times one dose Tylenol 1000 mg by mouth times one dose Review CBC with differential, complete metabolic panel, and reticulocytes as results become available.  Pain intensity will be reevaluated in context of functioning and relationship to baseline as care progresses If pain intensity remains elevated and/or sudden change in hemodynamic stability transition to inpatient services for higher level of care.    Donia Pounds  APRN, MSN, FNP-C [facility] [address] [place], [place] [address] [phone]   03/17/2021, 2:18 PM

## 2021-03-18 NOTE — Discharge Summary (Signed)
[facility] Discharge Summary   Patient ID: Brazil Voytko MRN: [id number] DOB/AGE: September 09, 1986 35 y.o.  Admit date: 03/17/2021 Discharge date: 03/18/2021  Primary Care Physician:  Vevelyn Francois, NP  Admission Diagnoses:  Active Problems:   Sickle cell pain crisis [facility])   Discharge Medications:  Allergies as of 03/17/2021      Reactions   Penicillins Swelling, Rash   Has patient had a PCN reaction causing immediate rash, facial/tongue/throat swelling, SOB or lightheadedness with hypotension: Yes Has patient had a PCN reaction causing severe rash involving mucus membranes or skin necrosis: Yes Has patient had a PCN reaction that required hospitalization No Has patient had a PCN reaction occurring within the last 10 years: no If all of the above answers are "NO", then may proceed with Cephalosporin use.      Medication List    TAKE these medications   citalopram 20 MG tablet Commonly known as: CeleXA Take 1 tablet (20 mg total) by mouth daily.   ergocalciferol 1.25 MG (50000 UT) capsule Commonly known as: VITAMIN D2 Take 1 capsule (50,000 Units total) by mouth once a week.   ibuprofen 800 MG tablet Commonly known as: ADVIL Take 1 tablet (800 mg total) by mouth 3 (three) times daily.   levocetirizine 5 MG tablet Commonly known as: XYZAL Take 5 mg by mouth every evening.   lidocaine 5 % Commonly known as: Lidoderm Place 1 patch onto the skin daily. Remove & Discard patch within 12 hours or as directed by MD What changed:   when to take this  reasons to take this   montelukast 10 MG tablet Commonly known as: SINGULAIR Take 1 tablet (10 mg total) by mouth at bedtime.   morphine 30 MG 12 hr tablet Commonly known as: MS CONTIN Take 1 tablet (30 mg total) by mouth 2 (two) times daily as needed for pain.   Narcan 4 MG/0.1ML Liqd nasal spray kit Generic drug: naloxone Place 4 mg into the nose once as needed (overdose).   oxyCODONE 15 MG immediate  release tablet Commonly known as: ROXICODONE Take 1 tablet (15 mg total) by mouth every 6 (six) hours as needed for up to 15 days for pain (moderate to severe pain).   promethazine 12.5 MG tablet Commonly known as: PHENERGAN Take 1 tablet (12.5 mg total) by mouth every 4 (four) hours as needed for nausea or vomiting.   tiZANidine 2 MG tablet Commonly known as: ZANAFLEX TAKE 1 TABLET(2 MG) BY MOUTH EVERY 8 HOURS AS NEEDED FOR MUSCLE SPASMS        Consults:  None  Significant Diagnostic Studies:  No results found.  History of present illness;  Hannah Hawkins is a 35 year old female with a medical history significant for sickle cell disease, chronic pain syndrome, opiate dependence and tolerance, history of polysubstance abuse, and history of anemia of chronic disease presented with complaints of lower extremity pain that is consistent with her previous sickle cell pain crisis.  Patient attributes crisis to running out of short acting opiate pain medications on yesterday.  Patient reported some diarrhea and mild abdominal cramping that is since resolved.  Patient's pain was unrelieved by Ibuprofen. Patient was evaluated in the ER this am. Agreed with ER provider that patient was appropriate to transition to sickle cell days clinic for pain management and extended observation. Patient rates pain as 8/10 characterized as constant and throbbing. She denies headache, chest pain, fever, or shortness of breath. No sick contacts, recent  travel or exposure to COVID 19.   [facility] Course: Patient admitted to sickle cell day clinic for management of sickle cell crisis.  Reviewed all laboratory values, consistent with baseline.  Pain managed with Dilaudid 2 mg SQ x 3 IV fluids 0.45% saline at 100 ml/hr Toradol 15 mg IV x 1 Tylenol 1000 mg x 1 Pain intensity decreased to 5/10. Patient does not warrant admission on today.  Patient is alert, oriented and ambulating without  assistance. She will discharge home in a hemodynamically stable conditions.   Discharge instructions:  Resume all home medications.   Follow up with PCP as previously  scheduled.   Discussed the importance of drinking 64 ounces of water daily, dehydration of red blood cells may lead further sickling.   Avoid all stressors that precipitate sickle cell pain crisis.     The patient was given clear instructions to go to ER or return to medical center if symptoms do not improve, worsen or new problems develop.    Physical Exam at Discharge:  BP (!) 122/97 (BP Location: Right Arm)   Pulse 74   Temp 98 F (36.7 C) (Temporal)   Resp 16   LMP 03/03/2021   SpO2 100%   Physical Exam Constitutional:      Appearance: Normal appearance.  Eyes:     Pupils: Pupils are equal, round, and reactive to light.  Cardiovascular:     Rate and Rhythm: Normal rate and regular rhythm.     Pulses: Normal pulses.  Pulmonary:     Effort: Pulmonary effort is normal.  Abdominal:     General: Bowel sounds are normal.  Musculoskeletal:        General: Normal range of motion.  Skin:    General: Skin is warm.  Neurological:     General: No focal deficit present.     Mental Status: She is alert. Mental status is at baseline.  Psychiatric:        Mood and Affect: Mood normal.        Thought Content: Thought content normal.        Judgment: Judgment normal.      Disposition at Discharge: Discharge disposition: 01-Home or Self Care       Discharge Orders: Discharge Instructions    Discharge patient   Complete by: As directed    Discharge disposition: 01-Home or Self Care   Discharge patient date: 03/17/2021      Condition at Discharge:   Stable  Time spent on Discharge:  Greater than 30 minutes.  Signed: Donia Pounds  APRN, MSN, FNP-C [facility] [address] [place], [place] [address] [phone]  03/18/2021, 1:40 PM

## 2021-03-30 ENCOUNTER — Other Ambulatory Visit: Payer: Self-pay | Admitting: Nurse Practitioner

## 2021-03-30 ENCOUNTER — Telehealth: Payer: Self-pay

## 2021-03-30 ENCOUNTER — Other Ambulatory Visit: Payer: Self-pay

## 2021-03-30 DIAGNOSIS — G894 Chronic pain syndrome: Secondary | ICD-10-CM

## 2021-03-30 DIAGNOSIS — D5720 Sickle-cell/Hb-C disease without crisis: Secondary | ICD-10-CM

## 2021-03-30 MED ORDER — MORPHINE SULFATE ER 30 MG PO TBCR: 30.0000 mg | EXTENDED_RELEASE_TABLET | Freq: Two times a day (BID) | ORAL | 0 refills | Status: AC | PRN

## 2021-03-30 MED ORDER — OXYCODONE HCL 15 MG PO TABS: 15.0000 mg | ORAL_TABLET | Freq: Four times a day (QID) | ORAL | 0 refills | Status: DC | PRN

## 2021-03-30 NOTE — Telephone Encounter (Signed)
sent 

## 2021-03-30 NOTE — Telephone Encounter (Signed)
Med refill  Morphine and OXYCODONE

## 2021-04-16 ENCOUNTER — Other Ambulatory Visit: Payer: Self-pay

## 2021-04-16 ENCOUNTER — Ambulatory Visit (INDEPENDENT_AMBULATORY_CARE_PROVIDER_SITE_OTHER): Payer: Medicaid Other | Admitting: Nurse Practitioner

## 2021-04-16 ENCOUNTER — Encounter: Payer: Self-pay | Admitting: Nurse Practitioner

## 2021-04-16 VITALS — BP 117/66 | HR 80 | Temp 97.5°F | Wt 139.0 lb

## 2021-04-16 DIAGNOSIS — G894 Chronic pain syndrome: Secondary | ICD-10-CM | POA: Diagnosis not present

## 2021-04-16 DIAGNOSIS — D5720 Sickle-cell/Hb-C disease without crisis: Secondary | ICD-10-CM

## 2021-04-16 DIAGNOSIS — Z79891 Long term (current) use of opiate analgesic: Secondary | ICD-10-CM

## 2021-04-16 DIAGNOSIS — N898 Other specified noninflammatory disorders of vagina: Secondary | ICD-10-CM

## 2021-04-16 DIAGNOSIS — R110 Nausea: Secondary | ICD-10-CM

## 2021-04-16 LAB — POCT URINALYSIS DIPSTICK
Bilirubin, UA: NEGATIVE
Blood, UA: NEGATIVE
Glucose, UA: NEGATIVE
Ketones, UA: NEGATIVE
Leukocytes, UA: NEGATIVE
Nitrite, UA: NEGATIVE
Protein, UA: NEGATIVE
Spec Grav, UA: >=1.03 — AB (ref 1.010–1.025)
Urobilinogen, UA: 0.2 E.U./dL
pH, UA: 6 (ref 5.0–8.0)

## 2021-04-16 MED ORDER — CITALOPRAM HYDROBROMIDE 20 MG PO TABS: 20.0000 mg | ORAL_TABLET | Freq: Every day | ORAL | 11 refills | Status: DC

## 2021-04-16 MED ORDER — LIDOCAINE 5 % EX PTCH: 1.0000 | MEDICATED_PATCH | CUTANEOUS | 2 refills | Status: DC

## 2021-04-16 MED ORDER — TIZANIDINE HCL 2 MG PO TABS: 2.0000 mg | ORAL_TABLET | Freq: Four times a day (QID) | ORAL | 2 refills | Status: DC | PRN

## 2021-04-16 MED ORDER — ONDANSETRON 8 MG PO TBDP: 8.0000 mg | ORAL_TABLET | Freq: Two times a day (BID) | ORAL | 5 refills | Status: AC

## 2021-04-16 MED ORDER — OXYCODONE HCL 15 MG PO TABS: 15.0000 mg | ORAL_TABLET | Freq: Four times a day (QID) | ORAL | 0 refills | Status: DC | PRN

## 2021-04-16 NOTE — Progress Notes (Signed)
[facility] General Follow Up Note  04/16/2021 Name: Hannah Hawkins MRN: [id number] DOB: September 28, 1986 Hannah Hawkins is a 35 y.o. year old female who sees Vevelyn Francois, NP for primary care. LCSW was initially consulted to assess the patient's needs and assist patient with community resources.  Interpreter: No.   Interpreter Name & Language: none  Assessment: Patient experiencing housing barriers and does not have income. She will need to move out of her current living situation in a month or two.   Ongoing Intervention: Today CSW met with patient during PCP visit. Patient reported she will need to move out of her current apartment where she lives with her partner, due to them breaking up. She currently does not have income. CSW referred patient to [facility] for assistance applying for disability income.   Referred patient to resources to search for affordable housing, such as [employer]. Also advised patient of open Museum/gallery exhibitions officer in [place] and [place]; [facility] wait lists remain closed. Also referred patient to social worker over at [employer] and [place]) for additional housing resources.   Review of patient status, including review of consultants reports, relevant laboratory and other test results, and collaboration with appropriate care team members and the patient's provider was performed as part of comprehensive patient evaluation and provision of services.     Follow up Plan: 1. CSW to send referral to [facility] and call in referral to [facility]. Patient is already connected to [facility] but has not spoken to her caseworker in a while 2. CSW available from clinic as needed.  Estanislado Emms, [facility] [phone]

## 2021-04-16 NOTE — Patient Instructions (Signed)
Buprenorphine buccal film What is this medicine? BUPRENORPHINE (byoo pre NOR feen) is a pain reliever. It is used to treat moderate to severe pain. This medicine may be used for other purposes; ask your health care provider or pharmacist if you have questions. COMMON BRAND NAME(S): Belbuca What should I tell my health care provider before I take this medicine? They need to know if you have any of these conditions:  blockage in your bowel  brain tumor  drink more than 3 alcohol-containing drinks per day  drug abuse or addiction  head injury  kidney disease  liver disease  lung or breathing disease, like asthma  mouth sores  thyroid disease  trouble passing urine or change in the amount of urine  an unusual or allergic reaction to buprenorphine, other medicines, foods, dyes, or preservatives  pregnant or trying to get pregnant  breast-feeding How should I use this medicine? Take this medicine by mouth. Follow the directions on the prescription label. Wet the inside of your cheek with tongue or rinse mouth with water before using this medicine. Open package with dry hands just before you are ready to use. Do not cut or tear the film. Use the tip of a dry finger to put film in the mouth with the yellow side of the film facing the cheek. Hold the film in place for 5 seconds. After you place the medicine on your cheek leave the film in place until it dissolves away in about 30 minutes. Do not move or touch the film with fingers or tongue. Do not eat or drink until the film has dissolved. Do not chew or swallow the film. Take your medicine at regular intervals. Do not take your medicine more often than directed. Do not stop taking except on your doctor's advice. A special MedGuide will be given to you by the pharmacist with each prescription and refill. Be sure to read this information carefully each time. Talk to your pediatrician regarding the use of this medicine in children.  Special care may be needed. Overdosage: If you think you have taken too much of this medicine contact a poison control center or emergency room at once. NOTE: This medicine is only for you. Do not share this medicine with others. What if I miss a dose? If you miss a dose, take it as soon as you can. If it is almost time for your next dose, take only that dose. Do not take double or extra doses. What may interact with this medicine? Do not take this medication with any of the following medicines:  cisapride  certain medicines for fungal infections like ketoconazole and itraconazole  dronedarone  pimozide  thioridazine This medicine may interact with the following medications:  alcohol  antihistamines for allergy, cough and cold  antiviral medicines for HIV or AIDS  atropine  certain antibiotics like clarithromycin, erythromycin, linezolid, rifampin  certain medicines for anxiety or sleep  certain medicines for bladder problems like oxybutynin, tolterodine  certain medicines for depression like amitriptyline, fluoxetine, sertraline  certain medicines for migraine headache like almotriptan, eletriptan, frovatriptan, naratriptan, rizatriptan, sumatriptan, zolmitriptan  certain medicines for nausea or vomiting like dolasetron, ondansetron, palonosetron  certain medicines for Parkinson's disease like benztropine, trihexyphenidyl  certain medicines for seizures like phenobarbital, primidone  certain medicines for stomach problems like cimetidine, dicyclomine, hyoscyamine  certain medicines for travel sickness like scopolamine  diuretics  dofetilide  general anesthetics like halothane, isoflurane, methoxyflurane, propofol  ipratropium  local anesthetics like lidocaine, pramoxine, tetracaine  MAOIs like Carbex, Eldepryl, Marplan, Nardil, and Parnate  medicines that relax muscles for surgery  methylene blue  other medicines that prolong the QT interval (cause an  abnormal heart rhythm)  other narcotic medicines for pain or cough  phenothiazines like chlorpromazine, mesoridazine, prochlorperazine  ritonavir  ziprasidone This list may not describe all possible interactions. Give your health care provider a list of all the medicines, herbs, non-prescription drugs, or dietary supplements you use. Also tell them if you smoke, drink alcohol, or use illegal drugs. Some items may interact with your medicine. What should I watch for while using this medicine? Tell your health care provider if your pain does not go away, if it gets worse, or if you have new or a different type of pain. You may develop tolerance to this drug. Tolerance means that you will need a higher dose of the drug for pain relief. Tolerance is normal and is expected if you take this drug for a long time. Do not suddenly stop taking your drug because you may develop a severe reaction. Your body becomes used to the drug. This does NOT mean you are addicted. Addiction is a behavior related to getting and using a drug for a nonmedical reason. If you have pain, you have a medical reason to take pain drug. Your health care provider will tell you how much drug to take. If your health care provider wants you to stop the drug, the dose will be slowly lowered over time to avoid any side effects. If you take other drugs that also cause drowsiness like other narcotic pain drugs, benzodiazepines, or other drugs for sleep, you may have more side effects. Give your health care provider a list of all drugs you use. He or she will tell you how much drug to take. Do not take more drug than directed. Get emergency help right away if you have trouble breathing or are unusually tired or sleepy. Talk to your health care provider about naloxone and how to get it. Naloxone is an emergency drug used for an opioid overdose. An overdose can happen if you take too much opioid. It can also happen if an opioid is taken with some  other drugs or substances, like alcohol. Know the symptoms of an overdose, like trouble breathing, unusually tired or sleepy, or not being able to respond or wake up. Make sure to tell caregivers and close contacts where it is stored. Make sure they know how to use it. After naloxone is given, you must get emergency help right away. Naloxone is a temporary treatment. Repeat doses may be needed. You may get drowsy or dizzy. Do not drive, use machinery, or do anything that needs mental alertness until you know how this drug affects you. Do not stand up or sit up quickly, especially if you are an older patient. This reduces the risk of dizzy or fainting spells. Alcohol may interfere with the effect of this drug. Avoid alcoholic drinks. This drug will cause constipation. If you do not have a bowel movement for 3 days, call your health care provider. Your mouth may get dry. Chewing sugarless gum or sucking hard candy and drinking plenty of water may help. Contact your health care provider if the problem does not go away or is severe. What side effects may I notice from receiving this medicine? Side effects that you should report to your doctor or health care professional as soon as possible:  allergic reactions like skin rash, itching or hives,  swelling of the face, lips, or tongue  breathing problems  confusion  signs and symptoms of a dangerous change in heartbeat or heart rhythm like chest pain; dizziness; fast or irregular heartbeat; palpitations; feeling faint or lightheaded, falls; breathing problems  signs and symptoms of liver injury like dark yellow or brown urine; general ill feeling or flu-like symptoms; light-colored stools; loss of appetite; nausea; right upper belly pain; unusually weak or tired; yellow of the eyes or skin  signs and symptoms of low blood pressure like dizziness; feeling faint or lightheaded, falls; unusually weak or tired  trouble passing urine or change in the amount of  urine Side effects that usually do not require medical attention (report to your doctor or health care professional if they continue or are bothersome):  constipation  dry mouth  nausea, vomiting  tiredness This list may not describe all possible side effects. Call your doctor for medical advice about side effects. You may report side effects to FDA at 1-800-FDA-1088. Where should I keep my medicine? Keep out of the reach of children. This medicine can be abused. Keep your medicine in a safe place to protect it from theft. Do not share this medicine with anyone. Selling or giving away this medicine is dangerous and against the law. Follow the directions in the MedGuide. Store at room temperature between 15 and 30 degrees C (59 and 86 degrees F). This medicine may cause accidental overdose and death if it is taken by other adults, children, or pets. Return medicine that has not been used to an official disposal site. Contact the DEA at 904-801-4316 or your city/county government to find a site. If you cannot return the medicine, remove any unused films from the foil packs and flush any down the toilet to reduce the chance of harm. Throw away the empty foil packaging in the trash. Do not use the medicine after the expiration date. NOTE: This sheet is a summary. It may not cover all possible information. If you have questions about this medicine, talk to your doctor, pharmacist, or health care provider.  2021 Elsevier/Gold Standard (2019-07-17 11:41:05)

## 2021-04-16 NOTE — Progress Notes (Signed)
[place], [place]  [address] Phone:  [phone]   Fax:  [phone]   Established Patient Office Visit  Subjective:  Patient ID: Hannah Hawkins, female    DOB: October 20, 1986  Age: 35 y.o. MRN: [id number]  CC:  Chief Complaint  Patient presents with  . Follow-up    Sickle cell follow up , pain in  rt leg level is 6     HPI Laverta Harnisch presents for follow up. She  has a past medical history of Sickle cell anemia ([place]).   She is having 6/10 right leg pain. Denies fever, headache, cough, wheezing, shortness of breath, chest pains, abdominal pain, back pain, hip pain,   Vaginitis Patient presents for evaluation of an abnormal vaginal discharge. Symptoms have been present for 1 monththese symptoms are reoccurring.  Vaginal symptoms: blisters, lesions, local irritation, pain and redness . Contraception: none. She denies abnormal bleeding, bumps and post coital bleeding. Sexually transmitted infection risk: very low risk of STD exposure. Menstrual flow:irregular this month. She does not feel like she is not wearing anything tight.   Past Medical History:  Diagnosis Date  . Sickle cell anemia (HCC)     Past Surgical History:  Procedure Laterality Date  . EYE SURGERY  03/09/2017   left eye  . EYE SURGERY Right 09/2019   right hemerage     Family History  Problem Relation Age of Onset  . Diabetes Father     Social History   Socioeconomic History  . Marital status: Single    Spouse name: Not on file  . Number of children: Not on file  . Years of education: Not on file  . Highest education level: Not on file  Occupational History  . Not on file  Tobacco Use  . Smoking status: Former Smoker    Types: Cigarettes  . Smokeless tobacco: Never Used  Vaping Use  . Vaping Use: Never used  Substance and Sexual Activity  . Alcohol use: Not Currently  . Drug use: No  . Sexual activity: Yes  Other Topics Concern  . Not on  file  Social History Narrative  . Not on file   Social Determinants of Health   Financial Resource Strain: Not on file  Food Insecurity: Not on file  Transportation Needs: Not on file  Physical Activity: Not on file  Stress: Not on file  Social Connections: Not on file  Intimate Partner Violence: Not on file    Outpatient Medications Prior to Visit  Medication Sig Dispense Refill  . ibuprofen (ADVIL) 800 MG tablet Take 1 tablet (800 mg total) by mouth 3 (three) times daily. 90 tablet 11  . levocetirizine (XYZAL) 5 MG tablet Take 5 mg by mouth every evening.    . montelukast (SINGULAIR) 10 MG tablet Take 1 tablet (10 mg total) by mouth at bedtime. 30 tablet 3  . morphine (MS CONTIN) 30 MG 12 hr tablet Take 1 tablet (30 mg total) by mouth 2 (two) times daily as needed for pain. 60 tablet 0  . NARCAN 4 MG/0.1ML LIQD nasal spray kit Place 4 mg into the nose once as needed (overdose).  0  . promethazine (PHENERGAN) 12.5 MG tablet Take 1 tablet (12.5 mg total) by mouth every 4 (four) hours as needed for nausea or vomiting. 30 tablet 0  . citalopram (CELEXA) 20 MG tablet Take 1 tablet (20 mg total) by mouth daily. 30 tablet 11  . ergocalciferol (VITAMIN  D2) 1.25 MG (50000 UT) capsule Take 1 capsule (50,000 Units total) by mouth once a week. 12 capsule 3  . oxyCODONE (ROXICODONE) 15 MG immediate release tablet Take 1 tablet (15 mg total) by mouth every 6 (six) hours as needed for up to 15 days for pain (moderate to severe pain). 60 tablet 0  . tiZANidine (ZANAFLEX) 2 MG tablet TAKE 1 TABLET(2 MG) BY MOUTH EVERY 8 HOURS AS NEEDED FOR MUSCLE SPASMS 45 tablet 2  . lidocaine (LIDODERM) 5 % Place 1 patch onto the skin daily. Remove & Discard patch within 12 hours or as directed by MD (Patient not taking: Reported on 04/16/2021) 30 patch 2   No facility-administered medications prior to visit.    Allergies  Allergen Reactions  . Penicillins Swelling and Rash    Has patient had a PCN reaction  causing immediate rash, facial/tongue/throat swelling, SOB or lightheadedness with hypotension: Yes Has patient had a PCN reaction causing severe rash involving mucus membranes or skin necrosis: Yes Has patient had a PCN reaction that required hospitalization No Has patient had a PCN reaction occurring within the last 10 years: no If all of the above answers are "NO", then may proceed with Cephalosporin use.     ROS Review of Systems    Objective:    Physical Exam Exam conducted with a chaperone present.  Constitutional:      General: She is not in acute distress.    Appearance: She is not ill-appearing, toxic-appearing or diaphoretic.  HENT:     Nose: Congestion present.     Mouth/Throat:     Mouth: Mucous membranes are moist.  Cardiovascular:     Rate and Rhythm: Normal rate and regular rhythm.     Pulses: Normal pulses.     Heart sounds: Normal heart sounds.  Pulmonary:     Effort: Pulmonary effort is normal.     Breath sounds: Normal breath sounds.  Genitourinary:    Labia:        Right: Lesion present.   Musculoskeletal:     Cervical back: Normal range of motion.  Skin:    General: Skin is warm and dry.     Capillary Refill: Capillary refill takes less than 2 seconds.  Neurological:     General: No focal deficit present.     Mental Status: She is alert and oriented to person, place, and time.  Psychiatric:        Mood and Affect: Mood normal.        Behavior: Behavior normal.        Thought Content: Thought content normal.        Judgment: Judgment normal.     BP 117/66 (BP Location: Right Arm, Patient Position: Sitting, Cuff Size: Normal)   Pulse 80   Temp (!) 97.5 F (36.4 C) (Temporal)   Wt 139 lb (63 kg)   LMP 04/03/2021   SpO2 99%   BMI 24.62 kg/m  Wt Readings from Last 3 Encounters:  04/16/21 139 lb (63 kg)  03/17/21 148 lb (67.1 kg)  02/24/21 148 lb (67.1 kg)     Health Maintenance Due  Topic Date Due  . COVID-19 Vaccine (1) Never done     There are no preventive care reminders to display for this patient.  Lab Results  Component Value Date   TSH 1.940 07/24/2020   Lab Results  Component Value Date   WBC 10.1 03/17/2021   HGB 11.8 (L) 03/17/2021   HCT 32.0 (L)  03/17/2021   MCV 73.1 (L) 03/17/2021   PLT 545 (H) 03/17/2021   Lab Results  Component Value Date   NA 142 03/17/2021   K 3.5 03/17/2021   CO2 26 03/17/2021   GLUCOSE 99 03/17/2021   BUN <5 (L) 03/17/2021   CREATININE 0.70 03/17/2021   BILITOT 0.6 03/17/2021   ALKPHOS 48 03/17/2021   AST 21 03/17/2021   ALT 11 03/17/2021   PROT 8.1 03/17/2021   ALBUMIN 4.5 03/17/2021   CALCIUM 9.6 03/17/2021   ANIONGAP 9 03/17/2021   Lab Results  Component Value Date   CHOL 113 08/07/2020   Lab Results  Component Value Date   HDL 38 (L) 08/07/2020   Lab Results  Component Value Date   LDLCALC 65 08/07/2020   Lab Results  Component Value Date   TRIG 52 08/07/2020   Lab Results  Component Value Date   CHOLHDL 3.0 08/07/2020   No results found for: HGBA1C    Assessment & Plan:   Problem List Items Addressed This Visit      Other   Chronic pain syndrome   Relevant Medications   oxyCODONE (ROXICODONE) 15 MG immediate release tablet   tiZANidine (ZANAFLEX) 2 MG tablet   citalopram (CELEXA) 20 MG tablet   Sickle-cell/Hb-C disease (HCC) - Primary (Chronic) Ensure adequate hydration. Move frequently to reduce venous thromboembolism risk. Avoid situations that could lead to dehydration or could exacerbate pain Discussed S&S of infection, seizures, stroke acute chest, DVT and how important it is to seek medical attention Take medication as directed along with pain contract and overall compliance Discussed the risk related to opiate use (addition, tolerance and dependency)     Relevant Medications   oxyCODONE (ROXICODONE) 15 MG immediate release tablet   Other Relevant Orders   POCT Urinalysis Dipstick (Completed)    Other Visit Diagnoses     Vaginal irritation       Relevant Orders   NuSwab Vaginitis Plus (VG+)   Vaginal lesion       Relevant Orders   WOUND CULTURE   Chronic prescription opiate use       Nausea     Persistent refill on ondansetron       Meds ordered this encounter  Medications  . oxyCODONE (ROXICODONE) 15 MG immediate release tablet    Sig: Take 1 tablet (15 mg total) by mouth every 6 (six) hours as needed for up to 15 days for pain (moderate to severe pain).    Dispense:  60 tablet    Refill:  0    Order Specific Question:   Supervising Provider    Answer:   Tresa Garter [id number]  . lidocaine (LIDODERM) 5 %    Sig: Place 1 patch onto the skin daily. Remove & Discard patch within 12 hours or as directed by MD    Dispense:  30 patch    Refill:  2    Order Specific Question:   Supervising Provider    Answer:   Tresa Garter [id number]  . tiZANidine (ZANAFLEX) 2 MG tablet    Sig: Take 1 tablet (2 mg total) by mouth every 6 (six) hours as needed for muscle spasms.    Dispense:  45 tablet    Refill:  2    Order Specific Question:   Supervising Provider    Answer:   Tresa Garter [id number]  . citalopram (CELEXA) 20 MG tablet    Sig: Take 1 tablet (20 mg total) by mouth  daily.    Dispense:  30 tablet    Refill:  11    Order Specific Question:   Supervising Provider    Answer:   Tresa Garter [id number]  . ondansetron (ZOFRAN-ODT) 8 MG disintegrating tablet    Sig: Take 1 tablet (8 mg total) by mouth 2 (two) times daily for 15 days.    Dispense:  30 tablet    Refill:  5    Do not add to the electronic "Automatic Refill" notification system. Patient may have prescription filled one day early if pharmacy is closed on scheduled refill date.    Order Specific Question:   Supervising Provider    Answer:   Tresa Garter [[id number]]    Follow-up: Return in about 2 months (around 06/16/2021).    Vevelyn Francois, NP

## 2021-04-17 ENCOUNTER — Other Ambulatory Visit: Payer: Self-pay

## 2021-04-17 NOTE — Telephone Encounter (Signed)
Received PA for lidocaine 5% patches For 1 year per [facility] [phone], PA [id number]  , ref #[id number].

## 2021-04-20 LAB — NUSWAB VAGINITIS PLUS (VG+)
BVAB 2: HIGH Score — AB
Candida albicans, NAA: NEGATIVE
Candida glabrata, NAA: NEGATIVE
Chlamydia trachomatis, NAA: NEGATIVE
Megasphaera 1: HIGH Score — AB
Neisseria gonorrhoeae, NAA: NEGATIVE
Trich vag by NAA: NEGATIVE

## 2021-04-22 ENCOUNTER — Telehealth: Payer: Self-pay

## 2021-04-22 NOTE — Telephone Encounter (Signed)
Forwarding

## 2021-04-22 NOTE — Telephone Encounter (Signed)
Please call pt with her test results

## 2021-04-22 NOTE — Telephone Encounter (Signed)
MyChart message sent  Final results are not in.

## 2021-04-23 ENCOUNTER — Other Ambulatory Visit: Payer: Self-pay | Admitting: Nurse Practitioner

## 2021-04-23 DIAGNOSIS — B950 Streptococcus, group A, as the cause of diseases classified elsewhere: Secondary | ICD-10-CM

## 2021-04-23 LAB — WOUND CULTURE

## 2021-04-23 MED ORDER — SULFAMETHOXAZOLE-TRIMETHOPRIM 800-160 MG PO TABS: 1.0000 | ORAL_TABLET | Freq: Two times a day (BID) | ORAL | 0 refills | Status: AC

## 2021-04-23 NOTE — Telephone Encounter (Signed)
Called and lvm for pt call us back. 

## 2021-04-23 NOTE — Telephone Encounter (Signed)
Called and lvm for patient call us back. 

## 2021-04-24 NOTE — Telephone Encounter (Signed)
Called an spoke , informed labs and  antibiotic

## 2021-04-27 ENCOUNTER — Other Ambulatory Visit: Payer: Self-pay

## 2021-04-27 DIAGNOSIS — G894 Chronic pain syndrome: Secondary | ICD-10-CM

## 2021-04-27 DIAGNOSIS — D5720 Sickle-cell/Hb-C disease without crisis: Secondary | ICD-10-CM

## 2021-04-30 ENCOUNTER — Other Ambulatory Visit: Payer: Self-pay | Admitting: Nurse Practitioner

## 2021-04-30 DIAGNOSIS — G894 Chronic pain syndrome: Secondary | ICD-10-CM

## 2021-04-30 DIAGNOSIS — D5720 Sickle-cell/Hb-C disease without crisis: Secondary | ICD-10-CM

## 2021-04-30 DIAGNOSIS — Z79891 Long term (current) use of opiate analgesic: Secondary | ICD-10-CM

## 2021-04-30 MED ORDER — OXYCODONE HCL 15 MG PO TABS: 15.0000 mg | ORAL_TABLET | Freq: Four times a day (QID) | ORAL | 0 refills | Status: DC | PRN

## 2021-04-30 MED ORDER — MORPHINE SULFATE ER 30 MG PO TBCR: 30.0000 mg | EXTENDED_RELEASE_TABLET | Freq: Two times a day (BID) | ORAL | 0 refills | Status: AC

## 2021-05-04 ENCOUNTER — Telehealth: Payer: Self-pay | Admitting: Nurse Practitioner

## 2021-05-04 NOTE — Telephone Encounter (Signed)
Prior Authorization was approved for Morphine Sulfate ER. Confirmation Number: [id number]

## 2021-05-12 ENCOUNTER — Telehealth: Payer: Self-pay | Admitting: Clinical

## 2021-05-12 NOTE — Telephone Encounter (Signed)
[facility] General Follow Up Note  05/12/2021 Name: Hannah Hawkins MRN: [id number] DOB: 1986/10/28 Hannah Hawkins is a 35 y.o. year old female who sees Barbette Merino, NP for primary care. LCSW was initially consulted to assess the patient's needs and assist patient with community resources.  Interpreter: No.   Interpreter Name & Language: none  Assessment: Patient experiencing housing barriers and does not have income. She will need to move out of her current living situation soon.  Ongoing Intervention: CSW received follow up from [facility] on patient; CSW had made referral for assistance with disability claim after patient's last PCP visit. [facility] reported patient missed her appointment with them. CSW called patient today and advised patient call [facility] to reschedule if she is still interested in filing for disability. Patient indicated she would call. CSW available as needed form clinic.  Review of patient status, including review of consultants reports, relevant laboratory and other test results, and collaboration with appropriate care team members and the patient's provider was performed as part of comprehensive patient evaluation and provision of services.     Follow up Plan: 1. CSW available from clinic as needed.  Abigail Butts, LCSW [facility] [phone]

## 2021-05-13 ENCOUNTER — Other Ambulatory Visit: Payer: Self-pay

## 2021-05-13 DIAGNOSIS — G894 Chronic pain syndrome: Secondary | ICD-10-CM

## 2021-05-13 DIAGNOSIS — D5720 Sickle-cell/Hb-C disease without crisis: Secondary | ICD-10-CM

## 2021-05-14 MED ORDER — OXYCODONE HCL 15 MG PO TABS: 15.0000 mg | ORAL_TABLET | Freq: Four times a day (QID) | ORAL | 0 refills | Status: DC | PRN

## 2021-05-15 ENCOUNTER — Emergency Department (HOSPITAL_COMMUNITY)
Admission: EM | Admit: 2021-05-15 | Discharge: 2021-05-15 | Disposition: A | Discharge status: Home / Self Care | Payer: Medicaid Other | Attending: Emergency Medicine | Admitting: Emergency Medicine

## 2021-05-15 ENCOUNTER — Emergency Department (HOSPITAL_COMMUNITY): Payer: Medicaid Other

## 2021-05-15 ENCOUNTER — Other Ambulatory Visit: Payer: Self-pay

## 2021-05-15 DIAGNOSIS — Z87891 Personal history of nicotine dependence: Secondary | ICD-10-CM | POA: Insufficient documentation

## 2021-05-15 DIAGNOSIS — R1084 Generalized abdominal pain: Secondary | ICD-10-CM | POA: Diagnosis not present

## 2021-05-15 DIAGNOSIS — R1033 Periumbilical pain: Secondary | ICD-10-CM | POA: Diagnosis not present

## 2021-05-15 DIAGNOSIS — D5700 Hb-SS disease with crisis, unspecified: Secondary | ICD-10-CM

## 2021-05-15 LAB — CBC WITH DIFFERENTIAL/PLATELET
Abs Immature Granulocytes: 0.02 10*3/uL (ref 0.00–0.07)
Basophils Absolute: 0.1 10*3/uL (ref 0.0–0.1)
Basophils Relative: 1 %
Eosinophils Absolute: 1 10*3/uL — ABNORMAL HIGH (ref 0.0–0.5)
Eosinophils Relative: 11 %
HCT: 31.6 % — ABNORMAL LOW (ref 36.0–46.0)
Hemoglobin: 11.7 g/dL — ABNORMAL LOW (ref 12.0–15.0)
Immature Granulocytes: 0 %
Lymphocytes Relative: 32 %
Lymphs Abs: 3.1 10*3/uL (ref 0.7–4.0)
MCH: 26.7 pg (ref 26.0–34.0)
MCHC: 37 g/dL — ABNORMAL HIGH (ref 30.0–36.0)
MCV: 72 fL — ABNORMAL LOW (ref 80.0–100.0)
Monocytes Absolute: 0.7 10*3/uL (ref 0.1–1.0)
Monocytes Relative: 7 %
Neutro Abs: 4.7 10*3/uL (ref 1.7–7.7)
Neutrophils Relative %: 49 %
Platelets: 399 10*3/uL (ref 150–400)
RBC: 4.39 MIL/uL (ref 3.87–5.11)
RDW: 14.3 % (ref 11.5–15.5)
WBC: 9.7 10*3/uL (ref 4.0–10.5)
nRBC: 0.8 % — ABNORMAL HIGH (ref 0.0–0.2)

## 2021-05-15 LAB — COMPREHENSIVE METABOLIC PANEL
ALT: 12 U/L (ref 0–44)
AST: 17 U/L (ref 15–41)
Albumin: 4.3 g/dL (ref 3.5–5.0)
Alkaline Phosphatase: 49 U/L (ref 38–126)
Anion gap: 8 (ref 5–15)
BUN: 9 mg/dL (ref 6–20)
CO2: 25 mmol/L (ref 22–32)
Calcium: 9.1 mg/dL (ref 8.9–10.3)
Chloride: 106 mmol/L (ref 98–111)
Creatinine, Ser: 0.74 mg/dL (ref 0.44–1.00)
GFR, Estimated: >60 mL/min (ref >60)
Glucose, Bld: 92 mg/dL (ref 70–99)
Potassium: 3.6 mmol/L (ref 3.5–5.1)
Sodium: 139 mmol/L (ref 135–145)
Total Bilirubin: 0.6 mg/dL (ref 0.3–1.2)
Total Protein: 8.5 g/dL — ABNORMAL HIGH (ref 6.5–8.1)

## 2021-05-15 LAB — RETICULOCYTES
Immature Retic Fract: 27.1 % — ABNORMAL HIGH (ref 2.3–15.9)
RBC.: 4.39 MIL/uL (ref 3.87–5.11)
Retic Count, Absolute: 112.8 10*3/uL (ref 19.0–186.0)
Retic Ct Pct: 2.6 % (ref 0.4–3.1)

## 2021-05-15 LAB — I-STAT BETA HCG BLOOD, ED (MC, WL, AP ONLY): I-stat hCG, quantitative: <5 m[IU]/mL (ref <5)

## 2021-05-15 MED ORDER — HYDROMORPHONE HCL 2 MG/ML IJ SOLN
2.0000 mg | INTRAMUSCULAR | Status: AC
  Administered 2021-05-15: 2 mg via INTRAVENOUS
  Filled 2021-05-15: qty 1

## 2021-05-15 MED ORDER — KETOROLAC TROMETHAMINE 15 MG/ML IJ SOLN
15.0000 mg | INTRAMUSCULAR | Status: AC
  Administered 2021-05-15: 15 mg via INTRAVENOUS
  Filled 2021-05-15: qty 1

## 2021-05-15 MED ORDER — HYDROMORPHONE HCL 2 MG/ML IJ SOLN
2.0000 mg | Freq: Once | INTRAMUSCULAR | Status: AC
Start: 2021-05-15 — End: 2021-05-15
  Administered 2021-05-15: 2 mg via INTRAVENOUS
  Filled 2021-05-15: qty 1

## 2021-05-15 MED ORDER — SODIUM CHLORIDE 0.45 % IV SOLN: INTRAVENOUS | Status: DC

## 2021-05-15 MED ORDER — ONDANSETRON HCL 4 MG/2ML IJ SOLN: 4.0000 mg | INTRAMUSCULAR | Status: DC | PRN

## 2021-05-15 MED ORDER — OXYCODONE HCL 5 MG PO TABS
15.0000 mg | ORAL_TABLET | Freq: Once | ORAL | Status: AC
  Administered 2021-05-15: 15 mg via ORAL
  Filled 2021-05-15: qty 3

## 2021-05-15 MED ORDER — DIPHENHYDRAMINE HCL 50 MG/ML IJ SOLN
25.0000 mg | Freq: Once | INTRAMUSCULAR | Status: AC
Start: 2021-05-15 — End: 2021-05-15
  Administered 2021-05-15: 25 mg via INTRAVENOUS
  Filled 2021-05-15: qty 1

## 2021-05-15 NOTE — ED Provider Notes (Signed)
[facility] DEPT Provider Note   CSN: [id number] Arrival date & time: 05/15/21  1519     History Chief Complaint  Patient presents with  . Sickle Cell Pain Crisis    Hannah Hawkins is a 35 y.o. w PMHx sickle cell anemia, chronic pain syndrome, GERD, presenting for evaluation of sickle cell pain that began yesterday.  Pain is located in her typical location, mostly to her right leg.  She states she has been having some difficulty with her home medications.  States there has been some mixup with her preauthorization for her MS Contin, she has not had that in 2 weeks.  It has been a few days since she had her oxycodone as well.  She was expecting to be able to pick up her refill today at the pharmacy, however the pharmacy told her tomorrow is when she can pick it up.  She states she thought she could hold out her pain until she picked up her prescription today, however when she found out she could not, she presents today for pain management.  She does feel like she is in crisis, symptoms began yesterday.  She also also having some cramping periumbilical abdominal pain which she states is not typical for her sickle cell pain.  She has had similar abdominal pains in the past with crisis though it is rare.  She denies chest pain, fevers, extremity swelling, N/V/D/C, or other associated symptoms. She is currently menstruating.  She has treated her symptoms at home today with tizanidine and ibuprofen.  The history is provided by the patient.       Past Medical History:  Diagnosis Date  . Sickle cell anemia [facility])     Patient Active Problem List   Diagnosis Date Noted  . Epigastric pain   . Lactic acidosis   . RUQ abdominal pain   . Rhabdomyolysis 08/07/2020  . Sickle cell crisis ([place]) 08/06/2020  . Elevated LFTs 08/06/2020  . 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 Leukocytosis 08/06/2020  . SIRS (systemic inflammatory response syndrome) ([place]) 08/06/2020  .  Non-intractable vomiting   . Joint pain in fingers of both hands 06/11/2020  . Hypokalemia 07/17/2019  . GERD (gastroesophageal reflux disease) 05/29/2019  . History of cocaine use 05/18/2019  . Chronic pain syndrome   . Cocaine use   . Sickle cell pain crisis ([place]) 11/14/2017  . Anemia of chronic disease 11/13/2017  . Sinusitis, chronic 11/13/2017  . 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 EYE SURGERY  03/09/2017   left eye  . EYE SURGERY Right 09/2019   right hemerage      OB History    Gravida  2   Para      Term      Preterm      AB      Living  1     SAB      IAB      Ectopic      Multiple      Live Births              Family History  Problem Relation Age of Onset  . Diabetes Father     Social History   Tobacco Use  . Smoking status: Former Smoker    Types: Cigarettes  . Smokeless tobacco: Never Used  Vaping Use  . Vaping Use: Never used  Substance Use Topics  . Alcohol use: Not Currently  .  Drug use: No    Home Medications Prior to Admission medications   Medication Sig Start Date End Date Taking? Authorizing Provider  citalopram (CELEXA) 20 MG tablet Take 1 tablet (20 mg total) by mouth daily. 04/16/21 04/16/22  Vevelyn Francois, NP  ibuprofen (ADVIL) 800 MG tablet Take 1 tablet (800 mg total) by mouth 3 (three) times daily. 09/25/20 09/25/21  Vevelyn Francois, NP  levocetirizine (XYZAL) 5 MG tablet Take 5 mg by mouth every evening.    [provider]  lidocaine (LIDODERM) 5 % Place 1 patch onto the skin daily. Remove & Discard patch within 12 hours or as directed by MD 04/16/21   Vevelyn Francois, NP  montelukast (SINGULAIR) 10 MG tablet Take 1 tablet (10 mg total) by mouth at bedtime. 05/02/20   Vevelyn Francois, NP  morphine (MS CONTIN) 30 MG 12 hr tablet Take 1 tablet (30 mg total) by mouth every 12 (twelve) hours. 05/01/21 05/31/21  Vevelyn Francois, NP  NARCAN 4 MG/0.1ML  LIQD nasal spray kit Place 4 mg into the nose once as needed (overdose). 12/22/17   [provider]  oxyCODONE (ROXICODONE) 15 MG immediate release tablet Take 1 tablet (15 mg total) by mouth every 6 (six) hours as needed for up to 15 days for pain (moderate to severe pain). 05/14/21 05/29/21  Vevelyn Francois, NP  promethazine (PHENERGAN) 12.5 MG tablet Take 1 tablet (12.5 mg total) by mouth every 4 (four) hours as needed for nausea or vomiting. 08/10/20   Azzie Glatter, FNP  tiZANidine (ZANAFLEX) 2 MG tablet Take 1 tablet (2 mg total) by mouth every 6 (six) hours as needed for muscle spasms. 04/16/21   Vevelyn Francois, NP    Allergies    Penicillins  Review of Systems   Review of Systems  All other systems reviewed and are negative.   Physical Exam Updated Vital Signs BP 125/85   Pulse (!) 54   Temp 98.2 F (36.8 C) (Oral)   Resp 17   SpO2 100%   Physical Exam Vitals and nursing note reviewed.  Constitutional:      General: She is not in acute distress.    Appearance: She is well-developed. She is not ill-appearing.  HENT:     Head: Normocephalic and atraumatic.  Eyes:     Conjunctiva/sclera: Conjunctivae normal.  Cardiovascular:     Rate and Rhythm: Normal rate and regular rhythm.  Pulmonary:     Effort: Pulmonary effort is normal. No respiratory distress.     Breath sounds: Normal breath sounds.  Abdominal:     General: Bowel sounds are normal.     Palpations: Abdomen is soft.     Tenderness: There is generalized abdominal tenderness. There is no guarding or rebound.  Musculoskeletal:     Right lower leg: No edema.     Left lower leg: No edema.     Comments: No skin changes to BLE. No calf or popliteal TTP.   Skin:    General: Skin is warm.  Neurological:     Mental Status: She is alert.  Psychiatric:        Behavior: Behavior normal.     ED Results / Procedures / Treatments   Labs (all labs ordered are listed, but only abnormal results are  displayed) Labs Reviewed  CBC WITH DIFFERENTIAL/PLATELET - Abnormal; Notable for the following components:      Result Value   Hemoglobin 11.7 (*)    HCT 31.6 (*)  MCV 72.0 (*)    MCHC 37.0 (*)    nRBC 0.8 (*)    Eosinophils Absolute 1.0 (*)    All other components within normal limits  COMPREHENSIVE METABOLIC PANEL - Abnormal; Notable for the following components:   Total Protein 8.5 (*)    All other components within normal limits  RETICULOCYTES - Abnormal; Notable for the following components:   Immature Retic Fract 27.1 (*)    All other components within normal limits  I-STAT BETA HCG BLOOD, ED ([facility], [facility], AP ONLY)    EKG None  Radiology CT Abdomen Pelvis Wo Contrast  Result Date: 05/15/2021 CLINICAL DATA:  35 year old female with abdominal pain. History of sickle cell. EXAM: CT ABDOMEN AND PELVIS WITHOUT CONTRAST TECHNIQUE: Multidetector CT imaging of the abdomen and pelvis was performed following the standard protocol without IV contrast. COMPARISON:  CT abdomen pelvis dated 08/06/2020. FINDINGS: Evaluation of this exam is limited in the absence of intravenous contrast. Lower chest: The visualized lung bases are clear. No intra-abdominal free air or free fluid. Hepatobiliary: The liver is unremarkable. No intrahepatic biliary dilatation. Small amount of layering sludge or small stones within the gallbladder. No pericholecystic fluid or evidence of acute cholecystitis by CT. Pancreas: Unremarkable. No pancreatic ductal dilatation or surrounding inflammatory changes. 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 Adrenals/Urinary Tract: The adrenal glands unremarkable. There is no hydronephrosis or nephrolithiasis on either side. The visualized ureters and urinary bladder are unremarkable. Stomach/Bowel: There is no bowel obstruction or active inflammation. The appendix is normal. Vascular/Lymphatic: The abdominal aorta and IVC are grossly unremarkable on this noncontrast  CT. No portal venous gas. There is no adenopathy. Reproductive: The uterus is anteverted and grossly unremarkable. No adnexal masses. Other: None Musculoskeletal: No acute or significant osseous findings. IMPRESSION: 1. No acute intra-abdominal or pelvic pathology. No bowel obstruction. Normal appendix. 2. Small amount of layering sludge or small stones within the gallbladder. No evidence of acute cholecystitis by CT. Electronically Signed   By: Anner Crete M.D.   On: 05/15/2021 20:59    Procedures Procedures   Medications Ordered in ED Medications  0.45 % sodium chloride infusion ( Intravenous New Bag/Given 05/15/21 1657)  ondansetron (ZOFRAN) injection 4 mg (has no administration in time range)  oxyCODONE (Oxy IR/ROXICODONE) immediate release tablet 15 mg (has no administration in time range)  ketorolac (TORADOL) 15 MG/ML injection 15 mg (15 mg Intravenous Given 05/15/21 1650)  HYDROmorphone (DILAUDID) injection 2 mg (2 mg Intravenous Given 05/15/21 1650)  HYDROmorphone (DILAUDID) injection 2 mg (2 mg Intravenous Given 05/15/21 1820)  diphenhydrAMINE (BENADRYL) injection 25 mg (25 mg Intravenous Given 05/15/21 1649)  HYDROmorphone (DILAUDID) injection 2 mg (2 mg Intravenous Given 05/15/21 2055)    ED Course  I have reviewed the triage vital signs and the nursing notes.  Pertinent labs & imaging results that were available during my care of the patient were reviewed by me and considered in my medical decision making (see chart for details).  Clinical Course as of 05/15/21 2144  Fri May 15, 2021  1853 Called to room by RN. Patient has removed her cardiac monitoring, BP cuff and pulse ox because she states she wasn't given anything to drink. Explained to patient that she was complaining of significant abd pain and therefore CT scan ordered and will need to remain NPO while awaiting imaging. She states she drank from the sink because nursing told her no to PO. She states her abd pain has  resolved. Other  pain is not 7/10 severity but she isn't confident about managing her pain at home.  Informed patient that she will not receive any additional narcotic pain medications if she continues to remove VS monitoring due to safety issue with with high dose narcotic pain medication. Pt verbalized understanding of this. lV team consulted by RN for difficult stick. [JR]  2017 Patient voices her frustration with npo.  Explained to patient reasoning for n.p.o., pending CT scan. She is next on list for CT imaging now that hcg is negative. Patient verbalized understanding of this. [JR]    Clinical Course User Index [JR] Norris Brumbach, [place] N, PA-C   MDM Rules/Calculators/A&P                          Patient presenting with sickle cell crisis. Patient with typical symptoms and pain. CT AP is negative. Abd pain resolved after pain medication with benign repeat abd exam. Pt without CP  or SOB. Pt is not exhibiting signs or symptoms of acute chest syndrome, organ failure, or DVT. Pt is afebrile, hemodynamically stable. WBC wnl. hgb is 11.7. suspect patient may have been experiencing some sx of withdrawal as well due to lapse in home pain meds. She has rx to pick up in the morning from pharmacy. Patient treated with sickle cell protocol, with improvement in symptoms. Pt tolerating PO at discharge. Pt is safe for discharge with symptomatic management. Strict return precautions discussed.  Discussed results, findings, treatment and follow up. Patient advised of return precautions. Patient verbalized understanding and agreed with plan.  Final Clinical Impression(s) / ED Diagnoses Final diagnoses:  Sickle cell anemia with pain [facility])    Rx / DC Orders ED Discharge Orders    None       Aniko Finnigan, [place] N, PA-C 05/15/21 2144    Blanchie Dessert, MD 05/18/21 [phone]

## 2021-05-15 NOTE — ED Notes (Signed)
Ct tech at pt bedside

## 2021-05-15 NOTE — ED Notes (Signed)
Pt states she wants to eat. When told that she is awaiting an abdominal Ct and wont be able to eat until scan is completed, pt states " I want to leave now". Provider made aware of pt request.

## 2021-05-15 NOTE — ED Triage Notes (Signed)
Per EMS- Patient c/o sickle cell pain in her right leg since yesterday. Patient states she was unable to get her meds from the pharmacy and did not have any pain meds to take.  Paitent was given Fentanyl 100 mcg IV prior to arrival tot he Ed.

## 2021-05-15 NOTE — ED Notes (Signed)
Pt refused to let me draw her blood work because she wants pain medicine.

## 2021-05-15 NOTE — ED Notes (Signed)
Tried to get pt's blood work but blood was coming very slow and then stopped.

## 2021-05-15 NOTE — ED Notes (Signed)
Pt was told at this time she would have to wait for food and water per the providers request. Pt states " I will get up and drink the water from the sink or find a water fountain." pt was told that nurse would speak with provider and ask provider to come in to speak with pt.

## 2021-05-15 NOTE — ED Notes (Signed)
Pt refusing cardiac monitoring. 

## 2021-05-15 NOTE — ED Notes (Signed)
Pt is refusing to wear the cardiac 5 lead.

## 2021-05-15 NOTE — Discharge Instructions (Addendum)
Please follow up with your sickle cell provider regarding your visit today. °Take your pain medications as prescribed, as needed. °Return to the ER for chest pain, shortness of breath, fever, or new or concerning symptoms. ° °

## 2021-05-15 NOTE — ED Notes (Signed)
Patient looking for a ride

## 2021-05-15 NOTE — ED Notes (Signed)
Went in and discussed patient wanting to leave. Patient is agreeable to stay for CT.

## 2021-05-15 NOTE — ED Notes (Signed)
Pt returned from CT °

## 2021-05-20 NOTE — Telephone Encounter (Signed)
Confirmation #:[id number] WBenefit Plan:MCAIDHealth Plan:[id number] Prior Approval [id number] PA Type:PHARMACYRecipient:Hannah A LAMPTEYRecipient JY:[id number] RBilling Provider:Billing Provider YQ:[id number] Provider Name:CRYSTAL Dolan Amen Provider [phone]   Submission Date:05/01/2021  Status:APPROVED  Effective Begin Date:05/01/2021 Effective End Date:05/01/2022  Payer:[place] [employer] BENEFITS# of Attachments:1PA Documents:View Documents Attachments Attachment Type Attachment Control # Transmission Code MED REC  4444  UPLOAD Back to top Line Item 1 Status:APPROVEDDrug Name/Code:MORPH SULDrug Code Type:DRUG NAMERequestedLength of Therapy:365 DAYSTotal Quantity:720.000Strength:ApprovedLength of Therapy:365 DAYSTotal Quantity:720.000

## 2021-05-28 ENCOUNTER — Other Ambulatory Visit: Payer: Self-pay | Admitting: Nurse Practitioner

## 2021-05-28 ENCOUNTER — Other Ambulatory Visit: Payer: Self-pay

## 2021-05-28 DIAGNOSIS — G894 Chronic pain syndrome: Secondary | ICD-10-CM

## 2021-05-28 DIAGNOSIS — D5720 Sickle-cell/Hb-C disease without crisis: Secondary | ICD-10-CM

## 2021-05-28 MED ORDER — TIZANIDINE HCL 2 MG PO TABS: 2.0000 mg | ORAL_TABLET | Freq: Four times a day (QID) | ORAL | 2 refills | Status: DC | PRN

## 2021-05-29 MED ORDER — OXYCODONE HCL 15 MG PO TABS: 15.0000 mg | ORAL_TABLET | Freq: Four times a day (QID) | ORAL | 0 refills | Status: DC | PRN

## 2021-06-02 ENCOUNTER — Emergency Department (HOSPITAL_COMMUNITY): Payer: Medicaid Other

## 2021-06-02 ENCOUNTER — Inpatient Hospital Stay (HOSPITAL_COMMUNITY)
Admission: EM | Admit: 2021-06-02 | Discharge: 2021-06-05 | DRG: 417 | Disposition: A | Discharge status: Home / Self Care | Payer: Medicaid Other | Attending: Internal Medicine | Admitting: Internal Medicine

## 2021-06-02 ENCOUNTER — Encounter (HOSPITAL_COMMUNITY): Payer: Self-pay

## 2021-06-02 DIAGNOSIS — D571 Sickle-cell disease without crisis: Secondary | ICD-10-CM

## 2021-06-02 DIAGNOSIS — F3289 Other specified depressive episodes: Secondary | ICD-10-CM

## 2021-06-02 DIAGNOSIS — K8020 Calculus of gallbladder without cholecystitis without obstruction: Secondary | ICD-10-CM | POA: Diagnosis present

## 2021-06-02 DIAGNOSIS — Z880 Allergy status to penicillin: Secondary | ICD-10-CM | POA: Diagnosis not present

## 2021-06-02 DIAGNOSIS — Z833 Family history of diabetes mellitus: Secondary | ICD-10-CM | POA: Diagnosis not present

## 2021-06-02 DIAGNOSIS — K8012 Calculus of gallbladder with acute and chronic cholecystitis without obstruction: Principal | ICD-10-CM | POA: Diagnosis present

## 2021-06-02 DIAGNOSIS — Z87891 Personal history of nicotine dependence: Secondary | ICD-10-CM | POA: Diagnosis not present

## 2021-06-02 DIAGNOSIS — Z79899 Other long term (current) drug therapy: Secondary | ICD-10-CM

## 2021-06-02 DIAGNOSIS — F1120 Opioid dependence, uncomplicated: Secondary | ICD-10-CM | POA: Diagnosis present

## 2021-06-02 DIAGNOSIS — E876 Hypokalemia: Secondary | ICD-10-CM | POA: Diagnosis present

## 2021-06-02 DIAGNOSIS — J302 Other seasonal allergic rhinitis: Secondary | ICD-10-CM | POA: Diagnosis present

## 2021-06-02 DIAGNOSIS — K8080 Other cholelithiasis without obstruction: Secondary | ICD-10-CM | POA: Diagnosis not present

## 2021-06-02 DIAGNOSIS — G894 Chronic pain syndrome: Secondary | ICD-10-CM | POA: Diagnosis present

## 2021-06-02 DIAGNOSIS — F1490 Cocaine use, unspecified, uncomplicated: Secondary | ICD-10-CM | POA: Diagnosis present

## 2021-06-02 DIAGNOSIS — D638 Anemia in other chronic diseases classified elsewhere: Secondary | ICD-10-CM | POA: Diagnosis present

## 2021-06-02 DIAGNOSIS — F32A Depression, unspecified: Secondary | ICD-10-CM | POA: Diagnosis present

## 2021-06-02 DIAGNOSIS — D5700 Hb-SS disease with crisis, unspecified: Secondary | ICD-10-CM | POA: Diagnosis present

## 2021-06-02 DIAGNOSIS — R1011 Right upper quadrant pain: Secondary | ICD-10-CM

## 2021-06-02 DIAGNOSIS — Z20822 Contact with and (suspected) exposure to covid-19: Secondary | ICD-10-CM | POA: Diagnosis present

## 2021-06-02 LAB — BASIC METABOLIC PANEL
Anion gap: 11 (ref 5–15)
BUN: 7 mg/dL (ref 6–20)
CO2: 26 mmol/L (ref 22–32)
Calcium: 8.8 mg/dL — ABNORMAL LOW (ref 8.9–10.3)
Chloride: 101 mmol/L (ref 98–111)
Creatinine, Ser: 0.59 mg/dL (ref 0.44–1.00)
GFR, Estimated: >60 mL/min (ref >60)
Glucose, Bld: 95 mg/dL (ref 70–99)
Potassium: 3.2 mmol/L — ABNORMAL LOW (ref 3.5–5.1)
Sodium: 138 mmol/L (ref 135–145)

## 2021-06-02 LAB — CBC
HCT: 27.4 % — ABNORMAL LOW (ref 36.0–46.0)
Hemoglobin: 10.2 g/dL — ABNORMAL LOW (ref 12.0–15.0)
MCH: 26.8 pg (ref 26.0–34.0)
MCHC: 37.2 g/dL — ABNORMAL HIGH (ref 30.0–36.0)
MCV: 71.9 fL — ABNORMAL LOW (ref 80.0–100.0)
Platelets: 444 10*3/uL — ABNORMAL HIGH (ref 150–400)
RBC: 3.81 MIL/uL — ABNORMAL LOW (ref 3.87–5.11)
RDW: 15.7 % — ABNORMAL HIGH (ref 11.5–15.5)
WBC: 11.6 10*3/uL — ABNORMAL HIGH (ref 4.0–10.5)
nRBC: 1.6 % — ABNORMAL HIGH (ref 0.0–0.2)

## 2021-06-02 LAB — HEPATIC FUNCTION PANEL
ALT: 12 U/L (ref 0–44)
AST: 19 U/L (ref 15–41)
Albumin: 4 g/dL (ref 3.5–5.0)
Alkaline Phosphatase: 47 U/L (ref 38–126)
Bilirubin, Direct: 0.1 mg/dL (ref 0.0–0.2)
Indirect Bilirubin: 1 mg/dL — ABNORMAL HIGH (ref 0.3–0.9)
Total Bilirubin: 1.1 mg/dL (ref 0.3–1.2)
Total Protein: 7.9 g/dL (ref 6.5–8.1)

## 2021-06-02 LAB — RESP PANEL BY RT-PCR (FLU A&B, COVID) ARPGX2
Influenza A by PCR: NEGATIVE
Influenza B by PCR: NEGATIVE
SARS Coronavirus 2 by RT PCR: NEGATIVE

## 2021-06-02 LAB — RETICULOCYTES
Immature Retic Fract: 30.7 % — ABNORMAL HIGH (ref 2.3–15.9)
RBC.: 3.87 MIL/uL (ref 3.87–5.11)
Retic Count, Absolute: 91.5 10*3/uL (ref 19.0–186.0)
Retic Ct Pct: 2.5 % (ref 0.4–3.1)

## 2021-06-02 LAB — I-STAT BETA HCG BLOOD, ED (MC, WL, AP ONLY): I-stat hCG, quantitative: <5 m[IU]/mL (ref <5)

## 2021-06-02 LAB — TROPONIN I (HIGH SENSITIVITY)
Troponin I (High Sensitivity): 10 ng/L (ref <18)
Troponin I (High Sensitivity): 6 ng/L (ref <18)

## 2021-06-02 LAB — LIPASE, BLOOD: Lipase: 38 U/L (ref 11–51)

## 2021-06-02 MED ORDER — SODIUM CHLORIDE 0.9 % IV SOLN: INTRAVENOUS | Status: DC

## 2021-06-02 MED ORDER — ONDANSETRON HCL 4 MG PO TABS: 4.0000 mg | ORAL_TABLET | Freq: Four times a day (QID) | ORAL | Status: DC | PRN

## 2021-06-02 MED ORDER — ACETAMINOPHEN 650 MG RE SUPP: 650.0000 mg | Freq: Four times a day (QID) | RECTAL | Status: DC | PRN

## 2021-06-02 MED ORDER — SODIUM CHLORIDE 0.45 % IV SOLN: INTRAVENOUS | Status: DC

## 2021-06-02 MED ORDER — NALOXONE HCL 4 MG/0.1ML NA LIQD
4.0000 mg | Freq: Once | NASAL | Status: DC | PRN
  Filled 2021-06-02: qty 8

## 2021-06-02 MED ORDER — ENOXAPARIN SODIUM 40 MG/0.4ML IJ SOSY
40.0000 mg | PREFILLED_SYRINGE | INTRAMUSCULAR | Status: DC
  Administered 2021-06-04 – 2021-06-05 (×2): 40 mg via SUBCUTANEOUS
  Filled 2021-06-02 (×2): qty 0.4

## 2021-06-02 MED ORDER — CIPROFLOXACIN IN D5W 400 MG/200ML IV SOLN
400.0000 mg | Freq: Once | INTRAVENOUS | Status: AC
  Administered 2021-06-02: 400 mg via INTRAVENOUS
  Filled 2021-06-02: qty 200

## 2021-06-02 MED ORDER — HYDROMORPHONE HCL 2 MG/ML IJ SOLN
2.0000 mg | INTRAMUSCULAR | Status: AC
  Filled 2021-06-02: qty 1

## 2021-06-02 MED ORDER — MAGNESIUM HYDROXIDE 400 MG/5ML PO SUSP: 30.0000 mL | Freq: Every day | ORAL | Status: DC | PRN

## 2021-06-02 MED ORDER — ONDANSETRON HCL 4 MG/2ML IJ SOLN: 4.0000 mg | Freq: Four times a day (QID) | INTRAMUSCULAR | Status: DC | PRN

## 2021-06-02 MED ORDER — OXYCODONE HCL 5 MG PO TABS
15.0000 mg | ORAL_TABLET | Freq: Four times a day (QID) | ORAL | Status: DC | PRN
  Administered 2021-06-03 (×2): 15 mg via ORAL
  Filled 2021-06-02 (×2): qty 3

## 2021-06-02 MED ORDER — MONTELUKAST SODIUM 10 MG PO TABS
10.0000 mg | ORAL_TABLET | Freq: Every day | ORAL | Status: DC
  Administered 2021-06-04: 10 mg via ORAL
  Filled 2021-06-02 (×2): qty 1

## 2021-06-02 MED ORDER — DIPHENHYDRAMINE HCL 50 MG/ML IJ SOLN
25.0000 mg | Freq: Once | INTRAMUSCULAR | Status: AC
  Administered 2021-06-02: 25 mg via INTRAVENOUS
  Filled 2021-06-02: qty 1

## 2021-06-02 MED ORDER — ACETAMINOPHEN 325 MG PO TABS: 650.0000 mg | ORAL_TABLET | Freq: Four times a day (QID) | ORAL | Status: DC | PRN

## 2021-06-02 MED ORDER — TRAZODONE HCL 50 MG PO TABS: 25.0000 mg | ORAL_TABLET | Freq: Every evening | ORAL | Status: DC | PRN

## 2021-06-02 MED ORDER — TIZANIDINE HCL 4 MG PO TABS: 2.0000 mg | ORAL_TABLET | Freq: Four times a day (QID) | ORAL | Status: DC | PRN

## 2021-06-02 MED ORDER — CITALOPRAM HYDROBROMIDE 20 MG PO TABS
20.0000 mg | ORAL_TABLET | Freq: Every day | ORAL | Status: DC
  Administered 2021-06-04 – 2021-06-05 (×2): 20 mg via ORAL
  Filled 2021-06-02 (×2): qty 1

## 2021-06-02 MED ORDER — HYDROMORPHONE HCL 2 MG/ML IJ SOLN
2.0000 mg | INTRAMUSCULAR | Status: AC
  Administered 2021-06-02: 2 mg via INTRAVENOUS
  Filled 2021-06-02: qty 1

## 2021-06-02 MED ORDER — LIDOCAINE 5 % EX PTCH
1.0000 | MEDICATED_PATCH | CUTANEOUS | Status: DC
  Administered 2021-06-03 – 2021-06-04 (×2): 1 via TRANSDERMAL
  Filled 2021-06-02 (×5): qty 1

## 2021-06-02 MED ORDER — PROMETHAZINE HCL 25 MG PO TABS
12.5000 mg | ORAL_TABLET | ORAL | Status: DC | PRN
Start: 2021-06-02 — End: 2021-06-05

## 2021-06-02 MED ORDER — ONDANSETRON HCL 4 MG/2ML IJ SOLN
4.0000 mg | INTRAMUSCULAR | Status: DC | PRN
  Administered 2021-06-03: 4 mg via INTRAVENOUS
  Filled 2021-06-02: qty 2

## 2021-06-02 MED ORDER — LEVOCETIRIZINE DIHYDROCHLORIDE 5 MG PO TABS: 5.0000 mg | ORAL_TABLET | Freq: Every evening | ORAL | Status: DC

## 2021-06-02 NOTE — ED Triage Notes (Signed)
Per EMS- Patient Is from home. Patient c/o epigastric pain and nausea x 2 weeks.  Patient states at 1100 today the pain radiated to the right chest.  Right chest pain worse with movement, inspiration, and palpation.

## 2021-06-02 NOTE — ED Provider Notes (Signed)
Emergency Medicine Provider Triage Evaluation Note  Viriginia Amendola , a 35 y.o. female  was evaluated in triage.  Pt complains of right side CP, epigastric pain, nausea x 2 weeks, similar and different than prior SS crisis but unable to say what is different.  Review of Systems  Positive: CP, epigastric pain, nausea Negative: Fever, vomiting  Physical Exam  BP 126/78 (BP Location: Right Arm)   Pulse 77   Temp 98.1 F (36.7 C) (Oral)   Resp 14   LMP 05/15/2021 (Within Days)   SpO2 96%  Gen:   Awake, no distress   Resp:  Normal effort  MSK:   Moves extremities without difficulty  Other:  Chest wall tender, diffuse abdominal tenderness  Medical Decision Making  Medically screening exam initiated at 7:29 PM.  Appropriate orders placed.  Nijae Doyel was informed that the remainder of the evaluation will be completed by another provider, this initial triage assessment does not replace that evaluation, and the importance of remaining in the ED until their evaluation is complete.     Jeannie Fend, PA-C 06/02/21 1930    Lorre Nick, MD 06/05/21 [phone]

## 2021-06-02 NOTE — ED Provider Notes (Addendum)
[facility] DEPT Provider Note   CSN: [id number] Arrival date & time: 06/02/21  1838     History Chief Complaint  Patient presents with   Chest Pain   Sickle Cell Pain Crisis    Hannah Hawkins is a 35 y.o. female.  35 year old female with history of sickle cell disease presents with several weeks of epigastric pain radiating to her chest.  Symptoms are somewhat worse with food.  Has CT scan was performed about 3 weeks ago which did show some small gallstones.  She has had nausea and vomiting but denies any fever.  No dyspnea.  Denies any hemoptysis.  Has been using her home OxyContin but has been out of her home morphine.  Denies any urinary symptoms.      Past Medical History:  Diagnosis Date   Sickle cell anemia ([place])     Patient Active Problem List   Diagnosis Date Noted   Epigastric pain    Lactic acidosis    RUQ abdominal pain    Rhabdomyolysis 08/07/2020   Sickle cell crisis ([place]) 08/06/2020   Elevated LFTs 08/06/2020   Cyst of spleen 08/06/2020   Leukocytosis 08/06/2020   SIRS (systemic inflammatory response syndrome) ([facility]) 08/06/2020   Non-intractable vomiting    Joint pain in fingers of both hands 06/11/2020   Hypokalemia 07/17/2019   GERD (gastroesophageal reflux disease) 05/29/2019   History of cocaine use 05/18/2019   Chronic pain syndrome    Cocaine use    Sickle cell pain crisis ([place]) 11/14/2017   Anemia of chronic disease 11/13/2017   Sinusitis, chronic 11/13/2017   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    Past Surgical History:  Procedure Laterality Date   EYE SURGERY  03/09/2017   left eye   EYE SURGERY Right 09/2019   right hemerage      OB History     Gravida  2   Para      Term      Preterm      AB      Living  1      SAB      IAB      Ectopic      Multiple      Live Births              Family History  Problem Relation Age of Onset   Diabetes Father     Social History    Tobacco Use   Smoking status: Former    Pack years: 0.00    Types: Cigarettes   Smokeless tobacco: Never  Vaping Use   Vaping Use: Never used  Substance Use Topics   Alcohol use: Not Currently   Drug use: No    Home Medications Prior to Admission medications   Medication Sig Start Date End Date Taking? Authorizing Provider  citalopram (CELEXA) 20 MG tablet Take 1 tablet (20 mg total) by mouth daily. 04/16/21 04/16/22  Vevelyn Francois, NP  ibuprofen (ADVIL) 800 MG tablet Take 1 tablet (800 mg total) by mouth 3 (three) times daily. 09/25/20 09/25/21  Vevelyn Francois, NP  levocetirizine (XYZAL) 5 MG tablet Take 5 mg by mouth every evening.    [provider]  lidocaine (LIDODERM) 5 % Place 1 patch onto the skin daily. Remove & Discard patch within 12 hours or as directed by MD 04/16/21   Vevelyn Francois, NP  montelukast (SINGULAIR) 10 MG tablet Take 1 tablet (10 mg total) by mouth at bedtime. 05/02/20  Dionisio David M, NP  NARCAN 4 MG/0.1ML LIQD nasal spray kit Place 4 mg into the nose once as needed (overdose). 12/22/17   [provider]  oxyCODONE (ROXICODONE) 15 MG immediate release tablet Take 1 tablet (15 mg total) by mouth every 6 (six) hours as needed for up to 15 days for pain (moderate to severe pain). 05/31/21 06/15/21  Vevelyn Francois, NP  promethazine (PHENERGAN) 12.5 MG tablet Take 1 tablet (12.5 mg total) by mouth every 4 (four) hours as needed for nausea or vomiting. 08/10/20   Azzie Glatter, FNP  tiZANidine (ZANAFLEX) 2 MG tablet Take 1 tablet (2 mg total) by mouth every 6 (six) hours as needed for muscle spasms. 05/28/21   Vevelyn Francois, NP    Allergies    Penicillins  Review of Systems   Review of Systems  All other systems reviewed and are negative.  Physical Exam Updated Vital Signs BP 126/78 (BP Location: Right Arm)   Pulse 77   Temp 98.1 F (36.7 C) (Oral)   Resp 14   LMP 05/15/2021 (Within Days)   SpO2 96%   Physical Exam Vitals and  nursing note reviewed.  Constitutional:      General: She is not in acute distress.    Appearance: Normal appearance. She is well-developed. She is not toxic-appearing.  HENT:     Head: Normocephalic and atraumatic.  Eyes:     General: Lids are normal.     Conjunctiva/sclera: Conjunctivae normal.     Pupils: Pupils are equal, round, and reactive to light.  Neck:     Thyroid: No thyroid mass.     Trachea: No tracheal deviation.  Cardiovascular:     Rate and Rhythm: Normal rate and regular rhythm.     Heart sounds: Normal heart sounds. No murmur heard.   No gallop.  Pulmonary:     Effort: Pulmonary effort is normal. No respiratory distress.     Breath sounds: Normal breath sounds. No stridor. No decreased breath sounds, wheezing, rhonchi or rales.  Abdominal:     General: There is no distension.     Palpations: Abdomen is soft.     Tenderness: There is abdominal tenderness in the right upper quadrant and epigastric area. There is no rebound.    Musculoskeletal:        General: No tenderness. Normal range of motion.     Cervical back: Normal range of motion and neck supple.  Skin:    General: Skin is warm and dry.     Findings: No abrasion or rash.  Neurological:     Mental Status: She is alert and oriented to person, place, and time. Mental status is at baseline.     GCS: GCS eye subscore is 4. GCS verbal subscore is 5. GCS motor subscore is 6.     Cranial Nerves: Cranial nerves are intact. No cranial nerve deficit.     Sensory: No sensory deficit.     Motor: Motor function is intact.  Psychiatric:        Attention and Perception: Attention normal.        Speech: Speech normal.        Behavior: Behavior normal.    ED Results / Procedures / Treatments   Labs (all labs ordered are listed, but only abnormal results are displayed) Labs Reviewed  RESP PANEL BY RT-PCR (FLU A&B, COVID) ARPGX2  BASIC METABOLIC PANEL  CBC  RETICULOCYTES  HEPATIC FUNCTION PANEL  LIPASE, BLOOD  I-STAT BETA HCG BLOOD, ED ([facility], [facility], [facility] ONLY)  TROPONIN I (HIGH SENSITIVITY)    EKG EKG Interpretation  Date/Time:  Tuesday June 02 2021 19:22:48 EDT Ventricular Rate:  74 PR Interval:  140 QRS Duration: 78 QT Interval:  424 QTC Calculation: 471 R Axis:   75 Text Interpretation: Sinus rhythm Left ventricular hypertrophy ST elev, probable normal early repol pattern 12 Lead; [place] Confirmed by Lacretia Leigh ([id number]) on 06/02/2021 8:14:52 PM  Radiology DG Chest 2 View  Result Date: 06/02/2021 CLINICAL DATA:  Chest pain EXAM: CHEST - 2 VIEW COMPARISON:  08/06/2020 FINDINGS: The heart size and mediastinal contours are within normal limits. Both lungs are clear. The visualized skeletal structures are unremarkable. IMPRESSION: No active cardiopulmonary disease. Electronically Signed   By: Donavan Foil M.D.   On: 06/02/2021 19:58    Procedures Procedures   Medications Ordered in ED Medications  0.45 % sodium chloride infusion (has no administration in time range)  HYDROmorphone (DILAUDID) injection 2 mg (has no administration in time range)  HYDROmorphone (DILAUDID) injection 2 mg (has no administration in time range)  diphenhydrAMINE (BENADRYL) injection 25 mg (has no administration in time range)  ondansetron (ZOFRAN) injection 4 mg (has no administration in time range)    ED Course  I have reviewed the triage vital signs and the nursing notes.  Pertinent labs & imaging results that were available during my care of the patient were reviewed by me and considered in my medical decision making (see chart for details).    MDM Rules/Calculators/A&P                         Patient medicated for pain and continues to have lower quadrant abdominal discomfort.  Ultrasound of right upper quadrant consistent with cholelithiasis with sludge.  Slight leukocytosis noted on CBC with white count of 11.6 thousand.  Due to patient's ongoing pain will consult general surgery with likely  hospitalist admission.  IV antibiotics ordered  10:01 PM Discussed with Dr. Rayburn Go from general surgery who will see the patient in consultation and request hospitalist admission Final Clinical Impression(s) / ED Diagnoses Final diagnoses:  None    Rx / DC Orders ED Discharge Orders     None        Lacretia Leigh, MD 06/02/21 2158    Lacretia Leigh, MD 06/02/21 2201

## 2021-06-02 NOTE — H&P (Signed)
[facility]   PATIENT NAME: Hannah Hawkins    MR#:  [id number]  DATE OF BIRTH:  1986-09-02  DATE OF ADMISSION:  06/02/2021  PRIMARY CARE PHYSICIAN: Vevelyn Francois, NP   Patient is coming from: Home  REQUESTING/REFERRING PHYSICIAN: Lacretia Leigh, MD   CHIEF COMPLAINT:   Chief Complaint  Patient presents with   Chest Pain   Sickle Cell Pain Crisis    HISTORY OF PRESENT ILLNESS:  Hannah Hawkins is a 35 y.o. African-American female with medical history significant for sickle cell disease with chronic anemia, chronic pain syndrome, history of opiate dependence and tolerance and polysubstance abuse, who presented to emergency room with acute onset of right upper quadrant abdominal pain which has been intermittent over the last couple weeks, feels like a colicky pain and occasional burning with exacerbation when she is not eating.  She admitted to nausea and vomiting once but denied any diarrhea.  No jaundice or fever or chills.  She has occasional cough without wheezing or dyspnea.  No chest pain or palpitations.  No dysuria, oliguria or hematuria or flank pain.    ED Course: When she came to the ER vital signs were within normal.  Labs revealed hypokalemia 3.2 with otherwise unremarkable CMP.  CBC showed leukocytosis of 11.6 and mild thrombocytosis of 444 with associated anemia.  Quantitative hCG was less than 5. Antigens and COVID-19 PCR came back negative. EKG as reviewed by me : Showed normal sinus rhythm with rate of 74 LVH and early repolarization. Imaging: Right upper quadrant ultrasound revealed cholelithiasis and gallbladder sludge with no signs of cholecystitis.  Two-view chest x-ray showed no acute cardiopulmonary disease.  400 mg of IV Cipro and 25 mg IV Benadryl, 2 mg of IV Dilaudid and 4 mg IV Zofran and hydration with half-normal saline at 150 mill per hour.  Dr. Windle Guard with general surgery was notified about patient and is aware.  She will be admitted to a  medical bed for further evaluation and management. PAST MEDICAL HISTORY:   Past Medical History:  Diagnosis Date   Sickle cell anemia ([place])     PAST SURGICAL HISTORY:   Past Surgical History:  Procedure Laterality Date   EYE SURGERY  03/09/2017   left eye   EYE SURGERY Right 09/2019   right hemerage     SOCIAL HISTORY:   Social History   Tobacco Use   Smoking status: Former    Pack years: 0.00    Types: Cigarettes   Smokeless tobacco: Never  Substance Use Topics   Alcohol use: Not Currently    FAMILY HISTORY:   Family History  Problem Relation Age of Onset   Diabetes Father     DRUG ALLERGIES:   Allergies  Allergen Reactions   Penicillins Swelling and Rash    Has patient had a PCN reaction causing immediate rash, facial/tongue/throat swelling, SOB or lightheadedness with hypotension: Yes Has patient had a PCN reaction causing severe rash involving mucus membranes or skin necrosis: Yes Has patient had a PCN reaction that required hospitalization No Has patient had a PCN reaction occurring within the last 10 years: no If all of the above answers are "NO", then may proceed with Cephalosporin use.     REVIEW OF SYSTEMS:   ROS As per history of present illness. All pertinent systems were reviewed above. Constitutional, HEENT, cardiovascular, respiratory, GI, GU, musculoskeletal, neuro, psychiatric, endocrine, integumentary and hematologic systems were reviewed and are otherwise negative/unremarkable except for positive  findings mentioned above in the HPI.   MEDICATIONS AT HOME:   Prior to Admission medications   Medication Sig Start Date End Date Taking? Authorizing Provider  citalopram (CELEXA) 20 MG tablet Take 1 tablet (20 mg total) by mouth daily. 04/16/21 04/16/22  Vevelyn Francois, NP  ibuprofen (ADVIL) 800 MG tablet Take 1 tablet (800 mg total) by mouth 3 (three) times daily. 09/25/20 09/25/21  Vevelyn Francois, NP  levocetirizine (XYZAL) 5 MG tablet Take 5  mg by mouth every evening.    [provider]  lidocaine (LIDODERM) 5 % Place 1 patch onto the skin daily. Remove & Discard patch within 12 hours or as directed by MD 04/16/21   Vevelyn Francois, NP  montelukast (SINGULAIR) 10 MG tablet Take 1 tablet (10 mg total) by mouth at bedtime. 05/02/20   Vevelyn Francois, NP  NARCAN 4 MG/0.1ML LIQD nasal spray kit Place 4 mg into the nose once as needed (overdose). 12/22/17   [provider]  oxyCODONE (ROXICODONE) 15 MG immediate release tablet Take 1 tablet (15 mg total) by mouth every 6 (six) hours as needed for up to 15 days for pain (moderate to severe pain). 05/31/21 06/15/21  Vevelyn Francois, NP  promethazine (PHENERGAN) 12.5 MG tablet Take 1 tablet (12.5 mg total) by mouth every 4 (four) hours as needed for nausea or vomiting. 08/10/20   Azzie Glatter, FNP  tiZANidine (ZANAFLEX) 2 MG tablet Take 1 tablet (2 mg total) by mouth every 6 (six) hours as needed for muscle spasms. 05/28/21   Vevelyn Francois, NP      VITAL SIGNS:  Blood pressure 124/81, pulse 81, temperature 98.1 F (36.7 C), temperature source Oral, resp. rate 14, last menstrual period 05/15/2021, SpO2 97 %.  PHYSICAL EXAMINATION:  Physical Exam  GENERAL:  35 y.o.-year-old African-American female patient lying in the bed with no acute distress.  EYES: Pupils equal, round, reactive to light and accommodation. No scleral icterus. Extraocular muscles intact.  HEENT: Head atraumatic, normocephalic. Oropharynx and nasopharynx clear.  NECK:  Supple, no jugular venous distention. No thyroid enlargement, no tenderness.  LUNGS: Normal breath sounds bilaterally, no wheezing, rales,rhonchi or crepitation. No use of accessory muscles of respiration.  CARDIOVASCULAR: Regular rate and rhythm, S1, S2 normal. No murmurs, rubs, or gallops.  ABDOMEN: Soft, nondistended, nontender. Bowel sounds present. No organomegaly or mass.  EXTREMITIES: No pedal edema, cyanosis, or clubbing.   NEUROLOGIC: Cranial nerves II through XII are intact. Muscle strength 5/5 in all extremities. Sensation intact. Gait not checked.  PSYCHIATRIC: The patient is alert and oriented x 3.  Normal affect and good eye contact. SKIN: No obvious rash, lesion, or ulcer.   LABORATORY PANEL:   CBC Recent Labs  Lab 06/02/21 1919  WBC 11.6*  HGB 10.2*  HCT 27.4*  PLT 444*   ------------------------------------------------------------------------------------------------------------------  Chemistries  Recent Labs  Lab 06/02/21 1919 06/02/21 1932  NA 138  --   K 3.2*  --   CL 101  --   CO2 26  --   GLUCOSE 95  --   BUN 7  --   CREATININE 0.59  --   CALCIUM 8.8*  --   AST  --  19  ALT  --  12  ALKPHOS  --  47  BILITOT  --  1.1   ------------------------------------------------------------------------------------------------------------------  Cardiac Enzymes No results for input(s): TROPONINI in the last 168 hours. ------------------------------------------------------------------------------------------------------------------  RADIOLOGY:  DG Chest 2 View  Result Date:  06/02/2021 CLINICAL DATA:  Chest pain EXAM: CHEST - 2 VIEW COMPARISON:  08/06/2020 FINDINGS: The heart size and mediastinal contours are within normal limits. Both lungs are clear. The visualized skeletal structures are unremarkable. IMPRESSION: No active cardiopulmonary disease. Electronically Signed   By: Donavan Foil M.D.   On: 06/02/2021 19:58   US Abdomen Limited  Result Date: 06/02/2021 CLINICAL DATA:  Right upper quadrant pain for 2 weeks. EXAM: ULTRASOUND ABDOMEN LIMITED RIGHT UPPER QUADRANT COMPARISON:  CT 05/15/2021 FINDINGS: Gallbladder: Cholelithiasis with multiple stones in the dependent gallbladder. Largest measures about 5 mm. Layering sludge is also present. No gallbladder wall thickening or edema. Murphy's sign is negative. Common bile duct: Diameter: 3 mm, normal Liver: No focal lesion identified.  Within normal limits in parenchymal echogenicity. Portal vein is patent on color Doppler imaging with normal direction of blood flow towards the liver. Other: None. IMPRESSION: Cholelithiasis and gallbladder sludge. No additional changes to suggest cholecystitis. Electronically Signed   By: Lucienne Capers M.D.   On: 06/02/2021 21:17      IMPRESSION AND PLAN:  Active Problems:   Cholelithiasis  1.  Symptomatic cholelithiasis with right upper quadrant abdominal pain. - We will be admitted to a medical bed. - Pain management will be provided. - We will hold off on further IV antibiotic therapy. - General surgery consult will be obtained. - Dr. Romana Juniper was notified about the patient.  2.  Sickle cell disease without crisis. - We will monitor for any worsening pain.  3.  Depression. - We will continue her Celexa.  4.  Seasonal allergies. - We will continue her Xyzal and Singulair.  DVT prophylaxis: Lovenox. Code Status: full code. Family Communication:  The plan of care was discussed in details with the patient (and family). I answered all questions. The patient agreed to proceed with the above mentioned plan. Further management will depend upon hospital course. Disposition Plan: Back to previous home environment Consults called:  General surgery All the records are reviewed and case discussed with ED provider.  Status is: Inpatient  Remains inpatient appropriate because:Ongoing active pain requiring inpatient pain management, Ongoing diagnostic testing needed not appropriate for outpatient work up, Unsafe d/c plan, IV treatments appropriate due to intensity of illness or inability to take PO, and Inpatient level of care appropriate due to severity of illness  Dispo: The patient is from: Home              Anticipated d/c is to: Home              Patient currently is not medically stable to d/c.   Difficult to place patient No    TOTAL TIME TAKING CARE OF THIS PATIENT:  55 minutes.    Christel Mormon M.D on 06/02/2021 at 10:13 PM  [facility]   From 7 PM-7 AM, contact night-coverage [email or web address]  CC: Primary care physician; Vevelyn Francois, NP

## 2021-06-03 ENCOUNTER — Encounter (HOSPITAL_COMMUNITY)
Admission: EM | Disposition: A | Discharge status: Home / Self Care | Payer: Self-pay | Source: Home / Self Care | Attending: Internal Medicine

## 2021-06-03 ENCOUNTER — Inpatient Hospital Stay (HOSPITAL_COMMUNITY): Payer: Medicaid Other | Admitting: Certified Registered Nurse Anesthetist

## 2021-06-03 ENCOUNTER — Encounter (HOSPITAL_COMMUNITY): Payer: Self-pay | Admitting: Family Medicine

## 2021-06-03 ENCOUNTER — Inpatient Hospital Stay: Admit: 2021-06-03 | Payer: Medicaid Other | Admitting: Surgery

## 2021-06-03 ENCOUNTER — Other Ambulatory Visit: Payer: Self-pay

## 2021-06-03 DIAGNOSIS — K8080 Other cholelithiasis without obstruction: Secondary | ICD-10-CM

## 2021-06-03 HISTORY — PX: CHOLECYSTECTOMY: SHX55

## 2021-06-03 LAB — RAPID URINE DRUG SCREEN, HOSP PERFORMED
Amphetamines: NOT DETECTED
Barbiturates: NOT DETECTED
Benzodiazepines: NOT DETECTED
Cocaine: POSITIVE — AB
Opiates: POSITIVE — AB
Tetrahydrocannabinol: NOT DETECTED

## 2021-06-03 LAB — CBC
HCT: 26.3 % — ABNORMAL LOW (ref 36.0–46.0)
Hemoglobin: 9.5 g/dL — ABNORMAL LOW (ref 12.0–15.0)
MCH: 26.2 pg (ref 26.0–34.0)
MCHC: 36.1 g/dL — ABNORMAL HIGH (ref 30.0–36.0)
MCV: 72.7 fL — ABNORMAL LOW (ref 80.0–100.0)
Platelets: 424 10*3/uL — ABNORMAL HIGH (ref 150–400)
RBC: 3.62 MIL/uL — ABNORMAL LOW (ref 3.87–5.11)
RDW: 15.6 % — ABNORMAL HIGH (ref 11.5–15.5)
WBC: 11.5 10*3/uL — ABNORMAL HIGH (ref 4.0–10.5)
nRBC: 1.9 % — ABNORMAL HIGH (ref 0.0–0.2)

## 2021-06-03 LAB — COMPREHENSIVE METABOLIC PANEL
ALT: 11 U/L (ref 0–44)
AST: 19 U/L (ref 15–41)
Albumin: 3.4 g/dL — ABNORMAL LOW (ref 3.5–5.0)
Alkaline Phosphatase: 42 U/L (ref 38–126)
Anion gap: 6 (ref 5–15)
BUN: 6 mg/dL (ref 6–20)
CO2: 28 mmol/L (ref 22–32)
Calcium: 8.6 mg/dL — ABNORMAL LOW (ref 8.9–10.3)
Chloride: 103 mmol/L (ref 98–111)
Creatinine, Ser: 0.71 mg/dL (ref 0.44–1.00)
GFR, Estimated: >60 mL/min (ref >60)
Glucose, Bld: 79 mg/dL (ref 70–99)
Potassium: 3.4 mmol/L — ABNORMAL LOW (ref 3.5–5.1)
Sodium: 137 mmol/L (ref 135–145)
Total Bilirubin: 0.6 mg/dL (ref 0.3–1.2)
Total Protein: 6.9 g/dL (ref 6.5–8.1)

## 2021-06-03 LAB — HIV ANTIBODY (ROUTINE TESTING W REFLEX): HIV Screen 4th Generation wRfx: NONREACTIVE

## 2021-06-03 SURGERY — LAPAROSCOPIC CHOLECYSTECTOMY WITH INTRAOPERATIVE CHOLANGIOGRAM
Anesthesia: General | Site: Abdomen

## 2021-06-03 MED ORDER — DEXAMETHASONE SODIUM PHOSPHATE 10 MG/ML IJ SOLN
INTRAMUSCULAR | Status: AC
  Filled 2021-06-03: qty 1

## 2021-06-03 MED ORDER — ONDANSETRON HCL 4 MG/2ML IJ SOLN: 4.0000 mg | Freq: Once | INTRAMUSCULAR | Status: DC | PRN

## 2021-06-03 MED ORDER — OXYCODONE HCL 5 MG/5ML PO SOLN: 5.0000 mg | Freq: Once | ORAL | Status: DC | PRN

## 2021-06-03 MED ORDER — PHENYLEPHRINE 40 MCG/ML (10ML) SYRINGE FOR IV PUSH (FOR BLOOD PRESSURE SUPPORT)
PREFILLED_SYRINGE | INTRAVENOUS | Status: DC | PRN
  Administered 2021-06-03: 80 ug via INTRAVENOUS

## 2021-06-03 MED ORDER — FENTANYL CITRATE (PF) 100 MCG/2ML IJ SOLN
INTRAMUSCULAR | Status: AC
  Administered 2021-06-03: 25 ug via INTRAVENOUS
  Filled 2021-06-03: qty 2

## 2021-06-03 MED ORDER — FENTANYL CITRATE (PF) 100 MCG/2ML IJ SOLN
25.0000 ug | INTRAMUSCULAR | Status: DC | PRN
  Administered 2021-06-03: 50 ug via INTRAVENOUS

## 2021-06-03 MED ORDER — ONDANSETRON HCL 4 MG/2ML IJ SOLN
INTRAMUSCULAR | Status: AC
  Filled 2021-06-03: qty 2

## 2021-06-03 MED ORDER — FENTANYL CITRATE (PF) 100 MCG/2ML IJ SOLN
INTRAMUSCULAR | Status: AC
  Filled 2021-06-03: qty 2

## 2021-06-03 MED ORDER — MEPERIDINE HCL 50 MG/ML IJ SOLN: 6.2500 mg | INTRAMUSCULAR | Status: DC | PRN

## 2021-06-03 MED ORDER — LIDOCAINE 2% (20 MG/ML) 5 ML SYRINGE
INTRAMUSCULAR | Status: AC
  Filled 2021-06-03: qty 5

## 2021-06-03 MED ORDER — DEXAMETHASONE SODIUM PHOSPHATE 10 MG/ML IJ SOLN
INTRAMUSCULAR | Status: DC | PRN
  Administered 2021-06-03: 10 mg via INTRAVENOUS

## 2021-06-03 MED ORDER — VANCOMYCIN HCL IN DEXTROSE 1-5 GM/200ML-% IV SOLN
1000.0000 mg | INTRAVENOUS | Status: AC
  Administered 2021-06-03 (×2): 1000 mg via INTRAVENOUS
  Filled 2021-06-03: qty 200

## 2021-06-03 MED ORDER — HYDROMORPHONE HCL 1 MG/ML IJ SOLN: 1.0000 mg | INTRAMUSCULAR | Status: DC | PRN

## 2021-06-03 MED ORDER — PANTOPRAZOLE SODIUM 40 MG IV SOLR
40.0000 mg | Freq: Two times a day (BID) | INTRAVENOUS | Status: DC
  Administered 2021-06-03 – 2021-06-04 (×2): 40 mg via INTRAVENOUS
  Filled 2021-06-03 (×3): qty 40

## 2021-06-03 MED ORDER — PROPOFOL 10 MG/ML IV BOLUS
INTRAVENOUS | Status: AC
  Filled 2021-06-03: qty 20

## 2021-06-03 MED ORDER — LACTATED RINGERS IV SOLN: INTRAVENOUS | Status: DC | PRN

## 2021-06-03 MED ORDER — ACETAMINOPHEN 160 MG/5ML PO SOLN: 325.0000 mg | ORAL | Status: DC | PRN

## 2021-06-03 MED ORDER — MIDAZOLAM HCL 2 MG/2ML IJ SOLN
INTRAMUSCULAR | Status: DC | PRN
  Administered 2021-06-03: 2 mg via INTRAVENOUS

## 2021-06-03 MED ORDER — BUPIVACAINE HCL (PF) 0.5 % IJ SOLN
INTRAMUSCULAR | Status: DC | PRN
  Administered 2021-06-03: 20 mL

## 2021-06-03 MED ORDER — OXYCODONE HCL 5 MG PO TABS
ORAL_TABLET | ORAL | Status: AC
  Filled 2021-06-03: qty 1

## 2021-06-03 MED ORDER — BUPIVACAINE HCL (PF) 0.5 % IJ SOLN
INTRAMUSCULAR | Status: AC
  Filled 2021-06-03: qty 30

## 2021-06-03 MED ORDER — KETOROLAC TROMETHAMINE 30 MG/ML IJ SOLN
INTRAMUSCULAR | Status: AC
  Administered 2021-06-03: 30 mg via INTRAVENOUS
  Filled 2021-06-03: qty 1

## 2021-06-03 MED ORDER — SUGAMMADEX SODIUM 200 MG/2ML IV SOLN
INTRAVENOUS | Status: DC | PRN
  Administered 2021-06-03: 200 mg via INTRAVENOUS

## 2021-06-03 MED ORDER — FENTANYL CITRATE (PF) 100 MCG/2ML IJ SOLN
INTRAMUSCULAR | Status: DC | PRN
  Administered 2021-06-03 (×2): 50 ug via INTRAVENOUS

## 2021-06-03 MED ORDER — ROCURONIUM BROMIDE 10 MG/ML (PF) SYRINGE
PREFILLED_SYRINGE | INTRAVENOUS | Status: DC | PRN
  Administered 2021-06-03: 70 mg via INTRAVENOUS

## 2021-06-03 MED ORDER — HYDROMORPHONE HCL 2 MG/ML IJ SOLN
2.0000 mg | INTRAMUSCULAR | Status: AC
Start: 2021-06-03 — End: 2021-06-03
  Administered 2021-06-03: 2 mg via INTRAVENOUS
  Filled 2021-06-03: qty 1

## 2021-06-03 MED ORDER — PROPOFOL 10 MG/ML IV BOLUS
INTRAVENOUS | Status: DC | PRN
  Administered 2021-06-03: 150 mg via INTRAVENOUS

## 2021-06-03 MED ORDER — FENTANYL CITRATE (PF) 100 MCG/2ML IJ SOLN
INTRAMUSCULAR | Status: AC
  Administered 2021-06-03: 50 ug via INTRAVENOUS
  Filled 2021-06-03: qty 2

## 2021-06-03 MED ORDER — PHENYLEPHRINE 40 MCG/ML (10ML) SYRINGE FOR IV PUSH (FOR BLOOD PRESSURE SUPPORT)
PREFILLED_SYRINGE | INTRAVENOUS | Status: AC
  Filled 2021-06-03: qty 10

## 2021-06-03 MED ORDER — OXYCODONE HCL 5 MG PO TABS
15.0000 mg | ORAL_TABLET | ORAL | Status: DC | PRN
  Administered 2021-06-03 – 2021-06-05 (×6): 15 mg via ORAL
  Filled 2021-06-03 (×7): qty 3

## 2021-06-03 MED ORDER — LIDOCAINE 2% (20 MG/ML) 5 ML SYRINGE
INTRAMUSCULAR | Status: DC | PRN
  Administered 2021-06-03: 100 mg via INTRAVENOUS

## 2021-06-03 MED ORDER — ROCURONIUM BROMIDE 10 MG/ML (PF) SYRINGE
PREFILLED_SYRINGE | INTRAVENOUS | Status: AC
  Filled 2021-06-03: qty 10

## 2021-06-03 MED ORDER — HYDROMORPHONE HCL 1 MG/ML IJ SOLN
1.0000 mg | Freq: Four times a day (QID) | INTRAMUSCULAR | Status: DC | PRN
  Administered 2021-06-03: 1 mg via INTRAVENOUS
  Filled 2021-06-03: qty 1

## 2021-06-03 MED ORDER — ONDANSETRON HCL 4 MG/2ML IJ SOLN
INTRAMUSCULAR | Status: DC | PRN
  Administered 2021-06-03: 4 mg via INTRAVENOUS

## 2021-06-03 MED ORDER — KETOROLAC TROMETHAMINE 30 MG/ML IJ SOLN: 30.0000 mg | Freq: Once | INTRAMUSCULAR | Status: AC | PRN

## 2021-06-03 MED ORDER — OXYCODONE HCL 5 MG PO TABS: 5.0000 mg | ORAL_TABLET | Freq: Once | ORAL | Status: DC | PRN

## 2021-06-03 MED ORDER — KETOROLAC TROMETHAMINE 30 MG/ML IJ SOLN
15.0000 mg | Freq: Four times a day (QID) | INTRAMUSCULAR | Status: DC
  Administered 2021-06-03 – 2021-06-05 (×8): 15 mg via INTRAVENOUS
  Filled 2021-06-03 (×8): qty 1

## 2021-06-03 MED ORDER — MIDAZOLAM HCL 2 MG/2ML IJ SOLN
INTRAMUSCULAR | Status: AC
  Filled 2021-06-03: qty 2

## 2021-06-03 MED ORDER — ACETAMINOPHEN 325 MG PO TABS: 325.0000 mg | ORAL_TABLET | ORAL | Status: DC | PRN

## 2021-06-03 MED ORDER — LORATADINE 10 MG PO TABS
10.0000 mg | ORAL_TABLET | Freq: Every evening | ORAL | Status: DC
  Administered 2021-06-03 – 2021-06-04 (×2): 10 mg via ORAL
  Filled 2021-06-03 (×2): qty 1

## 2021-06-03 SURGICAL SUPPLY — 29 items
APPLIER CLIP 5 13 M/L LIGAMAX5 (MISCELLANEOUS) ×2
CABLE HIGH FREQUENCY MONO STRZ (ELECTRODE) ×2 IMPLANT
CHLORAPREP W/TINT 26 (MISCELLANEOUS) ×2 IMPLANT
CLIP APPLIE 5 13 M/L LIGAMAX5 (MISCELLANEOUS) ×1 IMPLANT
COVER MAYO STAND STRL (DRAPES) IMPLANT
COVER WAND RF STERILE (DRAPES) IMPLANT
DECANTER SPIKE VIAL GLASS SM (MISCELLANEOUS) ×2 IMPLANT
DERMABOND ADVANCED (GAUZE/BANDAGES/DRESSINGS) ×1
DERMABOND ADVANCED .7 DNX12 (GAUZE/BANDAGES/DRESSINGS) ×1 IMPLANT
DRAPE C-ARM 42X120 X-RAY (DRAPES) IMPLANT
ELECT REM PT RETURN 15FT ADLT (MISCELLANEOUS) ×2 IMPLANT
GLOVE SURG ENC MOIS LTX SZ7.5 (GLOVE) ×2 IMPLANT
GOWN STRL REUS W/TWL XL LVL3 (GOWN DISPOSABLE) ×4 IMPLANT
HEMOSTAT SURGICEL 4X8 (HEMOSTASIS) IMPLANT
KIT BASIN OR (CUSTOM PROCEDURE TRAY) ×2 IMPLANT
KIT TURNOVER KIT A (KITS) ×2 IMPLANT
PENCIL SMOKE EVACUATOR (MISCELLANEOUS) IMPLANT
POUCH SPECIMEN RETRIEVAL 10MM (ENDOMECHANICALS) ×2 IMPLANT
SCISSORS LAP 5X35 DISP (ENDOMECHANICALS) ×2 IMPLANT
SET CHOLANGIOGRAPH MIX (MISCELLANEOUS) IMPLANT
SET IRRIG TUBING LAPAROSCOPIC (IRRIGATION / IRRIGATOR) ×2 IMPLANT
SET TUBE SMOKE EVAC HIGH FLOW (TUBING) ×2 IMPLANT
SLEEVE XCEL OPT CAN 5 100 (ENDOMECHANICALS) ×4 IMPLANT
SUT MNCRL AB 4-0 PS2 18 (SUTURE) ×2 IMPLANT
TOWEL OR 17X26 10 PK STRL BLUE (TOWEL DISPOSABLE) ×2 IMPLANT
TOWEL OR NON WOVEN STRL DISP B (DISPOSABLE) ×2 IMPLANT
TRAY LAPAROSCOPIC (CUSTOM PROCEDURE TRAY) ×2 IMPLANT
TROCAR BLADELESS OPT 5 100 (ENDOMECHANICALS) ×2 IMPLANT
TROCAR XCEL BLUNT TIP 100MML (ENDOMECHANICALS) ×2 IMPLANT

## 2021-06-03 NOTE — Discharge Instructions (Addendum)
[facility]  Please arrive at least 30 min before your appointment to complete your check in paperwork.  If you are unable to arrive 30 min prior to your appointment time we may have to cancel or reschedule you. LAPAROSCOPIC SURGERY: POST OP INSTRUCTIONS Always review your discharge instruction sheet given to you by the facility where your surgery was performed. IF YOU HAVE DISABILITY OR FAMILY LEAVE FORMS, YOU MUST BRING THEM TO THE OFFICE FOR PROCESSING.   DO NOT GIVE THEM TO YOUR DOCTOR.  PAIN CONTROL  First take acetaminophen (Tylenol) AND/or ibuprofen (Advil) to control your pain after surgery.  Follow directions on package.  Taking acetaminophen (Tylenol) and/or ibuprofen (Advil) regularly after surgery will help to control your pain and lower the amount of prescription pain medication you may need.  You should not take more than 4,000 mg (4 grams) of acetaminophen (Tylenol) in 24 hours.  You should not take ibuprofen (Advil), aleve, motrin, naprosyn or other NSAIDS if you have a history of stomach ulcers or chronic kidney disease.  A prescription for pain medication may be given to you upon discharge.  Take your pain medication as prescribed, if you still have uncontrolled pain after taking acetaminophen (Tylenol) or ibuprofen (Advil). Use ice packs to help control pain. If you need a refill on your pain medication, please contact your pharmacy.  They will contact our office to request authorization. Prescriptions will not be filled after 5pm or on week-ends.  HOME MEDICATIONS Take your usually prescribed medications unless otherwise directed.  DIET You should follow a light diet the first few days after arrival home.  Be sure to include lots of fluids daily. Avoid fatty, fried foods.   CONSTIPATION It is common to experience some constipation after surgery and if you are taking pain medication.  Increasing fluid intake and taking a stool softener (such as Colace)  will usually help or prevent this problem from occurring.  A mild laxative (Milk of Magnesia or Miralax) should be taken according to package instructions if there are no bowel movements after 48 hours.  WOUND/INCISION CARE Most patients will experience some swelling and bruising in the area of the incisions.  Ice packs will help.  Swelling and bruising can take several days to resolve.  Unless discharge instructions indicate otherwise, follow guidelines below  STERI-STRIPS - you may remove your outer bandages 48 hours after surgery, and you may shower at that time.  You have steri-strips (small skin tapes) in place directly over the incision.  These strips should be left on the skin for 7-10 days.   DERMABOND/SKIN GLUE - you may shower in 24 hours.  The glue will flake off over the next 2-3 weeks. Any sutures or staples will be removed at the office during your follow-up visit.  ACTIVITIES You may resume regular (light) daily activities beginning the next day--such as daily self-care, walking, climbing stairs--gradually increasing activities as tolerated.  You may have sexual intercourse when it is comfortable.  Refrain from any heavy lifting or straining until approved by your doctor. You may drive when you are no longer taking prescription pain medication, you can comfortably wear a seatbelt, and you can safely maneuver your car and apply brakes.  FOLLOW-UP You should see your doctor in the office for a follow-up appointment approximately 2-3 weeks after your surgery.  You should have been given your post-op/follow-up appointment when your surgery was scheduled.  If you did not receive a post-op/follow-up appointment, make sure   that you call for this appointment within a day or two after you arrive home to insure a convenient appointment time.   WHEN TO CALL YOUR DOCTOR: Fever over 101.0 Inability to urinate Continued bleeding from incision. Increased pain, redness, or drainage from the  incision. Increasing abdominal pain  The clinic staff is available to answer your questions during regular business hours.  Please don't hesitate to call and ask to speak to one of the nurses for clinical concerns.  If you have a medical emergency, go to the nearest emergency room or call 911.  A surgeon from [facility] is always on call at the hospital. [address], [address], [place], [place]  [address] ? [address] [address], [place], [place]   [address] [phone] ? [phone] ? FAX [phone]  

## 2021-06-03 NOTE — Anesthesia Postprocedure Evaluation (Signed)
Anesthesia Post Note  Patient: Hannah Hawkins  Procedure(s) Performed: LAPAROSCOPIC CHOLECYSTECTOMY (Abdomen)     Patient location during evaluation: PACU Anesthesia Type: General Level of consciousness: sedated Pain management: pain level controlled Vital Signs Assessment: post-procedure vital signs reviewed and stable Respiratory status: spontaneous breathing Cardiovascular status: stable Postop Assessment: no apparent nausea or vomiting Anesthetic complications: no   No notable events documented.  Last Vitals:  Vitals:   06/03/21 1315 06/03/21 1338  BP: (!) 155/106   Pulse: (!) 50   Resp: 10   Temp:    SpO2: 100% 100%    Last Pain:  Vitals:   06/03/21 1352  TempSrc:   PainSc: 8                  John F Scharlene Corn

## 2021-06-03 NOTE — ED Notes (Signed)
Pt wanting IV pain medication, paged MD to make aware, await call back.

## 2021-06-03 NOTE — Transfer of Care (Signed)
Immediate Anesthesia Transfer of Care Note  Patient: Hannah Hawkins  Procedure(s) Performed: LAPAROSCOPIC CHOLECYSTECTOMY (Abdomen)  Patient Location: PACU  Anesthesia Type:General  Level of Consciousness: drowsy and patient cooperative  Airway & Oxygen Therapy: Patient Spontanous Breathing and Patient connected to face mask oxygen  Post-op Assessment: Report given to RN and Post -op Vital signs reviewed and stable  Post vital signs: Reviewed and stable  Last Vitals:  Vitals Value Taken Time  BP 142/109 06/03/21 1301  Temp    Pulse 49 06/03/21 1303  Resp 10 06/03/21 1303  SpO2 100 % 06/03/21 1303  Vitals shown include unvalidated device data.  Last Pain:  Vitals:   06/03/21 1135  TempSrc:   PainSc: 10-Worst pain ever         Complications: No notable events documented.

## 2021-06-03 NOTE — Consult Note (Signed)
Surgical Evaluation Requesting provider: Dr. Lacretia Leigh  Chief Complaint: abdominal pain  HPI: 35 year old woman with a history of sickle cell disease, chronic anemia, chronic pain, opioid dependence and polysubstance abuse (denies recent). Presents with crescendoing epigastric and right upper quadrant pain and nausea.  This has been intermittent for the last 2 weeks or so, but became constant yesterday and began to radiate into the right chest.  This is different than her previous sickle cell crises. Some exacerbation with eating. No known fever. Currently reports ongoing aching in the epigastrium and RUQ but the pain that had been radiating into the chest is better.  She denies any previous abdominal surgery.  She works as an Careers adviser.  Allergies  Allergen Reactions   Penicillins Swelling and Rash    Has patient had a PCN reaction causing immediate rash, facial/tongue/throat swelling, SOB or lightheadedness with hypotension: Yes Has patient had a PCN reaction causing severe rash involving mucus membranes or skin necrosis: Yes Has patient had a PCN reaction that required hospitalization No Has patient had a PCN reaction occurring within the last 10 years: no If all of the above answers are "NO", then may proceed with Cephalosporin use.     Past Medical History:  Diagnosis Date   Sickle cell anemia (HCC)     Past Surgical History:  Procedure Laterality Date   EYE SURGERY  03/09/2017   left eye   EYE SURGERY Right 09/2019   right hemerage     Family History  Problem Relation Age of Onset   Diabetes Father     Social History   Socioeconomic History   Marital status: Single    Spouse name: Not on file   Number of children: Not on file   Years of education: Not on file   Highest education level: Not on file  Occupational History   Not on file  Tobacco Use   Smoking status: Former    Pack years: 0.00    Types: Cigarettes   Smokeless tobacco: Never  Vaping  Use   Vaping Use: Never used  Substance and Sexual Activity   Alcohol use: Not Currently   Drug use: No   Sexual activity: Yes  Other Topics Concern   Not on file  Social History Narrative   Not on file   Social Determinants of Health   Financial Resource Strain: Not on file  Food Insecurity: Not on file  Transportation Needs: Not on file  Physical Activity: Not on file  Stress: Not on file  Social Connections: Not on file    No current facility-administered medications on file prior to encounter.   Current Outpatient Medications on File Prior to Encounter  Medication Sig Dispense Refill   citalopram (CELEXA) 20 MG tablet Take 1 tablet (20 mg total) by mouth daily. 30 tablet 11   ibuprofen (ADVIL) 800 MG tablet Take 1 tablet (800 mg total) by mouth 3 (three) times daily. 90 tablet 11   levocetirizine (XYZAL) 5 MG tablet Take 5 mg by mouth every evening.     lidocaine (LIDODERM) 5 % Place 1 patch onto the skin daily. Remove & Discard patch within 12 hours or as directed by MD 30 patch 2   montelukast (SINGULAIR) 10 MG tablet Take 1 tablet (10 mg total) by mouth at bedtime. 30 tablet 3   NARCAN 4 MG/0.1ML LIQD nasal spray kit Place 4 mg into the nose once as needed (overdose).  0   oxyCODONE (ROXICODONE) 15 MG immediate release  tablet Take 1 tablet (15 mg total) by mouth every 6 (six) hours as needed for up to 15 days for pain (moderate to severe pain). 60 tablet 0   promethazine (PHENERGAN) 12.5 MG tablet Take 1 tablet (12.5 mg total) by mouth every 4 (four) hours as needed for nausea or vomiting. 30 tablet 0   tiZANidine (ZANAFLEX) 2 MG tablet Take 1 tablet (2 mg total) by mouth every 6 (six) hours as needed for muscle spasms. 45 tablet 2    Review of Systems: a complete, 10pt review of systems was completed with pertinent positives and negatives as documented in the HPI  Physical Exam: Vitals:   06/03/21 0330 06/03/21 0615  BP: 105/82 117/64  Pulse: 77 71  Resp: 16 17   Temp:    SpO2: 98% 93%   Gen: A&Ox3, no distress but falling asleep mid-conversation Eyes: lids and conjunctivae normal, no icterus. Pupils equally round and reactive to light.  Neck: supple without mass or thyromegaly Chest: respiratory effort is normal. No crepitus or tenderness on palpation of the chest. Breath sounds equal.  Cardiovascular: RRR with palpable distal pulses, no pedal edema Gastrointestinal: soft, nondistended, mildly tender in RUQ and epigastrium. No mass, hepatomegaly or splenomegaly. No hernia. Muscoloskeletal: no clubbing or cyanosis of the fingers.  Strength is symmetrical throughout.  Range of motion of bilateral upper and lower extremities normal without pain, crepitation or contracture. Neuro: cranial nerves grossly intact.  Sensation intact to light touch diffusely. Psych: appropriate mood and affect, normal insight/judgment intact  Skin: warm and dry   CBC Latest Ref Rng & Units 06/03/2021 06/02/2021 05/15/2021  WBC 4.0 - 10.5 K/uL 11.5(H) 11.6(H) 9.7  Hemoglobin 12.0 - 15.0 g/dL 9.5(L) 10.2(L) 11.7(L)  Hematocrit 36.0 - 46.0 % 26.3(L) 27.4(L) 31.6(L)  Platelets 150 - 400 K/uL 424(H) 444(H) 399    CMP Latest Ref Rng & Units 06/03/2021 06/02/2021 05/15/2021  Glucose 70 - 99 mg/dL 79 95 92  BUN 6 - 20 mg/dL '6 7 9  ' Creatinine 0.44 - 1.00 mg/dL 0.71 0.59 0.74  Sodium 135 - 145 mmol/L 137 138 139  Potassium 3.5 - 5.1 mmol/L 3.4(L) 3.2(L) 3.6  Chloride 98 - 111 mmol/L 103 101 106  CO2 22 - 32 mmol/L '28 26 25  ' Calcium 8.9 - 10.3 mg/dL 8.6(L) 8.8(L) 9.1  Total Protein 6.5 - 8.1 g/dL 6.9 7.9 8.5(H)  Total Bilirubin 0.3 - 1.2 mg/dL 0.6 1.1 0.6  Alkaline Phos 38 - 126 U/L 42 47 49  AST 15 - 41 U/L '19 19 17  ' ALT 0 - 44 U/L '11 12 12    ' Lab Results  Component Value Date   INR 1.1 02/24/2021   INR 1.2 08/06/2020    Imaging: DG Chest 2 View  Result Date: 06/02/2021 CLINICAL DATA:  Chest pain EXAM: CHEST - 2 VIEW COMPARISON:  08/06/2020 FINDINGS: The heart size  and mediastinal contours are within normal limits. Both lungs are clear. The visualized skeletal structures are unremarkable. IMPRESSION: No active cardiopulmonary disease. Electronically Signed   By: Donavan Foil M.D.   On: 06/02/2021 19:58   US Abdomen Limited  Result Date: 06/02/2021 CLINICAL DATA:  Right upper quadrant pain for 2 weeks. EXAM: ULTRASOUND ABDOMEN LIMITED RIGHT UPPER QUADRANT COMPARISON:  CT 05/15/2021 FINDINGS: Gallbladder: Cholelithiasis with multiple stones in the dependent gallbladder. Largest measures about 5 mm. Layering sludge is also present. No gallbladder wall thickening or edema. Murphy's sign is negative. Common bile duct: Diameter: 3 mm, normal Liver: No  focal lesion identified. Within normal limits in parenchymal echogenicity. Portal vein is patent on color Doppler imaging with normal direction of blood flow towards the liver. Other: None. IMPRESSION: Cholelithiasis and gallbladder sludge. No additional changes to suggest cholecystitis. Electronically Signed   By: Lucienne Capers M.D.   On: 06/02/2021 21:17     A/P: Likely early acute cholecystitis on several weeks of biliary colic. I recommend proceeding with laparoscopic cholecystectomy with possible cholangiogram. Discussed risks of surgery including bleeding, pain, scarring, intraabdominal injury specifically to the common bile duct and sequelae, bile leak, conversion to open surgery, blood clot, pneumonia, heart attack, stroke, failure to resolve symptoms, etc. Questions welcomed and answered. Will tentatively plan for lap chole later today with Dr. Ninfa Linden.     Patient Active Problem List   Diagnosis Date Noted   Cholelithiasis 06/02/2021   Epigastric pain    Lactic acidosis    RUQ abdominal pain    Rhabdomyolysis 08/07/2020   Sickle cell crisis ([place]) 08/06/2020   Elevated LFTs 08/06/2020   Cyst of spleen 08/06/2020   Leukocytosis 08/06/2020   SIRS (systemic inflammatory response syndrome) (HCC)  08/06/2020   Non-intractable vomiting    Joint pain in fingers of both hands 06/11/2020   Hypokalemia 07/17/2019   GERD (gastroesophageal reflux disease) 05/29/2019   History of cocaine use 05/18/2019   Chronic pain syndrome    Cocaine use    Sickle cell pain crisis ([place]) 11/14/2017   Anemia of chronic disease 11/13/2017   Sinusitis, chronic 11/13/2017   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

## 2021-06-03 NOTE — ED Notes (Signed)
Spoke to sickle cell team and instructed to order UDS. Order placed and patient informed

## 2021-06-03 NOTE — Progress Notes (Signed)
Patient ID: Hannah Hawkins, female   DOB: Apr 09, 1986, 35 y.o.   MRN: [id number]   Plan OR today for a lap chole  I discussed the procedure in detail.  .  We discussed the risks and benefits of a laparoscopic cholecystectomy and possible cholangiogram including, but not limited to bleeding, infection, injury to surrounding structures such as the intestine or liver, bile leak, retained gallstones, need to convert to an open procedure, prolonged diarrhea, blood clots such as  DVT, common bile duct injury, anesthesia risks, and possible need for additional procedures.  The likelihood of improvement in symptoms and return to the patient's normal status is good. We discussed the typical post-operative recovery course.

## 2021-06-03 NOTE — ED Notes (Signed)
Pt is aware urine sample needed. Toileting offered.

## 2021-06-03 NOTE — Op Note (Signed)
Laparoscopic Cholecystectomy Procedure Note  Indications: This patient presents with symptomatic gallbladder disease and will undergo laparoscopic cholecystectomy.  Pre-operative Diagnosis: acute cholecystitis with cholelithiasis  Post-operative Diagnosis: Same  Surgeon: Abigail Miyamoto MD  Assistants: Leary Roca PA  Anesthesia: General endotracheal anesthesia  ASA Class: 2  Procedure Details  The patient was seen again in the Holding Room. The risks, benefits, complications, treatment options, and expected outcomes were discussed with the patient. The possibilities of reaction to medication, pulmonary aspiration, perforation of viscus, bleeding, recurrent infection, finding a normal gallbladder, the need for additional procedures, failure to diagnose a condition, the possible need to convert to an open procedure, and creating a complication requiring transfusion or operation were discussed with the patient. The likelihood of improving the patient's symptoms with return to their baseline status is good.  The patient and/or family concurred with the proposed plan, giving informed consent. The site of surgery properly noted. The patient was taken to Operating Room, identified as Hannah Hawkins and the procedure verified as Laparoscopic Cholecystectomy with Intraoperative Cholangiogram. A Time Out was held and the above information confirmed.  Prior to the induction of general anesthesia, antibiotic prophylaxis was administered. General endotracheal anesthesia was then administered and tolerated well. After the induction, the abdomen was prepped with Chloraprep and draped in sterile fashion. The patient was positioned in the supine position.  Local anesthetic agent was injected into the skin near the umbilicus and an incision made. We dissected down to the abdominal fascia with blunt dissection.  The fascia was incised vertically and we entered the peritoneal cavity bluntly.  A pursestring  suture of 0-Vicryl was placed around the fascial opening.  The Hasson cannula was inserted and secured with the stay suture.  Pneumoperitoneum was then created with CO2 and tolerated well without any adverse changes in the patient's vital signs. A 5-mm port was placed in the subxiphoid position.  Two 5-mm ports were placed in the right upper quadrant. All skin incisions were infiltrated with a local anesthetic agent before making the incision and placing the trocars.   We positioned the patient in reverse Trendelenburg, tilted slightly to the patient's left.  The gallbladder was identified, the fundus grasped and retracted cephalad. Adhesions were lysed bluntly and with the electrocautery where indicated, taking care not to injure any adjacent organs or viscus. The infundibulum was grasped and retracted laterally, exposing the peritoneum overlying the triangle of Calot. This was then divided and exposed in a blunt fashion. The cystic duct was clearly identified and bluntly dissected circumferentially. A critical view of the cystic duct and cystic artery was obtained.  The cystic duct was then ligated with clips and divided. The cystic artery was, dissected free, ligated with clips and divided as well.   The gallbladder was dissected from the liver bed in retrograde fashion with the electrocautery. The gallbladder was removed and placed in an Endocatch sac. The liver bed was irrigated and inspected. Hemostasis was achieved with the electrocautery. Copious irrigation was utilized and was repeatedly aspirated until clear.  The gallbladder and Endocatch sac were then removed through the umbilical port site.  The pursestring suture was used to close the umbilical fascia as well as a second figure of eight suture.  We again inspected the right upper quadrant for hemostasis.  Pneumoperitoneum was released as we removed the trocars.  4-0 Monocryl was used to close the skin.   Skin glue was then applied. The patient  was then extubated and brought to the recovery  room in stable condition. Instrument, sponge, and needle counts were correct at closure and at the conclusion of the case.   Findings: Cholecystitis with Cholelithiasis  Estimated Blood Loss: Minimal         Drains: 0         Specimens: Gallbladder           Complications: None; patient tolerated the procedure well.         Disposition: PACU - hemodynamically stable.         Condition: stable

## 2021-06-03 NOTE — ED Notes (Signed)
Paged on-call floor coverage number to make aware pt wanting IV pain medication.  Awaiting call back.

## 2021-06-03 NOTE — ED Notes (Signed)
Report given to short stay. Pt instructed to remove all clothing and wipe with CHG wipes and place clean gown on. Pt is drifting off to sleep while explaining this. Pt requesting dilaudid. MD aware. Pt asked to sign consent this am and refused until oxycodone was received. After administering meds she still refused until dilaudid admin. Explained no PRN order for this and will have to ask MD

## 2021-06-03 NOTE — ED Notes (Signed)
Pt given sandwich, saltine crackers, applesauce, and a coke.

## 2021-06-03 NOTE — ED Notes (Signed)
Patient to short stay.  

## 2021-06-03 NOTE — Anesthesia Preprocedure Evaluation (Signed)
Anesthesia Evaluation  Patient identified by MRN, date of birth, ID band Patient awake    Reviewed: Allergy & Precautions, NPO status , Patient's Chart, lab work & pertinent test results  Airway Mallampati: I       Dental no notable dental hx. (+) Teeth Intact   Pulmonary Patient abstained from smoking., former smoker,    Pulmonary exam normal breath sounds clear to auscultation       Cardiovascular negative cardio ROS Normal cardiovascular exam     Neuro/Psych negative psych ROS   GI/Hepatic (+)     substance abuse  cocaine use,   Endo/Other    Renal/GU   negative genitourinary   Musculoskeletal negative musculoskeletal ROS (+)   Abdominal Normal abdominal exam  (+)   Peds  Hematology  (+) Blood dyscrasia, Sickle cell anemia and anemia ,   Anesthesia Other Findings   Reproductive/Obstetrics                             Anesthesia Physical Anesthesia Plan  ASA: 2  Anesthesia Plan: General   Post-op Pain Management:    Induction: Intravenous  PONV Risk Score and Plan: 4 or greater and Ondansetron, Dexamethasone and Midazolam  Airway Management Planned: Oral ETT  Additional Equipment: None  Intra-op Plan:   Post-operative Plan: Extubation in OR  Informed Consent: I have reviewed the patients History and Physical, chart, labs and discussed the procedure including the risks, benefits and alternatives for the proposed anesthesia with the patient or authorized representative who has indicated his/her understanding and acceptance.     Dental advisory given  Plan Discussed with: CRNA  Anesthesia Plan Comments:         Anesthesia Quick Evaluation

## 2021-06-03 NOTE — ED Notes (Signed)
Pt removed heart monitor and leads from chest, stating the leads make her itch. Pt refused to let writer place leads back on.

## 2021-06-03 NOTE — Progress Notes (Signed)
Subjective: Hannah Hawkins is a 35 year old female with a medical history significant for sickle cell disease, chronic pain syndrome, opiate dependence and tolerance, history of depression, and history of polysubstance abuse was admitted for acute cholecystitis with cholelithiasis in the setting of sickle cell crisis.  Patient underwent cholecystectomy under general anesthesia without complication.  She is reporting significant pain primarily to her abdomen.  Also, patient reports some lower extremity pain that is consistent with previous sickle cell crisis.  She denies any headache, chest pain, shortness of breath, nausea or vomiting, or diarrhea.  Patient is currently very sleepy.  Objective:  Vital signs in last 24 hours:  Vitals:   06/03/21 1500 06/03/21 1530 06/03/21 1558 06/03/21 1725  BP: (!) 137/97 (!) 128/91 (!) 143/99 (!) 141/91  Pulse: 65 (!) 59 (!) 56 65  Resp: 14 11 18 20   Temp:   98.4 F (36.9 C) (!) 97.5 F (36.4 C)  TempSrc:    Oral  SpO2: 98% 97% 100% 99%  Weight:      Height:        Intake/Output from previous day:   Intake/Output Summary (Last 24 hours) at 06/03/2021 1728 Last data filed at 06/03/2021 1600 Gross per 24 hour  Intake 1952.5 ml  Output 5 ml  Net 1947.5 ml    Physical Exam: General: Alert, awake, oriented x3, in no acute distress.  Sleepy. HEENT: [place]/AT PEERL, EOMI Neck: Trachea midline,  no masses, no thyromegal,y no JVD, no carotid bruit OROPHARYNX:  Moist, No exudate/ erythema/lesions.  Heart: Regular rate and rhythm, without murmurs, rubs, gallops, PMI non-displaced, no heaves or thrills on palpation.  Lungs: Clear to auscultation, no wheezing or rhonchi noted. No increased vocal fremitus resonant to percussion  Abdomen: Tender, positive bowel sounds Neuro: No focal neurological deficits noted cranial nerves II through XII grossly intact. DTRs 2+ bilaterally upper and lower extremities. Strength 5 out of 5 in bilateral upper and lower  extremities. Musculoskeletal: No warm swelling or erythema around joints, no spinal tenderness noted. Psychiatric: Patient alert and oriented x3, good insight and cognition, good recent to remote recall. Lymph node survey: No cervical axillary or inguinal lymphadenopathy noted.  Lab Results:  Basic Metabolic Panel:    Component Value Date/Time   NA 137 06/03/2021 0500   NA 138 03/07/2020 1532   K 3.4 (L) 06/03/2021 0500   CL 103 06/03/2021 0500   CO2 28 06/03/2021 0500   BUN 6 06/03/2021 0500   BUN 6 03/07/2020 1532   CREATININE 0.71 06/03/2021 0500   CREATININE 0.69 08/04/2017 1208   GLUCOSE 79 06/03/2021 0500   CALCIUM 8.6 (L) 06/03/2021 0500   CBC:    Component Value Date/Time   WBC 11.5 (H) 06/03/2021 0500   HGB 9.5 (L) 06/03/2021 0500   HGB 9.9 (L) 03/07/2020 1532   HCT 26.3 (L) 06/03/2021 0500   HCT 28.7 (L) 03/07/2020 1532   PLT 424 (H) 06/03/2021 0500   PLT 311 03/07/2020 1532   MCV 72.7 (L) 06/03/2021 0500   MCV 89 03/07/2020 1532   NEUTROABS 4.7 05/15/2021 1929   NEUTROABS 4.7 03/07/2020 1532   LYMPHSABS 3.1 05/15/2021 1929   LYMPHSABS 5.0 (H) 03/07/2020 1532   MONOABS 0.7 05/15/2021 1929   EOSABS 1.0 (H) 05/15/2021 1929   EOSABS 0.7 (H) 03/07/2020 1532   BASOSABS 0.1 05/15/2021 1929   BASOSABS 0.2 03/07/2020 1532    Recent Results (from the past 240 hour(s))  Resp Panel by RT-PCR (Flu A&B, Covid) Nasopharyngeal Swab  Status: None   Collection Time: 06/02/21  8:27 PM   Specimen: Nasopharyngeal Swab; Nasopharyngeal(NP) swabs in vial transport medium  Result Value Ref Range Status   SARS Coronavirus 2 by RT PCR NEGATIVE NEGATIVE Final    Comment: (NOTE) SARS-CoV-2 target nucleic acids are NOT DETECTED.  The SARS-CoV-2 RNA is generally detectable in upper respiratory specimens during the acute phase of infection. The lowest concentration of SARS-CoV-2 viral copies this assay can detect is 138 copies/mL. A negative result does not preclude  SARS-Cov-2 infection and should not be used as the sole basis for treatment or other patient management decisions. A negative result may occur with  improper specimen collection/handling, submission of specimen other than nasopharyngeal swab, presence of viral mutation(s) within the areas targeted by this assay, and inadequate number of viral copies(<138 copies/mL). A negative result must be combined with clinical observations, patient history, and epidemiological information. The expected result is Negative.  Fact Sheet for Patients:  BloggerCourse.com  Fact Sheet for Healthcare Providers:  SeriousBroker.it  This test is no t yet approved or cleared by the [place] FDA and  has been authorized for detection and/or diagnosis of SARS-CoV-2 by FDA under an Emergency Use Authorization (EUA). This EUA will remain  in effect (meaning this test can be used) for the duration of the COVID-19 declaration under Section 564(b)(1) of the Act, 21 U.S.C.section 360bbb-3(b)(1), unless the authorization is terminated  or revoked sooner.       Influenza A by PCR NEGATIVE NEGATIVE Final   Influenza B by PCR NEGATIVE NEGATIVE Final    Comment: (NOTE) The Xpert Xpress SARS-CoV-2/FLU/RSV plus assay is intended as an aid in the diagnosis of influenza from Nasopharyngeal swab specimens and should not be used as a sole basis for treatment. Nasal washings and aspirates are unacceptable for Xpert Xpress SARS-CoV-2/FLU/RSV testing.  Fact Sheet for Patients: BloggerCourse.com  Fact Sheet for Healthcare Providers: SeriousBroker.it  This test is not yet approved or cleared by the [place] FDA and has been authorized for detection and/or diagnosis of SARS-CoV-2 by FDA under an Emergency Use Authorization (EUA). This EUA will remain in effect (meaning this test can be used) for the duration of  the COVID-19 declaration under Section 564(b)(1) of the Act, 21 U.S.C. section 360bbb-3(b)(1), unless the authorization is terminated or revoked.  Performed at [facility], [address] [place], [place] [address]     Studies/Results: DG Chest 2 View  Result Date: 06/02/2021 CLINICAL DATA:  Chest pain EXAM: CHEST - 2 VIEW COMPARISON:  08/06/2020 FINDINGS: The heart size and mediastinal contours are within normal limits. Both lungs are clear. The visualized skeletal structures are unremarkable. IMPRESSION: No active cardiopulmonary disease. Electronically Signed   By: Jasmine Pang M.D.   On: 06/02/2021 19:58   US Abdomen Limited  Result Date: 06/02/2021 CLINICAL DATA:  Right upper quadrant pain for 2 weeks. EXAM: ULTRASOUND ABDOMEN LIMITED RIGHT UPPER QUADRANT COMPARISON:  CT 05/15/2021 FINDINGS: Gallbladder: Cholelithiasis with multiple stones in the dependent gallbladder. Largest measures about 5 mm. Layering sludge is also present. No gallbladder wall thickening or edema. Murphy's sign is negative. Common bile duct: Diameter: 3 mm, normal Liver: No focal lesion identified. Within normal limits in parenchymal echogenicity. Portal vein is patent on color Doppler imaging with normal direction of blood flow towards the liver. Other: None. IMPRESSION: Cholelithiasis and gallbladder sludge. No additional changes to suggest cholecystitis. Electronically Signed   By: Burman Nieves M.D.   On: 06/02/2021 21:17  Medications: Scheduled Meds:  citalopram  20 mg Oral Daily   enoxaparin (LOVENOX) injection  40 mg Subcutaneous Q24H   ketorolac  15 mg Intravenous Q6H   lidocaine  1 patch Transdermal Q24H   loratadine  10 mg Oral QPM   montelukast  10 mg Oral QHS   pantoprazole (PROTONIX) IV  40 mg Intravenous Q12H   Continuous Infusions:  sodium chloride 100 mL/hr at 06/03/21 1658   PRN Meds:.acetaminophen **OR** acetaminophen, HYDROmorphone (DILAUDID) injection,  magnesium hydroxide, naloxone, ondansetron, oxyCODONE, promethazine, tiZANidine, traZODone  Consultants: General surgery  Procedures: Laparoscopic cholecystectomy  Antibiotics: IV Cipro  Assessment/Plan: Active Problems:   Cholelithiasis  Acute cholecystitis with cholelithiasis: Patient underwent cholecystectomy per Dr. Magnus Ivan, general surgery without complication. Continue pain control Will defer to general surgery for follow-up  Sickle cell disease with pain crisis: Discontinued normal saline.   Continue IV fluids, 0.45% saline at 100 mL/h Initiate Toradol 15 mg IV every 6 hours Oxycodone 15 mg every 4 hours as needed Dilaudid 1 mg every 6 hours as needed for severe breakthrough pain  Polysubstance abuse: Reviewed urine drug screen.  Positive for cocaine and opiates.  Patient counseled, surprised that urine is positive for cocaine.  She says that she does not need any rehabilitation at this time.  We will refrain from IV Dilaudid PCA  Chronic pain syndrome: Continue home medications  History of depression: Continue Celexa.  Patient denies any suicidal or homicidal intent  Seasonal allergies: Continue home medications   Code Status: Full Code Family Communication: N/A Disposition Plan: Not yet ready for discharge  Lacrecia Delval Rennis Petty  APRN, MSN, FNP-C [facility] [address] [place], [place] [address] [phone]  If 5PM-8AM, please contact night-coverage.  06/03/2021, 5:28 PM  LOS: 1 day

## 2021-06-03 NOTE — ED Notes (Signed)
Pt continued to eat crackers and soda despite instructions not to per MD order stating NPO after Midnight.  Pt verbalized understanding, however continued to eat.

## 2021-06-03 NOTE — Anesthesia Procedure Notes (Signed)
Procedure Name: Intubation Date/Time: 06/03/2021 12:09 PM Performed by: Pearson Grippe, CRNA Pre-anesthesia Checklist: Patient identified, Emergency Drugs available, Suction available and Patient being monitored Patient Re-evaluated:Patient Re-evaluated prior to induction Oxygen Delivery Method: Circle system utilized Preoxygenation: Pre-oxygenation with 100% oxygen Induction Type: IV induction Ventilation: Mask ventilation without difficulty Laryngoscope Size: Miller and 2 Grade View: Grade I Tube type: Oral Tube size: 7.0 mm Number of attempts: 1 Airway Equipment and Method: Stylet and Oral airway Placement Confirmation: ETT inserted through vocal cords under direct vision, positive ETCO2 and breath sounds checked- equal and bilateral Secured at: 21 cm Tube secured with: Tape Dental Injury: Teeth and Oropharynx as per pre-operative assessment

## 2021-06-04 ENCOUNTER — Encounter (HOSPITAL_COMMUNITY): Payer: Self-pay | Admitting: Surgery

## 2021-06-04 DIAGNOSIS — F1490 Cocaine use, unspecified, uncomplicated: Secondary | ICD-10-CM

## 2021-06-04 DIAGNOSIS — G894 Chronic pain syndrome: Secondary | ICD-10-CM

## 2021-06-04 DIAGNOSIS — D638 Anemia in other chronic diseases classified elsewhere: Secondary | ICD-10-CM

## 2021-06-04 LAB — CBC WITH DIFFERENTIAL/PLATELET
Abs Immature Granulocytes: 0.07 10*3/uL (ref 0.00–0.07)
Basophils Absolute: 0 10*3/uL (ref 0.0–0.1)
Basophils Relative: 0 %
Eosinophils Absolute: 0 10*3/uL (ref 0.0–0.5)
Eosinophils Relative: 0 %
HCT: 25.2 % — ABNORMAL LOW (ref 36.0–46.0)
Hemoglobin: 9.2 g/dL — ABNORMAL LOW (ref 12.0–15.0)
Immature Granulocytes: 1 %
Lymphocytes Relative: 17 %
Lymphs Abs: 2.2 10*3/uL (ref 0.7–4.0)
MCH: 26.4 pg (ref 26.0–34.0)
MCHC: 36.5 g/dL — ABNORMAL HIGH (ref 30.0–36.0)
MCV: 72.4 fL — ABNORMAL LOW (ref 80.0–100.0)
Monocytes Absolute: 1.7 10*3/uL — ABNORMAL HIGH (ref 0.1–1.0)
Monocytes Relative: 13 %
Neutro Abs: 9.1 10*3/uL — ABNORMAL HIGH (ref 1.7–7.7)
Neutrophils Relative %: 69 %
Platelets: 441 10*3/uL — ABNORMAL HIGH (ref 150–400)
RBC: 3.48 MIL/uL — ABNORMAL LOW (ref 3.87–5.11)
RDW: 15.7 % — ABNORMAL HIGH (ref 11.5–15.5)
WBC: 13.2 10*3/uL — ABNORMAL HIGH (ref 4.0–10.5)
nRBC: 3 % — ABNORMAL HIGH (ref 0.0–0.2)

## 2021-06-04 LAB — BASIC METABOLIC PANEL
Anion gap: 6 (ref 5–15)
BUN: <5 mg/dL — ABNORMAL LOW (ref 6–20)
CO2: 27 mmol/L (ref 22–32)
Calcium: 8.7 mg/dL — ABNORMAL LOW (ref 8.9–10.3)
Chloride: 102 mmol/L (ref 98–111)
Creatinine, Ser: 0.61 mg/dL (ref 0.44–1.00)
GFR, Estimated: >60 mL/min (ref >60)
Glucose, Bld: 116 mg/dL — ABNORMAL HIGH (ref 70–99)
Potassium: 3.3 mmol/L — ABNORMAL LOW (ref 3.5–5.1)
Sodium: 135 mmol/L (ref 135–145)

## 2021-06-04 LAB — SURGICAL PATHOLOGY

## 2021-06-04 MED ORDER — POTASSIUM CHLORIDE CRYS ER 20 MEQ PO TBCR
20.0000 meq | EXTENDED_RELEASE_TABLET | Freq: Every day | ORAL | Status: DC
  Administered 2021-06-04 – 2021-06-05 (×2): 20 meq via ORAL
  Filled 2021-06-04 (×2): qty 1

## 2021-06-04 MED ORDER — PANTOPRAZOLE SODIUM 40 MG PO TBEC
40.0000 mg | DELAYED_RELEASE_TABLET | Freq: Two times a day (BID) | ORAL | Status: DC
  Administered 2021-06-04 – 2021-06-05 (×2): 40 mg via ORAL
  Filled 2021-06-04 (×2): qty 1

## 2021-06-04 NOTE — Progress Notes (Signed)
1 Day Post-Op  Subjective: Patient appears very comfortable this morning and in no acute pain.  Abdominal pain is improved from yesterday.  She has tolerated CLD and getting ready to eat a bagel sandwich  ROS: See above, otherwise other systems negative  Objective: Vital signs in last 24 hours: Temp:  [97.5 F (36.4 C)-98.9 F (37.2 C)] 98.3 F (36.8 C) (06/16 0431) Pulse Rate:  [50-79] 78 (06/16 0431) Resp:  [9-20] 14 (06/16 0431) BP: (109-155)/(64-109) 114/68 (06/16 0431) SpO2:  [95 %-100 %] 100 % (06/16 0431) FiO2 (%):  [16 %] 16 % (06/15 2139)    Intake/Output from previous day: 06/15 0701 - 06/16 0700 In: 2802.5 [I.V.:2802.5] Out: 5 [Blood:5] Intake/Output this shift: Total I/O In: 471 [P.O.:471] Out: -   PE: Abd: soft, appropriately tender, +BS, ND, incisions C/D/I  Lab Results:  Recent Labs    06/03/21 0500 06/04/21 0521  WBC 11.5* 13.2*  HGB 9.5* 9.2*  HCT 26.3* 25.2*  PLT 424* 441*   BMET Recent Labs    06/03/21 0500 06/04/21 0521  NA 137 135  K 3.4* 3.3*  CL 103 102  CO2 28 27  GLUCOSE 79 116*  BUN 6 <5*  CREATININE 0.71 0.61  CALCIUM 8.6* 8.7*   PT/INR No results for input(s): LABPROT, INR in the last 72 hours. CMP     Component Value Date/Time   NA 135 06/04/2021 0521   NA 138 03/07/2020 1532   K 3.3 (L) 06/04/2021 0521   CL 102 06/04/2021 0521   CO2 27 06/04/2021 0521   GLUCOSE 116 (H) 06/04/2021 0521   BUN <5 (L) 06/04/2021 0521   BUN 6 03/07/2020 1532   CREATININE 0.61 06/04/2021 0521   CREATININE 0.69 08/04/2017 1208   CALCIUM 8.7 (L) 06/04/2021 0521   PROT 6.9 06/03/2021 0500   PROT 6.3 03/07/2020 1532   ALBUMIN 3.4 (L) 06/03/2021 0500   ALBUMIN 4.0 03/07/2020 1532   AST 19 06/03/2021 0500   ALT 11 06/03/2021 0500   ALKPHOS 42 06/03/2021 0500   BILITOT 0.6 06/03/2021 0500   BILITOT 0.7 03/07/2020 1532   GFRNONAA >60 06/04/2021 0521   GFRNONAA >89 08/04/2017 1208   GFRAA >60 09/23/2020 0658   GFRAA >89 08/04/2017  1208   Lipase     Component Value Date/Time   LIPASE 38 06/02/2021 1932       Studies/Results: DG Chest 2 View  Result Date: 06/02/2021 CLINICAL DATA:  Chest pain EXAM: CHEST - 2 VIEW COMPARISON:  08/06/2020 FINDINGS: The heart size and mediastinal contours are within normal limits. Both lungs are clear. The visualized skeletal structures are unremarkable. IMPRESSION: No active cardiopulmonary disease. Electronically Signed   By: Jasmine Pang M.D.   On: 06/02/2021 19:58   US Abdomen Limited  Result Date: 06/02/2021 CLINICAL DATA:  Right upper quadrant pain for 2 weeks. EXAM: ULTRASOUND ABDOMEN LIMITED RIGHT UPPER QUADRANT COMPARISON:  CT 05/15/2021 FINDINGS: Gallbladder: Cholelithiasis with multiple stones in the dependent gallbladder. Largest measures about 5 mm. Layering sludge is also present. No gallbladder wall thickening or edema. Murphy's sign is negative. Common bile duct: Diameter: 3 mm, normal Liver: No focal lesion identified. Within normal limits in parenchymal echogenicity. Portal vein is patent on color Doppler imaging with normal direction of blood flow towards the liver. Other: None. IMPRESSION: Cholelithiasis and gallbladder sludge. No additional changes to suggest cholecystitis. Electronically Signed   By: Burman Nieves M.D.   On: 06/02/2021 21:17    Anti-infectives: Anti-infectives (  From admission, onward)    Start     Dose/Rate Route Frequency Ordered Stop   06/03/21 0730  vancomycin (VANCOCIN) IVPB 1000 mg/200 mL premix       Note to Pharmacy: Ok to adjust dose as needed   1,000 mg 200 mL/hr over 60 Minutes Intravenous On call to O.R. 06/03/21 0715 06/03/21 1207   06/02/21 2200  ciprofloxacin (CIPRO) IVPB 400 mg        400 mg 200 mL/hr over 60 Minutes Intravenous  Once 06/02/21 2157 06/03/21 0026        Assessment/Plan POD 1, s/p lap chole by Dr. Magnus Ivan -doing well today -tolerating a diet -pain appears well controlled -surgically stable for DC home  today from our standpoint -no further narcotics will be provided as she has some already at home -follow up arranged. -I have relayed to the primary service that she is surgically stable for DC home.   FEN - regular diet VTE - Lovenox ID - none  SSA - per medicine   LOS: 2 days    Letha Cape , [facility] Surgery 06/04/2021, 11:22 AM Please see Amion for pager number during day hours 7:00am-4:30pm or 7:00am -11:30am on weekends

## 2021-06-04 NOTE — Progress Notes (Signed)
Subjective: Hannah Hawkins is a 35 year old female with a medical history significant for sickle cell disease, chronic pain syndrome, opiate dependence and tolerance, history of depression, and history of polysubstance abuse was admitted for acute cholecystitis with cholelithiasis in the setting of sickle cell crisis.  Patient underwent cholecystectomy under general anesthesia on 06/03/2021 without complication.  Patient complains of pain primarily to central chest.  Pain intensity is rated as 8/10.   She denies any headache, chest pain, shortness of breath, nausea or vomiting, or diarrhea.  Patient is currently very sleepy.  She reports that she is tolerating her diet.  Objective:  Vital signs in last 24 hours:  Vitals:   06/03/21 2139 06/04/21 0138 06/04/21 0431 06/04/21 1336  BP: (!) 146/93 109/64 114/68 (!) 98/56  Pulse: 69 79 78 67  Resp: 16 14 14 15   Temp: 98.4 F (36.9 C) 98.4 F (36.9 C) 98.3 F (36.8 C) 98 F (36.7 C)  TempSrc: Oral Oral Oral Oral  SpO2: 100% 99% 100% 99%  Weight:      Height:        Intake/Output from previous day:   Intake/Output Summary (Last 24 hours) at 06/04/2021 1436 Last data filed at 06/04/2021 0900 Gross per 24 hour  Intake 2273.5 ml  Output --  Net 2273.5 ml    Physical Exam: General: Alert, awake, oriented x3, in no acute distress.  Sleepy. HEENT: [place]/AT [place]ERL, EOMI Neck: Trachea midline,  no masses, no thyromegal,y no JVD, no carotid bruit OROPHARYNX:  Moist, No exudate/ erythema/lesions.  Heart: Regular rate and rhythm, without murmurs, rubs, gallops, PMI non-displaced, no heaves or thrills on palpation.  Lungs: Clear to auscultation, no wheezing or rhonchi noted. No increased vocal fremitus resonant to percussion  Abdomen: Tender, Dermabond intact to RUQ, no erythema.  No drainage.  Positive bowel sounds Neuro: No focal neurological deficits noted cranial nerves II through XII grossly intact. DTRs 2+ bilaterally upper and lower  extremities. Strength 5 out of 5 in bilateral upper and lower extremities. Musculoskeletal: No warm swelling or erythema around joints, no spinal tenderness noted. Psychiatric: Patient alert and oriented x3, good insight and cognition, good recent to remote recall. Lymph node survey: No cervical axillary or inguinal lymphadenopathy noted.  Lab Results:  Basic Metabolic Panel:    Component Value Date/Time   NA 135 06/04/2021 0521   NA 138 03/07/2020 1532   K 3.3 (L) 06/04/2021 0521   CL 102 06/04/2021 0521   CO2 27 06/04/2021 0521   BUN <5 (L) 06/04/2021 0521   BUN 6 03/07/2020 1532   CREATININE 0.61 06/04/2021 0521   CREATININE 0.69 08/04/2017 1208   GLUCOSE 116 (H) 06/04/2021 0521   CALCIUM 8.7 (L) 06/04/2021 0521   CBC:    Component Value Date/Time   WBC 13.2 (H) 06/04/2021 0521   HGB 9.2 (L) 06/04/2021 0521   HGB 9.9 (L) 03/07/2020 1532   HCT 25.2 (L) 06/04/2021 0521   HCT 28.7 (L) 03/07/2020 1532   PLT 441 (H) 06/04/2021 0521   PLT 311 03/07/2020 1532   MCV 72.4 (L) 06/04/2021 0521   MCV 89 03/07/2020 1532   NEUTROABS 9.1 (H) 06/04/2021 0521   NEUTROABS 4.7 03/07/2020 1532   LYMPHSABS 2.2 06/04/2021 0521   LYMPHSABS 5.0 (H) 03/07/2020 1532   MONOABS 1.7 (H) 06/04/2021 0521   EOSABS 0.0 06/04/2021 0521   EOSABS 0.7 (H) 03/07/2020 1532   BASOSABS 0.0 06/04/2021 0521   BASOSABS 0.2 03/07/2020 1532    Recent Results (from  the past 240 hour(s))  Resp Panel by RT-PCR (Flu A&B, Covid) Nasopharyngeal Swab     Status: None   Collection Time: 06/02/21  8:27 PM   Specimen: Nasopharyngeal Swab; Nasopharyngeal(NP) swabs in vial transport medium  Result Value Ref Range Status   SARS Coronavirus 2 by RT PCR NEGATIVE NEGATIVE Final    Comment: (NOTE) SARS-CoV-2 target nucleic acids are NOT DETECTED.  The SARS-CoV-2 RNA is generally detectable in upper respiratory specimens during the acute phase of infection. The lowest concentration of SARS-CoV-2 viral copies this assay  can detect is 138 copies/mL. A negative result does not preclude SARS-Cov-2 infection and should not be used as the sole basis for treatment or other patient management decisions. A negative result may occur with  improper specimen collection/handling, submission of specimen other than nasopharyngeal swab, presence of viral mutation(s) within the areas targeted by this assay, and inadequate number of viral copies(<138 copies/mL). A negative result must be combined with clinical observations, patient history, and epidemiological information. The expected result is Negative.  Fact Sheet for Patients:  BloggerCourse.com  Fact Sheet for Healthcare Providers:  SeriousBroker.it  This test is no t yet approved or cleared by the [place] FDA and  has been authorized for detection and/or diagnosis of SARS-CoV-2 by FDA under an Emergency Use Authorization (EUA). This EUA will remain  in effect (meaning this test can be used) for the duration of the COVID-19 declaration under Section 564(b)(1) of the Act, 21 U.S.C.section 360bbb-3(b)(1), unless the authorization is terminated  or revoked sooner.       Influenza A by PCR NEGATIVE NEGATIVE Final   Influenza B by PCR NEGATIVE NEGATIVE Final    Comment: (NOTE) The Xpert Xpress SARS-CoV-2/FLU/RSV plus assay is intended as an aid in the diagnosis of influenza from Nasopharyngeal swab specimens and should not be used as a sole basis for treatment. Nasal washings and aspirates are unacceptable for Xpert Xpress SARS-CoV-2/FLU/RSV testing.  Fact Sheet for Patients: BloggerCourse.com  Fact Sheet for Healthcare Providers: SeriousBroker.it  This test is not yet approved or cleared by the [place] FDA and has been authorized for detection and/or diagnosis of SARS-CoV-2 by FDA under an Emergency Use Authorization (EUA). This EUA will  remain in effect (meaning this test can be used) for the duration of the COVID-19 declaration under Section 564(b)(1) of the Act, 21 U.S.C. section 360bbb-3(b)(1), unless the authorization is terminated or revoked.  Performed at [facility], [address] [place], [place] [address]     Studies/Results: DG Chest 2 View  Result Date: 06/02/2021 CLINICAL DATA:  Chest pain EXAM: CHEST - 2 VIEW COMPARISON:  08/06/2020 FINDINGS: The heart size and mediastinal contours are within normal limits. Both lungs are clear. The visualized skeletal structures are unremarkable. IMPRESSION: No active cardiopulmonary disease. Electronically Signed   By: Jasmine Pang M.D.   On: 06/02/2021 19:58   US Abdomen Limited  Result Date: 06/02/2021 CLINICAL DATA:  Right upper quadrant pain for 2 weeks. EXAM: ULTRASOUND ABDOMEN LIMITED RIGHT UPPER QUADRANT COMPARISON:  CT 05/15/2021 FINDINGS: Gallbladder: Cholelithiasis with multiple stones in the dependent gallbladder. Largest measures about 5 mm. Layering sludge is also present. No gallbladder wall thickening or edema. Murphy's sign is negative. Common bile duct: Diameter: 3 mm, normal Liver: No focal lesion identified. Within normal limits in parenchymal echogenicity. Portal vein is patent on color Doppler imaging with normal direction of blood flow towards the liver. Other: None. IMPRESSION: Cholelithiasis and gallbladder sludge. No additional  changes to suggest cholecystitis. Electronically Signed   By: Burman Nieves M.D.   On: 06/02/2021 21:17    Medications: Scheduled Meds:  citalopram  20 mg Oral Daily   enoxaparin (LOVENOX) injection  40 mg Subcutaneous Q24H   ketorolac  15 mg Intravenous Q6H   lidocaine  1 patch Transdermal Q24H   loratadine  10 mg Oral QPM   montelukast  10 mg Oral QHS   pantoprazole  40 mg Oral BID   potassium chloride SA  20 mEq Oral Daily   Continuous Infusions:  sodium chloride 100 mL/hr at 06/03/21 1658    PRN Meds:.acetaminophen **OR** acetaminophen, magnesium hydroxide, naloxone, ondansetron, oxyCODONE, promethazine, tiZANidine, traZODone  Consultants: General surgery  Procedures: Laparoscopic cholecystectomy  Antibiotics: IV Cipro  Assessment/Plan: Principal Problem:   Cholelithiasis Active Problems:   Sickle cell pain crisis (HCC)   Anemia of chronic disease   Cocaine use   Chronic pain syndrome  Acute cholecystitis with cholelithiasis: Patient underwent cholecystectomy per Dr. Magnus Ivan, general surgery without complication.  Patient has been cleared from a surgical standpoint.  She will follow-up with her surgeon as scheduled.  Sickle cell disease with pain crisis: Reduce IV fluids to [facility] Initiate Toradol 15 mg IV every 6 hours Oxycodone 15 mg every 4 hours as needed Discontinue IV Dilaudid  Polysubstance abuse: Reviewed urine drug screen.  Positive for cocaine and opiates.  Patient counseled, surprised that urine is positive for cocaine.  She says that she does not need any rehabilitation at this time.  We will refrain from IV Dilaudid PCA  Chronic pain syndrome: Continue home medications  History of depression: Continue Celexa.  Patient denies any suicidal or homicidal intent  Seasonal allergies: Continue home medications   Code Status: Full Code Family Communication: N/A Disposition Plan: Not yet ready for discharge.  Discharge planned for 06/05/2021  Nolon Nations  APRN, MSN, FNP-C [facility] [address] [place], [place] [address] [phone]  If 5PM-8AM, please contact night-coverage.  06/04/2021, 2:36 PM  LOS: 2 days

## 2021-06-05 NOTE — Progress Notes (Signed)
Reviewed d/c instructions w pt and all questions answered. Pt verbalized understanding. D/C per w/c w all belongings in stable condition.

## 2021-06-05 NOTE — TOC Transition Note (Signed)
Transition of Care [facility]) - CM/SW Discharge Note   Patient Details  Name: Hannah Hawkins MRN: [id number] Date of Birth: 03/09/1986  Transition of Care [facility]) CM/SW Contact:  Golda Acre, RN Phone Number: 06/05/2021, 2:43 PM   Clinical Narrative:    Discharged to home with no needs.         Patient Goals and CMS Choice        Discharge Placement                       Discharge Plan and Services                                     Social Determinants of Health (SDOH) Interventions     Readmission Risk Interventions Readmission Risk Prevention Plan 08/08/2020  PCP or Specialist appointment within 3-5 days of discharge Not Complete  PCP/Specialist Appt Not Complete comments First available was 08/28/20  Some recent data might be hidden

## 2021-06-05 NOTE — Discharge Summary (Signed)
Physician Discharge Summary  Hannah Hawkins WUJ:[id number] DOB: 1986-10-28 DOA: 06/02/2021  PCP: Vevelyn Francois, NP  Admit date: 06/02/2021  Discharge date: 06/05/2021  Discharge Diagnoses:  Principal Problem:   Cholelithiasis Active Problems:   Sickle cell pain crisis ([place])   Anemia of chronic disease   Cocaine use   Chronic pain syndrome   Discharge Condition: Stable  Disposition:   Follow-up Information     [facility], [facility] [place] Follow up on 06/25/2021.   Specialty: General Surgery Why: 06/25/21 at 3 pm. Please arrive 30 minutes prior to your appointment for paperwork and check in process Contact information: [place] [address] [phone]                Pt is discharged home in good condition and is to follow up with Vevelyn Francois, NP this week to have labs evaluated. Hannah Hawkins is instructed to increase activity slowly and balance with rest for the next few days, and use prescribed medication to complete treatment of pain  Diet: Regular Wt Readings from Last 3 Encounters:  06/03/21 63 kg  04/16/21 63 kg  03/17/21 67.1 kg    History of present illness:  Hannah Hawkins is a 35 year old female with a medical history significant for sickle cell disease, anemia of chronic disease, opiate dependence and tolerance, history of polysubstance abuse that presented to the emergency department with acute onset right upper quadrant abdominal pain, which has been intermittent over the last couple of weeks.  Feels like a colicky pain and occasional burning with exacerbation when she is not eating.  She admitted to nausea and vomiting once, but denied any diarrhea.  No tenderness, fever, or chills.  He has had an occasional cough without wheezing or dyspnea.  No chest pain or palpitations.  No dysuria, oliguria, hematuria, or flank pain.  ED course: Patient came into the ER, vital signs were within normal limits.  Labs revealed  hypokalemia at 3.2, otherwise unremarkable CMP.  CBC showed leukocytosis of 11.6 and mild thrombocytosis of 444,000 with associated anemia.  Quantitative hCG was less than 5.  COVID-19 PCR came back negative.  ECG normal sinus rhythm.  Abdominal ultrasound of right upper quadrant revealed cholelithiasis and gallbladder sludge with no signs of cholecystitis.  X-ray showed no acute cardiopulmonary process.  Patient received IV ciprofloxacin 400 mg, and Benadryl 25 mg IV.  Also, 2 mg of IV Dilaudid and 4 mg of IV Zofran and hydration with half-normal saline at 150 mL/h.  Dr. Windle Guard with general surgery was notified about patient concerning cholelithiasis.  Patient admitted to medical bed for further evaluation and management.  Hospital Course:  Cholecystitis with cholelithiasis: Patient presented to the emergency room with right upper quadrant pain over the past several weeks and was found to have gallbladder sludge and cholelithiasis on ultrasound.  Patient underwent cholecystectomy by Dr. Ninfa Linden general surgery on 06/18/5283 without complication.  Patient is scheduled to follow-up with surgery team and is aware of her appointment.  Patient continues to have some mild abdominal discomfort.  Dermabond intact.  No signs of infection.  Patient tolerating diet well.  Sickle cell pain: Patient has chronic pain related to her sickle cell disease.  Oxycodone 15 mg every 4 hours was continued throughout admission.  Held IV Dilaudid, due to illicit substance use.  Patient will resume her home medications.  Counseled at length about the dangers of opiate use along with illicit drug use.  Patient expressed understanding.  Polysubstance abuse: Patient has a long history of polysubstance abuse.  Reviewed UDS, positive for cocaine and opiates.  Patient counseled at length.  She continues to refuse information pertaining to drug rehabilitation.  Patient advised to follow-up with PCP within 1 week for labs.  Prior to  discharge, all laboratory values are largely consistent with patient's baseline.  She is afebrile without any signs of infection.  Patient was therefore discharged home today in a hemodynamically stable condition.   Hannah Hawkins will follow-up with PCP within 1 week of this discharge. Hannah Hawkins was counseled extensively about nonpharmacologic means of pain management, patient verbalized understanding and was appreciative of  the care received during this admission.   We discussed the need for good hydration, monitoring of hydration status, avoidance of heat, cold, stress, and infection triggers. We discussed the need to be adherent with taking home medications. Patient was reminded of the need to seek medical attention immediately if any symptom of bleeding, anemia, or infection occurs.  Discharge Exam: Vitals:   06/04/21 2050 06/05/21 0456  BP: (!) 108/58 111/70  Pulse: 69 63  Resp: 14 14  Temp: 98.8 F (37.1 C) 98 F (36.7 C)  SpO2: 98% 96%   Vitals:   06/04/21 0431 06/04/21 1336 06/04/21 2050 06/05/21 0456  BP: 114/68 (!) 98/56 (!) 108/58 111/70  Pulse: 78 67 69 63  Resp: '14 15 14 14  ' Temp: 98.3 F (36.8 C) 98 F (36.7 C) 98.8 F (37.1 C) 98 F (36.7 C)  TempSrc: Oral Oral Oral Oral  SpO2: 100% 99% 98% 96%  Weight:      Height:        General appearance : Awake, alert, not in any distress. Speech Clear. Not toxic looking HEENT: Atraumatic and Normocephalic, pupils equally reactive to light and accomodation Neck: Supple, no JVD. No cervical lymphadenopathy.  Chest: Good air entry bilaterally, no added sounds  CVS: S1 S2 regular, no murmurs.  Abdomen: Bowel sounds present, Non tender and not distended with no guarding, rigidity or rebound. Extremities: B/L Lower Ext shows no edema, both legs are warm to touch Neurology: Awake alert, and oriented X 3, CN II-XII intact, Non focal Skin: No Rash  Discharge Instructions   Allergies as of 06/05/2021       Reactions    Penicillins Swelling, Rash   Has patient had a PCN reaction causing immediate rash, facial/tongue/throat swelling, SOB or lightheadedness with hypotension: Yes Has patient had a PCN reaction causing severe rash involving mucus membranes or skin necrosis: Yes Has patient had a PCN reaction that required hospitalization No Has patient had a PCN reaction occurring within the last 10 years: no If all of the above answers are "NO", then may proceed with Cephalosporin use.        Medication List     TAKE these medications    citalopram 20 MG tablet Commonly known as: CeleXA Take 1 tablet (20 mg total) by mouth daily.   ibuprofen 800 MG tablet Commonly known as: ADVIL Take 1 tablet (800 mg total) by mouth 3 (three) times daily.   levocetirizine 5 MG tablet Commonly known as: XYZAL Take 5 mg by mouth every evening.   montelukast 10 MG tablet Commonly known as: SINGULAIR Take 1 tablet (10 mg total) by mouth at bedtime.   Narcan 4 MG/0.1ML Liqd nasal spray kit Generic drug: naloxone Place 4 mg into the nose once as needed (overdose).   oxyCODONE 15 MG immediate release tablet Commonly known as: ROXICODONE Take  1 tablet (15 mg total) by mouth every 6 (six) hours as needed for up to 15 days for pain (moderate to severe pain).   promethazine 12.5 MG tablet Commonly known as: PHENERGAN Take 1 tablet (12.5 mg total) by mouth every 4 (four) hours as needed for nausea or vomiting.   tiZANidine 2 MG tablet Commonly known as: ZANAFLEX Take 1 tablet (2 mg total) by mouth every 6 (six) hours as needed for muscle spasms.       ASK your doctor about these medications    lidocaine 5 % Commonly known as: Lidoderm Place 1 patch onto the skin daily. Remove & Discard patch within 12 hours or as directed by MD        The results of significant diagnostics from this hospitalization (including imaging, microbiology, ancillary and laboratory) are listed below for reference.     Significant Diagnostic Studies: CT Abdomen Pelvis Wo Contrast  Result Date: 05/15/2021 CLINICAL DATA:  35 year old female with abdominal pain. History of sickle cell. EXAM: CT ABDOMEN AND PELVIS WITHOUT CONTRAST TECHNIQUE: Multidetector CT imaging of the abdomen and pelvis was performed following the standard protocol without IV contrast. COMPARISON:  CT abdomen pelvis dated 08/06/2020. FINDINGS: Evaluation of this exam is limited in the absence of intravenous contrast. Lower chest: The visualized lung bases are clear. No intra-abdominal free air or free fluid. Hepatobiliary: The liver is unremarkable. No intrahepatic biliary dilatation. Small amount of layering sludge or small stones within the gallbladder. No pericholecystic fluid or evidence of acute cholecystitis by CT. Pancreas: Unremarkable. No pancreatic ductal dilatation or surrounding inflammatory changes. 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 Adrenals/Urinary Tract: The adrenal glands unremarkable. There is no hydronephrosis or nephrolithiasis on either side. The visualized ureters and urinary bladder are unremarkable. Stomach/Bowel: There is no bowel obstruction or active inflammation. The appendix is normal. Vascular/Lymphatic: The abdominal aorta and IVC are grossly unremarkable on this noncontrast CT. No portal venous gas. There is no adenopathy. Reproductive: The uterus is anteverted and grossly unremarkable. No adnexal masses. Other: None Musculoskeletal: No acute or significant osseous findings. IMPRESSION: 1. No acute intra-abdominal or pelvic pathology. No bowel obstruction. Normal appendix. 2. Small amount of layering sludge or small stones within the gallbladder. No evidence of acute cholecystitis by CT. Electronically Signed   By: Anner Crete M.D.   On: 05/15/2021 20:59   DG Chest 2 View  Result Date: 06/02/2021 CLINICAL DATA:  Chest pain EXAM: CHEST - 2 VIEW COMPARISON:  08/06/2020 FINDINGS: The heart  size and mediastinal contours are within normal limits. Both lungs are clear. The visualized skeletal structures are unremarkable. IMPRESSION: No active cardiopulmonary disease. Electronically Signed   By: Donavan Foil M.D.   On: 06/02/2021 19:58   US Abdomen Limited  Result Date: 06/02/2021 CLINICAL DATA:  Right upper quadrant pain for 2 weeks. EXAM: ULTRASOUND ABDOMEN LIMITED RIGHT UPPER QUADRANT COMPARISON:  CT 05/15/2021 FINDINGS: Gallbladder: Cholelithiasis with multiple stones in the dependent gallbladder. Largest measures about 5 mm. Layering sludge is also present. No gallbladder wall thickening or edema. Murphy's sign is negative. Common bile duct: Diameter: 3 mm, normal Liver: No focal lesion identified. Within normal limits in parenchymal echogenicity. Portal vein is patent on color Doppler imaging with normal direction of blood flow towards the liver. Other: None. IMPRESSION: Cholelithiasis and gallbladder sludge. No additional changes to suggest cholecystitis. Electronically Signed   By: Lucienne Capers M.D.   On: 06/02/2021 21:17    Microbiology: Recent Results (from the  past 240 hour(s))  Resp Panel by RT-PCR (Flu A&B, Covid) Nasopharyngeal Swab     Status: None   Collection Time: 06/02/21  8:27 PM   Specimen: Nasopharyngeal Swab; Nasopharyngeal(NP) swabs in vial transport medium  Result Value Ref Range Status   SARS Coronavirus 2 by RT PCR NEGATIVE NEGATIVE Final    Comment: (NOTE) SARS-CoV-2 target nucleic acids are NOT DETECTED.  The SARS-CoV-2 RNA is generally detectable in upper respiratory specimens during the acute phase of infection. The lowest concentration of SARS-CoV-2 viral copies this assay can detect is 138 copies/mL. A negative result does not preclude SARS-Cov-2 infection and should not be used as the sole basis for treatment or other patient management decisions. A negative result may occur with  improper specimen collection/handling, submission of specimen  other than nasopharyngeal swab, presence of viral mutation(s) within the areas targeted by this assay, and inadequate number of viral copies(<138 copies/mL). A negative result must be combined with clinical observations, patient history, and epidemiological information. The expected result is Negative.  Fact Sheet for Patients:  EntrepreneurPulse.com.au  Fact Sheet for Healthcare Providers:  IncredibleEmployment.be  This test is no t yet approved or cleared by the [place] FDA and  has been authorized for detection and/or diagnosis of SARS-CoV-2 by FDA under an Emergency Use Authorization (EUA). This EUA will remain  in effect (meaning this test can be used) for the duration of the COVID-19 declaration under Section 564(b)(1) of the Act, 21 U.S.C.section 360bbb-3(b)(1), unless the authorization is terminated  or revoked sooner.       Influenza A by PCR NEGATIVE NEGATIVE Final   Influenza B by PCR NEGATIVE NEGATIVE Final    Comment: (NOTE) The Xpert Xpress SARS-CoV-2/FLU/RSV plus assay is intended as an aid in the diagnosis of influenza from Nasopharyngeal swab specimens and should not be used as a sole basis for treatment. Nasal washings and aspirates are unacceptable for Xpert Xpress SARS-CoV-2/FLU/RSV testing.  Fact Sheet for Patients: EntrepreneurPulse.com.au  Fact Sheet for Healthcare Providers: IncredibleEmployment.be  This test is not yet approved or cleared by the [place] FDA and has been authorized for detection and/or diagnosis of SARS-CoV-2 by FDA under an Emergency Use Authorization (EUA). This EUA will remain in effect (meaning this test can be used) for the duration of the COVID-19 declaration under Section 564(b)(1) of the Act, 21 U.S.C. section 360bbb-3(b)(1), unless the authorization is terminated or revoked.  Performed at [facility], [place] [address] [place], [place] [address]      Labs: Basic Metabolic Panel: Recent Labs  Lab 06/02/21 1919 06/03/21 0500 06/04/21 0521  NA 138 137 135  K 3.2* 3.4* 3.3*  CL 101 103 102  CO2 '26 28 27  ' GLUCOSE 95 79 116*  BUN 7 6 <5*  CREATININE 0.59 0.71 0.61  CALCIUM 8.8* 8.6* 8.7*   Liver Function Tests: Recent Labs  Lab 06/02/21 1932 06/03/21 0500  AST 19 19  ALT 12 11  ALKPHOS 47 42  BILITOT 1.1 0.6  PROT 7.9 6.9  ALBUMIN 4.0 3.4*   Recent Labs  Lab 06/02/21 1932  LIPASE 38   No results for input(s): AMMONIA in the last 168 hours. CBC: Recent Labs  Lab 06/02/21 1919 06/03/21 0500 06/04/21 0521  WBC 11.6* 11.5* 13.2*  NEUTROABS  --   --  9.1*  HGB 10.2* 9.5* 9.2*  HCT 27.4* 26.3* 25.2*  MCV 71.9* 72.7* 72.4*  PLT 444* 424* 441*   Cardiac Enzymes: No results for  input(s): CKTOTAL, CKMB, CKMBINDEX, TROPONINI in the last 168 hours. BNP: Invalid input(s): POCBNP CBG: No results for input(s): GLUCAP in the last 168 hours.  Time coordinating discharge: 35 minutes  Signed:   Donia Pounds  APRN, MSN, FNP-C [facility] [address] [place], [place] [address] [phone]  [facility] 06/05/2021, 12:57 PM

## 2021-06-10 ENCOUNTER — Other Ambulatory Visit: Payer: Self-pay

## 2021-06-10 DIAGNOSIS — D5720 Sickle-cell/Hb-C disease without crisis: Secondary | ICD-10-CM

## 2021-06-10 DIAGNOSIS — G894 Chronic pain syndrome: Secondary | ICD-10-CM

## 2021-06-11 MED ORDER — OXYCODONE HCL 15 MG PO TABS: 15.0000 mg | ORAL_TABLET | Freq: Four times a day (QID) | ORAL | 0 refills | Status: DC | PRN

## 2021-06-17 ENCOUNTER — Ambulatory Visit: Payer: Medicaid Other | Admitting: Nurse Practitioner

## 2021-06-19 ENCOUNTER — Other Ambulatory Visit: Payer: Self-pay

## 2021-06-19 ENCOUNTER — Other Ambulatory Visit: Payer: Medicaid Other

## 2021-06-19 ENCOUNTER — Telehealth (INDEPENDENT_AMBULATORY_CARE_PROVIDER_SITE_OTHER): Payer: Medicaid Other | Admitting: Nurse Practitioner

## 2021-06-19 ENCOUNTER — Encounter: Payer: Self-pay | Admitting: Nurse Practitioner

## 2021-06-19 DIAGNOSIS — G894 Chronic pain syndrome: Secondary | ICD-10-CM

## 2021-06-19 DIAGNOSIS — D571 Sickle-cell disease without crisis: Secondary | ICD-10-CM

## 2021-06-19 DIAGNOSIS — F1490 Cocaine use, unspecified, uncomplicated: Secondary | ICD-10-CM

## 2021-06-19 DIAGNOSIS — R110 Nausea: Secondary | ICD-10-CM

## 2021-06-19 DIAGNOSIS — Z79891 Long term (current) use of opiate analgesic: Secondary | ICD-10-CM | POA: Diagnosis not present

## 2021-06-19 MED ORDER — LIDOCAINE 5 % EX PTCH: 1.0000 | MEDICATED_PATCH | CUTANEOUS | 2 refills | Status: DC

## 2021-06-19 MED ORDER — DULOXETINE HCL 30 MG PO CPEP: 30.0000 mg | ORAL_CAPSULE | Freq: Every day | ORAL | 11 refills | Status: DC

## 2021-06-19 MED ORDER — ONDANSETRON 8 MG PO TBDP: 8.0000 mg | ORAL_TABLET | Freq: Two times a day (BID) | ORAL | 0 refills | Status: AC

## 2021-06-19 NOTE — Patient Instructions (Signed)
Substance Use Disorder Substance use disorder occurs when a person's repeated use of drugs or alcohol interferes with his or her ability to be productive. This disorder can cause problems with mental and physical health. It can affect your ability to have healthy relationships, and it can keep you from being able to meet your responsibilities at work, home, or school. It can also lead to addiction, which is a condition in which the person cannot stop using the substance consistentlyfor a period of time. Addiction changes the way the brain works. Because of these changes, addictionis a chronic condition. Substance use disorder can be mild, moderate, or severe. The most commonly abused substances include: Alcohol. Tobacco. Marijuana. Stimulants, such as cocaine and methamphetamine. Hallucinogens, such as LSD and PCP. Opioids, such as some prescription pain medicines and heroin. What are the causes? This condition may develop due to many complex social, psychological, or physical reasons, such as: Stress. Abuse. Peer pressure. Anxiety or depression. What increases the risk? This condition is more likely to develop in people who: Use substances to cope with stress. Have been abused. Have a mental health disorder, such as depression. Have a family history of substance use disorder. What are the signs or symptoms? Symptoms of this condition include: Using the substance for longer periods of time or at a higher dosage than what is normal or intended. Having a lasting desire to use the substance. Being unable to slow down or stop the use of the substance. Spending an abnormal amount of time getting the substance, using the substance, or recovering from using the substance. Using the substance in a way that interferes with work, school, social activities, and personal relationships. Using the substance even after having negative consequences, such as: Health problems. Legal or financial  troubles. Job loss. Relationship problems. Needing more and more of the substance to get the same effect (developing tolerance). Experiencing unpleasant symptoms if you do not use the substance (withdrawal). Using the substance to avoid withdrawal symptoms. How is this diagnosed? This condition may be diagnosed based on: A physical exam. Your history of substance use. Your symptoms. This includes: How substance use affects your life. Changes in personality, behaviors, and mood. Having at least two symptoms of substance use disorder within a 26-month period. Health issues related to substance use, such as liver damage, shortness of breath, fatigue, cough, or heart problems. Blood or urine tests to screen for alcohol and drugs. How is this treated? This condition may be treated by: Stopping substance use safely. This may require taking medicines and being closely monitored for several days. Taking part in group and individual counseling from mental health providers who help people with substance use disorder. Staying at a live-in (residential) treatment center for several days or weeks. Attending daily counseling sessions at a treatment center. Taking medicine as told by your health care provider: To ease symptoms and prevent complications during withdrawal. To treat other mental health issues, such as depression or anxiety. To block cravings by causing the same effects as the substance. To block the effects of the substance or replace good sensations with unpleasant ones. Participating in a support group to share your experience with others who are going through the same thing. These groups are an important part of long-term recovery for many people. Recovery can be a long process. Many people who undergo treatment start using the substance again after stopping (relapse). If you relapse, that does not mean that treatment will not work. Follow these instructions at home:  Take  over-the-counter and prescription medicines only as told by your health care provider. Do not use any drugs or alcohol. Avoid temptations or triggers that you associate with your use of the substance. Learn and practice techniques for managing stress. Have a plan for vulnerable moments. Get phone numbers of people who are willing to help and who are committed to your recovery. Attend support groups on a regular basis. These groups include 12-step programs like Alcoholics Anonymous and Narcotics Anonymous. Keep all follow-up visits as told by your health care providers. This is important. This includes continuing to work with therapists and support groups. Contact a health care provider if: You cannot take your medicines as told. Your symptoms get worse. You have trouble resisting the urge to use drugs or alcohol. Get help right away if you: Relapse. Think that you may have taken too much of a drug. The hotline of the [facility] is 639-215-9034. Have signs of an overdose. Symptoms include: Chest pain. Confusion. Sleepiness or difficulty staying awake. Slowed breathing. Nausea or vomiting. A seizure. Have serious thoughts about hurting yourself or someone else. Drug overdose is an emergency. Do not wait to see if the symptoms will go away. Get medical help right away. Call your local emergency services (911 in the [place] Do not drive yourself to the hospital. If you ever feel like you may hurt yourself or others, or have thoughts about taking your own life, get help right away. You can go to your nearest emergency department or call: Your local emergency services (911 in the [place] A suicide crisis helpline, such as the National Suicide Prevention Lifeline at 332-024-4366. This is open 24 hours a day. Summary Substance use disorder occurs when a person's repeated use of drugs or alcohol interferes with his or her ability to be productive. Taking part in group and  individual counseling from mental health providers is a common treatment for people with substance use disorder. Recovery can be a long process. Many people who undergo treatment start using the substance again after stopping (relapse). A relapse does not mean that treatment will not work. Attend support groups such as Alcoholics Anonymous and Narcotics Anonymous. These groups are an important part of long-term recovery for many people. This information is not intended to replace advice given to you by your health care provider. Make sure you discuss any questions you have with your healthcare provider. Document Revised: 03/29/2019 Document Reviewed: 01/17/2018 Elsevier Patient Education  2022 [employer]. Sickle Cell Anemia, Adult  Sickle cell anemia is a condition where your red blood cells are shaped like sickles. Red blood cells carry oxygen through the body. Sickle-shaped cells do not live as long as normal red blood cells. They also clump together and block blood from flowing through the blood vessels. This prevents the body from getting enough oxygen. Sickle cell anemia causes organ damage and pain. It alsoincreases the risk of infection. Follow these instructions at home: Medicines Take over-the-counter and prescription medicines only as told by your doctor. If you were prescribed an antibiotic medicine, take it as told by your doctor. Do not stop taking the antibiotic even if you start to feel better. If you develop a fever, do not take medicines to lower the fever right away. Tell your doctor about the fever. Managing pain, stiffness, and swelling Try these methods to help with pain: Use a heating pad. Take a warm bath. Distract yourself, such as by watching TV. Eating and drinking Drink  enough fluid to keep your pee (urine) clear or pale yellow. Drink more in hot weather and during exercise. Limit or avoid alcohol. Eat a healthy diet. Eat plenty of fruits, vegetables, whole grains,  and lean protein. Take vitamins and supplements as told by your doctor. Traveling When traveling, keep these with you: Your medical information. The names of your doctors. Your medicines. If you need to take an airplane, talk to your doctor first. Activity Rest often. Avoid exercises that make your heart beat much faster, such as jogging. General instructions Do not use products that have nicotine or tobacco, such as cigarettes and e-cigarettes. If you need help quitting, ask your doctor. Consider wearing a medical alert bracelet. Avoid being in high places (high altitudes), such as mountains. Avoid very hot or cold temperatures. Avoid places where the temperature changes a lot. Keep all follow-up visits as told by your doctor. This is important. Contact a doctor if: A joint hurts. Your feet or hands hurt or swell. You feel tired (fatigued). Get help right away if: You have symptoms of infection. These include: Fever. Chills. Being very tired. Irritability. Poor eating. Throwing up (vomiting). You feel dizzy or faint. You have new stomach pain, especially on the left side. You have a an erection (priapism) that lasts more than 4 hours. You have numbness in your arms or legs. You have a hard time moving your arms or legs. You have trouble talking. You have pain that does not go away when you take medicine. You are short of breath. You are breathing fast. You have a long-term cough. You have pain in your chest. You have a bad headache. You have a stiff neck. Your stomach looks bloated even though you did not eat much. Your skin is pale. You suddenly cannot see well. Summary Sickle cell anemia is a condition where your red blood cells are shaped like sickles. Follow your doctor's advice on ways to manage pain, food to eat, activities to do, and steps to take for safe travel. Get medical help right away if you have any signs of infection, such as a fever. This  information is not intended to replace advice given to you by your health care provider. Make sure you discuss any questions you have with your healthcare provider. Document Revised: 05/01/2020 Document Reviewed: 05/01/2020 Elsevier Patient Education  2022 [employer].

## 2021-06-19 NOTE — Progress Notes (Signed)
   [facility] Patient Hannah Hawkins [address] Anastasia Hawkins [place], [place]  [address] Phone:  [phone]   Fax:  [phone] Virtual Visit via Video Note  I connected with Hannah Hawkins on 06/19/21 at  3:20 PM EDT by video and verified that I am speaking with the correct person using two identifiers.   I discussed the limitations, risks, security and privacy concerns of performing an evaluation and management service by video and the availability of in person appointments. I also discussed with the patient that there may be a patient responsible charge related to this service. The patient expressed understanding and agreed to proceed.  Patient driving  Provider Office  History of Present Illness: She is SP lap choly on 06/03/21. She is having pain 6/10 in leg and thigh and ankle. Denies fever, headache, cough, wheezing, shortness of breath, chest pains, abdominal pain, back pain, hip pain. Denies any open wounds, skin irritation.   Observations/Objective: No acute distress  Assessment and Plan: Assessment  Primary Diagnosis & Pertinent Problem List: The primary encounter diagnosis was Sickle cell disease without crisis (HCC). Diagnoses of Chronic prescription opiate use, Chronic pain syndrome, Cocaine use, and Nausea were also pertinent to this visit.  Visit Diagnosis: 1. Sickle cell disease without crisis (HCC)  Trial Duloxetine 30 mg daily may increase to BID if needed Ensure adequate hydration. Move frequently to reduce venous thromboembolism risk. Avoid situations that could lead to dehydration or could exacerbate pain Discussed S&S of infection, seizures, stroke acute chest, DVT and how important it is to seek medical attention Take medication as directed along with pain contract and overall compliance Discussed the risk related to opiate use (addition, tolerance and dependency)   2. Chronic prescription opiate use   3. Chronic pain syndrome   4. Cocaine use  No additional  refills on opioid  patient to have 3 UDS 2 weeks intervals;negative for any illegal substance before returning to opioid therapy. UDS pending  5. Nausea      Follow Up Instructions: 2 weeks UDS   I discussed the assessment and treatment plan with the patient. The patient was provided an opportunity to ask questions and all were answered. The patient agreed with the plan and demonstrated an understanding of the instructions.   The patient presents for dysphagia 1 advised to call back or seek an in-person evaluation if the symptoms worsen or if the condition fails to improve as anticipated.  I provided 12  minutes of video- visit time during this encounter.   Barbette Merino, NP

## 2021-06-29 ENCOUNTER — Other Ambulatory Visit: Payer: Self-pay | Admitting: Nurse Practitioner

## 2021-06-29 ENCOUNTER — Telehealth: Payer: Self-pay

## 2021-06-29 MED ORDER — DULOXETINE HCL 30 MG PO CPEP: 30.0000 mg | ORAL_CAPSULE | Freq: Two times a day (BID) | ORAL | 0 refills | Status: DC

## 2021-06-29 NOTE — Telephone Encounter (Signed)
Duloxetine   Asking to increase to 60 mg Or a different prescription

## 2021-07-03 ENCOUNTER — Emergency Department (HOSPITAL_COMMUNITY): Payer: Medicaid Other

## 2021-07-03 ENCOUNTER — Other Ambulatory Visit: Payer: Self-pay

## 2021-07-03 ENCOUNTER — Emergency Department (HOSPITAL_COMMUNITY)
Admission: EM | Admit: 2021-07-03 | Discharge: 2021-07-04 | Disposition: A | Discharge status: Home / Self Care | Payer: Medicaid Other | Attending: Emergency Medicine | Admitting: Emergency Medicine

## 2021-07-03 DIAGNOSIS — Z87891 Personal history of nicotine dependence: Secondary | ICD-10-CM | POA: Insufficient documentation

## 2021-07-03 DIAGNOSIS — D5700 Hb-SS disease with crisis, unspecified: Secondary | ICD-10-CM | POA: Diagnosis present

## 2021-07-03 LAB — CBC WITH DIFFERENTIAL/PLATELET
Abs Immature Granulocytes: 0.08 10*3/uL — ABNORMAL HIGH (ref 0.00–0.07)
Basophils Absolute: 0.1 10*3/uL (ref 0.0–0.1)
Basophils Relative: 1 %
Eosinophils Absolute: 0.3 10*3/uL (ref 0.0–0.5)
Eosinophils Relative: 2 %
HCT: 32.8 % — ABNORMAL LOW (ref 36.0–46.0)
Hemoglobin: 11.8 g/dL — ABNORMAL LOW (ref 12.0–15.0)
Immature Granulocytes: 1 %
Lymphocytes Relative: 13 %
Lymphs Abs: 2.2 10*3/uL (ref 0.7–4.0)
MCH: 26.2 pg (ref 26.0–34.0)
MCHC: 36 g/dL (ref 30.0–36.0)
MCV: 72.7 fL — ABNORMAL LOW (ref 80.0–100.0)
Monocytes Absolute: 1.7 10*3/uL — ABNORMAL HIGH (ref 0.1–1.0)
Monocytes Relative: 10 %
Neutro Abs: 12.7 10*3/uL — ABNORMAL HIGH (ref 1.7–7.7)
Neutrophils Relative %: 73 %
Platelets: 356 10*3/uL (ref 150–400)
RBC: 4.51 MIL/uL (ref 3.87–5.11)
RDW: 13.9 % (ref 11.5–15.5)
WBC: 16.9 10*3/uL — ABNORMAL HIGH (ref 4.0–10.5)
nRBC: 0.7 % — ABNORMAL HIGH (ref 0.0–0.2)

## 2021-07-03 LAB — COMPREHENSIVE METABOLIC PANEL
ALT: 17 U/L (ref 0–44)
AST: 32 U/L (ref 15–41)
Albumin: 4.3 g/dL (ref 3.5–5.0)
Alkaline Phosphatase: 51 U/L (ref 38–126)
Anion gap: 6 (ref 5–15)
BUN: 8 mg/dL (ref 6–20)
CO2: 25 mmol/L (ref 22–32)
Calcium: 9 mg/dL (ref 8.9–10.3)
Chloride: 105 mmol/L (ref 98–111)
Creatinine, Ser: 0.73 mg/dL (ref 0.44–1.00)
GFR, Estimated: >60 mL/min (ref >60)
Glucose, Bld: 87 mg/dL (ref 70–99)
Potassium: 3.7 mmol/L (ref 3.5–5.1)
Sodium: 136 mmol/L (ref 135–145)
Total Bilirubin: 0.7 mg/dL (ref 0.3–1.2)
Total Protein: 8.4 g/dL — ABNORMAL HIGH (ref 6.5–8.1)

## 2021-07-03 LAB — I-STAT BETA HCG BLOOD, ED (MC, WL, AP ONLY): I-stat hCG, quantitative: <5 m[IU]/mL (ref <5)

## 2021-07-03 LAB — RETICULOCYTES
Immature Retic Fract: 15.1 % (ref 2.3–15.9)
RBC.: 4.47 MIL/uL (ref 3.87–5.11)
Retic Count, Absolute: 93.4 10*3/uL (ref 19.0–186.0)
Retic Ct Pct: 2.1 % (ref 0.4–3.1)

## 2021-07-03 MED ORDER — HYDROMORPHONE HCL 2 MG/ML IJ SOLN
2.0000 mg | INTRAMUSCULAR | Status: AC
  Administered 2021-07-03: 2 mg via INTRAVENOUS
  Filled 2021-07-03: qty 1

## 2021-07-03 MED ORDER — MORPHINE SULFATE 30 MG PO TABS: 30.0000 mg | ORAL_TABLET | Freq: Once | ORAL | Status: DC

## 2021-07-03 MED ORDER — OXYCODONE HCL 5 MG PO TABS
15.0000 mg | ORAL_TABLET | Freq: Once | ORAL | Status: AC
  Administered 2021-07-04: 15 mg via ORAL
  Filled 2021-07-03: qty 3

## 2021-07-03 MED ORDER — DIPHENHYDRAMINE HCL 50 MG/ML IJ SOLN
25.0000 mg | Freq: Once | INTRAMUSCULAR | Status: AC
  Administered 2021-07-03: 25 mg via INTRAVENOUS
  Filled 2021-07-03: qty 1

## 2021-07-03 NOTE — ED Provider Notes (Signed)
[facility] DEPT Provider Note   CSN: [id number] Arrival date & time: 07/03/21  1925     History Chief Complaint  Patient presents with   Sickle Cell Pain Crisis    Hannah Hawkins is a 35 y.o. female with past medical history significant for sickle cell anemia, leukocytosis, hypokalemia presents to emergency department with chief complaint of sickle cell pain crisis pain in her right leg x 2 days. Pain is sharp and radiates down her right leg. Pain has been constant. She rates pain 10/10 in severity. She states this feels like her typical sickle cell pain. She denies any fall or injury to her leg. She also reports shortness of breath when pain is severe. Denies dyspnea with exertion.  Chart review shows patient seen 06/19/21 by pcp. Note includes patient taking trial of Duloxetine 30 mg daily may increase to BID if needed. Cocaine use  No additional refills on opioid  patient to have 3 UDS 2 weeks intervals;negative for any illegal substance before returning to opioid therapy. UDS pending Patient states despite taking home pain medications she has been unable to control her leg pain. She tells me that the urine drug test from PCP is scheduled for an upcoming date. She denies any drug or cocaine use recently. Denies any fever, chills, cough, hemoptysis, back pain, extremity swelling, nausea, vomiting, urinary symptoms, diarrhea.   Past Medical History:  Diagnosis Date   Sickle cell anemia ([place])     Patient Active Problem List   Diagnosis Date Noted   Cholelithiasis 06/02/2021   Epigastric pain    Lactic acidosis    RUQ abdominal pain    Rhabdomyolysis 08/07/2020   Sickle cell crisis ([place]) 08/06/2020   Elevated LFTs 08/06/2020   Cyst of spleen 08/06/2020   Leukocytosis 08/06/2020   SIRS (systemic inflammatory response syndrome) (HCC) 08/06/2020   Non-intractable vomiting    Joint pain in fingers of both hands 06/11/2020   Hypokalemia 07/17/2019    GERD (gastroesophageal reflux disease) 05/29/2019   History of cocaine use 05/18/2019   Chronic pain syndrome    Cocaine use    Sickle cell pain crisis ([place]) 11/14/2017   Anemia of chronic disease 11/13/2017   Sinusitis, chronic 11/13/2017   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    Past Surgical History:  Procedure Laterality Date   CHOLECYSTECTOMY N/A 06/03/2021   Procedure: LAPAROSCOPIC CHOLECYSTECTOMY;  Surgeon: Coralie Keens, MD;  Location: [facility]S;  Service: General;  Laterality: N/A;   EYE SURGERY  03/09/2017   left eye   EYE SURGERY Right 09/2019   right [place]mer[place]      OB History     Gravida  2   Para      Term      Preterm      AB      Living  1      SAB      IAB      Ectopic      Multiple      Live Births              Family History  Problem Relation Age of Onset   Diabetes Father     Social History   Tobacco Use   Smoking status: Former    Types: Cigarettes   Smokeless tobacco: Never  Vaping Use   Vaping Use: Never used  Substance Use Topics   Alcohol use: Not Currently   Drug use: No    Home Medications Prior to Admission  medications   Medication Sig Start Date End Date Taking? Authorizing Provider  citalopram (CELEXA) 20 MG tablet Take 1 tablet (20 mg total) by mouth daily. 04/16/21 04/16/22 Yes Vevelyn Francois, NP  DULoxetine (CYMBALTA) 30 MG capsule Take 1 capsule (30 mg total) by mouth 2 (two) times daily. 06/29/21 06/29/22 Yes Vevelyn Francois, NP  ibuprofen (ADVIL) 800 MG tablet Take 1 tablet (800 mg total) by mouth 3 (three) times daily. 09/25/20 09/25/21 Yes King, Diona Foley, NP  levocetirizine (XYZAL) 5 MG tablet Take 5 mg by mouth every evening.   Yes [provider]  lidocaine (LIDODERM) 5 % Place 1 patch onto the skin daily. Remove & Discard patch within 12 hours or as directed by MD 06/19/21  Yes Vevelyn Francois, NP  montelukast (SINGULAIR) 10 MG tablet Take 1 tablet (10 mg total) by mouth at bedtime. 05/02/20   Yes Vevelyn Francois, NP  ondansetron (ZOFRAN-ODT) 8 MG disintegrating tablet Take 1 tablet (8 mg total) by mouth 2 (two) times daily for 15 days. 06/19/21 07/04/21 Yes King, Diona Foley, NP  tiZANidine (ZANAFLEX) 2 MG tablet Take 1 tablet (2 mg total) by mouth every 6 (six) hours as needed for muscle spasms. 05/28/21  Yes King, Diona Foley, NP  NARCAN 4 MG/0.1ML LIQD nasal spray kit Place 4 mg into the nose once as needed (overdose). 12/22/17   [provider]  oxyCODONE (ROXICODONE) 15 MG immediate release tablet Take 1 tablet (15 mg total) by mouth every 6 (six) hours as needed for up to 15 days for pain (moderate to severe pain). Patient not taking: Reported on 07/03/2021 06/15/21 06/30/21  Vevelyn Francois, NP    Allergies    Penicillins  Review of Systems   Review of Systems All other systems are reviewed and are negative for acute change except as noted in the HPI.  Physical Exam Updated Vital Signs BP 122/81   Pulse 91   Temp 98 F (36.7 C) (Oral)   Resp 16   Ht '5\' 3"'  (1.6 m)   Wt 59 kg   SpO2 100%   BMI 23.03 kg/m   Physical Exam Vitals and nursing note reviewed.  Constitutional:      General: She is not in acute distress.    Appearance: She is not ill-appearing.  HENT:     Head: Normocephalic and atraumatic.     Right Ear: External ear normal.     Left Ear: External ear normal.     Nose: Nose normal.     Mouth/Throat:     Mouth: Mucous membranes are moist.     Pharynx: Oropharynx is clear.  Eyes:     General: No scleral icterus.       Right eye: No discharge.        Left eye: No discharge.     Extraocular Movements: Extraocular movements intact.     Conjunctiva/sclera: Conjunctivae normal.     Pupils: Pupils are equal, round, and reactive to light.  Neck:     Vascular: No JVD.  Cardiovascular:     Rate and Rhythm: Normal rate and regular rhythm.     Pulses: Normal pulses.          Radial pulses are 2+ on the right side and 2+ on the left side.       Dorsalis  pedis pulses are 2+ on the right side and 2+ on the left side.     Heart sounds: Normal heart sounds.  Pulmonary:  Comments: Lungs clear to auscultation in all fields. Symmetric chest rise. No wheezing, rales, or rhonchi. Chest:     Chest wall: No tenderness.  Abdominal:     Comments: Abdomen is soft, non-distended, and non-tender in all quadrants. No rigidity, no guarding. No peritoneal signs.  Musculoskeletal:        General: Normal range of motion.     Cervical back: Normal range of motion.     Comments: Pelvis is stable. Full ROM of right lower extremity. Dose have pain in hip with flexion. Compartments are soft in right lower extremity. Neurovascularly intact distally.  No lower extremity edema   Skin:    General: Skin is warm and dry.     Capillary Refill: Capillary refill takes less than 2 seconds.     Comments: Equal tactile temperature in all extremities  Neurological:     Mental Status: She is oriented to person, place, and time.     GCS: GCS eye subscore is 4. GCS verbal subscore is 5. GCS motor subscore is 6.     Comments: Fluent speech, no facial droop.  Psychiatric:        Behavior: Behavior normal.    ED Results / Procedures / Treatments   Labs (all labs ordered are listed, but only abnormal results are displayed) Labs Reviewed  COMPREHENSIVE METABOLIC PANEL - Abnormal; Notable for the following components:      Result Value   Total Protein 8.4 (*)    All other components within normal limits  CBC WITH DIFFERENTIAL/PLATELET - Abnormal; Notable for the following components:   WBC 16.9 (*)    Hemoglobin 11.8 (*)    HCT 32.8 (*)    MCV 72.7 (*)    nRBC 0.7 (*)    Neutro Abs 12.7 (*)    Monocytes Absolute 1.7 (*)    Abs Immature Granulocytes 0.08 (*)    All other components within normal limits  RETICULOCYTES  I-STAT BETA HCG BLOOD, ED ([facility], [facility], [facility] ONLY)    EKG None  Radiology DG Chest Portable 1 View  Result Date: 07/03/2021 CLINICAL DATA:  Shortness  of breath EXAM: PORTABLE CHEST 1 VIEW COMPARISON:  06/02/2021 FINDINGS: The heart size and mediastinal contours are within normal limits. Both lungs are clear. The visualized skeletal structures are unremarkable. IMPRESSION: No active disease. Electronically Signed   By: Donavan Foil M.D.   On: 07/03/2021 20:47   DG Hip Unilat W or Wo Pelvis 2-3 Views Right  Result Date: 07/03/2021 CLINICAL DATA:  Pain, history of sickle cell EXAM: DG HIP (WITH OR WITHOUT PELVIS) 2-3V RIGHT COMPARISON:  CT 05/15/2021 FINDINGS: There is no evidence of hip fracture or dislocation. There is no evidence of arthropathy or other focal bone abnormality. IMPRESSION: Negative. Electronically Signed   By: Donavan Foil M.D.   On: 07/03/2021 21:51    Procedures Procedures   Medications Ordered in ED Medications  HYDROmorphone (DILAUDID) injection 2 mg (2 mg Intravenous Given 07/03/21 2119)  HYDROmorphone (DILAUDID) injection 2 mg (2 mg Intravenous Given 07/03/21 2232)  diphenhydrAMINE (BENADRYL) injection 25 mg (25 mg Intravenous Given 07/03/21 2118)  oxyCODONE (Oxy IR/ROXICODONE) immediate release tablet 15 mg (15 mg Oral Given 07/04/21 0022)    ED Course  I have reviewed the triage vital signs and the nursing notes.  Pertinent labs & imaging results that were available during my care of the patient were reviewed by me and considered in my medical decision making (see chart for details).  MDM Rules/Calculators/A&P                          History provided by patient with additional history obtained from chart review.    35 y.o. female with PMH/o Sickle Cell anemia who presents for evaluation of sickle cell related pain.  Patient is afebrile, non-toxic appearing, sitting comfortably on examination table. Vital signs reviewed and stable. Right leg exam is benign. She is neurovascularly intact with soft compartments. Patient given pain medicine per sickle cell order set. Initials labs ordered.   Labs reviewed. CBC  with nonspecific leukocytosis 16.9. No infections symptoms. Chest xray without signs of acute chest. Hemoglobin consistent with baseline. CMP and retic count overall unremarkable. Pregnancy test is negative. Xray of right hip without signs of fracture, no signs of avascular necrosis. Agree with radiologist impressions.  Patient reports improved after 2 rounds of IV pain medication and PO oxycodone. Will plan for discharge home. Patient offered UDS here however tells me it needs to be ordered and followed up with by her pcp. Strict return precautions discussed.   Portions of this note were generated with [employer]. Dictation errors may occur despite best attempts at proofreading.   Final Clinical Impression(s) / ED Diagnoses Final diagnoses:  Sickle cell pain crisis [facility])    Rx / DC Orders ED Discharge Orders     None        Lewanda Rife 07/04/21 0121    Quintella Reichert, MD 07/06/21 1541

## 2021-07-03 NOTE — ED Notes (Signed)
Pt keeps pulling off BP cuff

## 2021-07-03 NOTE — ED Triage Notes (Signed)
Pt came in with [facility]. Pt has R leg pain. Started yesterday

## 2021-07-03 NOTE — ED Provider Notes (Signed)
Emergency Medicine Provider Triage Evaluation Note  Hannah Hawkins , a 35 y.o. female  was evaluated in triage.  Pt complains of sickle cell pain crisis. She endorses right leg pain which is typical for her pain crisis. Pain started yesterday. She has been taking her home medications every 4 hours with no relief. Pain worse with ambulation. She endorses SOB that started today. No chest pain. History of acute chest syndrome. No history of DVT. No fever, chills, or cough.  Review of Systems  Positive: Arthralgia, SOB Negative: fever  Physical Exam  BP 122/81   Pulse 91   Temp 98 F (36.7 C) (Oral)   Resp 16   Ht 5\' 3"  (1.6 m)   Wt 59 kg   SpO2 100%   BMI 23.03 kg/m  Gen:   Awake, no distress   Resp:  Normal effort  MSK:   Moves extremities without difficulty  Other:    Medical Decision Making  Medically screening exam initiated at 8:10 PM.  Appropriate orders placed.  Hannah Hawkins was informed that the remainder of the evaluation will be completed by another provider, this initial triage assessment does not replace that evaluation, and the importance of remaining in the ED until their evaluation is complete.  Sickle cell labs CXR   Hannah Hawkins 07/03/21 2012    2013, MD 07/07/21 [id number]

## 2021-07-04 NOTE — Discharge Instructions (Addendum)
Follow up with your primary care doctor for recheck  Xray of your hip and chest were normal today.

## 2021-07-07 ENCOUNTER — Emergency Department (HOSPITAL_COMMUNITY)
Admission: EM | Admit: 2021-07-07 | Discharge: 2021-07-07 | Disposition: A | Discharge status: Home / Self Care | Payer: Medicaid Other | Attending: Emergency Medicine | Admitting: Emergency Medicine

## 2021-07-07 ENCOUNTER — Encounter (HOSPITAL_COMMUNITY): Payer: Self-pay

## 2021-07-07 DIAGNOSIS — D57219 Sickle-cell/Hb-C disease with crisis, unspecified: Secondary | ICD-10-CM | POA: Diagnosis present

## 2021-07-07 DIAGNOSIS — Z87891 Personal history of nicotine dependence: Secondary | ICD-10-CM | POA: Insufficient documentation

## 2021-07-07 DIAGNOSIS — D5700 Hb-SS disease with crisis, unspecified: Secondary | ICD-10-CM

## 2021-07-07 LAB — CBC WITH DIFFERENTIAL/PLATELET
Abs Immature Granulocytes: 0.04 10*3/uL (ref 0.00–0.07)
Basophils Absolute: 0.1 10*3/uL (ref 0.0–0.1)
Basophils Relative: 1 %
Eosinophils Absolute: 0.4 10*3/uL (ref 0.0–0.5)
Eosinophils Relative: 4 %
HCT: 31.5 % — ABNORMAL LOW (ref 36.0–46.0)
Hemoglobin: 11.6 g/dL — ABNORMAL LOW (ref 12.0–15.0)
Immature Granulocytes: 1 %
Lymphocytes Relative: 31 %
Lymphs Abs: 2.6 10*3/uL (ref 0.7–4.0)
MCH: 26.3 pg (ref 26.0–34.0)
MCHC: 36.8 g/dL — ABNORMAL HIGH (ref 30.0–36.0)
MCV: 71.4 fL — ABNORMAL LOW (ref 80.0–100.0)
Monocytes Absolute: 0.9 10*3/uL (ref 0.1–1.0)
Monocytes Relative: 11 %
Neutro Abs: 4.4 10*3/uL (ref 1.7–7.7)
Neutrophils Relative %: 52 %
Platelets: 419 10*3/uL — ABNORMAL HIGH (ref 150–400)
RBC: 4.41 MIL/uL (ref 3.87–5.11)
RDW: 14.4 % (ref 11.5–15.5)
WBC: 8.3 10*3/uL (ref 4.0–10.5)
nRBC: 0.6 % — ABNORMAL HIGH (ref 0.0–0.2)

## 2021-07-07 LAB — COMPREHENSIVE METABOLIC PANEL
ALT: 13 U/L (ref 0–44)
AST: 17 U/L (ref 15–41)
Albumin: 3.9 g/dL (ref 3.5–5.0)
Alkaline Phosphatase: 47 U/L (ref 38–126)
Anion gap: 7 (ref 5–15)
BUN: 6 mg/dL (ref 6–20)
CO2: 26 mmol/L (ref 22–32)
Calcium: 9 mg/dL (ref 8.9–10.3)
Chloride: 107 mmol/L (ref 98–111)
Creatinine, Ser: 0.69 mg/dL (ref 0.44–1.00)
GFR, Estimated: >60 mL/min (ref >60)
Glucose, Bld: 96 mg/dL (ref 70–99)
Potassium: 3.5 mmol/L (ref 3.5–5.1)
Sodium: 140 mmol/L (ref 135–145)
Total Bilirubin: 1 mg/dL (ref 0.3–1.2)
Total Protein: 7.3 g/dL (ref 6.5–8.1)

## 2021-07-07 LAB — RETICULOCYTES
Immature Retic Fract: 18.1 % — ABNORMAL HIGH (ref 2.3–15.9)
RBC.: 4.38 MIL/uL (ref 3.87–5.11)
Retic Count, Absolute: 82.3 10*3/uL (ref 19.0–186.0)
Retic Ct Pct: 1.9 % (ref 0.4–3.1)

## 2021-07-07 LAB — I-STAT BETA HCG BLOOD, ED (MC, WL, AP ONLY): I-stat hCG, quantitative: <5 m[IU]/mL (ref <5)

## 2021-07-07 MED ORDER — MAGNESIUM HYDROXIDE 400 MG/5ML PO SUSP
30.0000 mL | Freq: Once | ORAL | Status: AC
  Administered 2021-07-07: 30 mL via ORAL
  Filled 2021-07-07: qty 30

## 2021-07-07 MED ORDER — ONDANSETRON HCL 4 MG/2ML IJ SOLN
4.0000 mg | INTRAMUSCULAR | Status: DC | PRN
  Filled 2021-07-07: qty 2

## 2021-07-07 MED ORDER — SODIUM CHLORIDE 0.45 % IV SOLN: INTRAVENOUS | Status: DC

## 2021-07-07 MED ORDER — DIPHENHYDRAMINE HCL 25 MG PO CAPS
25.0000 mg | ORAL_CAPSULE | ORAL | Status: DC | PRN
  Administered 2021-07-07: 25 mg via ORAL
  Filled 2021-07-07: qty 1

## 2021-07-07 MED ORDER — HYDROMORPHONE HCL 2 MG/ML IJ SOLN
2.0000 mg | INTRAMUSCULAR | Status: AC
  Administered 2021-07-07: 2 mg via INTRAVENOUS
  Filled 2021-07-07: qty 1

## 2021-07-07 MED ORDER — OXYCODONE-ACETAMINOPHEN 5-325 MG PO TABS
1.0000 | ORAL_TABLET | Freq: Once | ORAL | Status: AC
Start: 2021-07-07 — End: 2021-07-07
  Administered 2021-07-07: 1 via ORAL
  Filled 2021-07-07: qty 1

## 2021-07-07 NOTE — ED Notes (Signed)
Babette Relic Broadus John is on their way to see the patient

## 2021-07-07 NOTE — ED Notes (Signed)
Patient was given beverages to drink. Patient is eating her own food.

## 2021-07-07 NOTE — ED Notes (Signed)
Patient is in room. Rates pain 10/10 in her legs.

## 2021-07-07 NOTE — ED Provider Notes (Signed)
[facility] DEPT Provider Note   CSN: [id number] Arrival date & time: 07/07/21  2013     History No chief complaint on file.   Hannah Hawkins is a 35 y.o. female.  35 year old female with history of sickle cell disease presents with diffuse body aches consistent with her prior sickle cell vaso-occlusive crisis.  Denies any fever or chills.  Some nausea no vomiting.  No chest pain or new shortness of breath.  Patient ran out of her home oxycodone yesterday.  Has been using ibuprofen as of late      Past Medical History:  Diagnosis Date   Sickle cell anemia ([place])     Patient Active Problem List   Diagnosis Date Noted   Cholelithiasis 06/02/2021   Epigastric pain    Lactic acidosis    RUQ abdominal pain    Rhabdomyolysis 08/07/2020   Sickle cell crisis ([place]) 08/06/2020   Elevated LFTs 08/06/2020   Cyst of spleen 08/06/2020   Leukocytosis 08/06/2020   SIRS (systemic inflammatory response syndrome) (HCC) 08/06/2020   Non-intractable vomiting    Joint pain in fingers of both hands 06/11/2020   Hypokalemia 07/17/2019   GERD (gastroesophageal reflux disease) 05/29/2019   History of cocaine use 05/18/2019   Chronic pain syndrome    Cocaine use    Sickle cell pain crisis ([place]) 11/14/2017   Anemia of chronic disease 11/13/2017   Sinusitis, chronic 11/13/2017   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    Past Surgical History:  Procedure Laterality Date   CHOLECYSTECTOMY N/A 06/03/2021   Procedure: LAPAROSCOPIC CHOLECYSTECTOMY;  Surgeon: Coralie Keens, MD;  Location: [facility];  Service: General;  Laterality: N/A;   EYE SURGERY  03/09/2017   left eye   EYE SURGERY Right 09/2019   right hemerage      OB History     Gravida  2   Para      Term      Preterm      AB      Living  1      SAB      IAB      Ectopic      Multiple      Live Births              Family History  Problem Relation Age of Onset    Diabetes Father     Social History   Tobacco Use   Smoking status: Former    Types: Cigarettes   Smokeless tobacco: Never  Vaping Use   Vaping Use: Never used  Substance Use Topics   Alcohol use: Not Currently   Drug use: No    Home Medications Prior to Admission medications   Medication Sig Start Date End Date Taking? Authorizing Provider  citalopram (CELEXA) 20 MG tablet Take 1 tablet (20 mg total) by mouth daily. 04/16/21 04/16/22  Vevelyn Francois, NP  DULoxetine (CYMBALTA) 30 MG capsule Take 1 capsule (30 mg total) by mouth 2 (two) times daily. 06/29/21 06/29/22  Vevelyn Francois, NP  ibuprofen (ADVIL) 800 MG tablet Take 1 tablet (800 mg total) by mouth 3 (three) times daily. 09/25/20 09/25/21  Vevelyn Francois, NP  levocetirizine (XYZAL) 5 MG tablet Take 5 mg by mouth every evening.    [provider]  lidocaine (LIDODERM) 5 % Place 1 patch onto the skin daily. Remove & Discard patch within 12 hours or as directed by MD 06/19/21   Vevelyn Francois, NP  montelukast (SINGULAIR)  10 MG tablet Take 1 tablet (10 mg total) by mouth at bedtime. 05/02/20   Vevelyn Francois, NP  NARCAN 4 MG/0.1ML LIQD nasal spray kit Place 4 mg into the nose once as needed (overdose). 12/22/17   [provider]  oxyCODONE (ROXICODONE) 15 MG immediate release tablet Take 1 tablet (15 mg total) by mouth every 6 (six) hours as needed for up to 15 days for pain (moderate to severe pain). Patient not taking: Reported on 07/03/2021 06/15/21 06/30/21  Vevelyn Francois, NP  tiZANidine (ZANAFLEX) 2 MG tablet Take 1 tablet (2 mg total) by mouth every 6 (six) hours as needed for muscle spasms. 05/28/21   Vevelyn Francois, NP    Allergies    Penicillins  Review of Systems   Review of Systems  All other systems reviewed and are negative.  Physical Exam Updated Vital Signs There were no vitals taken for this visit.  Physical Exam Vitals and nursing note reviewed.  Constitutional:      General: She is not in  acute distress.    Appearance: Normal appearance. She is well-developed. She is not toxic-appearing.  HENT:     Head: Normocephalic and atraumatic.  Eyes:     General: Lids are normal.     Conjunctiva/sclera: Conjunctivae normal.     Pupils: Pupils are equal, round, and reactive to light.  Neck:     Thyroid: No thyroid mass.     Trachea: No tracheal deviation.  Cardiovascular:     Rate and Rhythm: Normal rate and regular rhythm.     Heart sounds: Normal heart sounds. No murmur heard.   No gallop.  Pulmonary:     Effort: Pulmonary effort is normal. No respiratory distress.     Breath sounds: Normal breath sounds. No stridor. No decreased breath sounds, wheezing, rhonchi or rales.  Abdominal:     General: There is no distension.     Palpations: Abdomen is soft.     Tenderness: There is no abdominal tenderness. There is no rebound.  Musculoskeletal:        General: No tenderness. Normal range of motion.     Cervical back: Normal range of motion and neck supple.  Skin:    General: Skin is warm and dry.     Findings: No abrasion or rash.  Neurological:     Mental Status: She is alert and oriented to person, place, and time. Mental status is at baseline.     GCS: GCS eye subscore is 4. GCS verbal subscore is 5. GCS motor subscore is 6.     Cranial Nerves: Cranial nerves are intact. No cranial nerve deficit.     Sensory: No sensory deficit.     Motor: Motor function is intact.  Psychiatric:        Attention and Perception: Attention normal.        Speech: Speech normal.        Behavior: Behavior normal.    ED Results / Procedures / Treatments   Labs (all labs ordered are listed, but only abnormal results are displayed) Labs Reviewed  CBC WITH DIFFERENTIAL/PLATELET  COMPREHENSIVE METABOLIC PANEL  RETICULOCYTES  I-STAT BETA HCG BLOOD, ED ([facility], [facility], [facility] ONLY)    EKG None  Radiology No results found.  Procedures Procedures   Medications Ordered in ED Medications  0.45 %  sodium chloride infusion (has no administration in time range)  HYDROmorphone (DILAUDID) injection 2 mg (has no administration in time range)  HYDROmorphone (DILAUDID) injection  2 mg (has no administration in time range)  diphenhydrAMINE (BENADRYL) capsule 25-50 mg (has no administration in time range)  ondansetron (ZOFRAN) injection 4 mg (has no administration in time range)    ED Course  I have reviewed the triage vital signs and the nursing notes.  Pertinent labs & imaging results that were available during my care of the patient were reviewed by me and considered in my medical decision making (see chart for details).    MDM Rules/Calculators/A&P                           Patient given IV hydromorphone x2 here.  Labs are reassuring here.  Will discharge Final Clinical Impression(s) / ED Diagnoses Final diagnoses:  None    Rx / DC Orders ED Discharge Orders     None        Lacretia Leigh, MD 07/07/21 2251

## 2021-07-13 ENCOUNTER — Telehealth: Payer: Self-pay

## 2021-07-13 NOTE — Telephone Encounter (Signed)
Please call pt in regards of change

## 2021-07-17 ENCOUNTER — Telehealth: Payer: Self-pay

## 2021-07-17 NOTE — Telephone Encounter (Signed)
Called pt to schedule an appt from in basket messsge

## 2021-07-25 ENCOUNTER — Encounter (HOSPITAL_COMMUNITY): Payer: Self-pay | Admitting: Emergency Medicine

## 2021-07-25 ENCOUNTER — Emergency Department (HOSPITAL_COMMUNITY)
Admission: EM | Admit: 2021-07-25 | Discharge: 2021-07-26 | Disposition: A | Discharge status: Home / Self Care | Payer: Medicaid Other | Attending: Emergency Medicine | Admitting: Emergency Medicine

## 2021-07-25 ENCOUNTER — Other Ambulatory Visit: Payer: Self-pay

## 2021-07-25 DIAGNOSIS — D5700 Hb-SS disease with crisis, unspecified: Secondary | ICD-10-CM | POA: Diagnosis present

## 2021-07-25 DIAGNOSIS — Z87891 Personal history of nicotine dependence: Secondary | ICD-10-CM | POA: Insufficient documentation

## 2021-07-25 NOTE — ED Triage Notes (Addendum)
Reports sickle cells pain BLLE x 3 days. Recently ran out of home pain meds. 20G RFA. 100 mcg fentanyl given PTA. VSS. Denies chest pain/shob.

## 2021-07-26 LAB — COMPREHENSIVE METABOLIC PANEL
ALT: 12 U/L (ref 0–44)
AST: 18 U/L (ref 15–41)
Albumin: 4 g/dL (ref 3.5–5.0)
Alkaline Phosphatase: 50 U/L (ref 38–126)
Anion gap: 10 (ref 5–15)
BUN: 7 mg/dL (ref 6–20)
CO2: 24 mmol/L (ref 22–32)
Calcium: 9.5 mg/dL (ref 8.9–10.3)
Chloride: 104 mmol/L (ref 98–111)
Creatinine, Ser: 0.71 mg/dL (ref 0.44–1.00)
GFR, Estimated: >60 mL/min (ref >60)
Glucose, Bld: 116 mg/dL — ABNORMAL HIGH (ref 70–99)
Potassium: 3.1 mmol/L — ABNORMAL LOW (ref 3.5–5.1)
Sodium: 138 mmol/L (ref 135–145)
Total Bilirubin: 0.7 mg/dL (ref 0.3–1.2)
Total Protein: 7.1 g/dL (ref 6.5–8.1)

## 2021-07-26 LAB — CBC WITH DIFFERENTIAL/PLATELET
Band Neutrophils: 0 %
Basophils Relative: 0 %
Blasts: NONE SEEN %
Eosinophils Relative: 11 %
HCT: 28.7 % — ABNORMAL LOW (ref 36.0–46.0)
Hemoglobin: 10.7 g/dL — ABNORMAL LOW (ref 12.0–15.0)
Lymphocytes Relative: 38 %
MCH: 27.2 pg (ref 26.0–34.0)
MCHC: 37.3 g/dL — ABNORMAL HIGH (ref 30.0–36.0)
MCV: 72.8 fL — ABNORMAL LOW (ref 80.0–100.0)
Metamyelocytes Relative: NONE SEEN %
Monocytes Relative: 8 %
Myelocytes: NONE SEEN %
Neutrophils Relative %: 43 %
Platelets: 351 10*3/uL (ref 150–400)
Promyelocytes Relative: NONE SEEN %
RBC: 3.94 MIL/uL (ref 3.87–5.11)
RDW: 15.3 % (ref 11.5–15.5)
WBC Morphology: NORMAL
WBC: 8.3 10*3/uL (ref 4.0–10.5)
nRBC: 5 /100 WBC — ABNORMAL HIGH

## 2021-07-26 LAB — RETICULOCYTES
Immature Retic Fract: 23.4 % — ABNORMAL HIGH (ref 2.3–15.9)
RBC.: 4.02 MIL/uL (ref 3.87–5.11)
Retic Count, Absolute: 114.6 10*3/uL (ref 19.0–186.0)
Retic Ct Pct: 2.9 % (ref 0.4–3.1)

## 2021-07-26 LAB — I-STAT BETA HCG BLOOD, ED (MC, WL, AP ONLY): I-stat hCG, quantitative: <5 m[IU]/mL (ref <5)

## 2021-07-26 MED ORDER — DIPHENHYDRAMINE HCL 25 MG PO CAPS
50.0000 mg | ORAL_CAPSULE | Freq: Once | ORAL | Status: AC
  Administered 2021-07-26: 50 mg via ORAL

## 2021-07-26 MED ORDER — HYDROMORPHONE HCL 1 MG/ML IJ SOLN
INTRAMUSCULAR | Status: AC
  Filled 2021-07-26: qty 1

## 2021-07-26 MED ORDER — HYDROMORPHONE HCL 1 MG/ML IJ SOLN
1.0000 mg | Freq: Once | INTRAMUSCULAR | Status: AC
  Administered 2021-07-26: 1 mg via INTRAVENOUS

## 2021-07-26 MED ORDER — HYDROMORPHONE HCL 1 MG/ML IJ SOLN
1.0000 mg | INTRAMUSCULAR | Status: AC
  Administered 2021-07-26: 1 mg via INTRAVENOUS
  Filled 2021-07-26: qty 1

## 2021-07-26 MED ORDER — DIPHENHYDRAMINE HCL 25 MG PO CAPS
ORAL_CAPSULE | ORAL | Status: AC
  Filled 2021-07-26: qty 2

## 2021-07-26 MED ORDER — KETOROLAC TROMETHAMINE 15 MG/ML IJ SOLN
15.0000 mg | INTRAMUSCULAR | Status: AC
Start: 2021-07-26 — End: 2021-07-26
  Administered 2021-07-26: 15 mg via INTRAVENOUS
  Filled 2021-07-26: qty 1

## 2021-07-26 NOTE — ED Notes (Signed)
Patient given food and drink.

## 2021-07-26 NOTE — ED Notes (Signed)
Patient requesting another dose of Dilaudid. Melvenia Beam, PA notified.

## 2021-07-26 NOTE — ED Notes (Signed)
Patient has a ride to pick her up for discharge.

## 2021-07-26 NOTE — ED Provider Notes (Signed)
[facility] DEPT Provider Note   CSN: [id number] Arrival date & time: 07/25/21  2320     History Chief Complaint  Patient presents with   Sickle Cell Pain Crisis    Hannah Hawkins is a 35 y.o. female.  Patient to ED with bilateral LE pain c/w sickle cell crisis. No fever, CP, SOB, cough, nausea or vomiting. She reports she has been out of her regular pain medication by direction of her doctor and has re-evaluation pending to start a new regimen.    Sickle Cell Pain Crisis Associated symptoms: no fever, no headaches and no nausea       Past Medical History:  Diagnosis Date   Sickle cell anemia ([place])     Patient Active Problem List   Diagnosis Date Noted   Cholelithiasis 06/02/2021   Epigastric pain    Lactic acidosis    RUQ abdominal pain    Rhabdomyolysis 08/07/2020   Sickle cell crisis ([place]) 08/06/2020   Elevated LFTs 08/06/2020   Cyst of spleen 08/06/2020   Leukocytosis 08/06/2020   SIRS (systemic inflammatory response syndrome) ([place]) 08/06/2020   Non-intractable vomiting    Joint pain in fingers of both hands 06/11/2020   Hypokalemia 07/17/2019   GERD (gastroesophageal reflux disease) 05/29/2019   History of cocaine use 05/18/2019   Chronic pain syndrome    Cocaine use    Sickle cell pain crisis ([place]) 11/14/2017   Anemia of chronic disease 11/13/2017   Sinusitis, chronic 11/13/2017   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    Past Surgical History:  Procedure Laterality Date   CHOLECYSTECTOMY N/A 06/03/2021   Procedure: LAPAROSCOPIC CHOLECYSTECTOMY;  Surgeon: Coralie Keens, MD;  [facility];  Service: General;  Laterality: N/A;   EYE SURGERY  03/09/2017   left eye   EYE SURGERY Right 09/2019   right hemerage      OB History     Gravida  2   Para      Term      Preterm      AB      Living  1      SAB      IAB      Ectopic      Multiple      Live Births              Family History   Problem Relation Age of Onset   Diabetes Father     Social History   Tobacco Use   Smoking status: Former    Types: Cigarettes   Smokeless tobacco: Never  Vaping Use   Vaping Use: Never used  Substance Use Topics   Alcohol use: Not Currently   Drug use: No    Home Medications Prior to Admission medications   Medication Sig Start Date End Date Taking? Authorizing Provider  citalopram (CELEXA) 20 MG tablet Take 1 tablet (20 mg total) by mouth daily. 04/16/21 04/16/22 Yes Vevelyn Francois, NP  ibuprofen (ADVIL) 800 MG tablet Take 1 tablet (800 mg total) by mouth 3 (three) times daily. 09/25/20 09/25/21 Yes King, Diona Foley, NP  montelukast (SINGULAIR) 10 MG tablet Take 1 tablet (10 mg total) by mouth at bedtime. 05/02/20  Yes Vevelyn Francois, NP  DULoxetine (CYMBALTA) 30 MG capsule Take 1 capsule (30 mg total) by mouth 2 (two) times daily. 06/29/21 06/29/22  Vevelyn Francois, NP  lidocaine (LIDODERM) 5 % Place 1 patch onto the skin daily. Remove & Discard patch within 12 hours  or as directed by MD 06/19/21   Vevelyn Francois, NP  [facility] 4 MG/0.1ML LIQD nasal spray kit Place 4 mg into the nose once as needed (overdose). 12/22/17   [provider]  oxyCODONE (ROXICODONE) 15 MG immediate release tablet Take 1 tablet (15 mg total) by mouth every 6 (six) hours as needed for up to 15 days for pain (moderate to severe pain). 06/15/21 06/30/21  Vevelyn Francois, NP  tiZANidine (ZANAFLEX) 2 MG tablet Take 1 tablet (2 mg total) by mouth every 6 (six) hours as needed for muscle spasms. 05/28/21   Vevelyn Francois, NP    Allergies    Penicillins  Review of Systems   Review of Systems  Constitutional:  Negative for chills and fever.  HENT: Negative.    Respiratory: Negative.    Cardiovascular: Negative.   Gastrointestinal: Negative.  Negative for nausea.  Musculoskeletal:  Positive for back pain.       See HPI.  Skin: Negative.   Neurological: Negative.  Negative for numbness and headaches.    Physical Exam Updated Vital Signs BP 140/86   Pulse (!) 55   Temp 98.2 F (36.8 C) (Oral)   Resp 16   SpO2 100%   Physical Exam Vitals and nursing note reviewed.  Constitutional:      Appearance: She is well-developed.  HENT:     Head: Normocephalic.  Cardiovascular:     Rate and Rhythm: Normal rate and regular rhythm.     Heart sounds: No murmur heard. Pulmonary:     Effort: Pulmonary effort is normal.     Breath sounds: Normal breath sounds. No wheezing, rhonchi or rales.  Abdominal:     General: Bowel sounds are normal.     Palpations: Abdomen is soft.     Tenderness: There is no abdominal tenderness. There is no guarding or rebound.  Musculoskeletal:        General: Normal range of motion.     Cervical back: Normal range of motion and neck supple.     Comments: Minimal swelling noted to lateal distal lower right leg. No calf tenderness or swelling. Distal pulses present. No erythema or warmth.   Skin:    General: Skin is warm and dry.  Neurological:     General: No focal deficit present.     Mental Status: She is alert and oriented to person, place, and time.    ED Results / Procedures / Treatments   Labs (all labs ordered are listed, but only abnormal results are displayed) Labs Reviewed  COMPREHENSIVE METABOLIC PANEL - Abnormal; Notable for the following components:      Result Value   Potassium 3.1 (*)    Glucose, Bld 116 (*)    All other components within normal limits  CBC WITH DIFFERENTIAL/PLATELET - Abnormal; Notable for the following components:   Hemoglobin 10.7 (*)    HCT 28.7 (*)    MCV 72.8 (*)    MCHC 37.3 (*)    All other components within normal limits  RETICULOCYTES - Abnormal; Notable for the following components:   Immature Retic Fract 23.4 (*)    All other components within normal limits  I-STAT BETA HCG BLOOD, ED ([facility], [facility], [facility] ONLY)    EKG None  Radiology No results found.  Procedures Procedures   Medications Ordered in  ED Medications  HYDROmorphone (DILAUDID) injection 1 mg (1 mg Intravenous Given 07/26/21 0042)  HYDROmorphone (DILAUDID) injection 1 mg (1 mg Intravenous Given 07/26/21 0116)  ketorolac (TORADOL) 15 MG/ML injection 15 mg (15 mg Intravenous Given 07/26/21 0042)  HYDROmorphone (DILAUDID) injection 1 mg (1 mg Intravenous Given 07/26/21 0243)    ED Course  I have reviewed the triage vital signs and the nursing notes.  Pertinent labs & imaging results that were available during my care of the patient were reviewed by me and considered in my medical decision making (see chart for details).    MDM Rules/Calculators/A&P                           Patient to ED with pain c/w sickle cell crisis affecting bilateral LE's. No other symptoms.   Labs arec/w baseline. No fever. VSS. Pain improved with medications provided in the ED. She states she feels better and is comfortable with discharge home.    Final Clinical Impression(s) / ED Diagnoses Final diagnoses:  None   Sickle cell anemia with pain  Rx / DC Orders ED Discharge Orders     None        Charlann Lange, PA-C 07/26/21 0248    Orpah Greek, MD 07/26/21 [phone]

## 2021-07-26 NOTE — Discharge Instructions (Addendum)
Follow up with your doctor as planned for further management of sickle cell disease.

## 2021-07-28 ENCOUNTER — Other Ambulatory Visit: Payer: Self-pay

## 2021-07-28 ENCOUNTER — Emergency Department (HOSPITAL_COMMUNITY)
Admission: EM | Admit: 2021-07-28 | Discharge: 2021-07-29 | Disposition: A | Discharge status: Home / Self Care | Payer: Medicaid Other | Attending: Emergency Medicine | Admitting: Emergency Medicine

## 2021-07-28 ENCOUNTER — Encounter (HOSPITAL_COMMUNITY): Payer: Self-pay

## 2021-07-28 DIAGNOSIS — Z87891 Personal history of nicotine dependence: Secondary | ICD-10-CM | POA: Insufficient documentation

## 2021-07-28 DIAGNOSIS — D5700 Hb-SS disease with crisis, unspecified: Secondary | ICD-10-CM | POA: Diagnosis present

## 2021-07-28 NOTE — ED Triage Notes (Signed)
Pt BIB EMS. Pt complains of SCC since 3 days ago when she was last here. Pain is mostly in her legs. Pain 10/10.

## 2021-07-29 ENCOUNTER — Telehealth: Payer: Self-pay | Admitting: Clinical

## 2021-07-29 ENCOUNTER — Ambulatory Visit (HOSPITAL_COMMUNITY): Admission: RE | Admit: 2021-07-29 | Payer: Medicaid Other | Source: Ambulatory Visit

## 2021-07-29 LAB — CBC WITH DIFFERENTIAL/PLATELET
Abs Immature Granulocytes: 0.12 10*3/uL — ABNORMAL HIGH (ref 0.00–0.07)
Basophils Absolute: 0.1 10*3/uL (ref 0.0–0.1)
Basophils Relative: 1 %
Eosinophils Absolute: 0.6 10*3/uL — ABNORMAL HIGH (ref 0.0–0.5)
Eosinophils Relative: 5 %
HCT: 25 % — ABNORMAL LOW (ref 36.0–46.0)
Hemoglobin: 9.3 g/dL — ABNORMAL LOW (ref 12.0–15.0)
Immature Granulocytes: 1 %
Lymphocytes Relative: 31 %
Lymphs Abs: 3.8 10*3/uL (ref 0.7–4.0)
MCH: 27.3 pg (ref 26.0–34.0)
MCHC: 37.2 g/dL — ABNORMAL HIGH (ref 30.0–36.0)
MCV: 73.3 fL — ABNORMAL LOW (ref 80.0–100.0)
Monocytes Absolute: 1 10*3/uL (ref 0.1–1.0)
Monocytes Relative: 8 %
Neutro Abs: 6.6 10*3/uL (ref 1.7–7.7)
Neutrophils Relative %: 54 %
Platelets: 299 10*3/uL (ref 150–400)
RBC: 3.41 MIL/uL — ABNORMAL LOW (ref 3.87–5.11)
RDW: 15.6 % — ABNORMAL HIGH (ref 11.5–15.5)
WBC: 12.1 10*3/uL — ABNORMAL HIGH (ref 4.0–10.5)
nRBC: 2.3 % — ABNORMAL HIGH (ref 0.0–0.2)

## 2021-07-29 LAB — I-STAT BETA HCG BLOOD, ED (MC, WL, AP ONLY): I-stat hCG, quantitative: <5 m[IU]/mL (ref <5)

## 2021-07-29 MED ORDER — KETOROLAC TROMETHAMINE 30 MG/ML IJ SOLN
30.0000 mg | Freq: Once | INTRAMUSCULAR | Status: AC
  Administered 2021-07-29: 30 mg via INTRAMUSCULAR
  Filled 2021-07-29: qty 1

## 2021-07-29 MED ORDER — HYDROMORPHONE HCL 1 MG/ML IJ SOLN
1.0000 mg | INTRAMUSCULAR | Status: AC
  Administered 2021-07-29 (×2): 1 mg via SUBCUTANEOUS
  Filled 2021-07-29 (×2): qty 1

## 2021-07-29 MED ORDER — DIPHENHYDRAMINE HCL 25 MG PO CAPS
50.0000 mg | ORAL_CAPSULE | Freq: Once | ORAL | Status: AC
  Administered 2021-07-29: 50 mg via ORAL
  Filled 2021-07-29: qty 2

## 2021-07-29 NOTE — Discharge Instructions (Addendum)
Follow up with your doctor as planned for regular pain control prescriptions.   Return in the morning for scheduled doppler study to insure there is no deep vein clot in the leg.

## 2021-07-29 NOTE — ED Provider Notes (Signed)
[facility] DEPT Provider Note   CSN: [id number] Arrival date & time: 07/28/21  2219     History Chief Complaint  Patient presents with   Sickle Cell Pain Crisis    Hannah Hawkins is a 35 y.o. female.  Patient to ED with complaint of back and bilateral LE pain c/w sickle cell crisis. No chest pain, fever, cough or SOB. She is pending appointment later in the month to review and restart pain medication regimen by primary care but is currently out of pain control at home.   The history is provided by the patient. No language interpreter was used.  Sickle Cell Pain Crisis Associated symptoms: no fever       Past Medical History:  Diagnosis Date   Sickle cell anemia ([place])     Patient Active Problem List   Diagnosis Date Noted   Cholelithiasis 06/02/2021   Epigastric pain    Lactic acidosis    RUQ abdominal pain    Rhabdomyolysis 08/07/2020   Sickle cell crisis ([place]) 08/06/2020   Elevated LFTs 08/06/2020   Cyst of spleen 08/06/2020   Leukocytosis 08/06/2020   SIRS (systemic inflammatory response syndrome) ([place]) 08/06/2020   Non-intractable vomiting    Joint pain in fingers of both hands 06/11/2020   Hypokalemia 07/17/2019   GERD (gastroesophageal reflux disease) 05/29/2019   History of cocaine use 05/18/2019   Chronic pain syndrome    Cocaine use    Sickle cell pain crisis ([place]) 11/14/2017   Anemia of chronic disease 11/13/2017   Sinusitis, chronic 11/13/2017   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    Past Surgical History:  Procedure Laterality Date   CHOLECYSTECTOMY N/A 06/03/2021   Procedure: LAPAROSCOPIC CHOLECYSTECTOMY;  Surgeon: Coralie Keens, MD;  [facility];  Service: General;  Laterality: N/A;   EYE SURGERY  03/09/2017   left eye   EYE SURGERY Right 09/2019   right hemerage      OB History     Gravida  2   Para      Term      Preterm      AB      Living  1      SAB      IAB      Ectopic       Multiple      Live Births              Family History  Problem Relation Age of Onset   Diabetes Father     Social History   Tobacco Use   Smoking status: Former    Types: Cigarettes   Smokeless tobacco: Never  Vaping Use   Vaping Use: Never used  Substance Use Topics   Alcohol use: Not Currently   Drug use: No    Home Medications Prior to Admission medications   Medication Sig Start Date End Date Taking? Authorizing Provider  citalopram (CELEXA) 20 MG tablet Take 1 tablet (20 mg total) by mouth daily. 04/16/21 04/16/22  Vevelyn Francois, NP  DULoxetine (CYMBALTA) 30 MG capsule Take 1 capsule (30 mg total) by mouth 2 (two) times daily. 06/29/21 06/29/22  Vevelyn Francois, NP  ibuprofen (ADVIL) 800 MG tablet Take 1 tablet (800 mg total) by mouth 3 (three) times daily. 09/25/20 09/25/21  Vevelyn Francois, NP  lidocaine (LIDODERM) 5 % Place 1 patch onto the skin daily. Remove & Discard patch within 12 hours or as directed by MD 06/19/21   Vevelyn Francois,  NP  montelukast (SINGULAIR) 10 MG tablet Take 1 tablet (10 mg total) by mouth at bedtime. 05/02/20   Vevelyn Francois, NP  NARCAN 4 MG/0.1ML LIQD nasal spray kit Place 4 mg into the nose once as needed (overdose). 12/22/17   [provider]  oxyCODONE (ROXICODONE) 15 MG immediate release tablet Take 1 tablet (15 mg total) by mouth every 6 (six) hours as needed for up to 15 days for pain (moderate to severe pain). 06/15/21 06/30/21  Vevelyn Francois, NP  tiZANidine (ZANAFLEX) 2 MG tablet Take 1 tablet (2 mg total) by mouth every 6 (six) hours as needed for muscle spasms. 05/28/21   Vevelyn Francois, NP    Allergies    Penicillins  Review of Systems   Review of Systems  Constitutional:  Negative for chills and fever.  HENT: Negative.    Respiratory: Negative.    Cardiovascular: Negative.   Gastrointestinal: Negative.   Musculoskeletal:        See HPI.  Skin: Negative.   Neurological: Negative.    Physical Exam Updated  Vital Signs BP (!) 103/47   Pulse 84   Temp 99.2 F (37.3 C) (Oral)   Resp 20   SpO2 100%   Physical Exam Vitals and nursing note reviewed.  Constitutional:      Appearance: She is well-developed.  HENT:     Head: Normocephalic.  Cardiovascular:     Rate and Rhythm: Normal rate and regular rhythm.     Heart sounds: No murmur heard. Pulmonary:     Effort: Pulmonary effort is normal.     Breath sounds: Normal breath sounds. No wheezing, rhonchi or rales.  Abdominal:     General: Bowel sounds are normal.     Palpations: Abdomen is soft.     Tenderness: There is no abdominal tenderness. There is no guarding or rebound.  Musculoskeletal:        General: No swelling. Normal range of motion.     Cervical back: Normal range of motion and neck supple.     Comments: Bilateral LE's with generalized tenderness, worse over right lateral lower extremity. No swelling or redness. Pulses present distally. FROM without limitation.  Skin:    General: Skin is warm and dry.  Neurological:     General: No focal deficit present.     Mental Status: She is alert and oriented to person, place, and time.     Sensory: No sensory deficit.    ED Results / Procedures / Treatments   Labs (all labs ordered are listed, but only abnormal results are displayed) Labs Reviewed - No data to display  EKG None  Radiology No results found.  Procedures Procedures   Medications Ordered in ED Medications  HYDROmorphone (DILAUDID) injection 1 mg (has no administration in time range)  ketorolac (TORADOL) 30 MG/ML injection 30 mg (has no administration in time range)  diphenhydrAMINE (BENADRYL) capsule 50 mg (has no administration in time range)    ED Course  I have reviewed the triage vital signs and the nursing notes.  Pertinent labs & imaging results that were available during my care of the patient were reviewed by me and considered in my medical decision making (see chart for details).    MDM  Rules/Calculators/A&P                           Patient with h/o sickle cell disease presents with persistent pain in bilateral legs  and back, c/w history of crisis. No fever, chest pain, SOB or vomiting.   Nontoxic in appearance. Appears uncomfortable. Protocol pain dosing scheduled ordered. Will re-evaluate after she receives both doses. VSS, afebrile, no hypoxia. Hemoglobin chekced and is 9.3, well above need to consider transfusion.   She reports feeling better after medications. On recheck, she is somnolent, falling asleep in mid-sentence. Easy to waken. VSS. She is felt appropriate for discharge home.  She expresses concern for R>L LE pain and feels the leg swells. Concern for DVT. Outpatient DVT study ordered for later this morning. Patient was updated and this appointment was discussed in detail.   Final Clinical Impression(s) / ED Diagnoses Final diagnoses:  None   Sickle cell anemia with pain Right LE pain  Rx / DC Orders ED Discharge Orders     None        Dennie Bible 56/70/14 1030    Delora Fuel, MD 13/14/38 [phone]

## 2021-07-29 NOTE — Telephone Encounter (Signed)
[facility] General Follow Up Note  07/29/2021 Name: Hannah Hawkins MRN: [id number] DOB: [birth year] Hannah Hawkins is a 35 y.o. year old female who sees Barbette Merino, NP for primary care. LCSW was initially consulted to assist patient with community resources.  Interpreter: No.   Interpreter Name & Language: none  Assessment: Patient experiencing financial difficulties due to low income. Patient also had a positive on most recent UDS.  Ongoing Intervention: Today CSW called patient to check in after concern expressed by PCP regarding positive UDS. Assessed patient's status with filing disability claim with [facility]. Patient reported she had not heard from [facility] for her scheduled appointment and has not followed up with them. CSW to check with [facility] on if a new referral is needed, as it has been several months.   Patient also indicated she planned to come in to the [facility]) this week for new urine drug screen.   Review of patient status, including review of consultants reports, relevant laboratory and other test results, and collaboration with appropriate care team members and the patient's provider was performed as part of comprehensive patient evaluation and provision of services.    Abigail Butts, LCSW [facility] [phone]

## 2021-07-31 ENCOUNTER — Telehealth: Payer: Self-pay | Admitting: Clinical

## 2021-07-31 ENCOUNTER — Other Ambulatory Visit: Payer: Self-pay

## 2021-07-31 DIAGNOSIS — G894 Chronic pain syndrome: Secondary | ICD-10-CM

## 2021-07-31 DIAGNOSIS — Z79891 Long term (current) use of opiate analgesic: Secondary | ICD-10-CM

## 2021-07-31 NOTE — Telephone Encounter (Signed)
[facility] General Follow Up Note  07/31/2021 Name: Hannah Hawkins MRN: [id number] DOB: 04-18-1986 Hannah Hawkins is a 35 y.o. year old female who sees Barbette Merino, NP for primary care. LCSW was initially consulted to assist patient with community resources.  Interpreter: No.   Interpreter Name & Language: none  Assessment: Patient experiencing financial difficulties due to low income.   Ongoing Intervention: CSW confirmed with [facility] that they still have patient's referral from May and patient may just call to schedule a new appointment for assistance with a disability claim. Advised patient of this and the staff member's extension that she should contact.   Review of patient status, including review of consultants reports, relevant laboratory and other test results, and collaboration with appropriate care team members and the patient's provider was performed as part of comprehensive patient evaluation and provision of services.    Abigail Butts, LCSW [facility] [phone]

## 2021-07-31 NOTE — Progress Notes (Signed)
Patient came in to give urine for drug screen.

## 2021-08-06 LAB — DRUG SCREEN 764883 11+OXYCO+ALC+CRT-BUND
Amphetamines, Urine: NEGATIVE ng/mL
BENZODIAZ UR QL: NEGATIVE ng/mL
Barbiturate: NEGATIVE ng/mL
Cocaine (Metabolite): NEGATIVE ng/mL
Creatinine: 45.8 mg/dL (ref 20.0–300.0)
Ethanol: NEGATIVE %
Meperidine: NEGATIVE ng/mL
Methadone Screen, Urine: NEGATIVE ng/mL
OPIATE SCREEN URINE: NEGATIVE ng/mL
Oxycodone/Oxymorphone, Urine: NEGATIVE ng/mL
Phencyclidine: NEGATIVE ng/mL
Propoxyphene: NEGATIVE ng/mL
Tramadol: NEGATIVE ng/mL
pH, Urine: 7.2 (ref 4.5–8.9)

## 2021-08-06 LAB — CANNABINOID CONFIRMATION, UR
CANNABINOIDS: POSITIVE — AB
Carboxy THC GC/MS Conf: 17 ng/mL

## 2021-08-10 ENCOUNTER — Telehealth: Payer: Self-pay

## 2021-08-10 NOTE — Telephone Encounter (Signed)
Patient needs to be scheduled for lab urine drug screen.

## 2021-08-10 NOTE — Telephone Encounter (Signed)
-----   Message from Barbette Merino, NP sent at 08/10/2021 11:14 AM EDT ----- Call Ms. Lampi and let her know that we had come back in for another urine drug screen thanks as soon as possible

## 2021-08-13 ENCOUNTER — Emergency Department (HOSPITAL_COMMUNITY)
Admission: EM | Admit: 2021-08-13 | Discharge: 2021-08-14 | Disposition: A | Discharge status: Home / Self Care | Payer: Medicaid Other | Attending: Emergency Medicine | Admitting: Emergency Medicine

## 2021-08-13 ENCOUNTER — Encounter (HOSPITAL_COMMUNITY): Payer: Self-pay | Admitting: Emergency Medicine

## 2021-08-13 ENCOUNTER — Other Ambulatory Visit: Payer: Self-pay | Admitting: Nurse Practitioner

## 2021-08-13 ENCOUNTER — Other Ambulatory Visit: Payer: Self-pay

## 2021-08-13 DIAGNOSIS — D5700 Hb-SS disease with crisis, unspecified: Secondary | ICD-10-CM | POA: Diagnosis present

## 2021-08-13 DIAGNOSIS — Z87891 Personal history of nicotine dependence: Secondary | ICD-10-CM | POA: Diagnosis not present

## 2021-08-13 LAB — COMPREHENSIVE METABOLIC PANEL
ALT: 11 U/L (ref 0–44)
AST: 18 U/L (ref 15–41)
Albumin: 3.5 g/dL (ref 3.5–5.0)
Alkaline Phosphatase: 39 U/L (ref 38–126)
Anion gap: 5 (ref 5–15)
BUN: 10 mg/dL (ref 6–20)
CO2: 26 mmol/L (ref 22–32)
Calcium: 8.3 mg/dL — ABNORMAL LOW (ref 8.9–10.3)
Chloride: 108 mmol/L (ref 98–111)
Creatinine, Ser: 0.73 mg/dL (ref 0.44–1.00)
GFR, Estimated: >60 mL/min (ref >60)
Glucose, Bld: 94 mg/dL (ref 70–99)
Potassium: 3.6 mmol/L (ref 3.5–5.1)
Sodium: 139 mmol/L (ref 135–145)
Total Bilirubin: 0.6 mg/dL (ref 0.3–1.2)
Total Protein: 6.2 g/dL — ABNORMAL LOW (ref 6.5–8.1)

## 2021-08-13 LAB — CBC WITH DIFFERENTIAL/PLATELET
Abs Immature Granulocytes: 0.02 10*3/uL (ref 0.00–0.07)
Basophils Absolute: 0.1 10*3/uL (ref 0.0–0.1)
Basophils Relative: 1 %
Eosinophils Absolute: 1 10*3/uL — ABNORMAL HIGH (ref 0.0–0.5)
Eosinophils Relative: 11 %
HCT: 26.1 % — ABNORMAL LOW (ref 36.0–46.0)
Hemoglobin: 9.7 g/dL — ABNORMAL LOW (ref 12.0–15.0)
Immature Granulocytes: 0 %
Lymphocytes Relative: 35 %
Lymphs Abs: 3.1 10*3/uL (ref 0.7–4.0)
MCH: 27.2 pg (ref 26.0–34.0)
MCHC: 37.2 g/dL — ABNORMAL HIGH (ref 30.0–36.0)
MCV: 73.3 fL — ABNORMAL LOW (ref 80.0–100.0)
Monocytes Absolute: 0.8 10*3/uL (ref 0.1–1.0)
Monocytes Relative: 9 %
Neutro Abs: 4 10*3/uL (ref 1.7–7.7)
Neutrophils Relative %: 44 %
Platelets: 329 10*3/uL (ref 150–400)
RBC: 3.56 MIL/uL — ABNORMAL LOW (ref 3.87–5.11)
RDW: 14.4 % (ref 11.5–15.5)
WBC: 8.9 10*3/uL (ref 4.0–10.5)
nRBC: 0.8 % — ABNORMAL HIGH (ref 0.0–0.2)

## 2021-08-13 LAB — I-STAT BETA HCG BLOOD, ED (MC, WL, AP ONLY): I-stat hCG, quantitative: <5 m[IU]/mL (ref <5)

## 2021-08-13 LAB — RETICULOCYTES
Immature Retic Fract: 21.3 % — ABNORMAL HIGH (ref 2.3–15.9)
RBC.: 3.55 MIL/uL — ABNORMAL LOW (ref 3.87–5.11)
Retic Count, Absolute: 106.9 10*3/uL (ref 19.0–186.0)
Retic Ct Pct: 3 % (ref 0.4–3.1)

## 2021-08-13 MED ORDER — DULOXETINE HCL 30 MG PO CPEP: 30.0000 mg | ORAL_CAPSULE | Freq: Two times a day (BID) | ORAL | 0 refills | Status: DC

## 2021-08-13 MED ORDER — DIPHENHYDRAMINE HCL 25 MG PO CAPS
25.0000 mg | ORAL_CAPSULE | ORAL | Status: DC | PRN
  Administered 2021-08-13: 50 mg via ORAL
  Filled 2021-08-13: qty 2

## 2021-08-13 MED ORDER — SODIUM CHLORIDE 0.45 % IV SOLN: INTRAVENOUS | Status: DC

## 2021-08-13 MED ORDER — HYDROMORPHONE HCL 2 MG/ML IJ SOLN
2.0000 mg | INTRAMUSCULAR | Status: AC
  Administered 2021-08-13: 2 mg via INTRAVENOUS
  Filled 2021-08-13: qty 1

## 2021-08-13 MED ORDER — DIPHENHYDRAMINE HCL 50 MG/ML IJ SOLN
25.0000 mg | Freq: Once | INTRAMUSCULAR | Status: AC
  Administered 2021-08-13: 25 mg via INTRAVENOUS
  Filled 2021-08-13: qty 1

## 2021-08-13 MED ORDER — ONDANSETRON HCL 4 MG/2ML IJ SOLN
4.0000 mg | INTRAMUSCULAR | Status: DC | PRN
  Filled 2021-08-13: qty 2

## 2021-08-13 NOTE — ED Provider Notes (Signed)
[place] DEPT Provider Note   CSN: [id number] Arrival date & time: 08/13/21  2123     History Chief Complaint  Patient presents with   Sickle Cell Pain Crisis    Hannah Hawkins is a 35 y.o. female.  35 year old female who has history of sickle cell disease who presents with her usual pain crisis.  Seen by myself last month for same.  States that she is been out of her home medications.  No chest pain in her upper extremities.  Denies any trauma.  No cough or congestion.  No vomiting or diarrhea.  Is unsure of what exacerbated her current crisis      Past Medical History:  Diagnosis Date   Sickle cell anemia ([place])     Patient Active Problem List   Diagnosis Date Noted   Cholelithiasis 06/02/2021   Epigastric pain    Lactic acidosis    RUQ abdominal pain    Rhabdomyolysis 08/07/2020   Sickle cell crisis ([place]) 08/06/2020   Elevated LFTs 08/06/2020   Cyst of spleen 08/06/2020   Leukocytosis 08/06/2020   SIRS (systemic inflammatory response syndrome) (HCC) 08/06/2020   Non-intractable vomiting    Joint pain in fingers of both hands 06/11/2020   Hypokalemia 07/17/2019   GERD (gastroesophageal reflux disease) 05/29/2019   History of cocaine use 05/18/2019   Chronic pain syndrome    Cocaine use    Sickle cell pain crisis ([place]) 11/14/2017   Anemia of chronic disease 11/13/2017   Sinusitis, chronic 11/13/2017   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    Past Surgical History:  Procedure Laterality Date   CHOLECYSTECTOMY N/A 06/03/2021   Procedure: LAPAROSCOPIC CHOLECYSTECTOMY;  Surgeon: Coralie Keens, MD;  Location: [facility];  Service: General;  Laterality: N/A;   EYE SURGERY  03/09/2017   left eye   EYE SURGERY Right 09/2019   right hemerage      OB History     Gravida  2   Para      Term      Preterm      AB      Living  1      SAB      IAB      Ectopic      Multiple      Live Births               Family History  Problem Relation Age of Onset   Diabetes Father     Social History   Tobacco Use   Smoking status: Former    Types: Cigarettes   Smokeless tobacco: Never  Vaping Use   Vaping Use: Never used  Substance Use Topics   Alcohol use: Not Currently   Drug use: Not Currently    Types: Marijuana    Home Medications Prior to Admission medications   Medication Sig Start Date End Date Taking? Authorizing Provider  citalopram (CELEXA) 20 MG tablet Take 1 tablet (20 mg total) by mouth daily. 04/16/21 04/16/22  Vevelyn Francois, NP  DULoxetine (CYMBALTA) 30 MG capsule Take 1 capsule (30 mg total) by mouth 2 (two) times daily. 08/13/21 08/13/22  Vevelyn Francois, NP  ibuprofen (ADVIL) 800 MG tablet Take 1 tablet (800 mg total) by mouth 3 (three) times daily. 09/25/20 09/25/21  Vevelyn Francois, NP  lidocaine (LIDODERM) 5 % Place 1 patch onto the skin daily. Remove & Discard patch within 12 hours or as directed by MD 06/19/21   Vevelyn Francois, NP  montelukast (SINGULAIR) 10 MG tablet Take 1 tablet (10 mg total) by mouth at bedtime. 05/02/20   Vevelyn Francois, NP  NARCAN 4 MG/0.1ML LIQD nasal spray kit Place 4 mg into the nose once as needed (overdose). 12/22/17   [provider]  tiZANidine (ZANAFLEX) 2 MG tablet Take 1 tablet (2 mg total) by mouth every 6 (six) hours as needed for muscle spasms. 05/28/21   Vevelyn Francois, NP    Allergies    Penicillins  Review of Systems   Review of Systems  All other systems reviewed and are negative.  Physical Exam Updated Vital Signs BP (!) 106/59 (BP Location: Left Arm)   Pulse 75   Temp 97.9 F (36.6 C) (Oral)   Resp 12   Ht 1.6 m (5' 3")   Wt 61.2 kg   SpO2 100%   BMI 23.91 kg/m   Physical Exam Vitals and nursing note reviewed.  Constitutional:      General: She is not in acute distress.    Appearance: Normal appearance. She is well-developed. She is not toxic-appearing.  HENT:     Head: Normocephalic and atraumatic.   Eyes:     General: Lids are normal.     Conjunctiva/sclera: Conjunctivae normal.     Pupils: Pupils are equal, round, and reactive to light.  Neck:     Thyroid: No thyroid mass.     Trachea: No tracheal deviation.  Cardiovascular:     Rate and Rhythm: Normal rate and regular rhythm.     Heart sounds: Normal heart sounds. No murmur heard.   No gallop.  Pulmonary:     Effort: Pulmonary effort is normal. No respiratory distress.     Breath sounds: Normal breath sounds. No stridor. No decreased breath sounds, wheezing, rhonchi or rales.  Abdominal:     General: There is no distension.     Palpations: Abdomen is soft.     Tenderness: There is no abdominal tenderness. There is no rebound.  Musculoskeletal:        General: No tenderness. Normal range of motion.     Cervical back: Normal range of motion and neck supple.  Skin:    General: Skin is warm and dry.     Findings: No abrasion or rash.  Neurological:     Mental Status: She is alert and oriented to person, place, and time. Mental status is at baseline.     GCS: GCS eye subscore is 4. GCS verbal subscore is 5. GCS motor subscore is 6.     Cranial Nerves: Cranial nerves are intact. No cranial nerve deficit.     Sensory: No sensory deficit.     Motor: Motor function is intact.  Psychiatric:        Attention and Perception: Attention normal.        Speech: Speech normal.        Behavior: Behavior normal.    ED Results / Procedures / Treatments   Labs (all labs ordered are listed, but only abnormal results are displayed) Labs Reviewed  COMPREHENSIVE METABOLIC PANEL  CBC WITH DIFFERENTIAL/PLATELET  RETICULOCYTES  I-STAT BETA HCG BLOOD, ED ([facility], [facility], [facility] ONLY)    EKG None  Radiology No results found.  Procedures Procedures   Medications Ordered in ED Medications  0.45 % sodium chloride infusion (has no administration in time range)  HYDROmorphone (DILAUDID) injection 2 mg (has no administration in time range)   HYDROmorphone (DILAUDID) injection 2 mg (has no administration in time  range)  diphenhydrAMINE (BENADRYL) capsule 25-50 mg (has no administration in time range)  ondansetron (ZOFRAN) injection 4 mg (has no administration in time range)    ED Course  I have reviewed the triage vital signs and the nursing notes.  Pertinent labs & imaging results that were available during my care of the patient were reviewed by me and considered in my medical decision making (see chart for details).    MDM Rules/Calculators/A&P                           Patient medicated for sickle cell pain crisis versus chronic pain here.  Feels better.  We will follow-up in the clinic Final Clinical Impression(s) / ED Diagnoses Final diagnoses:  None    Rx / DC Orders ED Discharge Orders     None        Lacretia Leigh, MD 08/13/21 2301

## 2021-08-13 NOTE — ED Triage Notes (Signed)
Patient arrives complaining of generalized sickle cell pain that began today. Patient states that she is unable to get her pain medication due to needing to retake her UDS for her PCP.   150 mcg fentanyl given (50 mcg x3) per EMS.

## 2021-08-14 ENCOUNTER — Other Ambulatory Visit: Payer: Self-pay | Admitting: Nurse Practitioner

## 2021-08-14 ENCOUNTER — Telehealth: Payer: Self-pay | Admitting: Nurse Practitioner

## 2021-08-14 MED ORDER — MONTELUKAST SODIUM 10 MG PO TABS: 10.0000 mg | ORAL_TABLET | Freq: Every day | ORAL | 3 refills | Status: DC

## 2021-08-14 MED ORDER — FLUTICASONE PROPIONATE 50 MCG/ACT NA SUSP: 2.0000 | Freq: Every day | NASAL | 2 refills | Status: DC

## 2021-08-14 NOTE — Telephone Encounter (Signed)
Patient is requesting nasal spray, allergy medication and refill on pain medication. Pharmacy: [facility]  Scheduling patient for virtual visit to discuss concerns over allergy meds.

## 2021-08-17 NOTE — Telephone Encounter (Signed)
Left voicemail to make appointment  for lab.

## 2021-09-07 ENCOUNTER — Other Ambulatory Visit: Payer: Self-pay

## 2021-09-07 ENCOUNTER — Other Ambulatory Visit: Payer: Self-pay | Admitting: Nurse Practitioner

## 2021-09-07 ENCOUNTER — Telehealth (INDEPENDENT_AMBULATORY_CARE_PROVIDER_SITE_OTHER): Payer: Medicaid Other | Admitting: Nurse Practitioner

## 2021-09-07 ENCOUNTER — Ambulatory Visit: Payer: Medicaid Other

## 2021-09-07 ENCOUNTER — Telehealth: Payer: Medicaid Other | Admitting: Nurse Practitioner

## 2021-09-07 ENCOUNTER — Encounter: Payer: Self-pay | Admitting: Nurse Practitioner

## 2021-09-07 DIAGNOSIS — Z79891 Long term (current) use of opiate analgesic: Secondary | ICD-10-CM | POA: Diagnosis not present

## 2021-09-07 DIAGNOSIS — D571 Sickle-cell disease without crisis: Secondary | ICD-10-CM | POA: Diagnosis not present

## 2021-09-07 DIAGNOSIS — Z30011 Encounter for initial prescription of contraceptive pills: Secondary | ICD-10-CM

## 2021-09-07 DIAGNOSIS — G894 Chronic pain syndrome: Secondary | ICD-10-CM | POA: Diagnosis not present

## 2021-09-07 DIAGNOSIS — D5720 Sickle-cell/Hb-C disease without crisis: Secondary | ICD-10-CM

## 2021-09-07 DIAGNOSIS — R110 Nausea: Secondary | ICD-10-CM

## 2021-09-07 DIAGNOSIS — R600 Localized edema: Secondary | ICD-10-CM | POA: Diagnosis not present

## 2021-09-07 DIAGNOSIS — F1490 Cocaine use, unspecified, uncomplicated: Secondary | ICD-10-CM

## 2021-09-07 DIAGNOSIS — K219 Gastro-esophageal reflux disease without esophagitis: Secondary | ICD-10-CM

## 2021-09-07 MED ORDER — PANTOPRAZOLE SODIUM 40 MG PO TBEC: 40.0000 mg | DELAYED_RELEASE_TABLET | Freq: Every day | ORAL | 11 refills | Status: DC

## 2021-09-07 MED ORDER — OXYCODONE HCL 15 MG PO TABS: 15.0000 mg | ORAL_TABLET | Freq: Four times a day (QID) | ORAL | 0 refills | Status: DC | PRN

## 2021-09-07 MED ORDER — ONDANSETRON HCL 8 MG PO TABS: 16.0000 mg | ORAL_TABLET | Freq: Three times a day (TID) | ORAL | 2 refills | Status: AC | PRN

## 2021-09-07 MED ORDER — MIRTAZAPINE 15 MG PO TABS: 15.0000 mg | ORAL_TABLET | Freq: Every day | ORAL | 11 refills | Status: DC
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
## 2021-09-08 LAB — DRUG SCREEN 764883 11+OXYCO+ALC+CRT-BUND
Amphetamines, Urine: NEGATIVE ng/mL
BENZODIAZ UR QL: NEGATIVE ng/mL
Barbiturate: NEGATIVE ng/mL
Cannabinoid Quant, Ur: NEGATIVE ng/mL
Cocaine (Metabolite): NEGATIVE ng/mL
Creatinine: 83 mg/dL (ref 20.0–300.0)
Ethanol: NEGATIVE %
Meperidine: NEGATIVE ng/mL
Methadone Screen, Urine: NEGATIVE ng/mL
OPIATE SCREEN URINE: NEGATIVE ng/mL
Oxycodone/Oxymorphone, Urine: NEGATIVE ng/mL
Phencyclidine: NEGATIVE ng/mL
Propoxyphene: NEGATIVE ng/mL
Tramadol: NEGATIVE ng/mL
pH, Urine: 7.3 (ref 4.5–8.9)

## 2021-09-08 MED ORDER — NORETHINDRONE 0.35 MG PO TABS: 1.0000 | ORAL_TABLET | Freq: Every day | ORAL | 11 refills | Status: DC

## 2021-09-20 ENCOUNTER — Other Ambulatory Visit: Payer: Self-pay

## 2021-09-20 DIAGNOSIS — G894 Chronic pain syndrome: Secondary | ICD-10-CM

## 2021-09-20 DIAGNOSIS — D5720 Sickle-cell/Hb-C disease without crisis: Secondary | ICD-10-CM

## 2021-09-22 ENCOUNTER — Telehealth: Payer: Self-pay

## 2021-09-22 NOTE — Telephone Encounter (Signed)
Oxycodone  Also she asking  for long term pain med.

## 2021-09-24 ENCOUNTER — Other Ambulatory Visit: Payer: Self-pay | Admitting: Internal Medicine

## 2021-09-24 ENCOUNTER — Telehealth: Payer: Self-pay

## 2021-09-24 DIAGNOSIS — D5720 Sickle-cell/Hb-C disease without crisis: Secondary | ICD-10-CM

## 2021-09-24 DIAGNOSIS — G894 Chronic pain syndrome: Secondary | ICD-10-CM

## 2021-09-24 MED ORDER — OXYCODONE HCL 15 MG PO TABS: 15.0000 mg | ORAL_TABLET | Freq: Four times a day (QID) | ORAL | 0 refills | Status: DC | PRN

## 2021-09-24 NOTE — Telephone Encounter (Addendum)
Oxycodone tablet prior authorization initiated via [employer]. Confirmation #: [id number]

## 2021-09-24 NOTE — Telephone Encounter (Signed)
Oxycodone  °

## 2021-09-26 ENCOUNTER — Other Ambulatory Visit: Payer: Self-pay

## 2021-09-26 ENCOUNTER — Encounter (HOSPITAL_COMMUNITY): Payer: Self-pay | Admitting: Emergency Medicine

## 2021-09-26 ENCOUNTER — Emergency Department (HOSPITAL_COMMUNITY)
Admission: EM | Admit: 2021-09-26 | Discharge: 2021-09-26 | Disposition: A | Discharge status: Home / Self Care | Payer: Medicaid Other | Attending: Emergency Medicine | Admitting: Emergency Medicine

## 2021-09-26 DIAGNOSIS — R462 Strange and inexplicable behavior: Secondary | ICD-10-CM

## 2021-09-26 DIAGNOSIS — D5700 Hb-SS disease with crisis, unspecified: Secondary | ICD-10-CM | POA: Diagnosis not present

## 2021-09-26 DIAGNOSIS — Z87891 Personal history of nicotine dependence: Secondary | ICD-10-CM | POA: Insufficient documentation
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
## 2021-09-26 NOTE — Discharge Instructions (Addendum)
Call your primary care doctor or specialist as discussed in the next 2-3 days.   Return immediately back to the ER if:  Your symptoms worsen within the next 12-24 hours. You develop new symptoms such as new fevers, persistent vomiting, new pain, shortness of breath, or new weakness or numbness, or if you have any other concerns.  

## 2021-09-28 NOTE — Telephone Encounter (Signed)
Prior Approval #:[id number] [place] Type:PHARMACYRecipient:Ariellah A LAMPTEYRecipient OZ:[id number] RBilling Provider:Billing Provider NG:[id number] Provider Name:CRYSTAL Dolan Amen Provider MW:[phone] Submission Date:09/25/2021  Status:APPROVEDEffective Begin Date:10/07/2022Effective End Date:04/05/2023Payer:[place] [employer] HEALTH BENEFITS# of Attachments:1

## 2021-09-29 NOTE — Telephone Encounter (Signed)
Pt is aware that refills have been sent and verbalized understanding of information given.

## 2021-09-30 ENCOUNTER — Ambulatory Visit: Payer: Medicaid Other | Admitting: Nurse Practitioner

## 2021-10-05 ENCOUNTER — Other Ambulatory Visit: Payer: Self-pay

## 2021-10-05 DIAGNOSIS — G894 Chronic pain syndrome: Secondary | ICD-10-CM

## 2021-10-05 DIAGNOSIS — D5720 Sickle-cell/Hb-C disease without crisis: Secondary | ICD-10-CM

## 2021-10-09 ENCOUNTER — Other Ambulatory Visit: Payer: Self-pay | Admitting: Nurse Practitioner

## 2021-10-09 DIAGNOSIS — D5720 Sickle-cell/Hb-C disease without crisis: Secondary | ICD-10-CM

## 2021-10-09 DIAGNOSIS — G894 Chronic pain syndrome: Secondary | ICD-10-CM

## 2021-10-09 MED ORDER — OXYCODONE HCL 15 MG PO TABS: 15.0000 mg | ORAL_TABLET | Freq: Four times a day (QID) | ORAL | 0 refills | Status: DC | PRN

## 2021-10-15 ENCOUNTER — Other Ambulatory Visit: Payer: Self-pay

## 2021-10-15 DIAGNOSIS — G894 Chronic pain syndrome: Secondary | ICD-10-CM

## 2021-10-15 DIAGNOSIS — D5720 Sickle-cell/Hb-C disease without crisis: Secondary | ICD-10-CM

## 2021-10-19 ENCOUNTER — Telehealth: Payer: Self-pay

## 2021-10-19 NOTE — Telephone Encounter (Signed)
Oxycodone   [facility] on [address]

## 2021-10-20 ENCOUNTER — Telehealth: Payer: Self-pay

## 2021-10-20 NOTE — Telephone Encounter (Signed)
Oxycodone  °

## 2021-10-21 ENCOUNTER — Telehealth: Payer: Self-pay

## 2021-10-21 NOTE — Telephone Encounter (Signed)
She needs apt for an apt

## 2021-10-21 NOTE — Telephone Encounter (Signed)
Oxycodone   Pt wanted a call from you because she wanted to know WHY this med is only for a WEEK!

## 2021-10-21 NOTE — Telephone Encounter (Signed)
Thank you :)

## 2021-10-21 NOTE — Telephone Encounter (Signed)
Pt has been scheduled for tomorrow but she said she if she can't come tomorrow then she will call and reschedule for next week. I advised the patient that if she doesn't come to her appt she will not get refills on her medications until she is seen.

## 2021-10-22 ENCOUNTER — Ambulatory Visit: Payer: Medicaid Other | Admitting: Nurse Practitioner

## 2021-11-04 ENCOUNTER — Ambulatory Visit: Payer: Medicaid Other | Admitting: Nurse Practitioner

## 2021-11-06 ENCOUNTER — Telehealth: Payer: Self-pay | Admitting: Clinical

## 2021-11-06 NOTE — Telephone Encounter (Signed)
[facility] General Follow Up Note  11/06/2021 Name: Dorina Ribaudo MRN: [id number] DOB: May 26, 1986 Maricarmen Braziel is a 35 y.o. year old female who sees Barbette Merino, NP for primary care. LCSW was initially consulted to assist with housing and community resources.   Interpreter: No.   Interpreter Name & Language: none  Assessment: Patient experiencing housing barriers. She reported that she will need to move out of her current home soon.   Ongoing Intervention: Today patient called CSW and requested housing resources. She reported she will have to move out of her current home soon. She reported she is working and hoping to increase her hours at work soon. CSW emailed patient resources for affordable housing in [place]. Also referred patient to Child psychotherapist at [employer] and [facility]). Patient already enrolled in services at [facility] and connected with a caseworker. Coordinated with [facility] social worker regarding patient's needs.  Review of patient status, including review of consultants reports, relevant laboratory and other test results, and collaboration with appropriate care team members and the patient's provider was performed as part of comprehensive patient evaluation and provision of services.    Abigail Butts, LCSW [facility] [phone]

## 2021-11-08 ENCOUNTER — Emergency Department (HOSPITAL_COMMUNITY)
Admission: EM | Admit: 2021-11-08 | Discharge: 2021-11-08 | Disposition: A | Discharge status: Home / Self Care | Payer: Medicaid Other | Attending: Emergency Medicine | Admitting: Emergency Medicine

## 2021-11-08 ENCOUNTER — Other Ambulatory Visit: Payer: Self-pay

## 2021-11-08 ENCOUNTER — Encounter (HOSPITAL_COMMUNITY): Payer: Self-pay

## 2021-11-08 DIAGNOSIS — D5700 Hb-SS disease with crisis, unspecified: Secondary | ICD-10-CM | POA: Diagnosis not present

## 2021-11-08 DIAGNOSIS — Z87891 Personal history of nicotine dependence: Secondary | ICD-10-CM | POA: Diagnosis not present

## 2021-11-08 DIAGNOSIS — Z79899 Other long term (current) drug therapy: Secondary | ICD-10-CM | POA: Insufficient documentation

## 2021-11-08 LAB — CBC WITH DIFFERENTIAL/PLATELET
Abs Immature Granulocytes: 0.06 10*3/uL (ref 0.00–0.07)
Basophils Absolute: 0.1 10*3/uL (ref 0.0–0.1)
Basophils Relative: 1 %
Eosinophils Absolute: 0.6 10*3/uL — ABNORMAL HIGH (ref 0.0–0.5)
Eosinophils Relative: 6 %
HCT: 29.3 % — ABNORMAL LOW (ref 36.0–46.0)
Hemoglobin: 10.8 g/dL — ABNORMAL LOW (ref 12.0–15.0)
Immature Granulocytes: 1 %
Lymphocytes Relative: 36 %
Lymphs Abs: 3.5 10*3/uL (ref 0.7–4.0)
MCH: 27.6 pg (ref 26.0–34.0)
MCHC: 36.9 g/dL — ABNORMAL HIGH (ref 30.0–36.0)
MCV: 74.7 fL — ABNORMAL LOW (ref 80.0–100.0)
Monocytes Absolute: 0.8 10*3/uL (ref 0.1–1.0)
Monocytes Relative: 8 %
Neutro Abs: 4.8 10*3/uL (ref 1.7–7.7)
Neutrophils Relative %: 48 %
Platelets: 476 10*3/uL — ABNORMAL HIGH (ref 150–400)
RBC: 3.92 MIL/uL (ref 3.87–5.11)
RDW: 12.9 % (ref 11.5–15.5)
WBC: 9.9 10*3/uL (ref 4.0–10.5)
nRBC: 1.8 % — ABNORMAL HIGH (ref 0.0–0.2)

## 2021-11-08 LAB — COMPREHENSIVE METABOLIC PANEL
ALT: 10 U/L (ref 0–44)
AST: 20 U/L (ref 15–41)
Albumin: 3.7 g/dL (ref 3.5–5.0)
Alkaline Phosphatase: 46 U/L (ref 38–126)
Anion gap: 4 — ABNORMAL LOW (ref 5–15)
BUN: 10 mg/dL (ref 6–20)
CO2: 24 mmol/L (ref 22–32)
Calcium: 8.6 mg/dL — ABNORMAL LOW (ref 8.9–10.3)
Chloride: 109 mmol/L (ref 98–111)
Creatinine, Ser: 0.84 mg/dL (ref 0.44–1.00)
GFR, Estimated: >60 mL/min (ref >60)
Glucose, Bld: 92 mg/dL (ref 70–99)
Potassium: 4.3 mmol/L (ref 3.5–5.1)
Sodium: 137 mmol/L (ref 135–145)
Total Bilirubin: 0.6 mg/dL (ref 0.3–1.2)
Total Protein: 6.7 g/dL (ref 6.5–8.1)

## 2021-11-08 LAB — I-STAT BETA HCG BLOOD, ED (MC, WL, AP ONLY): I-stat hCG, quantitative: <5 m[IU]/mL (ref <5)

## 2021-11-08 LAB — LIPASE, BLOOD: Lipase: 64 U/L — ABNORMAL HIGH (ref 11–51)

## 2021-11-08 MED ORDER — ONDANSETRON HCL 4 MG/2ML IJ SOLN
4.0000 mg | INTRAMUSCULAR | Status: DC | PRN
  Administered 2021-11-08: 4 mg via INTRAVENOUS
  Filled 2021-11-08: qty 2

## 2021-11-08 MED ORDER — HYDROMORPHONE HCL 2 MG/ML IJ SOLN
2.0000 mg | Freq: Once | INTRAMUSCULAR | Status: AC
  Administered 2021-11-08: 2 mg via INTRAVENOUS
  Filled 2021-11-08: qty 1

## 2021-11-08 MED ORDER — OXYCODONE-ACETAMINOPHEN 5-325 MG PO TABS
1.0000 | ORAL_TABLET | Freq: Once | ORAL | Status: DC
  Filled 2021-11-08: qty 1

## 2021-11-08 MED ORDER — DIPHENHYDRAMINE HCL 25 MG PO CAPS
25.0000 mg | ORAL_CAPSULE | ORAL | Status: DC | PRN
  Administered 2021-11-08: 50 mg via ORAL
  Filled 2021-11-08: qty 2

## 2021-11-08 MED ORDER — HYDROMORPHONE HCL 1 MG/ML IJ SOLN
1.0000 mg | INTRAMUSCULAR | Status: AC
  Administered 2021-11-08: 1 mg via INTRAVENOUS
  Filled 2021-11-08: qty 1

## 2021-11-08 MED ORDER — KETOROLAC TROMETHAMINE 30 MG/ML IJ SOLN
15.0000 mg | Freq: Once | INTRAMUSCULAR | Status: AC
  Administered 2021-11-08: 15 mg via INTRAVENOUS
  Filled 2021-11-08: qty 1

## 2021-11-08 MED ORDER — SODIUM CHLORIDE 0.45 % IV SOLN: INTRAVENOUS | Status: DC

## 2021-11-08 MED ORDER — OXYCODONE HCL 5 MG PO TABS
5.0000 mg | ORAL_TABLET | Freq: Once | ORAL | Status: AC
  Administered 2021-11-08: 5 mg via ORAL
  Filled 2021-11-08: qty 1

## 2021-11-08 MED ORDER — HYDROMORPHONE HCL 1 MG/ML IJ SOLN
0.5000 mg | INTRAMUSCULAR | Status: AC
  Administered 2021-11-08: 0.5 mg via INTRAVENOUS
  Filled 2021-11-08: qty 1

## 2021-11-08 NOTE — ED Provider Notes (Signed)
Emergency Medicine Provider Triage Evaluation Note  Hannah Hawkins , a 35 y.o. female  was evaluated in triage.  Pt complains of Lower abdominal pain right sided.  This started today.  She had been sexually active earlier in the week and had an odd feeling in the same area but it went away and is now worse.  She states the pain in her lower legs bilaterally is consistent with her sickle cell pain.  No fevers, no urinary symptoms .  She denies abnormal discharge.   Review of Systems  Positive: Pelvic pain, abdominal pain, leg pain Negative: Chest pain, cough, shortness of breath  Physical Exam  BP 123/74 (BP Location: Right Arm)   Pulse 87   Temp 97.7 F (36.5 C) (Oral)   Resp 18   SpO2 100%  Gen:   Awake, no distress   Resp:  Normal effort  MSK:   Moves extremities without difficulty  Other:  Normal speech  Medical Decision Making  Medically screening exam initiated at 3:33 PM.  Appropriate orders placed.  Christabel Camire was informed that the remainder of the evaluation will be completed by another provider, this initial triage assessment does not replace that evaluation, and the importance of remaining in the ED until their evaluation is complete.  Note: Portions of this report may have been transcribed using voice recognition software. Every effort was made to ensure accuracy; however, inadvertent computerized transcription errors may be present    Norman Clay 11/08/21 1536    Gerhard Munch, MD 11/08/21 1806

## 2021-11-08 NOTE — ED Provider Notes (Signed)
[facility] DEPT Provider Note   CSN: [id number] Arrival date & time: 11/08/21  1447     History Chief Complaint  Patient presents with   Sickle Cell Pain Crisis   Abdominal Cramping    Hannah Hawkins is a 35 y.o. female.  HPI Patient presents with leg pain, right greater than left.  She previously had abdominal pain, but my exam has none, no complaints in this regard.  She states that she has leg pain, typically as a manifestation of her sickle cell disease.  This episode began today.  No clear precipitant.  She knows that as she typically experienced in the past she developed pain right greater than left around the knee and calf.  No chest pain, no dyspnea, no current abdominal pain, no nausea, vomiting.  She is out of her home pain medication for sickle cell disease.  Pain is sore, severe.    Past Medical History:  Diagnosis Date   Sickle cell anemia ([place])     Patient Active Problem List   Diagnosis Date Noted   Cholelithiasis 06/02/2021   Epigastric pain    Lactic acidosis    RUQ abdominal pain    Rhabdomyolysis 08/07/2020   Sickle cell crisis ([place]) 08/06/2020   Elevated LFTs 08/06/2020   Cyst of spleen 08/06/2020   Leukocytosis 08/06/2020   SIRS (systemic inflammatory response syndrome) (HCC) 08/06/2020   Non-intractable vomiting    Joint pain in fingers of both hands 06/11/2020   Hypokalemia 07/17/2019   GERD (gastroesophageal reflux disease) 05/29/2019   History of cocaine use 05/18/2019   Chronic pain syndrome    Cocaine use    Sickle cell pain crisis ([place]) 11/14/2017   Anemia of chronic disease 11/13/2017   Sinusitis, chronic 11/13/2017   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    Past Surgical History:  Procedure Laterality Date   CHOLECYSTECTOMY N/A 06/03/2021   Procedure: LAPAROSCOPIC CHOLECYSTECTOMY;  Surgeon: Coralie Keens, MD;  Location: [facility];  Service: General;  Laterality: N/A;   EYE SURGERY  03/09/2017    left eye   EYE SURGERY Right 09/2019   right hemerage      OB History     Gravida  2   Para      Term      Preterm      AB      Living  1      SAB      IAB      Ectopic      Multiple      Live Births              Family History  Problem Relation Age of Onset   Diabetes Father     Social History   Tobacco Use   Smoking status: Former    Types: Cigarettes   Smokeless tobacco: Never  Vaping Use   Vaping Use: Never used  Substance Use Topics   Alcohol use: Not Currently   Drug use: Not Currently    Types: Marijuana    Home Medications Prior to Admission medications   Medication Sig Start Date End Date Taking? Authorizing Provider  fluticasone (FLONASE) 50 MCG/ACT nasal spray Place 2 sprays into both nostrils daily. 08/14/21   Vevelyn Francois, NP  lidocaine (LIDODERM) 5 % Place 1 patch onto the skin daily. Remove & Discard patch within 12 hours or as directed by MD 06/19/21   Vevelyn Francois, NP  mirtazapine (REMERON) 15 MG tablet Take 1 tablet (  15 mg total) by mouth at bedtime. 09/07/21 09/07/22  Vevelyn Francois, NP  montelukast (SINGULAIR) 10 MG tablet Take 1 tablet (10 mg total) by mouth at bedtime. 08/14/21   Vevelyn Francois, NP  NARCAN 4 MG/0.1ML LIQD nasal spray kit Place 4 mg into the nose once as needed (overdose). Patient not taking: Reported on 09/07/2021 12/22/17   [provider]  norethindrone (MICRONOR) 0.35 MG tablet Take 1 tablet (0.35 mg total) by mouth daily. 09/08/21   Vevelyn Francois, NP  pantoprazole (PROTONIX) 40 MG tablet Take 1 tablet (40 mg total) by mouth daily. 09/07/21 09/07/22  Vevelyn Francois, NP  tiZANidine (ZANAFLEX) 2 MG tablet Take 1 tablet (2 mg total) by mouth every 6 (six) hours as needed for muscle spasms. Patient not taking: Reported on 09/07/2021 05/28/21   Vevelyn Francois, NP    Allergies    Penicillins  Review of Systems   Review of Systems  Constitutional:        Per HPI, otherwise negative  HENT:          Per HPI, otherwise negative  Respiratory:         Per HPI, otherwise negative  Cardiovascular:        Per HPI, otherwise negative  Gastrointestinal:  Negative for vomiting.  Endocrine:       Negative aside from HPI  Genitourinary:        Neg aside from HPI   Musculoskeletal:        Per HPI, otherwise negative  Skin: Negative.   Allergic/Immunologic: Positive for immunocompromised state.  Neurological:  Negative for syncope.   Physical Exam Updated Vital Signs BP (!) 140/109   Pulse 84   Temp 97.7 F (36.5 C) (Oral)   Resp 18   SpO2 100%   Physical Exam Vitals and nursing note reviewed.  Constitutional:      General: She is not in acute distress.    Appearance: She is well-developed.  HENT:     Head: Normocephalic and atraumatic.  Eyes:     Conjunctiva/sclera: Conjunctivae normal.  Cardiovascular:     Rate and Rhythm: Normal rate and regular rhythm.  Pulmonary:     Effort: Pulmonary effort is normal. No respiratory distress.     Breath sounds: Normal breath sounds. No stridor.  Abdominal:     General: There is no distension.  Skin:    General: Skin is warm and dry.  Neurological:     Mental Status: She is alert and oriented to person, place, and time.     Cranial Nerves: No cranial nerve deficit.    ED Results / Procedures / Treatments   Labs (all labs ordered are listed, but only abnormal results are displayed) Labs Reviewed  CBC WITH DIFFERENTIAL/PLATELET - Abnormal; Notable for the following components:      Result Value   Hemoglobin 10.8 (*)    HCT 29.3 (*)    MCV 74.7 (*)    MCHC 36.9 (*)    Platelets 476 (*)    nRBC 1.8 (*)    Eosinophils Absolute 0.6 (*)    All other components within normal limits  COMPREHENSIVE METABOLIC PANEL - Abnormal; Notable for the following components:   Calcium 8.6 (*)    Anion gap 4 (*)    All other components within normal limits  LIPASE, BLOOD - Abnormal; Notable for the following components:   Lipase 64 (*)     All other components within normal limits  URINALYSIS, ROUTINE W REFLEX MICROSCOPIC  RETICULOCYTES  I-STAT BETA HCG BLOOD, ED ([facility], [facility], [facility] ONLY)    EKG None  Radiology No results found.  Procedures Procedures   Medications Ordered in ED Medications  HYDROmorphone (DILAUDID) injection 1 mg (has no administration in time range)  diphenhydrAMINE (BENADRYL) capsule 25-50 mg (50 mg Oral Given 11/08/21 1749)  ondansetron (ZOFRAN) injection 4 mg (4 mg Intravenous Given 11/08/21 1748)  0.45 % sodium chloride infusion ( Intravenous New Bag/Given 11/08/21 1756)  HYDROmorphone (DILAUDID) injection 0.5 mg (0.5 mg Intravenous Given 11/08/21 1748)    ED Course  I have reviewed the triage vital signs and the nursing notes.  Pertinent labs & imaging results that were available during my care of the patient were reviewed by me and considered in my medical decision making (see chart for details).   8:34 PM In no distress, awake, alert, speaking clearly.  Vital signs remain unremarkable, labs reviewed, discussed, no substantial abnormalities. She continues to deny other complaints, does have some ongoing pain in her lower legs, no evidence for decompensated sickle cell disease, and with no other ongoing concerns, no fever, no evidence for distress, patient appropriate for discharge with outpatient follow-up.  Final Clinical Impression(s) / ED Diagnoses Final diagnoses:  Sickle cell pain crisis [facility])    Rx / DC Orders ED Discharge Orders     None        Carmin Muskrat, MD 11/08/21 2034

## 2021-11-08 NOTE — Discharge Instructions (Signed)
As discussed, your evaluation today has been largely reassuring.  But, it is important that you monitor your condition carefully, and do not hesitate to return to the ED if you develop new, or concerning changes in your condition. ? ?Otherwise, please follow-up with your physician for appropriate ongoing care. ? ?

## 2021-11-08 NOTE — ED Triage Notes (Signed)
Pt c/o bilateral leg pain and lower abdominal cramping that started today.  Thinks she may be in a sickle cell crisis.

## 2021-11-09 ENCOUNTER — Ambulatory Visit (INDEPENDENT_AMBULATORY_CARE_PROVIDER_SITE_OTHER): Payer: Medicaid Other | Admitting: Nurse Practitioner

## 2021-11-09 DIAGNOSIS — G894 Chronic pain syndrome: Secondary | ICD-10-CM

## 2021-11-09 DIAGNOSIS — F1490 Cocaine use, unspecified, uncomplicated: Secondary | ICD-10-CM

## 2021-11-09 NOTE — Progress Notes (Deleted)
   [facility] [address] [place], [place]  [address] Phone:  [phone]   Fax:  [phone] 

## 2021-11-10 ENCOUNTER — Telehealth (HOSPITAL_COMMUNITY): Payer: Self-pay | Admitting: *Deleted

## 2021-11-10 ENCOUNTER — Encounter (HOSPITAL_COMMUNITY): Payer: Self-pay | Admitting: Emergency Medicine

## 2021-11-10 ENCOUNTER — Emergency Department (HOSPITAL_COMMUNITY): Payer: Medicaid Other

## 2021-11-10 ENCOUNTER — Emergency Department (HOSPITAL_COMMUNITY)
Admission: EM | Admit: 2021-11-10 | Discharge: 2021-11-10 | Disposition: A | Discharge status: Home / Self Care | Payer: Medicaid Other | Attending: Emergency Medicine | Admitting: Emergency Medicine

## 2021-11-10 DIAGNOSIS — S90111A Contusion of right great toe without damage to nail, initial encounter: Secondary | ICD-10-CM | POA: Diagnosis not present

## 2021-11-10 DIAGNOSIS — Z7951 Long term (current) use of inhaled steroids: Secondary | ICD-10-CM | POA: Diagnosis not present

## 2021-11-10 DIAGNOSIS — S99921A Unspecified injury of right foot, initial encounter: Secondary | ICD-10-CM | POA: Diagnosis present

## 2021-11-10 DIAGNOSIS — N9489 Other specified conditions associated with female genital organs and menstrual cycle: Secondary | ICD-10-CM | POA: Insufficient documentation

## 2021-11-10 DIAGNOSIS — W208XXA Other cause of strike by thrown, projected or falling object, initial encounter: Secondary | ICD-10-CM | POA: Insufficient documentation

## 2021-11-10 DIAGNOSIS — D5700 Hb-SS disease with crisis, unspecified: Secondary | ICD-10-CM | POA: Diagnosis not present

## 2021-11-10 DIAGNOSIS — Z87891 Personal history of nicotine dependence: Secondary | ICD-10-CM | POA: Diagnosis not present

## 2021-11-10 LAB — RETICULOCYTES
Immature Retic Fract: 38.4 % — ABNORMAL HIGH (ref 2.3–15.9)
RBC.: 3.96 MIL/uL (ref 3.87–5.11)
Retic Count, Absolute: 115.2 10*3/uL (ref 19.0–186.0)
Retic Ct Pct: 2.9 % (ref 0.4–3.1)

## 2021-11-10 LAB — COMPREHENSIVE METABOLIC PANEL
ALT: 12 U/L (ref 0–44)
AST: 20 U/L (ref 15–41)
Albumin: 4.1 g/dL (ref 3.5–5.0)
Alkaline Phosphatase: 52 U/L (ref 38–126)
Anion gap: 5 (ref 5–15)
BUN: 11 mg/dL (ref 6–20)
CO2: 28 mmol/L (ref 22–32)
Calcium: 9.1 mg/dL (ref 8.9–10.3)
Chloride: 107 mmol/L (ref 98–111)
Creatinine, Ser: 0.81 mg/dL (ref 0.44–1.00)
GFR, Estimated: >60 mL/min (ref >60)
Glucose, Bld: 86 mg/dL (ref 70–99)
Potassium: 4.1 mmol/L (ref 3.5–5.1)
Sodium: 140 mmol/L (ref 135–145)
Total Bilirubin: 0.7 mg/dL (ref 0.3–1.2)
Total Protein: 7.2 g/dL (ref 6.5–8.1)

## 2021-11-10 LAB — CBC WITH DIFFERENTIAL/PLATELET
Abs Immature Granulocytes: 0.06 10*3/uL (ref 0.00–0.07)
Basophils Absolute: 0.1 10*3/uL (ref 0.0–0.1)
Basophils Relative: 1 %
Eosinophils Absolute: 1.5 10*3/uL — ABNORMAL HIGH (ref 0.0–0.5)
Eosinophils Relative: 15 %
HCT: 29.8 % — ABNORMAL LOW (ref 36.0–46.0)
Hemoglobin: 11 g/dL — ABNORMAL LOW (ref 12.0–15.0)
Immature Granulocytes: 1 %
Lymphocytes Relative: 29 %
Lymphs Abs: 2.9 10*3/uL (ref 0.7–4.0)
MCH: 27.5 pg (ref 26.0–34.0)
MCHC: 36.9 g/dL — ABNORMAL HIGH (ref 30.0–36.0)
MCV: 74.5 fL — ABNORMAL LOW (ref 80.0–100.0)
Monocytes Absolute: 0.9 10*3/uL (ref 0.1–1.0)
Monocytes Relative: 10 %
Neutro Abs: 4.5 10*3/uL (ref 1.7–7.7)
Neutrophils Relative %: 44 %
Platelets: 491 10*3/uL — ABNORMAL HIGH (ref 150–400)
RBC: 4 MIL/uL (ref 3.87–5.11)
RDW: 12.8 % (ref 11.5–15.5)
WBC: 9.9 10*3/uL (ref 4.0–10.5)
nRBC: 2.1 % — ABNORMAL HIGH (ref 0.0–0.2)

## 2021-11-10 LAB — I-STAT BETA HCG BLOOD, ED (MC, WL, AP ONLY): I-stat hCG, quantitative: <5 m[IU]/mL (ref <5)

## 2021-11-10 MED ORDER — HYDROMORPHONE HCL 1 MG/ML IJ SOLN
1.0000 mg | Freq: Once | INTRAMUSCULAR | Status: AC
  Administered 2021-11-10: 1 mg via INTRAVENOUS
  Filled 2021-11-10: qty 1

## 2021-11-10 MED ORDER — OXYCODONE HCL 5 MG PO TABS
5.0000 mg | ORAL_TABLET | ORAL | 0 refills | Status: DC | PRN
Start: 2021-11-10 — End: 2021-11-11

## 2021-11-10 MED ORDER — OXYCODONE HCL 5 MG PO TABS
15.0000 mg | ORAL_TABLET | Freq: Once | ORAL | Status: AC
  Administered 2021-11-10: 15 mg via ORAL
  Filled 2021-11-10: qty 3

## 2021-11-10 MED ORDER — OXYCODONE-ACETAMINOPHEN 5-325 MG PO TABS
1.0000 | ORAL_TABLET | Freq: Once | ORAL | Status: DC
  Filled 2021-11-10: qty 1

## 2021-11-10 MED ORDER — HYDROMORPHONE HCL 2 MG/ML IJ SOLN
2.0000 mg | Freq: Once | INTRAMUSCULAR | Status: AC
  Administered 2021-11-10: 2 mg via INTRAVENOUS
  Filled 2021-11-10: qty 1

## 2021-11-10 MED ORDER — OXYCODONE-ACETAMINOPHEN 5-325 MG PO TABS: 1.0000 | ORAL_TABLET | Freq: Four times a day (QID) | ORAL | 0 refills | Status: DC | PRN

## 2021-11-10 MED ORDER — SODIUM CHLORIDE 0.9 % IV BOLUS
1000.0000 mL | Freq: Once | INTRAVENOUS | Status: AC
  Administered 2021-11-10: 1000 mL via INTRAVENOUS

## 2021-11-10 MED ORDER — IBUPROFEN 800 MG PO TABS
800.0000 mg | ORAL_TABLET | Freq: Once | ORAL | Status: AC
  Administered 2021-11-10: 800 mg via ORAL
  Filled 2021-11-10: qty 1

## 2021-11-10 MED ORDER — ONDANSETRON HCL 4 MG/2ML IJ SOLN
4.0000 mg | Freq: Once | INTRAMUSCULAR | Status: AC
  Administered 2021-11-10: 4 mg via INTRAVENOUS
  Filled 2021-11-10: qty 2

## 2021-11-10 MED ORDER — KETOROLAC TROMETHAMINE 15 MG/ML IJ SOLN
15.0000 mg | Freq: Once | INTRAMUSCULAR | Status: AC
  Administered 2021-11-10: 15 mg via INTRAVENOUS
  Filled 2021-11-10: qty 1

## 2021-11-10 MED ORDER — DIPHENHYDRAMINE HCL 50 MG/ML IJ SOLN
50.0000 mg | Freq: Once | INTRAMUSCULAR | Status: AC
  Administered 2021-11-10: 50 mg via INTRAVENOUS
  Filled 2021-11-10: qty 1

## 2021-11-10 NOTE — Telephone Encounter (Signed)
Patient called requesting to come to the day hospital for sickle cell pain. Patient reports that she dropped a can on her right foot this morning and triggered a pain crisis in her right leg. Reports that she called EMS and when they arrived they placed a band-aid on her foot. Rates her pain at 9/10. Patient was treated in the ED last Sunday for pain crisis. Reports that she is out of per prescription pain medications and her provider will not refill them until her UDS results.  [place], FNP notified and advised that patient go to the ED for X-ray and evaluation of right foot. Patient advised and expresses an understanding.

## 2021-11-10 NOTE — ED Triage Notes (Signed)
Cropped a can of collard greens on her R great toe this morning, reports it is triggering her sickle cell pain. Ambulatory w/ pain. Reports lower abdominal cramping as well.

## 2021-11-10 NOTE — Discharge Instructions (Addendum)
You were seen today for evaluation of your sickle cell pain. I have given you a very small amount of narcotics to hold you over until you can get refills of your home meds. Please follow-up with your PCP ASAP for continued management of this. Otherwise, your workup today was very reassuring.  Return if development of new or worsening symptoms.

## 2021-11-10 NOTE — ED Provider Notes (Signed)
[facility] DEPT Provider Note   CSN: [id number] Arrival date & time: 11/10/21  1206     History Chief Complaint  Patient presents with   Toe Injury   Sickle Cell Pain Crisis   Abdominal Pain    Hannah Hawkins is a 35 y.o. female.  Patient with history of sickle cell presents today with sickle cell pain crisis.  She states that her symptoms began this morning after she accidentally dropped a can of collard greens onto her right big toe. She states that she has right leg pain, which is a typical manifestation of her sickle cell disease.  The pain is localized to her right lower leg and calf.  No chest pain, no dyspnea, no nausea, vomiting.  Of note, she endorses that she has left lower quadrant abdominal pain which began at the same time as the rest of her sickle cell crisis symptoms.  She states that she had similar pain during her crisis for which she was seen in the emergency department on Sunday.  Her pain was relieved by medications in the emergency department, and therefore it was felt that no further work-up was warranted.  She states that her pain continued to not be present until this morning when she developed the rest of her sickle cell crises pain.  She states that her pain is cramping in nature and feels like that related to her menstrual cycle.  She denies fevers, chills, constipation, vaginal discharge, dysuria, hematuria, hematochezia, melena, painful sexual intercourse.  She denies any recent new sexual partners.  Her last menstrual cycle ended last Wednesday and was normal.  Her last bowel movement was last night and was soft and normal for her. She is out of her home pain medication for sickle cell disease, had a urine drug screen yesterday and is taking steps to receive more home sickle cell medications at this time.  The history is provided by the patient. No language interpreter was used.  Sickle Cell Pain Crisis Associated symptoms: no  chest pain, no cough, no fever, no headaches, no nausea, no shortness of breath, no vomiting and no wheezing   Abdominal Pain Associated symptoms: no chest pain, no chills, no constipation, no cough, no diarrhea, no dysuria, no fever, no hematuria, no nausea, no shortness of breath, no vaginal bleeding, no vaginal discharge and no vomiting       Past Medical History:  Diagnosis Date   Sickle cell anemia (HCC)     Patient Active Problem List   Diagnosis Date Noted   Cholelithiasis 06/02/2021   Epigastric pain    Lactic acidosis    RUQ abdominal pain    Rhabdomyolysis 08/07/2020   Sickle cell crisis ([place]) 08/06/2020   Elevated LFTs 08/06/2020   Cyst of spleen 08/06/2020   Leukocytosis 08/06/2020   SIRS (systemic inflammatory response syndrome) (HCC) 08/06/2020   Non-intractable vomiting    Joint pain in fingers of both hands 06/11/2020   Hypokalemia 07/17/2019   GERD (gastroesophageal reflux disease) 05/29/2019   History of cocaine use 05/18/2019   Chronic pain syndrome    Cocaine use    Sickle cell pain crisis ([place]) 11/14/2017   Anemia of chronic disease 11/13/2017   Sinusitis, chronic 11/13/2017   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    Past Surgical History:  Procedure Laterality Date   CHOLECYSTECTOMY N/A 06/03/2021   Procedure: LAPAROSCOPIC CHOLECYSTECTOMY;  Surgeon: Coralie Keens, MD;  Location: [facility];  Service: General;  Laterality: N/A;   EYE SURGERY  03/09/2017   left eye   EYE SURGERY Right 09/2019   right hemerage      OB History     Gravida  2   Para      Term      Preterm      AB      Living  1      SAB      IAB      Ectopic      Multiple      Live Births              Family History  Problem Relation Age of Onset   Diabetes Father     Social History   Tobacco Use   Smoking status: Former    Types: Cigarettes   Smokeless tobacco: Never  Vaping Use   Vaping Use: Never used  Substance Use Topics   Alcohol use:  Not Currently   Drug use: Not Currently    Types: Marijuana    Home Medications Prior to Admission medications   Medication Sig Start Date End Date Taking? Authorizing Provider  fluticasone (FLONASE) 50 MCG/ACT nasal spray Place 2 sprays into both nostrils daily. 08/14/21   Vevelyn Francois, NP  lidocaine (LIDODERM) 5 % Place 1 patch onto the skin daily. Remove & Discard patch within 12 hours or as directed by MD 06/19/21   Vevelyn Francois, NP  mirtazapine (REMERON) 15 MG tablet Take 1 tablet (15 mg total) by mouth at bedtime. 09/07/21 09/07/22  Vevelyn Francois, NP  montelukast (SINGULAIR) 10 MG tablet Take 1 tablet (10 mg total) by mouth at bedtime. 08/14/21   Vevelyn Francois, NP  NARCAN 4 MG/0.1ML LIQD nasal spray kit Place 4 mg into the nose once as needed (overdose). Patient not taking: Reported on 09/07/2021 12/22/17   [provider]  norethindrone (MICRONOR) 0.35 MG tablet Take 1 tablet (0.35 mg total) by mouth daily. 09/08/21   Vevelyn Francois, NP  pantoprazole (PROTONIX) 40 MG tablet Take 1 tablet (40 mg total) by mouth daily. 09/07/21 09/07/22  Vevelyn Francois, NP  tiZANidine (ZANAFLEX) 2 MG tablet Take 1 tablet (2 mg total) by mouth every 6 (six) hours as needed for muscle spasms. Patient not taking: Reported on 09/07/2021 05/28/21   Vevelyn Francois, NP    Allergies    Penicillins  Review of Systems   Review of Systems  Constitutional:  Negative for chills and fever.  Respiratory:  Negative for cough, shortness of breath, wheezing and stridor.   Cardiovascular:  Negative for chest pain, palpitations and leg swelling.  Gastrointestinal:  Positive for abdominal pain. Negative for abdominal distention, anal bleeding, blood in stool, constipation, diarrhea, nausea, rectal pain and vomiting.  Genitourinary:  Positive for pelvic pain. Negative for dyspareunia, dysuria, flank pain, hematuria, menstrual problem, vaginal bleeding, vaginal discharge and vaginal pain.  Musculoskeletal:   Negative for neck pain and neck stiffness.  Skin:  Negative for rash and wound.  Allergic/Immunologic: Positive for immunocompromised state.  Neurological:  Negative for dizziness, tremors, seizures, syncope, facial asymmetry, speech difficulty, weakness, light-headedness, numbness and headaches.  Psychiatric/Behavioral:  Negative for confusion and decreased concentration.   All other systems reviewed and are negative.  Physical Exam Updated Vital Signs BP 122/81   Pulse 88   Temp 98.6 F (37 C) (Oral)   Resp 18   SpO2 100%   Physical Exam Vitals and nursing note reviewed.  Constitutional:      General: She  is not in acute distress.    Appearance: She is well-developed and normal weight. She is not ill-appearing, toxic-appearing or diaphoretic.  HENT:     Head: Normocephalic and atraumatic.  Eyes:     Extraocular Movements: Extraocular movements intact.     Pupils: Pupils are equal, round, and reactive to light.  Cardiovascular:     Rate and Rhythm: Normal rate and regular rhythm.     Heart sounds: Normal heart sounds.  Pulmonary:     Effort: Pulmonary effort is normal. No respiratory distress.     Breath sounds: Normal breath sounds. No stridor. No wheezing, rhonchi or rales.  Abdominal:     General: Abdomen is flat. There is no distension. There are no signs of injury.     Palpations: Abdomen is soft.     Tenderness: There is abdominal tenderness in the left lower quadrant. There is no right CVA tenderness, left CVA tenderness, guarding or rebound. Negative signs include Murphy's sign, Rovsing's sign, McBurney's sign, psoas sign and obturator sign.  Skin:    General: Skin is warm and dry.     Comments: Small superficial abrasion with underlying hematoma noted to anterior great toe.  Normal bleeding present.  ROM intact  Neurological:     General: No focal deficit present.     Mental Status: She is alert.  Psychiatric:        Mood and Affect: Mood normal.        Behavior:  Behavior normal.    ED Results / Procedures / Treatments   Labs (all labs ordered are listed, but only abnormal results are displayed) Labs Reviewed  CBC WITH DIFFERENTIAL/PLATELET - Abnormal; Notable for the following components:      Result Value   Hemoglobin 11.0 (*)    HCT 29.8 (*)    MCV 74.5 (*)    MCHC 36.9 (*)    Platelets 491 (*)    nRBC 2.1 (*)    Eosinophils Absolute 1.5 (*)    All other components within normal limits  RETICULOCYTES - Abnormal; Notable for the following components:   Immature Retic Fract 38.4 (*)    All other components within normal limits  COMPREHENSIVE METABOLIC PANEL  I-STAT BETA HCG BLOOD, ED ([facility], [facility], [facility] ONLY)    EKG None  Radiology DG Foot Complete Right  Result Date: 11/10/2021 CLINICAL DATA:  Right foot pain EXAM: RIGHT FOOT COMPLETE - 3+ VIEW COMPARISON:  None. FINDINGS: There is no evidence of fracture or dislocation. There is no evidence of arthropathy or other focal bone abnormality. Soft tissues are unremarkable. IMPRESSION: No acute osseous abnormality identified. Electronically Signed   By: Ofilia Neas M.D.   On: 11/10/2021 13:22    Procedures Procedures   Medications Ordered in ED Medications  HYDROmorphone (DILAUDID) injection 2 mg (has no administration in time range)  ibuprofen (ADVIL) tablet 800 mg (800 mg Oral Given 11/10/21 1250)  HYDROmorphone (DILAUDID) injection 1 mg (1 mg Intravenous Given 11/10/21 1415)  ketorolac (TORADOL) 15 MG/ML injection 15 mg (15 mg Intravenous Given 11/10/21 1414)  diphenhydrAMINE (BENADRYL) injection 50 mg (50 mg Intravenous Given 11/10/21 1414)  sodium chloride 0.9 % bolus 1,000 mL (1,000 mLs Intravenous New Bag/Given 11/10/21 1413)  ondansetron (ZOFRAN) injection 4 mg (4 mg Intravenous Given 11/10/21 1423)    ED Course  I have reviewed the triage vital signs and the nursing notes.  Pertinent labs & imaging results that were available during my care of the patient were reviewed by  me and considered in my medical decision making (see chart for details).    MDM Rules/Calculators/A&P                         Patient presents today for sickle cell crisis after she dropped a can of collard greens on her right great toe.  Imaging of same unremarkable for fractures or other injury.  Small superficial abrasion noted to distal joint right great toe proximal to the nailbed.  Underlying hematoma present.  Abrasion does not travel into the nailbed.  Patient with gel nail polish in place.  Discussed removing this to evaluate integrity of her toenail and potentially blood underneath which she denied.  Patient understanding of potential dangers of this.  Additionally, patient concerned with left lower quadrant pelvic pain.  She states her menstrual cycle ended last Wednesday and was normal, she denies vaginal discharge, dysuria, or constipation.  Of note, she did have similar symptoms on 'Sunday with her last sickle cell crisis when she was seen in the ER.  She states that her symptoms were relieved with pain meds given in the ER and did not return until today with the rest of her sickle cell crises symptoms.  After pain medication and fluids, patient states that her pain has subsided including her abdominal pain.  Discussed further management with her including CT scan, abdominal labs, STD testing which she declined.  Feel that her pain is likely due to her sickle cell crises as it seems to be present only during crises.  I have advised for her to follow-up with sickle cell clinic for further management of this.  Additionally, patient states that she has out of her home meds which has been causing her to have difficulty managing her symptoms.  She states that she has an appointment tomorrow with the sickle cell clinic to get refills on her medication.  Therefore, I have agreed to supply her with a extremely small amount of oxycodone to go home with to manage in the interim.  After 2 rounds of  pain medication, patient states that her symptoms have significantly subsided and she is ready for discharge.  She has been educated on red flag symptoms that would prompt immediate return.  Patient discharged in stable condition.   Final Clinical Impression(s) / ED Diagnoses Final diagnoses:  Sickle cell crisis (HCC)    Rx / DC Orders ED Discharge Orders          Ordered    oxyCODONE-acetaminophen (PERCOCET/ROXICET) 5-325 MG tablet  Every 6 hours PRN,   Status:  Discontinued        11/10/21 1626    oxyCODONE (ROXICODONE) 5 MG immediate release tablet  Every 4 hours PRN        11' /22/22 1642          An After Visit Summary was printed and given to the patient.    Nestor Lewandowsky 11/10/21 1700    Isla Pence, MD 11/11/21 1511

## 2021-11-10 NOTE — ED Provider Notes (Signed)
Emergency Medicine Provider Triage Evaluation Note  Hannah Hawkins , a 35 y.o. female  was evaluated in triage.  Pt complains of right great toe pain after she dropped a can of collard greens on it.  She also thinks that this triggered a sickle cell crisis is having sickle cell type pain in the right leg and foot.  No chest pain or shortness of breath.  Was here Sunday for similar symptoms.  Review of Systems  Positive:  Negative: See above   Physical Exam  BP 123/89   Pulse 65   Temp 98.6 F (37 C) (Oral)   Resp 16   SpO2 100%  Gen:   Awake, no distress   Resp:  Normal effort  MSK:   Moves extremities without difficulty  Other:  2+ dorsalis pedis pulse felt on the right foot.  Right great toe is tender to palpation.  Medical Decision Making  Medically screening exam initiated at 12:27 PM.  Appropriate orders placed.  Hannah Hawkins was informed that the remainder of the evaluation will be completed by another provider, this initial triage assessment does not replace that evaluation, and the importance of remaining in the ED until their evaluation is complete.     Honor Loh [place], PA-C 11/10/21 1228    Mancel Bale, MD 11/11/21 [phone]

## 2021-11-11 ENCOUNTER — Ambulatory Visit: Payer: Medicaid Other | Admitting: Nurse Practitioner

## 2021-11-11 ENCOUNTER — Other Ambulatory Visit: Payer: Self-pay | Admitting: Nurse Practitioner

## 2021-11-11 DIAGNOSIS — D5720 Sickle-cell/Hb-C disease without crisis: Secondary | ICD-10-CM

## 2021-11-11 DIAGNOSIS — G894 Chronic pain syndrome: Secondary | ICD-10-CM

## 2021-11-11 LAB — DRUG SCREEN 764883 11+OXYCO+ALC+CRT-BUND
Amphetamines, Urine: NEGATIVE ng/mL
BENZODIAZ UR QL: NEGATIVE ng/mL
Barbiturate: NEGATIVE ng/mL
Cannabinoid Quant, Ur: NEGATIVE ng/mL
Cocaine (Metabolite): NEGATIVE ng/mL
Creatinine: 64.9 mg/dL (ref 20.0–300.0)
Ethanol: NEGATIVE %
Meperidine: NEGATIVE ng/mL
Methadone Screen, Urine: NEGATIVE ng/mL
OPIATE SCREEN URINE: NEGATIVE ng/mL
Oxycodone/Oxymorphone, Urine: NEGATIVE ng/mL
Phencyclidine: NEGATIVE ng/mL
Propoxyphene: NEGATIVE ng/mL
Tramadol: NEGATIVE ng/mL
pH, Urine: 6.5 (ref 4.5–8.9)

## 2021-11-11 MED ORDER — OXYCODONE HCL 15 MG PO TABS: 15.0000 mg | ORAL_TABLET | Freq: Four times a day (QID) | ORAL | 0 refills | Status: DC | PRN

## 2021-11-20 NOTE — Progress Notes (Signed)
   [facility] Patient [facility] [name] [place], [place]  [address] Phone:  [phone]   Fax:  [phone]  UDS completed

## 2021-11-24 ENCOUNTER — Encounter: Payer: Self-pay | Admitting: Nurse Practitioner

## 2021-11-24 ENCOUNTER — Telehealth (INDEPENDENT_AMBULATORY_CARE_PROVIDER_SITE_OTHER): Payer: Medicaid Other | Admitting: Nurse Practitioner

## 2021-11-24 ENCOUNTER — Other Ambulatory Visit: Payer: Self-pay

## 2021-11-24 DIAGNOSIS — J069 Acute upper respiratory infection, unspecified: Secondary | ICD-10-CM

## 2021-11-24 DIAGNOSIS — R070 Pain in throat: Secondary | ICD-10-CM

## 2021-11-24 MED ORDER — LIDOCAINE VISCOUS HCL 2 % MT SOLN
15.0000 mL | OROMUCOSAL | 0 refills | Status: DC | PRN
Start: 2021-11-24 — End: 2022-03-17

## 2021-11-24 MED ORDER — BENZONATATE 200 MG PO CAPS
200.0000 mg | ORAL_CAPSULE | Freq: Two times a day (BID) | ORAL | 0 refills | Status: DC | PRN
Start: 2021-11-24 — End: 2022-03-17

## 2021-11-24 NOTE — Progress Notes (Signed)
Virtual Visit via Video Note  I connected with Hannah Hawkins on 11/24/21 at  2:10 PM EST by video and verified that I am speaking with the correct person using two identifiers.   I discussed the limitations, risks, security and privacy concerns of performing an evaluation and management service by video and the availability of in person appointments. I also discussed with the patient that there may be a patient responsible charge related to this service. The patient expressed understanding and agreed to proceed.  Patient home Provider Office  History of Present Illness:    Hannah Hawkins 35 y/o female  has a past medical history of Sickle cell anemia (HCC). Patient being seen via video visit for flu like symptoms since yesterday.  Verbalizes having non productive cough, sore throat, nasal congestion and shortness of breath with increased coughing. Has not taken any medications for symptoms. Denies any fever. States that her significant other has similar symptoms. Sore throat 10/10, aggravated with coughing. Denies any shortness of breath during visit.  Denies any chest pain,  HA or dizziness. Denies any blurred vision, numbness or tingling.   Review of Systems  Constitutional:  Positive for malaise/fatigue. Negative for chills and fever.  HENT:  Positive for congestion and sore throat. Negative for ear discharge, ear pain, hearing loss, nosebleeds, sinus pain and tinnitus.   Eyes: Negative.   Respiratory:  Positive for cough. Negative for shortness of breath and stridor.   Cardiovascular:  Negative for chest pain, palpitations and leg swelling.  Gastrointestinal:  Negative for abdominal pain, blood in stool, constipation, diarrhea, nausea and vomiting.  Genitourinary: Negative.   Musculoskeletal: Negative.   Skin: Negative.   Neurological: Negative.   Psychiatric/Behavioral:  Negative for depression. The patient is not nervous/anxious.   All other systems reviewed and are  negative.    Observations/Objective: Virtual visit no exam   Assessment and Plan: 1. Upper respiratory tract infection, unspecified type - benzonatate (TESSALON) 200 MG capsule; Take 1 capsule (200 mg total) by mouth 2 (two) times daily as needed for cough.  Dispense: 20 capsule; Refill: 0  Patient informed to take OTC medications as needed  Discussed OTC options that may assist with management of symptoms  Discussed the importance of appropriate hydration Given ED/ return clinic precautions  2. Throat pain - lidocaine (XYLOCAINE) 2 % solution; Use as directed 15 mLs in the mouth or throat every 3 (three) hours as needed for mouth pain (throat pain).  Dispense: 100 mL; Refill: 0   Follow Up Instructions: Follow up in 1-2 wks, if symptoms worsen or progress, sooner as needed    I discussed the assessment and treatment plan with the patient. The patient was provided an opportunity to ask questions and all were answered. The patient agreed with the plan and demonstrated an understanding of the instructions.   The patient was advised to call back or seek an in-person evaluation if the symptoms worsen or if the condition fails to improve as anticipated.  I provided 20 minutes of video- visit time during this encounter.   Kathrynn Speed, NP

## 2021-11-25 ENCOUNTER — Other Ambulatory Visit: Payer: Self-pay | Admitting: Nurse Practitioner

## 2021-11-25 ENCOUNTER — Telehealth: Payer: Self-pay

## 2021-11-25 DIAGNOSIS — J069 Acute upper respiratory infection, unspecified: Secondary | ICD-10-CM

## 2021-11-25 MED ORDER — DM-GUAIFENESIN ER 30-600 MG PO TB12
1.0000 | ORAL_TABLET | Freq: Two times a day (BID) | ORAL | 0 refills | Status: AC | PRN
Start: 2021-11-25 — End: 2021-12-09

## 2021-11-25 NOTE — Telephone Encounter (Addendum)
Oxycodone  Mirtazapine

## 2021-11-25 NOTE — Telephone Encounter (Signed)
Patient called office stating her cough of worse than yesterday during her visit. She did start medications sent in, her sore throat is better. Patient stated she is now also having diarrhea. Please advise thanks

## 2021-11-25 NOTE — Telephone Encounter (Incomplete Revision)
Oxycodone  Mirtazapine 

## 2021-11-26 ENCOUNTER — Other Ambulatory Visit: Payer: Self-pay | Admitting: Nurse Practitioner

## 2021-11-26 DIAGNOSIS — D5720 Sickle-cell/Hb-C disease without crisis: Secondary | ICD-10-CM

## 2021-11-26 DIAGNOSIS — G894 Chronic pain syndrome: Secondary | ICD-10-CM

## 2021-11-26 MED ORDER — OXYCODONE HCL 15 MG PO TABS: 15.0000 mg | ORAL_TABLET | Freq: Four times a day (QID) | ORAL | 0 refills | Status: DC | PRN

## 2021-12-08 ENCOUNTER — Telehealth: Payer: Self-pay

## 2021-12-08 NOTE — Telephone Encounter (Signed)
Oxycodone  °

## 2021-12-17 NOTE — Telephone Encounter (Signed)
Patient requesting Oxycodone refill, she has an appt on 1/9

## 2021-12-22 ENCOUNTER — Encounter (HOSPITAL_COMMUNITY): Payer: Self-pay

## 2021-12-22 ENCOUNTER — Emergency Department (HOSPITAL_COMMUNITY)
Admission: EM | Admit: 2021-12-22 | Discharge: 2021-12-23 | Disposition: A | Discharge status: Home / Self Care | Payer: Medicaid Other | Attending: Emergency Medicine | Admitting: Emergency Medicine

## 2021-12-22 ENCOUNTER — Emergency Department (HOSPITAL_COMMUNITY): Payer: Medicaid Other

## 2021-12-22 ENCOUNTER — Other Ambulatory Visit: Payer: Self-pay

## 2021-12-22 DIAGNOSIS — D57219 Sickle-cell/Hb-C disease with crisis, unspecified: Secondary | ICD-10-CM | POA: Diagnosis present

## 2021-12-22 DIAGNOSIS — Z20822 Contact with and (suspected) exposure to covid-19: Secondary | ICD-10-CM | POA: Insufficient documentation

## 2021-12-22 DIAGNOSIS — Z7951 Long term (current) use of inhaled steroids: Secondary | ICD-10-CM | POA: Insufficient documentation

## 2021-12-22 DIAGNOSIS — J101 Influenza due to other identified influenza virus with other respiratory manifestations: Secondary | ICD-10-CM | POA: Diagnosis not present

## 2021-12-22 DIAGNOSIS — D5700 Hb-SS disease with crisis, unspecified: Secondary | ICD-10-CM

## 2021-12-22 LAB — COMPREHENSIVE METABOLIC PANEL
ALT: 13 U/L (ref 0–44)
AST: 25 U/L (ref 15–41)
Albumin: 3.5 g/dL (ref 3.5–5.0)
Alkaline Phosphatase: 45 U/L (ref 38–126)
Anion gap: 5 (ref 5–15)
BUN: 5 mg/dL — ABNORMAL LOW (ref 6–20)
CO2: 23 mmol/L (ref 22–32)
Calcium: 8.1 mg/dL — ABNORMAL LOW (ref 8.9–10.3)
Chloride: 106 mmol/L (ref 98–111)
Creatinine, Ser: 0.95 mg/dL (ref 0.44–1.00)
GFR, Estimated: >60 mL/min (ref >60)
Glucose, Bld: 119 mg/dL — ABNORMAL HIGH (ref 70–99)
Potassium: 3.3 mmol/L — ABNORMAL LOW (ref 3.5–5.1)
Sodium: 134 mmol/L — ABNORMAL LOW (ref 135–145)
Total Bilirubin: 0.9 mg/dL (ref 0.3–1.2)
Total Protein: 6.7 g/dL (ref 6.5–8.1)

## 2021-12-22 LAB — CBC WITH DIFFERENTIAL/PLATELET
Abs Immature Granulocytes: 0.05 10*3/uL (ref 0.00–0.07)
Basophils Absolute: 0.1 10*3/uL (ref 0.0–0.1)
Basophils Relative: 1 %
Eosinophils Absolute: 0.4 10*3/uL (ref 0.0–0.5)
Eosinophils Relative: 4 %
HCT: 27.1 % — ABNORMAL LOW (ref 36.0–46.0)
Hemoglobin: 10 g/dL — ABNORMAL LOW (ref 12.0–15.0)
Immature Granulocytes: 1 %
Lymphocytes Relative: 15 %
Lymphs Abs: 1.4 10*3/uL (ref 0.7–4.0)
MCH: 27.4 pg (ref 26.0–34.0)
MCHC: 36.9 g/dL — ABNORMAL HIGH (ref 30.0–36.0)
MCV: 74.2 fL — ABNORMAL LOW (ref 80.0–100.0)
Monocytes Absolute: 1.9 10*3/uL — ABNORMAL HIGH (ref 0.1–1.0)
Monocytes Relative: 20 %
Neutro Abs: 5.8 10*3/uL (ref 1.7–7.7)
Neutrophils Relative %: 59 %
Platelets: 262 10*3/uL (ref 150–400)
RBC: 3.65 MIL/uL — ABNORMAL LOW (ref 3.87–5.11)
RDW: 13.8 % (ref 11.5–15.5)
WBC: 9.7 10*3/uL (ref 4.0–10.5)
nRBC: 1.1 % — ABNORMAL HIGH (ref 0.0–0.2)

## 2021-12-22 LAB — RESP PANEL BY RT-PCR (FLU A&B, COVID) ARPGX2
Influenza A by PCR: POSITIVE — AB
Influenza B by PCR: NEGATIVE
SARS Coronavirus 2 by RT PCR: NEGATIVE

## 2021-12-22 LAB — LACTIC ACID, PLASMA: Lactic Acid, Venous: 1.7 mmol/L (ref 0.5–1.9)

## 2021-12-22 LAB — PROTIME-INR
INR: 1 (ref 0.8–1.2)
Prothrombin Time: 13.1 s (ref 11.4–15.2)

## 2021-12-22 LAB — RETICULOCYTES
Immature Retic Fract: 26.7 % — ABNORMAL HIGH (ref 2.3–15.9)
RBC.: 3.64 MIL/uL — ABNORMAL LOW (ref 3.87–5.11)
Retic Count, Absolute: 135 10*3/uL (ref 19.0–186.0)
Retic Ct Pct: 3.7 % — ABNORMAL HIGH (ref 0.4–3.1)

## 2021-12-22 LAB — APTT: aPTT: 25 s (ref 24–36)

## 2021-12-22 LAB — I-STAT BETA HCG BLOOD, ED (MC, WL, AP ONLY): I-stat hCG, quantitative: <5 m[IU]/mL (ref <5)

## 2021-12-22 MED ORDER — SODIUM CHLORIDE 0.9 % IV SOLN
500.0000 mg | INTRAVENOUS | Status: DC
  Filled 2021-12-22: qty 5

## 2021-12-22 MED ORDER — ACETAMINOPHEN 325 MG PO TABS
650.0000 mg | ORAL_TABLET | Freq: Once | ORAL | Status: AC
  Administered 2021-12-22: 650 mg via ORAL
  Filled 2021-12-22: qty 2

## 2021-12-22 MED ORDER — SODIUM CHLORIDE 0.45 % IV SOLN: INTRAVENOUS | Status: DC

## 2021-12-22 MED ORDER — HYDROMORPHONE HCL 2 MG/ML IJ SOLN
2.0000 mg | INTRAMUSCULAR | Status: AC
  Administered 2021-12-22: 2 mg via INTRAVENOUS
  Filled 2021-12-22: qty 1

## 2021-12-22 MED ORDER — KETOROLAC TROMETHAMINE 15 MG/ML IJ SOLN
15.0000 mg | INTRAMUSCULAR | Status: AC
  Administered 2021-12-22: 15 mg via INTRAVENOUS
  Filled 2021-12-22: qty 1

## 2021-12-22 MED ORDER — SODIUM CHLORIDE 0.9 % IV SOLN
2.0000 g | INTRAVENOUS | Status: DC
  Administered 2021-12-22: 2 g via INTRAVENOUS
  Filled 2021-12-22: qty 20

## 2021-12-22 MED ORDER — OSELTAMIVIR PHOSPHATE 75 MG PO CAPS
75.0000 mg | ORAL_CAPSULE | Freq: Once | ORAL | Status: AC
  Administered 2021-12-22: 75 mg via ORAL
  Filled 2021-12-22: qty 1

## 2021-12-22 NOTE — ED Provider Notes (Addendum)
[place] EMERGENCY DEPARTMENT Provider Note    CSN: [id number] Arrival date & time: 12/22/21 1953  History Chief Complaint  Patient presents with   Sickle Cell Pain Crisis    Hannah Hawkins is a 36 y.o. female with history of sickle cell disease reports she was doing well on her home medications until this morning. She reports severe aching diffuse muscle pains and fever started this morning. She is out of her home pain medications. She reports she has had a cough but no other infectious symptoms.     Home Medications Prior to Admission medications   Medication Sig Start Date End Date Taking? Authorizing Provider  benzonatate (TESSALON) 200 MG capsule Take 1 capsule (200 mg total) by mouth 2 (two) times daily as needed for cough. 11/24/21   Passmore, Jake Church I, NP  fluticasone (FLONASE) 50 MCG/ACT nasal spray Place 2 sprays into both nostrils daily. 08/14/21   Vevelyn Francois, NP  lidocaine (LIDODERM) 5 % Place 1 patch onto the skin daily. Remove & Discard patch within 12 hours or as directed by MD Patient not taking: Reported on 11/24/2021 06/19/21   Vevelyn Francois, NP  lidocaine (XYLOCAINE) 2 % solution Use as directed 15 mLs in the mouth or throat every 3 (three) hours as needed for mouth pain (throat pain). 11/24/21   Passmore, Jake Church I, NP  mirtazapine (REMERON) 15 MG tablet Take 1 tablet (15 mg total) by mouth at bedtime. 09/07/21 09/07/22  Vevelyn Francois, NP  montelukast (SINGULAIR) 10 MG tablet Take 1 tablet (10 mg total) by mouth at bedtime. 08/14/21   Vevelyn Francois, NP  NARCAN 4 MG/0.1ML LIQD nasal spray kit Place 4 mg into the nose once as needed (overdose). 12/22/17   [provider]  norethindrone (MICRONOR) 0.35 MG tablet Take 1 tablet (0.35 mg total) by mouth daily. 09/08/21   Vevelyn Francois, NP  pantoprazole (PROTONIX) 40 MG tablet Take 1 tablet (40 mg total) by mouth daily. Patient not taking: Reported on 11/24/2021 09/07/21 09/07/22  Vevelyn Francois, NP   tiZANidine (ZANAFLEX) 2 MG tablet Take 1 tablet (2 mg total) by mouth every 6 (six) hours as needed for muscle spasms. Patient not taking: Reported on 09/07/2021 05/28/21   Vevelyn Francois, NP     Allergies    Penicillins   Review of Systems   Review of Systems Please see HPI for pertinent positives and negatives  Physical Exam BP 90/70    Pulse 91    Temp (!) 102.6 F (39.2 C) (Oral)    Resp (!) 22    SpO2 96%   Physical Exam Vitals and nursing note reviewed.  Constitutional:      Appearance: Normal appearance.  HENT:     Head: Normocephalic and atraumatic.     Nose: Nose normal.     Mouth/Throat:     Mouth: Mucous membranes are moist.  Eyes:     Extraocular Movements: Extraocular movements intact.     Conjunctiva/sclera: Conjunctivae normal.  Cardiovascular:     Rate and Rhythm: Normal rate.  Pulmonary:     Effort: Pulmonary effort is normal.     Breath sounds: Normal breath sounds. No wheezing or rales.     Comments: Dry cough Abdominal:     General: Abdomen is flat.     Palpations: Abdomen is soft.     Tenderness: There is no abdominal tenderness.  Musculoskeletal:        General: Tenderness (diffuse muscle tenderness)  present. No swelling. Normal range of motion.     Cervical back: Neck supple.  Skin:    General: Skin is warm and dry.  Neurological:     General: No focal deficit present.     Mental Status: She is alert.  Psychiatric:        Mood and Affect: Mood normal.    ED Results / Procedures / Treatments   EKG EKG Interpretation  Date/Time:  Tuesday December 22 2021 20:21:30 EST Ventricular Rate:  84 PR Interval:  135 QRS Duration: 74 QT Interval:  359 QTC Calculation: 425 R Axis:   67 Text Interpretation: Sinus rhythm No significant change since last tracing Confirmed by Calvert Cantor [phone]) on 12/22/2021 10:10:05 PM  Procedures Procedures  Medications Ordered in the ED Medications  0.45 % sodium chloride infusion ( Intravenous New  Bag/Given 12/22/21 2345)  cefTRIAXone (ROCEPHIN) 2 g in sodium chloride 0.9 % 100 mL IVPB (0 g Intravenous Stopped 12/22/21 2312)  ketorolac (TORADOL) 15 MG/ML injection 15 mg (15 mg Intravenous Given 12/22/21 2205)  HYDROmorphone (DILAUDID) injection 2 mg (2 mg Intravenous Given 12/22/21 2208)  HYDROmorphone (DILAUDID) injection 2 mg (2 mg Intravenous Given 12/22/21 2346)  acetaminophen (TYLENOL) tablet 650 mg (650 mg Oral Given 12/22/21 2237)  oseltamivir (TAMIFLU) capsule 75 mg (75 mg Oral Given 12/22/21 2317)    Initial Impression and Plan  Patient with known SS disease here with pain crisis and fever. Will check labs, IVF and pain medications per sickle protocol as well as sepsis protocol. Patient's symptoms are respiratory so will begin empiric Abx for respiratory infection pending identification of any other source. She is not particularly tachycardic or hypotensive so doubt this is severe sepsis.   ED Course   Clinical Course as of 12/22/21 2349  Tue Dec 22, 2021  2237 Prelim CBC with Hgb about at baseline. PLT is normal. WBC is pending. Reticulocytes are adequate.  [CS]  2238 HCG is neg.  [CS]  4268 CXR without signs of pneumonia.  [CS]  3419 WBC is normal. CMP is unremarkable. Coags neg.  [CS]  2308 Influenza is positive, likely cause of her fever and cough. Will de-escalate antibiotics. Give a dose of Tamiflu.  [CS]  2312 Patient reports pain improved some. Getting additional doses per protocol.  [CS]  2344 Patient getting IVF now, will need reassessment after she has made urine sample for improvement. Care of the patient will be signed out to the oncoming team at the change of shift.  [CS]    Clinical Course User Index [CS] Truddie Hidden, MD     MDM Rules/Calculators/A&P Medical Decision Making Problems Addressed: Influenza A: acute illness or injury with systemic symptoms Sickle cell pain crisis [facility]): chronic illness or injury with exacerbation, progression, or side effects of  treatment  Amount and/or Complexity of Data Reviewed Labs: ordered. Decision-making details documented in ED Course. Radiology: ordered and independent interpretation performed. Decision-making details documented in ED Course.  Risk Prescription drug management. Parenteral controlled substances.    Final Clinical Impression(s) / ED Diagnoses Final diagnoses:  Sickle cell pain crisis [facility])  Influenza A    Rx / DC Orders ED Discharge Orders     None        Truddie Hidden, MD 12/22/21 2348    Truddie Hidden, MD 12/22/21 [phone]

## 2021-12-22 NOTE — ED Triage Notes (Signed)
Pt reports with [facility] since this morning. Pt complains of pain in both legs.

## 2021-12-23 ENCOUNTER — Other Ambulatory Visit: Payer: Self-pay | Admitting: Nurse Practitioner

## 2021-12-23 DIAGNOSIS — G894 Chronic pain syndrome: Secondary | ICD-10-CM

## 2021-12-23 DIAGNOSIS — D5720 Sickle-cell/Hb-C disease without crisis: Secondary | ICD-10-CM

## 2021-12-23 LAB — URINALYSIS, ROUTINE W REFLEX MICROSCOPIC
Bilirubin Urine: NEGATIVE
Glucose, UA: NEGATIVE mg/dL
Ketones, ur: NEGATIVE mg/dL
Nitrite: NEGATIVE
Protein, ur: 30 mg/dL — AB
Specific Gravity, Urine: 1.014 (ref 1.005–1.030)
Squamous Epithelial / HPF: >50 — ABNORMAL HIGH (ref 0–5)
WBC, UA: >50 WBC/hpf — ABNORMAL HIGH (ref 0–5)
pH: 5 (ref 5.0–8.0)

## 2021-12-23 MED ORDER — OXYCODONE HCL 5 MG PO TABS
10.0000 mg | ORAL_TABLET | Freq: Once | ORAL | Status: AC
  Administered 2021-12-23: 10 mg via ORAL

## 2021-12-23 MED ORDER — OXYCODONE HCL 15 MG PO TABS: 15.0000 mg | ORAL_TABLET | Freq: Four times a day (QID) | ORAL | 0 refills | Status: AC | PRN

## 2021-12-23 MED ORDER — OSELTAMIVIR PHOSPHATE 75 MG PO CAPS: 75.0000 mg | ORAL_CAPSULE | Freq: Two times a day (BID) | ORAL | 0 refills | Status: DC

## 2021-12-23 NOTE — Sepsis Progress Note (Signed)
Following for sepsis monitoring ?

## 2021-12-23 NOTE — Telephone Encounter (Signed)
The refill has been sent. Thanks  ° °

## 2021-12-23 NOTE — ED Provider Notes (Signed)
Patient signed out pending urinalysis and reassessment.  Urinalysis appears dirty with greater than 50 epithelial cells.  She does have white cells and bacteria.  She is currently without urinary symptoms.  Will culture but hold on treatment.  She has already received 1 dose of antibiotics.  She is well-appearing on recheck.  She is influenza positive.  She is not hypoxic.  Requesting 1 additional dose of pain medication.  This was given orally.  She is tolerating fluids.  Feel she is safe for ongoing outpatient management.   Physical Exam  BP 100/64    Pulse 80    Temp (!) 102.6 F (39.2 C) (Oral)    Resp 18    SpO2 99%   Physical Exam  ED Course/Procedures   Clinical Course as of 12/23/21 0355  Tue Dec 22, 2021  2237 Prelim CBC with Hgb about at baseline. PLT is normal. WBC is pending. Reticulocytes are adequate.  [CS]  2238 HCG is neg.  [CS]  2238 CXR without signs of pneumonia.  [CS]  2259 WBC is normal. CMP is unremarkable. Coags neg.  [CS]  2308 Influenza is positive, likely cause of her fever and cough. Will de-escalate antibiotics. Give a dose of Tamiflu.  [CS]  2312 Patient reports pain improved some. Getting additional doses per protocol.  [CS]  2344 Patient getting IVF now, will need reassessment after she has made urine sample for improvement. Care of the patient will be signed out to the oncoming team at the change of shift.  [CS]    Clinical Course User Index [CS] Pollyann Savoy, MD    Procedures  MDM   Problem List Items Addressed This Visit       Other   Sickle cell pain crisis (HCC) - Primary   Other Visit Diagnoses     Influenza A       Relevant Medications   cefTRIAXone (ROCEPHIN) 2 g in sodium chloride 0.9 % 100 mL IVPB   oseltamivir (TAMIFLU) capsule 75 mg (Completed)   oseltamivir (TAMIFLU) 75 MG capsule             Shon Baton, MD 12/23/21 [phone]

## 2021-12-24 LAB — URINE CULTURE

## 2021-12-28 ENCOUNTER — Ambulatory Visit: Payer: Medicaid Other | Admitting: Nurse Practitioner

## 2021-12-28 LAB — CULTURE, BLOOD (ROUTINE X 2)
Culture: NO GROWTH
Culture: NO GROWTH

## 2022-01-06 ENCOUNTER — Ambulatory Visit: Payer: Medicaid Other | Admitting: Nurse Practitioner

## 2022-01-15 ENCOUNTER — Other Ambulatory Visit: Payer: Self-pay

## 2022-01-15 ENCOUNTER — Encounter: Payer: Self-pay | Admitting: Nurse Practitioner

## 2022-01-15 ENCOUNTER — Ambulatory Visit (INDEPENDENT_AMBULATORY_CARE_PROVIDER_SITE_OTHER): Payer: Medicaid Other | Admitting: Nurse Practitioner

## 2022-01-15 VITALS — BP 159/98 | HR 122 | Temp 100.9°F | Ht 63.0 in | Wt 129.2 lb

## 2022-01-15 DIAGNOSIS — Z20822 Contact with and (suspected) exposure to covid-19: Secondary | ICD-10-CM | POA: Diagnosis not present

## 2022-01-15 DIAGNOSIS — G894 Chronic pain syndrome: Secondary | ICD-10-CM | POA: Diagnosis not present

## 2022-01-15 DIAGNOSIS — D5720 Sickle-cell/Hb-C disease without crisis: Secondary | ICD-10-CM

## 2022-01-15 DIAGNOSIS — R509 Fever, unspecified: Secondary | ICD-10-CM | POA: Diagnosis not present

## 2022-01-15 DIAGNOSIS — R000 Tachycardia, unspecified: Secondary | ICD-10-CM

## 2022-01-15 DIAGNOSIS — R319 Hematuria, unspecified: Secondary | ICD-10-CM

## 2022-01-15 LAB — POCT URINALYSIS DIP (CLINITEK)
Bilirubin, UA: NEGATIVE
Glucose, UA: NEGATIVE mg/dL
Ketones, POC UA: NEGATIVE mg/dL
Leukocytes, UA: NEGATIVE
Nitrite, UA: NEGATIVE
POC PROTEIN,UA: NEGATIVE
Spec Grav, UA: >=1.03 — AB (ref 1.010–1.025)
Urobilinogen, UA: 0.2 E.U./dL
pH, UA: 6 (ref 5.0–8.0)

## 2022-01-15 MED ORDER — NIRMATRELVIR/RITONAVIR (PAXLOVID)TABLET: 3.0000 | ORAL_TABLET | Freq: Two times a day (BID) | ORAL | 0 refills | Status: AC

## 2022-01-15 MED ORDER — OXYCODONE HCL 15 MG PO TABS: 15.0000 mg | ORAL_TABLET | Freq: Four times a day (QID) | ORAL | 0 refills | Status: DC | PRN

## 2022-01-15 NOTE — Progress Notes (Signed)
[address], [place]  [address] Phone:  [phone]   Fax:  [phone]   Established Patient Office Visit  Subjective:  Patient ID: Hannah Hawkins, female    DOB: Jul 01, 1986  Age: 36 y.o. MRN: [id number]  CC:  Chief Complaint  Patient presents with   Follow-up    Pt is here today for her follow up visit with no concerns or issues to discuss.    HPI Hannah Hawkins presents for follow up. She  has a past medical history of Sickle cell anemia ([place]).   She reports that her daughter was home this week in quarantine for [place]. Denies fever; current temp; 100.9 , with a headache and this has been ongoing for a few days. Her BP is elevated but she does not feel bad, [place], cough, wheezing, shortness of breath, chest pains, abdominal pain, back pain, hip pain, or leg pain. Denies any open wounds, skin irritation.    Past Medical History:  Diagnosis Date   Sickle cell anemia ([place])     Past Surgical History:  Procedure Laterality Date   CHOLECYSTECTOMY N/A 06/03/2021   Procedure: LAPAROSCOPIC CHOLECYSTECTOMY;  Surgeon: Coralie Keens, MD;  Location: [facility];  Service: General;  Laterality: N/A;   EYE SURGERY  03/09/2017   left eye   EYE SURGERY Right 09/2019   right [place]     Family History  Problem Relation Age of Onset   Diabetes Father     Social History   Socioeconomic History   Marital status: Single    Spouse name: Not on file   Number of children: Not on file   Years of education: Not on file   Highest education level: Not on file  Occupational History   Not on file  Tobacco Use   Smoking status: Former    Types: Cigarettes   Smokeless tobacco: Never  Vaping Use   Vaping Use: Every day   Substances: Flavoring  Substance and Sexual Activity   Alcohol use: Not Currently   Drug use: Not Currently    Types: Marijuana   Sexual activity: Yes  Other Topics Concern   Not on file  Social History Narrative    Not on file   Social Determinants of Health   Financial Resource Strain: Not on file  Food Insecurity: Not on file  Transportation Needs: Not on file  Physical Activity: Not on file  Stress: Not on file  Social Connections: Not on file  Intimate Partner Violence: Not on file    Outpatient Medications Prior to Visit  Medication Sig Dispense Refill   benzonatate (TESSALON) 200 MG capsule Take 1 capsule (200 mg total) by mouth 2 (two) times daily as needed for cough. 20 capsule 0   fluticasone (FLONASE) 50 MCG/ACT nasal spray Place 2 sprays into both nostrils daily. 16 g 2   ibuprofen (ADVIL) 800 MG tablet Take by mouth daily.     lidocaine (XYLOCAINE) 2 % solution Use as directed 15 mLs in the mouth or throat every 3 (three) hours as needed for mouth pain (throat pain). 100 mL 0   mirtazapine (REMERON) 15 MG tablet Take 1 tablet (15 mg total) by mouth at bedtime. 30 tablet 11   montelukast (SINGULAIR) 10 MG tablet Take 1 tablet (10 mg total) by mouth at bedtime. 30 tablet 3   NARCAN 4 MG/0.1ML LIQD nasal spray kit Place 4 mg into the nose once as needed (overdose).  0  norethindrone (MICRONOR) 0.35 MG tablet Take 1 tablet (0.35 mg total) by mouth daily. 28 tablet 11   lidocaine (LIDODERM) 5 % Place 1 patch onto the skin daily. Remove & Discard patch within 12 hours or as directed by MD (Patient not taking: Reported on 11/24/2021) 30 patch 2   ondansetron (ZOFRAN) 8 MG tablet Take by mouth.     oseltamivir (TAMIFLU) 75 MG capsule Take 1 capsule (75 mg total) by mouth every 12 (twelve) hours. (Patient not taking: Reported on 01/15/2022) 10 capsule 0   pantoprazole (PROTONIX) 40 MG tablet Take 1 tablet (40 mg total) by mouth daily. (Patient not taking: Reported on 11/24/2021) 30 tablet 11   tiZANidine (ZANAFLEX) 2 MG tablet Take 1 tablet (2 mg total) by mouth every 6 (six) hours as needed for muscle spasms. (Patient not taking: Reported on 09/07/2021) 45 tablet 2   No facility-administered  medications prior to visit.    Allergies  Allergen Reactions   Penicillins Swelling and Rash    Has patient had a PCN reaction causing immediate rash, facial/tongue/throat swelling, SOB or lightheadedness with hypotension: Yes Has patient had a PCN reaction causing severe rash involving mucus membranes or skin necrosis: Yes Has patient had a PCN reaction that required hospitalization No Has patient had a PCN reaction occurring within the last 10 years: no If all of the above answers are "NO", then may proceed with Cephalosporin use.     ROS Review of Systems    Objective:    Physical Exam Constitutional:      General: She is not in acute distress. Cardiovascular:     Rate and Rhythm: Regular rhythm. Tachycardia present.     Heart sounds: Normal heart sounds.  Pulmonary:     Effort: Pulmonary effort is normal.     Breath sounds: Normal breath sounds. No wheezing or rhonchi.  Skin:    Capillary Refill: Capillary refill takes less than 2 seconds.     Comments: Febrile/warmth    BP (!) 159/98    Pulse (!) 122    Temp (!) 100.9 F (38.3 C)    Ht _0  (1.6 m)    Wt 129 lb 3.2 oz (58.6 kg)    SpO2 100%    BMI 22.89 kg/m  Wt Readings from Last 3 Encounters:  01/15/22 129 lb 3.2 oz (58.6 kg)  08/13/21 135 lb (61.2 kg)  07/29/21 139 lb 15.9 oz (63.5 kg)     Health Maintenance Due  Topic Date Due   COVID-19 Vaccine (1) Never done    There are no preventive care reminders to display for this patient.  Lab Results  Component Value Date   TSH 1.940 07/24/2020   Lab Results  Component Value Date   WBC 9.7 12/22/2021   HGB 10.0 (L) 12/22/2021   HCT 27.1 (L) 12/22/2021   MCV 74.2 (L) 12/22/2021   PLT 262 12/22/2021   Lab Results  Component Value Date   NA 134 (L) 12/22/2021   K 3.3 (L) 12/22/2021   CO2 23 12/22/2021   GLUCOSE 119 (H) 12/22/2021   BUN 5 (L) 12/22/2021   CREATININE 0.95 12/22/2021   BILITOT 0.9 12/22/2021   ALKPHOS 45 12/22/2021   AST 25  12/22/2021   ALT 13 12/22/2021   PROT 6.7 12/22/2021   ALBUMIN 3.5 12/22/2021   CALCIUM 8.1 (L) 12/22/2021   ANIONGAP 5 12/22/2021   Lab Results  Component Value Date   CHOL 113 08/07/2020   Lab Results  Component Value Date   HDL 38 (L) 08/07/2020   Lab Results  Component Value Date   LDLCALC 65 08/07/2020   Lab Results  Component Value Date   TRIG 52 08/07/2020   Lab Results  Component Value Date   CHOLHDL 3.0 08/07/2020   No results found for: HGBA1C    Assessment & Plan:   Problem List Items Addressed This Visit       Other   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 Move frequently to reduce venous thromboembolism risk. Avoid situations that could lead to dehydration or could exacerbate pain Discussed S&S of infection, seizures, stroke acute chest, DVT and how important it is to seek medical attention Take medication as directed along with pain contract and overall compliance Discussed the risk related to opiate use (addition, tolerance and dependency)    Relevant Medications   oxyCODONE (ROXICODONE) 15 MG immediate release tablet   Other Relevant Orders   POCT URINALYSIS DIP (CLINITEK) (Completed)   [id number] 11+Oxyco+Alc+Crt-Bund   Cocaine use   Chronic pain syndrome   Relevant Medications   ibuprofen (ADVIL) 800 MG tablet   oxyCODONE (ROXICODONE) 15 MG immediate release tablet   Other Relevant Orders   [id number] 11+Oxyco+Alc+Crt-Bund   Other Visit Diagnoses     Exposure to COVID-19 virus     Encouraged patient to go to the ED due to her current vitals and her SCD.  Did not want to go due to the long wait Paxlovid prescribed as directed; due to patient underlying history of SCD   Fever, unspecified fever cause       Hematuria, unspecified type       Relevant Orders   Urine Culture   Tachycardia Possible related to fever  Encourage ED for evaluation Pt declined     Meds ordered this encounter  Medications    nirmatrelvir/ritonavir EUA (PAXLOVID) 20 x 150 MG & 10 x 100MG TABS    Sig: Take 3 tablets by mouth 2 (two) times daily for 5 days. (Take nirmatrelvir 150 mg two tablets twice daily for 5 days and ritonavir 100 mg one tablet twice daily for 5 days) Patient GFR is 0.95.    Dispense:  30 tablet    Refill:  0    Order Specific Question:   Supervising Provider    Answer:   Tresa Garter [[id number]]   oxyCODONE (ROXICODONE) 15 MG immediate release tablet    Sig: Take 1 tablet (15 mg total) by mouth every 6 (six) hours as needed for up to 15 days for pain (moderate to severe pain).    Dispense:  60 tablet    Refill:  0    Order Specific Question:   Supervising Provider    Answer:   Tresa Garter [id number]    Follow-up: Return in about 4 weeks (around 02/12/2022).    Vevelyn Francois, NP

## 2022-01-17 LAB — DRUG SCREEN 764883 11+OXYCO+ALC+CRT-BUND
Amphetamines, Urine: NEGATIVE ng/mL
BENZODIAZ UR QL: NEGATIVE ng/mL
Barbiturate: NEGATIVE ng/mL
Cannabinoid Quant, Ur: NEGATIVE ng/mL
Cocaine (Metabolite): NEGATIVE ng/mL
Creatinine: 160 mg/dL (ref 20.0–300.0)
Ethanol: NEGATIVE %
Meperidine: NEGATIVE ng/mL
Methadone Screen, Urine: NEGATIVE ng/mL
OPIATE SCREEN URINE: NEGATIVE ng/mL
Oxycodone/Oxymorphone, Urine: NEGATIVE ng/mL
Phencyclidine: NEGATIVE ng/mL
Propoxyphene: NEGATIVE ng/mL
Tramadol: NEGATIVE ng/mL
pH, Urine: 5.9 (ref 4.5–8.9)

## 2022-01-17 LAB — URINE CULTURE

## 2022-01-21 ENCOUNTER — Emergency Department (HOSPITAL_COMMUNITY)
Admission: EM | Admit: 2022-01-21 | Discharge: 2022-01-22 | Disposition: A | Discharge status: Home / Self Care | Payer: Medicaid Other | Attending: Emergency Medicine | Admitting: Emergency Medicine

## 2022-01-21 ENCOUNTER — Encounter (HOSPITAL_COMMUNITY): Payer: Self-pay

## 2022-01-21 ENCOUNTER — Other Ambulatory Visit: Payer: Self-pay

## 2022-01-21 DIAGNOSIS — D5700 Hb-SS disease with crisis, unspecified: Secondary | ICD-10-CM | POA: Diagnosis present

## 2022-01-21 LAB — COMPREHENSIVE METABOLIC PANEL
ALT: 27 U/L (ref 0–44)
AST: 39 U/L (ref 15–41)
Albumin: 4.1 g/dL (ref 3.5–5.0)
Alkaline Phosphatase: 63 U/L (ref 38–126)
Anion gap: 7 (ref 5–15)
BUN: 9 mg/dL (ref 6–20)
CO2: 30 mmol/L (ref 22–32)
Calcium: 8.7 mg/dL — ABNORMAL LOW (ref 8.9–10.3)
Chloride: 99 mmol/L (ref 98–111)
Creatinine, Ser: 0.85 mg/dL (ref 0.44–1.00)
GFR, Estimated: >60 mL/min (ref >60)
Glucose, Bld: 139 mg/dL — ABNORMAL HIGH (ref 70–99)
Potassium: 4 mmol/L (ref 3.5–5.1)
Sodium: 136 mmol/L (ref 135–145)
Total Bilirubin: 1 mg/dL (ref 0.3–1.2)
Total Protein: 7.4 g/dL (ref 6.5–8.1)

## 2022-01-21 LAB — RETICULOCYTES
Immature Retic Fract: 42.2 % — ABNORMAL HIGH (ref 2.3–15.9)
RBC.: 3.97 MIL/uL (ref 3.87–5.11)
Retic Count, Absolute: 236 10*3/uL — ABNORMAL HIGH (ref 19.0–186.0)
Retic Ct Pct: 6 % — ABNORMAL HIGH (ref 0.4–3.1)

## 2022-01-21 LAB — CBC WITH DIFFERENTIAL/PLATELET
Abs Immature Granulocytes: 0.38 10*3/uL — ABNORMAL HIGH (ref 0.00–0.07)
Basophils Absolute: 0.1 10*3/uL (ref 0.0–0.1)
Basophils Relative: 0 %
Eosinophils Absolute: 0.1 10*3/uL (ref 0.0–0.5)
Eosinophils Relative: 0 %
HCT: 31 % — ABNORMAL LOW (ref 36.0–46.0)
Hemoglobin: 11.4 g/dL — ABNORMAL LOW (ref 12.0–15.0)
Immature Granulocytes: 2 %
Lymphocytes Relative: 7 %
Lymphs Abs: 1.5 10*3/uL (ref 0.7–4.0)
MCH: 27.8 pg (ref 26.0–34.0)
MCHC: 36.8 g/dL — ABNORMAL HIGH (ref 30.0–36.0)
MCV: 75.6 fL — ABNORMAL LOW (ref 80.0–100.0)
Monocytes Absolute: 2 10*3/uL — ABNORMAL HIGH (ref 0.1–1.0)
Monocytes Relative: 9 %
Neutro Abs: 18 10*3/uL — ABNORMAL HIGH (ref 1.7–7.7)
Neutrophils Relative %: 82 %
Platelets: 341 10*3/uL (ref 150–400)
RBC: 4.1 MIL/uL (ref 3.87–5.11)
RDW: 15.2 % (ref 11.5–15.5)
WBC: 24 10*3/uL — ABNORMAL HIGH (ref 4.0–10.5)
nRBC: 0.2 % (ref 0.0–0.2)

## 2022-01-21 MED ORDER — SODIUM CHLORIDE 0.45 % IV SOLN: INTRAVENOUS | Status: DC

## 2022-01-21 MED ORDER — ONDANSETRON HCL 4 MG/2ML IJ SOLN
4.0000 mg | INTRAMUSCULAR | Status: DC | PRN
  Administered 2022-01-21: 4 mg via INTRAVENOUS
  Filled 2022-01-21 (×2): qty 2

## 2022-01-21 MED ORDER — HYDROMORPHONE HCL 2 MG/ML IJ SOLN
2.0000 mg | Freq: Once | INTRAMUSCULAR | Status: AC
  Administered 2022-01-21: 2 mg via INTRAVENOUS
  Filled 2022-01-21: qty 1

## 2022-01-21 MED ORDER — KETOROLAC TROMETHAMINE 15 MG/ML IJ SOLN
15.0000 mg | INTRAMUSCULAR | Status: AC
  Administered 2022-01-21: 15 mg via INTRAVENOUS
  Filled 2022-01-21: qty 1

## 2022-01-21 MED ORDER — HYDROMORPHONE HCL 2 MG/ML IJ SOLN
2.0000 mg | Freq: Once | INTRAMUSCULAR | Status: AC
  Administered 2022-01-21: 2 mg via INTRAMUSCULAR
  Filled 2022-01-21: qty 1

## 2022-01-21 NOTE — ED Provider Notes (Signed)
°  Provider Note MRN:  [id number]  Arrival date & time: 01/22/22    ED Course and Medical Decision Making  Assumed care from Dr. Anitra Lauth at shift change.  Sickle cell pain crisis, has leukocytosis but no infectious symptoms, no fever, overall feels well other than bilateral arm pain.  Providing pain medicine and will reassess.  On reassessment patient feeling a bit better, still having a fair amount of pain.  Admission offered but patient prefers to go home, has a daughter at home that needs to be cared for.  Return precautions for any worsening of condition.  Procedures  Final Clinical Impressions(s) / ED Diagnoses     ICD-10-CM   1. Sickle cell pain crisis (HCC)  D57.00       ED Discharge Orders     None         Discharge Instructions      You were evaluated in the Emergency Department and after careful evaluation, we did not find any emergent condition requiring admission or further testing in the hospital.  Your exam/testing today was overall reassuring.  Please return to the Emergency Department if you experience any worsening of your condition.  Thank you for allowing [place] to be a part of your care.       Elmer Sow. Pilar Plate, MD [email or web address] [email or web address]    Sabas Sous, MD 01/22/22 [phone]

## 2022-01-21 NOTE — ED Triage Notes (Signed)
Pt BIB EMS from home with complaint of Sickle cell pain crisis x 2 days. Pt reporting pins and needles pain in arms and legs. Last Oxycodone taken at 5pm   EMS vitals: BP 140/86 HR 90

## 2022-01-21 NOTE — ED Provider Notes (Signed)
[facility] DEPT Provider Note   CSN: [id number] Arrival date & time: 01/21/22  2000     History  Chief Complaint  Patient presents with   Sickle Cell Pain Crisis    Hannah Hawkins is a 36 y.o. female.  Patient is a 36 year old female with a history of sickle cell disease who is presenting today with complaint of sickle cell pain crisis in her upper arms.  She reports the pain started yesterday.  It is a tight burning tingling kind of pain in both arms.  It is not gotten better with ibuprofen and oxycodone at home.  She is still taking the paxlovid she was given because her daughter was COVID-positive and she was having symptoms last week but she reports the symptoms have gone away.  She has not had any fever, chest pain, shortness of breath.  She is not having vomiting or diarrhea.  She received 1 dose of pain medication prior to being bedded and reports that it helped a little bit initially but now the pain is coming back and she is requesting a second dose.  The history is provided by the patient and medical records.  Sickle Cell Pain Crisis     Home Medications Prior to Admission medications   Medication Sig Start Date End Date Taking? Authorizing Provider  ibuprofen (ADVIL) 800 MG tablet Take 800 mg by mouth daily as needed for moderate pain. 12/24/21  Yes [provider]  lidocaine (LIDODERM) 5 % Place 1 patch onto the skin daily. Remove & Discard patch within 12 hours or as directed by MD 06/19/21  Yes Vevelyn Francois, NP  NARCAN 4 MG/0.1ML LIQD nasal spray kit Place 4 mg into the nose once as needed (overdose). 12/22/17  Yes [provider]  norethindrone (MICRONOR) 0.35 MG tablet Take 1 tablet (0.35 mg total) by mouth daily. 09/08/21  Yes Vevelyn Francois, NP  ondansetron (ZOFRAN) 8 MG tablet Take 8 mg by mouth every 8 (eight) hours as needed for nausea or vomiting. 12/24/21  Yes [provider]  oxyCODONE (ROXICODONE) 15 MG  immediate release tablet Take 1 tablet (15 mg total) by mouth every 6 (six) hours as needed for up to 15 days for pain (moderate to severe pain). Patient taking differently: Take 15 mg by mouth every 6 (six) hours as needed for pain. 01/15/22 01/30/22 Yes King, Diona Foley, NP  benzonatate (TESSALON) 200 MG capsule Take 1 capsule (200 mg total) by mouth 2 (two) times daily as needed for cough. Patient not taking: Reported on 01/21/2022 11/24/21   Bo Merino I, NP  fluticasone (FLONASE) 50 MCG/ACT nasal spray Place 2 sprays into both nostrils daily. Patient not taking: Reported on 01/21/2022 08/14/21   Vevelyn Francois, NP  lidocaine (XYLOCAINE) 2 % solution Use as directed 15 mLs in the mouth or throat every 3 (three) hours as needed for mouth pain (throat pain). Patient not taking: Reported on 01/21/2022 11/24/21   Bo Merino I, NP  mirtazapine (REMERON) 15 MG tablet Take 1 tablet (15 mg total) by mouth at bedtime. Patient not taking: Reported on 01/21/2022 09/07/21 09/07/22  Vevelyn Francois, NP  montelukast (SINGULAIR) 10 MG tablet Take 1 tablet (10 mg total) by mouth at bedtime. Patient not taking: Reported on 01/21/2022 08/14/21   Vevelyn Francois, NP  oseltamivir (TAMIFLU) 75 MG capsule Take 1 capsule (75 mg total) by mouth every 12 (twelve) hours. Patient not taking: Reported on 01/15/2022 12/23/21   Horton,  Barbette Hair, MD  pantoprazole (PROTONIX) 40 MG tablet Take 1 tablet (40 mg total) by mouth daily. Patient not taking: Reported on 11/24/2021 09/07/21 09/07/22  Vevelyn Francois, NP  tiZANidine (ZANAFLEX) 2 MG tablet Take 1 tablet (2 mg total) by mouth every 6 (six) hours as needed for muscle spasms. Patient not taking: Reported on 09/07/2021 05/28/21   Vevelyn Francois, NP      Allergies    Penicillins    Review of Systems   Review of Systems  Physical Exam Updated Vital Signs BP (!) 130/96 (BP Location: Left Arm)    Pulse 100    Temp (!) 97.4 F (36.3 C) (Oral)    Resp 18    Ht '5\' 3"'  (1.6 m)    Wt  59 kg    SpO2 98%    BMI 23.03 kg/m  Physical Exam Vitals and nursing note reviewed.  Constitutional:      General: She is not in acute distress.    Appearance: She is well-developed.  HENT:     Head: Normocephalic and atraumatic.  Eyes:     Pupils: Pupils are equal, round, and reactive to light.  Cardiovascular:     Rate and Rhythm: Normal rate and regular rhythm.     Heart sounds: Normal heart sounds. No murmur heard.   No friction rub.  Pulmonary:     Effort: Pulmonary effort is normal.     Breath sounds: Normal breath sounds. No wheezing or rales.  Abdominal:     General: Bowel sounds are normal. There is no distension.     Palpations: Abdomen is soft.     Tenderness: There is no abdominal tenderness. There is no guarding or rebound.  Musculoskeletal:        General: No tenderness. Normal range of motion.     Comments: No edema.  Tenderness with palpation in bilateral shoulders.  Skin:    General: Skin is warm and dry.     Findings: No rash.  Neurological:     Mental Status: She is alert and oriented to person, place, and time. Mental status is at baseline.     Cranial Nerves: No cranial nerve deficit.  Psychiatric:        Mood and Affect: Mood normal.        Behavior: Behavior normal.    ED Results / Procedures / Treatments   Labs (all labs ordered are listed, but only abnormal results are displayed) Labs Reviewed  RETICULOCYTES - Abnormal; Notable for the following components:      Result Value   Retic Ct Pct 6.0 (*)    Retic Count, Absolute 236.0 (*)    Immature Retic Fract 42.2 (*)    All other components within normal limits  COMPREHENSIVE METABOLIC PANEL - Abnormal; Notable for the following components:   Glucose, Bld 139 (*)    Calcium 8.7 (*)    All other components within normal limits  CBC WITH DIFFERENTIAL/PLATELET - Abnormal; Notable for the following components:   WBC 24.0 (*)    Hemoglobin 11.4 (*)    HCT 31.0 (*)    MCV 75.6 (*)    MCHC 36.8  (*)    Neutro Abs 18.0 (*)    Monocytes Absolute 2.0 (*)    Abs Immature Granulocytes 0.38 (*)    All other components within normal limits    EKG None  Radiology No results found.  Procedures Procedures    Medications Ordered in ED Medications  0.45 %  sodium chloride infusion (has no administration in time range)  ondansetron (ZOFRAN) injection 4 mg (4 mg Intravenous Given 01/21/22 2105)  HYDROmorphone (DILAUDID) injection 2 mg (has no administration in time range)  HYDROmorphone (DILAUDID) injection 2 mg (2 mg Intramuscular Given 01/21/22 2105)  ketorolac (TORADOL) 15 MG/ML injection 15 mg (15 mg Intravenous Given 01/21/22 2105)    ED Course/ Medical Decision Making/ A&P                           Medical Decision Making Risk Prescription drug management.   Patient with a history of sickle cell disease who is presenting today with complaints of bilateral arm pain concerning for sickle cell crisis.  She reports this feels similar to her prior sickle cell crisis.  External medical records reviewed from her recent visit to her hematologist.  At that time she was having fever and COVID-like symptoms and her daughter tested positive for COVID.  On 01/15/2022 she was given paxlovid which she is still taking.  She reports the COVID-like symptoms have resolved.  She is having no fever or symptoms concerning for acute chest.  She has no joint swelling concerning for septic arthritis or hemarthrosis.  Patient has received 2 mg of Dilaudid and 15 mg of Toradol but is still having pain.  We will give her a second round of pain medication.  I independently evaluated patient's labs and she has mildly elevated reticulocyte count, CMP with no acute findings, CBC with leukocytosis of 24,000 and hemoglobin of 11.  We will continue to treat pain and hopeful that patient will be able to be discharged home later today.         Final Clinical Impression(s) / ED Diagnoses Final diagnoses:  None     Rx / DC Orders ED Discharge Orders     None         Blanchie Dessert, MD 01/22/22 [phone]

## 2022-01-21 NOTE — ED Provider Triage Note (Signed)
Emergency Medicine Provider Triage Evaluation Note  Hannah Hawkins , a 36 y.o. female  was evaluated in triage.  Pt complains of a sickle cell crisis   Review of Systems  Positive: Arm numbness Negative: Fever or cough  Physical Exam  BP (!) 130/96 (BP Location: Left Arm)    Pulse 100    Temp (!) 97.4 F (36.3 C) (Oral)    Resp 18    Ht 5\' 3"  (1.6 m)    Wt 59 kg    SpO2 98%    BMI 23.03 kg/m  Gen:   Awake, no distress   Resp:  Normal effort  MSK:   Moves extremities without difficulty  Other:    Medical Decision Making  Medically screening exam initiated at 8:49 PM.  Appropriate orders placed.  Hannah Hawkins was informed that the remainder of the evaluation will be completed by another provider, this initial triage assessment does not replace that evaluation, and the importance of remaining in the ED until their evaluation is complete.     [name], [place] Areas 01/21/22 2050

## 2022-01-22 MED ORDER — HYDROMORPHONE HCL 2 MG/ML IJ SOLN
2.0000 mg | Freq: Once | INTRAMUSCULAR | Status: AC
  Administered 2022-01-22: 2 mg via INTRAVENOUS
  Filled 2022-01-22: qty 1

## 2022-01-22 NOTE — Discharge Instructions (Signed)
You were evaluated in the Emergency Department and after careful evaluation, we did not find any emergent condition requiring admission or further testing in the hospital.  Your exam/testing today was overall reassuring.  Please return to the Emergency Department if you experience any worsening of your condition.  Thank you for allowing us to be a part of your care.  

## 2022-01-28 ENCOUNTER — Telehealth: Payer: Self-pay

## 2022-01-28 ENCOUNTER — Other Ambulatory Visit: Payer: Self-pay | Admitting: Nurse Practitioner

## 2022-01-28 DIAGNOSIS — G894 Chronic pain syndrome: Secondary | ICD-10-CM

## 2022-01-28 DIAGNOSIS — D5720 Sickle-cell/Hb-C disease without crisis: Secondary | ICD-10-CM

## 2022-01-28 MED ORDER — OXYCODONE HCL 15 MG PO TABS: 15.0000 mg | ORAL_TABLET | Freq: Four times a day (QID) | ORAL | 0 refills | Status: DC | PRN

## 2022-01-28 NOTE — Telephone Encounter (Signed)
Oxycodone  °

## 2022-01-28 NOTE — Telephone Encounter (Signed)
The refill has been sent. Thanks  ° °

## 2022-02-08 IMAGING — DX DG CHEST 2V
2 series · 2 of 2 positions shown · non-contrast
Comparison: January 04, 2020

CLINICAL DATA: Sickle cell pain.

EXAM:
CHEST - 2 VIEW

[chest lat]
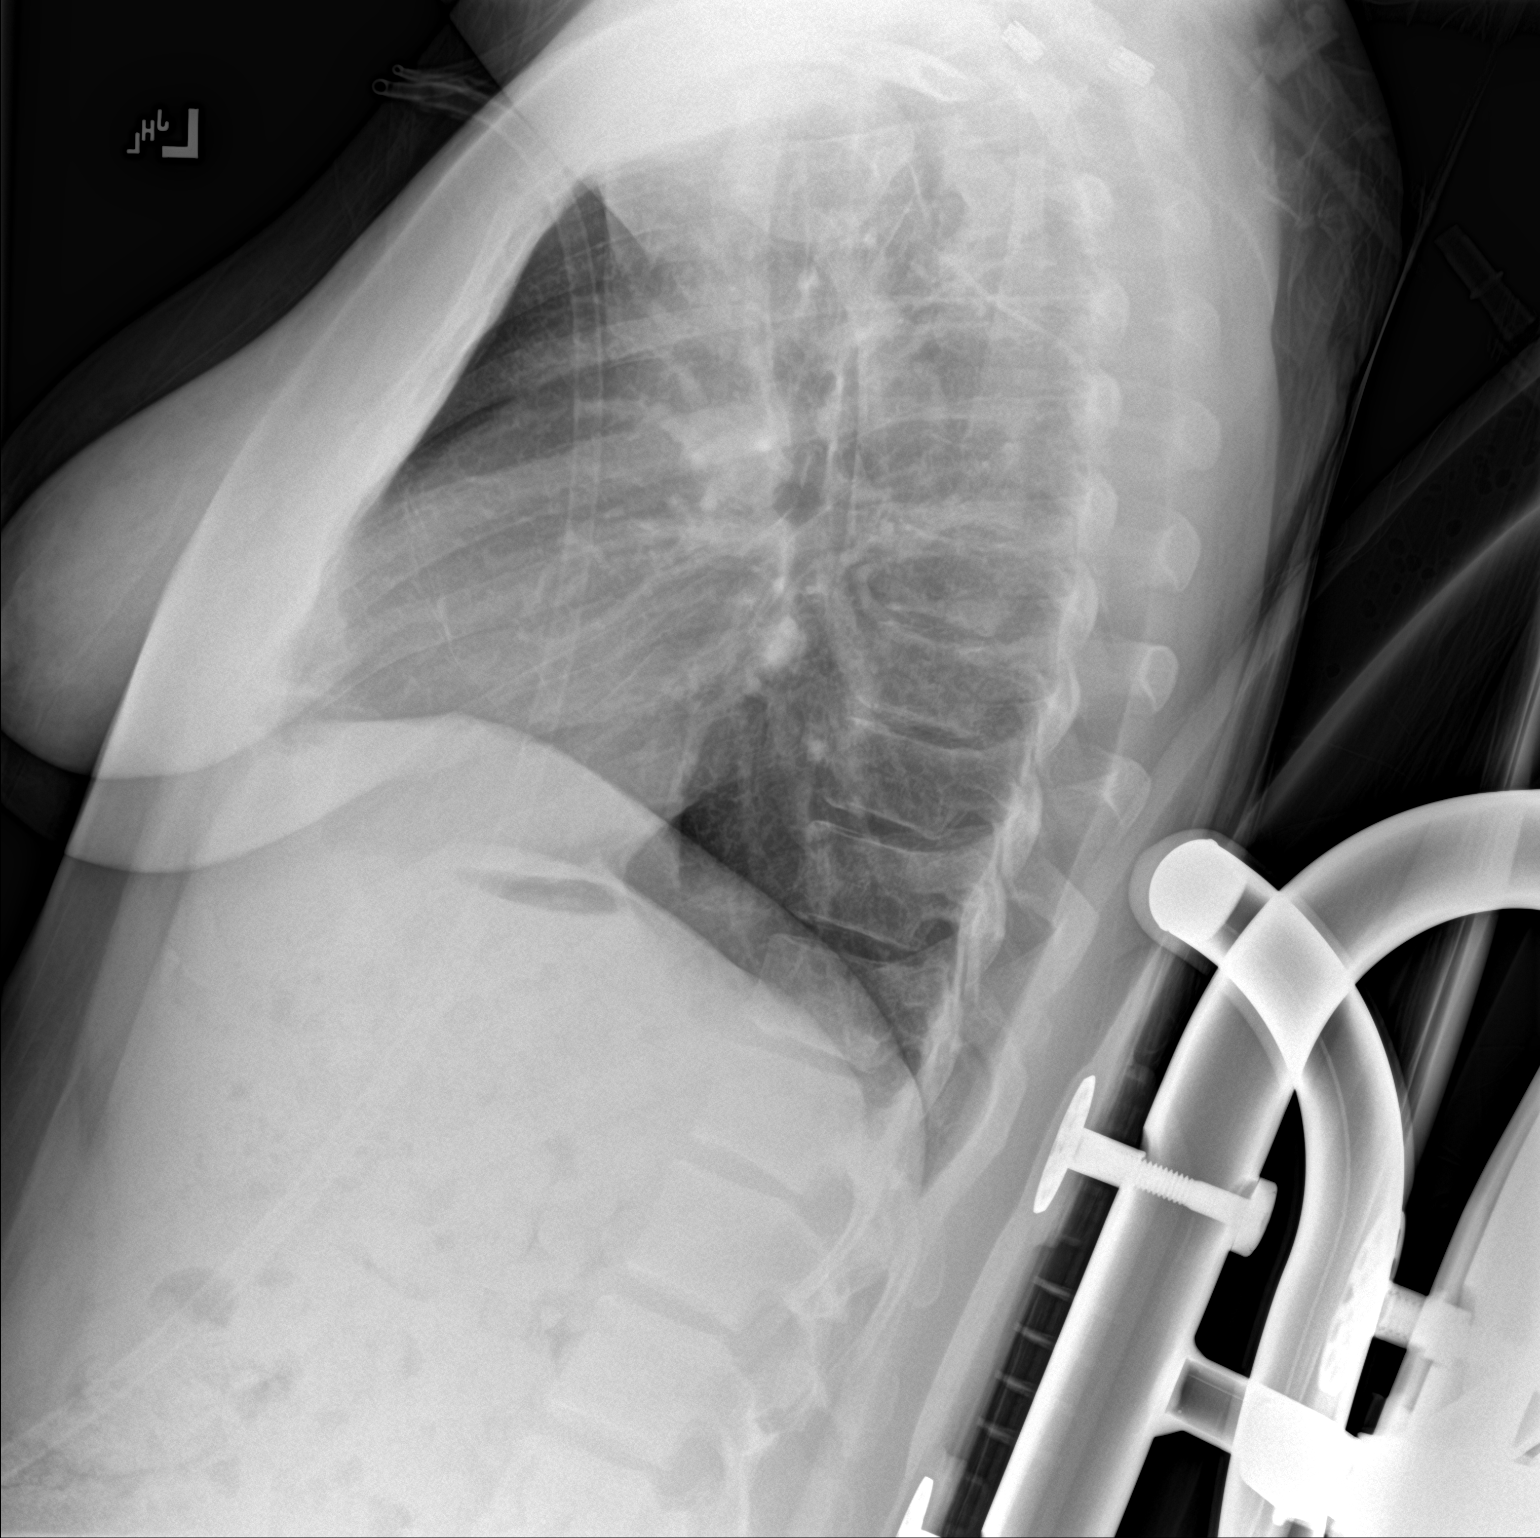

[chest ap]
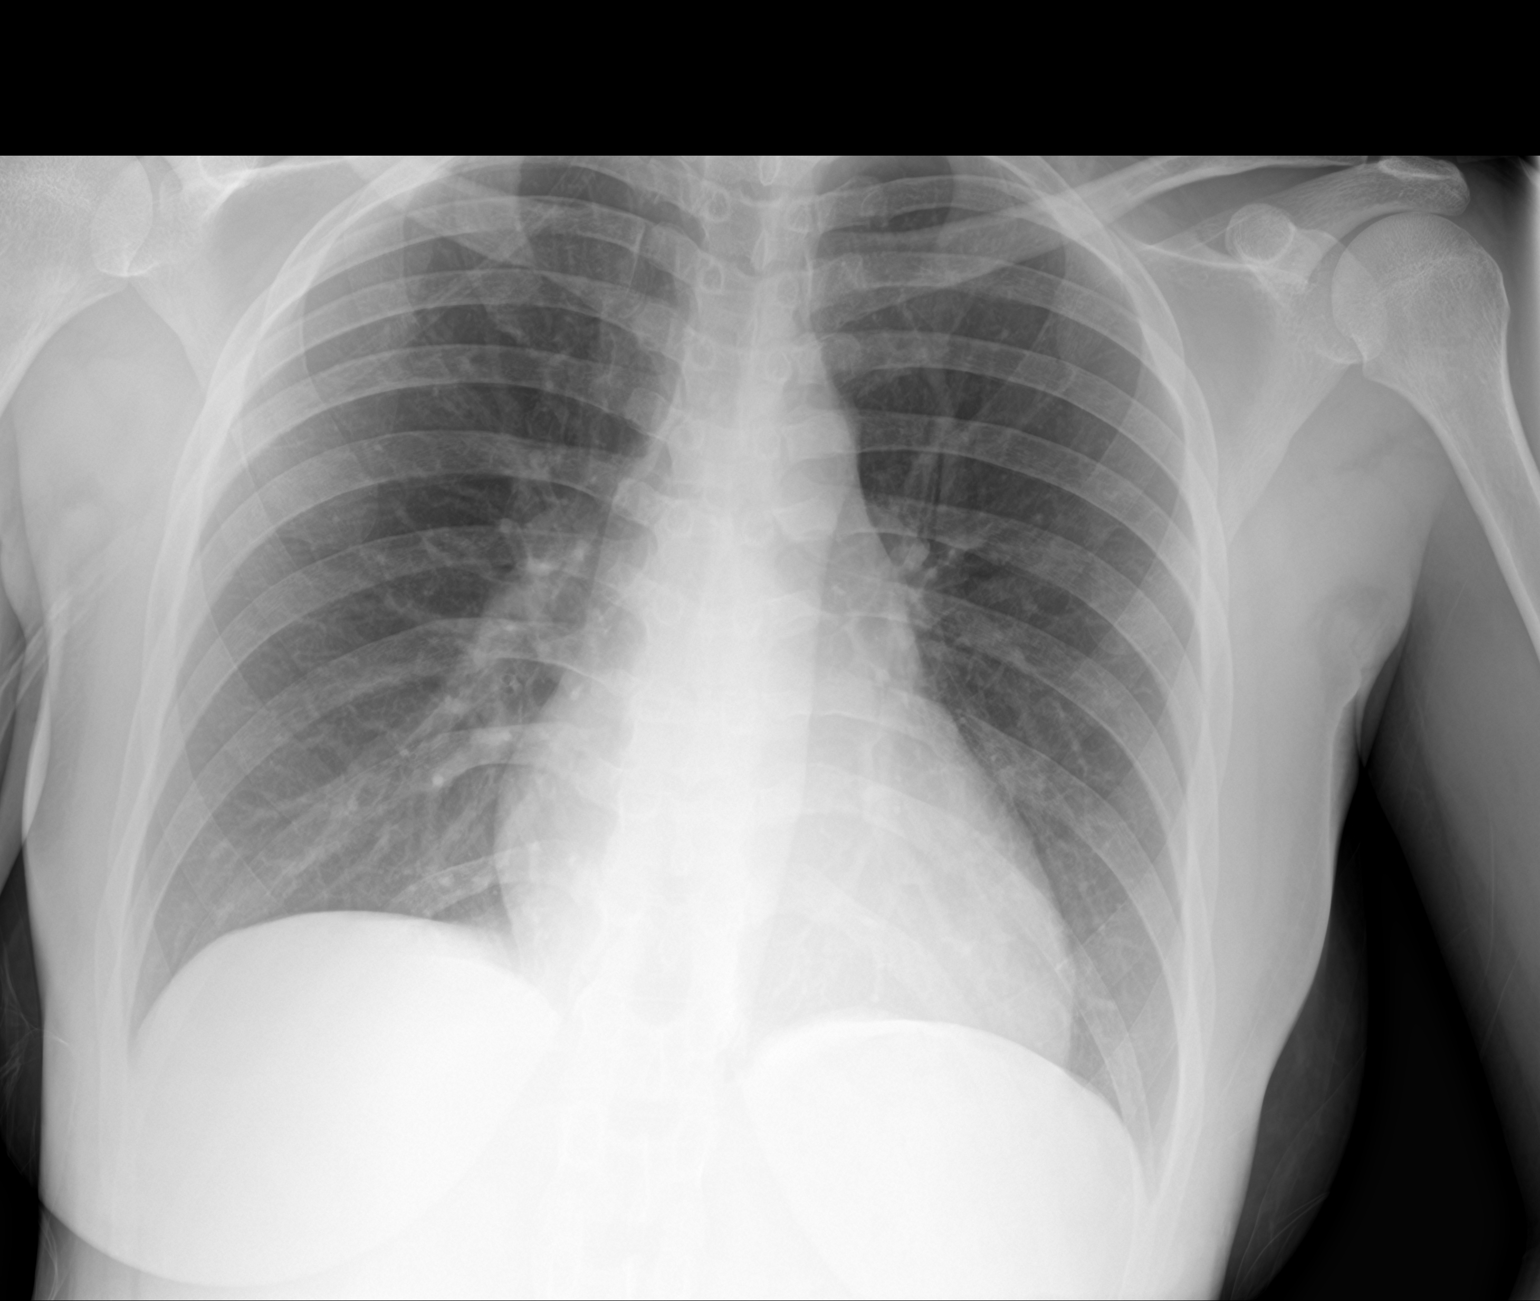

[2 of 2 positions shown; findings below may reference images not displayed]

FINDINGS: The heart size and mediastinal contours are within normal limits.
Both lungs are clear. The visualized skeletal structures are
unremarkable.
IMPRESSION: No active cardiopulmonary disease.

## 2022-02-11 ENCOUNTER — Telehealth: Payer: Self-pay

## 2022-02-11 ENCOUNTER — Other Ambulatory Visit: Payer: Self-pay | Admitting: Nurse Practitioner

## 2022-02-11 DIAGNOSIS — D5720 Sickle-cell/Hb-C disease without crisis: Secondary | ICD-10-CM

## 2022-02-11 DIAGNOSIS — G894 Chronic pain syndrome: Secondary | ICD-10-CM

## 2022-02-11 MED ORDER — OXYCODONE HCL 15 MG PO TABS: 15.0000 mg | ORAL_TABLET | Freq: Four times a day (QID) | ORAL | 0 refills | Status: DC | PRN

## 2022-02-11 NOTE — Telephone Encounter (Signed)
The refill has been sent. Thanks  ° °

## 2022-02-11 NOTE — Telephone Encounter (Signed)
Oxycodone  °

## 2022-02-18 ENCOUNTER — Ambulatory Visit: Payer: Medicaid Other | Admitting: Nurse Practitioner

## 2022-02-23 ENCOUNTER — Other Ambulatory Visit: Payer: Self-pay

## 2022-02-23 ENCOUNTER — Ambulatory Visit (INDEPENDENT_AMBULATORY_CARE_PROVIDER_SITE_OTHER): Payer: Medicaid Other | Admitting: Nurse Practitioner

## 2022-02-23 ENCOUNTER — Encounter: Payer: Self-pay | Admitting: Nurse Practitioner

## 2022-02-23 VITALS — BP 117/75 | HR 89 | Temp 98.0°F | Ht 63.0 in | Wt 134.0 lb

## 2022-02-23 DIAGNOSIS — S61012A Laceration without foreign body of left thumb without damage to nail, initial encounter: Secondary | ICD-10-CM | POA: Diagnosis not present

## 2022-02-23 NOTE — Patient Instructions (Signed)
You were seen today in the [facility] for laceration to left thumb. Please continue wound care at home. Please follow up as needed. ?

## 2022-02-23 NOTE — Progress Notes (Signed)
[place],   [address] Phone:  [phone]   Fax:  [phone] Subjective:   Patient ID: Hannah Hawkins, female    DOB: Feb 12, 1986, 36 y.o.   MRN: [id number]  Chief Complaint  Patient presents with   OFFICE VISIT    Pt stated she cut her left thumb by accident while cooking    HPI Hannah Hawkins 36 y.o. female  has a past medical history of Sickle cell anemia ([place]). To the [facility] for cut on left thumb.  Patient states that she cut her thumb one week ago while cooking. She states that she cut her thumb with a butcher knife. Denies any pain , but states that she has some numbness to the affected area. States that she was recently admitted to the hospital for psych evaluation and they were applying dry dressing to the area. When she returned home yesterday, she began applying neosporin to the area. Came to the [facility] today due to concern for possible infection. States, " it looks like its getting infected." Denies any drainage from site. Denies any other concerns today.   Denies any fever. Denies any fatigue, chest pain, shortness of breath, HA or dizziness. Denies any blurred vision.  Past Medical History:  Diagnosis Date   Sickle cell anemia (HCC)     Past Surgical History:  Procedure Laterality Date   CHOLECYSTECTOMY N/A 06/03/2021   Procedure: LAPAROSCOPIC CHOLECYSTECTOMY;  Surgeon: Coralie Keens, MD;  Location: [facility];  Service: General;  Laterality: N/A;   EYE SURGERY  03/09/2017   left eye   EYE SURGERY Right 09/2019   right [place]     Family History  Problem Relation Age of Onset   Diabetes Father     Social History   Socioeconomic History   Marital status: Single    Spouse name: Not on file   Number of children: Not on file   Years of education: Not on file   Highest education level: Not on file  Occupational History   Not on file  Tobacco Use   Smoking status: Former    Types: Cigarettes    Smokeless tobacco: Never  Vaping Use   Vaping Use: Every day   Substances: Flavoring  Substance and Sexual Activity   Alcohol use: Not Currently   Drug use: Not Currently    Types: Marijuana   Sexual activity: Yes  Other Topics Concern   Not on file  Social History Narrative   Not on file   Social Determinants of Health   Financial Resource Strain: Not on file  Food Insecurity: Not on file  Transportation Needs: Not on file  Physical Activity: Not on file  Stress: Not on file  Social Connections: Not on file  Intimate Partner Violence: Not on file    Outpatient Medications Prior to Visit  Medication Sig Dispense Refill   hydrOXYzine (VISTARIL) 25 MG capsule Take by mouth.     ibuprofen (ADVIL) 800 MG tablet Take 800 mg by mouth daily as needed for moderate pain.     NARCAN 4 MG/0.1ML LIQD nasal spray kit Place 4 mg into the nose once as needed (overdose).  0   norethindrone (MICRONOR) 0.35 MG tablet Take 1 tablet (0.35 mg total) by mouth daily. 28 tablet 11   ondansetron (ZOFRAN) 8 MG tablet Take 8 mg by mouth every 8 (eight) hours as needed for nausea or vomiting.     oxyCODONE (ROXICODONE) 15 MG immediate release  tablet Take 1 tablet (15 mg total) by mouth every 6 (six) hours as needed for up to 15 days for pain (moderate to severe pain). 60 tablet 0   pantoprazole (PROTONIX) 40 MG tablet Take 1 tablet (40 mg total) by mouth daily. 30 tablet 11   risperiDONE (RISPERDAL) 1 MG tablet Take by mouth.     risperiDONE (RISPERDAL) 2 MG tablet Take by mouth.     tiZANidine (ZANAFLEX) 2 MG tablet Take 1 tablet (2 mg total) by mouth every 6 (six) hours as needed for muscle spasms. 45 tablet 2   benzonatate (TESSALON) 200 MG capsule Take 1 capsule (200 mg total) by mouth 2 (two) times daily as needed for cough. (Patient not taking: Reported on 01/21/2022) 20 capsule 0   fluticasone (FLONASE) 50 MCG/ACT nasal spray Place 2 sprays into both nostrils daily. (Patient not taking: Reported on  01/21/2022) 16 g 2   lidocaine (LIDODERM) 5 % Place 1 patch onto the skin daily. Remove & Discard patch within 12 hours or as directed by MD (Patient not taking: Reported on 02/23/2022) 30 patch 2   lidocaine (XYLOCAINE) 2 % solution Use as directed 15 mLs in the mouth or throat every 3 (three) hours as needed for mouth pain (throat pain). (Patient not taking: Reported on 01/21/2022) 100 mL 0   mirtazapine (REMERON) 15 MG tablet Take 1 tablet (15 mg total) by mouth at bedtime. (Patient not taking: Reported on 01/21/2022) 30 tablet 11   montelukast (SINGULAIR) 10 MG tablet Take 1 tablet (10 mg total) by mouth at bedtime. (Patient not taking: Reported on 01/21/2022) 30 tablet 3   oseltamivir (TAMIFLU) 75 MG capsule Take 1 capsule (75 mg total) by mouth every 12 (twelve) hours. (Patient not taking: Reported on 01/15/2022) 10 capsule 0   No facility-administered medications prior to visit.    Allergies  Allergen Reactions   Penicillins Swelling and Rash    Has patient had a PCN reaction causing immediate rash, facial/tongue/throat swelling, SOB or lightheadedness with hypotension: Yes Has patient had a PCN reaction causing severe rash involving mucus membranes or skin necrosis: Yes Has patient had a PCN reaction that required hospitalization No Has patient had a PCN reaction occurring within the last 10 years: no If all of the above answers are "NO", then may proceed with Cephalosporin use.     Review of Systems  Constitutional: Negative.  Negative for chills, fever and malaise/fatigue.  Respiratory:  Negative for cough and shortness of breath.   Cardiovascular:  Negative for chest pain, palpitations and leg swelling.  Gastrointestinal:  Negative for abdominal pain, blood in stool, constipation, diarrhea, nausea and vomiting.  Skin:        See HPI  Neurological:  Positive for sensory change. Negative for dizziness, tingling, tremors, speech change, focal weakness, seizures, loss of consciousness,  weakness and headaches.  Psychiatric/Behavioral:  Negative for depression. The patient is not nervous/anxious.   All other systems reviewed and are negative.     Objective:    Physical Exam Constitutional:      General: She is not in acute distress.    Appearance: Normal appearance. She is normal weight.  HENT:     Head: Normocephalic.  Cardiovascular:     Rate and Rhythm: Normal rate and regular rhythm.     Pulses: Normal pulses.     Heart sounds: Normal heart sounds.     Comments: No obvious peripheral edema Pulmonary:     Effort: Pulmonary effort is normal.  Breath sounds: Normal breath sounds.  Musculoskeletal:        General: Signs of injury present. No swelling, tenderness or deformity. Normal range of motion.     Right lower leg: No edema.     Left lower leg: No edema.  Skin:    General: Skin is warm and dry.     Capillary Refill: Capillary refill takes less than 2 seconds.     Comments: 1-1.5 cm laceration noted over the left CMC joint of thumb. Appears to be healing well, no drainage noted. No swelling noted. Area is non tender to palpation. No erythema noted. FROM of effected digit intact.   Neurological:     General: No focal deficit present.     Mental Status: She is alert and oriented to person, place, and time.  Psychiatric:        Mood and Affect: Mood normal.        Behavior: Behavior normal.        Thought Content: Thought content normal.        Judgment: Judgment normal.    BP 117/75    Pulse 89    Temp 98 F (36.7 C)    Ht '5\' 3"'  (1.6 m)    Wt 134 lb (60.8 kg)    SpO2 100%    BMI 23.74 kg/m  Wt Readings from Last 3 Encounters:  02/23/22 134 lb (60.8 kg)  01/21/22 130 lb (59 kg)  01/15/22 129 lb 3.2 oz (58.6 kg)    Immunization History  Administered Date(s) Administered   Pneumococcal Polysaccharide-23 02/18/2017   Tdap 02/18/2017    Diabetic Foot Exam - Simple   No data filed     Lab Results  Component Value Date   TSH 1.940 07/24/2020    Lab Results  Component Value Date   WBC 24.0 (H) 01/21/2022   HGB 11.4 (L) 01/21/2022   HCT 31.0 (L) 01/21/2022   MCV 75.6 (L) 01/21/2022   PLT 341 01/21/2022   Lab Results  Component Value Date   NA 136 01/21/2022   K 4.0 01/21/2022   CO2 30 01/21/2022   GLUCOSE 139 (H) 01/21/2022   BUN 9 01/21/2022   CREATININE 0.85 01/21/2022   BILITOT 1.0 01/21/2022   ALKPHOS 63 01/21/2022   AST 39 01/21/2022   ALT 27 01/21/2022   PROT 7.4 01/21/2022   ALBUMIN 4.1 01/21/2022   CALCIUM 8.7 (L) 01/21/2022   ANIONGAP 7 01/21/2022   Lab Results  Component Value Date   CHOL 113 08/07/2020   Lab Results  Component Value Date   HDL 38 (L) 08/07/2020   Lab Results  Component Value Date   LDLCALC 65 08/07/2020   Lab Results  Component Value Date   TRIG 52 08/07/2020   Lab Results  Component Value Date   CHOLHDL 3.0 08/07/2020   No results found for: HGBA1C     Assessment & Plan:   Problem List Items Addressed This Visit   None Visit Diagnoses     Laceration of left thumb without foreign body without damage to nail, initial encounter    -  Primary PE reassuring for no infection at this time Patient UTD on Tdap  Due to length of time since injury, laceration repair is not recommended Discussed appropriate wound care with patient  Given anticipatory guidance    Follow up in 1-2 wks if symptoms worsen or do not improve, sooner as needed     I am having [name] maintain  her Narcan, tiZANidine, lidocaine, fluticasone, montelukast, mirtazapine, pantoprazole, norethindrone, lidocaine, benzonatate, oseltamivir, ibuprofen, ondansetron, oxyCODONE, hydrOXYzine, risperiDONE, and risperiDONE.  No orders of the defined types were placed in this encounter.    Teena Dunk, NP

## 2022-02-25 ENCOUNTER — Emergency Department (HOSPITAL_COMMUNITY)
Admission: EM | Admit: 2022-02-25 | Discharge: 2022-02-25 | Disposition: A | Discharge status: Home / Self Care | Payer: Medicaid Other | Attending: Emergency Medicine | Admitting: Emergency Medicine

## 2022-02-25 ENCOUNTER — Emergency Department (HOSPITAL_COMMUNITY)
Admission: EM | Admit: 2022-02-25 | Discharge: 2022-02-26 | Disposition: A | Discharge status: Home / Self Care | Payer: Medicaid Other | Source: Home / Self Care | Attending: Emergency Medicine | Admitting: Emergency Medicine

## 2022-02-25 ENCOUNTER — Encounter (HOSPITAL_COMMUNITY): Payer: Self-pay | Admitting: Emergency Medicine

## 2022-02-25 ENCOUNTER — Other Ambulatory Visit: Payer: Self-pay

## 2022-02-25 ENCOUNTER — Emergency Department (HOSPITAL_COMMUNITY): Payer: Medicaid Other

## 2022-02-25 ENCOUNTER — Encounter (HOSPITAL_COMMUNITY): Payer: Self-pay

## 2022-02-25 DIAGNOSIS — Z79899 Other long term (current) drug therapy: Secondary | ICD-10-CM | POA: Insufficient documentation

## 2022-02-25 DIAGNOSIS — M79662 Pain in left lower leg: Secondary | ICD-10-CM | POA: Insufficient documentation

## 2022-02-25 DIAGNOSIS — M79641 Pain in right hand: Secondary | ICD-10-CM | POA: Insufficient documentation

## 2022-02-25 DIAGNOSIS — M79661 Pain in right lower leg: Secondary | ICD-10-CM | POA: Insufficient documentation

## 2022-02-25 DIAGNOSIS — S0990XA Unspecified injury of head, initial encounter: Secondary | ICD-10-CM | POA: Insufficient documentation

## 2022-02-25 DIAGNOSIS — W01198A Fall on same level from slipping, tripping and stumbling with subsequent striking against other object, initial encounter: Secondary | ICD-10-CM | POA: Insufficient documentation

## 2022-02-25 DIAGNOSIS — W19XXXA Unspecified fall, initial encounter: Secondary | ICD-10-CM

## 2022-02-25 DIAGNOSIS — M79605 Pain in left leg: Secondary | ICD-10-CM

## 2022-02-25 DIAGNOSIS — G894 Chronic pain syndrome: Secondary | ICD-10-CM

## 2022-02-25 DIAGNOSIS — M79604 Pain in right leg: Secondary | ICD-10-CM

## 2022-02-25 LAB — BASIC METABOLIC PANEL
Anion gap: 6 (ref 5–15)
BUN: 12 mg/dL (ref 6–20)
CO2: 26 mmol/L (ref 22–32)
Calcium: 8.9 mg/dL (ref 8.9–10.3)
Chloride: 107 mmol/L (ref 98–111)
Creatinine, Ser: 0.94 mg/dL (ref 0.44–1.00)
GFR, Estimated: >60 mL/min (ref >60)
Glucose, Bld: 111 mg/dL — ABNORMAL HIGH (ref 70–99)
Potassium: 3.8 mmol/L (ref 3.5–5.1)
Sodium: 139 mmol/L (ref 135–145)

## 2022-02-25 LAB — RAPID URINE DRUG SCREEN, HOSP PERFORMED
Amphetamines: NOT DETECTED
Barbiturates: NOT DETECTED
Benzodiazepines: NOT DETECTED
Cocaine: NOT DETECTED
Opiates: NOT DETECTED
Tetrahydrocannabinol: NOT DETECTED

## 2022-02-25 LAB — CBC WITH DIFFERENTIAL/PLATELET
Abs Immature Granulocytes: 0.02 10*3/uL (ref 0.00–0.07)
Basophils Absolute: 0.1 10*3/uL (ref 0.0–0.1)
Basophils Relative: 1 %
Eosinophils Absolute: 0.5 10*3/uL (ref 0.0–0.5)
Eosinophils Relative: 5 %
HCT: 31.8 % — ABNORMAL LOW (ref 36.0–46.0)
Hemoglobin: 11.6 g/dL — ABNORMAL LOW (ref 12.0–15.0)
Immature Granulocytes: 0 %
Lymphocytes Relative: 28 %
Lymphs Abs: 2.9 10*3/uL (ref 0.7–4.0)
MCH: 27.4 pg (ref 26.0–34.0)
MCHC: 36.5 g/dL — ABNORMAL HIGH (ref 30.0–36.0)
MCV: 75.2 fL — ABNORMAL LOW (ref 80.0–100.0)
Monocytes Absolute: 1.2 10*3/uL — ABNORMAL HIGH (ref 0.1–1.0)
Monocytes Relative: 11 %
Neutro Abs: 5.6 10*3/uL (ref 1.7–7.7)
Neutrophils Relative %: 55 %
Platelets: 427 10*3/uL — ABNORMAL HIGH (ref 150–400)
RBC: 4.23 MIL/uL (ref 3.87–5.11)
RDW: 13.6 % (ref 11.5–15.5)
WBC: 10.3 10*3/uL (ref 4.0–10.5)
nRBC: 1 % — ABNORMAL HIGH (ref 0.0–0.2)

## 2022-02-25 LAB — RETICULOCYTES
Immature Retic Fract: 20 % — ABNORMAL HIGH (ref 2.3–15.9)
RBC.: 4.22 MIL/uL (ref 3.87–5.11)
Retic Count, Absolute: 115.2 10*3/uL (ref 19.0–186.0)
Retic Ct Pct: 2.7 % (ref 0.4–3.1)

## 2022-02-25 MED ORDER — OXYCODONE HCL 5 MG PO TABS
15.0000 mg | ORAL_TABLET | Freq: Once | ORAL | Status: AC
  Administered 2022-02-25: 22:00:00 15 mg via ORAL
  Filled 2022-02-25: qty 3

## 2022-02-25 MED ORDER — HYDROCODONE-ACETAMINOPHEN 5-325 MG PO TABS
1.0000 | ORAL_TABLET | Freq: Once | ORAL | Status: DC
  Filled 2022-02-25: qty 1

## 2022-02-25 NOTE — ED Notes (Signed)
Went to give patient her hydrocodone and she said that the tylenol in the medicine triggers her sickle cell. Told her that I have her discharge papers and then she requested to see the MD.  When Dr Effie Shy went in to the room she was complaining of her legs hurting which started over the last few minutes. He said that he was going to change the med to oxycodone and for her to follow up at the clinic tomorrow. ?

## 2022-02-25 NOTE — ED Provider Notes (Signed)
10:55 PM-she presents back to the ED immediately after being discharged, complaining of "sickle cell crisis."  She states the pain is in her legs.  She had claimed sickle cell crisis, previously after offered her Norco for her headache.  She states that after taking the Norco her headache is resolved but she is now in crisis.  Explained to her that taking her usual pain medicine is the proper way to approach this condition at this time.  I offered her to check labs and see if there was a problem that would require treatment.  She was agreeable to that initially.  Case was discussed with the oncoming provider team. ?  ?Mancel Bale, MD ?02/25/22 2259 ? ?

## 2022-02-25 NOTE — ED Provider Notes (Addendum)
?[facility] DEPT ?Provider Note ? ? ?CSN: [id number] ?Arrival date & time: 02/25/22  1948 ? ?  ? ?History ? ?Chief Complaint  ?Patient presents with  ? Hand Pain  ? Fall  ? ? ?Hannah Hawkins is a 36 y.o. female. ? ?HPI ?Patient states she fell hit her head 7 times today.  She is not sure why.  She complains of pain in her head and her right hand.  She came here by private vehicle after her friend dropped her off.  She denies other recent illnesses or problems. ?  ? ?Home Medications ?Prior to Admission medications   ?Medication Sig Start Date End Date Taking? Authorizing Provider  ?benzonatate (TESSALON) 200 MG capsule Take 1 capsule (200 mg total) by mouth 2 (two) times daily as needed for cough. ?Patient not taking: Reported on 01/21/2022 11/24/21   Bo Merino I, NP  ?fluticasone (FLONASE) 50 MCG/ACT nasal spray Place 2 sprays into both nostrils daily. ?Patient not taking: Reported on 01/21/2022 08/14/21   Vevelyn Francois, NP  ?hydrOXYzine (VISTARIL) 25 MG capsule Take by mouth. 02/22/22 03/04/22  [provider]  ?ibuprofen (ADVIL) 800 MG tablet Take 800 mg by mouth daily as needed for moderate pain. 12/24/21   [provider]  ?lidocaine (LIDODERM) 5 % Place 1 patch onto the skin daily. Remove & Discard patch within 12 hours or as directed by MD ?Patient not taking: Reported on 02/23/2022 06/19/21   Vevelyn Francois, NP  ?lidocaine (XYLOCAINE) 2 % solution Use as directed 15 mLs in the mouth or throat every 3 (three) hours as needed for mouth pain (throat pain). ?Patient not taking: Reported on 01/21/2022 11/24/21   Bo Merino I, NP  ?mirtazapine (REMERON) 15 MG tablet Take 1 tablet (15 mg total) by mouth at bedtime. ?Patient not taking: Reported on 01/21/2022 09/07/21 09/07/22  Vevelyn Francois, NP  ?montelukast (SINGULAIR) 10 MG tablet Take 1 tablet (10 mg total) by mouth at bedtime. ?Patient not taking: Reported on 01/21/2022 08/14/21   Vevelyn Francois, NP  ?NARCAN 4 MG/0.1ML  LIQD nasal spray kit Place 4 mg into the nose once as needed (overdose). 12/22/17   [provider]  ?norethindrone (MICRONOR) 0.35 MG tablet Take 1 tablet (0.35 mg total) by mouth daily. 09/08/21   Vevelyn Francois, NP  ?ondansetron (ZOFRAN) 8 MG tablet Take 8 mg by mouth every 8 (eight) hours as needed for nausea or vomiting. 12/24/21   [provider]  ?oseltamivir (TAMIFLU) 75 MG capsule Take 1 capsule (75 mg total) by mouth every 12 (twelve) hours. ?Patient not taking: Reported on 01/15/2022 12/23/21   Horton, Barbette Hair, MD  ?oxyCODONE (ROXICODONE) 15 MG immediate release tablet Take 1 tablet (15 mg total) by mouth every 6 (six) hours as needed for up to 15 days for pain (moderate to severe pain). 02/14/22 03/01/22  Vevelyn Francois, NP  ?pantoprazole (PROTONIX) 40 MG tablet Take 1 tablet (40 mg total) by mouth daily. 09/07/21 09/07/22  Vevelyn Francois, NP  ?risperiDONE (RISPERDAL) 1 MG tablet Take by mouth. 02/23/22   [provider]  ?risperiDONE (RISPERDAL) 2 MG tablet Take by mouth. 02/22/22   [provider]  ?tiZANidine (ZANAFLEX) 2 MG tablet Take 1 tablet (2 mg total) by mouth every 6 (six) hours as needed for muscle spasms. 05/28/21   Vevelyn Francois, NP  ?   ? ?Allergies    ?Penicillins   ? ?Review of Systems   ?Review of  Systems ? ?Physical Exam ?Updated Vital Signs ?BP 114/72   Pulse 92   Temp 97.7 ?F (36.5 ?C) (Oral)   Resp 18   Ht '5\' 6"'  (1.676 m)   Wt 60.8 kg   SpO2 99%   BMI 21.63 kg/m?  ?Physical Exam ?Vitals and nursing note reviewed.  ?Constitutional:   ?   General: She is not in acute distress. ?   Appearance: She is well-developed. She is not ill-appearing, toxic-appearing or diaphoretic.  ?HENT:  ?   Head: Normocephalic and atraumatic.  ?   Comments: No visible injury to scalp, head or face ?   Right Ear: External ear normal.  ?   Left Ear: External ear normal.  ?Eyes:  ?   Conjunctiva/sclera: Conjunctivae normal.  ?   Pupils: Pupils are equal, round, and reactive to  light.  ?Neck:  ?   Trachea: Phonation normal.  ?Cardiovascular:  ?   Rate and Rhythm: Normal rate.  ?Pulmonary:  ?   Effort: Pulmonary effort is normal.  ?Abdominal:  ?   Tenderness: There is no abdominal tenderness.  ?Musculoskeletal:     ?   General: Normal range of motion.  ?   Cervical back: Normal range of motion and neck supple.  ?   Comments: No swelling, bruising or deformity of the right hand.  Normal range of motion arms and legs bilaterally.  ?Skin: ?   General: Skin is warm and dry.  ?Neurological:  ?   Mental Status: She is alert and oriented to person, place, and time.  ?   Cranial Nerves: No cranial nerve deficit.  ?   Sensory: No sensory deficit.  ?   Motor: No abnormal muscle tone.  ?   Coordination: Coordination normal.  ?Psychiatric:     ?   Mood and Affect: Mood normal.     ?   Behavior: Behavior normal.     ?   Thought Content: Thought content normal.     ?   Judgment: Judgment normal.  ? ? ?ED Results / Procedures / Treatments   ?Labs ?(all labs ordered are listed, but only abnormal results are displayed) ?Labs Reviewed - No data to display ? ?EKG ?None ? ?Radiology ?CT Head Wo Contrast ? ?Result Date: 02/25/2022 ?CLINICAL DATA:  Syncope.  Head trauma with posterior headache. EXAM: CT HEAD WITHOUT CONTRAST TECHNIQUE: Contiguous axial images were obtained from the base of the skull through the vertex without intravenous contrast. RADIATION DOSE REDUCTION: This exam was performed according to the departmental dose-optimization program which includes automated exposure control, adjustment of the mA and/or kV according to patient size and/or use of iterative reconstruction technique. COMPARISON:  09/09/2019 FINDINGS: Brain: The brainstem, cerebellum, cerebral peduncles, thalami, basal ganglia, basilar cisterns, and ventricular system appear within normal limits. No intracranial hemorrhage, mass lesion, or acute CVA. Vascular: Unremarkable Skull: Unremarkable Sinuses/Orbits: Unremarkable Other: No  supplemental non-categorized findings. IMPRESSION: 1. No significant intracranial abnormality is identified. Electronically Signed   By: Van Clines M.D.   On: 02/25/2022 20:59   ? ?Procedures ?Procedures  ? ? ?Medications Ordered in ED ?Medications  ?HYDROcodone-acetaminophen (NORCO/VICODIN) 5-325 MG per tablet 1 tablet (has no administration in time range)  ? ? ?ED Course/ Medical Decision Making/ A&P ?  ?                        ?Medical Decision Making ?Patient with reported fall and head injury and right hand pain presenting without  signs of trauma. ? ?Amount and/or Complexity of Data Reviewed ?Independent Historian:  ?   Details: She is a cogent historian ?External Data Reviewed: notes. ?   Details: PDMP data-morphine milligram equivalent daily dose 90.  Last prescription 02/14/2022, oxycodone 15 mg #60. ?Radiology: ordered and independent interpretation performed. ?   Details: CT head-no acute abnormalities, bleeding or mass. ? ?Risk ?Prescription drug management. ?Decision regarding hospitalization. ?Risk Details: Patient with reported head injury, presenting without visible signs of injury.  She reports numerous falls injuring her head.  She has a completely normal neurologic exam.  No clinical evidence for fracture, CNS abnormality or spine injury.  No indication for hospitalization or further ED evaluation at this time.  She has chronic pain with a very high MME daily dosing narcotics. ? ? ? ? ? ? ? ? ? ? ?Final Clinical Impression(s) / ED Diagnoses ?Final diagnoses:  ?Fall, initial encounter  ?Injury of head, initial encounter  ? ? ?Rx / DC Orders ?ED Discharge Orders   ? ? None  ? ?  ? ? ?  ?Daleen Bo, MD ?02/25/22 2151 ? ?10:10 PM-after discharge, the patient refused to take Norco that was offered to her.  She now states "I am in sickle cell crisis with leg pain."  She states that it just started.  She states that her sickle cell crisis, "comes and goes."  She will be offered a dose of her  usual medicine, oxycodone 15 mg.  I instructed her that this was the proper thing to do when she is having pain.  She agreed to take that. ? ?  ?Daleen Bo, MD ?02/25/22 2215 ? ?

## 2022-02-25 NOTE — ED Triage Notes (Signed)
Pt reports that she is having a sickle cell crisis that started while she was being discharged from the other room minutes ago. States it is in her lower legs and ankles.  ?

## 2022-02-25 NOTE — ED Triage Notes (Signed)
Pt said that she got really hot this evening and passed out, she now has pain in the rt hand and a headache in the back of her head. ?

## 2022-02-25 NOTE — ED Provider Notes (Signed)
Hannah Hawkins DEPT Provider Note   CSN: [id number] Arrival date & time: 02/25/22  2242     History  Chief Complaint  Patient presents with   Sickle Cell Anemia    Hannah Hawkins is a 36 y.o. female.  Patient with history of sickle cell disease presents to the emergency department complaining of bilateral lower extremity pain.  Patient was seen in the emergency department this evening for reported falls.  She had a head CT that was negative.  She was given a dose of oxycodone 15 mg which is her typical home pain regimen.  She takes this every 6 hours.  Patient's symptoms in her lower extremities started while she was in the emergency department and after discharge, check back into the emergency department.  She denies chest pain, shortness of breath, cough, fever.  No other treatments prior to arrival.      Home Medications Prior to Admission medications   Medication Sig Start Date End Date Taking? Authorizing Provider  benzonatate (TESSALON) 200 MG capsule Take 1 capsule (200 mg total) by mouth 2 (two) times daily as needed for cough. Patient not taking: Reported on 01/21/2022 11/24/21   Bo Merino I, NP  fluticasone (FLONASE) 50 MCG/ACT nasal spray Place 2 sprays into both nostrils daily. Patient not taking: Reported on 01/21/2022 08/14/21   Vevelyn Francois, NP  hydrOXYzine (VISTARIL) 25 MG capsule Take by mouth. 02/22/22 03/04/22  [provider]  ibuprofen (ADVIL) 800 MG tablet Take 800 mg by mouth daily as needed for moderate pain. 12/24/21   [provider]  lidocaine (LIDODERM) 5 % Place 1 patch onto the skin daily. Remove & Discard patch within 12 hours or as directed by MD Patient not taking: Reported on 02/23/2022 06/19/21   Vevelyn Francois, NP  lidocaine (XYLOCAINE) 2 % solution Use as directed 15 mLs in the mouth or throat every 3 (three) hours as needed for mouth pain (throat pain). Patient not taking: Reported on 01/21/2022 11/24/21    Bo Merino I, NP  mirtazapine (REMERON) 15 MG tablet Take 1 tablet (15 mg total) by mouth at bedtime. Patient not taking: Reported on 01/21/2022 09/07/21 09/07/22  Vevelyn Francois, NP  montelukast (SINGULAIR) 10 MG tablet Take 1 tablet (10 mg total) by mouth at bedtime. Patient not taking: Reported on 01/21/2022 08/14/21   Vevelyn Francois, NP  [facility] 4 MG/0.1ML LIQD nasal spray kit Place 4 mg into the nose once as needed (overdose). 12/22/17   [provider]  norethindrone (MICRONOR) 0.35 MG tablet Take 1 tablet (0.35 mg total) by mouth daily. 09/08/21   Vevelyn Francois, NP  ondansetron (ZOFRAN) 8 MG tablet Take 8 mg by mouth every 8 (eight) hours as needed for nausea or vomiting. 12/24/21   [provider]  oseltamivir (TAMIFLU) 75 MG capsule Take 1 capsule (75 mg total) by mouth every 12 (twelve) hours. Patient not taking: Reported on 01/15/2022 12/23/21   Horton, Barbette Hair, MD  oxyCODONE (ROXICODONE) 15 MG immediate release tablet Take 1 tablet (15 mg total) by mouth every 6 (six) hours as needed for up to 15 days for pain (moderate to severe pain). 02/14/22 03/01/22  Vevelyn Francois, NP  pantoprazole (PROTONIX) 40 MG tablet Take 1 tablet (40 mg total) by mouth daily. 09/07/21 09/07/22  Vevelyn Francois, NP  risperiDONE (RISPERDAL) 1 MG tablet Take by mouth. 02/23/22   [provider]  risperiDONE (RISPERDAL) 2 MG tablet Take by mouth. 02/22/22  [provider]  tiZANidine (ZANAFLEX) 2 MG tablet Take 1 tablet (2 mg total) by mouth every 6 (six) hours as needed for muscle spasms. 05/28/21   Vevelyn Francois, NP      Allergies    Penicillins    Review of Systems   Review of Systems  Physical Exam Updated Vital Signs BP 122/88    Pulse 84    Temp 98 F (36.7 C)    Resp 15    SpO2 98%  Physical Exam Vitals and nursing note reviewed.  Constitutional:      General: She is not in acute distress.    Appearance: She is well-developed.  HENT:     Head: Normocephalic and  atraumatic.     Right Ear: External ear normal.     Left Ear: External ear normal.     Nose: Nose normal.  Eyes:     Conjunctiva/sclera: Conjunctivae normal.  Cardiovascular:     Rate and Rhythm: Normal rate and regular rhythm.     Heart sounds: No murmur heard. Pulmonary:     Effort: No respiratory distress.     Breath sounds: No wheezing, rhonchi or rales.     Comments: Lungs clear to auscultation bilaterally.  Patient in no respiratory distress. Abdominal:     Palpations: Abdomen is soft.     Tenderness: There is no abdominal tenderness. There is no guarding or rebound.  Musculoskeletal:     Cervical back: Normal range of motion and neck supple.     Right lower leg: No edema.     Left lower leg: No edema.     Comments: Patient voices tenderness to palpation in the bilateral ankle area without swelling, bruising, skin changes or ecchymosis.  Full passive range of motion of joints.  No signs of joint swelling in the ankles or knees.  Skin:    General: Skin is warm and dry.     Findings: No rash.  Neurological:     General: No focal deficit present.     Mental Status: She is alert. Mental status is at baseline.     Motor: No weakness.  Psychiatric:        Mood and Affect: Mood normal.    ED Results / Procedures / Treatments   Labs (all labs ordered are listed, but only abnormal results are displayed) Labs Reviewed  BASIC METABOLIC PANEL - Abnormal; Notable for the following components:      Result Value   Glucose, Bld 111 (*)    All other components within normal limits  CBC WITH DIFFERENTIAL/PLATELET - Abnormal; Notable for the following components:   Hemoglobin 11.6 (*)    HCT 31.8 (*)    MCV 75.2 (*)    MCHC 36.5 (*)    Platelets 427 (*)    nRBC 1.0 (*)    Monocytes Absolute 1.2 (*)    All other components within normal limits  RETICULOCYTES - Abnormal; Notable for the following components:   Immature Retic Fract 20.0 (*)    All other components within normal  limits  RAPID URINE DRUG SCREEN, HOSP PERFORMED    EKG None  Radiology CT Head Wo Contrast  Result Date: 02/25/2022 CLINICAL DATA:  Syncope.  Head trauma with posterior headache. EXAM: CT HEAD WITHOUT CONTRAST TECHNIQUE: Contiguous axial images were obtained from the base of the skull through the vertex without intravenous contrast. RADIATION DOSE REDUCTION: This exam was performed according to the departmental dose-optimization program which includes automated exposure control,  adjustment of the mA and/or kV according to patient size and/or use of iterative reconstruction technique. COMPARISON:  09/09/2019 FINDINGS: Brain: The brainstem, cerebellum, cerebral peduncles, thalami, basal ganglia, basilar cisterns, and ventricular system appear within normal limits. No intracranial hemorrhage, mass lesion, or acute CVA. Vascular: Unremarkable Skull: Unremarkable Sinuses/Orbits: Unremarkable Other: No supplemental non-categorized findings. IMPRESSION: 1. No significant intracranial abnormality is identified. Electronically Signed   By: Van Clines M.D.   On: 02/25/2022 20:59    Procedures Procedures    Medications Ordered in ED Medications - No data to display  ED Course/ Medical Decision Making/ A&P    Patient seen and examined. History obtained directly from patient.   Labs/EKG: Ordered CBC, BMP, reticulocytes, UDS.   Imaging: Considered x-ray of the lower extremities however no concerning findings or trauma reported.   Medications/Fluids: Oxycodone given earlier.  Do not feel that patient requires parenteral narcotics at this time.  Most recent vital signs reviewed and are as follows: BP 122/88    Pulse 84    Temp 98 F (36.7 C)    Resp 15    SpO2 98%   Initial impression: Lower extremity pain.    12:23 AM Reassessment performed. Patient appears stable, comfortable.  Labs personally reviewed and interpreted including: UDS negative, BMP unremarkable; CBC with stable  hemoglobin; reticulocyte count unremarkable.  Most current vital signs reviewed and are as follows: BP 121/84    Pulse 75    Temp 98 F (36.7 C)    Resp 14    SpO2 99%   Plan: Discharge to home.  Patient is adamantly requesting a dose of Dilaudid due to ongoing pain.  She states that if she does not get this, she would like to wait until the next shift to talk to another provider.  We will give 1 dose of IV Dilaudid 2 mg prior to discharge.  Patient agrees to be discharged and take her home medications as prescribed.   Prescriptions: None  ED return instructions discussed: Chest pain, shortness of breath, fever, uncontrolled pain if failing home regimen.  Follow-up instructions discussed: Patient encouraged to follow-up with their PCP in 2-3 days.  Encouraged patient to call sickle cell clinic in morning if not improved.                           Medical Decision Making Risk Prescription drug management.   Patient here earlier for falls and hitting head.  Work-up was reassuring.  She then developed pain in her legs and check back in after being discharged.  She has not continued home medications to control her current symptoms.  Vital signs are reassuring.  No chest pain or signs of acute chest syndrome.  Pain treated in ED.  Patient encouraged to use home regimen and follow-up as needed.   The patient's vital signs, pertinent lab work and imaging were reviewed and interpreted as discussed in the ED course. Hospitalization was considered for further testing, treatments, or serial exams/observation. However as patient is well-appearing, has a stable exam, and reassuring studies today, I do not feel that they warrant admission at this time. This plan was discussed with the patient who verbalizes agreement and comfort with this plan and seems reliable and able to return to the Emergency Department with worsening or changing symptoms.           Final Clinical Impression(s) / ED  Diagnoses Final diagnoses:  Pain in both lower extremities  Rx / DC Orders ED Discharge Orders     None         Carlisle Cater, Hershal Coria 02/26/22 0026    Quintella Reichert, MD 02/26/22 [phone]

## 2022-02-26 MED ORDER — ONDANSETRON HCL 4 MG/2ML IJ SOLN
4.0000 mg | Freq: Once | INTRAMUSCULAR | Status: AC
  Administered 2022-02-26: 4 mg via INTRAVENOUS
  Filled 2022-02-26: qty 2

## 2022-02-26 MED ORDER — HYDROMORPHONE HCL 2 MG/ML IJ SOLN
2.0000 mg | Freq: Once | INTRAMUSCULAR | Status: AC
  Administered 2022-02-26: 2 mg via INTRAVENOUS
  Filled 2022-02-26: qty 1

## 2022-02-26 NOTE — Discharge Instructions (Signed)
Your lab work appears to be at baseline.  Please take your home medications and follow-up with your doctors/sickle cell clinic in the morning if not feeling better. ?

## 2022-03-02 ENCOUNTER — Telehealth: Payer: Self-pay

## 2022-03-02 NOTE — Telephone Encounter (Signed)
Oxycodone  °

## 2022-03-03 ENCOUNTER — Other Ambulatory Visit: Payer: Self-pay | Admitting: Nurse Practitioner

## 2022-03-03 DIAGNOSIS — D5720 Sickle-cell/Hb-C disease without crisis: Secondary | ICD-10-CM

## 2022-03-03 DIAGNOSIS — G894 Chronic pain syndrome: Secondary | ICD-10-CM

## 2022-03-03 MED ORDER — OXYCODONE HCL 15 MG PO TABS: 15.0000 mg | ORAL_TABLET | Freq: Four times a day (QID) | ORAL | 0 refills | Status: DC | PRN

## 2022-03-03 NOTE — Telephone Encounter (Signed)
The refill has been sent. Thanks  ° °

## 2022-03-15 ENCOUNTER — Telehealth: Payer: Self-pay | Admitting: Clinical

## 2022-03-15 ENCOUNTER — Telehealth: Payer: Self-pay | Admitting: Nurse Practitioner

## 2022-03-15 ENCOUNTER — Telehealth: Payer: Self-pay

## 2022-03-15 NOTE — Telephone Encounter (Signed)
[facility] ?General Follow Up Note ? ?03/15/2022 ?Name: Hannah Hawkins MRN: [id number] DOB: 04-Feb-1986 ?Hannah Hawkins is a 36 y.o. year old female who sees Barbette Merino, NP for primary care. LCSW was initially consulted to assist with housing and community resources.  ? ?Interpreter: No.   Interpreter Name & Language: none ? ?Assessment: Patient experiencing housing barriers. She reported that she will need to move out of her current home soon.  ? ?Ongoing Intervention: Today patient called CSW and requested housing resources again. She lost touch with caseworker at [facility] and [facility]) due to getting a new phone with new number. CSW re-referred patient to [facility] social worker for assistance with housing and also discussed search option through [employer]. ? ?Patient also indicated she has not had follow up from [facility] regarding submitting a disability claim. This is likely due to the change in her phone number. She indicated someone else at another clinic or agency may have helped her submit a claim but not sure. Will place new referral to [facility] with patient's new contact information.  ? ?Review of patient status, including review of consultants reports, relevant laboratory and other test results, and collaboration with appropriate care team members and the patient's provider was performed as part of comprehensive patient evaluation and provision of services.   ? ?Abigail Butts, LCSW ?[facility] ?[facility] ?[phone] ?  ? ?

## 2022-03-15 NOTE — Telephone Encounter (Signed)
Oxycodone  °

## 2022-03-15 NOTE — Telephone Encounter (Signed)
Requesting Crystal to give her a call ?

## 2022-03-16 ENCOUNTER — Ambulatory Visit: Payer: Self-pay | Admitting: Nurse Practitioner

## 2022-03-17 ENCOUNTER — Encounter: Payer: Self-pay | Admitting: Nurse Practitioner

## 2022-03-17 ENCOUNTER — Ambulatory Visit (INDEPENDENT_AMBULATORY_CARE_PROVIDER_SITE_OTHER): Payer: Medicaid Other | Admitting: Nurse Practitioner

## 2022-03-17 VITALS — BP 110/77 | HR 70 | Temp 98.3°F | Ht 63.0 in | Wt 136.1 lb

## 2022-03-17 DIAGNOSIS — N949 Unspecified condition associated with female genital organs and menstrual cycle: Secondary | ICD-10-CM

## 2022-03-17 DIAGNOSIS — N390 Urinary tract infection, site not specified: Secondary | ICD-10-CM

## 2022-03-17 DIAGNOSIS — N912 Amenorrhea, unspecified: Secondary | ICD-10-CM | POA: Diagnosis not present

## 2022-03-17 DIAGNOSIS — R8290 Unspecified abnormal findings in urine: Secondary | ICD-10-CM

## 2022-03-17 LAB — POCT URINALYSIS DIP (CLINITEK)
Bilirubin, UA: NEGATIVE
Blood, UA: NEGATIVE
Glucose, UA: NEGATIVE mg/dL
Ketones, POC UA: NEGATIVE mg/dL
Nitrite, UA: POSITIVE — AB
POC PROTEIN,UA: NEGATIVE
Spec Grav, UA: 1.02 (ref 1.010–1.025)
Urobilinogen, UA: 1 E.U./dL
pH, UA: 6.5 (ref 5.0–8.0)

## 2022-03-17 LAB — POCT URINE PREGNANCY: Preg Test, Ur: NEGATIVE

## 2022-03-17 MED ORDER — NITROFURANTOIN MONOHYD MACRO 100 MG PO CAPS: 100.0000 mg | ORAL_CAPSULE | Freq: Two times a day (BID) | ORAL | 0 refills | Status: AC

## 2022-03-17 NOTE — Progress Notes (Signed)
? ?[facility] ?[place], [place]  [address] ?Phone:  [phone]   Fax:  [phone] ?Subjective:  ? Patient ID: Hannah Hawkins, female    DOB: 15-Dec-1986, 36 y.o.   MRN: [id number] ? ?Chief Complaint  ?Patient presents with  ? Follow-up  ?  Pt stated she missed her menstrual, vaginal tingly no discharger, nausea. Pt stated she has been feeling like this for week. Pt is requesting a refill on lidocaine patch and oxycodone  ? ?HPI ?Hannah Hawkins 36 y.o. female  has a past medical history of Sickle cell anemia ([employer]). To the [facility] for missed menstrual cycle and vaginal discomfort x 1 wk.  ? ?LMP February, states that it was heavier than normal and she suspects it may have been a miscarriage. States that she bled for 1.5 wks, and her cycle typically only lasts 4 days.  ?Denies any abnormal vaginal discharge. Has had two partners in the past 6 mths, unprotected. Has prescribed birth control, last dose December/ January. Took a home pregnancy test and it was negative. Has had nausea and vaginal discomfort, that she describes as indescribable. Denies any other concerns today.  ? ?Denies any fever. Denies any fatigue, chest pain, shortness of breath, HA or dizziness. Denies any blurred vision, numbness or tingling. ? ? ?Past Medical History:  ?Diagnosis Date  ? Sickle cell anemia (HCC)   ? ? ?Past Surgical History:  ?Procedure Laterality Date  ? CHOLECYSTECTOMY N/A 06/03/2021  ? Procedure: LAPAROSCOPIC CHOLECYSTECTOMY;  Surgeon: Coralie Keens, MD;  Location: [facility] ORS;  Service: General;  Laterality: N/A;  ? EYE SURGERY  03/09/2017  ? left eye  ? EYE SURGERY Right 09/2019  ? right hemerage   ? ? ?Family History  ?Problem Relation Age of Onset  ? Diabetes Father   ? ? ?Social History  ? ?Socioeconomic History  ? Marital status: Single  ?  Spouse name: Not on file  ? Number of children: Not on file  ? Years of education: Not on file  ? Highest education level: Not on file   ?Occupational History  ? Not on file  ?Tobacco Use  ? Smoking status: Former  ?  Types: Cigarettes  ? Smokeless tobacco: Never  ?Vaping Use  ? Vaping Use: Every day  ? Substances: Flavoring  ?Substance and Sexual Activity  ? Alcohol use: Not Currently  ? Drug use: Not Currently  ?  Types: Marijuana  ? Sexual activity: Yes  ?Other Topics Concern  ? Not on file  ?Social History Narrative  ? Not on file  ? ?Social Determinants of Health  ? ?Financial Resource Strain: Not on file  ?Food Insecurity: Not on file  ?Transportation Needs: Not on file  ?Physical Activity: Not on file  ?Stress: Not on file  ?Social Connections: Not on file  ?Intimate Partner Violence: Not on file  ? ? ?Outpatient Medications Prior to Visit  ?Medication Sig Dispense Refill  ? fluticasone (FLONASE) 50 MCG/ACT nasal spray Place 2 sprays into both nostrils daily. 16 g 2  ? ibuprofen (ADVIL) 800 MG tablet Take 800 mg by mouth daily as needed for moderate pain.    ? NARCAN 4 MG/0.1ML LIQD nasal spray kit Place 4 mg into the nose once as needed (overdose).  0  ? ondansetron (ZOFRAN) 8 MG tablet Take 8 mg by mouth every 8 (eight) hours as needed for nausea or vomiting.    ? risperiDONE (RISPERDAL) 2 MG tablet Take by mouth.    ?  oxyCODONE (ROXICODONE) 15 MG immediate release tablet Take 1 tablet (15 mg total) by mouth every 6 (six) hours as needed for up to 15 days for pain (moderate to severe pain). 60 tablet 0  ? lidocaine (LIDODERM) 5 % Place 1 patch onto the skin daily. Remove & Discard patch within 12 hours or as directed by MD (Patient not taking: Reported on 02/23/2022) 30 patch 2  ? mirtazapine (REMERON) 15 MG tablet Take 1 tablet (15 mg total) by mouth at bedtime. (Patient not taking: Reported on 01/21/2022) 30 tablet 11  ? montelukast (SINGULAIR) 10 MG tablet Take 1 tablet (10 mg total) by mouth at bedtime. (Patient not taking: Reported on 01/21/2022) 30 tablet 3  ? norethindrone (MICRONOR) 0.35 MG tablet Take 1 tablet (0.35 mg total) by mouth  daily. 28 tablet 11  ? oseltamivir (TAMIFLU) 75 MG capsule Take 1 capsule (75 mg total) by mouth every 12 (twelve) hours. (Patient not taking: Reported on 01/15/2022) 10 capsule 0  ? pantoprazole (PROTONIX) 40 MG tablet Take 1 tablet (40 mg total) by mouth daily. (Patient not taking: Reported on 03/17/2022) 30 tablet 11  ? risperiDONE (RISPERDAL) 1 MG tablet Take by mouth. (Patient not taking: Reported on 03/17/2022)    ? tiZANidine (ZANAFLEX) 2 MG tablet Take 1 tablet (2 mg total) by mouth every 6 (six) hours as needed for muscle spasms. (Patient not taking: Reported on 03/17/2022) 45 tablet 2  ? benzonatate (TESSALON) 200 MG capsule Take 1 capsule (200 mg total) by mouth 2 (two) times daily as needed for cough. (Patient not taking: Reported on 01/21/2022) 20 capsule 0  ? lidocaine (XYLOCAINE) 2 % solution Use as directed 15 mLs in the mouth or throat every 3 (three) hours as needed for mouth pain (throat pain). (Patient not taking: Reported on 01/21/2022) 100 mL 0  ? ?No facility-administered medications prior to visit.  ? ? ?Allergies  ?Allergen Reactions  ? Penicillins Swelling and Rash  ?  Has patient had a PCN reaction causing immediate rash, facial/tongue/throat swelling, SOB or lightheadedness with hypotension: Yes ?Has patient had a PCN reaction causing severe rash involving mucus membranes or skin necrosis: Yes ?Has patient had a PCN reaction that required hospitalization No ?Has patient had a PCN reaction occurring within the last 10 years: no ?If all of the above answers are "NO", then may proceed with Cephalosporin use. ?  ? ? ?Review of Systems  ?Constitutional:  Negative for chills, fever and malaise/fatigue.  ?Respiratory:  Negative for cough and shortness of breath.   ?Cardiovascular:  Negative for chest pain, palpitations and leg swelling.  ?Gastrointestinal:  Positive for nausea. Negative for abdominal pain, blood in stool, constipation, diarrhea and vomiting.  ?Genitourinary:  Negative for dysuria, flank  pain, frequency, hematuria and urgency.  ?     See HPI  ?Skin: Negative.   ?Neurological: Negative.   ?Psychiatric/Behavioral:  Negative for depression. The patient is not nervous/anxious.   ?All other systems reviewed and are negative. ? ?   ?Objective:  ?  ?Physical Exam ?Vitals reviewed.  ?Constitutional:   ?   General: She is not in acute distress. ?   Appearance: Normal appearance. She is normal weight.  ?HENT:  ?   Head: Normocephalic.  ?Neck:  ?   Vascular: No carotid bruit.  ?Cardiovascular:  ?   Rate and Rhythm: Normal rate and regular rhythm.  ?   Pulses: Normal pulses.  ?   Heart sounds: Normal heart sounds.  ?  Comments: No obvious peripheral edema ?Pulmonary:  ?   Effort: Pulmonary effort is normal.  ?   Breath sounds: Normal breath sounds.  ?Musculoskeletal:     ?   General: No swelling, tenderness, deformity or signs of injury. Normal range of motion.  ?   Cervical back: Normal range of motion and neck supple. No rigidity or tenderness.  ?   Right lower leg: No edema.  ?   Left lower leg: No edema.  ?Lymphadenopathy:  ?   Cervical: No cervical adenopathy.  ?Skin: ?   General: Skin is warm and dry.  ?   Capillary Refill: Capillary refill takes less than 2 seconds.  ?Neurological:  ?   General: No focal deficit present.  ?   Mental Status: She is alert and oriented to person, place, and time.  ?Psychiatric:     ?   Mood and Affect: Mood normal.     ?   Behavior: Behavior normal.     ?   Thought Content: Thought content normal.     ?   Judgment: Judgment normal.  ? ? ?BP 110/77 (BP Location: Left Arm, Patient Position: Sitting, Cuff Size: Normal)   Pulse 70   Temp 98.3 ?F (36.8 ?C)   Ht _0  (1.6 m)   Wt 136 lb 2 oz (61.7 kg)   SpO2 100%   BMI 24.11 kg/m?  ?Wt Readings from Last 3 Encounters:  ?03/17/22 136 lb 2 oz (61.7 kg)  ?02/25/22 134 lb (60.8 kg)  ?02/23/22 134 lb (60.8 kg)  ? ? ?Immunization History  ?Administered Date(s) Administered  ? Pneumococcal Polysaccharide-23 02/18/2017  ? Tdap  02/18/2017  ? ? ?Diabetic Foot Exam - Simple   ?No data filed ?  ? ? ?Lab Results  ?Component Value Date  ? TSH 1.940 07/24/2020  ? ?Lab Results  ?Component Value Date  ? WBC 10.3 02/25/2022  ? HGB 11.6 (L) 02/25/2022  ?

## 2022-03-17 NOTE — Telephone Encounter (Signed)
Patient cam into the office today because she had missed her last appt and wanted to schedule her follow appt. Patient will be seen tomorrow on 03/17/22 with Orion Crook, NP. ?

## 2022-03-17 NOTE — Patient Instructions (Signed)
You were seen today in the [facility] for amenorrhea and vaginal discomfort  . Labs were collected, results will be available via MyChart or, if abnormal, you will be contacted by clinic staff. You were prescribed medications, please take as directed. Please follow up in 3 mths for reevaluation.  ?

## 2022-03-18 ENCOUNTER — Other Ambulatory Visit: Payer: Self-pay | Admitting: Internal Medicine

## 2022-03-18 DIAGNOSIS — D5720 Sickle-cell/Hb-C disease without crisis: Secondary | ICD-10-CM

## 2022-03-18 DIAGNOSIS — G894 Chronic pain syndrome: Secondary | ICD-10-CM

## 2022-03-18 LAB — HUMAN CHORIONIC GONADOTROPIN(HCG),B-SUBUNIT,QUANTITATIVE): HCG, Beta Chain, Quant, S: <1 m[IU]/mL

## 2022-03-18 MED ORDER — OXYCODONE HCL 15 MG PO TABS: 15.0000 mg | ORAL_TABLET | Freq: Four times a day (QID) | ORAL | 0 refills | Status: DC | PRN

## 2022-03-19 ENCOUNTER — Other Ambulatory Visit: Payer: Self-pay | Admitting: Internal Medicine

## 2022-03-20 LAB — NUSWAB VAGINITIS PLUS (VG+)
Atopobium vaginae: HIGH Score — AB
BVAB 2: HIGH Score — AB
Candida albicans, NAA: NEGATIVE
Candida glabrata, NAA: NEGATIVE
Chlamydia trachomatis, NAA: NEGATIVE
Megasphaera 1: HIGH Score — AB
Neisseria gonorrhoeae, NAA: NEGATIVE
Trich vag by NAA: NEGATIVE

## 2022-03-22 ENCOUNTER — Other Ambulatory Visit: Payer: Self-pay | Admitting: Nurse Practitioner

## 2022-03-22 DIAGNOSIS — N760 Acute vaginitis: Secondary | ICD-10-CM

## 2022-03-22 MED ORDER — METRONIDAZOLE 500 MG PO TABS: 500.0000 mg | ORAL_TABLET | Freq: Two times a day (BID) | ORAL | 0 refills | Status: AC

## 2022-03-23 ENCOUNTER — Other Ambulatory Visit: Payer: Self-pay

## 2022-03-23 ENCOUNTER — Emergency Department (HOSPITAL_COMMUNITY): Payer: Medicaid Other

## 2022-03-23 ENCOUNTER — Other Ambulatory Visit: Payer: Self-pay | Admitting: Nurse Practitioner

## 2022-03-23 ENCOUNTER — Emergency Department (HOSPITAL_COMMUNITY)
Admission: EM | Admit: 2022-03-23 | Discharge: 2022-03-23 | Disposition: A | Discharge status: Home / Self Care | Payer: Medicaid Other | Attending: Emergency Medicine | Admitting: Emergency Medicine

## 2022-03-23 DIAGNOSIS — F1290 Cannabis use, unspecified, uncomplicated: Secondary | ICD-10-CM | POA: Insufficient documentation

## 2022-03-23 DIAGNOSIS — Z20822 Contact with and (suspected) exposure to covid-19: Secondary | ICD-10-CM | POA: Diagnosis not present

## 2022-03-23 DIAGNOSIS — G894 Chronic pain syndrome: Secondary | ICD-10-CM

## 2022-03-23 DIAGNOSIS — R0981 Nasal congestion: Secondary | ICD-10-CM | POA: Insufficient documentation

## 2022-03-23 DIAGNOSIS — R0789 Other chest pain: Secondary | ICD-10-CM | POA: Diagnosis present

## 2022-03-23 DIAGNOSIS — F419 Anxiety disorder, unspecified: Secondary | ICD-10-CM | POA: Insufficient documentation

## 2022-03-23 DIAGNOSIS — R0602 Shortness of breath: Secondary | ICD-10-CM | POA: Diagnosis not present

## 2022-03-23 LAB — COMPREHENSIVE METABOLIC PANEL
ALT: 13 U/L (ref 0–44)
AST: 21 U/L (ref 15–41)
Albumin: 4.6 g/dL (ref 3.5–5.0)
Alkaline Phosphatase: 51 U/L (ref 38–126)
Anion gap: 6 (ref 5–15)
BUN: 10 mg/dL (ref 6–20)
CO2: 28 mmol/L (ref 22–32)
Calcium: 9.4 mg/dL (ref 8.9–10.3)
Chloride: 103 mmol/L (ref 98–111)
Creatinine, Ser: 0.74 mg/dL (ref 0.44–1.00)
GFR, Estimated: >60 mL/min (ref >60)
Glucose, Bld: 118 mg/dL — ABNORMAL HIGH (ref 70–99)
Potassium: 4.1 mmol/L (ref 3.5–5.1)
Sodium: 137 mmol/L (ref 135–145)
Total Bilirubin: 0.9 mg/dL (ref 0.3–1.2)
Total Protein: 7.9 g/dL (ref 6.5–8.1)

## 2022-03-23 LAB — I-STAT BETA HCG BLOOD, ED (MC, WL, AP ONLY): I-stat hCG, quantitative: <5 m[IU]/mL (ref <5)

## 2022-03-23 LAB — CBC WITH DIFFERENTIAL/PLATELET
Abs Immature Granulocytes: 0.04 10*3/uL (ref 0.00–0.07)
Basophils Absolute: 0.1 10*3/uL (ref 0.0–0.1)
Basophils Relative: 1 %
Eosinophils Absolute: 0.4 10*3/uL (ref 0.0–0.5)
Eosinophils Relative: 4 %
HCT: 33.1 % — ABNORMAL LOW (ref 36.0–46.0)
Hemoglobin: 12.4 g/dL (ref 12.0–15.0)
Immature Granulocytes: 0 %
Lymphocytes Relative: 21 %
Lymphs Abs: 2.4 10*3/uL (ref 0.7–4.0)
MCH: 27.6 pg (ref 26.0–34.0)
MCHC: 37.5 g/dL — ABNORMAL HIGH (ref 30.0–36.0)
MCV: 73.7 fL — ABNORMAL LOW (ref 80.0–100.0)
Monocytes Absolute: 0.8 10*3/uL (ref 0.1–1.0)
Monocytes Relative: 7 %
Neutro Abs: 7.6 10*3/uL (ref 1.7–7.7)
Neutrophils Relative %: 67 %
Platelets: 381 10*3/uL (ref 150–400)
RBC: 4.49 MIL/uL (ref 3.87–5.11)
RDW: 13.1 % (ref 11.5–15.5)
WBC: 11.3 10*3/uL — ABNORMAL HIGH (ref 4.0–10.5)
nRBC: 1.1 % — ABNORMAL HIGH (ref 0.0–0.2)

## 2022-03-23 LAB — TROPONIN I (HIGH SENSITIVITY)
Troponin I (High Sensitivity): 9 ng/L (ref <18)
Troponin I (High Sensitivity): 8 ng/L (ref <18)

## 2022-03-23 LAB — RESP PANEL BY RT-PCR (FLU A&B, COVID) ARPGX2
Influenza A by PCR: NEGATIVE
Influenza B by PCR: NEGATIVE
SARS Coronavirus 2 by RT PCR: NEGATIVE

## 2022-03-23 MED ORDER — LIDOCAINE 4 % EX PTCH: 1.0000 | MEDICATED_PATCH | CUTANEOUS | 0 refills | Status: DC

## 2022-03-23 MED ORDER — LORAZEPAM 1 MG PO TABS
1.0000 mg | ORAL_TABLET | Freq: Once | ORAL | Status: AC
  Administered 2022-03-23: 1 mg via ORAL
  Filled 2022-03-23: qty 1

## 2022-03-23 NOTE — ED Notes (Addendum)
Upon checking BP cuff at the beginning of the shift, cuff found to be in incorrect position as well as incorrect size.  Correct size and placement obtained for BP cuff.  Pt then given warm blankets and found to be resting comfortably.  BP prior to changing cuff location and size reading in 140's/80's.  Once appropriate cuff in place, pt no longer shivering BP reading showed 90's/60's. ?

## 2022-03-23 NOTE — ED Triage Notes (Signed)
BIB EMS from home for anxiety , has hx and taking meds, no other complaints ?

## 2022-03-23 NOTE — Discharge Instructions (Signed)
You were seen here today for evaluation of your chest pain and SOB. Your lab work was normal. I am glad you are feeling better after the medication. Please follow up with your PCP.  ? ?Contact a health care provider if: ?Your chest pain does not go away. ?You feel depressed. ?You have a fever. ?You notice changes in your symptoms or develop new symptoms. ?Get help right away if: ?Your chest pain gets worse. ?You have a cough that gets worse, or you cough up blood. ?You have severe pain in your abdomen. ?You faint. ?You have sudden, unexplained chest discomfort. ?You have sudden, unexplained discomfort in your arms, back, neck, or jaw. ?You have shortness of breath at any time. ?You suddenly start to sweat, or your skin gets clammy. ?You feel nausea or you vomit. ?You suddenly feel lightheaded or dizzy. ?You have severe weakness, or unexplained weakness or fatigue. ?Your heart begins to beat quickly, or it feels like it is skipping beats. ?These symptoms may represent a serious problem that is an emergency. Do not wait to see if the symptoms will go away. Get medical help right away. Call your local emergency services (911 in the [place] Do not drive yourself to the hospital. ?

## 2022-03-23 NOTE — ED Provider Notes (Signed)
?Physical Exam  ?BP (!) 99/52   Pulse 85   Temp 98.1 ?F (36.7 ?C) (Oral)   Resp 16   Ht 5\' 8"  (1.727 m)   Wt 61.7 kg   LMP  (LMP Unknown)   SpO2 98%   BMI 20.68 kg/m?  ? ?Physical Exam ?Vitals and nursing note reviewed.  ?Constitutional:   ?   General: She is not in acute distress. ?   Appearance: Normal appearance. She is not ill-appearing or toxic-appearing.  ?Eyes:  ?   General: No scleral icterus. ?Cardiovascular:  ?   Rate and Rhythm: Normal rate.  ?   Pulses: Normal pulses.  ?Pulmonary:  ?   Effort: Pulmonary effort is normal. No respiratory distress.  ?   Breath sounds: Normal breath sounds.  ?Chest:  ?   Chest wall: No tenderness.  ?Abdominal:  ?   General: Bowel sounds are normal.  ?   Palpations: Abdomen is soft.  ?Skin: ?   General: Skin is dry.  ?   Findings: No rash.  ?Neurological:  ?   General: No focal deficit present.  ?   Mental Status: She is alert. Mental status is at baseline.  ?Psychiatric:     ?   Mood and Affect: Mood normal.  ? ? ?Procedures  ?Procedures ? ?ED Course / MDM  ?  ?Medical Decision Making ?Amount and/or Complexity of Data Reviewed ?Labs: ordered. ?Radiology: ordered. ? ?Risk ?Prescription drug management. ? ? ?Accepted handoff at shift change from [facility], PA-C. Please see prior provider note for more detail.  ? ?Briefly: Patient is 36 y.o. F presenting for chest tightness and SOB. ? ?DDX: concern for rising troponin. ? ?Plan: Check delta troponin and re-evaluate.  ? ?Accepted this patient in handoff.  Initial troponin was 9 with repeat of 8, delta -1.  The patient was hypertensive initially but was found to have an inappropriate cuff with inappropriate placement. The vitals also show tachypnea which is not accurate. Likely the patient moving. See nursing notes. The patient RR around 15-16.  Respirations even and unlabored.  ? ?On my assessment, the patient was somnolent with equal chest rise and unlabored breathing.  The patient easily awoken reports that she  is feeling much better after IV Ativan and would like to go home. She was a little too somnolent by my judgment for her to be safe going home. ? ?At this time, the patient's low risk [place].  I doubt any PE at this time.  Negative troponins and reassuring EKG doubt any ACS. ? ?On re-evaluation, the patient reports she is feeling better and would like to go home. She is well appearing and safe for discharge.  Return precautions discussed.  Patient verbalized understanding and agrees to plan.  Patient is stable and being discharged home in good condition. ? ?Results for orders placed or performed during the hospital encounter of 03/23/22  ?Resp Panel by RT-PCR (Flu A&B, Covid) Nasopharyngeal Swab  ? Specimen: Nasopharyngeal Swab; Nasopharyngeal(NP) swabs in vial transport medium  ?Result Value Ref Range  ? SARS Coronavirus 2 by RT PCR NEGATIVE NEGATIVE  ? Influenza A by PCR NEGATIVE NEGATIVE  ? Influenza B by PCR NEGATIVE NEGATIVE  ?CBC with Differential  ?Result Value Ref Range  ? WBC 11.3 (H) 4.0 - 10.5 K/uL  ? RBC 4.49 3.87 - 5.11 MIL/uL  ? Hemoglobin 12.4 12.0 - 15.0 g/dL  ? HCT 33.1 (L) 36.0 - 46.0 %  ? MCV 73.7 (L) 80.0 - 100.0  fL  ? MCH 27.6 26.0 - 34.0 pg  ? MCHC 37.5 (H) 30.0 - 36.0 g/dL  ? RDW 13.1 11.5 - 15.5 %  ? Platelets 381 150 - 400 K/uL  ? nRBC 1.1 (H) 0.0 - 0.2 %  ? Neutrophils Relative % 67 %  ? Neutro Abs 7.6 1.7 - 7.7 K/uL  ? Lymphocytes Relative 21 %  ? Lymphs Abs 2.4 0.7 - 4.0 K/uL  ? Monocytes Relative 7 %  ? Monocytes Absolute 0.8 0.1 - 1.0 K/uL  ? Eosinophils Relative 4 %  ? Eosinophils Absolute 0.4 0.0 - 0.5 K/uL  ? Basophils Relative 1 %  ? Basophils Absolute 0.1 0.0 - 0.1 K/uL  ? Immature Granulocytes 0 %  ? Abs Immature Granulocytes 0.04 0.00 - 0.07 K/uL  ?Comprehensive metabolic panel  ?Result Value Ref Range  ? Sodium 137 135 - 145 mmol/L  ? Potassium 4.1 3.5 - 5.1 mmol/L  ? Chloride 103 98 - 111 mmol/L  ? CO2 28 22 - 32 mmol/L  ? Glucose, Bld 118 (H) 70 - 99 mg/dL  ? BUN 10 6 - 20 mg/dL   ? Creatinine, Ser 0.74 0.44 - 1.00 mg/dL  ? Calcium 9.4 8.9 - 10.3 mg/dL  ? Total Protein 7.9 6.5 - 8.1 g/dL  ? Albumin 4.6 3.5 - 5.0 g/dL  ? AST 21 15 - 41 U/L  ? ALT 13 0 - 44 U/L  ? Alkaline Phosphatase 51 38 - 126 U/L  ? Total Bilirubin 0.9 0.3 - 1.2 mg/dL  ? GFR, Estimated >60 >60 mL/min  ? Anion gap 6 5 - 15  ?I-Stat beta hCG blood, ED  ?Result Value Ref Range  ? I-stat hCG, quantitative <5.0 <5 mIU/mL  ? Comment 3          ?Troponin I (High Sensitivity)  ?Result Value Ref Range  ? Troponin I (High Sensitivity) 9 <18 ng/L  ?Troponin I (High Sensitivity)  ?Result Value Ref Range  ? Troponin I (High Sensitivity) 8 <18 ng/L  ? ?CT Head Wo Contrast ? ?Result Date: 02/25/2022 ?CLINICAL DATA:  Syncope.  Head trauma with posterior headache. EXAM: CT HEAD WITHOUT CONTRAST TECHNIQUE: Contiguous axial images were obtained from the base of the skull through the vertex without intravenous contrast. RADIATION DOSE REDUCTION: This exam was performed according to the departmental dose-optimization program which includes automated exposure control, adjustment of the mA and/or kV according to patient size and/or use of iterative reconstruction technique. COMPARISON:  09/09/2019 FINDINGS: Brain: The brainstem, cerebellum, cerebral peduncles, thalami, basal ganglia, basilar cisterns, and ventricular system appear within normal limits. No intracranial hemorrhage, mass lesion, or acute CVA. Vascular: Unremarkable Skull: Unremarkable Sinuses/Orbits: Unremarkable Other: No supplemental non-categorized findings. IMPRESSION: 1. No significant intracranial abnormality is identified. Electronically Signed   By: Van Clines M.D.   On: 02/25/2022 20:59  ? ?DG Chest Portable 1 View ? ?Result Date: 03/23/2022 ?CLINICAL DATA:  36 year old female with history of shortness of breath. Mid chest pain. EXAM: PORTABLE CHEST 1 VIEW COMPARISON:  Chest x-ray 12/22/2021. FINDINGS: Lung volumes are normal. No consolidative airspace disease. No  pleural effusions. No pneumothorax. No pulmonary nodule or mass noted. Pulmonary vasculature and the cardiomediastinal silhouette are within normal limits. IMPRESSION: No radiographic evidence of acute cardiopulmonary disease. Electronically Signed   By: Vinnie Langton M.D.   On: 03/23/2022 06:01   ? ? ? ?  ?Sherrell Puller, PA-C ?03/27/22 [id number] ? ?  ?Lacretia Leigh, MD ?03/27/22 1713 ? ?

## 2022-03-23 NOTE — ED Provider Notes (Signed)
?[facility] DEPT ?Provider Note ? ? ?CSN: [id number] ?Arrival date & time: 03/23/22  0502 ? ?  ? ?History ? ?Chief Complaint  ?Patient presents with  ? Anxiety  ? ? ?Hannah Hawkins is a 36 y.o. female. ? ?HPI ?Patient is a 36 year old female with a history of sickle cell disease, cocaine use, who presents to the emergency department due to chest tightness.  Patient states she has a history of anxiety and has been working much more than normal recently and began feeling anxious overnight tonight.  She states that over the course the night she began developing her chest tightness with associated shortness of breath.  Denies any chest pain.  Also notes some mild congestion recently.  No sore throat.  No abdominal pain, nausea, vomiting, diarrhea.  States that her current symptoms feel consistent with prior anxiety exacerbations.  Denies any SI/HI.  Does endorse cocaine use about 4 days ago.   ?  ? ?Home Medications ?Prior to Admission medications   ?Medication Sig Start Date End Date Taking? Authorizing Provider  ?fluticasone (FLONASE) 50 MCG/ACT nasal spray Place 2 sprays into both nostrils daily. 08/14/21   Vevelyn Francois, NP  ?ibuprofen (ADVIL) 800 MG tablet Take 800 mg by mouth daily as needed for moderate pain. 12/24/21   [provider]  ?lidocaine (LIDODERM) 5 % Place 1 patch onto the skin daily. Remove & Discard patch within 12 hours or as directed by MD ?Patient not taking: Reported on 02/23/2022 06/19/21   Vevelyn Francois, NP  ?metroNIDAZOLE (FLAGYL) 500 MG tablet Take 1 tablet (500 mg total) by mouth 2 (two) times daily for 7 days. 03/22/22 03/29/22  Bo Merino I, NP  ?mirtazapine (REMERON) 15 MG tablet Take 1 tablet (15 mg total) by mouth at bedtime. ?Patient not taking: Reported on 01/21/2022 09/07/21 09/07/22  Vevelyn Francois, NP  ?montelukast (SINGULAIR) 10 MG tablet Take 1 tablet (10 mg total) by mouth at bedtime. ?Patient not taking: Reported on 01/21/2022 08/14/21   Vevelyn Francois, NP  ?NARCAN 4 MG/0.1ML LIQD nasal spray kit Place 4 mg into the nose once as needed (overdose). 12/22/17   [provider]  ?nitrofurantoin, macrocrystal-monohydrate, (MACROBID) 100 MG capsule Take 1 capsule (100 mg total) by mouth 2 (two) times daily for 7 days. 03/17/22 03/24/22  Bo Merino I, NP  ?norethindrone (MICRONOR) 0.35 MG tablet Take 1 tablet (0.35 mg total) by mouth daily. 09/08/21   Vevelyn Francois, NP  ?ondansetron (ZOFRAN) 8 MG tablet Take 8 mg by mouth every 8 (eight) hours as needed for nausea or vomiting. 12/24/21   [provider]  ?oseltamivir (TAMIFLU) 75 MG capsule Take 1 capsule (75 mg total) by mouth every 12 (twelve) hours. ?Patient not taking: Reported on 01/15/2022 12/23/21   Horton, Barbette Hair, MD  ?oxyCODONE (ROXICODONE) 15 MG immediate release tablet Take 1 tablet (15 mg total) by mouth every 6 (six) hours as needed for up to 15 days for pain (moderate to severe pain). 03/18/22 04/02/22  Tresa Garter, MD  ?pantoprazole (PROTONIX) 40 MG tablet Take 1 tablet (40 mg total) by mouth daily. ?Patient not taking: Reported on 03/17/2022 09/07/21 09/07/22  Vevelyn Francois, NP  ?risperiDONE (RISPERDAL) 1 MG tablet Take by mouth. ?Patient not taking: Reported on 03/17/2022 02/23/22   [provider]  ?risperiDONE (RISPERDAL) 2 MG tablet Take by mouth. 02/22/22   [provider]  ?tiZANidine (ZANAFLEX) 2 MG tablet Take 1 tablet (2 mg total)  by mouth every 6 (six) hours as needed for muscle spasms. ?Patient not taking: Reported on 03/17/2022 05/28/21   Vevelyn Francois, NP  ?   ? ?Allergies    ?Penicillins   ? ?Review of Systems   ?Review of Systems  ?All other systems reviewed and are negative. ?Ten systems reviewed and are negative for acute change, except as noted in the HPI.   ?Physical Exam ?Updated Vital Signs ?BP (!) 172/122   Pulse 65   Temp 98.1 ?F (36.7 ?C) (Oral)   Resp 15   Ht '5\' 8"'  (1.727 m)   Wt 61.7 kg   LMP  (LMP Unknown)   SpO2 100%   BMI  20.68 kg/m?  ?Physical Exam ?Vitals and nursing note reviewed.  ?Constitutional:   ?   General: She is not in acute distress. ?   Appearance: Normal appearance. She is not ill-appearing, toxic-appearing or diaphoretic.  ?HENT:  ?   Head: Normocephalic and atraumatic.  ?   Right Ear: External ear normal.  ?   Left Ear: External ear normal.  ?   Nose: Nose normal.  ?   Mouth/Throat:  ?   Mouth: Mucous membranes are moist.  ?   Pharynx: Oropharynx is clear. No oropharyngeal exudate or posterior oropharyngeal erythema.  ?Eyes:  ?   Extraocular Movements: Extraocular movements intact.  ?Cardiovascular:  ?   Rate and Rhythm: Normal rate and regular rhythm.  ?   Pulses: Normal pulses.  ?   Heart sounds: Normal heart sounds. No murmur heard. ?  No friction rub. No gallop.  ?Pulmonary:  ?   Effort: Pulmonary effort is normal. No respiratory distress.  ?   Breath sounds: Normal breath sounds. No stridor. No wheezing, rhonchi or rales.  ?Abdominal:  ?   General: Abdomen is flat.  ?   Tenderness: There is no abdominal tenderness.  ?Musculoskeletal:     ?   General: Normal range of motion.  ?   Cervical back: Normal range of motion and neck supple. No tenderness.  ?Skin: ?   General: Skin is warm and dry.  ?Neurological:  ?   General: No focal deficit present.  ?   Mental Status: She is alert and oriented to person, place, and time.  ?Psychiatric:     ?   Mood and Affect: Mood is anxious.     ?   Behavior: Behavior normal.     ?   Thought Content: Thought content does not include homicidal or suicidal ideation.  ? ?ED Results / Procedures / Treatments   ?Labs ?(all labs ordered are listed, but only abnormal results are displayed) ?Labs Reviewed  ?CBC WITH DIFFERENTIAL/PLATELET - Abnormal; Notable for the following components:  ?    Result Value  ? HCT 33.1 (*)   ? MCV 73.7 (*)   ? MCHC 37.5 (*)   ? All other components within normal limits  ?COMPREHENSIVE METABOLIC PANEL - Abnormal; Notable for the following components:  ?  Glucose, Bld 118 (*)   ? All other components within normal limits  ?RESP PANEL BY RT-PCR (FLU A&B, COVID) [id number]  ?I-STAT BETA HCG BLOOD, ED ([facility], [facility], [facility] ONLY)  ?TROPONIN I (HIGH SENSITIVITY)  ? ?EKG ?None ? ?Radiology ?DG Chest Portable 1 View ? ?Result Date: 03/23/2022 ?CLINICAL DATA:  36 year old female with history of shortness of breath. Mid chest pain. EXAM: PORTABLE CHEST 1 VIEW COMPARISON:  Chest x-ray 12/22/2021. FINDINGS: Lung volumes are normal. No consolidative airspace disease. No  pleural effusions. No pneumothorax. No pulmonary nodule or mass noted. Pulmonary vasculature and the cardiomediastinal silhouette are within normal limits. IMPRESSION: No radiographic evidence of acute cardiopulmonary disease. Electronically Signed   By: Vinnie Langton M.D.   On: 03/23/2022 06:01   ? ?Procedures ?Procedures  ? ?Medications Ordered in ED ?Medications  ?LORazepam (ATIVAN) tablet 1 mg (1 mg Oral Given 03/23/22 0558)  ? ?ED Course/ Medical Decision Making/ A&P ?  ?                        ?Medical Decision Making ?Amount and/or Complexity of Data Reviewed ?Labs: ordered. ?Radiology: ordered. ? ?Risk ?Prescription drug management. ? ?Pt is a 36 y.o. female who presents to the emergency department due to chest tightness and shortness of breath.  Reports a history of anxiety and states that her symptoms are consistent with prior anxiety exacerbations. ? ?Labs: ?CBC with a hematocrit of 33.1, MCV of 73.7, MCHC of 37.5. ?CMP with a glucose of 118. ?Troponin of 9. ?I-STAT beta-hCG less than 5. ?Respiratory panel is pending. ? ?Imaging: ?Chest x-ray shows no radiographic evidence of acute cardiopulmonary disease. ? ?I, Rayna Sexton, PA-C, personally reviewed and evaluated these images and lab results as part of my medical decision-making. ? ?On my exam heart is regular rate and rhythm without murmurs, rubs, or gallops.  Lungs are clear to auscultation bilaterally.  Abdomen is soft and nontender.  Obtained a basic cardiac  work-up as well as a respiratory panel as patient was also complaining of congestion.  Patient given a dose of Ativan for her anxiety. ? ?It is the end of my shift and patient care is being transferred to [facility]

## 2022-03-23 NOTE — ED Notes (Signed)
Pt resting on right side in fetal position.  Respirations even and unlabored. ?

## 2022-03-29 ENCOUNTER — Other Ambulatory Visit: Payer: Self-pay | Admitting: Internal Medicine

## 2022-03-29 DIAGNOSIS — D5720 Sickle-cell/Hb-C disease without crisis: Secondary | ICD-10-CM

## 2022-03-29 DIAGNOSIS — G894 Chronic pain syndrome: Secondary | ICD-10-CM

## 2022-03-29 MED ORDER — OXYCODONE HCL 15 MG PO TABS: 15.0000 mg | ORAL_TABLET | Freq: Four times a day (QID) | ORAL | 0 refills | Status: DC | PRN

## 2022-04-01 ENCOUNTER — Telehealth: Payer: Self-pay

## 2022-04-01 NOTE — Telephone Encounter (Signed)
Lidocaine 5% patch

## 2022-04-02 NOTE — Telephone Encounter (Signed)
PA form has been filled out and waiting for providers signature so form can be faxed over to [facility]. ?

## 2022-04-05 ENCOUNTER — Telehealth: Payer: Self-pay

## 2022-04-05 NOTE — Telephone Encounter (Signed)
Oxycodone  °

## 2022-04-06 ENCOUNTER — Telehealth: Payer: Self-pay

## 2022-04-06 ENCOUNTER — Other Ambulatory Visit: Payer: Self-pay | Admitting: Nurse Practitioner

## 2022-04-06 DIAGNOSIS — D5720 Sickle-cell/Hb-C disease without crisis: Secondary | ICD-10-CM

## 2022-04-06 DIAGNOSIS — G894 Chronic pain syndrome: Secondary | ICD-10-CM

## 2022-04-06 MED ORDER — OXYCODONE HCL 15 MG PO TABS: 15.0000 mg | ORAL_TABLET | Freq: Four times a day (QID) | ORAL | 0 refills | Status: DC | PRN

## 2022-04-06 NOTE — Telephone Encounter (Signed)
Oxycodone  °

## 2022-04-07 ENCOUNTER — Ambulatory Visit: Payer: Self-pay | Admitting: Nurse Practitioner

## 2022-04-11 ENCOUNTER — Encounter (HOSPITAL_COMMUNITY): Payer: Self-pay

## 2022-04-11 ENCOUNTER — Emergency Department (HOSPITAL_COMMUNITY)
Admission: EM | Admit: 2022-04-11 | Discharge: 2022-04-11 | Disposition: A | Discharge status: Home / Self Care | Payer: Medicaid Other | Attending: Emergency Medicine | Admitting: Emergency Medicine

## 2022-04-11 ENCOUNTER — Other Ambulatory Visit: Payer: Self-pay

## 2022-04-11 DIAGNOSIS — N760 Acute vaginitis: Secondary | ICD-10-CM | POA: Diagnosis not present

## 2022-04-11 DIAGNOSIS — N898 Other specified noninflammatory disorders of vagina: Secondary | ICD-10-CM | POA: Diagnosis present

## 2022-04-11 LAB — I-STAT BETA HCG BLOOD, ED (MC, WL, AP ONLY): I-stat hCG, quantitative: <5 m[IU]/mL (ref <5)

## 2022-04-11 LAB — WET PREP, GENITAL
Clue Cells Wet Prep HPF POC: NONE SEEN
Sperm: NONE SEEN
Trich, Wet Prep: NONE SEEN
WBC, Wet Prep HPF POC: >=10 — AB (ref <10)
Yeast Wet Prep HPF POC: NONE SEEN

## 2022-04-11 MED ORDER — AZITHROMYCIN 250 MG PO TABS
2000.0000 mg | ORAL_TABLET | Freq: Once | ORAL | Status: AC
  Administered 2022-04-11: 2000 mg via ORAL
  Filled 2022-04-11: qty 8

## 2022-04-11 MED ORDER — FLUCONAZOLE 150 MG PO TABS: 150.0000 mg | ORAL_TABLET | Freq: Every day | ORAL | 0 refills | Status: DC

## 2022-04-11 MED ORDER — GENTAMICIN SULFATE 40 MG/ML IJ SOLN
240.0000 mg | Freq: Once | INTRAMUSCULAR | Status: AC
  Administered 2022-04-11: 240 mg via INTRAMUSCULAR
  Filled 2022-04-11: qty 6

## 2022-04-11 MED ORDER — METRONIDAZOLE 500 MG PO TABS: 500.0000 mg | ORAL_TABLET | Freq: Two times a day (BID) | ORAL | 0 refills | Status: DC

## 2022-04-11 NOTE — ED Triage Notes (Signed)
Pt reports with vaginal burning x 3 days and states that she is lactating from both breasts. Pt reports having a period last month.  ?

## 2022-04-11 NOTE — Discharge Instructions (Addendum)
Take antibiotics as prescribed.  ? ?Take the Diflucan after finishing the antibiotics. ? ?Follow-up with your OBGYN regarding your breast problem. ?

## 2022-04-11 NOTE — ED Provider Notes (Signed)
?[facility] DEPT ?[facility] Emergency Department ?Provider Note ?MRN:  [id number]  ?Arrival date & time: 04/11/22    ? ?Chief Complaint   ?vaginal burning ?  ?History of Present Illness   ?Hannah Hawkins is a 36 y.o. year-old female presents to the ED with chief complaint of vaginal burning for the past 3 days.  She states that it feels rough.  Denies discharge.  She states that she took 2 pregnancy tests at home and one was positive and one was negative. ? ?Additionally, she states that when she squeezes her breasts some white "milk" like substance . ? ?History provided by patient. ? ? ?Review of Systems  ?Pertinent review of systems noted in HPI.  ? ? ?Physical Exam  ? ?Vitals:  ? 04/11/22 0016  ?BP: 119/74  ?Pulse: 61  ?Resp: 16  ?Temp: 98.2 ?F (36.8 ?C)  ?SpO2: 100%  ?  ?CONSTITUTIONAL:  well-appearing, NAD ?NEURO:  Alert and oriented x 3, CN 3-12 grossly intact ?EYES:  eyes equal and reactive ?ENT/NECK:  Supple, no stridor  ?CARDIO:  appears well-perfused  ?PULM:  No respiratory distress,  ?GI/GU:  non-distended, chaperone present during exam, thick white discharge is noted, no sores or lesions ?MSK/SPINE:  No gross deformities, no edema, moves all extremities  ?SKIN:  no rash, atraumatic ? ? ?*Additional and/or pertinent findings included in MDM below ? ?Diagnostic and Interventional Summary  ? ? EKG Interpretation ? ?Date/Time:    ?Ventricular Rate:    ?PR Interval:    ?QRS Duration:   ?QT Interval:    ?QTC Calculation:   ?R Axis:     ?Text Interpretation:   ?  ? ?  ? ?Labs Reviewed  ?WET PREP, GENITAL  ?URINALYSIS, ROUTINE W REFLEX MICROSCOPIC  ?I-STAT BETA HCG BLOOD, ED ([facility], [facility], [facility] ONLY)  ?GC/CHLAMYDIA PROBE AMP ([place]) NOT AT [facility]  ?  ?No orders to display  ?  ?Medications  ?azithromycin (ZITHROMAX) tablet 2,000 mg (has no administration in time range)  ?gentamicin (GARAMYCIN) injection 240 mg (has no administration in time range)  ?  ? ?Procedures  /  Critical Care ?Procedures ? ?ED  Course and Medical Decision Making  ?I have reviewed the triage vital signs, the nursing notes, and pertinent available records from the EMR. ? ?Complexity of Problems Addressed: ?Moderate Complexity: Acute illness with systemic symptoms, requiring diagnostic workup to rule out more severe disease. ?Comorbidities affecting this illness/injury include: ?None ?Social Determinants Affecting Care: ?Complexity of care is increased due to access to medical care. ? ? ?ED Course: ?After considering the following differential, vaginosis, STD, I ordered treatment for gonorrhea and chlamydia.  Pen allergic.  Given 2g azithro and 240mg  gentamycin. ?I personally interpreted the labs which are notable for negative pregnancy . ? ?  ? ?Consultants: ?No consultations were needed in caring for this patient. ? ?Treatment and Plan: ? ? ?Emergency department workup does not suggest an emergent condition requiring admission or immediate intervention beyond  what has been performed at this time. The patient is safe for discharge and has  been instructed to return immediately for worsening symptoms, change in  symptoms or any other concerns ? ? ? ?Final Clinical Impressions(s) / ED Diagnoses  ? ?  ICD-10-CM   ?1. Acute vaginitis  N76.0   ?  ?  ?ED Discharge Orders   ? ?      Ordered  ?  metroNIDAZOLE (FLAGYL) 500 MG tablet  2 times daily       ?  04/11/22 0239  ?  fluconazole (DIFLUCAN) 150 MG tablet  Daily       ? 04/11/22 0239  ? ?  ?  ? ?  ?  ? ? ?Discharge Instructions Discussed with and Provided to Patient:  ? ? ? ?Discharge Instructions   ? ?  ?Take antibiotics as prescribed.  ? ?Take the Diflucan after finishing the antibiotics. ? ?Follow-up with your OBGYN regarding your breast problem. ? ? ? ? ?  ?Montine Circle, PA-C ?04/11/22 0243 ? ?  ?Ripley Fraise, MD ?04/11/22 0327 ? ?

## 2022-04-11 NOTE — ED Notes (Signed)
Pelvic cart set up at bedside  

## 2022-04-12 LAB — GC/CHLAMYDIA PROBE AMP (~~LOC~~) NOT AT ARMC
Chlamydia: NEGATIVE
Comment: NEGATIVE
Comment: NORMAL
Neisseria Gonorrhea: NEGATIVE

## 2022-04-16 ENCOUNTER — Emergency Department (HOSPITAL_COMMUNITY): Payer: Medicaid Other

## 2022-04-16 ENCOUNTER — Encounter (HOSPITAL_COMMUNITY): Payer: Self-pay

## 2022-04-16 ENCOUNTER — Emergency Department (HOSPITAL_COMMUNITY)
Admission: EM | Admit: 2022-04-16 | Discharge: 2022-04-16 | Disposition: A | Discharge status: Home / Self Care | Payer: Medicaid Other | Attending: Emergency Medicine | Admitting: Emergency Medicine

## 2022-04-16 ENCOUNTER — Other Ambulatory Visit: Payer: Self-pay

## 2022-04-16 DIAGNOSIS — F410 Panic disorder [episodic paroxysmal anxiety] without agoraphobia: Secondary | ICD-10-CM | POA: Diagnosis present

## 2022-04-16 DIAGNOSIS — F419 Anxiety disorder, unspecified: Secondary | ICD-10-CM

## 2022-04-16 DIAGNOSIS — G8929 Other chronic pain: Secondary | ICD-10-CM | POA: Insufficient documentation

## 2022-04-16 DIAGNOSIS — G894 Chronic pain syndrome: Secondary | ICD-10-CM

## 2022-04-16 DIAGNOSIS — R0602 Shortness of breath: Secondary | ICD-10-CM | POA: Insufficient documentation

## 2022-04-16 DIAGNOSIS — D57819 Other sickle-cell disorders with crisis, unspecified: Secondary | ICD-10-CM | POA: Insufficient documentation

## 2022-04-16 MED ORDER — OXYCODONE HCL 5 MG PO TABS
15.0000 mg | ORAL_TABLET | Freq: Once | ORAL | Status: AC
  Administered 2022-04-16: 15 mg via ORAL
  Filled 2022-04-16: qty 3

## 2022-04-16 MED ORDER — TIZANIDINE HCL 4 MG PO TABS
2.0000 mg | ORAL_TABLET | Freq: Once | ORAL | Status: AC
  Administered 2022-04-16: 2 mg via ORAL
  Filled 2022-04-16: qty 1

## 2022-04-16 NOTE — ED Triage Notes (Signed)
Pt BIB EMS with reports of anxiety since last night. Pt states that she is out of her Risperidone.  ?

## 2022-04-16 NOTE — ED Provider Notes (Signed)
?[facility] DEPT ?Provider Note ? ? ?CSN: [id number] ?Arrival date & time: 04/16/22  0249 ? ?  ? ?History ? ?Chief Complaint  ?Patient presents with  ? Anxiety  ? ? ?Hannah Hawkins is a 36 y.o. female who presents today for evaluation of multiple complaints. ?Triage note reports that patient states she has been anxious since last night when she ran out of her risperidone. ?Patient tells me that she has not in fact run out of her risperidone.  She only takes it about every 2 days as she does not like how it makes her feel. ?She tells me that initially what brought her to the emergency room where she felt like she was having a panic attack and anxious.  She has a history of these and states that this felt similar. 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  She states she took ibuprofen about 4 hours ago.  She has not taken her home oxycodone in multiple hours.   She currently denies any chest pain or shortness of breath.  Her primary concern is pain in her entire body including both of her arms, legs, and her back.  She states that this is consistent with a sickle cell pain flare for her. ? ?She denies any changes to bowel or bladder function.  No saddle anesthesia or paresthesia.  No fevers. ? ?HPI ? ?  ? ?Home Medications ?Prior to Admission medications   ?Medication Sig Start Date End Date Taking? Authorizing Provider  ?fluconazole (DIFLUCAN) 150 MG tablet Take 1 tablet (150 mg total) by mouth daily. 04/11/22   Montine Circle, PA-C  ?fluticasone (FLONASE) 50 MCG/ACT nasal spray Place 2 sprays into both nostrils daily. 08/14/21   Vevelyn Francois, NP  ?ibuprofen (ADVIL) 800 MG tablet Take 800 mg by mouth daily as needed for moderate pain. 12/24/21   [provider]  ?Lidocaine (HM LIDOCAINE PATCH) 4 % PTCH Apply 1 patch topically daily.  03/23/22   Bo Merino I, NP  ?metroNIDAZOLE (FLAGYL) 500 MG tablet Take 1 tablet (500 mg total) by mouth 2 (two) times daily. 04/11/22   Montine Circle, PA-C  ?mirtazapine (REMERON) 15 MG tablet Take 1 tablet (15 mg total) by mouth at bedtime. ?Patient not taking: Reported on 01/21/2022 09/07/21 09/07/22  Vevelyn Francois, NP  ?montelukast (SINGULAIR) 10 MG tablet Take 1 tablet (10 mg total) by mouth at bedtime. ?Patient not taking: Reported on 01/21/2022 08/14/21   Vevelyn Francois, NP  ?NARCAN 4 MG/0.1ML LIQD nasal spray kit Place 4 mg into the nose once as needed (overdose). 12/22/17   [provider]  ?norethindrone (MICRONOR) 0.35 MG tablet Take 1 tablet (0.35 mg total) by mouth daily. 09/08/21   Vevelyn Francois, NP  ?ondansetron (ZOFRAN) 8 MG tablet Take 8 mg by mouth every 8 (eight) hours as needed for nausea or vomiting. 12/24/21   [provider]  ?oseltamivir (TAMIFLU) 75 MG capsule Take 1 capsule (75 mg total) by mouth every 12 (twelve) hours. ?Patient not taking: Reported on 01/15/2022 12/23/21   Horton, Barbette Hair, MD  ?oxyCODONE (ROXICODONE) 15 MG immediate release tablet Take 1 tablet (15 mg total) by mouth every 6 (six) hours as needed for up to 15 days for pain (moderate to severe pain). 04/06/22 04/21/22  Bo Merino I, NP  ?pantoprazole (PROTONIX)  40 MG tablet Take 1 tablet (40 mg total) by mouth daily. ?Patient not taking: Reported on 03/17/2022 09/07/21 09/07/22  Vevelyn Francois, NP  ?risperiDONE (RISPERDAL) 1 MG tablet Take by mouth. ?Patient not taking: Reported on 03/17/2022 02/23/22   [provider]  ?risperiDONE (RISPERDAL) 2 MG tablet Take by mouth. 02/22/22   [provider]  ?tiZANidine (ZANAFLEX) 2 MG tablet Take 1 tablet (2 mg total) by mouth every 6 (six) hours as needed for muscle spasms. ?Patient not taking: Reported on 03/17/2022 05/28/21   Vevelyn Francois, NP  ?   ? ?Allergies    ?Penicillins   ? ?Review of Systems   ?Review of Systems ? ?Physical Exam ?Updated  Vital Signs ?BP 110/77   Pulse 74   Temp 97.6 ?F (36.4 ?C) (Oral)   Resp 16   LMP 04/14/2022   SpO2 100%  ?Physical Exam ?Vitals and nursing note reviewed.  ?Constitutional:   ?   General: She is not in acute distress. ?   Appearance: She is not diaphoretic.  ?HENT:  ?   Head: Normocephalic and atraumatic.  ?Eyes:  ?   General: No scleral icterus.    ?   Right eye: No discharge.     ?   Left eye: No discharge.  ?   Conjunctiva/sclera: Conjunctivae normal.  ?Cardiovascular:  ?   Rate and Rhythm: Normal rate and regular rhythm.  ?   Heart sounds: Normal heart sounds.  ?Pulmonary:  ?   Effort: Pulmonary effort is normal. No respiratory distress.  ?   Breath sounds: Normal breath sounds. No stridor.  ?Abdominal:  ?   General: There is no distension.  ?Musculoskeletal:     ?   General: No deformity.  ?   Cervical back: Normal range of motion and neck supple.  ?   Comments: TTP over lumbar paraspinal muscles bilaterally.  No discrete midline tenderness.  Palpation over paraspinal muscles recreates and exacerbates her reported pain. ?She is able to move around in the bed without difficulty.  ?Skin: ?   General: Skin is warm and dry.  ?Neurological:  ?   Mental Status: She is alert.  ?   Motor: No abnormal muscle tone.  ?   Comments: Patient is awake and alert.  Speech is not slurred.  Answers questions appropriately without difficulty.  ?Psychiatric:     ?   Mood and Affect: Mood normal.     ?   Behavior: Behavior normal.  ? ? ?ED Results / Procedures / Treatments   ?Labs ?(all labs ordered are listed, but only abnormal results are displayed) ?Labs Reviewed - No data to display ? ?EKG ?None ? ?Radiology ?DG Chest 2 View ? ?Result Date: 04/16/2022 ?CLINICAL DATA:  Shortness of breath, nasal and chest congestion. EXAM: CHEST - 2 VIEW COMPARISON:  03/23/2022. FINDINGS: The heart size and mediastinal contours are within normal limits. Both lungs are clear. No acute osseous abnormality. IMPRESSION: No active cardiopulmonary  disease. Electronically Signed   By: Brett Fairy M.D.   On: 04/16/2022 04:44   ? ?Procedures ?Procedures  ? ? ?Medications Ordered in ED ?Medications  ?oxyCODONE (Oxy IR/ROXICODONE) immediate release tablet 15 mg (15 mg Oral Given 04/16/22 0509)  ?tiZANidine (ZANAFLEX) tablet 2 mg (2 mg Oral Given 04/16/22 0510)  ? ? ?ED Course/ Medical Decision Making/ A&P ?  ?                        ?  Medical Decision Making ?Patient is a 36 year old woman who initially presented for what she describes as an anxiety attack with chest pain and shortness of breath, however she states that that has resolved and that while walking into the emergency room with it being cold and rainy outside, combined with her being outside for 12 hours today during the day she has developed what she reports as a sickle cell pain flare.  This is in her usual locations, and she denies any complicating symptoms or concerns. ?With her report of chest pain and shortness of breath during which she describes as an anxiety attack, out of an abundance of caution with her underlying sickle cell EKG and chest x-ray are obtained even though her symptoms are fully resolved.  These are without significant abnormalities.  Doubt ACS, PE.  Patient states that the symptoms are consistent with anxiety attack she has had previously. ? ?Regarding patient's pain she does not have any red flag symptoms including no fevers, no changes to bowel or bladder function, no saddle anesthesia or paresthesia, and her pain is diffuse on my exam. ?Patient is afebrile, not tachycardic or tachypneic and generally well-appearing. ? ?As patient has not taken her home narcotic pain medication and multiple hours, nor has she taken any muscle relaxers she is given a dose of her home narcotic pain medications, and a dose of Zanaflex that she has previously been prescribed that with relief. ?As patient appears clinically well, has reassuring vitals, and her primary presenting concern bringing  her in today was anxiety I do not think that IV narcotics are appropriate or indicated at this time. ?I did offer patient to allow to wait in the emergency room to evaluate her response to the p.o. pain medicat

## 2022-04-16 NOTE — Discharge Instructions (Addendum)
Please make sure you are drinking plenty of fluids and staying well hydrated.  ?Today you received medications that may make you sleepy or impair your ability to make decisions.  For the next 24 hours please do not drive, operate heavy machinery, care for a small child with out another adult present, or perform any activities that may cause harm to you or someone else if you were to fall asleep or be impaired.  ? ? ?

## 2022-04-19 ENCOUNTER — Telehealth: Payer: Self-pay | Admitting: Nurse Practitioner

## 2022-04-19 NOTE — Telephone Encounter (Signed)
Oxycodone refill request.

## 2022-04-20 ENCOUNTER — Other Ambulatory Visit: Payer: Self-pay | Admitting: Nurse Practitioner

## 2022-04-20 DIAGNOSIS — G894 Chronic pain syndrome: Secondary | ICD-10-CM

## 2022-04-20 DIAGNOSIS — D5720 Sickle-cell/Hb-C disease without crisis: Secondary | ICD-10-CM

## 2022-04-20 MED ORDER — OXYCODONE HCL 15 MG PO TABS: 15.0000 mg | ORAL_TABLET | Freq: Four times a day (QID) | ORAL | 0 refills | Status: DC | PRN

## 2022-04-27 ENCOUNTER — Telehealth: Payer: Self-pay

## 2022-04-27 NOTE — Telephone Encounter (Signed)
oxyCODONE (ROXICODONE) 15 MG immediate release tablet ? ?PA started on 04/27/22 ?

## 2022-04-29 NOTE — Telephone Encounter (Signed)
Confirmation #:[id number] WBenefit Plan:MCAIDHealth Plan:[id number] ?Prior Approval #:[id number] PA Type:PHARMACYRecipient:Hannah A LAMPTEYRecipient IO:[id number] RBilling Provider:Billing Provider LA:[id number] Provider Name:[facility] ZY:[id number] ?Submission Date:05/09/2023Status:APPROVEDEffective Begin Date:05/09/2023Effective End Date:11/05/2023Payer:[place] [employer]HS DIV OF HEALTH BENEFITS# of Attachments:1 ? ?

## 2022-05-03 ENCOUNTER — Telehealth: Payer: Self-pay

## 2022-05-03 NOTE — Telephone Encounter (Signed)
Oxycodone  °

## 2022-05-04 ENCOUNTER — Other Ambulatory Visit: Payer: Self-pay | Admitting: Nurse Practitioner

## 2022-05-04 DIAGNOSIS — D5720 Sickle-cell/Hb-C disease without crisis: Secondary | ICD-10-CM

## 2022-05-04 DIAGNOSIS — G894 Chronic pain syndrome: Secondary | ICD-10-CM

## 2022-05-04 MED ORDER — OXYCODONE HCL 15 MG PO TABS: 15.0000 mg | ORAL_TABLET | Freq: Four times a day (QID) | ORAL | 0 refills | Status: DC | PRN

## 2022-05-10 ENCOUNTER — Emergency Department (HOSPITAL_COMMUNITY)
Admission: EM | Admit: 2022-05-10 | Discharge: 2022-05-11 | Disposition: A | Discharge status: Home / Self Care | Payer: Medicaid Other | Attending: Emergency Medicine | Admitting: Emergency Medicine

## 2022-05-10 ENCOUNTER — Ambulatory Visit (HOSPITAL_COMMUNITY)
Admission: EM | Admit: 2022-05-10 | Discharge: 2022-05-10 | Disposition: A | Discharge status: Home / Self Care | Payer: Medicaid Other | Attending: Psychiatry | Admitting: Psychiatry

## 2022-05-10 ENCOUNTER — Other Ambulatory Visit: Payer: Self-pay

## 2022-05-10 ENCOUNTER — Encounter (HOSPITAL_COMMUNITY): Payer: Self-pay

## 2022-05-10 DIAGNOSIS — R443 Hallucinations, unspecified: Secondary | ICD-10-CM

## 2022-05-10 DIAGNOSIS — D5700 Hb-SS disease with crisis, unspecified: Secondary | ICD-10-CM | POA: Diagnosis not present

## 2022-05-10 DIAGNOSIS — E876 Hypokalemia: Secondary | ICD-10-CM | POA: Insufficient documentation

## 2022-05-10 DIAGNOSIS — R440 Auditory hallucinations: Secondary | ICD-10-CM | POA: Insufficient documentation

## 2022-05-10 DIAGNOSIS — M791 Myalgia, unspecified site: Secondary | ICD-10-CM | POA: Diagnosis present

## 2022-05-10 LAB — BASIC METABOLIC PANEL
Anion gap: 7 (ref 5–15)
BUN: 7 mg/dL (ref 6–20)
CO2: 27 mmol/L (ref 22–32)
Calcium: 9.3 mg/dL (ref 8.9–10.3)
Chloride: 105 mmol/L (ref 98–111)
Creatinine, Ser: 0.71 mg/dL (ref 0.44–1.00)
GFR, Estimated: >60 mL/min (ref >60)
Glucose, Bld: 103 mg/dL — ABNORMAL HIGH (ref 70–99)
Potassium: 3.1 mmol/L — ABNORMAL LOW (ref 3.5–5.1)
Sodium: 139 mmol/L (ref 135–145)

## 2022-05-10 LAB — CBC WITH DIFFERENTIAL/PLATELET
Abs Immature Granulocytes: 0.03 10*3/uL (ref 0.00–0.07)
Basophils Absolute: 0.1 10*3/uL (ref 0.0–0.1)
Basophils Relative: 1 %
Eosinophils Absolute: 1.1 10*3/uL — ABNORMAL HIGH (ref 0.0–0.5)
Eosinophils Relative: 12 %
HCT: 30.6 % — ABNORMAL LOW (ref 36.0–46.0)
Hemoglobin: 11.4 g/dL — ABNORMAL LOW (ref 12.0–15.0)
Immature Granulocytes: 0 %
Lymphocytes Relative: 27 %
Lymphs Abs: 2.5 10*3/uL (ref 0.7–4.0)
MCH: 27.3 pg (ref 26.0–34.0)
MCHC: 37.3 g/dL — ABNORMAL HIGH (ref 30.0–36.0)
MCV: 73.2 fL — ABNORMAL LOW (ref 80.0–100.0)
Monocytes Absolute: 1.1 10*3/uL — ABNORMAL HIGH (ref 0.1–1.0)
Monocytes Relative: 12 %
Neutro Abs: 4.5 10*3/uL (ref 1.7–7.7)
Neutrophils Relative %: 48 %
Platelets: 384 10*3/uL (ref 150–400)
RBC: 4.18 MIL/uL (ref 3.87–5.11)
RDW: 13.7 % (ref 11.5–15.5)
WBC: 9.3 10*3/uL (ref 4.0–10.5)
nRBC: 2.7 % — ABNORMAL HIGH (ref 0.0–0.2)

## 2022-05-10 LAB — RETICULOCYTES
Immature Retic Fract: 32.1 % — ABNORMAL HIGH (ref 2.3–15.9)
RBC.: 4.13 MIL/uL (ref 3.87–5.11)
Retic Count, Absolute: 155.7 10*3/uL (ref 19.0–186.0)
Retic Ct Pct: 3.8 % — ABNORMAL HIGH (ref 0.4–3.1)

## 2022-05-10 LAB — I-STAT BETA HCG BLOOD, ED (MC, WL, AP ONLY): I-stat hCG, quantitative: <5 m[IU]/mL (ref <5)

## 2022-05-10 MED ORDER — RISPERIDONE 2 MG PO TABS: 2.0000 mg | ORAL_TABLET | Freq: Every day | ORAL | 0 refills | Status: DC

## 2022-05-10 MED ORDER — HYDROMORPHONE HCL 2 MG/ML IJ SOLN
2.0000 mg | INTRAMUSCULAR | Status: DC
  Filled 2022-05-10: qty 1

## 2022-05-10 MED ORDER — KETOROLAC TROMETHAMINE 15 MG/ML IJ SOLN
30.0000 mg | INTRAMUSCULAR | Status: AC
  Administered 2022-05-10: 30 mg via INTRAMUSCULAR
  Filled 2022-05-10: qty 2

## 2022-05-10 MED ORDER — DIPHENHYDRAMINE HCL 25 MG PO CAPS
25.0000 mg | ORAL_CAPSULE | ORAL | Status: DC | PRN
  Administered 2022-05-10: 25 mg via ORAL
  Filled 2022-05-10: qty 1

## 2022-05-10 MED ORDER — ONDANSETRON 8 MG PO TBDP
8.0000 mg | ORAL_TABLET | Freq: Three times a day (TID) | ORAL | Status: DC | PRN
  Administered 2022-05-10: 8 mg via ORAL
  Filled 2022-05-10: qty 1

## 2022-05-10 MED ORDER — OXYCODONE HCL 5 MG PO TABS: 15.0000 mg | ORAL_TABLET | Freq: Once | ORAL | Status: DC

## 2022-05-10 MED ORDER — HYDROMORPHONE HCL 2 MG/ML IJ SOLN
2.0000 mg | INTRAMUSCULAR | Status: AC
  Administered 2022-05-10: 2 mg via SUBCUTANEOUS
  Filled 2022-05-10: qty 1

## 2022-05-10 NOTE — Progress Notes (Signed)
   05/10/22 1215  [place] (Walk-ins at [facility] only)  How Did You Hear About [place]? Legal System  What Is the Reason for Your Visit/Call Today? Patient presents voluntarily via [employer] after presenting to a [facility] [place] [address] for "help" informing them she needed someone to talk to.  Church staff called [employer] for assistance.  Patient states she was admitted to [facility] in March 2023.  She states she was ill and missed her f/u appointment for med management.  She is Rx Risperdal 2mg  QHS.  She mentioned she has missed doses on occasion and now she has one remaining tablet.  She states she would like "to check myself in for therapy and medicine."  She admits to hearing voices that say "negative and positive things."  She describes feeling an evil spirt hovering over her when she woke up one morning recently.  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  She yawns throughout triage assessment, and does not appear to be fully alert as she is quite groggy.  Patient denies SI, HI or SA hx.  Patient continues to request to"admit myself."  After revisiting the topic of criteria for inpatient admission several times, finally patient understood the recommendation is for outpatient treatment.  She is in agreement with recommendation for outpatient treatment with [facility].  She will be provided with walk in hours.  How Long Has This Been Causing You Problems? 1-6 months  Have You Recently Had Any Thoughts About Hurting Yourself? No  Are You Planning to Commit Suicide/Harm Yourself At This time? No  Have you Recently Had Thoughts About [place]? No  Are You Planning To Harm Someone At This Time? No  Are you currently experiencing any auditory, visual or other hallucinations? Yes  Please explain the hallucinations you are currently experiencing: Patient reports she sometimes sees shadows and hears voices more often, "negative and positive" content.  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  Have You Used Any Alcohol or Drugs in the Past 24 Hours? No  Do you have any current medical co-morbidities that require immediate attention? No  Clinician description of patient physical appearance/behavior: Patient is calm, cooperative, casually dressed AAOx5  What Do You Feel Would Help You the Most Today? Medication(s)  If access to [facility] was not available, would you have sought care in the Emergency Department? No  Determination of Need Routine (7 days)  Options For Referral Medication Management

## 2022-05-10 NOTE — ED Triage Notes (Signed)
Pt reports with [facility] since today. Pt complains of pain all over with fatigue.

## 2022-05-10 NOTE — ED Provider Notes (Signed)
Behavioral Health Urgent Care Medical Screening Exam  Patient Name: Hannah Hawkins Stoffer MRN: [id number] Date of Evaluation: 05/10/22 Chief Complaint:   Diagnosis:  Final diagnoses:  Hallucinations    History of Present illness: Hannah Hawkins Stidham is a 36 y.o. female patient [facility] after presenting to a church wanting to talk to someone.  Staff called [employer].  She has complaints of increased auditory hallucinations.    Hannah Hawkins Buske, 36 y.o., female patient seen face to face by this provider, consulted with Dr. Lucianne MussKumar; and chart reviewed on 05/10/22.  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  She has [facility]  She was prescribed Risperdal 2 mg nightly.  She has no current outpatient psychiatric services in place.  On evaluation Hannah Hawkins Crail reports after [facility] in February she was [facility] and she missed her appointment.  Reports she has 1 dose of her medication left.  She reports taking the medication inconsistently.  She may skip several days at a time.  Patient states she came in today to have someone to talk to.  She would like to come back later this evening or later in the week and check herself and for a few days.  When asked how could and admission be helpful she states, "think it would be good for me to talk about medicines and get some more therapy". Educated patient on the benefit of an outpatient provider/services.  During evaluation Hannah Hawkins Heinlen is alert/oriented x4.  She does not appear to be in any acute distress.  She is cooperative and makes good eye contact.  She is well-groomed.  Her speech and behavior are normal.Patient  admits that she is working long hours.  She yawns throughout the assessment and appears sleepy.  She eats roughly 1 meal a day due to her work schedule.  Reports she does sleep at nighttime but the amount depends on her work schedule. Patient endorses auditory hallucinations.  She hears voices that tell her negative and positive things.  She does not describe her voices as being scary or frightening in any way.  She denies voices being command in nature and states it is one to multiple voices.  She also reports that a few days ago she woke up and felt there was an evil spirit hovering over her but that has since resolved.  She endorses visual hallucinations roughly a week ago.  States she was seeing shapes and shadows.  She reports that she has had ongoing hallucinations for a long time but they had improved when she was admitted to the hospital.  Objectively there is no indication that she is responding to internal/external stimuli, delusional thoughts or paranoid thought content.  She denies SI/HI.  She denies any previous suicide attempt.  She contracts for safety.  Patient is logical and was able to answer questions appropriately.  Discussed outpatient psychiatric resources for [facility] the [facility].  Explained in the length open access and walk-in hours.  Explained the importance of outpatient psychiatric services.  Provided patient with a 2-week prescription for risperidone 2 mg nightly.  At this time Hannah Hawkins Checketts is educated and verbalizes understanding of mental health resources and other crisis services in the  community.  She is instructed to call 911 and present to the nearest emergency room should she experience any suicidal/homicidal ideation, auditory/visual/hallucinations, or detrimental worsening of her mental health condition.  She was a also advised by Clinical research associate that she could call the toll-free phone on insurance card to assist with identifying in  network counselors and agencies or number on back of Medicaid card to speak with care coordinator    Psychiatric Specialty Exam  Presentation  General Appearance:Appropriate for Environment; Casual  Eye Contact:Good  Speech:Clear and Coherent; Normal Rate  Speech Volume:Normal  Handedness:Right   Mood and Affect  Mood:Euthymic  Affect:Congruent   Thought Process  Thought Processes:Coherent  Descriptions of Associations:Intact  Orientation:Full (Time, Place and Person)  Thought Content:Logical    Hallucinations:None  Ideas of Reference:None  Suicidal Thoughts:No  Homicidal Thoughts:No data recorded  Sensorium  Memory:Immediate Good; Recent Good; Remote Good  Judgment:Good  Insight:Good   Executive Functions  Concentration:Good  Attention Span:Good  Recall:Good  Fund of Knowledge:Good  Language:Good   Psychomotor Activity  Psychomotor Activity:Normal   Assets  Assets:Communication Skills; Desire for Improvement; Financial Resources/Insurance; Physical Health; Resilience   Sleep  Sleep:Fair  Number of hours: No data recorded  No data recorded  Physical Exam: Physical Exam Vitals and nursing note reviewed.  Constitutional:      General: She is not in acute distress.    Appearance: Normal appearance. She is not ill-appearing.  HENT:     Head: Normocephalic.  Eyes:     General:        Right eye: No discharge.        Left eye: No discharge.     Conjunctiva/sclera: Conjunctivae normal.  Cardiovascular:     Rate and Rhythm: Normal rate.  Pulmonary:     Effort: Pulmonary effort is normal. No respiratory distress.  Musculoskeletal:        General: Normal range of motion.     Cervical back: Normal range of motion.  Skin:    Coloration: Skin is not jaundiced or pale.  Neurological:     Mental Status: She is alert and oriented to person, place, and time.  Psychiatric:        Attention and Perception: She perceives auditory and  visual hallucinations.        Mood and Affect: Mood is anxious and depressed. Affect is tearful.        Speech: Speech normal.        Behavior: Behavior normal. Behavior is cooperative.        Thought Content: Thought content normal.        Cognition and Memory: Cognition normal.        Judgment: Judgment normal.   Review of Systems  Constitutional: Negative.   HENT: Negative.    Eyes: Negative.   Respiratory: Negative.    Cardiovascular: Negative.   Musculoskeletal: Negative.   Skin: Negative.   Neurological: Negative.   Psychiatric/Behavioral:  Positive for depression and hallucinations. The patient is nervous/anxious.   Last menstrual period 04/14/2022. There is no height or weight on file to calculate BMI.  Musculoskeletal: Strength & Muscle Tone: within normal limits Gait & Station: normal Patient leans: N/A   [facility] Discharge Disposition for Follow up and Recommendations: Based on my evaluation the patient does not appear to have an emergency medical condition and can be discharged with resources and follow up care in outpatient services for Medication Management and Individual Therapy  Discharge patient  Provided patient with a 2-week prescription  for Risperdal 2 mg nightly with instructions to follow-up with [facility] behavioral health outpatient services on the second floor for medication management and therapy.  No evidence of imminent risk to self or others at present.    Patient does not meet criteria for psychiatric inpatient admission. Discussed crisis plan, support from social network, calling 911, coming to the Emergency Department, and calling Suicide Hotline.    Ardis Hughs, NP 05/10/2022, 1:51 PM

## 2022-05-10 NOTE — ED Provider Notes (Signed)
[facility] DEPT Provider Note   CSN: [id number] Arrival date & time: 05/10/22  1930     History {Add pertinent medical, surgical, social history, OB history to HPI:1} Chief Complaint  Patient presents with   Sickle Cell Pain Crisis    Hannah Hawkins is a 36 y.o. female.   Sickle Cell Pain Crisis     Home Medications Prior to Admission medications   Medication Sig Start Date End Date Taking? Authorizing Provider  fluconazole (DIFLUCAN) 150 MG tablet Take 1 tablet (150 mg total) by mouth daily. 04/11/22   Montine Circle, PA-C  fluticasone (FLONASE) 50 MCG/ACT nasal spray Place 2 sprays into both nostrils daily. 08/14/21   Vevelyn Francois, NP  ibuprofen (ADVIL) 800 MG tablet Take 800 mg by mouth daily as needed for moderate pain. 12/24/21   [provider]  Lidocaine (HM LIDOCAINE PATCH) 4 % PTCH Apply 1 patch topically daily. 03/23/22   Passmore, Jake Church I, NP  metroNIDAZOLE (FLAGYL) 500 MG tablet Take 1 tablet (500 mg total) by mouth 2 (two) times daily. 04/11/22   Montine Circle, PA-C  montelukast (SINGULAIR) 10 MG tablet Take 1 tablet (10 mg total) by mouth at bedtime. Patient not taking: Reported on 01/21/2022 08/14/21   Vevelyn Francois, NP  [facility] 4 MG/0.1ML LIQD nasal spray kit Place 4 mg into the nose once as needed (overdose). 12/22/17   [provider]  norethindrone (MICRONOR) 0.35 MG tablet Take 1 tablet (0.35 mg total) by mouth daily. 09/08/21   Vevelyn Francois, NP  ondansetron (ZOFRAN) 8 MG tablet Take 8 mg by mouth every 8 (eight) hours as needed for nausea or vomiting. 12/24/21   [provider]  oxyCODONE (ROXICODONE) 15 MG immediate release tablet Take 1 tablet (15 mg total) by mouth every 6 (six) hours as needed for up to 15 days for pain (moderate to severe pain). 05/04/22 05/19/22  Passmore, Jake Church I, NP  pantoprazole (PROTONIX) 40 MG tablet Take 1 tablet (40 mg total) by mouth daily. Patient not taking: Reported on  03/17/2022 09/07/21 09/07/22  Vevelyn Francois, NP  risperiDONE (RISPERDAL) 2 MG tablet Take 1 tablet (2 mg total) by mouth at bedtime. 05/10/22   Revonda Humphrey, NP      Allergies    Penicillins    Review of Systems   Review of Systems  Physical Exam Updated Vital Signs BP 118/78 (BP Location: Left Arm)   Pulse 85   Temp 98.5 F (36.9 C) (Oral)   Resp 14   LMP 04/14/2022   SpO2 97%  Physical Exam  ED Results / Procedures / Treatments   Labs (all labs ordered are listed, but only abnormal results are displayed) Labs Reviewed  CBC WITH DIFFERENTIAL/PLATELET  COMPREHENSIVE METABOLIC PANEL    EKG None  Radiology No results found.  Procedures Procedures  {Document cardiac monitor, telemetry assessment procedure when appropriate:1}  Medications Ordered in ED Medications - No data to display  ED Course/ Medical Decision Making/ A&P                           Medical Decision Making Amount and/or Complexity of Data Reviewed Labs: ordered.   ***  {Document critical care time when appropriate:1} {Document review of labs and clinical decision tools ie heart score, Chads2Vasc2 etc:1}  {Document your independent review of radiology images, and any outside records:1} {Document your discussion with family members, caretakers, and with consultants:1} {Document social  determinants of health affecting pt's care:1} {Document your decision making why or why not admission, treatments were needed:1} Final Clinical Impression(s) / ED Diagnoses Final diagnoses:  None    Rx / DC Orders ED Discharge Orders     None

## 2022-05-10 NOTE — ED Notes (Signed)
Pt request for labs to be collected once inside room in back

## 2022-05-10 NOTE — Discharge Instructions (Addendum)
The suicide prevention education provided includes the following: Suicide risk factors Suicide prevention and interventions National Suicide Hotline telephone number [facility] assessment telephone number [facility] Emergency Assistance [place] and/or Residential Mobile Crisis Unit telephone number   Request made of family/significant other to: Remove weapons (e.g., guns, rifles, knives), all items previously/currently identified as safety concern.   Remove drugs/medications (over the counter, prescriptions, illicit drugs), all items previously/currently identified as a safety concern.    Patient is instructed prior to discharge to: Take all medications as prescribed by his/her mental healthcare provider. Report any adverse effects and or reactions from the medicines to his/her outpatient provider promptly. Patient has been instructed & cautioned: To not engage in alcohol and or illegal drug use while on prescription medicines. In the event of worsening symptoms, patient is instructed to call the crisis hotline, 911 and or go to the nearest ED for appropriate evaluation and treatment of symptoms. To follow-up with his/her primary care provider for your other medical issues, concerns and or health care needs.  You are encouraged to follow up with [facility] for outpatient treatment. Walk in/ Open Access Hours: Monday, Wednesday, Thursday or Friday 8AM - 11AM (To see provider and therapist  [facility] [address] [place], [place]

## 2022-05-10 NOTE — ED Notes (Signed)
Pt given AVS and follow up instructions.  Verbalized understanding on where to pick up medications and follow up.  No distress noted.

## 2022-05-11 MED ORDER — POTASSIUM CHLORIDE CRYS ER 20 MEQ PO TBCR
40.0000 meq | EXTENDED_RELEASE_TABLET | Freq: Once | ORAL | Status: AC
Start: 2022-05-11 — End: 2022-05-11
  Administered 2022-05-11: 40 meq via ORAL
  Filled 2022-05-11: qty 2

## 2022-05-11 NOTE — Discharge Instructions (Signed)
Follow up with the  Sickle cell clinic.

## 2022-05-14 ENCOUNTER — Telehealth: Payer: Self-pay

## 2022-05-14 NOTE — Telephone Encounter (Signed)
Patient called requesting refill on Oxycodone and Zofran. Sent to [facility] on [address].

## 2022-05-19 ENCOUNTER — Other Ambulatory Visit: Payer: Self-pay | Admitting: Nurse Practitioner

## 2022-05-19 ENCOUNTER — Telehealth: Payer: Self-pay | Admitting: Nurse Practitioner

## 2022-05-19 DIAGNOSIS — G894 Chronic pain syndrome: Secondary | ICD-10-CM

## 2022-05-19 DIAGNOSIS — R110 Nausea: Secondary | ICD-10-CM

## 2022-05-19 DIAGNOSIS — D5720 Sickle-cell/Hb-C disease without crisis: Secondary | ICD-10-CM

## 2022-05-19 MED ORDER — ONDANSETRON HCL 8 MG PO TABS: 8.0000 mg | ORAL_TABLET | Freq: Three times a day (TID) | ORAL | 0 refills | Status: DC | PRN

## 2022-05-19 MED ORDER — OXYCODONE HCL 15 MG PO TABS: 15.0000 mg | ORAL_TABLET | Freq: Four times a day (QID) | ORAL | 0 refills | Status: DC | PRN

## 2022-05-19 NOTE — Telephone Encounter (Signed)
Patient called requesting refill on Oxycodone and Zofran. Sent to [facility] on [address].

## 2022-05-28 ENCOUNTER — Other Ambulatory Visit: Payer: Self-pay | Admitting: Nurse Practitioner

## 2022-05-28 DIAGNOSIS — G894 Chronic pain syndrome: Secondary | ICD-10-CM

## 2022-05-28 MED ORDER — LIDOCAINE 4 % EX PTCH: 1.0000 | MEDICATED_PATCH | CUTANEOUS | 0 refills | Status: DC

## 2022-06-04 ENCOUNTER — Other Ambulatory Visit: Payer: Self-pay

## 2022-06-04 ENCOUNTER — Other Ambulatory Visit: Payer: Self-pay | Admitting: Nurse Practitioner

## 2022-06-04 ENCOUNTER — Telehealth: Payer: Self-pay | Admitting: Nurse Practitioner

## 2022-06-04 DIAGNOSIS — D5720 Sickle-cell/Hb-C disease without crisis: Secondary | ICD-10-CM

## 2022-06-04 DIAGNOSIS — G894 Chronic pain syndrome: Secondary | ICD-10-CM

## 2022-06-04 MED ORDER — OXYCODONE HCL 15 MG PO TABS: 15.0000 mg | ORAL_TABLET | Freq: Four times a day (QID) | ORAL | 0 refills | Status: DC | PRN

## 2022-06-04 NOTE — Telephone Encounter (Signed)
Oxycodone refill request.

## 2022-06-07 ENCOUNTER — Other Ambulatory Visit: Payer: Self-pay

## 2022-06-07 DIAGNOSIS — D5720 Sickle-cell/Hb-C disease without crisis: Secondary | ICD-10-CM

## 2022-06-07 DIAGNOSIS — G894 Chronic pain syndrome: Secondary | ICD-10-CM

## 2022-06-07 NOTE — Telephone Encounter (Signed)
Rx refill request was sent to the provider for review ?

## 2022-06-08 MED ORDER — OXYCODONE HCL 15 MG PO TABS: 15.0000 mg | ORAL_TABLET | Freq: Four times a day (QID) | ORAL | 0 refills | Status: AC | PRN

## 2022-06-18 ENCOUNTER — Ambulatory Visit: Payer: Self-pay | Admitting: Nurse Practitioner

## 2022-06-23 ENCOUNTER — Ambulatory Visit: Payer: Self-pay | Admitting: Nurse Practitioner

## 2022-06-25 ENCOUNTER — Encounter: Payer: Self-pay | Admitting: Nurse Practitioner

## 2022-06-25 ENCOUNTER — Ambulatory Visit (INDEPENDENT_AMBULATORY_CARE_PROVIDER_SITE_OTHER): Payer: Medicaid Other | Admitting: Nurse Practitioner

## 2022-06-25 VITALS — Wt 130.0 lb

## 2022-06-25 DIAGNOSIS — D5720 Sickle-cell/Hb-C disease without crisis: Secondary | ICD-10-CM | POA: Diagnosis not present

## 2022-06-25 MED ORDER — IBUPROFEN 800 MG PO TABS: 800.0000 mg | ORAL_TABLET | Freq: Every day | ORAL | 0 refills | Status: AC | PRN

## 2022-06-25 NOTE — Progress Notes (Signed)
Virtual Visit via Telephone Note  I connected with [name] on 06/25/22 at  8:40 AM EDT by telephone and verified that I am speaking with the correct person using two identifiers.  Location: Patient: home Provider: office   I discussed the limitations, risks, security and privacy concerns of performing an evaluation and management service by telephone and the availability of in person appointments. I also discussed with the patient that there may be a patient responsible charge related to this service. The patient expressed understanding and agreed to proceed.   History of Present Illness:  Hannah Hawkins 36 y.o. female  has a past medical history of Sickle cell anemia (HCC). To the [facility] for missed menstrual cycle and vaginal discomfort x 1 wk.  Patient presents today for follow-up visit.  Patient does have a history of sickle cell.  She states that everything has been going well.  She does need her ibuprofen refilled. Denies f/c/s, n/v/d, hemoptysis, PND, leg swelling Denies chest pain or edema     Observations/Objective:     06/25/2022    8:56 AM 05/11/2022   12:00 AM 05/10/2022   11:40 PM  Vitals with BMI  Weight 130 lbs    Systolic  98 102  Diastolic  68 70  Pulse  68 78      Assessment and Plan:  1. Sickle cell-hemoglobin C disease without crisis (HCC)   -Discussed need for good hydration  -Monitor hydration status  -Avoid heat, cold, stress, and infectious triggers  -Discussed the importance of adherence to medication regimen  -Please seek medical attention immediately if any symptoms of bleeding, anemia, infection occurs   Follow up:  Follow up in 3 months    I discussed the assessment and treatment plan with the patient. The patient was provided an opportunity to ask questions and all were answered. The patient agreed with the plan and demonstrated an understanding of the instructions.   The patient was advised to call back or seek an in-person  evaluation if the symptoms worsen or if the condition fails to improve as anticipated.  I provided 23 minutes of non-face-to-face time during this encounter.   Ivonne Andrew, NP

## 2022-06-25 NOTE — Patient Instructions (Signed)
1. Sickle cell-hemoglobin C disease without crisis (HCC)   -Discussed need for good hydration  -Monitor hydration status  -Avoid heat, cold, stress, and infectious triggers  -Discussed the importance of adherence to medication regimen  -Please seek medical attention immediately if any symptoms of bleeding, anemia, infection occurs   Follow up:  Follow up in 3 months

## 2022-06-29 ENCOUNTER — Telehealth: Payer: Self-pay | Admitting: Nurse Practitioner

## 2022-06-29 ENCOUNTER — Other Ambulatory Visit: Payer: Self-pay | Admitting: Family Medicine

## 2022-06-29 DIAGNOSIS — G894 Chronic pain syndrome: Secondary | ICD-10-CM

## 2022-06-29 DIAGNOSIS — D5720 Sickle-cell/Hb-C disease without crisis: Secondary | ICD-10-CM

## 2022-06-29 MED ORDER — OXYCODONE HCL 15 MG PO TABS: 15.0000 mg | ORAL_TABLET | Freq: Four times a day (QID) | ORAL | 0 refills | Status: DC | PRN

## 2022-06-29 NOTE — Progress Notes (Signed)
Reviewed PDMP substance reporting system prior to prescribing opiate medications. No inconsistencies noted.  Meds ordered this encounter  Medications   oxyCODONE (ROXICODONE) 15 MG immediate release tablet    Sig: Take 1 tablet (15 mg total) by mouth every 6 (six) hours as needed for pain.    Dispense:  60 tablet    Refill:  0    Order Specific Question:   Supervising Provider    Answer:   JEGEDE, [name] [[id number]]   Hannah Hawkins Hannah Levay  APRN, MSN, FNP-C [facility] [address]  [place], [place] [address] [phone]  

## 2022-06-29 NOTE — Telephone Encounter (Signed)
Oxycodone refill request.

## 2022-07-05 ENCOUNTER — Other Ambulatory Visit: Payer: Self-pay

## 2022-07-05 DIAGNOSIS — G894 Chronic pain syndrome: Secondary | ICD-10-CM

## 2022-07-05 MED ORDER — LIDOCAINE 4 % EX PTCH: 1.0000 | MEDICATED_PATCH | CUTANEOUS | 0 refills | Status: DC

## 2022-07-13 ENCOUNTER — Telehealth: Payer: Self-pay

## 2022-07-13 NOTE — Telephone Encounter (Signed)
Oxycodone  °

## 2022-07-21 ENCOUNTER — Ambulatory Visit: Payer: Medicaid Other | Admitting: Family Medicine

## 2022-07-21 ENCOUNTER — Other Ambulatory Visit: Payer: Self-pay | Admitting: Family Medicine

## 2022-07-21 DIAGNOSIS — G894 Chronic pain syndrome: Secondary | ICD-10-CM

## 2022-07-21 NOTE — Progress Notes (Signed)
Orders Placed This Encounter  Procedures   [id number]+Oxyco+Alc+Crt-Bund     Io Dieujuste Moore Zymire Turnbo  APRN, MSN, FNP-C [facility] [address]  [place], [place] [address] [phone]  

## 2022-07-22 LAB — DRUG SCREEN 764883 11+OXYCO+ALC+CRT-BUND
Amphetamines, Urine: NEGATIVE ng/mL
BENZODIAZ UR QL: NEGATIVE ng/mL
Barbiturate: NEGATIVE ng/mL
Cannabinoid Quant, Ur: NEGATIVE ng/mL
Cocaine (Metabolite): NEGATIVE ng/mL
Creatinine: 268.1 mg/dL (ref 20.0–300.0)
Ethanol: NEGATIVE %
Meperidine: NEGATIVE ng/mL
Methadone Screen, Urine: NEGATIVE ng/mL
OPIATE SCREEN URINE: NEGATIVE ng/mL
Oxycodone/Oxymorphone, Urine: NEGATIVE ng/mL
Phencyclidine: NEGATIVE ng/mL
Propoxyphene: NEGATIVE ng/mL
Tramadol: NEGATIVE ng/mL
pH, Urine: 6.2 (ref 4.5–8.9)

## 2022-07-23 ENCOUNTER — Telehealth: Payer: Self-pay | Admitting: Nurse Practitioner

## 2022-07-23 NOTE — Telephone Encounter (Signed)
Oxycodone refill request.

## 2022-07-27 ENCOUNTER — Other Ambulatory Visit: Payer: Self-pay | Admitting: Internal Medicine

## 2022-07-27 DIAGNOSIS — G894 Chronic pain syndrome: Secondary | ICD-10-CM

## 2022-07-27 DIAGNOSIS — D5720 Sickle-cell/Hb-C disease without crisis: Secondary | ICD-10-CM

## 2022-07-27 MED ORDER — OXYCODONE HCL 15 MG PO TABS: 15.0000 mg | ORAL_TABLET | Freq: Four times a day (QID) | ORAL | 0 refills | Status: DC | PRN

## 2022-08-11 ENCOUNTER — Other Ambulatory Visit: Payer: Self-pay | Admitting: Nurse Practitioner

## 2022-08-11 ENCOUNTER — Telehealth: Payer: Self-pay

## 2022-08-11 DIAGNOSIS — D5720 Sickle-cell/Hb-C disease without crisis: Secondary | ICD-10-CM

## 2022-08-11 DIAGNOSIS — G894 Chronic pain syndrome: Secondary | ICD-10-CM

## 2022-08-11 MED ORDER — OXYCODONE HCL 15 MG PO TABS: 15.0000 mg | ORAL_TABLET | Freq: Four times a day (QID) | ORAL | 0 refills | Status: DC | PRN

## 2022-08-11 NOTE — Telephone Encounter (Signed)
Oxycodone  °

## 2022-08-11 NOTE — Progress Notes (Signed)
PDMP reviewed.

## 2022-08-11 NOTE — Telephone Encounter (Signed)
Medication refilled - will need appointment before next refill.

## 2022-08-12 ENCOUNTER — Other Ambulatory Visit: Payer: Self-pay | Admitting: Internal Medicine

## 2022-08-13 ENCOUNTER — Other Ambulatory Visit: Payer: Self-pay | Admitting: Internal Medicine

## 2022-08-25 ENCOUNTER — Telehealth: Payer: Self-pay

## 2022-08-25 NOTE — Telephone Encounter (Signed)
Oxycodone  °

## 2022-08-26 ENCOUNTER — Other Ambulatory Visit: Payer: Self-pay | Admitting: Nurse Practitioner

## 2022-08-26 DIAGNOSIS — G894 Chronic pain syndrome: Secondary | ICD-10-CM

## 2022-08-26 DIAGNOSIS — D5720 Sickle-cell/Hb-C disease without crisis: Secondary | ICD-10-CM

## 2022-08-26 MED ORDER — OXYCODONE HCL 15 MG PO TABS: 15.0000 mg | ORAL_TABLET | Freq: Four times a day (QID) | ORAL | 0 refills | Status: DC | PRN

## 2022-08-26 NOTE — Telephone Encounter (Signed)
Refill sent to pharmacy - patient will need an appointment in 2 weeks before next refill. Thanks.

## 2022-09-08 ENCOUNTER — Telehealth: Payer: Self-pay

## 2022-09-08 NOTE — Telephone Encounter (Signed)
Oxycodone  °

## 2022-09-09 ENCOUNTER — Other Ambulatory Visit: Payer: Self-pay | Admitting: Nurse Practitioner

## 2022-09-09 DIAGNOSIS — D5720 Sickle-cell/Hb-C disease without crisis: Secondary | ICD-10-CM

## 2022-09-09 DIAGNOSIS — G894 Chronic pain syndrome: Secondary | ICD-10-CM

## 2022-09-09 MED ORDER — OXYCODONE HCL 15 MG PO TABS: 15.0000 mg | ORAL_TABLET | Freq: Four times a day (QID) | ORAL | 0 refills | Status: DC | PRN

## 2022-09-14 ENCOUNTER — Telehealth: Payer: Self-pay | Admitting: Clinical

## 2022-09-14 NOTE — Telephone Encounter (Signed)
[facility] General Follow Up Note  09/14/2022 Name: Laneka Mcgrory MRN: [id number] DOB: Oct 04, 1986 Jara Feider is a 36 y.o. year old female who sees Fenton Foy, NP for primary care.  Interpreter: No.   Interpreter Name & Language: none  Assessment: Patient experiencing unemployment.   Ongoing Intervention: Today patient called CSW and requested assistance with employment. CSW referred patient to [facility] and also provided information to patient on [employer] and [employer].   Review of patient status, including review of consultants reports, relevant laboratory and other test results, and collaboration with appropriate care team members and the patient's provider was performed as part of comprehensive patient evaluation and provision of services.    Estanislado Emms, [employer] [phone]

## 2022-09-15 ENCOUNTER — Emergency Department (HOSPITAL_COMMUNITY)
Admission: EM | Admit: 2022-09-15 | Discharge: 2022-09-15 | Disposition: A | Discharge status: Home / Self Care | Payer: Medicaid Other | Attending: Emergency Medicine | Admitting: Emergency Medicine

## 2022-09-15 ENCOUNTER — Emergency Department (HOSPITAL_COMMUNITY): Payer: Medicaid Other

## 2022-09-15 ENCOUNTER — Other Ambulatory Visit: Payer: Self-pay

## 2022-09-15 DIAGNOSIS — M542 Cervicalgia: Secondary | ICD-10-CM | POA: Insufficient documentation

## 2022-09-15 DIAGNOSIS — M545 Low back pain, unspecified: Secondary | ICD-10-CM | POA: Diagnosis not present

## 2022-09-15 DIAGNOSIS — M546 Pain in thoracic spine: Secondary | ICD-10-CM | POA: Insufficient documentation

## 2022-09-15 DIAGNOSIS — Y92410 Unspecified street and highway as the place of occurrence of the external cause: Secondary | ICD-10-CM | POA: Diagnosis not present

## 2022-09-15 LAB — I-STAT BETA HCG BLOOD, ED (MC, WL, AP ONLY): I-stat hCG, quantitative: <5 m[IU]/mL (ref <5)

## 2022-09-15 MED ORDER — HYDROCODONE-ACETAMINOPHEN 5-325 MG PO TABS
2.0000 | ORAL_TABLET | Freq: Once | ORAL | Status: AC
  Administered 2022-09-15: 2 via ORAL
  Filled 2022-09-15: qty 2

## 2022-09-15 NOTE — ED Notes (Signed)
Joanette Gula PA-C at the bedside to examine pt, pt reports neck pain 10/10, meds to be order, refer to [facility]
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
## 2022-09-15 NOTE — ED Notes (Signed)
Patient has a lavender and light green in the main lab
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
## 2022-09-22 ENCOUNTER — Ambulatory Visit (INDEPENDENT_AMBULATORY_CARE_PROVIDER_SITE_OTHER): Payer: Medicaid Other | Admitting: Nurse Practitioner

## 2022-09-22 ENCOUNTER — Telehealth: Payer: Self-pay | Admitting: Nurse Practitioner

## 2022-09-22 ENCOUNTER — Encounter: Payer: Self-pay | Admitting: Nurse Practitioner

## 2022-09-22 VITALS — BP 118/74 | HR 95 | Temp 97.9°F | Ht 63.0 in | Wt 128.0 lb

## 2022-09-22 DIAGNOSIS — D5720 Sickle-cell/Hb-C disease without crisis: Secondary | ICD-10-CM | POA: Diagnosis not present

## 2022-09-22 DIAGNOSIS — G894 Chronic pain syndrome: Secondary | ICD-10-CM

## 2022-09-22 MED ORDER — OXYCODONE HCL 15 MG PO TABS: 15.0000 mg | ORAL_TABLET | Freq: Four times a day (QID) | ORAL | 0 refills | Status: DC | PRN

## 2022-09-22 MED ORDER — LIDOCAINE 4 % EX PTCH: 1.0000 | MEDICATED_PATCH | CUTANEOUS | 0 refills | Status: DC

## 2022-09-22 NOTE — Assessment & Plan Note (Signed)
-   [id number] 11+Oxyco+Alc+Crt-Bund - Sickle Cell Panel  2. Chronic pain syndrome  - lidocaine (HM LIDOCAINE PATCH) 4 %; Place 1 patch onto the skin daily.  Dispense: 15 patch; Refill: 0   Follow up:  Follow up in 3 months or sooner if needed
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
## 2022-09-22 NOTE — Telephone Encounter (Signed)
Error

## 2022-09-22 NOTE — Patient Instructions (Addendum)
1. 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 Chronic pain syndrome  - lidocaine (HM LIDOCAINE PATCH) 4 %; Place 1 patch onto the skin daily.  Dispense: 15 patch; Refill: 0   Follow up:  Follow up in 3 months or sooner if needed

## 2022-09-23 LAB — CMP14+CBC/D/PLT+FER+RETIC+V...
ALT: 9 IU/L (ref 0–32)
AST: 19 IU/L (ref 0–40)
Albumin/Globulin Ratio: 1.8 (ref 1.2–2.2)
Albumin: 3.9 g/dL (ref 3.9–4.9)
Alkaline Phosphatase: 49 IU/L (ref 44–121)
BUN/Creatinine Ratio: 8 — ABNORMAL LOW (ref 9–23)
BUN: 5 mg/dL — ABNORMAL LOW (ref 6–20)
Basophils Absolute: 0.1 10*3/uL (ref 0.0–0.2)
Basos: 1 %
Bilirubin Total: 0.5 mg/dL (ref 0.0–1.2)
CO2: 23 mmol/L (ref 20–29)
Calcium: 9 mg/dL (ref 8.7–10.2)
Chloride: 103 mmol/L (ref 96–106)
Creatinine, Ser: 0.65 mg/dL (ref 0.57–1.00)
EOS (ABSOLUTE): 0.4 10*3/uL (ref 0.0–0.4)
Eos: 4 %
Ferritin: 123 ng/mL (ref 15–150)
Globulin, Total: 2.2 g/dL (ref 1.5–4.5)
Glucose: 87 mg/dL (ref 70–99)
Hematocrit: 32.8 % — ABNORMAL LOW (ref 34.0–46.6)
Hemoglobin: 11 g/dL — ABNORMAL LOW (ref 11.1–15.9)
Immature Grans (Abs): 0 10*3/uL (ref 0.0–0.1)
Immature Granulocytes: 0 %
Lymphocytes Absolute: 2.2 10*3/uL (ref 0.7–3.1)
Lymphs: 24 %
MCH: 28.1 pg (ref 26.6–33.0)
MCHC: 33.5 g/dL (ref 31.5–35.7)
MCV: 84 fL (ref 79–97)
Monocytes Absolute: 0.8 10*3/uL (ref 0.1–0.9)
Monocytes: 9 %
NRBC: 3 % — ABNORMAL HIGH (ref 0–0)
Neutrophils Absolute: 5.5 10*3/uL (ref 1.4–7.0)
Neutrophils: 62 %
Platelets: 329 10*3/uL (ref 150–450)
Potassium: 4.2 mmol/L (ref 3.5–5.2)
RBC: 3.91 x10E6/uL (ref 3.77–5.28)
RDW: 14 % (ref 11.7–15.4)
Retic Ct Pct: 3.5 % — ABNORMAL HIGH (ref 0.6–2.6)
Sodium: 139 mmol/L (ref 134–144)
Total Protein: 6.1 g/dL (ref 6.0–8.5)
Vit D, 25-Hydroxy: 20.8 ng/mL — ABNORMAL LOW (ref 30.0–100.0)
WBC: 9 10*3/uL (ref 3.4–10.8)
eGFR: 115 mL/min/{1.73_m2} (ref >59)

## 2022-09-27 LAB — DRUG SCREEN 764883 11+OXYCO+ALC+CRT-BUND
Amphetamines, Urine: NEGATIVE ng/mL
BENZODIAZ UR QL: NEGATIVE ng/mL
Barbiturate: NEGATIVE ng/mL
Cannabinoid Quant, Ur: NEGATIVE ng/mL
Cocaine (Metabolite): NEGATIVE ng/mL
Creatinine: 46.3 mg/dL (ref 20.0–300.0)
Ethanol: NEGATIVE %
Meperidine: NEGATIVE ng/mL
Methadone Screen, Urine: NEGATIVE ng/mL
OPIATE SCREEN URINE: NEGATIVE ng/mL
Phencyclidine: NEGATIVE ng/mL
Propoxyphene: NEGATIVE ng/mL
Tramadol: NEGATIVE ng/mL
pH, Urine: 7.1 (ref 4.5–8.9)

## 2022-09-27 LAB — OXYCODONE/OXYMORPHONE, CONFIRM
OXYCODONE/OXYMORPH: POSITIVE — AB
OXYCODONE: NEGATIVE
OXYMORPHONE (GC/MS): 806 ng/mL
OXYMORPHONE: POSITIVE — AB

## 2022-10-04 ENCOUNTER — Inpatient Hospital Stay (HOSPITAL_COMMUNITY)
Admission: EM | Admit: 2022-10-04 | Discharge: 2022-10-07 | DRG: 812 | Disposition: A | Discharge status: Home / Self Care | Payer: Medicaid Other | Attending: Internal Medicine | Admitting: Internal Medicine

## 2022-10-04 ENCOUNTER — Other Ambulatory Visit: Payer: Self-pay | Admitting: Nurse Practitioner

## 2022-10-04 ENCOUNTER — Telehealth: Payer: Self-pay

## 2022-10-04 DIAGNOSIS — Z1152 Encounter for screening for COVID-19: Secondary | ICD-10-CM

## 2022-10-04 DIAGNOSIS — Z79899 Other long term (current) drug therapy: Secondary | ICD-10-CM

## 2022-10-04 DIAGNOSIS — D57219 Sickle-cell/Hb-C disease with crisis, unspecified: Principal | ICD-10-CM | POA: Diagnosis present

## 2022-10-04 DIAGNOSIS — G894 Chronic pain syndrome: Secondary | ICD-10-CM | POA: Diagnosis present

## 2022-10-04 DIAGNOSIS — D5700 Hb-SS disease with crisis, unspecified: Principal | ICD-10-CM | POA: Diagnosis present

## 2022-10-04 DIAGNOSIS — Z91138 Patient's unintentional underdosing of medication regimen for other reason: Secondary | ICD-10-CM

## 2022-10-04 DIAGNOSIS — D638 Anemia in other chronic diseases classified elsewhere: Secondary | ICD-10-CM | POA: Diagnosis present

## 2022-10-04 DIAGNOSIS — T402X6A Underdosing of other opioids, initial encounter: Secondary | ICD-10-CM | POA: Diagnosis present

## 2022-10-04 DIAGNOSIS — Z79891 Long term (current) use of opiate analgesic: Secondary | ICD-10-CM

## 2022-10-04 DIAGNOSIS — F1410 Cocaine abuse, uncomplicated: Secondary | ICD-10-CM | POA: Diagnosis present

## 2022-10-04 DIAGNOSIS — Z87891 Personal history of nicotine dependence: Secondary | ICD-10-CM

## 2022-10-04 DIAGNOSIS — F1491 Cocaine use, unspecified, in remission: Secondary | ICD-10-CM | POA: Diagnosis present

## 2022-10-04 DIAGNOSIS — Z833 Family history of diabetes mellitus: Secondary | ICD-10-CM

## 2022-10-04 NOTE — Telephone Encounter (Signed)
Oxycodone  °

## 2022-10-05 ENCOUNTER — Encounter (HOSPITAL_COMMUNITY): Payer: Self-pay

## 2022-10-05 ENCOUNTER — Other Ambulatory Visit: Payer: Self-pay

## 2022-10-05 DIAGNOSIS — Z79891 Long term (current) use of opiate analgesic: Secondary | ICD-10-CM | POA: Diagnosis not present

## 2022-10-05 DIAGNOSIS — Z1152 Encounter for screening for COVID-19: Secondary | ICD-10-CM | POA: Diagnosis not present

## 2022-10-05 DIAGNOSIS — D638 Anemia in other chronic diseases classified elsewhere: Secondary | ICD-10-CM | POA: Diagnosis present

## 2022-10-05 DIAGNOSIS — Z833 Family history of diabetes mellitus: Secondary | ICD-10-CM | POA: Diagnosis not present

## 2022-10-05 DIAGNOSIS — Z79899 Other long term (current) drug therapy: Secondary | ICD-10-CM | POA: Diagnosis not present

## 2022-10-05 DIAGNOSIS — T402X6A Underdosing of other opioids, initial encounter: Secondary | ICD-10-CM | POA: Diagnosis present

## 2022-10-05 DIAGNOSIS — F1410 Cocaine abuse, uncomplicated: Secondary | ICD-10-CM | POA: Diagnosis present

## 2022-10-05 DIAGNOSIS — D5700 Hb-SS disease with crisis, unspecified: Secondary | ICD-10-CM

## 2022-10-05 DIAGNOSIS — G894 Chronic pain syndrome: Secondary | ICD-10-CM | POA: Diagnosis present

## 2022-10-05 DIAGNOSIS — Z91138 Patient's unintentional underdosing of medication regimen for other reason: Secondary | ICD-10-CM | POA: Diagnosis not present

## 2022-10-05 DIAGNOSIS — D57219 Sickle-cell/Hb-C disease with crisis, unspecified: Secondary | ICD-10-CM | POA: Diagnosis present

## 2022-10-05 DIAGNOSIS — Z87891 Personal history of nicotine dependence: Secondary | ICD-10-CM | POA: Diagnosis not present

## 2022-10-05 LAB — CBC WITH DIFFERENTIAL/PLATELET
Abs Immature Granulocytes: 0.02 10*3/uL (ref 0.00–0.07)
Basophils Absolute: 0.1 10*3/uL (ref 0.0–0.1)
Basophils Relative: 1 %
Eosinophils Absolute: 1.3 10*3/uL — ABNORMAL HIGH (ref 0.0–0.5)
Eosinophils Relative: 14 %
HCT: 30.3 % — ABNORMAL LOW (ref 36.0–46.0)
Hemoglobin: 11.1 g/dL — ABNORMAL LOW (ref 12.0–15.0)
Immature Granulocytes: 0 %
Lymphocytes Relative: 32 %
Lymphs Abs: 2.8 10*3/uL (ref 0.7–4.0)
MCH: 27.3 pg (ref 26.0–34.0)
MCHC: 36.6 g/dL — ABNORMAL HIGH (ref 30.0–36.0)
MCV: 74.4 fL — ABNORMAL LOW (ref 80.0–100.0)
Monocytes Absolute: 0.8 10*3/uL (ref 0.1–1.0)
Monocytes Relative: 9 %
Neutro Abs: 3.9 10*3/uL (ref 1.7–7.7)
Neutrophils Relative %: 44 %
Platelets: 447 10*3/uL — ABNORMAL HIGH (ref 150–400)
RBC: 4.07 MIL/uL (ref 3.87–5.11)
RDW: 12.5 % (ref 11.5–15.5)
WBC: 8.8 10*3/uL (ref 4.0–10.5)
nRBC: 2 % — ABNORMAL HIGH (ref 0.0–0.2)

## 2022-10-05 LAB — COMPREHENSIVE METABOLIC PANEL
ALT: 17 U/L (ref 0–44)
AST: 18 U/L (ref 15–41)
Albumin: 4.1 g/dL (ref 3.5–5.0)
Alkaline Phosphatase: 44 U/L (ref 38–126)
Anion gap: 5 (ref 5–15)
BUN: 10 mg/dL (ref 6–20)
CO2: 26 mmol/L (ref 22–32)
Calcium: 8.9 mg/dL (ref 8.9–10.3)
Chloride: 108 mmol/L (ref 98–111)
Creatinine, Ser: 0.65 mg/dL (ref 0.44–1.00)
GFR, Estimated: >60 mL/min (ref >60)
Glucose, Bld: 82 mg/dL (ref 70–99)
Potassium: 3.5 mmol/L (ref 3.5–5.1)
Sodium: 139 mmol/L (ref 135–145)
Total Bilirubin: 1.1 mg/dL (ref 0.3–1.2)
Total Protein: 7.4 g/dL (ref 6.5–8.1)

## 2022-10-05 LAB — SARS CORONAVIRUS 2 BY RT PCR: SARS Coronavirus 2 by RT PCR: NEGATIVE

## 2022-10-05 LAB — RETICULOCYTES
Immature Retic Fract: 15.2 % (ref 2.3–15.9)
RBC.: 4.06 MIL/uL (ref 3.87–5.11)
Retic Count, Absolute: 117.3 10*3/uL (ref 19.0–186.0)
Retic Ct Pct: 2.9 % (ref 0.4–3.1)

## 2022-10-05 LAB — I-STAT BETA HCG BLOOD, ED (MC, WL, AP ONLY): I-stat hCG, quantitative: <5 m[IU]/mL (ref <5)

## 2022-10-05 MED ORDER — LIP MEDEX EX OINT
TOPICAL_OINTMENT | CUTANEOUS | Status: DC | PRN
  Filled 2022-10-05: qty 7

## 2022-10-05 MED ORDER — HYDROMORPHONE HCL 1 MG/ML IJ SOLN
1.0000 mg | Freq: Once | INTRAMUSCULAR | Status: AC
  Administered 2022-10-05: 1 mg via INTRAVENOUS
  Filled 2022-10-05: qty 1

## 2022-10-05 MED ORDER — SODIUM CHLORIDE 0.9% FLUSH: 9.0000 mL | INTRAVENOUS | Status: DC | PRN

## 2022-10-05 MED ORDER — SODIUM CHLORIDE 0.9 % IV BOLUS
1000.0000 mL | Freq: Once | INTRAVENOUS | Status: AC
  Administered 2022-10-05: 1000 mL via INTRAVENOUS

## 2022-10-05 MED ORDER — LIDOCAINE 5 % EX PTCH
1.0000 | MEDICATED_PATCH | CUTANEOUS | Status: AC
  Administered 2022-10-05: 1 via TRANSDERMAL
  Filled 2022-10-05 (×2): qty 1

## 2022-10-05 MED ORDER — OXYCODONE HCL 5 MG PO TABS
15.0000 mg | ORAL_TABLET | ORAL | Status: DC | PRN
  Administered 2022-10-05 – 2022-10-07 (×5): 15 mg via ORAL
  Filled 2022-10-05 (×6): qty 3

## 2022-10-05 MED ORDER — KETOROLAC TROMETHAMINE 30 MG/ML IJ SOLN
15.0000 mg | Freq: Four times a day (QID) | INTRAMUSCULAR | Status: DC
  Administered 2022-10-05: 15 mg via INTRAVENOUS
  Filled 2022-10-05: qty 1

## 2022-10-05 MED ORDER — HYDROMORPHONE HCL 1 MG/ML IJ SOLN
1.0000 mg | INTRAMUSCULAR | Status: DC | PRN
  Administered 2022-10-05: 1 mg via INTRAVENOUS
  Filled 2022-10-05: qty 1

## 2022-10-05 MED ORDER — FOLIC ACID 1 MG PO TABS: 1.0000 mg | ORAL_TABLET | Freq: Every day | ORAL | Status: DC

## 2022-10-05 MED ORDER — SODIUM CHLORIDE 0.45 % IV SOLN: INTRAVENOUS | Status: DC

## 2022-10-05 MED ORDER — KETOROLAC TROMETHAMINE 15 MG/ML IJ SOLN
15.0000 mg | Freq: Once | INTRAMUSCULAR | Status: AC
  Administered 2022-10-05: 15 mg via INTRAVENOUS
  Filled 2022-10-05: qty 1

## 2022-10-05 MED ORDER — HYDROMORPHONE 1 MG/ML IV SOLN
INTRAVENOUS | Status: DC
  Administered 2022-10-05: 2 mg via INTRAVENOUS
  Administered 2022-10-05: 3.5 mg via INTRAVENOUS
  Administered 2022-10-06: 3 mg via INTRAVENOUS
  Administered 2022-10-06: 2.5 mg via INTRAVENOUS
  Administered 2022-10-06: 3 mg via INTRAVENOUS
  Administered 2022-10-06: 2 mg via INTRAVENOUS
  Filled 2022-10-05: qty 30

## 2022-10-05 MED ORDER — ONDANSETRON HCL 4 MG/2ML IJ SOLN
4.0000 mg | Freq: Four times a day (QID) | INTRAMUSCULAR | Status: DC | PRN
  Administered 2022-10-06: 4 mg via INTRAVENOUS
  Filled 2022-10-05: qty 2

## 2022-10-05 MED ORDER — ENOXAPARIN SODIUM 40 MG/0.4ML IJ SOSY
40.0000 mg | PREFILLED_SYRINGE | INTRAMUSCULAR | Status: DC
  Filled 2022-10-05 (×3): qty 0.4

## 2022-10-05 MED ORDER — KETOROLAC TROMETHAMINE 15 MG/ML IJ SOLN
15.0000 mg | Freq: Four times a day (QID) | INTRAMUSCULAR | Status: DC
  Administered 2022-10-05 – 2022-10-07 (×8): 15 mg via INTRAVENOUS
  Filled 2022-10-05 (×8): qty 1

## 2022-10-05 MED ORDER — DIPHENHYDRAMINE HCL 25 MG PO CAPS
25.0000 mg | ORAL_CAPSULE | ORAL | Status: DC | PRN
  Administered 2022-10-05: 25 mg via ORAL
  Filled 2022-10-05: qty 1

## 2022-10-05 MED ORDER — FOLIC ACID 1 MG PO TABS
1.0000 mg | ORAL_TABLET | Freq: Every day | ORAL | Status: DC
  Administered 2022-10-05 – 2022-10-07 (×3): 1 mg via ORAL
  Filled 2022-10-05 (×3): qty 1

## 2022-10-05 MED ORDER — DIPHENHYDRAMINE HCL 50 MG/ML IJ SOLN
12.5000 mg | Freq: Once | INTRAMUSCULAR | Status: AC
  Administered 2022-10-05: 12.5 mg via INTRAVENOUS
  Filled 2022-10-05: qty 1

## 2022-10-05 MED ORDER — MONTELUKAST SODIUM 10 MG PO TABS
10.0000 mg | ORAL_TABLET | Freq: Every day | ORAL | Status: DC
  Administered 2022-10-05 – 2022-10-06 (×2): 10 mg via ORAL
  Filled 2022-10-05 (×2): qty 1

## 2022-10-05 MED ORDER — POLYETHYLENE GLYCOL 3350 17 G PO PACK: 17.0000 g | PACK | Freq: Every day | ORAL | Status: DC | PRN

## 2022-10-05 MED ORDER — LIDOCAINE 5 % EX PTCH
1.0000 | MEDICATED_PATCH | CUTANEOUS | Status: AC
  Administered 2022-10-05: 1 via TRANSDERMAL
  Filled 2022-10-05: qty 1

## 2022-10-05 MED ORDER — SENNOSIDES-DOCUSATE SODIUM 8.6-50 MG PO TABS
1.0000 | ORAL_TABLET | Freq: Two times a day (BID) | ORAL | Status: DC
  Administered 2022-10-07: 1 via ORAL
  Filled 2022-10-05 (×3): qty 1

## 2022-10-05 MED ORDER — HYDROMORPHONE HCL 2 MG/ML IJ SOLN
2.0000 mg | Freq: Once | INTRAMUSCULAR | Status: AC
  Administered 2022-10-05: 2 mg via INTRAVENOUS
  Filled 2022-10-05: qty 1

## 2022-10-05 MED ORDER — ONDANSETRON HCL 4 MG/2ML IJ SOLN
4.0000 mg | Freq: Once | INTRAMUSCULAR | Status: AC
  Administered 2022-10-05: 4 mg via INTRAVENOUS
  Filled 2022-10-05: qty 2

## 2022-10-05 MED ORDER — RISPERIDONE 2 MG PO TABS
2.0000 mg | ORAL_TABLET | Freq: Every day | ORAL | Status: DC
  Filled 2022-10-05 (×3): qty 1

## 2022-10-05 MED ORDER — NALOXONE HCL 0.4 MG/ML IJ SOLN: 0.4000 mg | INTRAMUSCULAR | Status: DC | PRN

## 2022-10-05 MED ORDER — FLUTICASONE PROPIONATE 50 MCG/ACT NA SUSP: 2.0000 | Freq: Every day | NASAL | Status: DC

## 2022-10-05 NOTE — ED Triage Notes (Signed)
Pt comes via [facility] for sickle cell pain that has been going on for 2 days, ran out of home meds, generalized pain all over.

## 2022-10-05 NOTE — ED Notes (Signed)
Patient given soda, crackers and [place] sandwich.

## 2022-10-05 NOTE — ED Notes (Signed)
Patient provided with another soda, water, and another breakfast sandwhich per request.

## 2022-10-05 NOTE — ED Provider Notes (Signed)
[facility] DEPT Provider Note   CSN: [id number] Arrival date & time: 10/04/22  2353     History  Chief Complaint  Patient presents with   Sickle Cell Pain Crisis    Hannah Hawkins is a 36 y.o. female who presents with concern for sickle cell crisis.  Patient with oxycodone at home but states she ran out 3 days ago.  States that she has been having back pain and pain in the legs consistent with her sickle cell crises but typically can manage at home well with her oral meds until she ran out.  Has not required admission for long time.  Denies any chest pain shortness of breath, fevers, chills, vomiting or diarrhea.  LMP ended yesterday.  I personally read her medical records.  She has history of chronic pain syndrome, cocaine use, sickle cell anemia.  HPI     Home Medications Prior to Admission medications   Medication Sig Start Date End Date Taking? Authorizing Provider  lidocaine (HM LIDOCAINE PATCH) 4 % Place 1 patch onto the skin daily. 09/22/22  Yes Fenton Foy, NP  oxyCODONE (ROXICODONE) 15 MG immediate release tablet Take 1 tablet (15 mg total) by mouth every 6 (six) hours as needed for pain. 09/23/22  Yes Fenton Foy, NP  fluticasone (FLONASE) 50 MCG/ACT nasal spray Place 2 sprays into both nostrils daily. Patient not taking: Reported on 10/05/2022 08/14/21   Vevelyn Francois, NP  montelukast (SINGULAIR) 10 MG tablet Take 1 tablet (10 mg total) by mouth at bedtime. Patient not taking: Reported on 10/05/2022 08/14/21   Vevelyn Francois, NP  [facility] 4 MG/0.1ML LIQD nasal spray kit Place 4 mg into the nose once as needed (overdose). 12/22/17   [provider]  norethindrone (MICRONOR) 0.35 MG tablet Take 1 tablet (0.35 mg total) by mouth daily. Patient not taking: Reported on 10/05/2022 09/08/21   Vevelyn Francois, NP  ondansetron (ZOFRAN) 8 MG tablet Take 1 tablet (8 mg total) by mouth every 8 (eight) hours as needed for nausea or  vomiting. Patient not taking: Reported on 10/05/2022 05/19/22   Bo Merino I, NP  pantoprazole (PROTONIX) 40 MG tablet Take 1 tablet (40 mg total) by mouth daily. Patient not taking: Reported on 03/17/2022 09/07/21 09/07/22  Vevelyn Francois, NP  risperiDONE (RISPERDAL) 2 MG tablet Take 1 tablet (2 mg total) by mouth at bedtime. Patient not taking: Reported on 10/05/2022 05/10/22   Revonda Humphrey, NP      Allergies    Penicillins    Review of Systems   Review of Systems  Respiratory: Negative.    Cardiovascular: Negative.   Musculoskeletal:  Positive for back pain and myalgias.    Physical Exam Updated Vital Signs BP 123/85   Pulse 68   Temp 98.5 F (36.9 C) (Oral)   Resp 16   LMP 09/01/2022   SpO2 97%  Physical Exam Vitals and nursing note reviewed.  Constitutional:      Appearance: She is not ill-appearing or toxic-appearing.  HENT:     Head: Normocephalic and atraumatic.     Mouth/Throat:     Mouth: Mucous membranes are moist.     Pharynx: No oropharyngeal exudate or posterior oropharyngeal erythema.  Eyes:     General:        Right eye: No discharge.        Left eye: No discharge.     Conjunctiva/sclera: Conjunctivae normal.  Cardiovascular:     Rate  and Rhythm: Normal rate and regular rhythm.     Pulses: Normal pulses.     Heart sounds: Normal heart sounds. No murmur heard. Pulmonary:     Effort: Pulmonary effort is normal. No tachypnea, bradypnea, accessory muscle usage, prolonged expiration or respiratory distress.     Breath sounds: Normal breath sounds. No wheezing or rales.  Abdominal:     General: Bowel sounds are normal. There is no distension.     Palpations: Abdomen is soft.     Tenderness: There is generalized abdominal tenderness. There is no right CVA tenderness, left CVA tenderness, guarding or rebound.  Musculoskeletal:        General: No deformity.     Cervical back: Neck supple.     Right lower leg: No edema.     Left lower leg: No  edema.  Skin:    General: Skin is warm and dry.     Capillary Refill: Capillary refill takes less than 2 seconds.  Neurological:     General: No focal deficit present.     Mental Status: She is alert and oriented to person, place, and time. Mental status is at baseline.  Psychiatric:        Mood and Affect: Mood normal.     ED Results / Procedures / Treatments   Labs (all labs ordered are listed, but only abnormal results are displayed) Labs Reviewed  CBC WITH DIFFERENTIAL/PLATELET - Abnormal; Notable for the following components:      Result Value   Hemoglobin 11.1 (*)    HCT 30.3 (*)    MCV 74.4 (*)    MCHC 36.6 (*)    Platelets 447 (*)    nRBC 2.0 (*)    Eosinophils Absolute 1.3 (*)    All other components within normal limits  SARS CORONAVIRUS 2 BY RT PCR  COMPREHENSIVE METABOLIC PANEL  RETICULOCYTES  I-STAT BETA HCG BLOOD, ED ([facility], [facility], [facility] ONLY)    EKG None  Radiology No results found.  Procedures Procedures    Medications Ordered in ED Medications  HYDROmorphone (DILAUDID) injection 1 mg (1 mg Intravenous Given 10/05/22 0131)  ondansetron (ZOFRAN) injection 4 mg (4 mg Intravenous Given 10/05/22 0132)  diphenhydrAMINE (BENADRYL) injection 12.5 mg (12.5 mg Intravenous Given 10/05/22 0133)  sodium chloride 0.9 % bolus 1,000 mL (0 mLs Intravenous Stopped 10/05/22 0521)  HYDROmorphone (DILAUDID) injection 1 mg (1 mg Intravenous Given 10/05/22 0300)  ketorolac (TORADOL) 15 MG/ML injection 15 mg (15 mg Intravenous Given 10/05/22 0357)  HYDROmorphone (DILAUDID) injection 2 mg (2 mg Intravenous Given 10/05/22 0458)  HYDROmorphone (DILAUDID) injection 2 mg (2 mg Intravenous Given 10/05/22 0630)    ED Course/ Medical Decision Making/ A&P Clinical Course as of 10/05/22 1638  Tue Oct 05, 2022  0501 Patient reevaluated and and requesting more pain medication but looks improved on exam, moving about the room, eating a full meal without complications.  [RS]  0630 Consult  to Dr. Marlowe Sax, hospitalist was agreeable to admit this patient to her service for sickle cell pain crisis.  Appreciate her collaboration in care of patient. [RS]  0631   Make sure [RS]    Clinical Course User Index [RS] Emeline Darling, PA-C                           Medical Decision Making 36 year old female with sickle cell crisis with pain in her back and her legs.  Vital signs reassuring on intake.  Cardiopulmonary abdominal exams are benign.  Tenderness palpation of the back and legs with normal neurovascular status in the arms and the legs.    Amount and/or Complexity of Data Reviewed Labs: ordered.    Details: CBC without leukocytosis with mild anemia with hemoglobin 11 at patient's baseline.  CMP with normal renal function, liver function, and electrolytes.  Reticulocyte counts are normal.   Risk Prescription drug management. Decision regarding hospitalization.    Patient with multiple doses of Dilaudid and Toradol in the emergency department without significant improvement in her pain, pain continues to be 9 out of 10.  At this point patient feels she cannot be safely discharged home as she cannot refill her oxycodone until 10/19 and is out of it at home.  Do feel it is reasonable to proceed with admission at this time.  Consult hospitalist as above.  Clinical picture most consistent with uncomplicated sickle cell pain crisis.  No evidence of sequestration crisis or aplastic anemia at this time.  Esme  voiced understanding of her medical evaluation and treatment plan. Each of their questions answered to their expressed satisfaction.  Return precautions were given.  Patient is well-appearing, stable, and was discharged in good condition.  This chart was dictated using voice recognition software, Dragon. Despite the best efforts of this provider to proofread and correct errors, errors may still occur which can change documentation meaning.   Final Clinical  Impression(s) / ED Diagnoses Final diagnoses:  Sickle cell pain crisis [facility])    Rx / DC Orders ED Discharge Orders     None         Aura Dials 10/05/22 4081    Quintella Reichert, MD 10/05/22 [phone]

## 2022-10-05 NOTE — ED Notes (Signed)
Pt sitting up in bed, pt reports decreased pain, pt requests food and drink, both provided, resps even and unlabored, pt awaits admission, pt also requests pain medication.

## 2022-10-05 NOTE — ED Notes (Signed)
Patient given water and breakfast sandwhich

## 2022-10-05 NOTE — H&P (Signed)
H&P  Patient Demographics:  Hannah Hawkins, is a 36 y.o. female  MRN: [id number]   DOB - 10/13/1986  Admit Date - 10/04/2022  Outpatient Primary MD for the patient is Fenton Foy, NP  Chief Complaint  Patient presents with   Sickle Cell Pain Crisis      HPI:   Hannah Hawkins  is a 36 y.o. female with a medical history significant for sickle cell disease type [place], chronic pain syndrome, opiate dependence and tolerance, history of cocaine abuse, history of anemia of chronic disease presented to the emergency department with complaints of pain to back and lower extremities.  She attributes current pain crisis to running out of her home oxycodone 3 days ago.  Patient says that she typically can manage at home with her oral medications.  Currently, patient characterizes her pain as constant and aching.  Her pain intensity is 8/10.  She denies any fever, chills, chest pain, or shortness of breath.  No urinary symptoms, nausea, vomiting, or diarrhea.  No sick contacts, recent travel, or known exposure to COVID-19.  ER course: Patient's vital signs remained stable throughout her admission.  Reviewed CBC, CMP, reticulocytes, all unremarkable.  Urine drug screen pending.  COVID-19 negative.  Patient's pain persists despite IV Dilaudid, IV Toradol, and IV fluids.  Patient will be admitted for sickle cell pain crisis.   Review of systems:  In addition to the HPI above, patient reports No fever or chills No Headache, No changes with vision or hearing No problems swallowing food or liquids No chest pain, cough or shortness of breath No abdominal pain, No nausea or vomiting, Bowel movements are regular No blood in stool or urine No dysuria No new skin rashes or bruises No new joints pains-aches No new weakness, tingling, numbness in any extremity No recent weight gain or loss No polyuria, polydypsia or polyphagia No significant Mental Stressors  With Past History of the following :    Past Medical History:  Diagnosis Date   Sickle cell anemia (HCC)       Past Surgical History:  Procedure Laterality Date   CHOLECYSTECTOMY N/A 06/03/2021   Procedure: LAPAROSCOPIC CHOLECYSTECTOMY;  Surgeon: Coralie Keens, MD;  Location: [facility];  Service: General;  Laterality: N/A;   EYE SURGERY  03/09/2017   left eye   EYE SURGERY Right 09/2019   right [place]      Social History:   Social History   Tobacco Use   Smoking status: Former    Types: Cigarettes   Smokeless tobacco: Never  Substance Use Topics   Alcohol use: Not Currently     Lives - At home   Family History :   Family History  Problem Relation Age of Onset   Diabetes Father      Home Medications:   Prior to Admission medications   Medication Sig Start Date End Date Taking? Authorizing Provider  lidocaine (HM LIDOCAINE PATCH) 4 % Place 1 patch onto the skin daily. 09/22/22  Yes Fenton Foy, NP  oxyCODONE (ROXICODONE) 15 MG immediate release tablet Take 1 tablet (15 mg total) by mouth every 6 (six) hours as needed for pain. 09/23/22  Yes Fenton Foy, NP  fluticasone (FLONASE) 50 MCG/ACT nasal spray Place 2 sprays into both nostrils daily. Patient not taking: Reported on 10/05/2022 08/14/21   Vevelyn Francois, NP  montelukast (SINGULAIR) 10 MG tablet Take 1 tablet (10 mg total) by mouth at bedtime. Patient not taking: Reported on 10/05/2022 08/14/21  Dionisio David M, NP  NARCAN 4 MG/0.1ML LIQD nasal spray kit Place 4 mg into the nose once as needed (overdose). 12/22/17   [provider]  norethindrone (MICRONOR) 0.35 MG tablet Take 1 tablet (0.35 mg total) by mouth daily. Patient not taking: Reported on 10/05/2022 09/08/21   Vevelyn Francois, NP  ondansetron (ZOFRAN) 8 MG tablet Take 1 tablet (8 mg total) by mouth every 8 (eight) hours as needed for nausea or vomiting. Patient not taking: Reported on 10/05/2022 05/19/22   Bo Merino I, NP  pantoprazole (PROTONIX) 40 MG tablet Take 1  tablet (40 mg total) by mouth daily. Patient not taking: Reported on 03/17/2022 09/07/21 09/07/22  Vevelyn Francois, NP  risperiDONE (RISPERDAL) 2 MG tablet Take 1 tablet (2 mg total) by mouth at bedtime. Patient not taking: Reported on 10/05/2022 05/10/22   Revonda Humphrey, NP     Allergies:   Allergies  Allergen Reactions   Penicillins Swelling and Rash    Has patient had a PCN reaction causing immediate rash, facial/tongue/throat swelling, SOB or lightheadedness with hypotension: Yes Has patient had a PCN reaction causing severe rash involving mucus membranes or skin necrosis: Yes Has patient had a PCN reaction that required hospitalization No Has patient had a PCN reaction occurring within the last 10 years: no If all of the above answers are "NO", then may proceed with Cephalosporin use.      Physical Exam:   Vitals:   Vitals:   10/05/22 0645 10/05/22 0754  BP:  101/72  Pulse: 63 62  Resp:  17  Temp:  97.8 F (36.6 C)  SpO2: 96% 100%    Physical Exam: Constitutional: Patient appears well-developed and well-nourished. Not in obvious distress. HENT: Normocephalic, atraumatic, External right and left ear normal. Oropharynx is clear and moist.  Eyes: Conjunctivae and EOM are normal. PERRLA, no scleral icterus. Neck: Normal ROM. Neck supple. No JVD. No tracheal deviation. No thyromegaly. CVS: RRR, S1/S2 +, no murmurs, no gallops, no carotid bruit.  Pulmonary: Effort and breath sounds normal, no stridor, rhonchi, wheezes, rales.  Abdominal: Soft. BS +, no distension, tenderness, rebound or guarding.  Musculoskeletal: Normal range of motion. No edema and no tenderness.  Lymphadenopathy: No lymphadenopathy noted, cervical, inguinal or axillary Neuro: Alert. Normal reflexes, muscle tone coordination. No cranial nerve deficit. Skin: Skin is warm and dry. No rash noted. Not diaphoretic. No erythema. No pallor. Psychiatric: Normal mood and affect. Behavior, judgment, thought  content normal.   Data Review:   CBC Recent Labs  Lab 10/05/22 0026  WBC 8.8  HGB 11.1*  HCT 30.3*  PLT 447*  MCV 74.4*  MCH 27.3  MCHC 36.6*  RDW 12.5  LYMPHSABS 2.8  MONOABS 0.8  EOSABS 1.3*  BASOSABS 0.1   ------------------------------------------------------------------------------------------------------------------  Chemistries  Recent Labs  Lab 10/05/22 0026  NA 139  K 3.5  CL 108  CO2 26  GLUCOSE 82  BUN 10  CREATININE 0.65  CALCIUM 8.9  AST 18  ALT 17  ALKPHOS 44  BILITOT 1.1   ------------------------------------------------------------------------------------------------------------------ estimated creatinine clearance is 78.9 mL/min (by C-G formula based on SCr of 0.65 mg/dL). ------------------------------------------------------------------------------------------------------------------ No results for input(s): "TSH", "T4TOTAL", "T3FREE", "THYROIDAB" in the last 72 hours.  Invalid input(s): "FREET3"  Coagulation profile No results for input(s): "INR", "PROTIME" in the last 168 hours. ------------------------------------------------------------------------------------------------------------------- No results for input(s): "DDIMER" in the last 72 hours. -------------------------------------------------------------------------------------------------------------------  Cardiac Enzymes No results for input(s): "CKMB", "TROPONINI", "MYOGLOBIN" in the last 168 hours.  Invalid input(s): "CK" ------------------------------------------------------------------------------------------------------------------    Component Value Date/Time   BNP 27.4 03/07/2020 1532    ---------------------------------------------------------------------------------------------------------------  Urinalysis    Component Value Date/Time   COLORURINE YELLOW 12/23/2021 0240   APPEARANCEUR TURBID (A) 12/23/2021 0240   LABSPEC 1.014 12/23/2021 0240   PHURINE 5.0  12/23/2021 0240   GLUCOSEU NEGATIVE 12/23/2021 0240   HGBUR SMALL (A) 12/23/2021 0240   BILIRUBINUR negative 03/17/2022 1617   BILIRUBINUR neg 04/16/2021 1329   KETONESUR negative 03/17/2022 1617   KETONESUR NEGATIVE 12/23/2021 0240   PROTEINUR 30 (A) 12/23/2021 0240   UROBILINOGEN 1.0 03/17/2022 1617   UROBILINOGEN 1.0 09/30/2017 1513   NITRITE Positive (A) 03/17/2022 1617   NITRITE NEGATIVE 12/23/2021 0240   LEUKOCYTESUR Trace (A) 03/17/2022 1617   LEUKOCYTESUR LARGE (A) 12/23/2021 0240    ----------------------------------------------------------------------------------------------------------------   Imaging Results:    No results found.   Assessment & Plan:  Principal Problem:   Sickle cell pain crisis ([place]) Active Problems:   Anemia of chronic disease   Chronic pain syndrome   History of cocaine use  Sickle cell disease with pain crisis: Admit to MedSurg. Initiate IV Dilaudid PCA Toradol 15 mg IV every 6 hours Oxycodone 15 mg every 6 hours as needed for severe breakthrough pain IV fluids, 0.45% saline at 75 mL/h Monitor vital signs very closely, reevaluate pain scale regularly, and supplemental oxygen as needed.  Chronic pain syndrome: Continue home medications  Anemia of chronic disease: Patient's hemoglobin is 11.1, appears to be consistent with her baseline.  There is no clinical indication for blood transfusion at this time.  Follow labs in AM.  History of cocaine abuse: Urine drug screen pending.   DVT Prophylaxis: Subcut Lovenox   AM Labs Ordered, also please review Full Orders  Family Communication: Admission, patient's condition and plan of care including tests being ordered have been discussed with the patient who indicate understanding and agree with the plan and Code Status.  Code Status: Full Code  Consults called: None    Admission status: Inpatient    Time spent in minutes : 30 minutes  Elizabeth, MSN, FNP-C Patient  [facility] [address] [place], [place] [address] [phone]  10/05/2022 at 8:34 AM

## 2022-10-06 DIAGNOSIS — D5700 Hb-SS disease with crisis, unspecified: Secondary | ICD-10-CM | POA: Diagnosis not present

## 2022-10-06 MED ORDER — HYDROMORPHONE 1 MG/ML IV SOLN
INTRAVENOUS | Status: DC
  Administered 2022-10-06: 1.5 mg via INTRAVENOUS
  Administered 2022-10-06: 2.5 mg via INTRAVENOUS
  Administered 2022-10-06: 3 mg via INTRAVENOUS
  Administered 2022-10-07: 1.5 mg via INTRAVENOUS
  Administered 2022-10-07 (×2): 3 mg via INTRAVENOUS
  Filled 2022-10-06: qty 30

## 2022-10-06 NOTE — Progress Notes (Signed)
Subjective: Hannah Hawkins is a 36 year old female with a medical history significant for sickle cell disease type [place], chronic pain syndrome, opiate dependence and tolerance, history of cocaine abuse, history of anemia of chronic disease that was admitted for sickle cell pain crisis. Patient states that pain intensity has improved some overnight.  Is primarily to low back and lower extremities.  She rates pain as 7/10.  Patient states that she cannot function at home on today.  She denies any headache, dizziness, chest pain, urinary symptoms, nausea, vomiting, or diarrhea.  Objective:  Vital signs in last 24 hours:  Vitals:   10/06/22 0645 10/06/22 0740 10/06/22 0921 10/06/22 1108  BP: 114/62  (!) 114/59   Pulse: 60  81   Resp: 18 18 17 17   Temp: 97.9 F (36.6 C)  97.6 F (36.4 C)   TempSrc: Oral  Oral   SpO2: 98% 100% 100% 100%  Weight:      Height:        Intake/Output from previous day:   Intake/Output Summary (Last 24 hours) at 10/06/2022 1217 Last data filed at 10/06/2022 1000 Gross per 24 hour  Intake 2355.5 ml  Output 0 ml  Net 2355.5 ml    Physical Exam: General: Alert, awake, oriented x3, in no acute distress.  HEENT: [place]/AT PEER[place], EOMI Neck: Trachea midline,  no masses, no thyromegal,y no JVD, no carotid bruit OROPHARYNX:  Moist, No exudate/ erythema/lesions.  Heart: Regular rate and rhythm, without murmurs, rubs, gallops, PMI non-displaced, no heaves or thrills on palpation.  Lungs: Clear to auscultation, no wheezing or rhonchi noted. No increased vocal fremitus resonant to percussion  Abdomen: Soft, nontender, nondistended, positive bowel sounds, no masses no hepatosplenomegaly noted..  Neuro: No focal neurological deficits noted cranial nerves II through XII grossly intact. DTRs 2+ bilaterally upper and lower extremities. Strength 5 out of 5 in bilateral upper and lower extremities. Musculoskeletal: No warm swelling or erythema around joints, no spinal  tenderness noted. Psychiatric: Patient alert and oriented x3, good insight and cognition, good recent to remote recall. Lymph node survey: No cervical axillary or inguinal lymphadenopathy noted.  Lab Results:  Basic Metabolic Panel:    Component Value Date/Time   NA 139 10/05/2022 0026   NA 139 09/22/2022 1530   K 3.5 10/05/2022 0026   CL 108 10/05/2022 0026   CO2 26 10/05/2022 0026   BUN 10 10/05/2022 0026   BUN 5 (L) 09/22/2022 1530   CREATININE 0.65 10/05/2022 0026   CREATININE 0.69 08/04/2017 1208   GLUCOSE 82 10/05/2022 0026   CALCIUM 8.9 10/05/2022 0026   CBC:    Component Value Date/Time   WBC 8.8 10/05/2022 0026   HGB 11.1 (L) 10/05/2022 0026   HGB 11.0 (L) 09/22/2022 1530   HCT 30.3 (L) 10/05/2022 0026   HCT 32.8 (L) 09/22/2022 1530   PLT 447 (H) 10/05/2022 0026   PLT 329 09/22/2022 1530   MCV 74.4 (L) 10/05/2022 0026   MCV 84 09/22/2022 1530   NEUTROABS 3.9 10/05/2022 0026   NEUTROABS 5.5 09/22/2022 1530   LYMPHSABS 2.8 10/05/2022 0026   LYMPHSABS 2.2 09/22/2022 1530   MONOABS 0.8 10/05/2022 0026   EOSABS 1.3 (H) 10/05/2022 0026   EOSABS 0.4 09/22/2022 1530   BASOSABS 0.1 10/05/2022 0026   BASOSABS 0.1 09/22/2022 1530    Recent Results (from the past 240 hour(s))  SARS Coronavirus 2 by RT PCR (hospital order, performed in [facility] hospital lab) *cepheid single result test* Anterior Nasal Swab  Status: None   Collection Time: 10/05/22  6:16 AM   Specimen: Anterior Nasal Swab  Result Value Ref Range Status   SARS Coronavirus 2 by RT PCR NEGATIVE NEGATIVE Final    Comment: (NOTE) SARS-CoV-2 target nucleic acids are NOT DETECTED.  The SARS-CoV-2 RNA is generally detectable in upper and lower respiratory specimens during the acute phase of infection. The lowest concentration of SARS-CoV-2 viral copies this assay can detect is 250 copies / mL. A negative result does not preclude SARS-CoV-2 infection and should not be used as the sole basis for  treatment or other patient management decisions.  A negative result may occur with improper specimen collection / handling, submission of specimen other than nasopharyngeal swab, presence of viral mutation(s) within the areas targeted by this assay, and inadequate number of viral copies (<250 copies / mL). A negative result must be combined with clinical observations, patient history, and epidemiological information.  Fact Sheet for Patients:   RoadLapTop.co.za  Fact Sheet for Healthcare Providers: http://kim-miller.com/  This test is not yet approved or  cleared by the [place] FDA and has been authorized for detection and/or diagnosis of SARS-CoV-2 by FDA under an Emergency Use Authorization (EUA).  This EUA will remain in effect (meaning this test can be used) for the duration of the COVID-19 declaration under Section 564(b)(1) of the Act, 21 U.S.C. section 360bbb-3(b)(1), unless the authorization is terminated or revoked sooner.  Performed at [facility], [address], [place], [place] [address]     Studies/Results: No results found.  Medications: Scheduled Meds:  enoxaparin (LOVENOX) injection  40 mg Subcutaneous Q24H   folic acid  1 mg Oral Daily   folic acid  1 mg Oral Daily   HYDROmorphone   Intravenous Q4H   ketorolac  15 mg Intravenous Q6H   montelukast  10 mg Oral QHS   risperiDONE  2 mg Oral QHS   senna-docusate  1 tablet Oral BID   Continuous Infusions:  sodium chloride 75 mL/hr at 10/06/22 0238   PRN Meds:.diphenhydrAMINE, lip balm, naloxone **AND** sodium chloride flush, ondansetron (ZOFRAN) IV, oxyCODONE, polyethylene glycol  Consultants: none  Procedures: none  Antibiotics: none  Assessment/Plan: Principal Problem:   Sickle cell pain crisis (HCC) Active Problems:   Anemia of chronic disease   Chronic pain syndrome   History of cocaine use  Sickle cell disease with  pain crisis: Weaning IV Dilaudid PCA, settings decreased Oxycodone 15 mg every 6 hours as needed for severe breakthrough pain Toradol 15 mg IV every 6 hours Monitor vital signs very closely, reevaluate pain scale regularly, and supplemental oxygen as needed.  Chronic pain syndrome: Continue home medications  Anemia of chronic disease: Patient's hemoglobin stable.  Consistent with patient's baseline.  No clinical indication for blood transfusion today.  Continue to monitor closely.  History of cocaine abuse: Urine drug screen pending.  Code Status: Full Code Family Communication: N/A Disposition Plan: Not yet ready for discharge  Braelyn Bordonaro Rennis Petty  APRN, MSN, FNP-C [facility] [address] [place], [place] [address] [phone]  If 7PM-7AM, please contact night-coverage.  10/06/2022, 12:17 PM  LOS: 1 day

## 2022-10-06 NOTE — TOC Progression Note (Signed)
Transition of Care [facility]) - Progression Note    Patient Details  Name: Hannah Hawkins MRN: [id number] Date of Birth: 1986/10/26  Transition of Care [facility]) CM/SW Contact  Henrietta Dine, RN Phone Number: 10/06/2022, 9:49 AM  Clinical Narrative:     Transition of Care [facility]) Screening Note   Patient Details  Name: Hannah Hawkins Date of Birth: 04-Dec-1986   Transition of Care [facility]) CM/SW Contact:    Henrietta Dine, RN Phone Number: 10/06/2022, 9:49 AM    Transition of Care Department [facility]) has reviewed patient and no TOC needs have been identified at this time. We will continue to monitor patient advancement through interdisciplinary progression rounds. If new patient transition needs arise, please place a TOC consult.          Expected Discharge Plan and Services                                                 Social Determinants of Health (SDOH) Interventions    Readmission Risk Interventions    08/08/2020   10:48 AM  Readmission Risk Prevention Plan  PCP or Specialist appointment within 3-5 days of discharge Not Complete  PCP/Specialist Appt Not Complete comments First available was 08/28/20

## 2022-10-06 NOTE — Progress Notes (Signed)
Patient has been educated several times by NT to urinate in the hat but patient continues to dump urine.

## 2022-10-07 ENCOUNTER — Other Ambulatory Visit: Payer: Self-pay | Admitting: Family Medicine

## 2022-10-07 ENCOUNTER — Other Ambulatory Visit: Payer: Self-pay | Admitting: Nurse Practitioner

## 2022-10-07 DIAGNOSIS — D5700 Hb-SS disease with crisis, unspecified: Secondary | ICD-10-CM | POA: Diagnosis not present

## 2022-10-07 LAB — CBC
HCT: 26.7 % — ABNORMAL LOW (ref 36.0–46.0)
Hemoglobin: 9.9 g/dL — ABNORMAL LOW (ref 12.0–15.0)
MCH: 27.3 pg (ref 26.0–34.0)
MCHC: 37.1 g/dL — ABNORMAL HIGH (ref 30.0–36.0)
MCV: 73.8 fL — ABNORMAL LOW (ref 80.0–100.0)
Platelets: 359 10*3/uL (ref 150–400)
RBC: 3.62 MIL/uL — ABNORMAL LOW (ref 3.87–5.11)
RDW: 13 % (ref 11.5–15.5)
WBC: 11.9 10*3/uL — ABNORMAL HIGH (ref 4.0–10.5)
nRBC: 2.9 % — ABNORMAL HIGH (ref 0.0–0.2)

## 2022-10-07 LAB — HIV ANTIBODY (ROUTINE TESTING W REFLEX): HIV Screen 4th Generation wRfx: NONREACTIVE

## 2022-10-07 MED ORDER — LIDOCAINE 5 % EX PTCH
1.0000 | MEDICATED_PATCH | CUTANEOUS | Status: AC
  Administered 2022-10-07: 1 via TRANSDERMAL
  Filled 2022-10-07: qty 1

## 2022-10-07 NOTE — Progress Notes (Signed)
Pt c/o dilaudid PCA not helping with her pain, offered Oxy but pt refused stated that pill does not help me. On call Np Zebedee Iba made aware with no new order.
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
## 2022-10-07 NOTE — TOC Transition Note (Signed)
Transition of Care [facility]) - CM/SW Discharge Note   Patient Details  Name: Hannah Hawkins MRN: [id number] Date of Birth: [birth year]  Transition of Care [facility]) CM/SW Contact:  Henrietta Dine, RN Phone Number: 10/07/2022, 2:01 PM   Clinical Narrative:    Notified by pt's nurse that pt does not have transportation home; per [facility], pt is able safely ride bus home; bus pass given to [place] for pt; also spoke with pt and RN in the room; bus pass provided; per [place], the pt says she can find someone to take her home; the pt also says that she can safely get home; no TOC needs.       Patient Goals and CMS Choice        Discharge Placement                       Discharge Plan and Services                                     Social Determinants of Health (SDOH) Interventions     Readmission Risk Interventions    08/08/2020   10:48 AM  Readmission Risk Prevention Plan  PCP or Specialist appointment within 3-5 days of discharge Not Complete  PCP/Specialist Appt Not Complete comments First available was 08/28/20

## 2022-10-07 NOTE — Progress Notes (Signed)
Discharge instructions discussed with patient, verbalized agreement and understanding 

## 2022-10-07 NOTE — TOC Progression Note (Signed)
Transition of Care [facility]) - Progression Note    Patient Details  Name: Ailine Hefferan MRN: [id number] Date of Birth: 05/09/1986  Transition of Care [facility]) CM/SW Contact  Henrietta Dine, RN Phone Number: 10/07/2022, 12:09 PM  Clinical Narrative:     Transition of Care [facility]) Screening Note   Patient Details  Name: Swayze Kozuch Date of Birth: 1985/12/22   Transition of Care [facility]) CM/SW Contact:    Henrietta Dine, RN Phone Number: 10/07/2022, 12:09 PM    Transition of Care Department [facility]) has reviewed patient and no TOC needs have been identified at this time. We will continue to monitor patient advancement through interdisciplinary progression rounds. If new patient transition needs arise, please place a TOC consult.          Expected Discharge Plan and Services           Expected Discharge Date: 10/07/22                                     Social Determinants of Health (SDOH) Interventions    Readmission Risk Interventions    08/08/2020   10:48 AM  Readmission Risk Prevention Plan  PCP or Specialist appointment within 3-5 days of discharge Not Complete  PCP/Specialist Appt Not Complete comments First available was 08/28/20

## 2022-10-08 ENCOUNTER — Emergency Department (HOSPITAL_COMMUNITY): Payer: Medicaid Other

## 2022-10-08 ENCOUNTER — Other Ambulatory Visit: Payer: Self-pay

## 2022-10-08 ENCOUNTER — Other Ambulatory Visit: Payer: Self-pay | Admitting: Nurse Practitioner

## 2022-10-08 ENCOUNTER — Inpatient Hospital Stay (HOSPITAL_COMMUNITY)
Admission: EM | Admit: 2022-10-08 | Discharge: 2022-10-11 | DRG: 812 | Disposition: A | Discharge status: Home / Self Care | Payer: Medicaid Other | Attending: Internal Medicine | Admitting: Internal Medicine

## 2022-10-08 ENCOUNTER — Telehealth (HOSPITAL_COMMUNITY): Payer: Self-pay

## 2022-10-08 ENCOUNTER — Telehealth: Payer: Self-pay | Admitting: Nurse Practitioner

## 2022-10-08 ENCOUNTER — Encounter (HOSPITAL_COMMUNITY): Payer: Self-pay

## 2022-10-08 DIAGNOSIS — D5720 Sickle-cell/Hb-C disease without crisis: Secondary | ICD-10-CM

## 2022-10-08 DIAGNOSIS — D638 Anemia in other chronic diseases classified elsewhere: Secondary | ICD-10-CM | POA: Diagnosis present

## 2022-10-08 DIAGNOSIS — Z9049 Acquired absence of other specified parts of digestive tract: Secondary | ICD-10-CM

## 2022-10-08 DIAGNOSIS — Z79899 Other long term (current) drug therapy: Secondary | ICD-10-CM

## 2022-10-08 DIAGNOSIS — F1491 Cocaine use, unspecified, in remission: Secondary | ICD-10-CM | POA: Diagnosis present

## 2022-10-08 DIAGNOSIS — G894 Chronic pain syndrome: Secondary | ICD-10-CM

## 2022-10-08 DIAGNOSIS — D5700 Hb-SS disease with crisis, unspecified: Principal | ICD-10-CM | POA: Diagnosis present

## 2022-10-08 DIAGNOSIS — Z87891 Personal history of nicotine dependence: Secondary | ICD-10-CM

## 2022-10-08 DIAGNOSIS — Z833 Family history of diabetes mellitus: Secondary | ICD-10-CM

## 2022-10-08 DIAGNOSIS — K219 Gastro-esophageal reflux disease without esophagitis: Secondary | ICD-10-CM | POA: Diagnosis present

## 2022-10-08 DIAGNOSIS — Z880 Allergy status to penicillin: Secondary | ICD-10-CM

## 2022-10-08 LAB — CBC WITH DIFFERENTIAL/PLATELET
Abs Immature Granulocytes: 0.03 10*3/uL (ref 0.00–0.07)
Basophils Absolute: 0.1 10*3/uL (ref 0.0–0.1)
Basophils Relative: 1 %
Eosinophils Absolute: 0.6 10*3/uL — ABNORMAL HIGH (ref 0.0–0.5)
Eosinophils Relative: 6 %
HCT: 31.5 % — ABNORMAL LOW (ref 36.0–46.0)
Hemoglobin: 11.5 g/dL — ABNORMAL LOW (ref 12.0–15.0)
Immature Granulocytes: 0 %
Lymphocytes Relative: 22 %
Lymphs Abs: 2.2 10*3/uL (ref 0.7–4.0)
MCH: 27.3 pg (ref 26.0–34.0)
MCHC: 36.5 g/dL — ABNORMAL HIGH (ref 30.0–36.0)
MCV: 74.8 fL — ABNORMAL LOW (ref 80.0–100.0)
Monocytes Absolute: 0.6 10*3/uL (ref 0.1–1.0)
Monocytes Relative: 6 %
Neutro Abs: 6.4 10*3/uL (ref 1.7–7.7)
Neutrophils Relative %: 65 %
Platelets: 402 10*3/uL — ABNORMAL HIGH (ref 150–400)
RBC: 4.21 MIL/uL (ref 3.87–5.11)
RDW: 13 % (ref 11.5–15.5)
WBC: 9.8 10*3/uL (ref 4.0–10.5)
nRBC: 0 % (ref 0.0–0.2)

## 2022-10-08 LAB — COMPREHENSIVE METABOLIC PANEL
ALT: 14 U/L (ref 0–44)
AST: 28 U/L (ref 15–41)
Albumin: 3.9 g/dL (ref 3.5–5.0)
Alkaline Phosphatase: 44 U/L (ref 38–126)
Anion gap: 5 (ref 5–15)
BUN: 10 mg/dL (ref 6–20)
CO2: 26 mmol/L (ref 22–32)
Calcium: 8.8 mg/dL — ABNORMAL LOW (ref 8.9–10.3)
Chloride: 106 mmol/L (ref 98–111)
Creatinine, Ser: 0.62 mg/dL (ref 0.44–1.00)
GFR, Estimated: >60 mL/min (ref >60)
Glucose, Bld: 115 mg/dL — ABNORMAL HIGH (ref 70–99)
Potassium: 4 mmol/L (ref 3.5–5.1)
Sodium: 137 mmol/L (ref 135–145)
Total Bilirubin: 1.1 mg/dL (ref 0.3–1.2)
Total Protein: 7.2 g/dL (ref 6.5–8.1)

## 2022-10-08 LAB — RETICULOCYTES
Immature Retic Fract: 27.8 % — ABNORMAL HIGH (ref 2.3–15.9)
RBC.: 4.25 MIL/uL (ref 3.87–5.11)
Retic Count, Absolute: 196.3 10*3/uL — ABNORMAL HIGH (ref 19.0–186.0)
Retic Ct Pct: 4.6 % — ABNORMAL HIGH (ref 0.4–3.1)

## 2022-10-08 LAB — I-STAT BETA HCG BLOOD, ED (MC, WL, AP ONLY): I-stat hCG, quantitative: <5 m[IU]/mL (ref <5)

## 2022-10-08 MED ORDER — HYDROMORPHONE HCL 2 MG/ML IJ SOLN
2.0000 mg | INTRAMUSCULAR | Status: AC
  Administered 2022-10-09: 2 mg via INTRAVENOUS
  Filled 2022-10-08: qty 1

## 2022-10-08 MED ORDER — ONDANSETRON HCL 4 MG/2ML IJ SOLN
4.0000 mg | INTRAMUSCULAR | Status: DC | PRN
  Administered 2022-10-09 – 2022-10-11 (×2): 4 mg via INTRAVENOUS
  Filled 2022-10-08 (×3): qty 2

## 2022-10-08 MED ORDER — HYDROMORPHONE HCL 2 MG/ML IJ SOLN
2.0000 mg | INTRAMUSCULAR | Status: AC
  Administered 2022-10-08: 2 mg via INTRAVENOUS
  Filled 2022-10-08: qty 1

## 2022-10-08 MED ORDER — OXYCODONE HCL 15 MG PO TABS: 15.0000 mg | ORAL_TABLET | Freq: Four times a day (QID) | ORAL | 0 refills | Status: DC | PRN

## 2022-10-08 MED ORDER — SODIUM CHLORIDE 0.9 % IV SOLN
12.5000 mg | Freq: Once | INTRAVENOUS | Status: AC
  Administered 2022-10-08: 12.5 mg via INTRAVENOUS
  Filled 2022-10-08: qty 12.5

## 2022-10-08 MED ORDER — SODIUM CHLORIDE 0.45 % IV SOLN: INTRAVENOUS | Status: DC

## 2022-10-08 NOTE — ED Triage Notes (Signed)
Pt arrived via EMS, from home, c/o [facility]. Recently d/c for same.

## 2022-10-08 NOTE — ED Provider Notes (Signed)
[place] DEPT Provider Note   CSN: [id number] Arrival date & time: 10/08/22  1522     History {Add pertinent medical, surgical, social history, OB history to HPI:1} Chief Complaint  Patient presents with   Sickle Cell Pain Crisis    Jewelene Mairena is a 36 y.o. female with history of sickle cell anemia, GERD, cocaine use, and prior cholecystectomy who presents to the emergency department with complaints of sickle cell pain crisis that returned last night. Patient reports pain is all over but primarily in there bilateral lower extremities. Constant, worse with movement, no alleviating factors. Does not currently have any of her oxycodone at home. Pain similar to her recent hospital admission 10/17 through 10/19 2023. Had some chest pain earlier, resolved however and not occurring at present. Denies fever, chills, dyspnea, nausea, or vomiting. This feels like her usual sickle cell pain.   HPI     Home Medications Prior to Admission medications   Medication Sig Start Date End Date Taking? Authorizing Provider  fluticasone (FLONASE) 50 MCG/ACT nasal spray Place 2 sprays into both nostrils daily. Patient not taking: Reported on 10/05/2022 08/14/21   Vevelyn Francois, NP  lidocaine (HM LIDOCAINE PATCH) 4 % Place 1 patch onto the skin daily. 09/22/22   Fenton Foy, NP  montelukast (SINGULAIR) 10 MG tablet Take 1 tablet (10 mg total) by mouth at bedtime. Patient not taking: Reported on 10/05/2022 08/14/21   Vevelyn Francois, NP  [facility] 4 MG/0.1ML LIQD nasal spray kit Place 4 mg into the nose once as needed (overdose). 12/22/17   [provider]  norethindrone (MICRONOR) 0.35 MG tablet Take 1 tablet (0.35 mg total) by mouth daily. Patient not taking: Reported on 10/05/2022 09/08/21   Vevelyn Francois, NP  ondansetron (ZOFRAN) 8 MG tablet Take 1 tablet (8 mg total) by mouth every 8 (eight) hours as needed for nausea or vomiting. Patient not taking:  Reported on 10/05/2022 05/19/22   Bo Merino I, NP  oxyCODONE (ROXICODONE) 15 MG immediate release tablet Take 1 tablet (15 mg total) by mouth every 6 (six) hours as needed for pain. 10/08/22   Fenton Foy, NP  pantoprazole (PROTONIX) 40 MG tablet Take 1 tablet (40 mg total) by mouth daily. Patient not taking: Reported on 03/17/2022 09/07/21 09/07/22  Vevelyn Francois, NP  risperiDONE (RISPERDAL) 2 MG tablet Take 1 tablet (2 mg total) by mouth at bedtime. Patient not taking: Reported on 10/05/2022 05/10/22   Revonda Humphrey, NP      Allergies    Penicillins    Review of Systems   Review of Systems  Constitutional:  Negative for chills and fever.  Respiratory:  Negative for cough and shortness of breath.   Cardiovascular:  Positive for chest pain (resolved).  Gastrointestinal:  Negative for abdominal pain, diarrhea, nausea and vomiting.  Musculoskeletal:  Positive for arthralgias and myalgias.  Neurological:  Negative for syncope.  All other systems reviewed and are negative.   Physical Exam Updated Vital Signs BP 131/80 (BP Location: Left Arm)   Pulse 70   Temp 98.9 F (37.2 C) (Oral)   Resp 16   LMP 10/03/2022   SpO2 99%  Physical Exam Vitals and nursing note reviewed.  Constitutional:      General: She is not in acute distress.    Appearance: She is well-developed. She is not ill-appearing or toxic-appearing.  HENT:     Head: Normocephalic and atraumatic.  Eyes:  General:        Right eye: No discharge.        Left eye: No discharge.     Conjunctiva/sclera: Conjunctivae normal.  Cardiovascular:     Rate and Rhythm: Normal rate and regular rhythm.     Pulses:          Dorsalis pedis pulses are 2+ on the right side and 2+ on the left side.       Posterior tibial pulses are 2+ on the right side and 2+ on the left side.  Pulmonary:     Effort: Pulmonary effort is normal. No respiratory distress.     Breath sounds: Normal breath sounds. No wheezing or rales.   Abdominal:     General: There is no distension.     Palpations: Abdomen is soft.     Tenderness: There is no abdominal tenderness.  Musculoskeletal:     Cervical back: Neck supple.     Comments: Lower extremities: No obvious deformity, appreciable swelling, edema, erythema, ecchymosis, warmth, or open wounds. Patient has intact AROM to bilateral hips, knees, ankles, and all digits. Tender to palpation throughout without focal bony tenderness, compartments are soft.   Skin:    General: Skin is warm and dry.     Capillary Refill: Capillary refill takes less than 2 seconds.  Neurological:     Mental Status: She is alert.     Comments: Alert. Clear speech. Sensation grossly intact to bilateral lower extremities. 5/5 strength with plantar/dorsiflexion bilaterally. Patient ambulatory with *** gait, no foot drop noted.   Psychiatric:        Mood and Affect: Mood normal.        Behavior: Behavior normal.     ED Results / Procedures / Treatments   Labs (all labs ordered are listed, but only abnormal results are displayed) Labs Reviewed  CBC WITH DIFFERENTIAL/PLATELET - Abnormal; Notable for the following components:      Result Value   Hemoglobin 11.5 (*)    HCT 31.5 (*)    MCV 74.8 (*)    MCHC 36.5 (*)    Platelets 402 (*)    Eosinophils Absolute 0.6 (*)    All other components within normal limits  RETICULOCYTES - Abnormal; Notable for the following components:   Retic Ct Pct 4.6 (*)    Retic Count, Absolute 196.3 (*)    Immature Retic Fract 27.8 (*)    All other components within normal limits  COMPREHENSIVE METABOLIC PANEL - Abnormal; Notable for the following components:   Glucose, Bld 115 (*)    Calcium 8.8 (*)    All other components within normal limits    EKG None  Radiology DG Chest Port 1 View  Result Date: 10/08/2022 CLINICAL DATA:  Chest pain sickle cell EXAM: PORTABLE CHEST 1 VIEW COMPARISON:  09/15/2022 FINDINGS: The heart size and mediastinal contours are  within normal limits. Both lungs are clear. The visualized skeletal structures are unremarkable. IMPRESSION: No active disease. Electronically Signed   By: Donavan Foil M.D.   On: 10/08/2022 16:09    Procedures Procedures  {Document cardiac monitor, telemetry assessment procedure when appropriate:1}  Medications Ordered in ED Medications - No data to display  ED Course/ Medical Decision Making/ A&P                           Medical Decision Making    {Document critical care time when appropriate:1} {Document review of labs  and clinical decision tools ie heart score, Chads2Vasc2 etc:1}  {Document your independent review of radiology images, and any outside records:1} {Document your discussion with family members, caretakers, and with consultants:1} {Document social determinants of health affecting pt's care:1} {Document your decision making why or why not admission, treatments were needed:1} Final Clinical Impression(s) / ED Diagnoses Final diagnoses:  None    Rx / DC Orders ED Discharge Orders     None

## 2022-10-08 NOTE — Telephone Encounter (Signed)
Pt called day hospital looking to come in for sickle cell crisis. RN explained that the day hospital is closed today, and also to late in day for sickle cell admissions. Advised pt if unable to manage pain at home to go to ER. RN told patient that day hospital will be open on Monday, and that she can call back then.Pt verbalized understanding.

## 2022-10-08 NOTE — ED Notes (Signed)
Patient requesting IV for lab draw. Triage RN made aware. 

## 2022-10-08 NOTE — ED Provider Triage Note (Signed)
Emergency Medicine Provider Triage Evaluation Note  Hannah Hawkins , a 36 y.o. female  was evaluated in triage.  Pt complains of sickle cell pain crisis.  She mainly complains of pain in her bilateral lower extremities without swelling, mainly in her thighs and calves.  Also has a little chest pain.  She was in the hospital, discharged yesterday.  Pain resumed last night.  She states that she currently does not have home pain medication, refills were just sent in.  No fever.  No shortness of breath.  Review of Systems  Positive: Lower extremity pain, chest pain Negative: Fever  Physical Exam  BP 108/65 (BP Location: Left Arm)   Pulse 84   Temp 98.1 F (36.7 C) (Oral)   Resp 16   LMP 09/01/2022   SpO2 96%  Gen:   Awake, no distress   Resp:  Normal effort  MSK:   Moves extremities without difficulty  Other:  No lower extremity edema or joint swelling  Medical Decision Making  Medically screening exam initiated at 3:40 PM.  Appropriate orders placed.  Hannah Hawkins was informed that the remainder of the evaluation will be completed by another provider, this initial triage assessment does not replace that evaluation, and the importance of remaining in the ED until their evaluation is complete.     Carlisle Cater, PA-C 10/08/22 1541

## 2022-10-08 NOTE — Telephone Encounter (Signed)
Oxycodone refill request.

## 2022-10-09 DIAGNOSIS — Z9049 Acquired absence of other specified parts of digestive tract: Secondary | ICD-10-CM | POA: Diagnosis not present

## 2022-10-09 DIAGNOSIS — Z833 Family history of diabetes mellitus: Secondary | ICD-10-CM | POA: Diagnosis not present

## 2022-10-09 DIAGNOSIS — Z87891 Personal history of nicotine dependence: Secondary | ICD-10-CM | POA: Diagnosis not present

## 2022-10-09 DIAGNOSIS — Z79899 Other long term (current) drug therapy: Secondary | ICD-10-CM | POA: Diagnosis not present

## 2022-10-09 DIAGNOSIS — K219 Gastro-esophageal reflux disease without esophagitis: Secondary | ICD-10-CM | POA: Diagnosis present

## 2022-10-09 DIAGNOSIS — G894 Chronic pain syndrome: Secondary | ICD-10-CM | POA: Diagnosis present

## 2022-10-09 DIAGNOSIS — Z880 Allergy status to penicillin: Secondary | ICD-10-CM | POA: Diagnosis not present

## 2022-10-09 DIAGNOSIS — D5700 Hb-SS disease with crisis, unspecified: Secondary | ICD-10-CM

## 2022-10-09 DIAGNOSIS — D638 Anemia in other chronic diseases classified elsewhere: Secondary | ICD-10-CM | POA: Diagnosis present

## 2022-10-09 LAB — CBC WITH DIFFERENTIAL/PLATELET
Abs Immature Granulocytes: 0.04 10*3/uL (ref 0.00–0.07)
Basophils Absolute: 0.1 10*3/uL (ref 0.0–0.1)
Basophils Relative: 1 %
Eosinophils Absolute: 0.8 10*3/uL — ABNORMAL HIGH (ref 0.0–0.5)
Eosinophils Relative: 8 %
HCT: 27.8 % — ABNORMAL LOW (ref 36.0–46.0)
Hemoglobin: 10.1 g/dL — ABNORMAL LOW (ref 12.0–15.0)
Immature Granulocytes: 0 %
Lymphocytes Relative: 41 %
Lymphs Abs: 4 10*3/uL (ref 0.7–4.0)
MCH: 27.6 pg (ref 26.0–34.0)
MCHC: 36.3 g/dL — ABNORMAL HIGH (ref 30.0–36.0)
MCV: 76 fL — ABNORMAL LOW (ref 80.0–100.0)
Monocytes Absolute: 1 10*3/uL (ref 0.1–1.0)
Monocytes Relative: 10 %
Neutro Abs: 4 10*3/uL (ref 1.7–7.7)
Neutrophils Relative %: 40 %
Platelets: 367 10*3/uL (ref 150–400)
RBC: 3.66 MIL/uL — ABNORMAL LOW (ref 3.87–5.11)
RDW: 13.2 % (ref 11.5–15.5)
WBC: 9.9 10*3/uL (ref 4.0–10.5)
nRBC: 3.5 % — ABNORMAL HIGH (ref 0.0–0.2)

## 2022-10-09 LAB — COMPREHENSIVE METABOLIC PANEL
ALT: 17 U/L (ref 0–44)
AST: 32 U/L (ref 15–41)
Albumin: 3.6 g/dL (ref 3.5–5.0)
Alkaline Phosphatase: 35 U/L — ABNORMAL LOW (ref 38–126)
Anion gap: 9 (ref 5–15)
BUN: 9 mg/dL (ref 6–20)
CO2: 20 mmol/L — ABNORMAL LOW (ref 22–32)
Calcium: 8.4 mg/dL — ABNORMAL LOW (ref 8.9–10.3)
Chloride: 109 mmol/L (ref 98–111)
Creatinine, Ser: 0.65 mg/dL (ref 0.44–1.00)
GFR, Estimated: >60 mL/min (ref >60)
Glucose, Bld: 121 mg/dL — ABNORMAL HIGH (ref 70–99)
Potassium: 3.6 mmol/L (ref 3.5–5.1)
Sodium: 138 mmol/L (ref 135–145)
Total Bilirubin: 0.8 mg/dL (ref 0.3–1.2)
Total Protein: 6.8 g/dL (ref 6.5–8.1)

## 2022-10-09 LAB — MAGNESIUM: Magnesium: 2.3 mg/dL (ref 1.7–2.4)

## 2022-10-09 LAB — TROPONIN I (HIGH SENSITIVITY): Troponin I (High Sensitivity): 6 ng/L (ref <18)

## 2022-10-09 LAB — PHOSPHORUS: Phosphorus: 3.8 mg/dL (ref 2.5–4.6)

## 2022-10-09 MED ORDER — ENOXAPARIN SODIUM 40 MG/0.4ML IJ SOSY
40.0000 mg | PREFILLED_SYRINGE | INTRAMUSCULAR | Status: DC
  Filled 2022-10-09: qty 0.4

## 2022-10-09 MED ORDER — SODIUM CHLORIDE 0.9% FLUSH: 9.0000 mL | INTRAVENOUS | Status: DC | PRN

## 2022-10-09 MED ORDER — HYDROMORPHONE 1 MG/ML IV SOLN
INTRAVENOUS | Status: DC
  Administered 2022-10-09: 1.5 mg via INTRAVENOUS
  Administered 2022-10-09: 2 mg via INTRAVENOUS
  Administered 2022-10-10: 3 mg via INTRAVENOUS
  Administered 2022-10-10: 4 mg via INTRAVENOUS
  Administered 2022-10-10: 2 mg via INTRAVENOUS
  Administered 2022-10-10: 2.5 mg via INTRAVENOUS
  Administered 2022-10-10: 0.5 mg via INTRAVENOUS
  Administered 2022-10-11: 3 mg via INTRAVENOUS
  Administered 2022-10-11: 2 mg via INTRAVENOUS
  Administered 2022-10-11: 2.5 mg via INTRAVENOUS
  Filled 2022-10-09 (×14): qty 30

## 2022-10-09 MED ORDER — SENNOSIDES-DOCUSATE SODIUM 8.6-50 MG PO TABS: 1.0000 | ORAL_TABLET | Freq: Every day | ORAL | Status: DC

## 2022-10-09 MED ORDER — DIPHENHYDRAMINE HCL 25 MG PO CAPS: 25.0000 mg | ORAL_CAPSULE | ORAL | Status: DC | PRN

## 2022-10-09 MED ORDER — POLYETHYLENE GLYCOL 3350 17 G PO PACK: 17.0000 g | PACK | Freq: Every day | ORAL | Status: DC | PRN

## 2022-10-09 MED ORDER — NALOXONE HCL 0.4 MG/ML IJ SOLN: 0.4000 mg | INTRAMUSCULAR | Status: DC | PRN

## 2022-10-09 MED ORDER — OXYCODONE HCL 5 MG PO TABS
15.0000 mg | ORAL_TABLET | Freq: Four times a day (QID) | ORAL | Status: DC | PRN
  Administered 2022-10-09 – 2022-10-11 (×5): 15 mg via ORAL
  Filled 2022-10-09 (×6): qty 3

## 2022-10-09 MED ORDER — HYDROMORPHONE 1 MG/ML IV SOLN
INTRAVENOUS | Status: DC
  Filled 2022-10-09 (×2): qty 30

## 2022-10-09 MED ORDER — KETOROLAC TROMETHAMINE 15 MG/ML IJ SOLN
15.0000 mg | Freq: Once | INTRAMUSCULAR | Status: AC
  Administered 2022-10-09: 15 mg via INTRAVENOUS
  Filled 2022-10-09: qty 1

## 2022-10-09 MED ORDER — HYDROMORPHONE HCL 2 MG/ML IJ SOLN
2.0000 mg | INTRAMUSCULAR | Status: AC
  Administered 2022-10-09: 2 mg via INTRAVENOUS
  Filled 2022-10-09: qty 1

## 2022-10-09 MED ORDER — PANTOPRAZOLE SODIUM 40 MG PO TBEC
40.0000 mg | DELAYED_RELEASE_TABLET | Freq: Every day | ORAL | Status: DC
  Administered 2022-10-11: 40 mg via ORAL
  Filled 2022-10-09 (×2): qty 1

## 2022-10-09 NOTE — Plan of Care (Signed)
  Problem: Education: Goal: Knowledge of vaso-occlusive preventative measures will improve Outcome: Progressing   Problem: Education: Goal: Knowledge of General Education information will improve Description: Including pain rating scale, medication(s)/side effects and non-pharmacologic comfort measures Outcome: Progressing   Problem: Activity: Goal: Risk for activity intolerance will decrease Outcome: Progressing

## 2022-10-09 NOTE — H&P (Signed)
History and Physical  Hannah Hawkins XTK:[id number] DOB: [birth year] DOA: 10/08/2022  Referring physician: Charlyne Quale PA-EDP  PCP: Fenton Foy, NP  Outpatient Specialists: Sickle cell team. Patient coming from: Home.  Chief Complaint: Sickle cell pain crisis.  HPI: Hannah Hawkins is a 36 y.o. female with medical history significant for sickle cell disease, chronic pain syndrome, opiate dependence and tolerance, history of cocaine abuse, history of anemia of chronic disease, GERD, who presented to [facility] ED with complaints of pain in her feet extending all the way up to her hips bilaterally.  Identifies the pain as similar to her known sickle cell pain crisis.  No abdominal pain or vomiting.  No diarrhea.  No other complaints.  Reports, after her last discharge from the hospital on 10/07/2022, she ran out of her pain medications.  In the ED, patient received high-doses IV opiates without control of her pain.  EDP requested admission for pain control.  The patient was admitted by [facility], hospitalist service.  ED Course: Tmax 98.9.  BP 120/77, pulse 64, respiration rate 16, O2 saturation 100% on room air.  Lab studies essentially unremarkable.  Review of Systems: Review of systems as noted in the HPI. All other systems reviewed and are negative.   Past Medical History:  Diagnosis Date   Sickle cell anemia ([place])    Past Surgical History:  Procedure Laterality Date   CHOLECYSTECTOMY N/A 06/03/2021   Procedure: LAPAROSCOPIC CHOLECYSTECTOMY;  Surgeon: Coralie Keens, MD;  Location: [facility] ORS;  Service: General;  Laterality: N/A;   EYE SURGERY  03/09/2017   left eye   EYE SURGERY Right 09/2019   right hemerage     Social History:  reports that she has quit smoking. Her smoking use included cigarettes. She has never used smokeless tobacco. She reports that she does not currently use alcohol. She reports that she does not currently use drugs after having used the following drugs:  Marijuana.   Allergies  Allergen Reactions   Penicillins Swelling and Rash    Has patient had a PCN reaction causing immediate rash, facial/tongue/throat swelling, SOB or lightheadedness with hypotension: Yes Has patient had a PCN reaction causing severe rash involving mucus membranes or skin necrosis: Yes Has patient had a PCN reaction that required hospitalization No Has patient had a PCN reaction occurring within the last 10 years: no If all of the above answers are "NO", then may proceed with Cephalosporin use.     Family History  Problem Relation Age of Onset   Diabetes Father       Prior to Admission medications   Medication Sig Start Date End Date Taking? Authorizing Provider  lidocaine (HM LIDOCAINE PATCH) 4 % Place 1 patch onto the skin daily. 09/22/22  Yes Fenton Foy, NP  NARCAN 4 MG/0.1ML LIQD nasal spray kit Place 4 mg into the nose once as needed (overdose). 12/22/17  Yes [provider]  oxyCODONE (ROXICODONE) 15 MG immediate release tablet Take 1 tablet (15 mg total) by mouth every 6 (six) hours as needed for pain. 10/08/22  Yes Fenton Foy, NP  fluticasone (FLONASE) 50 MCG/ACT nasal spray Place 2 sprays into both nostrils daily. Patient not taking: Reported on 10/05/2022 08/14/21   Vevelyn Francois, NP  montelukast (SINGULAIR) 10 MG tablet Take 1 tablet (10 mg total) by mouth at bedtime. Patient not taking: Reported on 10/05/2022 08/14/21   Vevelyn Francois, NP  norethindrone (MICRONOR) 0.35 MG tablet Take 1 tablet (0.35 mg total) by mouth daily.  Patient not taking: Reported on 10/05/2022 09/08/21   Vevelyn Francois, NP  ondansetron (ZOFRAN) 8 MG tablet Take 1 tablet (8 mg total) by mouth every 8 (eight) hours as needed for nausea or vomiting. Patient not taking: Reported on 10/05/2022 05/19/22   Bo Merino I, NP  pantoprazole (PROTONIX) 40 MG tablet Take 1 tablet (40 mg total) by mouth daily. Patient not taking: Reported on 03/17/2022 09/07/21 09/07/22   Vevelyn Francois, NP  risperiDONE (RISPERDAL) 2 MG tablet Take 1 tablet (2 mg total) by mouth at bedtime. Patient not taking: Reported on 10/05/2022 05/10/22   Revonda Humphrey, NP    Physical Exam: BP 122/81   Pulse 72   Temp 98.6 F (37 C) (Oral)   Resp 16   Ht '5\' 3"'  (1.6 m)   Wt 54.4 kg   LMP 10/03/2022   SpO2 98%   BMI 21.26 kg/m   General: 36 y.o. year-old female well developed well nourished in no acute distress.  Alert and oriented x3. Cardiovascular: Regular rate and rhythm with no rubs or gallops.  No thyromegaly or JVD noted.  No lower extremity edema. 2/4 pulses in all 4 extremities. Respiratory: Clear to auscultation with no wheezes or rales. Good inspiratory effort. Abdomen: Soft nontender nondistended with normal bowel sounds x4 quadrants. Muskuloskeletal: No cyanosis, clubbing or edema noted bilaterally Neuro: CN II-XII intact, strength, sensation, reflexes Skin: No ulcerative lesions noted or rashes Psychiatry: Judgement and insight appear normal. Mood is appropriate for condition and setting          Labs on Admission:  Basic Metabolic Panel: Recent Labs  Lab 10/05/22 0026 10/08/22 1947  NA 139 137  K 3.5 4.0  CL 108 106  CO2 26 26  GLUCOSE 82 115*  BUN 10 10  CREATININE 0.65 0.62  CALCIUM 8.9 8.8*   Liver Function Tests: Recent Labs  Lab 10/05/22 0026 10/08/22 1947  AST 18 28  ALT 17 14  ALKPHOS 44 44  BILITOT 1.1 1.1  PROT 7.4 7.2  ALBUMIN 4.1 3.9   No results for input(s): "LIPASE", "AMYLASE" in the last 168 hours. No results for input(s): "AMMONIA" in the last 168 hours. CBC: Recent Labs  Lab 10/05/22 0026 10/07/22 0430 10/08/22 1947  WBC 8.8 11.9* 9.8  NEUTROABS 3.9  --  6.4  HGB 11.1* 9.9* 11.5*  HCT 30.3* 26.7* 31.5*  MCV 74.4* 73.8* 74.8*  PLT 447* 359 402*   Cardiac Enzymes: No results for input(s): "CKTOTAL", "CKMB", "CKMBINDEX", "TROPONINI" in the last 168 hours.  BNP (last 3 results) No results for input(s): "BNP"  in the last 8760 hours.  ProBNP (last 3 results) No results for input(s): "PROBNP" in the last 8760 hours.  CBG: No results for input(s): "GLUCAP" in the last 168 hours.  Radiological Exams on Admission: DG Chest Port 1 View  Result Date: 10/08/2022 CLINICAL DATA:  Chest pain sickle cell EXAM: PORTABLE CHEST 1 VIEW COMPARISON:  09/15/2022 FINDINGS: The heart size and mediastinal contours are within normal limits. Both lungs are clear. The visualized skeletal structures are unremarkable. IMPRESSION: No active disease. Electronically Signed   By: Donavan Foil M.D.   On: 10/08/2022 16:09    EKG: I independently viewed the EKG done and my findings are as followed: Normal sinus rhythm rate of 74.  Nonspecific ST-T changes.  QTc 448.  Assessment/Plan Present on Admission:  Sickle cell pain crisis ([place])  Principal Problem:   Sickle cell pain crisis ([facility])  Sickle cell  pain pain crisis, unknown trigger Recently admitted for the same 10/05/22-10/07/22. States she ran out of her home pain medications IV fluid hydration Dilaudid pump, IV Dilaudid pushes while in the ED. Bowel regimen IV antiemetics as needed O2 supplementation by nasal cannula to maintain O2 saturation greater than 92%.  GERD Resume home PPI  Anemia of chronic disease Hemoglobin 11.5, at baseline, with MCV 74. No overt bleeding reported Monitor H&H    DVT prophylaxis: Subcu Lovenox daily  Code Status: Full code  Family Communication: None at bedside  Disposition Plan: Admitted to telemetry unit  Consults called: None.  Admission status: Observation status.   Status is: Observation    Kayleen Memos MD [facility] Pager [phone]  If 7PM-7AM, please contact night-coverage [email or web address] Password [facility]  10/09/2022, 3:57 AM

## 2022-10-10 DIAGNOSIS — D5700 Hb-SS disease with crisis, unspecified: Secondary | ICD-10-CM | POA: Diagnosis not present

## 2022-10-10 NOTE — Plan of Care (Signed)
  Problem: Self-Care: Goal: Ability to incorporate actions that prevent/reduce pain crisis will improve Outcome: Progressing   Problem: Respiratory: Goal: Pulmonary complications will be avoided or minimized Outcome: Progressing   Problem: Education: Goal: Knowledge of General Education information will improve Description: Including pain rating scale, medication(s)/side effects and non-pharmacologic comfort measures Outcome: Progressing   Problem: Pain Managment: Goal: General experience of comfort will improve Outcome: Progressing   Problem: Safety: Goal: Ability to remain free from injury will improve Outcome: Progressing

## 2022-10-10 NOTE — Plan of Care (Signed)
  Problem: Fluid Volume: Goal: Ability to maintain a balanced intake and output will improve Outcome: Progressing   Problem: Activity: Goal: Risk for activity intolerance will decrease Outcome: Progressing   Problem: Pain Managment: Goal: General experience of comfort will improve Outcome: Progressing   Problem: Safety: Goal: Ability to remain free from injury will improve Outcome: Progressing
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
## 2022-10-10 NOTE — Plan of Care (Signed)
  P Problem: Safety: Goal: Ability to remain free from injury will improve Outcome: Progressing  Problem: Education: Goal: Knowledge of General Education information will improve Description: Including pain rating scale, medication(s)/side effects and non-pharmacologic comfort measures 10/10/2022 0719 by Olen Cordial, RN Outcome: Progressing 10/10/2022 0719 by Olen Cordial, RN Outcome: Progressing   Problem: Pain Managment: Goal: General experience of comfort will improve Outcome: Progressing

## 2022-10-11 DIAGNOSIS — D5700 Hb-SS disease with crisis, unspecified: Secondary | ICD-10-CM | POA: Diagnosis not present

## 2022-10-11 MED ORDER — HYDROMORPHONE 1 MG/ML IV SOLN
INTRAVENOUS | Status: DC
  Administered 2022-10-11: 4 mg via INTRAVENOUS
  Administered 2022-10-11: 0.5 mg via INTRAVENOUS
  Filled 2022-10-11 (×3): qty 30

## 2022-10-11 NOTE — Progress Notes (Signed)
Provided discharge education/instructions, all questions and concerns addressed. Pt not in acute distress. Pt to discharge home with belongings.

## 2022-10-11 NOTE — Progress Notes (Signed)
  Transition of Care [facility]) Screening Note   Patient Details  Name: Hannah Hawkins Date of Birth: 08-21-1986   Transition of Care [facility]) CM/SW Contact:    Lennart Pall, LCSW Phone Number: 10/11/2022, 12:39 PM    Transition of Care Department [facility]) has reviewed patient and no TOC needs have been identified at this time. We will continue to monitor patient advancement through interdisciplinary progression rounds. If new patient transition needs arise, please place a TOC consult.

## 2022-10-11 NOTE — Progress Notes (Signed)
Wasted 2 mL of HYDROmorphone into the stericycle. Charge RN Senaida Ores witnessed.

## 2022-10-11 NOTE — Plan of Care (Signed)
  Problem: Education: Goal: Knowledge of vaso-occlusive preventative measures will improve Outcome: Progressing   Problem: Self-Care: Goal: Ability to incorporate actions that prevent/reduce pain crisis will improve Outcome: Progressing   Problem: Tissue Perfusion: Goal: Complications related to inadequate tissue perfusion will be avoided or minimized Outcome: Progressing   Problem: Respiratory: Goal: Pulmonary complications will be avoided or minimized Outcome: Progressing   Problem: Fluid Volume: Goal: Ability to maintain a balanced intake and output will improve Outcome: Progressing   Problem: Sensory: Goal: Pain level will decrease with appropriate interventions Outcome: Progressing   Problem: Health Behavior: Goal: Postive changes in compliance with treatment and prescription regimens will improve Outcome: Progressing

## 2022-10-11 NOTE — Progress Notes (Signed)
Wasted 60mL of Dilaudid into the stericylce, witnessed by [employer]LPN.
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
## 2022-10-21 ENCOUNTER — Telehealth: Payer: Self-pay | Admitting: Family Medicine

## 2022-10-21 NOTE — Telephone Encounter (Signed)
Oxycodone  °

## 2022-10-22 ENCOUNTER — Other Ambulatory Visit: Payer: Self-pay | Admitting: Nurse Practitioner

## 2022-10-22 DIAGNOSIS — D5720 Sickle-cell/Hb-C disease without crisis: Secondary | ICD-10-CM

## 2022-10-22 DIAGNOSIS — G894 Chronic pain syndrome: Secondary | ICD-10-CM

## 2022-10-22 MED ORDER — OXYCODONE HCL 15 MG PO TABS: 15.0000 mg | ORAL_TABLET | Freq: Four times a day (QID) | ORAL | 0 refills | Status: DC | PRN

## 2022-10-22 NOTE — Progress Notes (Signed)
PDMP reviewed oxycodone refilled.

## 2022-10-23 ENCOUNTER — Other Ambulatory Visit: Payer: Self-pay

## 2022-10-23 ENCOUNTER — Emergency Department (HOSPITAL_COMMUNITY)
Admission: EM | Admit: 2022-10-23 | Discharge: 2022-10-23 | Disposition: A | Discharge status: Home / Self Care | Payer: Medicaid Other | Attending: Emergency Medicine | Admitting: Emergency Medicine

## 2022-10-23 ENCOUNTER — Encounter (HOSPITAL_COMMUNITY): Payer: Self-pay | Admitting: Emergency Medicine

## 2022-10-23 DIAGNOSIS — M791 Myalgia, unspecified site: Secondary | ICD-10-CM | POA: Diagnosis present

## 2022-10-23 DIAGNOSIS — D5700 Hb-SS disease with crisis, unspecified: Secondary | ICD-10-CM | POA: Insufficient documentation

## 2022-10-23 LAB — COMPREHENSIVE METABOLIC PANEL
ALT: 19 U/L (ref 0–44)
AST: 23 U/L (ref 15–41)
Albumin: 4.4 g/dL (ref 3.5–5.0)
Alkaline Phosphatase: 50 U/L (ref 38–126)
Anion gap: 5 (ref 5–15)
BUN: 7 mg/dL (ref 6–20)
CO2: 24 mmol/L (ref 22–32)
Calcium: 9 mg/dL (ref 8.9–10.3)
Chloride: 108 mmol/L (ref 98–111)
Creatinine, Ser: 0.79 mg/dL (ref 0.44–1.00)
GFR, Estimated: >60 mL/min (ref >60)
Glucose, Bld: 96 mg/dL (ref 70–99)
Potassium: 3.3 mmol/L — ABNORMAL LOW (ref 3.5–5.1)
Sodium: 137 mmol/L (ref 135–145)
Total Bilirubin: 1.1 mg/dL (ref 0.3–1.2)
Total Protein: 7.7 g/dL (ref 6.5–8.1)

## 2022-10-23 LAB — RETICULOCYTES
Immature Retic Fract: 25.9 % — ABNORMAL HIGH (ref 2.3–15.9)
RBC.: 4.07 MIL/uL (ref 3.87–5.11)
Retic Count, Absolute: 130.6 10*3/uL (ref 19.0–186.0)
Retic Ct Pct: 3.2 % — ABNORMAL HIGH (ref 0.4–3.1)

## 2022-10-23 LAB — CBC WITH DIFFERENTIAL/PLATELET
Abs Immature Granulocytes: 0.03 10*3/uL (ref 0.00–0.07)
Basophils Absolute: 0.1 10*3/uL (ref 0.0–0.1)
Basophils Relative: 1 %
Eosinophils Absolute: 1.8 10*3/uL — ABNORMAL HIGH (ref 0.0–0.5)
Eosinophils Relative: 15 %
HCT: 29.8 % — ABNORMAL LOW (ref 36.0–46.0)
Hemoglobin: 11 g/dL — ABNORMAL LOW (ref 12.0–15.0)
Immature Granulocytes: 0 %
Lymphocytes Relative: 24 %
Lymphs Abs: 2.9 10*3/uL (ref 0.7–4.0)
MCH: 26.9 pg (ref 26.0–34.0)
MCHC: 36.9 g/dL — ABNORMAL HIGH (ref 30.0–36.0)
MCV: 72.9 fL — ABNORMAL LOW (ref 80.0–100.0)
Monocytes Absolute: 1.1 10*3/uL — ABNORMAL HIGH (ref 0.1–1.0)
Monocytes Relative: 9 %
Neutro Abs: 6 10*3/uL (ref 1.7–7.7)
Neutrophils Relative %: 51 %
Platelets: 390 10*3/uL (ref 150–400)
RBC: 4.09 MIL/uL (ref 3.87–5.11)
RDW: 12.5 % (ref 11.5–15.5)
WBC: 11.9 10*3/uL — ABNORMAL HIGH (ref 4.0–10.5)
nRBC: 1 % — ABNORMAL HIGH (ref 0.0–0.2)

## 2022-10-23 LAB — I-STAT BETA HCG BLOOD, ED (MC, WL, AP ONLY): I-stat hCG, quantitative: <5 m[IU]/mL (ref <5)

## 2022-10-23 MED ORDER — ONDANSETRON 8 MG PO TBDP
8.0000 mg | ORAL_TABLET | Freq: Three times a day (TID) | ORAL | Status: DC | PRN
  Administered 2022-10-23: 8 mg via ORAL
  Filled 2022-10-23: qty 1

## 2022-10-23 MED ORDER — KETOROLAC TROMETHAMINE 15 MG/ML IJ SOLN
15.0000 mg | INTRAMUSCULAR | Status: AC
  Administered 2022-10-23: 15 mg via INTRAVENOUS
  Filled 2022-10-23: qty 1

## 2022-10-23 MED ORDER — OXYCODONE HCL 5 MG PO TABS
15.0000 mg | ORAL_TABLET | Freq: Once | ORAL | Status: AC
  Administered 2022-10-23: 15 mg via ORAL
  Filled 2022-10-23: qty 3

## 2022-10-23 MED ORDER — HYDROMORPHONE HCL 2 MG/ML IJ SOLN
2.0000 mg | INTRAMUSCULAR | Status: AC
  Administered 2022-10-23: 2 mg via SUBCUTANEOUS
  Filled 2022-10-23: qty 1

## 2022-10-23 MED ORDER — DIPHENHYDRAMINE HCL 25 MG PO CAPS
25.0000 mg | ORAL_CAPSULE | ORAL | Status: DC | PRN
  Administered 2022-10-23: 25 mg via ORAL
  Filled 2022-10-23: qty 1

## 2022-10-23 NOTE — ED Provider Notes (Signed)
[place] DEPT Provider Note   CSN: [id number] Arrival date & time: 10/23/22  0240     History  Chief Complaint  Patient presents with   Sickle Cell Pain Crisis    Hannah Hawkins is a 36 y.o. female.  36 year old female with past medical history of sickle cell anemia presents with complaint of crisis with diffuse body pain.  She denies nausea, vomiting, fevers, chills, chest pain or shortness of breath.  States that she ran out of her Oxy on Thursday.  Pain started Friday.  Not able to pick up the refill that was called in yesterday until tomorrow.       Home Medications Prior to Admission medications   Medication Sig Start Date End Date Taking? Authorizing Provider  lidocaine (HM LIDOCAINE PATCH) 4 % Place 1 patch onto the skin daily. 09/22/22  Yes Fenton Foy, NP  NARCAN 4 MG/0.1ML LIQD nasal spray kit Place 4 mg into the nose once as needed (overdose). 12/22/17  Yes [provider]  oxyCODONE (ROXICODONE) 15 MG immediate release tablet Take 1 tablet (15 mg total) by mouth every 6 (six) hours as needed for pain. 10/22/22  Yes Fenton Foy, NP  fluticasone (FLONASE) 50 MCG/ACT nasal spray Place 2 sprays into both nostrils daily. Patient not taking: Reported on 10/05/2022 08/14/21   Vevelyn Francois, NP  montelukast (SINGULAIR) 10 MG tablet Take 1 tablet (10 mg total) by mouth at bedtime. Patient not taking: Reported on 10/05/2022 08/14/21   Vevelyn Francois, NP  norethindrone (MICRONOR) 0.35 MG tablet Take 1 tablet (0.35 mg total) by mouth daily. Patient not taking: Reported on 10/05/2022 09/08/21   Vevelyn Francois, NP  ondansetron (ZOFRAN) 8 MG tablet Take 1 tablet (8 mg total) by mouth every 8 (eight) hours as needed for nausea or vomiting. Patient not taking: Reported on 10/05/2022 05/19/22   Bo Merino I, NP  pantoprazole (PROTONIX) 40 MG tablet Take 1 tablet (40 mg total) by mouth daily. Patient not taking: Reported on  03/17/2022 09/07/21 09/07/22  Vevelyn Francois, NP  risperiDONE (RISPERDAL) 2 MG tablet Take 1 tablet (2 mg total) by mouth at bedtime. Patient not taking: Reported on 10/05/2022 05/10/22   Revonda Humphrey, NP      Allergies    Penicillins    Review of Systems   Review of Systems Negative except as per HPI Physical Exam Updated Vital Signs BP (!) 122/90   Pulse 84   Temp 98.4 F (36.9 C) (Oral)   Resp 16   Ht _0  (1.6 m)   Wt 58.1 kg   LMP 10/03/2022   SpO2 96%   BMI 22.67 kg/m  Physical Exam Vitals and nursing note reviewed.  Constitutional:      General: She is not in acute distress.    Appearance: She is well-developed. She is not diaphoretic.     Comments: Sleeping, arouses to verbal stimuli and subsequently awake.  HENT:     Head: Normocephalic and atraumatic.     Nose: Nose normal.     Mouth/Throat:     Mouth: Mucous membranes are moist.  Eyes:     Conjunctiva/sclera: Conjunctivae normal.  Cardiovascular:     Rate and Rhythm: Normal rate and regular rhythm.     Heart sounds: Normal heart sounds.  Pulmonary:     Effort: Pulmonary effort is normal.     Breath sounds: Normal breath sounds.  Abdominal:     Palpations: Abdomen is  soft.     Tenderness: There is no abdominal tenderness.  Musculoskeletal:     Right lower leg: No edema.     Left lower leg: No edema.  Skin:    General: Skin is warm and dry.     Findings: No erythema or rash.  Neurological:     Mental Status: She is alert and oriented to person, place, and time.  Psychiatric:        Behavior: Behavior normal.     ED Results / Procedures / Treatments   Labs (all labs ordered are listed, but only abnormal results are displayed) Labs Reviewed  COMPREHENSIVE METABOLIC PANEL - Abnormal; Notable for the following components:      Result Value   Potassium 3.3 (*)    All other components within normal limits  CBC WITH DIFFERENTIAL/PLATELET - Abnormal; Notable for the following components:   WBC  11.9 (*)    Hemoglobin 11.0 (*)    HCT 29.8 (*)    MCV 72.9 (*)    MCHC 36.9 (*)    nRBC 1.0 (*)    Monocytes Absolute 1.1 (*)    Eosinophils Absolute 1.8 (*)    All other components within normal limits  RETICULOCYTES - Abnormal; Notable for the following components:   Retic Ct Pct 3.2 (*)    Immature Retic Fract 25.9 (*)    All other components within normal limits  I-STAT BETA HCG BLOOD, ED ([facility], [facility], [facility] ONLY)    EKG None  Radiology No results found.  Procedures Procedures    Medications Ordered in ED Medications  diphenhydrAMINE (BENADRYL) capsule 25-50 mg (25 mg Oral Given 10/23/22 0544)  ondansetron (ZOFRAN-ODT) disintegrating tablet 8 mg (8 mg Oral Given 10/23/22 0544)  oxyCODONE (Oxy IR/ROXICODONE) immediate release tablet 15 mg (has no administration in time range)  ketorolac (TORADOL) 15 MG/ML injection 15 mg (15 mg Intravenous Given 10/23/22 0519)  HYDROmorphone (DILAUDID) injection 2 mg (2 mg Subcutaneous Given 10/23/22 0544)  HYDROmorphone (DILAUDID) injection 2 mg (2 mg Subcutaneous Given 10/23/22 0518)    ED Course/ Medical Decision Making/ A&P                           Medical Decision Making Amount and/or Complexity of Data Reviewed Labs: ordered.   36 year old female with history of sickle cell anemia presents with complaint of crisis with diffuse body pain.  Denies fevers, vomiting, chest pain or shortness of breath.  Denies swelling or redness of her joints.  On exam, patient is sleeping, wakes to verbal stimuli.  Nontoxic, in no distress.  Vitals reassuring, she is afebrile with O2 sat 100% on room air.  States that she is out of her pain medication and not able to refill yet.  Lab work reviewed, CBC with mild leukocytosis white count 11.9.  Hemoglobin 11.0.  Reticulocytes slightly elevated at 11.2 with immature reticulocytes of 25.9.  CMP with mild hypokalemia with potassium 3.3.  hCG negative.  Patient was given 2 doses of subcu Dilaudid with Toradol.  On  recheck, her pain has improved, she is agreeable to try p.o. home medication with hopeful plan for discharge.        Final Clinical Impression(s) / ED Diagnoses Final diagnoses:  Sickle cell anemia with pain [facility])    Rx / DC Orders ED Discharge Orders     None         Tacy Learn, PA-C 10/23/22 0700  Palumbo, April, MD 10/23/22 [phone]

## 2022-10-23 NOTE — ED Triage Notes (Signed)
Pt in via [facility] with sickle cell pain, mostly to ankles and forearms. Denies any sob or cp.  VS en route: 110/80 80 98% CBG 104 18

## 2022-10-23 NOTE — Discharge Instructions (Signed)
Follow-up with your care team 

## 2022-11-04 ENCOUNTER — Telehealth: Payer: Self-pay | Admitting: Nurse Practitioner

## 2022-11-04 NOTE — Telephone Encounter (Signed)
Oxycodone immed release Morphine

## 2022-11-05 ENCOUNTER — Other Ambulatory Visit: Payer: Self-pay | Admitting: Nurse Practitioner

## 2022-11-05 NOTE — Telephone Encounter (Signed)
She will only be getting the oxycodone. It is not due til Monday though. Thanks.

## 2022-11-08 ENCOUNTER — Ambulatory Visit (INDEPENDENT_AMBULATORY_CARE_PROVIDER_SITE_OTHER): Payer: Medicaid Other | Admitting: Nurse Practitioner

## 2022-11-08 ENCOUNTER — Other Ambulatory Visit: Payer: Self-pay | Admitting: Nurse Practitioner

## 2022-11-08 ENCOUNTER — Telehealth: Payer: Self-pay

## 2022-11-08 ENCOUNTER — Other Ambulatory Visit: Payer: Self-pay

## 2022-11-08 ENCOUNTER — Encounter: Payer: Self-pay | Admitting: Nurse Practitioner

## 2022-11-08 VITALS — BP 122/93 | HR 56 | Temp 97.9°F | Ht 63.0 in | Wt 122.2 lb

## 2022-11-08 DIAGNOSIS — G894 Chronic pain syndrome: Secondary | ICD-10-CM

## 2022-11-08 DIAGNOSIS — D5720 Sickle-cell/Hb-C disease without crisis: Secondary | ICD-10-CM

## 2022-11-08 DIAGNOSIS — J0110 Acute frontal sinusitis, unspecified: Secondary | ICD-10-CM

## 2022-11-08 MED ORDER — OXYCODONE HCL 15 MG PO TABS: 15.0000 mg | ORAL_TABLET | Freq: Four times a day (QID) | ORAL | 0 refills | Status: DC | PRN

## 2022-11-08 MED ORDER — AZELASTINE HCL 0.1 % NA SOLN: 2.0000 | Freq: Two times a day (BID) | NASAL | 3 refills | Status: DC

## 2022-11-08 MED ORDER — AZITHROMYCIN 250 MG PO TABS: ORAL_TABLET | ORAL | 0 refills | Status: AC

## 2022-11-08 MED ORDER — SALINE SPRAY 0.65 % NA SOLN: 1.0000 | NASAL | 0 refills | Status: DC | PRN

## 2022-11-08 MED ORDER — BENZONATATE 100 MG PO CAPS: 100.0000 mg | ORAL_CAPSULE | Freq: Two times a day (BID) | ORAL | 0 refills | Status: DC | PRN

## 2022-11-08 NOTE — Progress Notes (Signed)
Established Patient Office Visit  Subjective:  Patient ID: Hannah Hawkins, female    DOB: June 26, 1986  Age: 36 y.o. MRN: [id number]  CC:  Chief Complaint  Patient presents with   Cough    Not tested for covid and had the cough for three days     HPI Hannah Hawkins is a 36 y.o. female with past medical history of chronic sinusitis, GERD, Sickle cell Disease presents for complaint of productive cough with thick yellowish sputum, stuffy nose, sneezing,wheezing, SOB, body aches, sinus pressure for the past 7 days. She initially had fever bu not currently. She denies bloody sputum, CP. She did get vaccinated for the flu, states that she did not get the flu vaccine. She has tried Nyquil and dayquil but not relieve. Her daughters boyfriend whom she has been in contact with also has similar symptoms. She has tried flonase but it provides no relief.        Past Medical History:  Diagnosis Date   Sickle cell anemia ([place])     Past Surgical History:  Procedure Laterality Date   CHOLECYSTECTOMY N/A 06/03/2021   Procedure: LAPAROSCOPIC CHOLECYSTECTOMY;  Surgeon: Coralie Keens, MD;  Location: [facility];  Service: General;  Laterality: N/A;   EYE SURGERY  03/09/2017   left eye   EYE SURGERY Right 09/2019   right [place]     Family History  Problem Relation Age of Onset   Diabetes Father     Social History   Socioeconomic History   Marital status: Single    Spouse name: Not on file   Number of children: Not on file   Years of education: Not on file   Highest education level: Not on file  Occupational History   Not on file  Tobacco Use   Smoking status: Former    Types: Cigarettes   Smokeless tobacco: Never  Vaping Use   Vaping Use: Every day   Substances: Flavoring  Substance and Sexual Activity   Alcohol use: Not Currently   Drug use: Not Currently    Types: Marijuana   Sexual activity: Yes  Other Topics Concern   Not on file  Social History Narrative   Not on  file   Social Determinants of Health   Financial Resource Strain: Not on file  Food Insecurity: No Food Insecurity (10/09/2022)   Hunger Vital Sign    Worried About Running Out of Food in the Last Year: Never true    Ran Out of Food in the Last Year: Never true  Transportation Needs: No Transportation Needs (10/09/2022)   PRAPARE - Hydrologist (Medical): No    Lack of Transportation (Non-Medical): No  Physical Activity: Not on file  Stress: Not on file  Social Connections: Not on file  Intimate Partner Violence: Not At Risk (10/09/2022)   Humiliation, Afraid, Rape, and Kick questionnaire    Fear of Current or Ex-Partner: No    Emotionally Abused: No    Physically Abused: No    Sexually Abused: No    Outpatient Medications Prior to Visit  Medication Sig Dispense Refill   lidocaine (HM LIDOCAINE PATCH) 4 % Place 1 patch onto the skin daily. 15 patch 0   NARCAN 4 MG/0.1ML LIQD nasal spray kit Place 4 mg into the nose once as needed (overdose).  0   oxyCODONE (ROXICODONE) 15 MG immediate release tablet Take 1 tablet (15 mg total) by mouth every 6 (six) hours as needed for pain. 60 tablet  0   fluticasone (FLONASE) 50 MCG/ACT nasal spray Place 2 sprays into both nostrils daily. (Patient not taking: Reported on 10/05/2022) 16 g 2   montelukast (SINGULAIR) 10 MG tablet Take 1 tablet (10 mg total) by mouth at bedtime. (Patient not taking: Reported on 10/05/2022) 30 tablet 3   norethindrone (MICRONOR) 0.35 MG tablet Take 1 tablet (0.35 mg total) by mouth daily. (Patient not taking: Reported on 10/05/2022) 28 tablet 11   ondansetron (ZOFRAN) 8 MG tablet Take 1 tablet (8 mg total) by mouth every 8 (eight) hours as needed for nausea or vomiting. (Patient not taking: Reported on 10/05/2022) 20 tablet 0   pantoprazole (PROTONIX) 40 MG tablet Take 1 tablet (40 mg total) by mouth daily. (Patient not taking: Reported on 03/17/2022) 30 tablet 11   risperiDONE (RISPERDAL) 2  MG tablet Take 1 tablet (2 mg total) by mouth at bedtime. (Patient not taking: Reported on 10/05/2022) 14 tablet 0   No facility-administered medications prior to visit.    Allergies  Allergen Reactions   Penicillins Swelling and Rash    Has patient had a PCN reaction causing immediate rash, facial/tongue/throat swelling, SOB or lightheadedness with hypotension: Yes Has patient had a PCN reaction causing severe rash involving mucus membranes or skin necrosis: Yes Has patient had a PCN reaction that required hospitalization No Has patient had a PCN reaction occurring within the last 10 years: no If all of the above answers are "NO", then may proceed with Cephalosporin use.     ROS Review of Systems  Constitutional:  Negative for activity change, appetite change, chills, diaphoresis, fatigue and fever.  HENT:  Positive for rhinorrhea, sinus pressure and sneezing. Negative for facial swelling, mouth sores and nosebleeds.   Respiratory:  Positive for cough, shortness of breath and wheezing. Negative for apnea and chest tightness.   Cardiovascular:  Negative for chest pain, palpitations and leg swelling.  Musculoskeletal: Negative.   Neurological:  Negative for dizziness, facial asymmetry and light-headedness.  Psychiatric/Behavioral:  Negative for agitation, behavioral problems, confusion, self-injury and suicidal ideas. The patient is not nervous/anxious.       Objective:    Physical Exam HENT:     Head: Normocephalic and atraumatic.     Right Ear: Tympanic membrane, ear canal and external ear normal. There is no impacted cerumen.     Left Ear: Tympanic membrane, ear canal and external ear normal. There is no impacted cerumen.     Nose: Congestion and rhinorrhea present.     Mouth/Throat:     Mouth: Mucous membranes are moist.     Pharynx: No oropharyngeal exudate or posterior oropharyngeal erythema.  Eyes:     General:        Right eye: No discharge.        Left eye: No  discharge.     Extraocular Movements: Extraocular movements intact.     Pupils: Pupils are equal, round, and reactive to light.  Cardiovascular:     Rate and Rhythm: Normal rate and regular rhythm.     Pulses: Normal pulses.     Heart sounds: Normal heart sounds. No murmur heard.    No friction rub. No gallop.  Pulmonary:     Effort: No respiratory distress.     Breath sounds: Normal breath sounds. No stridor. No wheezing, rhonchi or rales.  Chest:     Chest wall: No tenderness.  Abdominal:     General: There is no distension.     Palpations: Abdomen is soft.  Tenderness: There is no abdominal tenderness.  Musculoskeletal:        General: No swelling, tenderness, deformity or signs of injury.     Right lower leg: No edema.     Left lower leg: No edema.  Neurological:     Mental Status: She is alert and oriented to person, place, and time.     Cranial Nerves: No cranial nerve deficit.     Motor: No weakness.     Gait: Gait normal.     Deep Tendon Reflexes: Reflexes normal.  Psychiatric:        Mood and Affect: Mood normal.        Behavior: Behavior normal.        Thought Content: Thought content normal.        Judgment: Judgment normal.     BP (!) 122/93   Pulse (!) 56   Temp 97.9 F (36.6 C)   Ht _0  (1.6 m)   Wt 122 lb 3.2 oz (55.4 kg)   LMP 10/26/2022   SpO2 100%   BMI 21.65 kg/m  Wt Readings from Last 3 Encounters:  11/08/22 122 lb 3.2 oz (55.4 kg)  10/23/22 128 lb (58.1 kg)  10/08/22 120 lb (54.4 kg)    Lab Results  Component Value Date   TSH 1.940 07/24/2020   Lab Results  Component Value Date   WBC 11.9 (H) 10/23/2022   HGB 11.0 (L) 10/23/2022   HCT 29.8 (L) 10/23/2022   MCV 72.9 (L) 10/23/2022   PLT 390 10/23/2022   Lab Results  Component Value Date   NA 137 10/23/2022   K 3.3 (L) 10/23/2022   CO2 24 10/23/2022   GLUCOSE 96 10/23/2022   BUN 7 10/23/2022   CREATININE 0.79 10/23/2022   BILITOT 1.1 10/23/2022   ALKPHOS 50 10/23/2022    AST 23 10/23/2022   ALT 19 10/23/2022   PROT 7.7 10/23/2022   ALBUMIN 4.4 10/23/2022   CALCIUM 9.0 10/23/2022   ANIONGAP 5 10/23/2022   EGFR 115 09/22/2022   Lab Results  Component Value Date   CHOL 113 08/07/2020   Lab Results  Component Value Date   HDL 38 (L) 08/07/2020   Lab Results  Component Value Date   LDLCALC 65 08/07/2020   Lab Results  Component Value Date   TRIG 52 08/07/2020   Lab Results  Component Value Date   CHOLHDL 3.0 08/07/2020   No results found for: "HGBA1C"    Assessment & Plan:   Problem List Items Addressed This Visit       Respiratory   Acute frontal sinusitis - Primary    1. Acute frontal sinusitis, recurrence not specified  - azithromycin (ZITHROMAX) 250 MG tablet; Take 2 tablets on day 1, then 1 tablet daily on days 2 through 5  Dispense: 6 tablet; Refill: 0 - benzonatate (TESSALON) 100 MG capsule; Take 1 capsule (100 mg total) by mouth 2 (two) times daily as needed for cough.  Dispense: 20 capsule; Refill: 0 - azelastine (ASTELIN) 0.1 % nasal spray; Place 2 sprays into both nostrils 2 (two) times daily. Use in each nostril as directed  Dispense: 30 mL; Refill: 3 - sodium chloride (OCEAN) 0.65 % SOLN nasal spray; Place 1 spray into both nostrils as needed for congestion.  Dispense: 30 mL; Refill: 0   Take tylenol as needed fro fever Drink at least 64 ounces of water daily to maintain hydration.       Relevant Medications  azithromycin (ZITHROMAX) 250 MG tablet   benzonatate (TESSALON) 100 MG capsule   azelastine (ASTELIN) 0.1 % nasal spray   sodium chloride (OCEAN) 0.65 % SOLN nasal spray    Meds ordered this encounter  Medications   azithromycin (ZITHROMAX) 250 MG tablet    Sig: Take 2 tablets on day 1, then 1 tablet daily on days 2 through 5    Dispense:  6 tablet    Refill:  0   benzonatate (TESSALON) 100 MG capsule    Sig: Take 1 capsule (100 mg total) by mouth 2 (two) times daily as needed for cough.    Dispense:  20  capsule    Refill:  0   azelastine (ASTELIN) 0.1 % nasal spray    Sig: Place 2 sprays into both nostrils 2 (two) times daily. Use in each nostril as directed    Dispense:  30 mL    Refill:  3   sodium chloride (OCEAN) 0.65 % SOLN nasal spray    Sig: Place 1 spray into both nostrils as needed for congestion.    Dispense:  30 mL    Refill:  0    Follow-up: No follow-ups on file.    Renee Rival, FNP

## 2022-11-08 NOTE — Progress Notes (Signed)
PDMP reviewed. Pain medication refilled.

## 2022-11-08 NOTE — Patient Instructions (Addendum)
1. Acute frontal sinusitis, recurrence not specified  - azithromycin (ZITHROMAX) 250 MG tablet; Take 2 tablets on day 1, then 1 tablet daily on days 2 through 5  Dispense: 6 tablet; Refill: 0 - benzonatate (TESSALON) 100 MG capsule; Take 1 capsule (100 mg total) by mouth 2 (two) times daily as needed for cough.  Dispense: 20 capsule; Refill: 0 - azelastine (ASTELIN) 0.1 % nasal spray; Place 2 sprays into both nostrils 2 (two) times daily. Use in each nostril as directed  Dispense: 30 mL; Refill: 3 - sodium chloride (OCEAN) 0.65 % SOLN nasal spray; Place 1 spray into both nostrils as needed for congestion.  Dispense: 30 mL; Refill: 0     Thanks for choosing [facility] we consider it a privelige to serve you.

## 2022-11-08 NOTE — Telephone Encounter (Signed)
Oxycodone prior auth submitted today on [employer] Confirmation #[id number]

## 2022-11-08 NOTE — Assessment & Plan Note (Signed)
1. Acute frontal sinusitis, recurrence not specified  - azithromycin (ZITHROMAX) 250 MG tablet; Take 2 tablets on day 1, then 1 tablet daily on days 2 through 5  Dispense: 6 tablet; Refill: 0 - benzonatate (TESSALON) 100 MG capsule; Take 1 capsule (100 mg total) by mouth 2 (two) times daily as needed for cough.  Dispense: 20 capsule; Refill: 0 - azelastine (ASTELIN) 0.1 % nasal spray; Place 2 sprays into both nostrils 2 (two) times daily. Use in each nostril as directed  Dispense: 30 mL; Refill: 3 - sodium chloride (OCEAN) 0.65 % SOLN nasal spray; Place 1 spray into both nostrils as needed for congestion.  Dispense: 30 mL; Refill: 0   Take tylenol as needed fro fever Drink at least 64 ounces of water daily to maintain hydration.

## 2022-11-09 ENCOUNTER — Other Ambulatory Visit: Payer: Self-pay

## 2022-11-09 NOTE — Telephone Encounter (Signed)
Prior authorization approved until 02/06/2023

## 2022-11-18 ENCOUNTER — Other Ambulatory Visit: Payer: Self-pay

## 2022-11-18 ENCOUNTER — Emergency Department (HOSPITAL_COMMUNITY)
Admission: EM | Admit: 2022-11-18 | Discharge: 2022-11-19 | Disposition: A | Discharge status: Home / Self Care | Payer: Medicaid Other | Attending: Emergency Medicine | Admitting: Emergency Medicine

## 2022-11-18 ENCOUNTER — Encounter (HOSPITAL_COMMUNITY): Payer: Self-pay

## 2022-11-18 DIAGNOSIS — J069 Acute upper respiratory infection, unspecified: Secondary | ICD-10-CM | POA: Diagnosis not present

## 2022-11-18 DIAGNOSIS — Z20822 Contact with and (suspected) exposure to covid-19: Secondary | ICD-10-CM | POA: Insufficient documentation

## 2022-11-18 DIAGNOSIS — R059 Cough, unspecified: Secondary | ICD-10-CM | POA: Diagnosis present

## 2022-11-18 NOTE — ED Triage Notes (Signed)
Pt reports with cough and congestion x 1 month. Pt has already seen her primary provider and been prescribed medications.

## 2022-11-18 NOTE — ED Provider Triage Note (Signed)
Emergency Medicine Provider Triage Evaluation Note  Hannah Hawkins , a 36 y.o. female  was evaluated in triage.  Patient complaining of congestion and cough.  Daughter with similar symptoms.  Says that she went to her PCP who "prescribed some medication that helped but everything came right back."  Review of Systems  Positive:  Negative:   Physical Exam  Ht 5\' 3"  (1.6 m)   Wt 62.6 kg   LMP 10/26/2022   BMI 24.45 kg/m  Gen:   Awake, no distress   Resp:  Normal effort  MSK:   Moves extremities without difficulty  Other:    Medical Decision Making  Medically screening exam initiated at 11:53 PM.  Appropriate orders placed.  Hannah Hawkins was informed that the remainder of the evaluation will be completed by another provider, this initial triage assessment does not replace that evaluation, and the importance of remaining in the ED until their evaluation is complete.  Offered discharge from the triage area but patient declined.  Understands that she and her daughter will go back to the lobby   Hannah Hawkins, Hannah Hawkins 11/18/22 2353

## 2022-11-19 ENCOUNTER — Other Ambulatory Visit: Payer: Self-pay | Admitting: Nurse Practitioner

## 2022-11-19 ENCOUNTER — Emergency Department (HOSPITAL_COMMUNITY): Payer: Medicaid Other

## 2022-11-19 ENCOUNTER — Telehealth: Payer: Self-pay | Admitting: Nurse Practitioner

## 2022-11-19 DIAGNOSIS — J0110 Acute frontal sinusitis, unspecified: Secondary | ICD-10-CM

## 2022-11-19 LAB — RESP PANEL BY RT-PCR (FLU A&B, COVID) ARPGX2
Influenza A by PCR: NEGATIVE
Influenza B by PCR: NEGATIVE
SARS Coronavirus 2 by RT PCR: NEGATIVE

## 2022-11-19 LAB — GROUP A STREP BY PCR: Group A Strep by PCR: NOT DETECTED

## 2022-11-19 LAB — PREGNANCY, URINE: Preg Test, Ur: NEGATIVE

## 2022-11-19 MED ORDER — LIDOCAINE VISCOUS HCL 2 % MT SOLN
15.0000 mL | Freq: Once | OROMUCOSAL | Status: AC
  Administered 2022-11-19: 15 mL via OROMUCOSAL
  Filled 2022-11-19: qty 15

## 2022-11-19 MED ORDER — LIDOCAINE VISCOUS HCL 2 % MT SOLN: 15.0000 mL | Freq: Four times a day (QID) | OROMUCOSAL | 0 refills | Status: AC | PRN

## 2022-11-19 MED ORDER — BENZONATATE 100 MG PO CAPS: 100.0000 mg | ORAL_CAPSULE | Freq: Two times a day (BID) | ORAL | 0 refills | Status: DC | PRN

## 2022-11-19 NOTE — Telephone Encounter (Signed)
Caller & Relationship to patient: patient    MRN #  [id number]   Call Back Number: [phone]  Date of Last Office Visit: 11/08/2022     Date of Next Office Visit: 12/23/2022    Medication(s) to be Refilled: Oxycodone and benzonatate -- pt states she has been prescribed the benzonatate from here before and was in the ER yesterday due to a cold so requesting more  Preferred Pharmacy: [facility]   ** Please notify patient to allow 48-72 hours to process** **Let patient know to contact pharmacy at the end of the day to make sure medication is ready. ** **If patient has not been seen in a year or longer, book an appointment **Advise to use MyChart for refill requests OR to contact their pharmacy

## 2022-11-19 NOTE — Discharge Instructions (Signed)
Likely a viral infection, recommend over-the-counter pain medications like ibuprofen Tylenol for fever and pain control, nasal decongestions like Flonase and Zyrtec, Mucinex for cough.  If not eating recommend supplementing with Gatorade to help with electrolyte supplementation. I have given you viscous lidocaine please take as prescribed for a sore throat.   Follow-up PCP for further evaluation.  Come back to the emergency department if you develop chest pain, shortness of breath, severe abdominal pain, uncontrolled nausea, vomiting, diarrhea.

## 2022-11-19 NOTE — ED Provider Notes (Signed)
[facility] DEPT Provider Note   CSN: [id number] Arrival date & time: 11/18/22  2322     History  Chief Complaint  Patient presents with   Cough   Nasal Congestion    Hannah Hawkins is a 36 y.o. female.  HPI   Patient with medical history including sickle cell presents  with complaints of URI-like symptoms, been going on for about a month's time, symptoms include nasal congestion, sore throat, slight nonproductive cough, general body aches, decrease in appetite, chills still tolerating p.o., no shortness of breath or chest pain no pleuritic chest pain, states that she has had some subjective fevers and chills, states that she went to her primary care doctor who started her on azithromycin, states she felt better after that but then her symptoms came back.  She notes that her daughter has recently been sick, she is not having any complaints at this time.    Home Medications Prior to Admission medications   Medication Sig Start Date End Date Taking? Authorizing Provider  lidocaine (XYLOCAINE) 2 % solution Use as directed 15 mLs in the mouth or throat every 6 (six) hours as needed for up to 4 days for mouth pain. 11/19/22 11/23/22 Yes Marcello Fennel, PA-C  azelastine (ASTELIN) 0.1 % nasal spray Place 2 sprays into both nostrils 2 (two) times daily. Use in each nostril as directed 11/08/22   Paseda, Dewaine Conger, FNP  benzonatate (TESSALON) 100 MG capsule Take 1 capsule (100 mg total) by mouth 2 (two) times daily as needed for cough. 11/08/22   Paseda, Dewaine Conger, FNP  fluticasone (FLONASE) 50 MCG/ACT nasal spray Place 2 sprays into both nostrils daily. Patient not taking: Reported on 10/05/2022 08/14/21   Vevelyn Francois, NP  lidocaine (HM LIDOCAINE PATCH) 4 % Place 1 patch onto the skin daily. 09/22/22   Fenton Foy, NP  montelukast (SINGULAIR) 10 MG tablet Take 1 tablet (10 mg total) by mouth at bedtime. Patient not taking: Reported on  10/05/2022 08/14/21   Vevelyn Francois, NP  [facility] 4 MG/0.1ML LIQD nasal spray kit Place 4 mg into the nose once as needed (overdose). 12/22/17   [provider]  norethindrone (MICRONOR) 0.35 MG tablet Take 1 tablet (0.35 mg total) by mouth daily. Patient not taking: Reported on 10/05/2022 09/08/21   Vevelyn Francois, NP  ondansetron (ZOFRAN) 8 MG tablet Take 1 tablet (8 mg total) by mouth every 8 (eight) hours as needed for nausea or vomiting. Patient not taking: Reported on 10/05/2022 05/19/22   Bo Merino I, NP  oxyCODONE (ROXICODONE) 15 MG immediate release tablet Take 1 tablet (15 mg total) by mouth every 6 (six) hours as needed for pain. 11/08/22   Fenton Foy, NP  pantoprazole (PROTONIX) 40 MG tablet Take 1 tablet (40 mg total) by mouth daily. Patient not taking: Reported on 03/17/2022 09/07/21 09/07/22  Vevelyn Francois, NP  risperiDONE (RISPERDAL) 2 MG tablet Take 1 tablet (2 mg total) by mouth at bedtime. Patient not taking: Reported on 10/05/2022 05/10/22   Revonda Humphrey, NP  sodium chloride (OCEAN) 0.65 % SOLN nasal spray Place 1 spray into both nostrils as needed for congestion. 11/08/22   Renee Rival, FNP      Allergies    Penicillins    Review of Systems   Review of Systems  Constitutional:  Positive for chills and fever.  HENT:  Positive for congestion.   Respiratory:  Positive for cough. Negative for shortness  of breath.   Cardiovascular:  Negative for chest pain.  Gastrointestinal:  Negative for abdominal pain.  Neurological:  Negative for headaches.    Physical Exam Updated Vital Signs BP (!) 124/94 (BP Location: Left Arm)   Pulse 88   Temp 98.7 F (37.1 C) (Oral)   Resp 16   Ht _0  (1.6 m)   Wt 62.6 kg   LMP 10/26/2022   SpO2 100%   BMI 24.45 kg/m  Physical Exam Vitals and nursing note reviewed.  Constitutional:      General: She is not in acute distress.    Appearance: She is not ill-appearing.  HENT:     Head: Normocephalic  and atraumatic.     Right Ear: Tympanic membrane, ear canal and external ear normal.     Left Ear: Tympanic membrane, ear canal and external ear normal.     Nose: No congestion.     Mouth/Throat:     Mouth: Mucous membranes are moist.     Pharynx: Oropharynx is clear. No oropharyngeal exudate or posterior oropharyngeal erythema.     Comments: No trismus no torticollis tongue uvula midline controlling oral secretions tonsils both equal symmetric bilaterally no submandibular swelling no muffled tone voice. Eyes:     Conjunctiva/sclera: Conjunctivae normal.  Cardiovascular:     Rate and Rhythm: Normal rate and regular rhythm.     Pulses: Normal pulses.     Heart sounds: No murmur heard.    No friction rub. No gallop.  Pulmonary:     Effort: No respiratory distress.     Breath sounds: No wheezing, rhonchi or rales.  Musculoskeletal:     Right lower leg: No edema.     Left lower leg: No edema.  Skin:    General: Skin is warm and dry.  Neurological:     Mental Status: She is alert.  Psychiatric:        Mood and Affect: Mood normal.     ED Results / Procedures / Treatments   Labs (all labs ordered are listed, but only abnormal results are displayed) Labs Reviewed  RESP PANEL BY RT-PCR (FLU A&B, COVID) [id number]  GROUP A STREP BY PCR  PREGNANCY, URINE    EKG None  Radiology DG Chest 2 View  Result Date: 11/19/2022 CLINICAL DATA:  Cough and congestion for 1 month EXAM: CHEST - 2 VIEW COMPARISON:  10/08/2022 FINDINGS: The heart size and mediastinal contours are within normal limits. Both lungs are clear. The visualized skeletal structures are unremarkable. IMPRESSION: No active cardiopulmonary disease. Electronically Signed   By: Inez Catalina M.D.   On: 11/19/2022 00:40    Procedures Procedures    Medications Ordered in ED Medications  lidocaine (XYLOCAINE) 2 % viscous mouth solution 15 mL (15 mLs Mouth/Throat Given 11/19/22 8099)    ED Course/ Medical Decision Making/ A&P                            Medical Decision Making Risk Prescription drug management.   This patient presents to the ED for concern of URI-like symptoms, this involves an extensive number of treatment options, and is a complaint that carries with it a high risk of complications and morbidity.  The differential diagnosis includes PE, pneumonia, URI    Additional history obtained:  Additional history obtained from daughter at bedside External records from outside source obtained and reviewed including primary care notes   Co morbidities that complicate the patient  evaluation  Sickle cell  Social Determinants of Health:  N/A    Lab Tests:  I Ordered, and personally interpreted labs.  The pertinent results include: Urine pregnancy negative, respiratory panel negative, strep negative    Imaging Studies ordered:  I ordered imaging studies including chest x-ray I independently visualized and interpreted imaging which showed negative for acute findings I agree with the radiologist interpretation   Cardiac Monitoring:  The patient was maintained on a cardiac monitor.  I personally viewed and interpreted the cardiac monitored which showed an underlying rhythm of: N/A   Medicines ordered and prescription drug management:  I ordered medication including viscous lidocaine I have reviewed the patients home medicines and have made adjustments as needed  Critical Interventions:  N/A   Reevaluation:  Presents with URI-like symptoms, treated obtain basic lab work imaging which I personally reviewed they are unremarkable, patient is endorsing a sore throat, will add on a strep test.     Consultations Obtained:  N/a    Test Considered:  N/a    Rule out Low suspicion for systemic infection as patient is nontoxic-appearing, vital signs reassuring, no obvious source infection noted on exam.  Low suspicion for pneumonia as lung sounds are clear bilaterally, x-ray did  not reveal any acute findings.  I have low suspicion for PE as patient denies pleuritic chest pain, shortness of breath, patient is PERC. low suspicion for strep throat as oropharynx was visualized, no erythema or exudates noted strep test negative.  Low suspicion patient would need  hospitalized due to viral infection or Covid as vital signs reassuring, patient is not in respiratory distress.      Dispostion and problem list  After consideration of the diagnostic results and the patients response to treatment, I feel that the patent would benefit from DC.  URI-likely viral in nature especially since her daughter tested positive for RSV, recommend over-the-counter medications, follow-up with PCP for further evaluation and strict return precautions.            Final Clinical Impression(s) / ED Diagnoses Final diagnoses:  Viral upper respiratory tract infection    Rx / DC Orders ED Discharge Orders          Ordered    lidocaine (XYLOCAINE) 2 % solution  Every 6 hours PRN        11/19/22 0321              Marcello Fennel, PA-C 11/19/22 0416    Molpus, Jenny Reichmann, MD 11/19/22 [phone]

## 2022-11-22 ENCOUNTER — Other Ambulatory Visit: Payer: Self-pay | Admitting: Nurse Practitioner

## 2022-11-22 DIAGNOSIS — G894 Chronic pain syndrome: Secondary | ICD-10-CM

## 2022-11-22 DIAGNOSIS — D5720 Sickle-cell/Hb-C disease without crisis: Secondary | ICD-10-CM

## 2022-11-22 MED ORDER — OXYCODONE HCL 15 MG PO TABS: 15.0000 mg | ORAL_TABLET | Freq: Four times a day (QID) | ORAL | 0 refills | Status: DC | PRN

## 2022-11-29 ENCOUNTER — Telehealth: Payer: Self-pay

## 2022-11-29 NOTE — Telephone Encounter (Signed)
Transition Care Management Unsuccessful Follow-up Telephone Call  Date of discharge and from where:  11/19/22  Attempts:  1st Attempt  Reason for unsuccessful TCM follow-up call:  Left voice message  Renelda Loma RMA

## 2022-12-01 ENCOUNTER — Telehealth: Payer: Self-pay

## 2022-12-01 NOTE — Telephone Encounter (Signed)
Transition Care Management Follow-up Telephone Call Date of discharge and from where: 11/19/22 How have you been since you were released from the hospital? Per pt she is fine Any questions or concerns? No  Items Reviewed: Did the pt receive and understand the discharge instructions provided? Yes  Medications obtained and verified? Yes  Other? No  Any new allergies since your discharge? No  Dietary orders reviewed? No Do you have support at home? Yes    Follow up appointments reviewed:  PCP Hospital f/u appt confirmed? Yes  Scheduled to see Tonya on 12/02/22 @ 10 am. [facility] f/u appt confirmed? No   Are transportation arrangements needed? No  If their condition worsens, is the pt aware to call PCP or go to the Emergency Dept.? Yes Was the patient provided with contact information for the PCP's office or ED? Yes Was to pt encouraged to call back with questions or concerns? Yes   Renelda Loma RMA

## 2022-12-02 ENCOUNTER — Ambulatory Visit (INDEPENDENT_AMBULATORY_CARE_PROVIDER_SITE_OTHER): Payer: Medicaid Other | Admitting: Nurse Practitioner

## 2022-12-02 ENCOUNTER — Encounter: Payer: Self-pay | Admitting: Nurse Practitioner

## 2022-12-02 VITALS — BP 117/101 | HR 66 | Ht 63.0 in | Wt 128.0 lb

## 2022-12-02 DIAGNOSIS — D5720 Sickle-cell/Hb-C disease without crisis: Secondary | ICD-10-CM

## 2022-12-02 DIAGNOSIS — E559 Vitamin D deficiency, unspecified: Secondary | ICD-10-CM | POA: Diagnosis not present

## 2022-12-02 MED ORDER — OXYCODONE HCL ER 10 MG PO T12A: 10.0000 mg | EXTENDED_RELEASE_TABLET | Freq: Two times a day (BID) | ORAL | 0 refills | Status: DC

## 2022-12-02 NOTE — Assessment & Plan Note (Signed)
-   Sickle Cell Panel - Drug Screen 11 w/Conf, Ser - oxyCODONE (OXYCONTIN) 10 mg 12 hr tablet; Take 1 tablet (10 mg total) by mouth every 12 (twelve) hours for 15 days.  Dispense: 30 tablet; Refill: 0  2. Vitamin D deficiency  - Sickle Cell Panel - Drug Screen 11 w/Conf, Ser    Follow up:  Follow up in 3 months

## 2022-12-02 NOTE — Progress Notes (Signed)
_0  ID: Hannah Hawkins, female    DOB: 03/13/1986, 36 y.o.   MRN: [id number]  Chief Complaint  Patient presents with   Medication Problem    Pt stated oxycodone ER--hot flash, not sleeping especially when strectching it out.    Referring provider: Fenton Foy, NP   HPI  Hannah Hawkins 36 y.o. female  has a past medical history of Sickle cell anemia ([place]).    Patient presents today for follow-up visit.  Patient does have a history of sickle cell.  She states that everything has been going well.  Patient is currently taking Roxicodone IR every 6 hours.  She states that this is not working for her anymore and that she is having breakthrough pain flashes in between doses.  I have discussed this with Dr. Doreene Burke and we will add OxyContin 10 mg every 12 hours standing. Denies f/c/s, n/v/d, hemoptysis, PND, leg swelling Denies chest pain or edema       Allergies  Allergen Reactions   Penicillins Swelling and Rash    Has patient had a PCN reaction causing immediate rash, facial/tongue/throat swelling, SOB or lightheadedness with hypotension: Yes Has patient had a PCN reaction causing severe rash involving mucus membranes or skin necrosis: Yes Has patient had a PCN reaction that required hospitalization No Has patient had a PCN reaction occurring within the last 10 years: no If all of the above answers are "NO", then may proceed with Cephalosporin use.     Immunization History  Administered Date(s) Administered   Pneumococcal Polysaccharide-23 02/18/2017   Tdap 02/18/2017    Past Medical History:  Diagnosis Date   Sickle cell anemia ([place])     Tobacco History: Social History   Tobacco Use  Smoking Status Former   Types: Cigarettes  Smokeless Tobacco Never   Counseling given: Not Answered   Outpatient Encounter Medications as of 12/02/2022  Medication Sig   azelastine (ASTELIN) 0.1 % nasal spray Place 2 sprays into both nostrils 2 (two) times daily. Use  in each nostril as directed   benzonatate (TESSALON) 100 MG capsule Take 1 capsule (100 mg total) by mouth 2 (two) times daily as needed for cough.   fluticasone (FLONASE) 50 MCG/ACT nasal spray Place 2 sprays into both nostrils daily.   lidocaine (HM LIDOCAINE PATCH) 4 % Place 1 patch onto the skin daily.   montelukast (SINGULAIR) 10 MG tablet Take 1 tablet (10 mg total) by mouth at bedtime.   NARCAN 4 MG/0.1ML LIQD nasal spray kit Place 4 mg into the nose once as needed (overdose).   norethindrone (MICRONOR) 0.35 MG tablet Take 1 tablet (0.35 mg total) by mouth daily.   ondansetron (ZOFRAN) 8 MG tablet Take 1 tablet (8 mg total) by mouth every 8 (eight) hours as needed for nausea or vomiting.   oxyCODONE (OXYCONTIN) 10 mg 12 hr tablet Take 1 tablet (10 mg total) by mouth every 12 (twelve) hours for 15 days.   oxyCODONE (ROXICODONE) 15 MG immediate release tablet Take 1 tablet (15 mg total) by mouth every 6 (six) hours as needed for pain.   risperiDONE (RISPERDAL) 2 MG tablet Take 1 tablet (2 mg total) by mouth at bedtime.   sodium chloride (OCEAN) 0.65 % SOLN nasal spray Place 1 spray into both nostrils as needed for congestion.   pantoprazole (PROTONIX) 40 MG tablet Take 1 tablet (40 mg total) by mouth daily. (Patient not taking: Reported on 03/17/2022)   No facility-administered encounter medications on file as of 12/02/2022.  Review of Systems  Review of Systems  Constitutional: Negative.   HENT: Negative.    Cardiovascular: Negative.   Gastrointestinal: Negative.   Allergic/Immunologic: Negative.   Neurological: Negative.   Psychiatric/Behavioral: Negative.         Physical Exam  BP (!) 117/101   Pulse 66   Ht _0  (1.6 m)   Wt 128 lb (58.1 kg)   LMP 10/26/2022   SpO2 99%   BMI 22.67 kg/m   Wt Readings from Last 5 Encounters:  12/02/22 128 lb (58.1 kg)  11/18/22 138 lb (62.6 kg)  11/08/22 122 lb 3.2 oz (55.4 kg)  10/23/22 128 lb (58.1 kg)  10/08/22 120 lb  (54.4 kg)     Physical Exam Vitals and nursing note reviewed.  Constitutional:      General: She is not in acute distress.    Appearance: She is well-developed.  Cardiovascular:     Rate and Rhythm: Normal rate and regular rhythm.  Pulmonary:     Effort: Pulmonary effort is normal.     Breath sounds: Normal breath sounds.  Neurological:     Mental Status: She is alert and oriented to person, place, and time.      Lab Results:  CBC    Component Value Date/Time   WBC 11.9 (H) 10/23/2022 0254   RBC 4.09 10/23/2022 0254   RBC 4.07 10/23/2022 0254   HGB 11.0 (L) 10/23/2022 0254   HGB 11.0 (L) 09/22/2022 1530   HCT 29.8 (L) 10/23/2022 0254   HCT 32.8 (L) 09/22/2022 1530   PLT 390 10/23/2022 0254   PLT 329 09/22/2022 1530   MCV 72.9 (L) 10/23/2022 0254   MCV 84 09/22/2022 1530   MCH 26.9 10/23/2022 0254   MCHC 36.9 (H) 10/23/2022 0254   RDW 12.5 10/23/2022 0254   RDW 14.0 09/22/2022 1530   LYMPHSABS 2.9 10/23/2022 0254   LYMPHSABS 2.2 09/22/2022 1530   MONOABS 1.1 (H) 10/23/2022 0254   EOSABS 1.8 (H) 10/23/2022 0254   EOSABS 0.4 09/22/2022 1530   BASOSABS 0.1 10/23/2022 0254   BASOSABS 0.1 09/22/2022 1530    BMET    Component Value Date/Time   NA 137 10/23/2022 0254   NA 139 09/22/2022 1530   K 3.3 (L) 10/23/2022 0254   CL 108 10/23/2022 0254   CO2 24 10/23/2022 0254   GLUCOSE 96 10/23/2022 0254   BUN 7 10/23/2022 0254   BUN 5 (L) 09/22/2022 1530   CREATININE 0.79 10/23/2022 0254   CREATININE 0.69 08/04/2017 1208   CALCIUM 9.0 10/23/2022 0254   GFRNONAA >60 10/23/2022 0254   GFRNONAA >89 08/04/2017 1208   GFRAA >60 09/23/2020 0658   GFRAA >89 08/04/2017 1208    BNP    Component Value Date/Time   BNP 27.4 03/07/2020 1532    ProBNP No results found for: "PROBNP"  Imaging: DG Chest 2 View  Result Date: 11/19/2022 CLINICAL DATA:  Cough and congestion for 1 month EXAM: CHEST - 2 VIEW COMPARISON:  10/08/2022 FINDINGS: The heart size and mediastinal  contours are within normal limits. Both lungs are clear. The visualized skeletal structures are unremarkable. IMPRESSION: No active cardiopulmonary disease. Electronically Signed   By: Inez Catalina M.D.   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  Dispense: 30 tablet; Refill: 0  2. Vitamin D deficiency  - Sickle Cell Panel - Drug Screen 11 w/Conf, Ser    Follow up:  Follow up in 3 months      Fenton Foy, NP 12/02/2022

## 2022-12-02 NOTE — Telephone Encounter (Signed)
Error

## 2022-12-02 NOTE — Patient Instructions (Addendum)
1. Sickle cell-hemoglobin C disease without crisis (HCC)  - Sickle Cell Panel - Drug Screen 11 w/Conf, Ser - oxyCODONE (OXYCONTIN) 10 mg 12 hr tablet; Take 1 tablet (10 mg total) by mouth every 12 (twelve) hours for 15 days.  Dispense: 30 tablet; Refill: 0  2. Vitamin D deficiency  - Sickle Cell Panel - Drug Screen 11 w/Conf, Ser    Follow up:  Follow up in 3 months

## 2022-12-03 ENCOUNTER — Telehealth: Payer: Self-pay

## 2022-12-03 ENCOUNTER — Other Ambulatory Visit: Payer: Self-pay

## 2022-12-03 NOTE — Telephone Encounter (Signed)
Approved until 03/03/23

## 2022-12-03 NOTE — Telephone Encounter (Signed)
Prior auth for Oxycontin submitted via nctracks confirmation [id number]

## 2022-12-06 ENCOUNTER — Other Ambulatory Visit: Payer: Self-pay | Admitting: Nurse Practitioner

## 2022-12-06 ENCOUNTER — Telehealth: Payer: Self-pay | Admitting: Nurse Practitioner

## 2022-12-06 MED ORDER — VITAMIN D (ERGOCALCIFEROL) 1.25 MG (50000 UNIT) PO CAPS: 50000.0000 [IU] | ORAL_CAPSULE | ORAL | 2 refills | Status: DC

## 2022-12-06 NOTE — Telephone Encounter (Signed)
Caller & Relationship to patient:  MRN #  [id number]   Call Back Number: [phone]  Date of Last Office Visit: 12/02/2022     Date of Next Office Visit: 03/04/2023    Medication(s) to be Refilled: oxycodone  Preferred Pharmacy: [facility] on [address]   ** Please notify patient to allow 48-72 hours to process** **Let patient know to contact pharmacy at the end of the day to make sure medication is ready. ** **If patient has not been seen in a year or longer, book an appointment **Advise to use MyChart for refill requests OR to contact their pharmacy

## 2022-12-07 ENCOUNTER — Other Ambulatory Visit: Payer: Self-pay

## 2022-12-07 DIAGNOSIS — D5720 Sickle-cell/Hb-C disease without crisis: Secondary | ICD-10-CM

## 2022-12-07 DIAGNOSIS — G894 Chronic pain syndrome: Secondary | ICD-10-CM

## 2022-12-07 NOTE — Telephone Encounter (Signed)
Caller & Relationship to patient:  MRN #  [id number]   Call Back Number:   Date of Last Office Visit: 12/06/2022     Date of Next Office Visit: 03/04/2023    Medication(s) to be Refilled: Ibuprofen Zofran lidocaine    Preferred Pharmacy:   ** Please notify patient to allow 48-72 hours to process** **Let patient know to contact pharmacy at the end of the day to make sure medication is ready. ** **If patient has not been seen in a year or longer, book an appointment **Advise to use MyChart for refill requests OR to contact their pharmacy

## 2022-12-08 MED ORDER — OXYCODONE HCL 15 MG PO TABS: 15.0000 mg | ORAL_TABLET | Freq: Four times a day (QID) | ORAL | 0 refills | Status: DC | PRN

## 2022-12-08 MED ORDER — LIDOCAINE 4 % EX PTCH: 1.0000 | MEDICATED_PATCH | CUTANEOUS | 0 refills | Status: DC

## 2022-12-17 ENCOUNTER — Telehealth: Payer: Self-pay | Admitting: Nurse Practitioner

## 2022-12-17 ENCOUNTER — Other Ambulatory Visit: Payer: Self-pay | Admitting: Nurse Practitioner

## 2022-12-17 DIAGNOSIS — D5720 Sickle-cell/Hb-C disease without crisis: Secondary | ICD-10-CM

## 2022-12-17 MED ORDER — OXYCODONE HCL ER 10 MG PO T12A: 10.0000 mg | EXTENDED_RELEASE_TABLET | Freq: Two times a day (BID) | ORAL | 0 refills | Status: AC

## 2022-12-17 NOTE — Telephone Encounter (Signed)
Caller & Relationship to patient:  MRN #  [id number]   Call Back Number:   Date of Last Office Visit: 12/07/2022     Date of Next Office Visit: 03/04/2023    Medication(s) to be Refilled: Oxy contin extended release  Preferred Pharmacy:   ** Please notify patient to allow 48-72 hours to process** **Let patient know to contact pharmacy at the end of the day to make sure medication is ready. ** **If patient has not been seen in a year or longer, book an appointment **Advise to use MyChart for refill requests OR to contact their pharmacy

## 2022-12-21 ENCOUNTER — Telehealth: Payer: Self-pay | Admitting: Nurse Practitioner

## 2022-12-21 NOTE — Telephone Encounter (Signed)
Caller & Relationship to patient:  MRN #  [id number]   Call Back Number:   Date of Last Office Visit: 12/17/2022     Date of Next Office Visit: 03/04/2023    Medication(s) to be Refilled: Oxycodone   Preferred Pharmacy:   ** Please notify patient to allow 48-72 hours to process** **Let patient know to contact pharmacy at the end of the day to make sure medication is ready. ** **If patient has not been seen in a year or longer, book an appointment **Advise to use MyChart for refill requests OR to contact their pharmacy

## 2022-12-22 ENCOUNTER — Other Ambulatory Visit: Payer: Self-pay | Admitting: Nurse Practitioner

## 2022-12-23 ENCOUNTER — Other Ambulatory Visit: Payer: Self-pay | Admitting: Nurse Practitioner

## 2022-12-23 ENCOUNTER — Ambulatory Visit: Payer: Self-pay | Admitting: Nurse Practitioner

## 2022-12-23 DIAGNOSIS — G894 Chronic pain syndrome: Secondary | ICD-10-CM

## 2022-12-23 DIAGNOSIS — D5720 Sickle-cell/Hb-C disease without crisis: Secondary | ICD-10-CM

## 2022-12-23 MED ORDER — OXYCODONE HCL 15 MG PO TABS: 15.0000 mg | ORAL_TABLET | Freq: Four times a day (QID) | ORAL | 0 refills | Status: DC | PRN

## 2022-12-25 LAB — CMP14+CBC/D/PLT+FER+RETIC+V...
ALT: 6 IU/L (ref 0–32)
AST: 15 IU/L (ref 0–40)
Albumin/Globulin Ratio: 1.7 (ref 1.2–2.2)
Albumin: 4 g/dL (ref 3.9–4.9)
Alkaline Phosphatase: 52 IU/L (ref 44–121)
BUN/Creatinine Ratio: 15 (ref 9–23)
BUN: 10 mg/dL (ref 6–20)
Basophils Absolute: 0.1 10*3/uL (ref 0.0–0.2)
Basos: 2 %
Bilirubin Total: 0.5 mg/dL (ref 0.0–1.2)
CO2: 22 mmol/L (ref 20–29)
Calcium: 8.6 mg/dL — ABNORMAL LOW (ref 8.7–10.2)
Chloride: 102 mmol/L (ref 96–106)
Creatinine, Ser: 0.68 mg/dL (ref 0.57–1.00)
EOS (ABSOLUTE): 0.4 10*3/uL (ref 0.0–0.4)
Eos: 5 %
Ferritin: 78 ng/mL (ref 15–150)
Globulin, Total: 2.4 g/dL (ref 1.5–4.5)
Glucose: 113 mg/dL — ABNORMAL HIGH (ref 70–99)
Hematocrit: 31.5 % — ABNORMAL LOW (ref 34.0–46.6)
Hemoglobin: 10.6 g/dL — ABNORMAL LOW (ref 11.1–15.9)
Immature Grans (Abs): 0 10*3/uL (ref 0.0–0.1)
Immature Granulocytes: 0 %
Lymphocytes Absolute: 3.7 10*3/uL — ABNORMAL HIGH (ref 0.7–3.1)
Lymphs: 45 %
MCH: 27.6 pg (ref 26.6–33.0)
MCHC: 33.7 g/dL (ref 31.5–35.7)
MCV: 82 fL (ref 79–97)
Monocytes Absolute: 0.7 10*3/uL (ref 0.1–0.9)
Monocytes: 9 %
NRBC: 2 % — ABNORMAL HIGH (ref 0–0)
Neutrophils Absolute: 3.2 10*3/uL (ref 1.4–7.0)
Neutrophils: 39 %
Platelets: 425 10*3/uL (ref 150–450)
Potassium: 3.3 mmol/L — ABNORMAL LOW (ref 3.5–5.2)
RBC: 3.84 x10E6/uL (ref 3.77–5.28)
RDW: 15.5 % — ABNORMAL HIGH (ref 11.7–15.4)
Retic Ct Pct: 3.1 % — ABNORMAL HIGH (ref 0.6–2.6)
Sodium: 137 mmol/L (ref 134–144)
Total Protein: 6.4 g/dL (ref 6.0–8.5)
Vit D, 25-Hydroxy: 11.4 ng/mL — ABNORMAL LOW (ref 30.0–100.0)
WBC: 8.2 10*3/uL (ref 3.4–10.8)
eGFR: 114 mL/min/{1.73_m2} (ref >59)

## 2022-12-25 LAB — DRUG SCREEN 11 W/CONF, SE
Amphetamines, IA: NEGATIVE ng/mL
Barbiturates, IA: NEGATIVE ug/mL
Benzodiazepines, IA: NEGATIVE ng/mL
Cocaine & Metabolite, IA: POSITIVE ng/mL — AB
Ethyl Alcohol, Enz: NEGATIVE gm/dL
Methadone, IA: NEGATIVE ng/mL
Opiates, IA: NEGATIVE ng/mL
Oxycodones, IA: POSITIVE ng/mL — AB
Phencyclidine, IA: NEGATIVE ng/mL
Propoxyphene, IA: NEGATIVE ng/mL
THC(Marijuana) Metabolite, IA: NEGATIVE ng/mL

## 2022-12-25 LAB — OXYCODONES,MS,WB/SP RFX
Oxycocone: 17 ng/mL
Oxycodones Confirmation: POSITIVE
Oxymorphone: NEGATIVE ng/mL

## 2022-12-25 LAB — COCAINE,MS,WB/SP RFX
Benzoylecgonine: 90 ng/mL
Cocaine Confirmation: POSITIVE
Cocaine: NEGATIVE ng/mL

## 2022-12-29 ENCOUNTER — Telehealth: Payer: Self-pay | Admitting: Nurse Practitioner

## 2022-12-29 NOTE — Telephone Encounter (Signed)
My chart message to patient:  Hello,   I have been trying to get in touch with you by phone, but have not successful.Hannah KitchenMarland KitchenYour recent drug screen came back positive for cocaine. We would like to offer you treatment / rehab. If you are interested in this please contact the office. Also, with a history of cocaine abuse and recent positive test we will no longer be able to refill your pain medications.   Jacqulynn Cadet, FNP-C

## 2023-01-07 ENCOUNTER — Other Ambulatory Visit: Payer: Self-pay

## 2023-01-07 DIAGNOSIS — D5720 Sickle-cell/Hb-C disease without crisis: Secondary | ICD-10-CM

## 2023-01-07 DIAGNOSIS — G894 Chronic pain syndrome: Secondary | ICD-10-CM

## 2023-01-07 NOTE — Telephone Encounter (Signed)
From: [name] To: Office of Fenton Foy, NP Sent: 01/07/2023 12:23 PM EST Subject: Medication Renewal Request  Refills have been requested for the following medications:   oxyCODONE (ROXICODONE) 15 MG immediate release tablet Kenney Houseman [address]]  Preferred pharmacy: [id number] - [place], [place] - [address] AT [place] Delivery method: [employer]

## 2023-01-12 ENCOUNTER — Telehealth: Payer: Self-pay | Admitting: Nurse Practitioner

## 2023-01-12 ENCOUNTER — Ambulatory Visit (INDEPENDENT_AMBULATORY_CARE_PROVIDER_SITE_OTHER): Payer: Medicaid Other | Admitting: Nurse Practitioner

## 2023-01-12 ENCOUNTER — Encounter: Payer: Self-pay | Admitting: Nurse Practitioner

## 2023-01-12 VITALS — BP 104/62 | HR 75 | Temp 97.9°F | Ht 63.0 in | Wt 134.0 lb

## 2023-01-12 DIAGNOSIS — F1490 Cocaine use, unspecified, uncomplicated: Secondary | ICD-10-CM

## 2023-01-12 DIAGNOSIS — D5720 Sickle-cell/Hb-C disease without crisis: Secondary | ICD-10-CM | POA: Diagnosis not present

## 2023-01-12 DIAGNOSIS — R110 Nausea: Secondary | ICD-10-CM | POA: Diagnosis not present

## 2023-01-12 MED ORDER — IBUPROFEN 600 MG PO TABS: 600.0000 mg | ORAL_TABLET | Freq: Three times a day (TID) | ORAL | 0 refills | Status: DC | PRN

## 2023-01-12 MED ORDER — ONDANSETRON HCL 8 MG PO TABS: 8.0000 mg | ORAL_TABLET | Freq: Three times a day (TID) | ORAL | 0 refills | Status: DC | PRN

## 2023-01-12 NOTE — Telephone Encounter (Signed)
My chart message:  Hello,   Mrs. Hannah Hawkins I have discussed your case with Cammie Sickle. Due to previous history of cocaine use and recent positive drug test we will no longer refill pain medications at our office. We will refer you to Hematology for sickle cell management.   Lazaro Arms, FNP-C

## 2023-01-12 NOTE — Patient Instructions (Signed)
1. Cocaine use  Patient refused rehab  Patient refused repeat drug screen today.   2. Nausea  - ondansetron (ZOFRAN) 8 MG tablet; Take 1 tablet (8 mg total) by mouth every 8 (eight) hours as needed for nausea or vomiting.  Dispense: 20 tablet; Refill: 0  3. 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

## 2023-01-12 NOTE — Progress Notes (Signed)
@Patient  ID: , female    DOB: [birth year], 37 y.o.   MRN: [id number]  Chief Complaint  Patient presents with   Medication Refill    Asking for refill on pain med , ripserdone and zofran.    Referring provider: [id number], NP   HPI  Hannah Hawkins 37 y.o. female  has a past medical history of Sickle cell anemia (HCC).    Patient presents today for refill pain medicines.  We discussed that at her last visit on 12/02/2022 she did test positive for cocaine.  Patient does have past history of cocaine abuse.  Patient states that she only did cocaine that 1 time.  But she does refuse a drug screen today stating that she still has cocaine in her system today.  MyChart message was sent to patient stating that we will not refill any pain medicines going forward for this patient.  We will refer her to hematology through [facility] or [facility] for further sickle cell management.  Patient was offered rehab but refused today.  Patient was offered prescription strength ibuprofen to help manage pain.  Zofran was refilled today.  Patient has been medication management for sickle cell in the past and has refused. Denies f/c/s, n/v/d, hemoptysis, PND, leg swelling Denies chest pain or edema    Allergies  Allergen Reactions   Penicillins Swelling and Rash    Has patient had a PCN reaction causing immediate rash, facial/tongue/throat swelling, SOB or lightheadedness with hypotension: Yes Has patient had a PCN reaction causing severe rash involving mucus membranes or skin necrosis: Yes Has patient had a PCN reaction that required hospitalization No Has patient had a PCN reaction occurring within the last 10 years: no If all of the above answers are "NO", then may proceed with Cephalosporin use.     Immunization History  Administered Date(s) Administered   Pneumococcal Polysaccharide-23 02/18/2017   Tdap 02/18/2017    Past Medical History:  Diagnosis Date   Sickle cell anemia  (HCC)     Tobacco History: Social History   Tobacco Use  Smoking Status Former   Types: Cigarettes  Smokeless Tobacco Never   Counseling given: Not Answered   Outpatient Encounter Medications as of 01/12/2023  Medication Sig   azelastine (ASTELIN) 0.1 % nasal spray Place 2 sprays into both nostrils 2 (two) times daily. Use in each nostril as directed   benzonatate (TESSALON) 100 MG capsule Take 1 capsule (100 mg total) by mouth 2 (two) times daily as needed for cough.   fluticasone (FLONASE) 50 MCG/ACT nasal spray Place 2 sprays into both nostrils daily.   ibuprofen (ADVIL) 600 MG tablet Take 1 tablet (600 mg total) by mouth every 8 (eight) hours as needed.   NARCAN 4 MG/0.1ML LIQD nasal spray kit Place 4 mg into the nose once as needed (overdose).   oxyCODONE (ROXICODONE) 15 MG immediate release tablet Take 1 tablet (15 mg total) by mouth every 6 (six) hours as needed for pain.   sodium chloride (OCEAN) 0.65 % SOLN nasal spray Place 1 spray into both nostrils as needed for congestion.   Vitamin D, Ergocalciferol, (DRISDOL) 1.25 MG (50000 UNIT) CAPS capsule Take 1 capsule (50,000 Units total) by mouth every 7 (seven) days.   [DISCONTINUED] ondansetron (ZOFRAN) 8 MG tablet Take 1 tablet (8 mg total) by mouth every 8 (eight) hours as needed for nausea or vomiting.   lidocaine (HM LIDOCAINE PATCH) 4 % Place 1 patch onto the skin daily. (Patient not  taking: Reported on 01/12/2023)   montelukast (SINGULAIR) 10 MG tablet Take 1 tablet (10 mg total) by mouth at bedtime. (Patient not taking: Reported on 01/12/2023)   norethindrone (MICRONOR) 0.35 MG tablet Take 1 tablet (0.35 mg total) by mouth daily. (Patient not taking: Reported on 01/12/2023)   ondansetron (ZOFRAN) 8 MG tablet Take 1 tablet (8 mg total) by mouth every 8 (eight) hours as needed for nausea or vomiting.   pantoprazole (PROTONIX) 40 MG tablet Take 1 tablet (40 mg total) by mouth daily. (Patient not taking: Reported on 03/17/2022)    risperiDONE (RISPERDAL) 2 MG tablet Take 1 tablet (2 mg total) by mouth at bedtime. (Patient not taking: Reported on 01/12/2023)   No facility-administered encounter medications on file as of 01/12/2023.     Review of Systems  Review of Systems  Constitutional: Negative.   HENT: Negative.    Cardiovascular: Negative.   Gastrointestinal: Negative.   Allergic/Immunologic: Negative.   Neurological: Negative.   Psychiatric/Behavioral: Negative.         Physical Exam  BP 104/62   Pulse 75   Temp 97.9 F (36.6 C) (Temporal)   Ht 5\' 3"  (1.6 m)   Wt 134 lb (60.8 kg)   SpO2 100%   BMI 23.74 kg/m   Wt Readings from Last 5 Encounters:  01/12/23 134 lb (60.8 kg)  12/02/22 128 lb (58.1 kg)  11/18/22 138 lb (62.6 kg)  11/08/22 122 lb 3.2 oz (55.4 kg)  10/23/22 128 lb (58.1 kg)     Physical Exam Vitals and nursing note reviewed.  Constitutional:      General: She is not in acute distress.    Appearance: She is well-developed.  Cardiovascular:     Rate and Rhythm: Normal rate and regular rhythm.  Pulmonary:     Effort: Pulmonary effort is normal.     Breath sounds: Normal breath sounds.  Neurological:     Mental Status: She is alert and oriented to person, place, and time.      Lab Results:  CBC    Component Value Date/Time   WBC 8.2 12/02/2022 1057   WBC 11.9 (H) 10/23/2022 0254   RBC 3.84 12/02/2022 1057   RBC 4.09 10/23/2022 0254   RBC 4.07 10/23/2022 0254   HGB 10.6 (L) 12/02/2022 1057   HCT 31.5 (L) 12/02/2022 1057   PLT 425 12/02/2022 1057   MCV 82 12/02/2022 1057   MCH 27.6 12/02/2022 1057   MCH 26.9 10/23/2022 0254   MCHC 33.7 12/02/2022 1057   MCHC 36.9 (H) 10/23/2022 0254   RDW 15.5 (H) 12/02/2022 1057   LYMPHSABS 3.7 (H) 12/02/2022 1057   MONOABS 1.1 (H) 10/23/2022 0254   EOSABS 0.4 12/02/2022 1057   BASOSABS 0.1 12/02/2022 1057    BMET    Component Value Date/Time   NA 137 12/02/2022 1057   K 3.3 (L) 12/02/2022 1057   CL 102 12/02/2022  1057   CO2 22 12/02/2022 1057   GLUCOSE 113 (H) 12/02/2022 1057   GLUCOSE 96 10/23/2022 0254   BUN 10 12/02/2022 1057   CREATININE 0.68 12/02/2022 1057   CREATININE 0.69 08/04/2017 1208   CALCIUM 8.6 (L) 12/02/2022 1057   GFRNONAA >60 10/23/2022 0254   GFRNONAA >89 08/04/2017 1208   GFRAA >60 09/23/2020 0658   GFRAA >89 08/04/2017 1208    BNP    Component Value Date/Time   BNP 27.4 03/07/2020 1532    ProBNP No results found for: "PROBNP"  Imaging: No results found.  Assessment & Plan:   Sickle-cell/Hb-C disease [facility]) Patient refused rehab  Patient refused repeat drug screen today.   2. Nausea  - ondansetron (ZOFRAN) 8 MG tablet; Take 1 tablet (8 mg total) by mouth every 8 (eight) hours as needed for nausea or vomiting.  Dispense: 20 tablet; Refill: 0  3. 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

## 2023-01-12 NOTE — Assessment & Plan Note (Signed)
Patient refused rehab  Patient refused repeat drug screen today.   2. Nausea  - ondansetron (ZOFRAN) 8 MG tablet; Take 1 tablet (8 mg total) by mouth every 8 (eight) hours as needed for nausea or vomiting.  Dispense: 20 tablet; Refill: 0  3. 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

## 2023-01-21 ENCOUNTER — Non-Acute Institutional Stay (HOSPITAL_COMMUNITY)
Admission: AD | Admit: 2023-01-21 | Discharge: 2023-01-21 | Disposition: A | Discharge status: Home / Self Care | Payer: Medicaid Other | Source: Ambulatory Visit | Attending: Internal Medicine | Admitting: Internal Medicine

## 2023-01-21 ENCOUNTER — Telehealth (HOSPITAL_COMMUNITY): Payer: Self-pay

## 2023-01-21 DIAGNOSIS — Z79899 Other long term (current) drug therapy: Secondary | ICD-10-CM | POA: Diagnosis not present

## 2023-01-21 DIAGNOSIS — F1410 Cocaine abuse, uncomplicated: Secondary | ICD-10-CM | POA: Diagnosis not present

## 2023-01-21 DIAGNOSIS — D5700 Hb-SS disease with crisis, unspecified: Secondary | ICD-10-CM | POA: Insufficient documentation

## 2023-01-21 DIAGNOSIS — F1120 Opioid dependence, uncomplicated: Secondary | ICD-10-CM | POA: Insufficient documentation

## 2023-01-21 DIAGNOSIS — Z9049 Acquired absence of other specified parts of digestive tract: Secondary | ICD-10-CM | POA: Insufficient documentation

## 2023-01-21 DIAGNOSIS — D638 Anemia in other chronic diseases classified elsewhere: Secondary | ICD-10-CM | POA: Diagnosis not present

## 2023-01-21 DIAGNOSIS — G894 Chronic pain syndrome: Secondary | ICD-10-CM | POA: Diagnosis not present

## 2023-01-21 LAB — COMPREHENSIVE METABOLIC PANEL
ALT: 27 U/L (ref 0–44)
AST: 37 U/L (ref 15–41)
Albumin: 4.1 g/dL (ref 3.5–5.0)
Alkaline Phosphatase: 56 U/L (ref 38–126)
Anion gap: 5 (ref 5–15)
BUN: 8 mg/dL (ref 6–20)
CO2: 24 mmol/L (ref 22–32)
Calcium: 8.5 mg/dL — ABNORMAL LOW (ref 8.9–10.3)
Chloride: 111 mmol/L (ref 98–111)
Creatinine, Ser: 0.71 mg/dL (ref 0.44–1.00)
GFR, Estimated: >60 mL/min (ref >60)
Glucose, Bld: 86 mg/dL (ref 70–99)
Potassium: 3.7 mmol/L (ref 3.5–5.1)
Sodium: 140 mmol/L (ref 135–145)
Total Bilirubin: 0.8 mg/dL (ref 0.3–1.2)
Total Protein: 7.3 g/dL (ref 6.5–8.1)

## 2023-01-21 LAB — CBC WITH DIFFERENTIAL/PLATELET
Abs Immature Granulocytes: 0.04 10*3/uL (ref 0.00–0.07)
Basophils Absolute: 0.1 10*3/uL (ref 0.0–0.1)
Basophils Relative: 1 %
Eosinophils Absolute: 0.6 10*3/uL — ABNORMAL HIGH (ref 0.0–0.5)
Eosinophils Relative: 6 %
HCT: 33.1 % — ABNORMAL LOW (ref 36.0–46.0)
Hemoglobin: 12.2 g/dL (ref 12.0–15.0)
Immature Granulocytes: 1 %
Lymphocytes Relative: 21 %
Lymphs Abs: 1.9 10*3/uL (ref 0.7–4.0)
MCH: 27.9 pg (ref 26.0–34.0)
MCHC: 36.9 g/dL — ABNORMAL HIGH (ref 30.0–36.0)
MCV: 75.7 fL — ABNORMAL LOW (ref 80.0–100.0)
Monocytes Absolute: 0.8 10*3/uL (ref 0.1–1.0)
Monocytes Relative: 9 %
Neutro Abs: 5.5 10*3/uL (ref 1.7–7.7)
Neutrophils Relative %: 62 %
Platelets: 374 10*3/uL (ref 150–400)
RBC: 4.37 MIL/uL (ref 3.87–5.11)
RDW: 14.8 % (ref 11.5–15.5)
WBC: 8.8 10*3/uL (ref 4.0–10.5)
nRBC: 3.8 % — ABNORMAL HIGH (ref 0.0–0.2)

## 2023-01-21 LAB — RETICULOCYTES
Immature Retic Fract: 31.4 % — ABNORMAL HIGH (ref 2.3–15.9)
RBC.: 4.34 MIL/uL (ref 3.87–5.11)
Retic Count, Absolute: 203.5 10*3/uL — ABNORMAL HIGH (ref 19.0–186.0)
Retic Ct Pct: 4.7 % — ABNORMAL HIGH (ref 0.4–3.1)

## 2023-01-21 MED ORDER — DIPHENHYDRAMINE HCL 25 MG PO CAPS: 25.0000 mg | ORAL_CAPSULE | Freq: Once | ORAL | Status: DC

## 2023-01-21 MED ORDER — HYDROMORPHONE HCL 2 MG/ML IJ SOLN
2.0000 mg | INTRAMUSCULAR | Status: AC
  Administered 2023-01-21 (×2): 2 mg via SUBCUTANEOUS
  Filled 2023-01-21 (×2): qty 1

## 2023-01-21 MED ORDER — HYDROMORPHONE HCL 2 MG/ML IJ SOLN
2.0000 mg | Freq: Once | INTRAMUSCULAR | Status: AC
  Administered 2023-01-21: 2 mg via SUBCUTANEOUS
  Filled 2023-01-21: qty 1

## 2023-01-21 MED ORDER — SODIUM CHLORIDE 0.45 % IV SOLN: INTRAVENOUS | Status: DC

## 2023-01-21 MED ORDER — ACETAMINOPHEN 500 MG PO TABS
1000.0000 mg | ORAL_TABLET | Freq: Once | ORAL | Status: AC
  Administered 2023-01-21: 1000 mg via ORAL
  Filled 2023-01-21: qty 2

## 2023-01-21 MED ORDER — OXYCODONE HCL 5 MG PO TABS
20.0000 mg | ORAL_TABLET | Freq: Once | ORAL | Status: AC
  Administered 2023-01-21: 20 mg via ORAL
  Filled 2023-01-21: qty 4

## 2023-01-21 MED ORDER — KETOROLAC TROMETHAMINE 30 MG/ML IJ SOLN
15.0000 mg | Freq: Once | INTRAMUSCULAR | Status: AC
  Administered 2023-01-21: 15 mg via INTRAVENOUS
  Filled 2023-01-21: qty 1

## 2023-01-21 MED ORDER — ONDANSETRON HCL 4 MG/2ML IJ SOLN: 4.0000 mg | Freq: Once | INTRAMUSCULAR | Status: DC

## 2023-01-21 NOTE — Progress Notes (Signed)
Patient admitted to the day infusion hospital for sickle cell pain. Patient initially reported right arm and side pain rated 10/10. For pain management, patient given 2 mg Dilaudid sub-q x 3 doses every 1 hour, 15 mg IV Toradol, 1000 mg Tylenol, 20 mg Oxycodone and patient hydrated with IV fluids. At discharge, patient rated pain at 7/10. Vital signs stable. AVS offered but patient refused. Patient alert, oriented and ambulatory at discharge.

## 2023-01-21 NOTE — Telephone Encounter (Signed)
Pt called wanting to come in today for sickle cell pain treatment. Pt reports 10/10 pain to right arm and side. Pt states that she has not taken any pain medication, and her doctor is not refilling her medication. Pt denies being to the ER. Pt denies fever, chest pain, N/V/D, and chest pain. Pt states having some abdomen pain, but says it is typical for crisis. [place], [place] notified and she said pt can come in for treatment today. Provider said that the plan of care for patient will be no IV pain medications today. Pt notified and verbalized understanding. Pt states that they will need to use taxi transportationservices today.

## 2023-01-21 NOTE — H&P (Signed)
[facility] History and Physical   Date: 01/21/2023  Patient name: Hannah Hawkins Medical record number: [id number] Date of birth: [birth year] Age: 37 y.o. Gender: female PCP: Fenton Foy, NP  Attending physician: Tresa Garter, MD  Chief Complaint: Sickle cell pain  History of Present Illness: Hannah Hawkins is a 37 year old female with a medical history significant for sickle cell disease, chronic pain syndrome, opiate dependence and tolerance, history of cocaine abuse, and history of anemia of chronic disease presents with complaints of right upper extremity pain.  Pain is consistent with previous sickle cell pain crisis.  Patient reports 1 day of intense pain to right upper extremity that she rates 10/10.  Patient states that pain has been constant and throbbing.  She states that she does not have any home medications and has not done any interventions to alleviate pain.  Patient denies any headache, chest pain, shortness of breath, urinary symptoms, nausea, vomiting, or diarrhea.  No sick contacts, recent travel, or known exposure to COVID-19.  Meds: Medications Prior to Admission  Medication Sig Dispense Refill Last Dose   azelastine (ASTELIN) 0.1 % nasal spray Place 2 sprays into both nostrils 2 (two) times daily. Use in each nostril as directed 30 mL 3    benzonatate (TESSALON) 100 MG capsule Take 1 capsule (100 mg total) by mouth 2 (two) times daily as needed for cough. 20 capsule 0    fluticasone (FLONASE) 50 MCG/ACT nasal spray Place 2 sprays into both nostrils daily. 16 g 2    ibuprofen (ADVIL) 600 MG tablet Take 1 tablet (600 mg total) by mouth every 8 (eight) hours as needed. 30 tablet 0    lidocaine (HM LIDOCAINE PATCH) 4 % Place 1 patch onto the skin daily. (Patient not taking: Reported on 01/12/2023) 15 patch 0    montelukast (SINGULAIR) 10 MG tablet Take 1 tablet (10 mg total) by mouth at bedtime. (Patient not taking: Reported on 01/12/2023) 30  tablet 3    NARCAN 4 MG/0.1ML LIQD nasal spray kit Place 4 mg into the nose once as needed (overdose).  0    norethindrone (MICRONOR) 0.35 MG tablet Take 1 tablet (0.35 mg total) by mouth daily. (Patient not taking: Reported on 01/12/2023) 28 tablet 11    ondansetron (ZOFRAN) 8 MG tablet Take 1 tablet (8 mg total) by mouth every 8 (eight) hours as needed for nausea or vomiting. 20 tablet 0    oxyCODONE (ROXICODONE) 15 MG immediate release tablet Take 1 tablet (15 mg total) by mouth every 6 (six) hours as needed for pain. 60 tablet 0    pantoprazole (PROTONIX) 40 MG tablet Take 1 tablet (40 mg total) by mouth daily. (Patient not taking: Reported on 03/17/2022) 30 tablet 11    risperiDONE (RISPERDAL) 2 MG tablet Take 1 tablet (2 mg total) by mouth at bedtime. (Patient not taking: Reported on 01/12/2023) 14 tablet 0    sodium chloride (OCEAN) 0.65 % SOLN nasal spray Place 1 spray into both nostrils as needed for congestion. 30 mL 0    Vitamin D, Ergocalciferol, (DRISDOL) 1.25 MG (50000 UNIT) CAPS capsule Take 1 capsule (50,000 Units total) by mouth every 7 (seven) days. 5 capsule 2     Allergies: Penicillins Past Medical History:  Diagnosis Date   Sickle cell anemia ([place])    Past Surgical History:  Procedure Laterality Date   CHOLECYSTECTOMY N/A 06/03/2021   Procedure: LAPAROSCOPIC CHOLECYSTECTOMY;  Surgeon: Coralie Keens, MD;  Location: [facility];  Service:  General;  Laterality: N/A;   EYE SURGERY  03/09/2017   left eye   EYE SURGERY Right 09/2019   right hemerage    Family History  Problem Relation Age of Onset   Diabetes Father    Social History   Socioeconomic History   Marital status: Single    Spouse name: Not on file   Number of children: Not on file   Years of education: Not on file   Highest education level: Not on file  Occupational History   Not on file  Tobacco Use   Smoking status: Former    Types: Cigarettes   Smokeless tobacco: Never  Vaping Use   Vaping Use:  Every day   Substances: Flavoring  Substance and Sexual Activity   Alcohol use: Not Currently   Drug use: Not Currently    Types: Marijuana   Sexual activity: Yes  Other Topics Concern   Not on file  Social History Narrative   Not on file   Social Determinants of Health   Financial Resource Strain: Not on file  Food Insecurity: No Food Insecurity (10/09/2022)   Hunger Vital Sign    Worried About Running Out of Food in the Last Year: Never true    Ran Out of Food in the Last Year: Never true  Transportation Needs: No Transportation Needs (10/09/2022)   PRAPARE - Hydrologist (Medical): No    Lack of Transportation (Non-Medical): No  Physical Activity: Not on file  Stress: Not on file  Social Connections: Not on file  Intimate Partner Violence: Not At Risk (10/09/2022)   Humiliation, Afraid, Rape, and Kick questionnaire    Fear of Current or Ex-Partner: No    Emotionally Abused: No    Physically Abused: No    Sexually Abused: No   Review of Systems  Constitutional: Negative.   HENT: Negative.    Eyes: Negative.   Respiratory: Negative.    Cardiovascular: Negative.   Gastrointestinal: Negative.   Genitourinary: Negative.   Musculoskeletal:  Positive for joint pain and myalgias.       Right upper extremity pain  Skin: Negative.   Neurological: Negative.   Psychiatric/Behavioral: Negative.       Physical Exam: Blood pressure (!) 141/87, pulse 69, temperature 98.1 F (36.7 C), temperature source Temporal, resp. rate 16, SpO2 100 %. Physical Exam Constitutional:      Appearance: Normal appearance.  Cardiovascular:     Rate and Rhythm: Normal rate and regular rhythm.     Pulses: Normal pulses.  Pulmonary:     Effort: Pulmonary effort is normal.  Abdominal:     General: Bowel sounds are normal.  Skin:    General: Skin is warm.  Neurological:     General: No focal deficit present.     Mental Status: She is alert. Mental status is at  baseline.  Psychiatric:        Mood and Affect: Mood normal.        Thought Content: Thought content normal.        Judgment: Judgment normal.      Lab results: No results found for this or any previous visit (from the past 24 hour(s)).  Imaging results:  No results found.   Assessment & Plan: Patient admitted to sickle cell day infusion center for management of pain crisis.  Patient is opiate tolerant Dilaudid 2 mg SQ x 3 IV fluids, 0.45% saline at 100 ml/hr Toradol 15 mg IV times one  dose Tylenol 1000 mg by mouth times one dose Review CBC with differential, complete metabolic panel, and reticulocytes as results become available.Pain intensity will be reevaluated in context of functioning and relationship to baseline as care progresses If pain intensity remains elevated and/or sudden change in hemodynamic stability transition to inpatient services for higher level of care.      Donia Pounds  APRN, MSN, FNP-C Patient [facility] [address] [place], [place] [address] [phone]  01/21/2023, 12:22 PM

## 2023-01-24 NOTE — Discharge Summary (Incomplete)
[facility] Discharge Summary   Patient ID: Hannah Hawkins MRN: [id number] DOB/AGE: [birth year] 37 y.o.  Admit date: 01/21/2023 Discharge date: 01/24/2023  Primary Care Physician:  Fenton Foy, NP  Admission Diagnoses:  Principal Problem:   Sickle cell pain crisis [facility])  Discharge Medications:  Allergies as of 01/21/2023       Reactions   Penicillins Swelling, Rash   Has patient had a PCN reaction causing immediate rash, facial/tongue/throat swelling, SOB or lightheadedness with hypotension: Yes Has patient had a PCN reaction causing severe rash involving mucus membranes or skin necrosis: Yes Has patient had a PCN reaction that required hospitalization No Has patient had a PCN reaction occurring within the last 10 years: no If all of the above answers are "NO", then may proceed with Cephalosporin use.        Medication List     TAKE these medications    azelastine 0.1 % nasal spray Commonly known as: ASTELIN Place 2 sprays into both nostrils 2 (two) times daily. Use in each nostril as directed   benzonatate 100 MG capsule Commonly known as: TESSALON Take 1 capsule (100 mg total) by mouth 2 (two) times daily as needed for cough.   fluticasone 50 MCG/ACT nasal spray Commonly known as: FLONASE Place 2 sprays into both nostrils daily.   ibuprofen 600 MG tablet Commonly known as: ADVIL Take 1 tablet (600 mg total) by mouth every 8 (eight) hours as needed.   lidocaine 4 % Commonly known as: HM Lidocaine Patch Place 1 patch onto the skin daily.   montelukast 10 MG tablet Commonly known as: SINGULAIR Take 1 tablet (10 mg total) by mouth at bedtime.   Narcan 4 MG/0.1ML Liqd nasal spray kit Generic drug: naloxone Place 4 mg into the nose once as needed (overdose).   norethindrone 0.35 MG tablet Commonly known as: MICRONOR Take 1 tablet (0.35 mg total) by mouth daily.   ondansetron 8 MG tablet Commonly known as: ZOFRAN Take 1 tablet (8 mg total)  by mouth every 8 (eight) hours as needed for nausea or vomiting.   oxyCODONE 15 MG immediate release tablet Commonly known as: ROXICODONE Take 1 tablet (15 mg total) by mouth every 6 (six) hours as needed for pain.   pantoprazole 40 MG tablet Commonly known as: PROTONIX Take 1 tablet (40 mg total) by mouth daily.   risperiDONE 2 MG tablet Commonly known as: RISPERDAL Take 1 tablet (2 mg total) by mouth at bedtime.   sodium chloride 0.65 % Soln nasal spray Commonly known as: OCEAN Place 1 spray into both nostrils as needed for congestion.   Vitamin D (Ergocalciferol) 1.25 MG (50000 UNIT) Caps capsule Commonly known as: DRISDOL Take 1 capsule (50,000 Units total) by mouth every 7 (seven) days.         Consults:  None  Significant Diagnostic Studies:  No results found. History of Present Illness: Hannah Hawkins is a 37 year old female with a medical history significant for sickle cell disease, chronic pain syndrome, opiate dependence and tolerance, history of cocaine abuse, and history of anemia of chronic disease presents with complaints of right upper extremity pain.  Pain is consistent with previous sickle cell pain crisis.  Patient reports 1 day of intense pain to right upper extremity that she rates 10/10.  Patient states that pain has been constant and throbbing.  She states that she does not have any home medications and has not done any interventions to alleviate pain.  Patient denies any  headache, chest pain, shortness of breath, urinary symptoms, nausea, vomiting, or diarrhea.  No sick contacts, recent travel, or known exposure to COVID-19.    [facility] Course:   Physical Exam at Discharge:  BP 119/88 (BP Location: Right Arm)   Pulse 61   Temp 98.8 F (37.1 C) (Temporal)   Resp 16   LMP 01/20/2023   SpO2 97%  Physical Exam Constitutional:      Appearance: Normal appearance.  Eyes:     Pupils: Pupils are equal, round, and reactive to light.   Cardiovascular:     Rate and Rhythm: Normal rate and regular rhythm.     Pulses: Normal pulses.  Pulmonary:     Effort: Pulmonary effort is normal.  Abdominal:     General: Bowel sounds are normal.  Skin:    General: Skin is warm.  Neurological:     General: No focal deficit present.     Mental Status: She is alert. Mental status is at baseline.  Psychiatric:        Mood and Affect: Mood normal.        Behavior: Behavior normal.        Thought Content: Thought content normal.        Judgment: Judgment normal.     Disposition at Discharge: Discharge disposition: 01-Home or Self Care       Discharge Orders: Discharge Instructions     Discharge patient   Complete by: As directed    Discharge disposition: 01-Home or Self Care   Discharge patient date: 01/21/2023       Condition at Discharge:   Stable  Time spent on Discharge:  Greater than 30 minutes.  Signed: Donia Pounds  APRN, MSN, FNP-C Patient [facility] [address] [place], [place] [address] [phone]  01/24/2023, 12:34 PM

## 2023-01-25 ENCOUNTER — Non-Acute Institutional Stay (HOSPITAL_COMMUNITY)
Admission: AD | Admit: 2023-01-25 | Discharge: 2023-01-25 | Disposition: A | Discharge status: Home / Self Care | Payer: Medicaid Other | Source: Ambulatory Visit | Attending: Internal Medicine | Admitting: Internal Medicine

## 2023-01-25 ENCOUNTER — Telehealth (HOSPITAL_COMMUNITY): Payer: Self-pay

## 2023-01-25 DIAGNOSIS — F1120 Opioid dependence, uncomplicated: Secondary | ICD-10-CM | POA: Diagnosis not present

## 2023-01-25 DIAGNOSIS — D638 Anemia in other chronic diseases classified elsewhere: Secondary | ICD-10-CM | POA: Diagnosis not present

## 2023-01-25 DIAGNOSIS — D5700 Hb-SS disease with crisis, unspecified: Secondary | ICD-10-CM | POA: Diagnosis present

## 2023-01-25 DIAGNOSIS — G894 Chronic pain syndrome: Secondary | ICD-10-CM | POA: Insufficient documentation

## 2023-01-25 LAB — COMPREHENSIVE METABOLIC PANEL
ALT: 16 U/L (ref 0–44)
AST: 20 U/L (ref 15–41)
Albumin: 3.8 g/dL (ref 3.5–5.0)
Alkaline Phosphatase: 46 U/L (ref 38–126)
Anion gap: 5 (ref 5–15)
BUN: 13 mg/dL (ref 6–20)
CO2: 27 mmol/L (ref 22–32)
Calcium: 8.6 mg/dL — ABNORMAL LOW (ref 8.9–10.3)
Chloride: 107 mmol/L (ref 98–111)
Creatinine, Ser: 0.71 mg/dL (ref 0.44–1.00)
GFR, Estimated: >60 mL/min (ref >60)
Glucose, Bld: 96 mg/dL (ref 70–99)
Potassium: 3.5 mmol/L (ref 3.5–5.1)
Sodium: 139 mmol/L (ref 135–145)
Total Bilirubin: 0.9 mg/dL (ref 0.3–1.2)
Total Protein: 7.1 g/dL (ref 6.5–8.1)

## 2023-01-25 LAB — CBC WITH DIFFERENTIAL/PLATELET
Abs Immature Granulocytes: 0.03 10*3/uL (ref 0.00–0.07)
Basophils Absolute: 0.1 10*3/uL (ref 0.0–0.1)
Basophils Relative: 1 %
Eosinophils Absolute: 1.2 10*3/uL — ABNORMAL HIGH (ref 0.0–0.5)
Eosinophils Relative: 14 %
HCT: 30.6 % — ABNORMAL LOW (ref 36.0–46.0)
Hemoglobin: 11.4 g/dL — ABNORMAL LOW (ref 12.0–15.0)
Immature Granulocytes: 0 %
Lymphocytes Relative: 25 %
Lymphs Abs: 2 10*3/uL (ref 0.7–4.0)
MCH: 27.9 pg (ref 26.0–34.0)
MCHC: 37.3 g/dL — ABNORMAL HIGH (ref 30.0–36.0)
MCV: 75 fL — ABNORMAL LOW (ref 80.0–100.0)
Monocytes Absolute: 0.6 10*3/uL (ref 0.1–1.0)
Monocytes Relative: 8 %
Neutro Abs: 4.3 10*3/uL (ref 1.7–7.7)
Neutrophils Relative %: 52 %
Platelets: 440 10*3/uL — ABNORMAL HIGH (ref 150–400)
RBC: 4.08 MIL/uL (ref 3.87–5.11)
RDW: 14.6 % (ref 11.5–15.5)
WBC: 8.2 10*3/uL (ref 4.0–10.5)
nRBC: 3.4 % — ABNORMAL HIGH (ref 0.0–0.2)

## 2023-01-25 LAB — RETICULOCYTES
Immature Retic Fract: 28.7 % — ABNORMAL HIGH (ref 2.3–15.9)
RBC.: 4.08 MIL/uL (ref 3.87–5.11)
Retic Count, Absolute: 189.7 10*3/uL — ABNORMAL HIGH (ref 19.0–186.0)
Retic Ct Pct: 4.7 % — ABNORMAL HIGH (ref 0.4–3.1)

## 2023-01-25 LAB — RAPID URINE DRUG SCREEN, HOSP PERFORMED
Amphetamines: NOT DETECTED
Barbiturates: NOT DETECTED
Benzodiazepines: NOT DETECTED
Cocaine: POSITIVE — AB
Opiates: NOT DETECTED
Tetrahydrocannabinol: NOT DETECTED

## 2023-01-25 MED ORDER — ACETAMINOPHEN 500 MG PO TABS
1000.0000 mg | ORAL_TABLET | Freq: Once | ORAL | Status: DC
  Filled 2023-01-25: qty 2

## 2023-01-25 MED ORDER — ONDANSETRON HCL 4 MG/2ML IJ SOLN
4.0000 mg | Freq: Once | INTRAMUSCULAR | Status: AC
  Administered 2023-01-25: 4 mg via INTRAVENOUS
  Filled 2023-01-25: qty 2

## 2023-01-25 MED ORDER — OXYCODONE HCL 5 MG PO TABS
20.0000 mg | ORAL_TABLET | Freq: Once | ORAL | Status: AC
  Administered 2023-01-25: 20 mg via ORAL
  Filled 2023-01-25: qty 4

## 2023-01-25 MED ORDER — KETOROLAC TROMETHAMINE 30 MG/ML IJ SOLN
15.0000 mg | Freq: Once | INTRAMUSCULAR | Status: AC
  Administered 2023-01-25: 15 mg via INTRAVENOUS
  Filled 2023-01-25: qty 1

## 2023-01-25 MED ORDER — HYDROMORPHONE HCL 2 MG/ML IJ SOLN
2.0000 mg | INTRAMUSCULAR | Status: AC
  Administered 2023-01-25 (×3): 2 mg via SUBCUTANEOUS
  Filled 2023-01-25 (×3): qty 1

## 2023-01-25 MED ORDER — SODIUM CHLORIDE 0.45 % IV SOLN: INTRAVENOUS | Status: DC

## 2023-01-25 NOTE — H&P (Incomplete)
[facility] History and Physical   Date: 01/25/2023  Patient name: Hannah Hawkins Medical record number: [id number] Date of birth: 1986-03-04 Age: 37 y.o. Gender: female PCP: Fenton Foy, NP  Attending physician: Tresa Garter, MD  Chief Complaint: Sickle cell pain   History of Present Illness: Viva Gallaher is a 37 year old female with a medical history significant for sickle cell disease, chronic pain syndrome, opiate dependence and tolerance, anemia of chronic disease and history of cocaine use presents for  Meds: Medications Prior to Admission  Medication Sig Dispense Refill Last Dose   azelastine (ASTELIN) 0.1 % nasal spray Place 2 sprays into both nostrils 2 (two) times daily. Use in each nostril as directed 30 mL 3    benzonatate (TESSALON) 100 MG capsule Take 1 capsule (100 mg total) by mouth 2 (two) times daily as needed for cough. 20 capsule 0    fluticasone (FLONASE) 50 MCG/ACT nasal spray Place 2 sprays into both nostrils daily. 16 g 2    ibuprofen (ADVIL) 600 MG tablet Take 1 tablet (600 mg total) by mouth every 8 (eight) hours as needed. 30 tablet 0    lidocaine (HM LIDOCAINE PATCH) 4 % Place 1 patch onto the skin daily. (Patient not taking: Reported on 01/12/2023) 15 patch 0    montelukast (SINGULAIR) 10 MG tablet Take 1 tablet (10 mg total) by mouth at bedtime. (Patient not taking: Reported on 01/12/2023) 30 tablet 3    NARCAN 4 MG/0.1ML LIQD nasal spray kit Place 4 mg into the nose once as needed (overdose).  0    norethindrone (MICRONOR) 0.35 MG tablet Take 1 tablet (0.35 mg total) by mouth daily. (Patient not taking: Reported on 01/12/2023) 28 tablet 11    ondansetron (ZOFRAN) 8 MG tablet Take 1 tablet (8 mg total) by mouth every 8 (eight) hours as needed for nausea or vomiting. 20 tablet 0    oxyCODONE (ROXICODONE) 15 MG immediate release tablet Take 1 tablet (15 mg total) by mouth every 6 (six) hours as needed for pain. 60 tablet 0     pantoprazole (PROTONIX) 40 MG tablet Take 1 tablet (40 mg total) by mouth daily. (Patient not taking: Reported on 03/17/2022) 30 tablet 11    risperiDONE (RISPERDAL) 2 MG tablet Take 1 tablet (2 mg total) by mouth at bedtime. (Patient not taking: Reported on 01/12/2023) 14 tablet 0    sodium chloride (OCEAN) 0.65 % SOLN nasal spray Place 1 spray into both nostrils as needed for congestion. 30 mL 0    Vitamin D, Ergocalciferol, (DRISDOL) 1.25 MG (50000 UNIT) CAPS capsule Take 1 capsule (50,000 Units total) by mouth every 7 (seven) days. 5 capsule 2     Allergies: Penicillins Past Medical History:  Diagnosis Date   Sickle cell anemia ([place])    Past Surgical History:  Procedure Laterality Date   CHOLECYSTECTOMY N/A 06/03/2021   Procedure: LAPAROSCOPIC CHOLECYSTECTOMY;  Surgeon: Coralie Keens, MD;  Location: [facility];  Service: General;  Laterality: N/A;   EYE SURGERY  03/09/2017   left eye   EYE SURGERY Right 09/2019   right hemerage    Family History  Problem Relation Age of Onset   Diabetes Father    Social History   Socioeconomic History   Marital status: Single    Spouse name: Not on file   Number of children: Not on file   Years of education: Not on file   Highest education level: Not on file  Occupational History  Not on file  Tobacco Use   Smoking status: Former    Types: Cigarettes   Smokeless tobacco: Never  Vaping Use   Vaping Use: Every day   Substances: Flavoring  Substance and Sexual Activity   Alcohol use: Not Currently   Drug use: Not Currently    Types: Marijuana   Sexual activity: Yes  Other Topics Concern   Not on file  Social History Narrative   Not on file   Social Determinants of Health   Financial Resource Strain: Not on file  Food Insecurity: No Food Insecurity (10/09/2022)   Hunger Vital Sign    Worried About Running Out of Food in the Last Year: Never true    Ran Out of Food in the Last Year: Never true  Transportation Needs: No  Transportation Needs (10/09/2022)   PRAPARE - Hydrologist (Medical): No    Lack of Transportation (Non-Medical): No  Physical Activity: Not on file  Stress: Not on file  Social Connections: Not on file  Intimate Partner Violence: Not At Risk (10/09/2022)   Humiliation, Afraid, Rape, and Kick questionnaire    Fear of Current or Ex-Partner: No    Emotionally Abused: No    Physically Abused: No    Sexually Abused: No   Review of Systems  Constitutional: Negative.   HENT: Negative.    Eyes: Negative.   Cardiovascular: Negative.   Gastrointestinal: Negative.   Genitourinary: Negative.   Musculoskeletal:  Positive for back pain and joint pain.  Skin: Negative.   Neurological: Negative.   Psychiatric/Behavioral: Negative.      Physical Exam: Blood pressure 128/73, pulse 80, temperature 98.1 F (36.7 C), temperature source Temporal, resp. rate 16, last menstrual period 01/20/2023, SpO2 100 %. Physical Exam Constitutional:      Appearance: Normal appearance.  Eyes:     Pupils: Pupils are equal, round, and reactive to light.  Cardiovascular:     Rate and Rhythm: Normal rate and regular rhythm.     Pulses: Normal pulses.  Pulmonary:     Effort: Pulmonary effort is normal.  Abdominal:     General: Bowel sounds are normal.  Musculoskeletal:        General: Normal range of motion.  Skin:    General: Skin is warm.  Neurological:     General: No focal deficit present.     Mental Status: She is alert. Mental status is at baseline.  Psychiatric:        Mood and Affect: Mood normal.        Behavior: Behavior normal.        Thought Content: Thought content normal.        Judgment: Judgment normal.      Lab results: No results found for this or any previous visit (from the past 24 hour(s)).  Imaging results:  No results found.   Assessment & Plan: Patient admitted to sickle cell day infusion center for management of pain crisis.  Patient is  opiate tolerant Initiate Dilaudid 2 mg x 3 IV fluids, 0.45% at 100 ml/hr Toradol 15 mg IV times one dose Tylenol 1000 mg by mouth times one dose Review CBC with differential, complete metabolic panel, and reticulocytes as results become available.Pain intensity will be reevaluated in context of functioning and relationship to baseline as care progresses If pain intensity remains elevated and/or sudden change in hemodynamic stability transition to inpatient services for higher level of care.    Donia Pounds  APRN, MSN, FNP-C Patient [facility] [address] [place], [place] [address] [phone]  01/25/2023, 11:56 AM

## 2023-01-25 NOTE — Discharge Summary (Signed)
[facility] Discharge Summary   Patient ID: Kielyn Kardell MRN: [id number] DOB/AGE: 1986-06-04 37 y.o.  Admit date: 01/21/2023 Discharge date: 01/21/2023  Primary Care Physician:  Fenton Foy, NP  Admission Diagnoses:  Principal Problem:   Sickle cell pain crisis [facility])   Discharge Medications:  Allergies as of 01/21/2023       Reactions   Penicillins Swelling, Rash   Has patient had a PCN reaction causing immediate rash, facial/tongue/throat swelling, SOB or lightheadedness with hypotension: Yes Has patient had a PCN reaction causing severe rash involving mucus membranes or skin necrosis: Yes Has patient had a PCN reaction that required hospitalization No Has patient had a PCN reaction occurring within the last 10 years: no If all of the above answers are "NO", then may proceed with Cephalosporin use.        Medication List     TAKE these medications    azelastine 0.1 % nasal spray Commonly known as: ASTELIN Place 2 sprays into both nostrils 2 (two) times daily. Use in each nostril as directed   benzonatate 100 MG capsule Commonly known as: TESSALON Take 1 capsule (100 mg total) by mouth 2 (two) times daily as needed for cough.   fluticasone 50 MCG/ACT nasal spray Commonly known as: FLONASE Place 2 sprays into both nostrils daily.   ibuprofen 600 MG tablet Commonly known as: ADVIL Take 1 tablet (600 mg total) by mouth every 8 (eight) hours as needed.   lidocaine 4 % Commonly known as: HM Lidocaine Patch Place 1 patch onto the skin daily.   montelukast 10 MG tablet Commonly known as: SINGULAIR Take 1 tablet (10 mg total) by mouth at bedtime.   Narcan 4 MG/0.1ML Liqd nasal spray kit Generic drug: naloxone Place 4 mg into the nose once as needed (overdose).   norethindrone 0.35 MG tablet Commonly known as: MICRONOR Take 1 tablet (0.35 mg total) by mouth daily.   ondansetron 8 MG tablet Commonly known as: ZOFRAN Take 1 tablet (8 mg  total) by mouth every 8 (eight) hours as needed for nausea or vomiting.   oxyCODONE 15 MG immediate release tablet Commonly known as: ROXICODONE Take 1 tablet (15 mg total) by mouth every 6 (six) hours as needed for pain.   pantoprazole 40 MG tablet Commonly known as: PROTONIX Take 1 tablet (40 mg total) by mouth daily.   risperiDONE 2 MG tablet Commonly known as: RISPERDAL Take 1 tablet (2 mg total) by mouth at bedtime.   sodium chloride 0.65 % Soln nasal spray Commonly known as: OCEAN Place 1 spray into both nostrils as needed for congestion.   Vitamin D (Ergocalciferol) 1.25 MG (50000 UNIT) Caps capsule Commonly known as: DRISDOL Take 1 capsule (50,000 Units total) by mouth every 7 (seven) days.         Consults:  None  Significant Diagnostic Studies:  No results found.  History of Present Illness: Hannah Hawkins is a 37 year old female with a medical history significant for sickle cell disease, chronic pain syndrome, opiate dependence and tolerance, history of cocaine abuse, and history of anemia of chronic disease presents with complaints of right upper extremity pain.  Pain is consistent with previous sickle cell pain crisis.  Patient reports 1 day of intense pain to right upper extremity that she rates 10/10.  Patient states that pain has been constant and throbbing.  She states that she does not have any home medications and has not done any interventions to alleviate pain.  Patient  denies any headache, chest pain, shortness of breath, urinary symptoms, nausea, vomiting, or diarrhea.  No sick contacts, recent travel, or known exposure to COVID-19.  [facility] Course: Admitted to sickle cell day infusion clinic for management of pain crisis. Reviewed all laboratory values, largely consistent with patient's baseline. Pain managed with Dilaudid 2 mg SQ x 3, oxycodone 10 mg x 1, IV fluids, IV Toradol, and Tylenol. Pain intensity decreased throughout  admission and patient will be discharged home. Patient is alert and ambulating without assistance.  She will discharge home in a hemodynamically stable condition.  Discharge instructions: Resume all home medications.   Follow up with PCP as previously  scheduled.   Discussed the importance of drinking 64 ounces of water daily, dehydration of red blood cells may lead further sickling.   Avoid all stressors that precipitate sickle cell pain crisis.     The patient was given clear instructions to go to ER or return to medical center if symptoms do not improve, worsen or new problems develop.   Physical Exam at Discharge:  BP 119/88 (BP Location: Right Arm)   Pulse 61   Temp 98.8 F (37.1 C) (Temporal)   Resp 16   LMP 01/20/2023   SpO2 97%   Physical Exam Constitutional:      Appearance: Normal appearance.  Eyes:     Pupils: Pupils are equal, round, and reactive to light.  Cardiovascular:     Rate and Rhythm: Normal rate and regular rhythm.     Pulses: Normal pulses.  Pulmonary:     Effort: Pulmonary effort is normal.  Abdominal:     General: Bowel sounds are normal.  Musculoskeletal:        General: Normal range of motion.  Skin:    General: Skin is warm.  Neurological:     General: No focal deficit present.     Mental Status: She is alert and oriented to person, place, and time. Mental status is at baseline.  Psychiatric:        Mood and Affect: Mood normal.        Behavior: Behavior normal.        Thought Content: Thought content normal.        Judgment: Judgment normal.     Disposition at Discharge: Discharge disposition: 01-Home or Self Care       Discharge Orders: Discharge Instructions     Discharge patient   Complete by: As directed    Discharge disposition: 01-Home or Self Care   Discharge patient date: 01/21/2023       Condition at Discharge:   Stable  Time spent on Discharge:  Greater than 30 minutes.  Signed: Donia Pounds   APRN, MSN, FNP-C Patient [facility] [address] [place], [place] [address] [phone]  01/25/2023, 7:32 PM

## 2023-01-25 NOTE — Progress Notes (Signed)
Pt admitted to day hospital today for sickle cell pain treatment. On arrival, pt rated 10/10 generalized pain. For pain management, pt received 2 mg Dilaudid subcutaneous injection every 1 hour x 3 doses, 15 mg IV Torodal, and 20 mg PO Oxycodone. Pt also hydrated with IV fluids via PIV. At discharge, pt rated pain at 5/10. Pt is alert, oriented and ambulatory at discharge.

## 2023-01-25 NOTE — Telephone Encounter (Signed)
Pt called day hospital wanting to come in today for sickle cell pain treatment. Pt reports 10/10 generalized pain. Pt denies taking anything for pain. Pt said she took Ibuprofen yesterday. Pt screened for COVID and denies symptoms and exposure. Pt denies fever, chest pain, N/V. Pt states that she had some diarrhea yesterday, but it has resolved. Pt states having some abdominal pain, but states that this is typical for crisis. [place], [place] notified. Per provider, pt can come to day hospital today but the plan of care will be no IV pain medication. Pt notified and verbalized understanding. Pt states that their friend is their transportation today.

## 2023-01-27 ENCOUNTER — Telehealth (HOSPITAL_COMMUNITY): Payer: Self-pay | Admitting: *Deleted

## 2023-01-27 NOTE — Discharge Summary (Addendum)
[facility] Discharge Summary   Patient ID: Hannah Hawkins MRN: [id number] DOB/AGE: 04/06/1986 37 y.o.  Admit date: 01/25/2023 Discharge date: 01/25/2023  Primary Care Physician:  Fenton Foy, NP  Admission Diagnoses:  Principal Problem:   Sickle cell pain crisis [facility])  Discharge Medications:  Allergies as of 01/25/2023       Reactions   Penicillins Swelling, Rash   Has patient had a PCN reaction causing immediate rash, facial/tongue/throat swelling, SOB or lightheadedness with hypotension: Yes Has patient had a PCN reaction causing severe rash involving mucus membranes or skin necrosis: Yes Has patient had a PCN reaction that required hospitalization No Has patient had a PCN reaction occurring within the last 10 years: no If all of the above answers are "NO", then may proceed with Cephalosporin use.        Medication List     TAKE these medications    azelastine 0.1 % nasal spray Commonly known as: ASTELIN Place 2 sprays into both nostrils 2 (two) times daily. Use in each nostril as directed   benzonatate 100 MG capsule Commonly known as: TESSALON Take 1 capsule (100 mg total) by mouth 2 (two) times daily as needed for cough.   fluticasone 50 MCG/ACT nasal spray Commonly known as: FLONASE Place 2 sprays into both nostrils daily.   ibuprofen 600 MG tablet Commonly known as: ADVIL Take 1 tablet (600 mg total) by mouth every 8 (eight) hours as needed.   lidocaine 4 % Commonly known as: HM Lidocaine Patch Place 1 patch onto the skin daily.   montelukast 10 MG tablet Commonly known as: SINGULAIR Take 1 tablet (10 mg total) by mouth at bedtime.   Narcan 4 MG/0.1ML Liqd nasal spray kit Generic drug: naloxone Place 4 mg into the nose once as needed (overdose).   norethindrone 0.35 MG tablet Commonly known as: MICRONOR Take 1 tablet (0.35 mg total) by mouth daily.   ondansetron 8 MG tablet Commonly known as: ZOFRAN Take 1 tablet (8 mg  total) by mouth every 8 (eight) hours as needed for nausea or vomiting.   oxyCODONE 15 MG immediate release tablet Commonly known as: ROXICODONE Take 1 tablet (15 mg total) by mouth every 6 (six) hours as needed for pain.   pantoprazole 40 MG tablet Commonly known as: PROTONIX Take 1 tablet (40 mg total) by mouth daily.   risperiDONE 2 MG tablet Commonly known as: RISPERDAL Take 1 tablet (2 mg total) by mouth at bedtime.   sodium chloride 0.65 % Soln nasal spray Commonly known as: OCEAN Place 1 spray into both nostrils as needed for congestion.   Vitamin D (Ergocalciferol) 1.25 MG (50000 UNIT) Caps capsule Commonly known as: DRISDOL Take 1 capsule (50,000 Units total) by mouth every 7 (seven) days.         Consults:  None  Significant Diagnostic Studies:  No results found.  History of Present Illness: Hannah Hawkins is a 37 year old female with a medical history significant for sickle cell disease, chronic pain syndrome, opiate dependence and tolerance, anemia of chronic disease and history of cocaine use presents for pain primarily to upper and lower extremities over the past week. Patient says that pain is consistent with previous sickle cell pain crisis. Patient attributes increased pain to not receiving her home pain medication. Patient's UDS has been consistently positive for cocaine and she will not be prescribed home opiates. Patient last had Ibuprofen on last night without any relief. She rates her pain at  10/10, constant, and throbbing. She denies fever, chills, chest pain, or shortness of breath. No urinary symptoms, nausea, vomiting, or diarrhea. No sick contacts, recent travel, or known exposure to COVID-19.  [facility] Course: Patient admitted for management of sickle cell pain crisis. Reviewed all laboratory values: Urine drug screen positive for cocaine.  Patient counseled at length.  Declines information pertaining to drug rehabilitation.  Patient  will be referred to Estanislado Emms, LCSW. All of the laboratory values are consistent with this patient's baseline. Pain managed with Dilaudid 2 mg SQ x 3, oxycodone, IV Toradol, Tylenol, and IV fluids. Pain intensity decreased throughout admission and patient will be discharged home. Patient is hemodynamically stable. Patient is requesting a prescription for pain medications.  Discussed with patient at length that this clinic will not be able to prescribe any pain medications at this time.  Patient will need to follow-up with her PCP for possible referral to pain management.  Discharge summary:  Resume all home medications.   Follow up with PCP as previously  scheduled.   Discussed the importance of drinking 64 ounces of water daily, dehydration of red blood cells may lead further sickling.   Avoid all stressors that precipitate sickle cell pain crisis.     The patient was given clear instructions to go to ER or return to medical center if symptoms do not improve, worsen or new problems develop.    Physical Exam at Discharge:  BP 121/81 (BP Location: Left Arm)   Pulse 62   Temp 98 F (36.7 C) (Temporal)   Resp 16   LMP 01/20/2023   SpO2 94%   Physical Exam Constitutional:      Appearance: Normal appearance.  Eyes:     Pupils: Pupils are equal, round, and reactive to light.  Cardiovascular:     Rate and Rhythm: Normal rate and regular rhythm.  Pulmonary:     Effort: Pulmonary effort is normal.  Abdominal:     General: Bowel sounds are normal.  Musculoskeletal:        General: Normal range of motion.  Skin:    General: Skin is warm.  Neurological:     General: No focal deficit present.     Mental Status: She is alert. Mental status is at baseline.  Psychiatric:        Mood and Affect: Mood normal.        Behavior: Behavior normal.        Thought Content: Thought content normal.        Judgment: Judgment normal.     Disposition at Discharge: Discharge disposition:  01-Home or Self Care       Discharge Orders: Discharge Instructions     Discharge patient   Complete by: As directed    Discharge disposition: 01-Home or Self Care   Discharge patient date: 01/25/2023       Condition at Discharge:   Stable  Time spent on Discharge:  Greater than 30 minutes.  Signed: Donia Pounds  APRN, MSN, FNP-C [facility] [address] [place], [place] [address] [phone]  01/27/2023, 12:23 PM  Evaluation and management procedures were performed by the Advanced Practitioner under my supervision and collaboration. I have reviewed the Advanced Practitioner's note and chart, and I agree with the management and plan.   Angelica Chessman, MD, MHA, CPE, FACP, [place], [place] 01/27/2023, 2:31 PM

## 2023-01-27 NOTE — Telephone Encounter (Signed)
Patient called requesting to come to the day hospital for sickle cell pain. Patient reports generalized pain rated 10/10. Reports taking Ibuprofen for pain last night. Patient is currently out of pain medications and can not get them prescribed right now due to positive UDS. COVID-19 screening done and patient denies all symptoms and exposures. Admits to some abdominal pain and diarrhea but denies fever, chest pain, nausea and vomiting. [place], [place] notified. Patient can not come to the day hospital. Provider advised that patient make an appointment with her PCP for medication management and possible adjunct medications.  Patient advised and expresses an understanding.

## 2023-02-08 ENCOUNTER — Other Ambulatory Visit: Payer: Self-pay

## 2023-02-08 ENCOUNTER — Emergency Department (HOSPITAL_COMMUNITY)
Admission: EM | Admit: 2023-02-08 | Discharge: 2023-02-08 | Disposition: A | Discharge status: Home / Self Care | Payer: Medicaid Other | Attending: Emergency Medicine | Admitting: Emergency Medicine

## 2023-02-08 ENCOUNTER — Emergency Department (HOSPITAL_COMMUNITY): Payer: Medicaid Other

## 2023-02-08 DIAGNOSIS — D5700 Hb-SS disease with crisis, unspecified: Secondary | ICD-10-CM | POA: Diagnosis not present

## 2023-02-08 DIAGNOSIS — Z1152 Encounter for screening for COVID-19: Secondary | ICD-10-CM | POA: Diagnosis not present

## 2023-02-08 DIAGNOSIS — M79604 Pain in right leg: Secondary | ICD-10-CM | POA: Diagnosis present

## 2023-02-08 LAB — CBC WITH DIFFERENTIAL/PLATELET
Abs Immature Granulocytes: 0.04 10*3/uL (ref 0.00–0.07)
Basophils Absolute: 0.1 10*3/uL (ref 0.0–0.1)
Basophils Relative: 1 %
Eosinophils Absolute: 1.6 10*3/uL — ABNORMAL HIGH (ref 0.0–0.5)
Eosinophils Relative: 16 %
HCT: 30.2 % — ABNORMAL LOW (ref 36.0–46.0)
Hemoglobin: 11.3 g/dL — ABNORMAL LOW (ref 12.0–15.0)
Immature Granulocytes: 0 %
Lymphocytes Relative: 25 %
Lymphs Abs: 2.5 10*3/uL (ref 0.7–4.0)
MCH: 28.5 pg (ref 26.0–34.0)
MCHC: 37.4 g/dL — ABNORMAL HIGH (ref 30.0–36.0)
MCV: 76.3 fL — ABNORMAL LOW (ref 80.0–100.0)
Monocytes Absolute: 0.9 10*3/uL (ref 0.1–1.0)
Monocytes Relative: 9 %
Neutro Abs: 5.1 10*3/uL (ref 1.7–7.7)
Neutrophils Relative %: 49 %
Platelets: 344 10*3/uL (ref 150–400)
RBC: 3.96 MIL/uL (ref 3.87–5.11)
RDW: 14.4 % (ref 11.5–15.5)
WBC: 10.2 10*3/uL (ref 4.0–10.5)
nRBC: 2.1 % — ABNORMAL HIGH (ref 0.0–0.2)

## 2023-02-08 LAB — RETICULOCYTES
Immature Retic Fract: 31.4 % — ABNORMAL HIGH (ref 2.3–15.9)
RBC.: 3.96 MIL/uL (ref 3.87–5.11)
Retic Count, Absolute: 150.1 10*3/uL (ref 19.0–186.0)
Retic Ct Pct: 3.8 % — ABNORMAL HIGH (ref 0.4–3.1)

## 2023-02-08 LAB — COMPREHENSIVE METABOLIC PANEL
ALT: 12 U/L (ref 0–44)
AST: 22 U/L (ref 15–41)
Albumin: 3.6 g/dL (ref 3.5–5.0)
Alkaline Phosphatase: 57 U/L (ref 38–126)
Anion gap: 6 (ref 5–15)
BUN: 12 mg/dL (ref 6–20)
CO2: 23 mmol/L (ref 22–32)
Calcium: 8.2 mg/dL — ABNORMAL LOW (ref 8.9–10.3)
Chloride: 109 mmol/L (ref 98–111)
Creatinine, Ser: 0.77 mg/dL (ref 0.44–1.00)
GFR, Estimated: >60 mL/min (ref >60)
Glucose, Bld: 94 mg/dL (ref 70–99)
Potassium: 3.2 mmol/L — ABNORMAL LOW (ref 3.5–5.1)
Sodium: 138 mmol/L (ref 135–145)
Total Bilirubin: 0.9 mg/dL (ref 0.3–1.2)
Total Protein: 6.6 g/dL (ref 6.5–8.1)

## 2023-02-08 LAB — RESP PANEL BY RT-PCR (RSV, FLU A&B, COVID)  RVPGX2
Influenza A by PCR: NEGATIVE
Influenza B by PCR: NEGATIVE
Resp Syncytial Virus by PCR: NEGATIVE
SARS Coronavirus 2 by RT PCR: NEGATIVE

## 2023-02-08 MED ORDER — HYDROMORPHONE HCL 1 MG/ML IJ SOLN
1.0000 mg | INTRAMUSCULAR | Status: AC
  Administered 2023-02-08: 1 mg via INTRAVENOUS
  Filled 2023-02-08: qty 1

## 2023-02-08 MED ORDER — POTASSIUM CHLORIDE CRYS ER 20 MEQ PO TBCR
20.0000 meq | EXTENDED_RELEASE_TABLET | Freq: Once | ORAL | Status: AC
  Administered 2023-02-08: 20 meq via ORAL
  Filled 2023-02-08: qty 1

## 2023-02-08 MED ORDER — KETOROLAC TROMETHAMINE 15 MG/ML IJ SOLN
15.0000 mg | Freq: Once | INTRAMUSCULAR | Status: AC
  Administered 2023-02-08: 15 mg via INTRAVENOUS
  Filled 2023-02-08: qty 1

## 2023-02-08 MED ORDER — HYDROMORPHONE HCL 1 MG/ML IJ SOLN
0.5000 mg | INTRAMUSCULAR | Status: AC
  Administered 2023-02-08: 0.5 mg via INTRAVENOUS
  Filled 2023-02-08: qty 1

## 2023-02-08 NOTE — ED Provider Notes (Signed)
[facility] Provider Note   CSN: [id number] Arrival date & time: 02/08/23  1604     History  Chief Complaint  Patient presents with   Sickle Cell Pain Crisis    Hannah Hawkins is a 37 y.o. female with a past medical history significant for sickle cell anemia, chronic pain syndrome, history of cocaine use, GERD who presents to the ED due to sickle cell pain crisis.  Patient admits to right leg pain and pain throughout her low back and bilateral hands that started earlier today.  Patient is not currently on chronic opioids.  Notes she stopped taking chronic opioids in December.  Took ibuprofen with no relief in symptoms.  Denies chest pain and shortness of breath.  Admits to a mild cough.  No fever or chills.  Patient states her symptoms are typical for her pain crises. No history of DVT. Denies lower extremity edema.   History obtained from patient and past medical records. No interpreter used during encounter.       Home Medications Prior to Admission medications   Medication Sig Start Date End Date Taking? Authorizing Provider  azelastine (ASTELIN) 0.1 % nasal spray Place 2 sprays into both nostrils 2 (two) times daily. Use in each nostril as directed 11/08/22   Paseda, Dewaine Conger, FNP  benzonatate (TESSALON) 100 MG capsule Take 1 capsule (100 mg total) by mouth 2 (two) times daily as needed for cough. 11/19/22   Paseda, Dewaine Conger, FNP  fluticasone (FLONASE) 50 MCG/ACT nasal spray Place 2 sprays into both nostrils daily. 08/14/21   Vevelyn Francois, NP  ibuprofen (ADVIL) 600 MG tablet Take 1 tablet (600 mg total) by mouth every 8 (eight) hours as needed. 01/12/23   Fenton Foy, NP  lidocaine (HM LIDOCAINE PATCH) 4 % Place 1 patch onto the skin daily. Patient not taking: Reported on 01/12/2023 12/08/22   Fenton Foy, NP  montelukast (SINGULAIR) 10 MG tablet Take 1 tablet (10 mg total) by mouth at bedtime. Patient not taking:  Reported on 01/12/2023 08/14/21   Vevelyn Francois, NP  [facility] 4 MG/0.1ML LIQD nasal spray kit Place 4 mg into the nose once as needed (overdose). 12/22/17   [provider]  norethindrone (MICRONOR) 0.35 MG tablet Take 1 tablet (0.35 mg total) by mouth daily. Patient not taking: Reported on 01/12/2023 09/08/21   Vevelyn Francois, NP  ondansetron (ZOFRAN) 8 MG tablet Take 1 tablet (8 mg total) by mouth every 8 (eight) hours as needed for nausea or vomiting. 01/12/23   Fenton Foy, NP  oxyCODONE (ROXICODONE) 15 MG immediate release tablet Take 1 tablet (15 mg total) by mouth every 6 (six) hours as needed for pain. 12/23/22   Fenton Foy, NP  pantoprazole (PROTONIX) 40 MG tablet Take 1 tablet (40 mg total) by mouth daily. Patient not taking: Reported on 03/17/2022 09/07/21 09/07/22  Vevelyn Francois, NP  risperiDONE (RISPERDAL) 2 MG tablet Take 1 tablet (2 mg total) by mouth at bedtime. Patient not taking: Reported on 01/12/2023 05/10/22   Revonda Humphrey, NP  sodium chloride (OCEAN) 0.65 % SOLN nasal spray Place 1 spray into both nostrils as needed for congestion. 11/08/22   Paseda, Dewaine Conger, FNP  Vitamin D, Ergocalciferol, (DRISDOL) 1.25 MG (50000 UNIT) CAPS capsule Take 1 capsule (50,000 Units total) by mouth every 7 (seven) days. 12/06/22   Fenton Foy, NP      Allergies    Penicillins  Review of Systems   Review of Systems  Constitutional:  Negative for chills and fever.  Respiratory:  Positive for cough.   Cardiovascular:  Negative for chest pain.  Musculoskeletal:  Positive for arthralgias.  All other systems reviewed and are negative.   Physical Exam Updated Vital Signs BP 119/77 (BP Location: Right Arm)   Pulse 79   Temp 98 F (36.7 C)   Resp 17   Ht 5' 3"$  (1.6 m)   Wt 60 kg   LMP 01/20/2023   SpO2 100%   BMI 23.43 kg/m  Physical Exam Vitals and nursing note reviewed.  Constitutional:      General: She is not in acute distress.    Appearance: She is  not ill-appearing.  HENT:     Head: Normocephalic.  Eyes:     Pupils: Pupils are equal, round, and reactive to light.  Cardiovascular:     Rate and Rhythm: Normal rate and regular rhythm.     Pulses: Normal pulses.     Heart sounds: Normal heart sounds. No murmur heard.    No friction rub. No gallop.  Pulmonary:     Effort: Pulmonary effort is normal.     Breath sounds: Normal breath sounds.  Abdominal:     General: Abdomen is flat. There is no distension.     Palpations: Abdomen is soft.     Tenderness: There is no abdominal tenderness. There is no guarding or rebound.  Musculoskeletal:        General: Normal range of motion.     Cervical back: Neck supple.     Comments: diffuse tenderness throughout bilateral lower extremities.  No edema.  Skin:    General: Skin is warm and dry.  Neurological:     General: No focal deficit present.     Mental Status: She is alert.  Psychiatric:        Mood and Affect: Mood normal.        Behavior: Behavior normal.     ED Results / Procedures / Treatments   Labs (all labs ordered are listed, but only abnormal results are displayed) Labs Reviewed  CBC WITH DIFFERENTIAL/PLATELET - Abnormal; Notable for the following components:      Result Value   Hemoglobin 11.3 (*)    HCT 30.2 (*)    MCV 76.3 (*)    MCHC 37.4 (*)    nRBC 2.1 (*)    Eosinophils Absolute 1.6 (*)    All other components within normal limits  RETICULOCYTES - Abnormal; Notable for the following components:   Retic Ct Pct 3.8 (*)    Immature Retic Fract 31.4 (*)    All other components within normal limits  COMPREHENSIVE METABOLIC PANEL - Abnormal; Notable for the following components:   Potassium 3.2 (*)    Calcium 8.2 (*)    All other components within normal limits  RESP PANEL BY RT-PCR (RSV, FLU A&B, COVID)  [id number]    EKG None  Radiology DG Chest Port 1 View  Result Date: 02/08/2023 CLINICAL DATA:  Cough EXAM: PORTABLE CHEST 1 VIEW COMPARISON:  11/19/2022  FINDINGS: The heart size and mediastinal contours are within normal limits. Both lungs are clear. The visualized skeletal structures are unremarkable. IMPRESSION: No active disease. Electronically Signed   By: Donavan Foil M.D.   On: 02/08/2023 17:04    Procedures Procedures    Medications Ordered in ED Medications  ketorolac (TORADOL) 15 MG/ML injection 15 mg (has no administration in time range)  HYDROmorphone (DILAUDID) injection 0.5 mg (0.5 mg Intravenous Given 02/08/23 1707)  HYDROmorphone (DILAUDID) injection 1 mg (1 mg Intravenous Given 02/08/23 1755)  HYDROmorphone (DILAUDID) injection 1 mg (1 mg Intravenous Given 02/08/23 1855)  potassium chloride SA (KLOR-CON M) CR tablet 20 mEq (20 mEq Oral Given 02/08/23 1855)    ED Course/ Medical Decision Making/ A&P                             Medical Decision Making Amount and/or Complexity of Data Reviewed External Data Reviewed: notes. Labs: ordered. Decision-making details documented in ED Course. Radiology: ordered and independent interpretation performed. Decision-making details documented in ED Course.  Risk Prescription drug management.   This patient presents to the ED for concern of pain, this involves an extensive number of treatment options, and is a complaint that carries with it a high risk of complications and morbidity.  The differential diagnosis includes sickle cell pain crisis, DVT, viral process, etc  37 year old female presents the ED due to sickle cell pain crisis which started earlier today.  No chest pain or shortness of breath.  Stopped taking chronic opioids in December per patient.  Admits to a mild cough.  No fever or chills.  Upon arrival, patient is afebrile, not tachycardic or hypoxic.  Patient in no acute distress.  Diffuse tenderness throughout bilateral lower extremities.  No edema.  Low suspicion for DVT.  Bilateral lower extremities neurovascularly intact with soft compartments.  Sickle cell labs ordered.   IV Dilaudid given.  Chest x-ray and RVP ordered given cough to rule out evidence of infection.   CBC significant for anemia with hemoglobin 11.3.  No leukocytosis.  CMP significant for hypokalemia at 3.2.  Normal renal function.  Reticulocytes at baseline.  RVP negative.  Chest x-ray personally reviewed and interpreted which are negative for signs of pneumonia.  Possible viral etiology of cough.  7:41 PM reassessed patient after 3 doses of Dilaudid.  Patient admits to improvement in pain.  Patient states she is still having minimal amount of pain.  Offered admission for sickle cell pain crisis however, patient declined and notes she would prefer to go home.  Patient given dose of Toradol prior to discharge.  Patient stable for discharge. Strict ED precautions discussed with patient. Patient states understanding and agrees to plan. Patient discharged home in no acute distress and stable vitals  Has PCP Hx sickle cell anemia       Final Clinical Impression(s) / ED Diagnoses Final diagnoses:  Sickle cell pain crisis [facility])    Rx / DC Orders ED Discharge Orders     None         Karie Kirks 02/08/23 1946    Tretha Sciara, MD 02/08/23 2213

## 2023-02-08 NOTE — ED Triage Notes (Signed)
Patient BIB EMS from home c/o Sickle cell pain. Pt report worsening right leg pain. Pt denies N/V  BP 130/74 HR 80 RR 20 O2sat 99% on RA

## 2023-02-08 NOTE — Discharge Instructions (Signed)
It was a pleasure taking care of you today.  As discussed, your labs were reassuring.  You were treated for a sickle cell pain crisis.  Please follow-up with PCP within 1 week.  Return to the ER for new or worsening symptoms.

## 2023-02-09 ENCOUNTER — Ambulatory Visit: Payer: Self-pay | Admitting: Nurse Practitioner

## 2023-02-11 ENCOUNTER — Encounter: Payer: Self-pay | Admitting: Nurse Practitioner

## 2023-02-11 ENCOUNTER — Non-Acute Institutional Stay (HOSPITAL_COMMUNITY)
Admission: AD | Admit: 2023-02-11 | Discharge: 2023-02-11 | Disposition: A | Discharge status: Home / Self Care | Payer: Medicaid Other | Source: Ambulatory Visit | Attending: Internal Medicine | Admitting: Internal Medicine

## 2023-02-11 ENCOUNTER — Ambulatory Visit (INDEPENDENT_AMBULATORY_CARE_PROVIDER_SITE_OTHER): Payer: Medicaid Other | Admitting: Nurse Practitioner

## 2023-02-11 VITALS — BP 118/69 | HR 83 | Temp 97.0°F | Ht 62.0 in | Wt 144.8 lb

## 2023-02-11 DIAGNOSIS — G894 Chronic pain syndrome: Secondary | ICD-10-CM | POA: Diagnosis not present

## 2023-02-11 DIAGNOSIS — D5700 Hb-SS disease with crisis, unspecified: Secondary | ICD-10-CM | POA: Insufficient documentation

## 2023-02-11 DIAGNOSIS — F1120 Opioid dependence, uncomplicated: Secondary | ICD-10-CM | POA: Diagnosis not present

## 2023-02-11 LAB — COMPREHENSIVE METABOLIC PANEL
ALT: 15 U/L (ref 0–44)
AST: 24 U/L (ref 15–41)
Albumin: 3.7 g/dL (ref 3.5–5.0)
Alkaline Phosphatase: 51 U/L (ref 38–126)
Anion gap: 8 (ref 5–15)
BUN: 8 mg/dL (ref 6–20)
CO2: 24 mmol/L (ref 22–32)
Calcium: 8.3 mg/dL — ABNORMAL LOW (ref 8.9–10.3)
Chloride: 105 mmol/L (ref 98–111)
Creatinine, Ser: 0.74 mg/dL (ref 0.44–1.00)
GFR, Estimated: >60 mL/min (ref >60)
Glucose, Bld: 91 mg/dL (ref 70–99)
Potassium: 3.3 mmol/L — ABNORMAL LOW (ref 3.5–5.1)
Sodium: 137 mmol/L (ref 135–145)
Total Bilirubin: 0.8 mg/dL (ref 0.3–1.2)
Total Protein: 6.4 g/dL — ABNORMAL LOW (ref 6.5–8.1)

## 2023-02-11 LAB — CBC WITH DIFFERENTIAL/PLATELET
Abs Immature Granulocytes: 0.08 10*3/uL — ABNORMAL HIGH (ref 0.00–0.07)
Basophils Absolute: 0.2 10*3/uL — ABNORMAL HIGH (ref 0.0–0.1)
Basophils Relative: 1 %
Eosinophils Absolute: 0.9 10*3/uL — ABNORMAL HIGH (ref 0.0–0.5)
Eosinophils Relative: 9 %
HCT: 30 % — ABNORMAL LOW (ref 36.0–46.0)
Hemoglobin: 10.9 g/dL — ABNORMAL LOW (ref 12.0–15.0)
Immature Granulocytes: 1 %
Lymphocytes Relative: 34 %
Lymphs Abs: 3.5 10*3/uL (ref 0.7–4.0)
MCH: 27.7 pg (ref 26.0–34.0)
MCHC: 36.3 g/dL — ABNORMAL HIGH (ref 30.0–36.0)
MCV: 76.1 fL — ABNORMAL LOW (ref 80.0–100.0)
Monocytes Absolute: 1.2 10*3/uL — ABNORMAL HIGH (ref 0.1–1.0)
Monocytes Relative: 12 %
Neutro Abs: 4.5 10*3/uL (ref 1.7–7.7)
Neutrophils Relative %: 43 %
Platelets: 332 10*3/uL (ref 150–400)
RBC: 3.94 MIL/uL (ref 3.87–5.11)
RDW: 14.1 % (ref 11.5–15.5)
WBC: 10.4 10*3/uL (ref 4.0–10.5)
nRBC: 3.4 % — ABNORMAL HIGH (ref 0.0–0.2)

## 2023-02-11 LAB — RETICULOCYTES
Immature Retic Fract: 39 % — ABNORMAL HIGH (ref 2.3–15.9)
RBC.: 3.87 MIL/uL (ref 3.87–5.11)
Retic Count, Absolute: 169.5 10*3/uL (ref 19.0–186.0)
Retic Ct Pct: 4.4 % — ABNORMAL HIGH (ref 0.4–3.1)

## 2023-02-11 MED ORDER — DIPHENHYDRAMINE HCL 25 MG PO CAPS: 25.0000 mg | ORAL_CAPSULE | Freq: Once | ORAL | Status: DC

## 2023-02-11 MED ORDER — KETOROLAC TROMETHAMINE 30 MG/ML IJ SOLN
15.0000 mg | Freq: Once | INTRAMUSCULAR | Status: AC
  Administered 2023-02-11: 15 mg via INTRAVENOUS
  Filled 2023-02-11: qty 1

## 2023-02-11 MED ORDER — HYDROMORPHONE HCL 1 MG/ML IJ SOLN
1.0000 mg | INTRAMUSCULAR | Status: AC
  Administered 2023-02-11 (×3): 1 mg via SUBCUTANEOUS
  Filled 2023-02-11 (×3): qty 1

## 2023-02-11 MED ORDER — ACETAMINOPHEN 500 MG PO TABS
1000.0000 mg | ORAL_TABLET | Freq: Once | ORAL | Status: DC
  Filled 2023-02-11: qty 2

## 2023-02-11 MED ORDER — ONDANSETRON HCL 4 MG/2ML IJ SOLN
4.0000 mg | Freq: Once | INTRAMUSCULAR | Status: AC
  Administered 2023-02-11: 4 mg via INTRAVENOUS
  Filled 2023-02-11: qty 2

## 2023-02-11 MED ORDER — SODIUM CHLORIDE 0.45 % IV SOLN: INTRAVENOUS | Status: DC

## 2023-02-11 NOTE — Progress Notes (Signed)
Hannah triaged in the lobby of Hannah Hawkins. Hannah came from PCP appointment reporting sickle cell pain. Hannah reports pain in bilateral legs rated 10/10. Reports taking Ibuprofen for pain. Hannah is out of pain medications and can not get them prescribed. COVID-19 screening done and Hannah denies all symptoms and exposures. Denies fever, chest pain, nausea, vomiting, diarrhea and abdominal pain. Admits to having transportation without driving self. [place], [place] notified. Hannah can come to the day hospital for pain management. Hannah advised and expresses an understanding.

## 2023-02-11 NOTE — Patient Instructions (Signed)
1. Chronic pain syndrome  - Ambulatory referral to Physical Medicine Rehab   Follow up:  Follow up in 3 months

## 2023-02-11 NOTE — H&P (Signed)
[facility] History and Physical   Date: 02/11/2023  Patient name: Hannah Hawkins Medical record number: [id number] Date of birth: November 04, 1986 Age: 37 y.o. Gender: female PCP: Fenton Foy, NP  Attending physician: Tresa Garter, MD  Chief Complaint: Sickle cell pain  History of present illness:  Hannah Hawkins is a 37 year old female with a medical history significant for sickle cell disease, chronic pain syndrome, opiate dependence, and history of cocaine abuse presents with complaints of lower extremity pain that is consistent with her previous pain crisis.  Patient states that pain intensity increased this morning.  She does not have any home medications.  PCP has been unable to prescribe opiates consistently due to ongoing illicit drug use.  Patient's pain intensity is 8/10, constant, and throbbing.  Patient denies any headache, chest pain, urinary symptoms, nausea, vomiting, or diarrhea.  Meds: Medications Prior to Admission  Medication Sig Dispense Refill Last Dose   azelastine (ASTELIN) 0.1 % nasal spray Place 2 sprays into both nostrils 2 (two) times daily. Use in each nostril as directed 30 mL 3    benzonatate (TESSALON) 100 MG capsule Take 1 capsule (100 mg total) by mouth 2 (two) times daily as needed for cough. (Patient not taking: Reported on 02/11/2023) 20 capsule 0    fluticasone (FLONASE) 50 MCG/ACT nasal spray Place 2 sprays into both nostrils daily. 16 g 2    ibuprofen (ADVIL) 600 MG tablet Take 1 tablet (600 mg total) by mouth every 8 (eight) hours as needed. 30 tablet 0    lidocaine (HM LIDOCAINE PATCH) 4 % Place 1 patch onto the skin daily. (Patient not taking: Reported on 01/12/2023) 15 patch 0    montelukast (SINGULAIR) 10 MG tablet Take 1 tablet (10 mg total) by mouth at bedtime. (Patient not taking: Reported on 01/12/2023) 30 tablet 3    NARCAN 4 MG/0.1ML LIQD nasal spray kit Place 4 mg into the nose once as needed (overdose). (Patient not  taking: Reported on 02/11/2023)  0    norethindrone (MICRONOR) 0.35 MG tablet Take 1 tablet (0.35 mg total) by mouth daily. (Patient not taking: Reported on 01/12/2023) 28 tablet 11    ondansetron (ZOFRAN) 8 MG tablet Take 1 tablet (8 mg total) by mouth every 8 (eight) hours as needed for nausea or vomiting. 20 tablet 0    oxyCODONE (ROXICODONE) 15 MG immediate release tablet Take 1 tablet (15 mg total) by mouth every 6 (six) hours as needed for pain. (Patient not taking: Reported on 02/11/2023) 60 tablet 0    pantoprazole (PROTONIX) 40 MG tablet Take 1 tablet (40 mg total) by mouth daily. (Patient not taking: Reported on 03/17/2022) 30 tablet 11    risperiDONE (RISPERDAL) 2 MG tablet Take 1 tablet (2 mg total) by mouth at bedtime. (Patient not taking: Reported on 01/12/2023) 14 tablet 0    sodium chloride (OCEAN) 0.65 % SOLN nasal spray Place 1 spray into both nostrils as needed for congestion. (Patient not taking: Reported on 02/11/2023) 30 mL 0    Vitamin D, Ergocalciferol, (DRISDOL) 1.25 MG (50000 UNIT) CAPS capsule Take 1 capsule (50,000 Units total) by mouth every 7 (seven) days. 5 capsule 2     Allergies: Penicillins Past Medical History:  Diagnosis Date   Sickle cell anemia ([place])    Past Surgical History:  Procedure Laterality Date   CHOLECYSTECTOMY N/A 06/03/2021   Procedure: LAPAROSCOPIC CHOLECYSTECTOMY;  Surgeon: Coralie Keens, MD;  Location: [facility];  Service: General;  Laterality: N/A;  EYE SURGERY  03/09/2017   left eye   EYE SURGERY Right 09/2019   right [place]    Family History  Problem Relation Age of Onset   Diabetes Father    Social History   Socioeconomic History   Marital status: Single    Spouse name: Not on file   Number of children: Not on file   Years of education: Not on file   Highest education level: Not on file  Occupational History   Not on file  Tobacco Use   Smoking status: Former    Types: Cigarettes   Smokeless tobacco: Never  Vaping Use    Vaping Use: Every day   Substances: Flavoring  Substance and Sexual Activity   Alcohol use: Not Currently   Drug use: Not Currently    Types: Marijuana   Sexual activity: Yes  Other Topics Concern   Not on file  Social History Narrative   Not on file   Social Determinants of Health   Financial Resource Strain: Not on file  Food Insecurity: No Food Insecurity (10/09/2022)   Hunger Vital Sign    Worried About Running Out of Food in the Last Year: Never true    Ran Out of Food in the Last Year: Never true  Transportation Needs: No Transportation Needs (10/09/2022)   PRAPARE - Hydrologist (Medical): No    Lack of Transportation (Non-Medical): No  Physical Activity: Not on file  Stress: Not on file  Social Connections: Not on file  Intimate Partner Violence: Not At Risk (10/09/2022)   Humiliation, Afraid, Rape, and Kick questionnaire    Fear of Current or Ex-Partner: No    Emotionally Abused: No    Physically Abused: No    Sexually Abused: No   Review of Systems  Constitutional:  Negative for chills and fever.  HENT: Negative.    Eyes: Negative.   Respiratory: Negative.    Gastrointestinal: Negative.   Genitourinary: Negative.   Musculoskeletal:  Positive for back pain and joint pain.  Skin: Negative.   Neurological: Negative.   Psychiatric/Behavioral: Negative.      Physical Exam: Blood pressure 111/72, pulse 84, temperature 98.1 F (36.7 C), temperature source Temporal, resp. rate 16, last menstrual period 01/20/2023, SpO2 100 %. Physical Exam Constitutional:      Appearance: Normal appearance.  Eyes:     Pupils: Pupils are equal, round, and reactive to light.  Cardiovascular:     Rate and Rhythm: Normal rate and regular rhythm.  Pulmonary:     Effort: Pulmonary effort is normal.  Abdominal:     General: Bowel sounds are normal.  Musculoskeletal:        General: Normal range of motion.  Skin:    General: Skin is warm.   Neurological:     General: No focal deficit present.     Mental Status: She is alert.  Psychiatric:        Mood and Affect: Mood normal.        Behavior: Behavior normal.        Thought Content: Thought content normal.        Judgment: Judgment normal.      Lab results: No results found for this or any previous visit (from the past 24 hour(s)).  Imaging results:  No results found.   Assessment & Plan: Patient admitted to sickle cell day infusion center for management of pain crisis.  Patient is opiate tolerant Dilaudid 1 mg IV fluids,  0.45%saline at 150 ml/hr Toradol 15 mg IV times one dose Tylenol 1000 mg by mouth times one dose Review CBC with differential, complete metabolic panel, and reticulocytes as results become available. Pain intensity will be reevaluated in context of functioning and relationship to baseline as care progresses If pain intensity remains elevated and/or sudden change in hemodynamic stability transition to inpatient services for higher level of care.     Donia Pounds  APRN, MSN, FNP-C [facility] [address] [place], [place] [address] [phone]  02/11/2023, 10:40 AM

## 2023-02-11 NOTE — Discharge Summary (Signed)
[facility] Discharge Summary   Patient ID: Hannah Hawkins MRN: [id number] DOB/AGE: 39-13-1987 39 y.o.  Admit date: 02/11/2023 Discharge date: 02/11/2023  Primary Care Physician:  Fenton Foy, NP  Admission Diagnoses:  Principal Problem:   Sickle cell pain crisis [facility])    Discharge Medications:  Allergies as of 02/11/2023       Reactions   Penicillins Swelling, Rash   Has patient had a PCN reaction causing immediate rash, facial/tongue/throat swelling, SOB or lightheadedness with hypotension: Yes Has patient had a PCN reaction causing severe rash involving mucus membranes or skin necrosis: Yes Has patient had a PCN reaction that required hospitalization No Has patient had a PCN reaction occurring within the last 10 years: no If all of the above answers are "NO", then may proceed with Cephalosporin use.        Medication List     TAKE these medications    azelastine 0.1 % nasal spray Commonly known as: ASTELIN Place 2 sprays into both nostrils 2 (two) times daily. Use in each nostril as directed   benzonatate 100 MG capsule Commonly known as: TESSALON Take 1 capsule (100 mg total) by mouth 2 (two) times daily as needed for cough.   fluticasone 50 MCG/ACT nasal spray Commonly known as: FLONASE Place 2 sprays into both nostrils daily.   ibuprofen 600 MG tablet Commonly known as: ADVIL Take 1 tablet (600 mg total) by mouth every 8 (eight) hours as needed.   lidocaine 4 % Commonly known as: HM Lidocaine Patch Place 1 patch onto the skin daily.   montelukast 10 MG tablet Commonly known as: SINGULAIR Take 1 tablet (10 mg total) by mouth at bedtime.   Narcan 4 MG/0.1ML Liqd nasal spray kit Generic drug: naloxone Place 4 mg into the nose once as needed (overdose).   norethindrone 0.35 MG tablet Commonly known as: MICRONOR Take 1 tablet (0.35 mg total) by mouth daily.   ondansetron 8 MG tablet Commonly known as: ZOFRAN Take 1 tablet (8 mg  total) by mouth every 8 (eight) hours as needed for nausea or vomiting.   oxyCODONE 15 MG immediate release tablet Commonly known as: ROXICODONE Take 1 tablet (15 mg total) by mouth every 6 (six) hours as needed for pain.   pantoprazole 40 MG tablet Commonly known as: PROTONIX Take 1 tablet (40 mg total) by mouth daily.   risperiDONE 2 MG tablet Commonly known as: RISPERDAL Take 1 tablet (2 mg total) by mouth at bedtime.   sodium chloride 0.65 % Soln nasal spray Commonly known as: OCEAN Place 1 spray into both nostrils as needed for congestion.   Vitamin D (Ergocalciferol) 1.25 MG (50000 UNIT) Caps capsule Commonly known as: DRISDOL Take 1 capsule (50,000 Units total) by mouth every 7 (seven) days.         Consults:  None  Significant Diagnostic Studies:  DG Chest Port 1 View  Result Date: 02/08/2023 CLINICAL DATA:  Cough EXAM: PORTABLE CHEST 1 VIEW COMPARISON:  11/19/2022 FINDINGS: The heart size and mediastinal contours are within normal limits. Both lungs are clear. The visualized skeletal structures are unremarkable. IMPRESSION: No active disease. Electronically Signed   By: Donavan Foil M.D.   On: 02/08/2023 17:04    History of present illness:  Cartina Prothero is a 36 year old female with a medical history significant for sickle cell disease, chronic pain syndrome, opiate dependence, and history of cocaine abuse presents with complaints of lower extremity pain that is consistent with her previous  pain crisis.  Patient states that pain intensity increased this morning.  She does not have any home medications.  PCP has been unable to prescribe opiates consistently due to ongoing illicit drug use.  Patient's pain intensity is 8/10, constant, and throbbing.  Patient denies any headache, chest pain, urinary symptoms, nausea, vomiting, or diarrhea.  [facility] Course: Patient admitted for sickle cell pain crisis.  Reviewed all laboratory values,  unremarkable. Pain managed with Dilaudid 1 mg SQ x 3 IV fluids, 0.45% saline at 100 mL/h Toradol 15 mg IV x 1 Tylenol 1000 mg by mouth x 1 Pain intensity decreased to 4/10 and patient is requesting discharge home. She is alert, oriented, and ambulating without assistance.  Patient advised to schedule an appointment with writer for medication management.  Discharge instructions: Resume all home medications.   Follow up with PCP as previously  scheduled.   Discussed the importance of drinking 64 ounces of water daily, dehydration of red blood cells may lead further sickling.   Avoid all stressors that precipitate sickle cell pain crisis.     The patient was given clear instructions to go to ER or return to medical center if symptoms do not improve, worsen or new problems develop.   Physical Exam at Discharge:  BP 126/72 (BP Location: Left Arm)   Pulse 85   Temp 98.1 F (36.7 C) (Temporal)   Resp 16   LMP 01/20/2023   SpO2 98%  Physical Exam Constitutional:      Appearance: Normal appearance.  HENT:     Mouth/Throat:     Mouth: Mucous membranes are moist.  Eyes:     Pupils: Pupils are equal, round, and reactive to light.  Cardiovascular:     Rate and Rhythm: Normal rate.  Pulmonary:     Effort: Pulmonary effort is normal.  Abdominal:     General: Bowel sounds are normal.  Musculoskeletal:        General: Normal range of motion.  Neurological:     General: No focal deficit present.     Mental Status: She is alert. Mental status is at baseline.  Psychiatric:        Mood and Affect: Mood normal.        Behavior: Behavior normal.        Thought Content: Thought content normal.        Judgment: Judgment normal.      Disposition at Discharge: Discharge disposition: 01-Home or Self Care       Discharge Orders: Discharge Instructions     Discharge patient   Complete by: As directed    Discharge disposition: 01-Home or Self Care   Discharge patient date:  02/11/2023       Condition at Discharge:   Stable  Time spent on Discharge:  Greater than 30 minutes.  Signed: Donia Pounds  APRN, MSN, FNP-C [facility] [address] [place], [place] [address] [phone]  02/11/2023, 2:51 PM

## 2023-02-11 NOTE — Progress Notes (Signed)
Pt admitted to day hospital today for sickle cell pain treatment. On arrival, pt rates 10/10 pain to bilateral legs. For pain pt received 3 x subcutaneous doses of Dilaudid 1 mg. Pt also received IV Toradol 15 mg, IV Zofran 4 mg, and hydrated with IV fluids via PIV. AVS given to pt. At discharge, pt rates pain 4/10. Pt is alert, oriented and ambulatory at discharge.

## 2023-02-11 NOTE — Progress Notes (Signed)
$'@Patient'K$  ID: Hannah Hawkins, female    DOB: 01/08/1986, 37 y.o.   MRN: [id number]  Chief Complaint  Patient presents with   Sickle Cell Anemia    Follow up    Referring provider: Fenton Foy, NP   HPI  Hannah Hawkins 37 y.o. female  has a past medical history of Sickle cell anemia ([place]).     Patient arrives this morning addiment about being prescribed controlled pain medications. We discussed that she has tested positive for cocaine several times recently. This has been discussed with Dr. Doreene Burke and [place] Hollis and we will no longer be prescribing this patient controlled substances per [facility] regulations.   She was seen on 01/12/23 and was offered rehab but refused.  Also offered a referral to [facility] or [facility] for sickle management. She has refused these options. We will refer her to pain management clinic for pain management.   Denies f/c/s, n/v/d, hemoptysis, PND, leg swelling Denies chest pain or edema      Allergies  Allergen Reactions   Penicillins Swelling and Rash    Has patient had a PCN reaction causing immediate rash, facial/tongue/throat swelling, SOB or lightheadedness with hypotension: Yes Has patient had a PCN reaction causing severe rash involving mucus membranes or skin necrosis: Yes Has patient had a PCN reaction that required hospitalization No Has patient had a PCN reaction occurring within the last 10 years: no If all of the above answers are "NO", then may proceed with Cephalosporin use.     Immunization History  Administered Date(s) Administered   Pneumococcal Polysaccharide-23 02/18/2017   Tdap 02/18/2017    Past Medical History:  Diagnosis Date   Sickle cell anemia ([place])     Tobacco History: Social History   Tobacco Use  Smoking Status Former   Types: Cigarettes  Smokeless Tobacco Never   Counseling given: Not Answered   Outpatient Encounter Medications as of 02/11/2023  Medication Sig   azelastine (ASTELIN) 0.1 % nasal spray  Place 2 sprays into both nostrils 2 (two) times daily. Use in each nostril as directed   fluticasone (FLONASE) 50 MCG/ACT nasal spray Place 2 sprays into both nostrils daily.   ibuprofen (ADVIL) 600 MG tablet Take 1 tablet (600 mg total) by mouth every 8 (eight) hours as needed.   ondansetron (ZOFRAN) 8 MG tablet Take 1 tablet (8 mg total) by mouth every 8 (eight) hours as needed for nausea or vomiting.   Vitamin D, Ergocalciferol, (DRISDOL) 1.25 MG (50000 UNIT) CAPS capsule Take 1 capsule (50,000 Units total) by mouth every 7 (seven) days.   benzonatate (TESSALON) 100 MG capsule Take 1 capsule (100 mg total) by mouth 2 (two) times daily as needed for cough. (Patient not taking: Reported on 02/11/2023)   lidocaine (HM LIDOCAINE PATCH) 4 % Place 1 patch onto the skin daily. (Patient not taking: Reported on 01/12/2023)   montelukast (SINGULAIR) 10 MG tablet Take 1 tablet (10 mg total) by mouth at bedtime. (Patient not taking: Reported on 01/12/2023)   NARCAN 4 MG/0.1ML LIQD nasal spray kit Place 4 mg into the nose once as needed (overdose). (Patient not taking: Reported on 02/11/2023)   norethindrone (MICRONOR) 0.35 MG tablet Take 1 tablet (0.35 mg total) by mouth daily. (Patient not taking: Reported on 01/12/2023)   oxyCODONE (ROXICODONE) 15 MG immediate release tablet Take 1 tablet (15 mg total) by mouth every 6 (six) hours as needed for pain. (Patient not taking: Reported on 02/11/2023)   pantoprazole (PROTONIX) 40 MG tablet  Take 1 tablet (40 mg total) by mouth daily. (Patient not taking: Reported on 03/17/2022)   risperiDONE (RISPERDAL) 2 MG tablet Take 1 tablet (2 mg total) by mouth at bedtime. (Patient not taking: Reported on 01/12/2023)   sodium chloride (OCEAN) 0.65 % SOLN nasal spray Place 1 spray into both nostrils as needed for congestion. (Patient not taking: Reported on 02/11/2023)   No facility-administered encounter medications on file as of 02/11/2023.     Review of Systems  Review of  Systems  Constitutional: Negative.   HENT: Negative.    Cardiovascular: Negative.   Gastrointestinal: Negative.   Allergic/Immunologic: Negative.   Neurological: Negative.   Psychiatric/Behavioral: Negative.         Physical Exam  Ht '5\' 2"'$  (1.575 m)   Wt 144 lb 12.8 oz (65.7 kg)   LMP 01/20/2023   BMI 26.48 kg/m   Wt Readings from Last 5 Encounters:  02/11/23 144 lb 12.8 oz (65.7 kg)  02/08/23 132 lb 4.4 oz (60 kg)  01/12/23 134 lb (60.8 kg)  12/02/22 128 lb (58.1 kg)  11/18/22 138 lb (62.6 kg)     Physical Exam Vitals and nursing note reviewed.  Constitutional:      General: She is not in acute distress.    Appearance: She is well-developed.  Cardiovascular:     Rate and Rhythm: Normal rate and regular rhythm.  Pulmonary:     Effort: Pulmonary effort is normal.     Breath sounds: Normal breath sounds.  Neurological:     Mental Status: She is alert and oriented to person, place, and time.      Lab Results:  CBC    Component Value Date/Time   WBC 10.2 02/08/2023 1704   RBC 3.96 02/08/2023 1704   RBC 3.96 02/08/2023 1704   HGB 11.3 (L) 02/08/2023 1704   HGB 10.6 (L) 12/02/2022 1057   HCT 30.2 (L) 02/08/2023 1704   HCT 31.5 (L) 12/02/2022 1057   PLT 344 02/08/2023 1704   PLT 425 12/02/2022 1057   MCV 76.3 (L) 02/08/2023 1704   MCV 82 12/02/2022 1057   MCH 28.5 02/08/2023 1704   MCHC 37.4 (H) 02/08/2023 1704   RDW 14.4 02/08/2023 1704   RDW 15.5 (H) 12/02/2022 1057   LYMPHSABS 2.5 02/08/2023 1704   LYMPHSABS 3.7 (H) 12/02/2022 1057   MONOABS 0.9 02/08/2023 1704   EOSABS 1.6 (H) 02/08/2023 1704   EOSABS 0.4 12/02/2022 1057   BASOSABS 0.1 02/08/2023 1704   BASOSABS 0.1 12/02/2022 1057    BMET    Component Value Date/Time   NA 138 02/08/2023 1704   NA 137 12/02/2022 1057   K 3.2 (L) 02/08/2023 1704   CL 109 02/08/2023 1704   CO2 23 02/08/2023 1704   GLUCOSE 94 02/08/2023 1704   BUN 12 02/08/2023 1704   BUN 10 12/02/2022 1057   CREATININE  0.77 02/08/2023 1704   CREATININE 0.69 08/04/2017 1208   CALCIUM 8.2 (L) 02/08/2023 1704   GFRNONAA >60 02/08/2023 1704   GFRNONAA >89 08/04/2017 1208   GFRAA >60 09/23/2020 0658   GFRAA >89 08/04/2017 1208    BNP    Component Value Date/Time   BNP 27.4 03/07/2020 1532    ProBNP No results found for: "PROBNP"  Imaging: DG Chest Port 1 View  Result Date: 02/08/2023 CLINICAL DATA:  Cough EXAM: PORTABLE CHEST 1 VIEW COMPARISON:  11/19/2022 FINDINGS: The heart size and mediastinal contours are within normal limits. Both lungs are clear. The visualized skeletal  structures are unremarkable. IMPRESSION: No active disease. Electronically Signed   By: Donavan Foil M.D.   On: 02/08/2023 17:04     Assessment & Plan:   No problem-specific Assessment & Plan notes found for this encounter.     Fenton Foy, NP 02/11/2023

## 2023-02-18 ENCOUNTER — Other Ambulatory Visit: Payer: Self-pay | Admitting: Nurse Practitioner

## 2023-02-18 MED ORDER — IBUPROFEN 600 MG PO TABS: 600.0000 mg | ORAL_TABLET | Freq: Three times a day (TID) | ORAL | 0 refills | Status: DC | PRN

## 2023-02-26 IMAGING — CR DG CHEST 2V
2 series · 2 of 2 positions shown · non-contrast
Comparison: 08/06/2020

CLINICAL DATA: Chest pain

EXAM:
CHEST - 2 VIEW

[w chest lat]
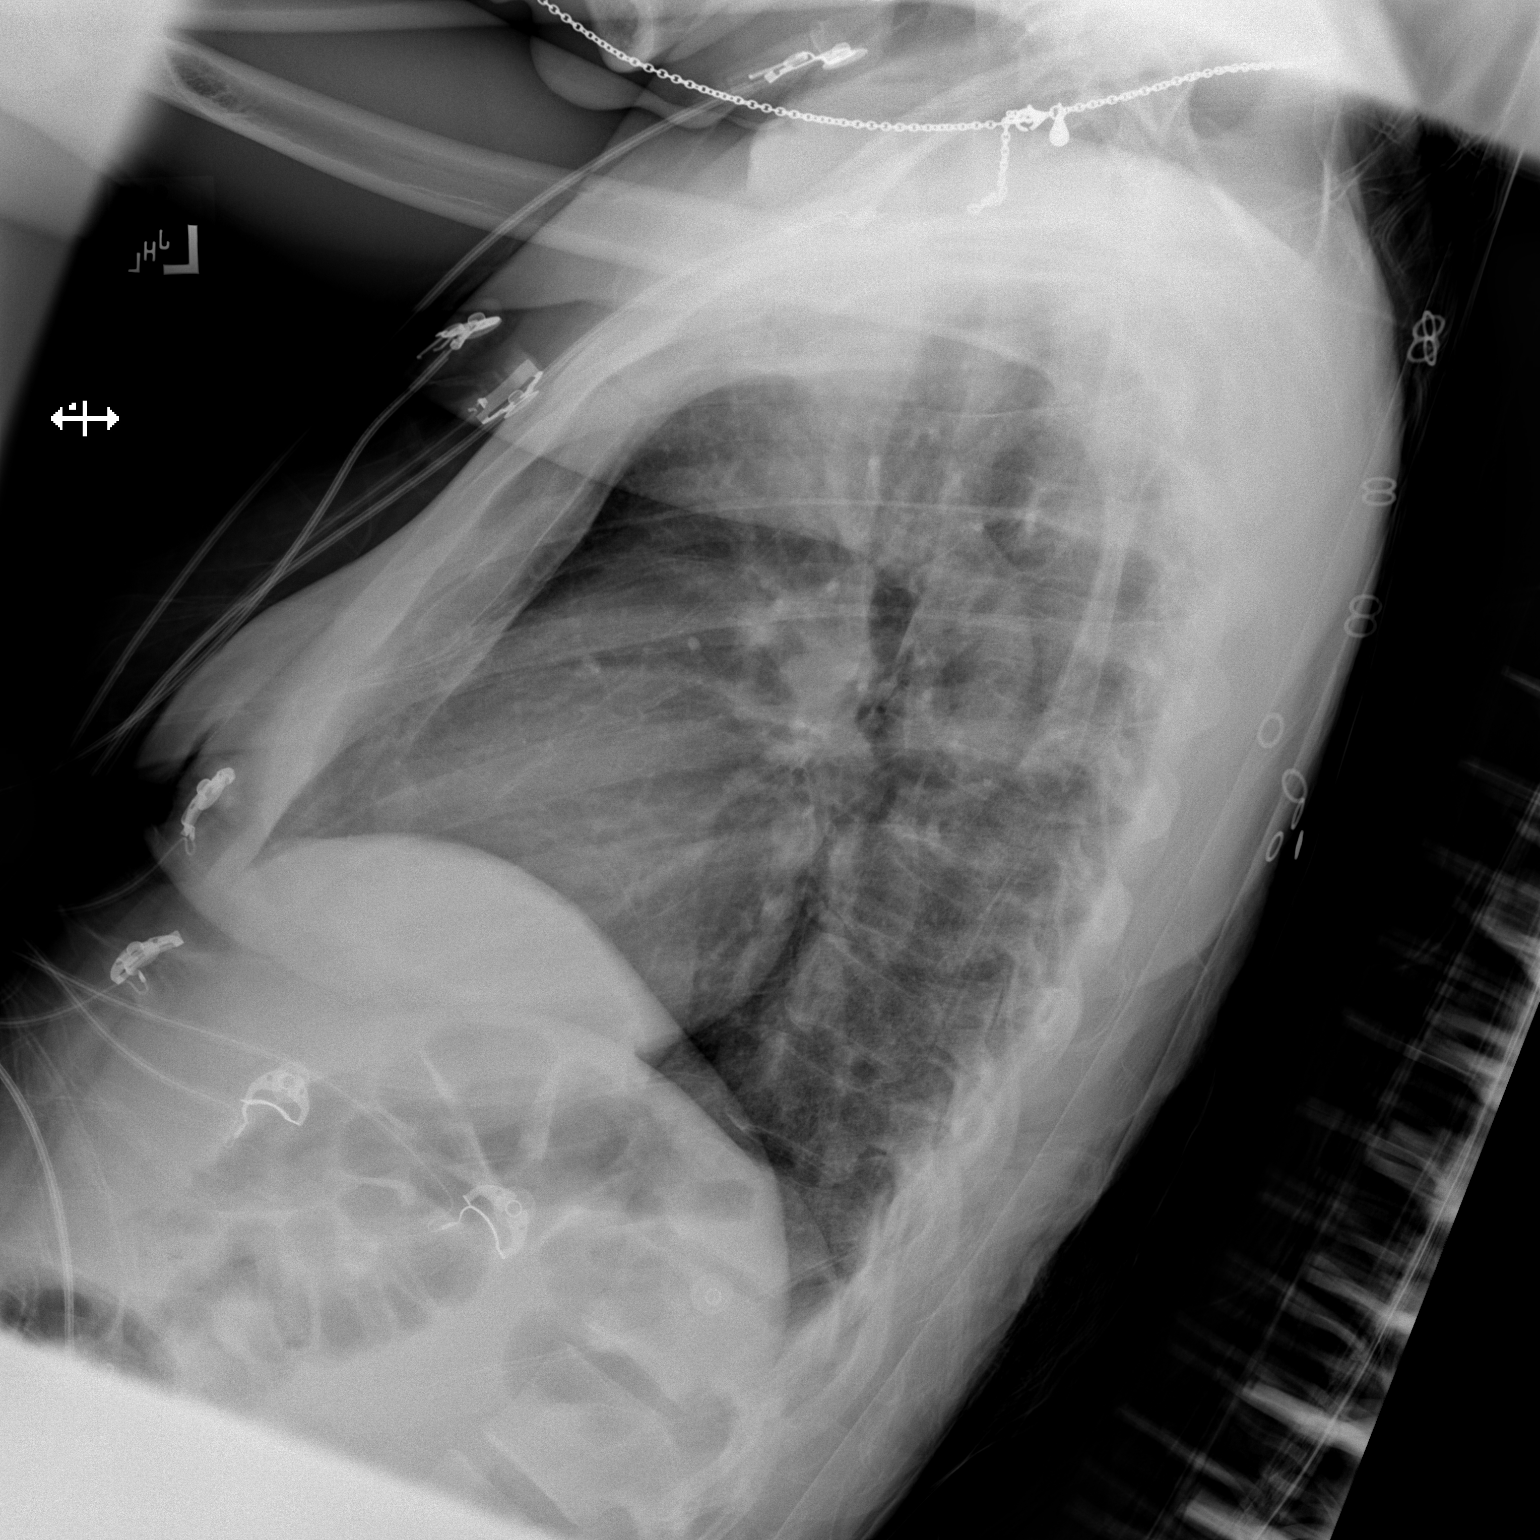

[x chest ap]
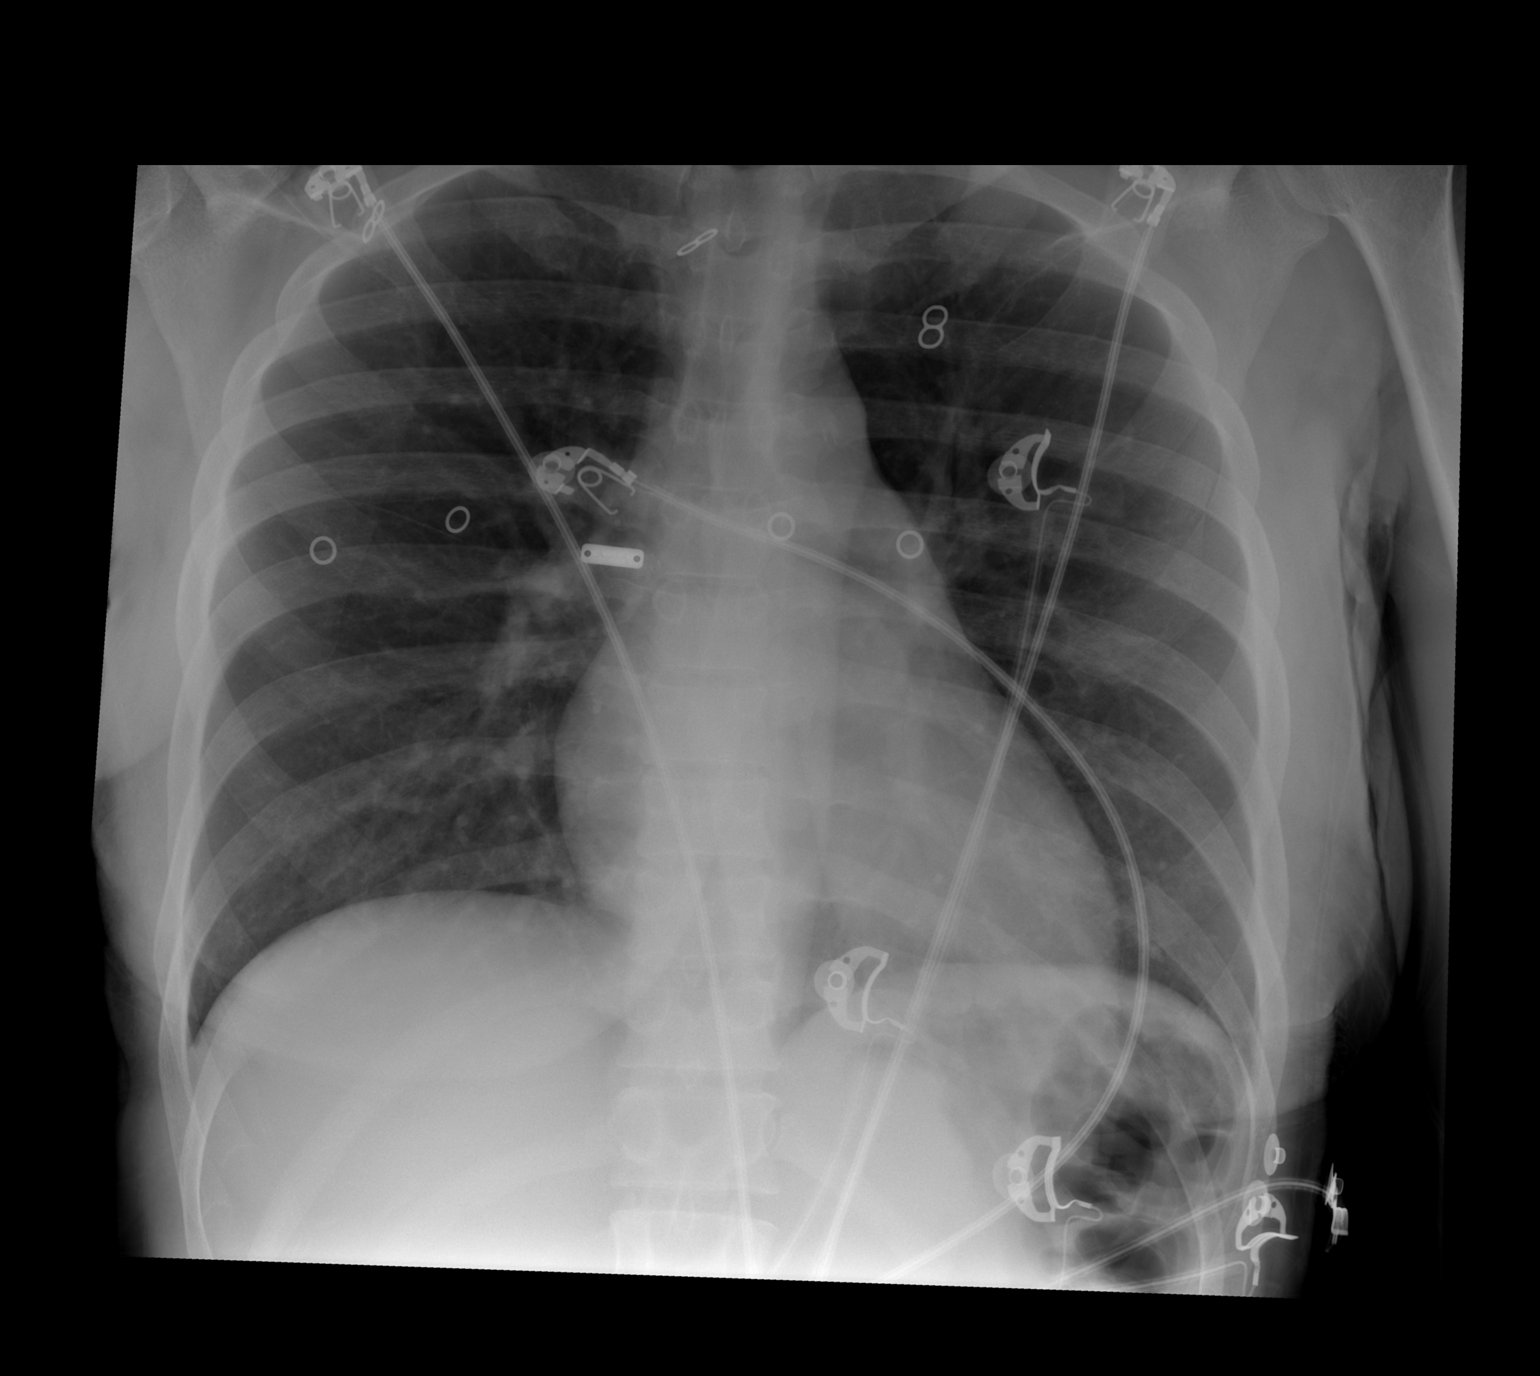

[2 of 2 positions shown; findings below may reference images not displayed]

FINDINGS: The heart size and mediastinal contours are within normal limits.
Both lungs are clear. The visualized skeletal structures are
unremarkable.
IMPRESSION: No active cardiopulmonary disease.

## 2023-03-04 ENCOUNTER — Ambulatory Visit: Payer: Self-pay | Admitting: Nurse Practitioner

## 2023-03-13 ENCOUNTER — Emergency Department (HOSPITAL_COMMUNITY)
Admission: EM | Admit: 2023-03-13 | Discharge: 2023-03-13 | Disposition: A | Discharge status: Home / Self Care | Payer: Medicaid Other | Attending: Emergency Medicine | Admitting: Emergency Medicine

## 2023-03-13 ENCOUNTER — Other Ambulatory Visit: Payer: Self-pay

## 2023-03-13 ENCOUNTER — Encounter (HOSPITAL_COMMUNITY): Payer: Self-pay

## 2023-03-13 DIAGNOSIS — D5700 Hb-SS disease with crisis, unspecified: Secondary | ICD-10-CM

## 2023-03-13 LAB — CBC WITH DIFFERENTIAL/PLATELET
Abs Immature Granulocytes: 0.05 10*3/uL (ref 0.00–0.07)
Basophils Absolute: 0.1 10*3/uL (ref 0.0–0.1)
Basophils Relative: 1 %
Eosinophils Absolute: 1 10*3/uL — ABNORMAL HIGH (ref 0.0–0.5)
Eosinophils Relative: 10 %
HCT: 30.8 % — ABNORMAL LOW (ref 36.0–46.0)
Hemoglobin: 11.3 g/dL — ABNORMAL LOW (ref 12.0–15.0)
Immature Granulocytes: 1 %
Lymphocytes Relative: 36 %
Lymphs Abs: 3.6 10*3/uL (ref 0.7–4.0)
MCH: 28.2 pg (ref 26.0–34.0)
MCHC: 36.7 g/dL — ABNORMAL HIGH (ref 30.0–36.0)
MCV: 76.8 fL — ABNORMAL LOW (ref 80.0–100.0)
Monocytes Absolute: 1 10*3/uL (ref 0.1–1.0)
Monocytes Relative: 10 %
Neutro Abs: 4.1 10*3/uL (ref 1.7–7.7)
Neutrophils Relative %: 42 %
Platelets: 324 10*3/uL (ref 150–400)
RBC: 4.01 MIL/uL (ref 3.87–5.11)
RDW: 13.6 % (ref 11.5–15.5)
WBC: 9.8 10*3/uL (ref 4.0–10.5)
nRBC: 2.8 % — ABNORMAL HIGH (ref 0.0–0.2)

## 2023-03-13 LAB — COMPREHENSIVE METABOLIC PANEL
ALT: 53 U/L — ABNORMAL HIGH (ref 0–44)
AST: 54 U/L — ABNORMAL HIGH (ref 15–41)
Albumin: 3.5 g/dL (ref 3.5–5.0)
Alkaline Phosphatase: 54 U/L (ref 38–126)
Anion gap: 6 (ref 5–15)
BUN: 7 mg/dL (ref 6–20)
CO2: 21 mmol/L — ABNORMAL LOW (ref 22–32)
Calcium: 7.8 mg/dL — ABNORMAL LOW (ref 8.9–10.3)
Chloride: 108 mmol/L (ref 98–111)
Creatinine, Ser: 0.81 mg/dL (ref 0.44–1.00)
GFR, Estimated: >60 mL/min (ref >60)
Glucose, Bld: 130 mg/dL — ABNORMAL HIGH (ref 70–99)
Potassium: 3.8 mmol/L (ref 3.5–5.1)
Sodium: 135 mmol/L (ref 135–145)
Total Bilirubin: 0.7 mg/dL (ref 0.3–1.2)
Total Protein: 6.2 g/dL — ABNORMAL LOW (ref 6.5–8.1)

## 2023-03-13 LAB — RETICULOCYTES
Immature Retic Fract: 32.8 % — ABNORMAL HIGH (ref 2.3–15.9)
RBC.: 3.98 MIL/uL (ref 3.87–5.11)
Retic Count, Absolute: 163.6 10*3/uL (ref 19.0–186.0)
Retic Ct Pct: 4.1 % — ABNORMAL HIGH (ref 0.4–3.1)

## 2023-03-13 LAB — URINALYSIS, ROUTINE W REFLEX MICROSCOPIC
Bilirubin Urine: NEGATIVE
Glucose, UA: NEGATIVE mg/dL
Ketones, ur: NEGATIVE mg/dL
Leukocytes,Ua: NEGATIVE
Nitrite: NEGATIVE
Protein, ur: NEGATIVE mg/dL
Specific Gravity, Urine: 1.009 (ref 1.005–1.030)
pH: 6 (ref 5.0–8.0)

## 2023-03-13 MED ORDER — SODIUM CHLORIDE 0.45 % IV SOLN: INTRAVENOUS | Status: DC

## 2023-03-13 MED ORDER — HYDROMORPHONE HCL 2 MG/ML IJ SOLN: 2.0000 mg | INTRAMUSCULAR | Status: AC

## 2023-03-13 MED ORDER — HYDROMORPHONE HCL 2 MG/ML IJ SOLN
2.0000 mg | INTRAMUSCULAR | Status: AC
  Administered 2023-03-13: 2 mg via INTRAVENOUS
  Filled 2023-03-13: qty 1

## 2023-03-13 MED ORDER — OXYCODONE HCL 5 MG PO TABS
15.0000 mg | ORAL_TABLET | Freq: Once | ORAL | Status: AC
  Administered 2023-03-13: 15 mg via ORAL
  Filled 2023-03-13: qty 3

## 2023-03-13 MED ORDER — SODIUM CHLORIDE 0.9 % IV SOLN
12.5000 mg | Freq: Once | INTRAVENOUS | Status: AC
  Administered 2023-03-13: 12.5 mg via INTRAVENOUS
  Filled 2023-03-13: qty 0.25
  Filled 2023-03-13: qty 12.5

## 2023-03-13 MED ORDER — ONDANSETRON HCL 4 MG/2ML IJ SOLN
4.0000 mg | INTRAMUSCULAR | Status: DC | PRN
  Administered 2023-03-13: 4 mg via INTRAVENOUS
  Filled 2023-03-13: qty 2

## 2023-03-13 NOTE — ED Triage Notes (Signed)
Arrives EMS from home with SCC pain for last coupe days. Typically has right flank pain at baseline but began having bilateral hand/foot swelling and pointer finger pains.   Prior to arrival 131mcg fentanyl  500cc NS

## 2023-03-13 NOTE — ED Provider Notes (Addendum)
[place] EMERGENCY DEPARTMENT AT [facility] Provider Note   CSN: [id number] Arrival date & time: 03/13/23  2044     History  Chief Complaint  Patient presents with   Sickle Cell Pain Crisis    Hannah Hawkins is a 37 y.o. female.  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  States that this is similar to her prior sickle cell crisis.  Denies any fever, cough.  She has not been short of breath.  She has not had any nausea or vomiting.  Denies any urinary symptoms.  Pain is in her back and her legs.  Used her home medications without relief       Home Medications Prior to Admission medications   Medication Sig Start Date End Date Taking? Authorizing Provider  azelastine (ASTELIN) 0.1 % nasal spray Place 2 sprays into both nostrils 2 (two) times daily. Use in each nostril as directed 11/08/22   Paseda, Dewaine Conger, FNP  benzonatate (TESSALON) 100 MG capsule Take 1 capsule (100 mg total) by mouth 2 (two) times daily as needed for cough. Patient not taking: Reported on 02/11/2023 11/19/22   Renee Rival, FNP  fluticasone (FLONASE) 50 MCG/ACT nasal spray Place 2 sprays into both nostrils daily. 08/14/21   Vevelyn Francois, NP  ibuprofen (ADVIL) 600 MG tablet Take 1 tablet (600 mg total) by mouth every 8 (eight) hours as needed. 02/18/23   Fenton Foy, NP  lidocaine (HM LIDOCAINE PATCH) 4 % Place 1 patch onto the skin daily. 12/08/22   Fenton Foy, NP  montelukast (SINGULAIR) 10 MG tablet Take 1 tablet (10 mg total) by mouth at bedtime. Patient not taking: Reported on 01/12/2023 08/14/21   Vevelyn Francois, NP  [facility] 4 MG/0.1ML LIQD nasal spray kit Place 4 mg into the nose once as needed (overdose). Patient not taking: Reported on 02/11/2023 12/22/17   [provider]  norethindrone (MICRONOR) 0.35 MG tablet Take 1 tablet (0.35 mg total) by mouth daily. Patient not taking: Reported on 01/12/2023 09/08/21   Vevelyn Francois, NP  ondansetron (ZOFRAN) 8 MG tablet Take 1 tablet (8 mg total) by mouth every 8 (eight) hours as needed for nausea or vomiting. 01/12/23   Fenton Foy, NP  oxyCODONE (ROXICODONE) 15 MG immediate release tablet Take 1 tablet (15 mg total) by mouth every 6 (six) hours as needed for pain. Patient not taking: Reported on 02/11/2023 12/23/22   Fenton Foy, NP  pantoprazole (PROTONIX) 40 MG tablet Take 1 tablet (40 mg total) by mouth daily. Patient not taking: Reported on 03/17/2022 09/07/21 09/07/22  Vevelyn Francois, NP  risperiDONE (RISPERDAL) 2 MG tablet Take 1 tablet (2 mg total) by mouth at bedtime. Patient not taking: Reported on 01/12/2023 05/10/22   Revonda Humphrey, NP  sodium chloride (OCEAN) 0.65 % SOLN nasal spray Place 1 spray into both nostrils as needed for congestion. Patient not taking: Reported on 02/11/2023 11/08/22   Renee Rival, FNP  Vitamin D, Ergocalciferol, (DRISDOL) 1.25 MG (50000 UNIT) CAPS capsule Take 1 capsule (50,000 Units total) by mouth every 7 (seven) days. 12/06/22   Fenton Foy, NP      Allergies    Penicillins    Review of Systems   Review of Systems  All other systems reviewed and are negative.   Physical Exam Updated Vital Signs BP 105/89   Pulse (!) 57   Temp 98 F (36.7  C)   Resp 12   Ht 1.575 m (5\' 2" )   Wt 65.3 kg   SpO2 98%   BMI 26.34 kg/m  Physical Exam Vitals and nursing note reviewed.  Constitutional:      General: She is not in acute distress.    Appearance: Normal appearance. She is well-developed. She is not toxic-appearing.  HENT:     Head: Normocephalic and atraumatic.  Eyes:     General: Lids are normal.     Conjunctiva/sclera: Conjunctivae normal.     Pupils: Pupils are equal, round, and reactive to light.  Neck:     Thyroid: No thyroid mass.     Trachea: No tracheal deviation.  Cardiovascular:     Rate and Rhythm: Normal rate and regular rhythm.     Heart sounds: Normal heart sounds. No murmur  heard.    No gallop.  Pulmonary:     Effort: Pulmonary effort is normal. No respiratory distress.     Breath sounds: Normal breath sounds. No stridor. No decreased breath sounds, wheezing, rhonchi or rales.  Abdominal:     General: There is no distension.     Palpations: Abdomen is soft.     Tenderness: There is no abdominal tenderness. There is no rebound.  Musculoskeletal:        General: No tenderness. Normal range of motion.     Cervical back: Normal range of motion and neck supple.  Skin:    General: Skin is warm and dry.     Findings: No abrasion or rash.  Neurological:     Mental Status: She is alert and oriented to person, place, and time. Mental status is at baseline.     GCS: GCS eye subscore is 4. GCS verbal subscore is 5. GCS motor subscore is 6.     Cranial Nerves: No cranial nerve deficit.     Sensory: No sensory deficit.     Motor: Motor function is intact.  Psychiatric:        Attention and Perception: Attention normal.        Speech: Speech normal.        Behavior: Behavior normal.     ED Results / Procedures / Treatments   Labs (all labs ordered are listed, but only abnormal results are displayed) Labs Reviewed  RETICULOCYTES  CBC WITH DIFFERENTIAL/PLATELET  COMPREHENSIVE METABOLIC PANEL  URINALYSIS, ROUTINE W REFLEX MICROSCOPIC    EKG None  Radiology No results found.  Procedures Procedures    Medications Ordered in ED Medications  0.45 % sodium chloride infusion ( Intravenous New Bag/Given 03/13/23 2129)  HYDROmorphone (DILAUDID) injection 2 mg (has no administration in time range)  HYDROmorphone (DILAUDID) injection 2 mg (has no administration in time range)  diphenhydrAMINE (BENADRYL) 12.5 mg in sodium chloride 0.9 % 50 mL IVPB (has no administration in time range)  ondansetron (ZOFRAN) injection 4 mg (has no administration in time range)  HYDROmorphone (DILAUDID) injection 2 mg (2 mg Intravenous Given 03/13/23 2132)    ED Course/ Medical  Decision Making/ A&P                             Medical Decision Making Amount and/or Complexity of Data Reviewed Labs: ordered.  Risk Prescription drug management.   Patient given IV fluid as well as pain medication here.  Hemoglobin is stable.  Sickle cell count was elevated.  No concern for aplastic crisis.  Pain controlled at time of  discharge.  11:17 PM Called back to room by patient who is requesting home medications.  Patient states that she had a positive drug test and therefore is looking for a new pain management specialist.  She was given 1 dose of her oral home medication.        Final Clinical Impression(s) / ED Diagnoses Final diagnoses:  None    Rx / DC Orders ED Discharge Orders     None         Lacretia Leigh, MD 03/13/23 2248    Lacretia Leigh, MD 03/13/23 2318

## 2023-03-24 ENCOUNTER — Telehealth: Payer: Self-pay | Admitting: Nurse Practitioner

## 2023-03-24 NOTE — Telephone Encounter (Signed)
Pt called asking about her PAIN mgnt referral

## 2023-04-12 ENCOUNTER — Encounter (HOSPITAL_COMMUNITY): Payer: Self-pay | Admitting: *Deleted

## 2023-04-12 ENCOUNTER — Other Ambulatory Visit: Payer: Self-pay

## 2023-04-12 ENCOUNTER — Observation Stay (HOSPITAL_COMMUNITY)
Admission: EM | Admit: 2023-04-12 | Discharge: 2023-04-13 | Disposition: A | Discharge status: Home / Self Care | Payer: Medicaid Other | Attending: Internal Medicine | Admitting: Internal Medicine

## 2023-04-12 DIAGNOSIS — N189 Chronic kidney disease, unspecified: Secondary | ICD-10-CM | POA: Diagnosis not present

## 2023-04-12 DIAGNOSIS — D631 Anemia in chronic kidney disease: Secondary | ICD-10-CM | POA: Diagnosis not present

## 2023-04-12 DIAGNOSIS — Z87891 Personal history of nicotine dependence: Secondary | ICD-10-CM | POA: Insufficient documentation

## 2023-04-12 DIAGNOSIS — G894 Chronic pain syndrome: Secondary | ICD-10-CM | POA: Diagnosis not present

## 2023-04-12 DIAGNOSIS — D638 Anemia in other chronic diseases classified elsewhere: Secondary | ICD-10-CM | POA: Diagnosis present

## 2023-04-12 DIAGNOSIS — Z91040 Latex allergy status: Secondary | ICD-10-CM | POA: Diagnosis not present

## 2023-04-12 DIAGNOSIS — Z79899 Other long term (current) drug therapy: Secondary | ICD-10-CM | POA: Insufficient documentation

## 2023-04-12 DIAGNOSIS — D57219 Sickle-cell/Hb-C disease with crisis, unspecified: Secondary | ICD-10-CM | POA: Diagnosis not present

## 2023-04-12 DIAGNOSIS — D5700 Hb-SS disease with crisis, unspecified: Secondary | ICD-10-CM | POA: Diagnosis present

## 2023-04-12 LAB — CBC WITH DIFFERENTIAL/PLATELET
Abs Immature Granulocytes: 0.07 10*3/uL (ref 0.00–0.07)
Basophils Absolute: 0.1 10*3/uL (ref 0.0–0.1)
Basophils Relative: 1 %
Eosinophils Absolute: 0.5 10*3/uL (ref 0.0–0.5)
Eosinophils Relative: 4 %
HCT: 35.1 % — ABNORMAL LOW (ref 36.0–46.0)
Hemoglobin: 12.7 g/dL (ref 12.0–15.0)
Immature Granulocytes: 1 %
Lymphocytes Relative: 24 %
Lymphs Abs: 2.9 10*3/uL (ref 0.7–4.0)
MCH: 28.2 pg (ref 26.0–34.0)
MCHC: 36.2 g/dL — ABNORMAL HIGH (ref 30.0–36.0)
MCV: 77.8 fL — ABNORMAL LOW (ref 80.0–100.0)
Monocytes Absolute: 1.2 10*3/uL — ABNORMAL HIGH (ref 0.1–1.0)
Monocytes Relative: 10 %
Neutro Abs: 7.7 10*3/uL (ref 1.7–7.7)
Neutrophils Relative %: 60 %
Platelets: 320 10*3/uL (ref 150–400)
RBC: 4.51 MIL/uL (ref 3.87–5.11)
RDW: 14.7 % (ref 11.5–15.5)
WBC: 12.4 10*3/uL — ABNORMAL HIGH (ref 4.0–10.5)
nRBC: 4.3 % — ABNORMAL HIGH (ref 0.0–0.2)

## 2023-04-12 LAB — RETICULOCYTES
Immature Retic Fract: 38.7 % — ABNORMAL HIGH (ref 2.3–15.9)
RBC.: 4.42 MIL/uL (ref 3.87–5.11)
Retic Count, Absolute: 178.1 10*3/uL (ref 19.0–186.0)
Retic Ct Pct: 4 % — ABNORMAL HIGH (ref 0.4–3.1)

## 2023-04-12 LAB — COMPREHENSIVE METABOLIC PANEL
ALT: 48 U/L — ABNORMAL HIGH (ref 0–44)
AST: 67 U/L — ABNORMAL HIGH (ref 15–41)
Albumin: 4.2 g/dL (ref 3.5–5.0)
Alkaline Phosphatase: 59 U/L (ref 38–126)
Anion gap: 12 (ref 5–15)
BUN: 5 mg/dL — ABNORMAL LOW (ref 6–20)
CO2: 21 mmol/L — ABNORMAL LOW (ref 22–32)
Calcium: 8.4 mg/dL — ABNORMAL LOW (ref 8.9–10.3)
Chloride: 104 mmol/L (ref 98–111)
Creatinine, Ser: 0.66 mg/dL (ref 0.44–1.00)
GFR, Estimated: >60 mL/min (ref >60)
Glucose, Bld: 111 mg/dL — ABNORMAL HIGH (ref 70–99)
Potassium: 3.5 mmol/L (ref 3.5–5.1)
Sodium: 137 mmol/L (ref 135–145)
Total Bilirubin: 1 mg/dL (ref 0.3–1.2)
Total Protein: 8 g/dL (ref 6.5–8.1)

## 2023-04-12 LAB — I-STAT BETA HCG BLOOD, ED (MC, WL, AP ONLY): I-stat hCG, quantitative: <5 m[IU]/mL (ref <5)

## 2023-04-12 MED ORDER — ONDANSETRON HCL 4 MG/2ML IJ SOLN
4.0000 mg | Freq: Four times a day (QID) | INTRAMUSCULAR | Status: DC | PRN
  Administered 2023-04-12: 4 mg via INTRAVENOUS
  Filled 2023-04-12: qty 2

## 2023-04-12 MED ORDER — ENOXAPARIN SODIUM 40 MG/0.4ML IJ SOSY
40.0000 mg | PREFILLED_SYRINGE | INTRAMUSCULAR | Status: DC
  Filled 2023-04-12: qty 0.4

## 2023-04-12 MED ORDER — OXYCODONE HCL 5 MG PO TABS: 10.0000 mg | ORAL_TABLET | Freq: Once | ORAL | Status: DC

## 2023-04-12 MED ORDER — KETOROLAC TROMETHAMINE 15 MG/ML IJ SOLN
15.0000 mg | Freq: Once | INTRAMUSCULAR | Status: AC
  Administered 2023-04-12: 15 mg via INTRAVENOUS
  Filled 2023-04-12: qty 1

## 2023-04-12 MED ORDER — ACETAMINOPHEN 500 MG PO TABS
1000.0000 mg | ORAL_TABLET | Freq: Once | ORAL | Status: AC
  Administered 2023-04-12: 1000 mg via ORAL
  Filled 2023-04-12: qty 2

## 2023-04-12 MED ORDER — POLYETHYLENE GLYCOL 3350 17 G PO PACK: 17.0000 g | PACK | Freq: Every day | ORAL | Status: DC | PRN

## 2023-04-12 MED ORDER — HYDROMORPHONE 1 MG/ML IV SOLN
INTRAVENOUS | Status: DC
  Administered 2023-04-12: 30 mg via INTRAVENOUS
  Administered 2023-04-12: 2.1 mg via INTRAVENOUS
  Administered 2023-04-12: 0.3 mg via INTRAVENOUS
  Filled 2023-04-12: qty 30

## 2023-04-12 MED ORDER — FLUTICASONE PROPIONATE 50 MCG/ACT NA SUSP
2.0000 | Freq: Every day | NASAL | Status: DC
  Filled 2023-04-12: qty 16

## 2023-04-12 MED ORDER — LIDOCAINE 5 % EX PTCH
1.0000 | MEDICATED_PATCH | CUTANEOUS | Status: DC
  Administered 2023-04-13: 1 via TRANSDERMAL
  Filled 2023-04-12: qty 1

## 2023-04-12 MED ORDER — HYDROMORPHONE HCL 1 MG/ML IJ SOLN: 1.0000 mg | INTRAMUSCULAR | Status: DC | PRN

## 2023-04-12 MED ORDER — MONTELUKAST SODIUM 10 MG PO TABS: 10.0000 mg | ORAL_TABLET | Freq: Every day | ORAL | Status: DC

## 2023-04-12 MED ORDER — KETOROLAC TROMETHAMINE 15 MG/ML IJ SOLN
15.0000 mg | Freq: Four times a day (QID) | INTRAMUSCULAR | Status: DC
  Administered 2023-04-12 – 2023-04-13 (×2): 15 mg via INTRAVENOUS
  Filled 2023-04-12 (×2): qty 1

## 2023-04-12 MED ORDER — SODIUM CHLORIDE 0.45 % IV SOLN: INTRAVENOUS | Status: DC

## 2023-04-12 MED ORDER — LACTATED RINGERS IV BOLUS: 1000.0000 mL | Freq: Once | INTRAVENOUS | Status: DC

## 2023-04-12 MED ORDER — SENNOSIDES-DOCUSATE SODIUM 8.6-50 MG PO TABS
1.0000 | ORAL_TABLET | Freq: Two times a day (BID) | ORAL | Status: DC
  Filled 2023-04-12: qty 1

## 2023-04-12 MED ORDER — SODIUM CHLORIDE 0.9% FLUSH: 9.0000 mL | INTRAVENOUS | Status: DC | PRN

## 2023-04-12 MED ORDER — HYDROMORPHONE HCL 1 MG/ML IJ SOLN: 0.5000 mg | INTRAMUSCULAR | Status: DC

## 2023-04-12 MED ORDER — NALOXONE HCL 0.4 MG/ML IJ SOLN: 0.4000 mg | INTRAMUSCULAR | Status: DC | PRN

## 2023-04-12 MED ORDER — HYDROMORPHONE HCL 1 MG/ML IJ SOLN
0.5000 mg | INTRAMUSCULAR | Status: DC
  Administered 2023-04-12: 0.5 mg via SUBCUTANEOUS
  Filled 2023-04-12: qty 1

## 2023-04-12 MED ORDER — HYDROMORPHONE HCL 1 MG/ML IJ SOLN
1.0000 mg | Freq: Once | INTRAMUSCULAR | Status: AC
  Administered 2023-04-12: 1 mg via INTRAVENOUS
  Filled 2023-04-12: qty 1

## 2023-04-12 NOTE — Progress Notes (Signed)
04/12/2023  2300  Patient has order for tele. Patient continues to pull off tele monitor. Has educated patient that the monitor is to help monitor her while here in the hospital but she still refuses to wear tele monitor.

## 2023-04-12 NOTE — ED Provider Notes (Signed)
[place] EMERGENCY DEPARTMENT AT [facility] Provider Note   CSN: [id number] Arrival date & time: 04/12/23  1329     History Chief Complaint  Patient presents with   Sickle Cell Pain Crisis    HPI Hannah Hawkins is a 37 y.o. female presenting for severe full body pain.  States it is most concentrated in her bilateral thighs.  Denies fevers chills nausea vomiting syncope or shortness of breath.  Otherwise ambulatory tolerating p.o. intake on arrival. Came in via EMS given 100 mcg of fentanyl, 0.5 mg of Dilaudid on arrival per nursing protocol By time my evaluation she is still in severe pain. States she does not have any pain medicine at home because she failed a cocaine screen.  Patient's recorded medical, surgical, social, medication list and allergies were reviewed in the Snapshot window as part of the initial history.   Review of Systems   Review of Systems  Constitutional:  Negative for chills and fever.  HENT:  Negative for ear pain and sore throat.   Eyes:  Negative for pain and visual disturbance.  Respiratory:  Negative for cough and shortness of breath.   Cardiovascular:  Negative for chest pain and palpitations.  Gastrointestinal:  Negative for abdominal pain and vomiting.  Genitourinary:  Negative for dysuria and hematuria.  Musculoskeletal:  Positive for arthralgias, joint swelling and myalgias. Negative for back pain.  Skin:  Negative for color change and rash.  Neurological:  Negative for seizures and syncope.  All other systems reviewed and are negative.   Physical Exam Updated Vital Signs BP (!) 119/90   Pulse 87   Temp 98.1 F (36.7 C) (Oral)   Resp 11   SpO2 96%  Physical Exam Vitals and nursing note reviewed.  Constitutional:      General: She is not in acute distress.    Appearance: She is well-developed.  HENT:     Head: Normocephalic and atraumatic.  Eyes:     Conjunctiva/sclera: Conjunctivae normal.  Cardiovascular:     Rate  and Rhythm: Normal rate and regular rhythm.     Heart sounds: No murmur heard. Pulmonary:     Effort: Pulmonary effort is normal. No respiratory distress.     Breath sounds: Normal breath sounds.  Abdominal:     General: There is no distension.     Palpations: Abdomen is soft.     Tenderness: There is no abdominal tenderness. There is no right CVA tenderness or left CVA tenderness.  Musculoskeletal:        General: No swelling or tenderness. Normal range of motion.     Cervical back: Neck supple.  Skin:    General: Skin is warm and dry.  Neurological:     General: No focal deficit present.     Mental Status: She is alert and oriented to person, place, and time. Mental status is at baseline.     Cranial Nerves: No cranial nerve deficit.      ED Course/ Medical Decision Making/ A&P Clinical Course as of 04/12/23 1654  Tue Apr 12, 2023  1557 Consult to IP [facility] [CC]  1558 Sickle cell clinic provider is planning to come evaluate patient at bedside [CC]    Clinical Course User Index [CC] Glyn Ade, MD    Procedures .Critical Care  Performed by: Glyn Ade, MD Authorized by: Glyn Ade, MD   Critical care provider statement:    Critical care time (minutes):  30   Critical care was necessary to  treat or prevent imminent or life-threatening deterioration of the following conditions: 3+ doses of IV opiates for management of pain.   Critical care was time spent personally by me on the following activities:  Development of treatment plan with patient or surrogate, discussions with consultants, evaluation of patient's response to treatment, examination of patient, ordering and review of laboratory studies, ordering and review of radiographic studies, ordering and performing treatments and interventions, pulse oximetry, re-evaluation of patient's condition and review of old charts    Medications Ordered in ED Medications  0.45 % sodium chloride infusion (  Intravenous New Bag/Given 04/12/23 1447)  HYDROmorphone (DILAUDID) injection 1 mg (1 mg Intravenous Given 04/12/23 1445)  acetaminophen (TYLENOL) tablet 1,000 mg (1,000 mg Oral Given 04/12/23 1447)  ketorolac (TORADOL) 15 MG/ML injection 15 mg (15 mg Intravenous Given 04/12/23 1445)    Medical Decision Making:    Hannah Hawkins is a 37 y.o. female who presented to the ED today with acute full body pain over the last 48 hours detailed above.    Has a history of sickle cell.  Follows with the sickle cell clinic.  Called them yesterday and they recommended she come to the hospital for evaluation. States that she has not had any of her pain medication after failing a drug screen in the outpatient setting.  Pain is severe here.  Evaluation for critical pathology initiated including acute chest syndrome which seems unlikely based on this presentation, hemolytic process, sickle cell crisis. Lab work initiated and grossly reassuring.  Given reassuring evaluation, sickle cell team was consulted who came down and evaluated at bedside.  They recommended admission which was initiated.  Disposition:   Based on the above findings, I believe this patient is stable for admission.    Patient/family educated about specific findings on our evaluation and explained exact reasons for admission.  Patient/family educated about clinical situation and time was allowed to answer questions.   Admission team communicated with and agreed with need for admission. Patient admitted. Patient ready to move at this time.     Emergency Department Medication Summary:   Medications  0.45 % sodium chloride infusion ( Intravenous New Bag/Given 04/12/23 1447)  HYDROmorphone (DILAUDID) injection 1 mg (1 mg Intravenous Given 04/12/23 1445)  acetaminophen (TYLENOL) tablet 1,000 mg (1,000 mg Oral Given 04/12/23 1447)  ketorolac (TORADOL) 15 MG/ML injection 15 mg (15 mg Intravenous Given 04/12/23 1445)          Clinical Impression:   1. Sickle cell pain crisis      Admit   Final Clinical Impression(s) / ED Diagnoses Final diagnoses:  Sickle cell pain crisis    Rx / DC Orders ED Discharge Orders     None         Glyn Ade, MD 04/12/23 1654

## 2023-04-12 NOTE — Plan of Care (Signed)
  Problem: Sensory: Goal: Pain level will decrease with appropriate interventions Outcome: Progressing   Problem: Activity: Goal: Risk for activity intolerance will decrease Outcome: Progressing   Problem: Nutrition: Goal: Adequate nutrition will be maintained Outcome: Progressing

## 2023-04-12 NOTE — H&P (Signed)
H&P  Patient Demographics:  Hannah Hawkins, is a 37 y.o. female  MRN: [id number]   DOB - 16-Sep-1986  Admit Date - 04/12/2023  Outpatient Primary MD for the patient is Ivonne Andrew, NP  Chief Complaint  Patient presents with   Sickle Cell Pain Crisis      HPI:   Hannah Hawkins  is a 37 y.o. female with a medical history significant for sickle cell disease, chronic pain syndrome, history of cocaine abuse, and anemia of chronic disease presents to the emergency with complaints of low back and lower extremity pain for 1 day. Patient attributes pain crisis to changes in weather. Also, [name] states that she has not had any pain medications at home. She has not been getting her home medications due to past cocaine use. She last had Ibuprofen this am without sustained relief. Patient currently rates pain at 10/10, constant, and sharp. Patient denies fever, chills, shortness of breath, or chest pain. No urinary symptoms, nausea, vomiting, or diarrhea. No sick contacts, recent travel, or known exposure to COVID-19.   ER Course:  Vital signs show: BP (!) 134/97 (BP Location: Left Arm)   Pulse 85   Temp 97.6 F (36.4 C) (Oral)   Resp 18   Ht 5\' 6"  (1.676 m)   Wt 71.1 kg   SpO2 100%   BMI 25.30 kg/m  Complete blood count notable for WBCs 12.4, hemoglobin 12.7, and platelets 227,000.  CMP unremarkable.  Patient's pain persists despite IV fluids and IV Dilaudid.  Admitted in observation for further management sickle cell pain crisis   Review of systems:  Review of Systems  Constitutional: Negative.  Negative for chills and fever.  HENT: Negative.    Eyes: Negative.   Respiratory: Negative.    Cardiovascular:  Negative for chest pain.  Gastrointestinal: Negative.   Genitourinary: Negative.   Musculoskeletal:  Positive for back pain and joint pain.  Neurological: Negative.   Endo/Heme/Allergies: Negative.   Psychiatric/Behavioral: Negative.      With Past History of the  following :   Past Medical History:  Diagnosis Date   Sickle cell anemia (HCC)       Past Surgical History:  Procedure Laterality Date   CHOLECYSTECTOMY N/A 06/03/2021   Procedure: LAPAROSCOPIC CHOLECYSTECTOMY;  Surgeon: Abigail Miyamoto, MD;  Location: [facility] ORS;  Service: General;  Laterality: N/A;   EYE SURGERY  03/09/2017   left eye   EYE SURGERY Right 09/2019   right [place]merage      Social History:   Social History   Tobacco Use   Smoking status: Former    Types: Cigarettes   Smokeless tobacco: Never  Substance Use Topics   Alcohol use: Not Currently     Lives - At home   Family History :   Family History  Problem Relation Age of Onset   Diabetes Father      Home Medications:   Prior to Admission medications   Medication Sig Start Date End Date Taking? Authorizing Provider  azelastine (ASTELIN) 0.1 % nasal spray Place 2 sprays into both nostrils 2 (two) times daily. Use in each nostril as directed 11/08/22   Paseda, Baird Kay, FNP  fluticasone (FLONASE) 50 MCG/ACT nasal spray Place 2 sprays into both nostrils daily. 08/14/21   Barbette Merino, NP  ibuprofen (ADVIL) 600 MG tablet Take 1 tablet (600 mg total) by mouth every 8 (eight) hours as needed. 02/18/23   Ivonne Andrew, NP  lidocaine (HM LIDOCAINE PATCH) 4 %  Place 1 patch onto the skin daily. 12/08/22   Ivonne Andrew, NP  montelukast (SINGULAIR) 10 MG tablet Take 1 tablet (10 mg total) by mouth at bedtime. Patient not taking: Reported on 01/12/2023 08/14/21   Barbette Merino, NP  [facility] 4 MG/0.1ML LIQD nasal spray kit Place 4 mg into the nose once as needed (overdose). Patient not taking: Reported on 02/11/2023 12/22/17   [provider]  norethindrone (MICRONOR) 0.35 MG tablet Take 1 tablet (0.35 mg total) by mouth daily. Patient not taking: Reported on 01/12/2023 09/08/21   Barbette Merino, NP  ondansetron (ZOFRAN) 8 MG tablet Take 1 tablet (8 mg total) by mouth every 8 (eight) hours as needed for  nausea or vomiting. 01/12/23   Ivonne Andrew, NP  oxyCODONE (ROXICODONE) 15 MG immediate release tablet Take 1 tablet (15 mg total) by mouth every 6 (six) hours as needed for pain. Patient not taking: Reported on 02/11/2023 12/23/22   Ivonne Andrew, NP  pantoprazole (PROTONIX) 40 MG tablet Take 1 tablet (40 mg total) by mouth daily. Patient not taking: Reported on 03/17/2022 09/07/21 09/07/22  Barbette Merino, NP  risperiDONE (RISPERDAL) 2 MG tablet Take 1 tablet (2 mg total) by mouth at bedtime. Patient not taking: Reported on 01/12/2023 05/10/22   Ardis Hughs, NP  sodium chloride (OCEAN) 0.65 % SOLN nasal spray Place 1 spray into both nostrils as needed for congestion. Patient not taking: Reported on 02/11/2023 11/08/22   Donell Beers, FNP  Vitamin D, Ergocalciferol, (DRISDOL) 1.25 MG (50000 UNIT) CAPS capsule Take 1 capsule (50,000 Units total) by mouth every 7 (seven) days. 12/06/22   Ivonne Andrew, NP     Allergies:   Allergies  Allergen Reactions   Latex Hives and Rash   Penicillins Swelling and Rash    Has patient had a PCN reaction causing immediate rash, facial/tongue/throat swelling, SOB or lightheadedness with hypotension: Yes Has patient had a PCN reaction causing severe rash involving mucus membranes or skin necrosis: Yes Has patient had a PCN reaction that required hospitalization No Has patient had a PCN reaction occurring within the last 10 years: no If all of the above answers are "NO", then may proceed with Cephalosporin use.      Physical Exam:   Vitals:   Vitals:   04/13/23 0836 04/13/23 0949  BP:  (!) 134/97  Pulse:  85  Resp: 14 18  Temp:  97.6 F (36.4 C)  SpO2: 97% 100%    Physical Exam: Constitutional: Patient appears well-developed and well-nourished. Not in obvious distress. HENT: Normocephalic, atraumatic, External right and left ear normal. Oropharynx is clear and moist.  Eyes: Conjunctivae and EOM are normal. PERRLA, no scleral  icterus. Neck: Normal ROM. Neck supple. No JVD. No tracheal deviation. No thyromegaly. CVS: RRR, S1/S2 +, no murmurs, no gallops, no carotid bruit.  Pulmonary: Effort and breath sounds normal, no stridor, rhonchi, wheezes, rales.  Abdominal: Soft. BS +, no distension, tenderness, rebound or guarding.  Musculoskeletal: Normal range of motion. No edema and no tenderness.  Lymphadenopathy: No lymphadenopathy noted, cervical, inguinal or axillary Neuro: Alert. Normal reflexes, muscle tone coordination. No cranial nerve deficit. Skin: Skin is warm and dry. No rash noted. Not diaphoretic. No erythema. No pallor. Psychiatric: Normal mood and affect. Behavior, judgment, thought content normal.   Data Review:   CBC Recent Labs  Lab 04/12/23 1338 04/13/23 0609  WBC 12.4* 20.1*  HGB 12.7 11.8*  HCT 35.1* 32.7*  PLT  320 227  MCV 77.8* 78.2*  MCH 28.2 28.2  MCHC 36.2* 36.1*  RDW 14.7 15.1  LYMPHSABS 2.9  --   MONOABS 1.2*  --   EOSABS 0.5  --   BASOSABS 0.1  --    ------------------------------------------------------------------------------------------------------------------  Chemistries  Recent Labs  Lab 04/12/23 1338 04/13/23 0609  NA 137 132*  K 3.5 4.1  CL 104 96*  CO2 21* 22  GLUCOSE 111* 107*  BUN 5* 8  CREATININE 0.66 0.73  CALCIUM 8.4* 7.8*  AST 67*  --   ALT 48*  --   ALKPHOS 59  --   BILITOT 1.0  --    ------------------------------------------------------------------------------------------------------------------ estimated creatinine clearance is 89.3 mL/min (by C-G formula based on SCr of 0.73 mg/dL). ------------------------------------------------------------------------------------------------------------------ No results for input(s): "TSH", "T4TOTAL", "T3FREE", "THYROIDAB" in the last 72 hours.  Invalid input(s): "FREET3"  Coagulation profile No results for input(s): "INR", "PROTIME" in the last 168  hours. ------------------------------------------------------------------------------------------------------------------- No results for input(s): "DDIMER" in the last 72 hours. -------------------------------------------------------------------------------------------------------------------  Cardiac Enzymes No results for input(s): "CKMB", "TROPONINI", "MYOGLOBIN" in the last 168 hours.  Invalid input(s): "CK" ------------------------------------------------------------------------------------------------------------------    Component Value Date/Time   BNP 27.4 03/07/2020 1532    ---------------------------------------------------------------------------------------------------------------  Urinalysis    Component Value Date/Time   COLORURINE YELLOW 03/13/2023 2322   APPEARANCEUR HAZY (A) 03/13/2023 2322   LABSPEC 1.009 03/13/2023 2322   PHURINE 6.0 03/13/2023 2322   GLUCOSEU NEGATIVE 03/13/2023 2322   HGBUR LARGE (A) 03/13/2023 2322   BILIRUBINUR NEGATIVE 03/13/2023 2322   BILIRUBINUR negative 03/17/2022 1617   BILIRUBINUR neg 04/16/2021 1329   KETONESUR NEGATIVE 03/13/2023 2322   PROTEINUR NEGATIVE 03/13/2023 2322   UROBILINOGEN 1.0 03/17/2022 1617   UROBILINOGEN 1.0 09/30/2017 1513   NITRITE NEGATIVE 03/13/2023 2322   LEUKOCYTESUR NEGATIVE 03/13/2023 2322    ----------------------------------------------------------------------------------------------------------------   Imaging Results:    No results found.   Assessment & Plan:  Principal Problem:   Sickle cell pain crisis (HCC) Active Problems:   Anemia of chronic disease   Chronic pain syndrome   Sickle cell disease with pain crisis: Admit.  Initiate IV Dilaudid PCA, full dose. IV fluids, 0.45% saline at 100 mL/h Toradol 15 mg IV every 6 hours Monitor vital signs very closely, reevaluate pain scale regularly, and supplemental oxygen as needed  Chronic pain syndrome: Patient has not been able to  obtain home opiates due to her illicit drug use.  History of cocaine abuse: UDS pending.  Review as results become available  Anemia of chronic disease: Hemoglobin stable and consistent with patient's baseline.  There is no clinical indication for blood transfusion at this time.  Labs in AM.  DVT Prophylaxis: Subcut Lovenox   AM Labs Ordered, also please review Full Orders  Family Communication: Admission, patient's condition and plan of care including tests being ordered have been discussed with the patient who indicate understanding and agree with the plan and Code Status.  Code Status: Full Code  Consults called: None    Admission status: Inpatient    Time spent in minutes : 30 minutes   Nolon Nations  APRN, MSN, FNP-C Patient Care [facility] [address] [place], [place] [address] [phone]  04/15/2023 at 5:42 AM

## 2023-04-12 NOTE — ED Triage Notes (Signed)
Pt arrives by [facility] from home due to sickle cell pain.  Pt ran out of medication about 2months ago and has been having pain flare up with leg pain and joint swelling about every 2-3 weeks.  Pt was given 500cc NS and Fentanyl pta.  She wanted to go to sickle cell clinic yesterday but they were full.

## 2023-04-13 DIAGNOSIS — D5700 Hb-SS disease with crisis, unspecified: Secondary | ICD-10-CM | POA: Diagnosis not present

## 2023-04-13 LAB — CBC
HCT: 32.7 % — ABNORMAL LOW (ref 36.0–46.0)
Hemoglobin: 11.8 g/dL — ABNORMAL LOW (ref 12.0–15.0)
MCH: 28.2 pg (ref 26.0–34.0)
MCHC: 36.1 g/dL — ABNORMAL HIGH (ref 30.0–36.0)
MCV: 78.2 fL — ABNORMAL LOW (ref 80.0–100.0)
Platelets: 227 10*3/uL (ref 150–400)
RBC: 4.18 MIL/uL (ref 3.87–5.11)
RDW: 15.1 % (ref 11.5–15.5)
WBC: 20.1 10*3/uL — ABNORMAL HIGH (ref 4.0–10.5)
nRBC: 2.6 % — ABNORMAL HIGH (ref 0.0–0.2)

## 2023-04-13 LAB — BASIC METABOLIC PANEL
Anion gap: 14 (ref 5–15)
BUN: 8 mg/dL (ref 6–20)
CO2: 22 mmol/L (ref 22–32)
Calcium: 7.8 mg/dL — ABNORMAL LOW (ref 8.9–10.3)
Chloride: 96 mmol/L — ABNORMAL LOW (ref 98–111)
Creatinine, Ser: 0.73 mg/dL (ref 0.44–1.00)
GFR, Estimated: >60 mL/min (ref >60)
Glucose, Bld: 107 mg/dL — ABNORMAL HIGH (ref 70–99)
Potassium: 4.1 mmol/L (ref 3.5–5.1)
Sodium: 132 mmol/L — ABNORMAL LOW (ref 135–145)

## 2023-04-13 MED ORDER — ALUM & MAG HYDROXIDE-SIMETH 200-200-20 MG/5ML PO SUSP
30.0000 mL | ORAL | Status: DC | PRN
  Administered 2023-04-13 (×2): 30 mL via ORAL
  Filled 2023-04-13 (×2): qty 30

## 2023-04-13 MED ORDER — LORATADINE 10 MG PO TABS
10.0000 mg | ORAL_TABLET | Freq: Every day | ORAL | Status: DC | PRN
  Administered 2023-04-13: 10 mg via ORAL
  Filled 2023-04-13: qty 1

## 2023-04-13 MED ORDER — IBUPROFEN 600 MG PO TABS: 600.0000 mg | ORAL_TABLET | Freq: Three times a day (TID) | ORAL | 0 refills | Status: DC | PRN

## 2023-04-13 MED ORDER — LIP MEDEX EX OINT
1.0000 | TOPICAL_OINTMENT | CUTANEOUS | Status: DC | PRN
  Administered 2023-04-13: 1 via TOPICAL
  Filled 2023-04-13: qty 7

## 2023-04-13 MED ORDER — OXYCODONE-ACETAMINOPHEN 10-325 MG PO TABS: 1.0000 | ORAL_TABLET | Freq: Four times a day (QID) | ORAL | 0 refills | Status: DC | PRN

## 2023-04-13 MED ORDER — SALINE SPRAY 0.65 % NA SOLN
1.0000 | NASAL | Status: DC | PRN
  Administered 2023-04-13: 1 via NASAL
  Filled 2023-04-13: qty 44

## 2023-04-13 NOTE — Discharge Summary (Signed)
Physician Discharge Summary  Hannah Hawkins ZOX:[id number] DOB: 12-16-1986 DOA: 04/12/2023  PCP: Ivonne Andrew, NP  Admit date: 04/12/2023  Discharge date: 04/13/2023  Discharge Diagnoses:  Principal Problem:   Sickle cell pain crisis Active Problems:   Anemia of chronic disease   Chronic pain syndrome   Discharge Condition: Stable  Disposition:   Follow-up Information     Connect with your PCP/Specialist as discussed. Schedule an appointment as soon as possible for a visit .   Contact information: [email or web address] Call our physician referral line at [phone].        Donell Beers, FNP Follow up.   Specialty: Nurse Practitioner Contact information: [address] [place], [place] [address] [phone]                Pt is discharged home in good condition and is to follow up with Ivonne Andrew, NP this week to have labs evaluated. Hannah Hawkins is instructed to increase activity slowly and balance with rest for the next few days, and use prescribed medication to complete treatment of pain  Diet: Regular Wt Readings from Last 3 Encounters:  04/12/23 71.1 kg  03/13/23 65.3 kg  02/11/23 65.7 kg    History of present illness:  Hannah Hawkins  is a 37 y.o. female with a medical history significant for sickle cell disease, chronic pain syndrome, history of cocaine abuse, and anemia of chronic disease presents to the emergency with complaints of low back and lower extremity pain for 1 day. Patient attributes pain crisis to changes in weather. Also, Hannah Hawkins states that she has not had any pain medications at home. She has not been getting her home medications due to past cocaine use. She last had Ibuprofen this am without sustained relief. Patient currently rates pain at 10/10, constant, and sharp. Patient denies fever, chills, shortness of breath, or chest pain. No urinary symptoms, nausea, vomiting, or  diarrhea. No sick contacts, recent travel, or known exposure to COVID-19.   ER Course:  Vital signs show: BP (!) 154/88 (BP Location: Right Arm)   Pulse 90   Temp (!) 97.4 F (36.3 C) (Oral)   Resp 16   SpO2 94%  Complete blood count notable for WBCs 12.4, hemoglobin 12.7, and platelets 227,000.  CMP unremarkable.  Patient's pain persists despite IV fluids and IV Dilaudid.  Admitted in observation for further management sickle cell pain crisis   Hospital Course:  Sickle cell disease with pain crisis: Patient was admitted for sickle cell pain crisis and managed appropriately with IVF, IV Dilaudid via PCA and IV Toradol, as well as other adjunct therapies per sickle cell pain management protocols.  Pain intensity decreased throughout admission.  Patient states that pain is much better controlled today.  She does not have any home pain medications.  Percocet 10-325 mg #20 was sent to patient's pharmacy to complete treatment.  Also, recommend ibuprofen 600 mg every 8 hours for mild to moderate pain. Patient advised to follow-up with PCP within 1 week to repeat labs. Patient unable to provide UDS, collect at follow-up appointment. Patient is alert, oriented, and ambulating without assistance.  Patient was therefore discharged home today in a hemodynamically stable condition.   Hannah Hawkins will follow-up with PCP within 1 week of this discharge. Hannah Hawkins was counseled extensively about nonpharmacologic means of pain management, patient verbalized understanding and was appreciative of  the care received during this admission.   We discussed the need for good hydration, monitoring of  hydration status, avoidance of heat, cold, stress, and infection triggers. . Patient was reminded of the need to seek medical attention immediately if any symptom of bleeding, anemia, or infection occurs.  Discharge Exam: Vitals:   04/13/23 0836 04/13/23 0949  BP:  (!) 134/97  Pulse:  85  Resp: 14 18  Temp:  97.6 F  (36.4 C)  SpO2: 97% 100%   Vitals:   04/13/23 0101 04/13/23 0255 04/13/23 0836 04/13/23 0949  BP:  (!) 136/94  (!) 134/97  Pulse:  (!) 108  85  Resp:  18 14 18   Temp:  98.3 F (36.8 C)  97.6 F (36.4 C)  TempSrc:  Oral  Oral  SpO2: 99% 97% 97% 100%  Weight:      Height:        General appearance : Awake, alert, not in any distress. Speech Clear. Not toxic looking HEENT: Atraumatic and Normocephalic, pupils equally reactive to light and accomodation Neck: Supple, no JVD. No cervical lymphadenopathy.  Chest: Good air entry bilaterally, no added sounds  CVS: S1 S2 regular, no murmurs.  Abdomen: Bowel sounds present, Non tender and not distended with no gaurding, rigidity or rebound. Extremities: B/L Lower Ext shows no edema, both legs are warm to touch Neurology: Awake alert, and oriented X 3, CN II-XII intact, Non focal Skin: No Rash  Discharge Instructions  Discharge Instructions     Discharge patient   Complete by: As directed    Discharge disposition: 01-Home or Self Care   Discharge patient date: 04/13/2023      Allergies as of 04/13/2023       Reactions   Latex Hives, Rash   Penicillins Swelling, Rash   Has patient had a PCN reaction causing immediate rash, facial/tongue/throat swelling, SOB or lightheadedness with hypotension: Yes Has patient had a PCN reaction causing severe rash involving mucus membranes or skin necrosis: Yes Has patient had a PCN reaction that required hospitalization No Has patient had a PCN reaction occurring within the last 10 years: no If all of the above answers are "NO", then may proceed with Cephalosporin use.        Medication List     STOP taking these medications    oxyCODONE 15 MG immediate release tablet Commonly known as: ROXICODONE       TAKE these medications    azelastine 0.1 % nasal spray Commonly known as: ASTELIN Place 2 sprays into both nostrils 2 (two) times daily. Use in each nostril as directed    fluticasone 50 MCG/ACT nasal spray Commonly known as: FLONASE Place 2 sprays into both nostrils daily.   ibuprofen 600 MG tablet Commonly known as: ADVIL Take 1 tablet (600 mg total) by mouth every 8 (eight) hours as needed. What changed: Another medication with the same name was removed. Continue taking this medication, and follow the directions you see here.   lidocaine 4 % Commonly known as: HM Lidocaine Patch Place 1 patch onto the skin daily.   montelukast 10 MG tablet Commonly known as: SINGULAIR Take 1 tablet (10 mg total) by mouth at bedtime.   norethindrone 0.35 MG tablet Commonly known as: MICRONOR Take 1 tablet (0.35 mg total) by mouth daily.   ondansetron 8 MG tablet Commonly known as: ZOFRAN Take 1 tablet (8 mg total) by mouth every 8 (eight) hours as needed for nausea or vomiting.   oxyCODONE-acetaminophen 10-325 MG tablet Commonly known as: Percocet Take 1 tablet by mouth every 6 (six) hours as needed for  pain.   pantoprazole 40 MG tablet Commonly known as: PROTONIX Take 1 tablet (40 mg total) by mouth daily.   risperiDONE 2 MG tablet Commonly known as: RISPERDAL Take 1 tablet (2 mg total) by mouth at bedtime.   sodium chloride 0.65 % Soln nasal spray Commonly known as: OCEAN Place 1 spray into both nostrils as needed for congestion.   TYLENOL 500 MG tablet Generic drug: acetaminophen Take 500-1,000 mg by mouth every 6 (six) hours as needed (for headaches).   Vitamin D (Ergocalciferol) 1.25 MG (50000 UNIT) Caps capsule Commonly known as: DRISDOL Take 1 capsule (50,000 Units total) by mouth every 7 (seven) days.        The results of significant diagnostics from this hospitalization (including imaging, microbiology, ancillary and laboratory) are listed below for reference.    Significant Diagnostic Studies: No results found.  Microbiology: No results found for this or any previous visit (from the past 240 hour(s)).   Labs: Basic Metabolic  Panel: Recent Labs  Lab 04/12/23 1338 04/13/23 0609  NA 137 132*  K 3.5 4.1  CL 104 96*  CO2 21* 22  GLUCOSE 111* 107*  BUN 5* 8  CREATININE 0.66 0.73  CALCIUM 8.4* 7.8*   Liver Function Tests: Recent Labs  Lab 04/12/23 1338  AST 67*  ALT 48*  ALKPHOS 59  BILITOT 1.0  PROT 8.0  ALBUMIN 4.2   No results for input(s): "LIPASE", "AMYLASE" in the last 168 hours. No results for input(s): "AMMONIA" in the last 168 hours. CBC: Recent Labs  Lab 04/12/23 1338 04/13/23 0609  WBC 12.4* 20.1*  NEUTROABS 7.7  --   HGB 12.7 11.8*  HCT 35.1* 32.7*  MCV 77.8* 78.2*  PLT 320 227   Cardiac Enzymes: No results for input(s): "CKTOTAL", "CKMB", "CKMBINDEX", "TROPONINI" in the last 168 hours. BNP: Invalid input(s): "POCBNP" CBG: No results for input(s): "GLUCAP" in the last 168 hours.  Time coordinating discharge: 30 minutes  Signed:  Nolon Nations  APRN, MSN, FNP-C Patient Care [facility] [address] [place], [place] [address] [phone]  [facility] 04/13/2023, 10:56 AM

## 2023-04-13 NOTE — Progress Notes (Signed)
Patient has been unable to keep her capnography tubing in her nose this morning as per policy, despite continued education. Patient also falling asleep mid conversation. Discussed with department director. Her PCA has been turned off and she is aware. Says she is going to eat her breakfast and I will check back to try again. Message sent to nurse practitioner.

## 2023-04-13 NOTE — Progress Notes (Signed)
Discharge orders placed by provider. Peripheral IV catheter discontinued and intact. Patient received bus pass as she states she does not have a ride. All patient belongings packed up in belongings bag and all items returned to patient. Patient wheeled down to discharge lobby to take public transportation home.

## 2023-04-14 ENCOUNTER — Telehealth: Payer: Self-pay

## 2023-04-14 NOTE — Transitions of Care (Post Inpatient/ED Visit) (Signed)
   04/14/2023  Name: Hannah Hawkins MRN: [id number] DOB: [birth year]  Today's TOC FU Call Status: Today's TOC FU Call Status:: Unsuccessul Call (1st Attempt) Unsuccessful Call (1st Attempt) Date: 04/14/23  Attempted to reach the patient regarding the most recent Inpatient/ED visit.  Follow Up Plan: Additional outreach attempts will be made to reach the patient to complete the Transitions of Care (Post Inpatient/ED visit) call.   Signature Karena Addison, LPN [employer] Nurse Health Advisor Direct Dial [phone]

## 2023-04-15 ENCOUNTER — Ambulatory Visit (INDEPENDENT_AMBULATORY_CARE_PROVIDER_SITE_OTHER): Payer: Medicaid Other | Admitting: Nurse Practitioner

## 2023-04-15 ENCOUNTER — Encounter: Payer: Self-pay | Admitting: Nurse Practitioner

## 2023-04-15 VITALS — BP 127/79 | HR 70 | Temp 97.3°F | Ht 63.0 in | Wt 156.6 lb

## 2023-04-15 DIAGNOSIS — D5700 Hb-SS disease with crisis, unspecified: Secondary | ICD-10-CM | POA: Diagnosis not present

## 2023-04-15 DIAGNOSIS — R1013 Epigastric pain: Secondary | ICD-10-CM

## 2023-04-15 MED ORDER — OMEPRAZOLE 20 MG PO CPDR
20.0000 mg | DELAYED_RELEASE_CAPSULE | Freq: Every day | ORAL | 3 refills | Status: DC
Start: 2023-04-15 — End: 2023-06-21

## 2023-04-15 NOTE — Patient Instructions (Signed)
1. Sickle cell pain crisis (HCC)  - Sickle Cell Panel - Drug Screen 11 w/Conf, Ser  2. Epigastric pain  - omeprazole (PRILOSEC) 20 MG capsule; Take 1 capsule (20 mg total) by mouth daily.  Dispense: 30 capsule; Refill: 3 - Ambulatory referral to Gastroenterology  Follow up:  Follow up in 3 months with [facility]

## 2023-04-15 NOTE — Assessment & Plan Note (Signed)
-   Sickle Cell Panel - Drug Screen 11 w/Conf, Ser  2. Epigastric pain  - omeprazole (PRILOSEC) 20 MG capsule; Take 1 capsule (20 mg total) by mouth daily.  Dispense: 30 capsule; Refill: 3 - Ambulatory referral to Gastroenterology  Follow up:  Follow up in 3 months with [facility]

## 2023-04-15 NOTE — Progress Notes (Signed)
@Patient  ID: Hannah Hawkins, female    DOB: March 28, 1986, 37 y.o.   MRN: [id number]  Chief Complaint  Patient presents with   Sickle Cell Anemia    Follow up    Referring provider: Ivonne Andrew, NP   HPI  Patient presents today for hospital follow-up.  She was admitted to the hospital on 04/12/2023 and discharged on 04/13/2023 for sickle cell pain crisis.  She has been told by this office that she will no longer yet controlled pain medications due to use of cocaine.  She was sent home with a prescription of Percocet from the hospital.  We will check follow-up labs including drug screen in office today. Denies f/c/s, n/v/d, hemoptysis, PND, leg swelling Denies chest pain or edema       Allergies  Allergen Reactions   Latex Hives and Rash   Penicillins Swelling and Rash    Has patient had a PCN reaction causing immediate rash, facial/tongue/throat swelling, SOB or lightheadedness with hypotension: Yes Has patient had a PCN reaction causing severe rash involving mucus membranes or skin necrosis: Yes Has patient had a PCN reaction that required hospitalization No Has patient had a PCN reaction occurring within the last 10 years: no If all of the above answers are "NO", then may proceed with Cephalosporin use.     Immunization History  Administered Date(s) Administered   Pneumococcal Polysaccharide-23 02/18/2017   Tdap 02/18/2017    Past Medical History:  Diagnosis Date   Sickle cell anemia (HCC)     Tobacco History: Social History   Tobacco Use  Smoking Status Former   Types: Cigarettes  Smokeless Tobacco Never   Counseling given: Not Answered   Outpatient Encounter Medications as of 04/15/2023  Medication Sig   ibuprofen (ADVIL) 600 MG tablet Take 1 tablet (600 mg total) by mouth every 8 (eight) hours as needed.   omeprazole (PRILOSEC) 20 MG capsule Take 1 capsule (20 mg total) by mouth daily.   oxyCODONE-acetaminophen (PERCOCET) 10-325 MG tablet Take 1  tablet by mouth every 6 (six) hours as needed for pain.   TYLENOL 500 MG tablet Take 500-1,000 mg by mouth every 6 (six) hours as needed (for headaches).   azelastine (ASTELIN) 0.1 % nasal spray Place 2 sprays into both nostrils 2 (two) times daily. Use in each nostril as directed (Patient not taking: Reported on 04/12/2023)   fluticasone (FLONASE) 50 MCG/ACT nasal spray Place 2 sprays into both nostrils daily. (Patient not taking: Reported on 04/12/2023)   lidocaine (HM LIDOCAINE PATCH) 4 % Place 1 patch onto the skin daily. (Patient not taking: Reported on 04/12/2023)   montelukast (SINGULAIR) 10 MG tablet Take 1 tablet (10 mg total) by mouth at bedtime. (Patient not taking: Reported on 04/12/2023)   norethindrone (MICRONOR) 0.35 MG tablet Take 1 tablet (0.35 mg total) by mouth daily. (Patient not taking: Reported on 04/12/2023)   ondansetron (ZOFRAN) 8 MG tablet Take 1 tablet (8 mg total) by mouth every 8 (eight) hours as needed for nausea or vomiting. (Patient not taking: Reported on 04/12/2023)   pantoprazole (PROTONIX) 40 MG tablet Take 1 tablet (40 mg total) by mouth daily. (Patient not taking: Reported on 04/12/2023)   risperiDONE (RISPERDAL) 2 MG tablet Take 1 tablet (2 mg total) by mouth at bedtime. (Patient not taking: Reported on 04/12/2023)   sodium chloride (OCEAN) 0.65 % SOLN nasal spray Place 1 spray into both nostrils as needed for congestion. (Patient not taking: Reported on 04/12/2023)   Vitamin D,  Ergocalciferol, (DRISDOL) 1.25 MG (50000 UNIT) CAPS capsule Take 1 capsule (50,000 Units total) by mouth every 7 (seven) days. (Patient not taking: Reported on 04/12/2023)   No facility-administered encounter medications on file as of 04/15/2023.     Review of Systems  Review of Systems  Constitutional: Negative.   HENT: Negative.    Cardiovascular: Negative.   Gastrointestinal: Negative.   Allergic/Immunologic: Negative.   Neurological: Negative.   Psychiatric/Behavioral: Negative.          Physical Exam  BP 127/79   Pulse 70   Temp (!) 97.3 F (36.3 C)   Ht 5\' 3"  (1.6 m)   Wt 156 lb 9.6 oz (71 kg)   SpO2 100%   BMI 27.74 kg/m   Wt Readings from Last 5 Encounters:  04/15/23 156 lb 9.6 oz (71 kg)  04/12/23 156 lb 12 oz (71.1 kg)  03/13/23 144 lb (65.3 kg)  02/11/23 144 lb 12.8 oz (65.7 kg)  02/08/23 132 lb 4.4 oz (60 kg)     Physical Exam Vitals and nursing note reviewed.  Constitutional:      General: She is not in acute distress.    Appearance: She is well-developed.  Cardiovascular:     Rate and Rhythm: Normal rate and regular rhythm.  Pulmonary:     Effort: Pulmonary effort is normal.     Breath sounds: Normal breath sounds.  Neurological:     Mental Status: She is alert and oriented to person, place, and time.      Lab Results:  CBC    Component Value Date/Time   WBC 20.1 (H) 04/13/2023 0609   RBC 4.18 04/13/2023 0609   HGB 11.8 (L) 04/13/2023 0609   HGB 10.6 (L) 12/02/2022 1057   HCT 32.7 (L) 04/13/2023 0609   HCT 31.5 (L) 12/02/2022 1057   PLT 227 04/13/2023 0609   PLT 425 12/02/2022 1057   MCV 78.2 (L) 04/13/2023 0609   MCV 82 12/02/2022 1057   MCH 28.2 04/13/2023 0609   MCHC 36.1 (H) 04/13/2023 0609   RDW 15.1 04/13/2023 0609   RDW 15.5 (H) 12/02/2022 1057   LYMPHSABS 2.9 04/12/2023 1338   LYMPHSABS 3.7 (H) 12/02/2022 1057   MONOABS 1.2 (H) 04/12/2023 1338   EOSABS 0.5 04/12/2023 1338   EOSABS 0.4 12/02/2022 1057   BASOSABS 0.1 04/12/2023 1338   BASOSABS 0.1 12/02/2022 1057    BMET    Component Value Date/Time   NA 132 (L) 04/13/2023 0609   NA 137 12/02/2022 1057   K 4.1 04/13/2023 0609   CL 96 (L) 04/13/2023 0609   CO2 22 04/13/2023 0609   GLUCOSE 107 (H) 04/13/2023 0609   BUN 8 04/13/2023 0609   BUN 10 12/02/2022 1057   CREATININE 0.73 04/13/2023 0609   CREATININE 0.69 08/04/2017 1208   CALCIUM 7.8 (L) 04/13/2023 0609   GFRNONAA >60 04/13/2023 0609   GFRNONAA >89 08/04/2017 1208   GFRAA >60 09/23/2020  0658   GFRAA >89 08/04/2017 1208    BNP    Component Value Date/Time   BNP 27.4 03/07/2020 1532    ProBNP No results found for: "PROBNP"  Imaging: No results found.   Assessment & Plan:   Sickle cell pain crisis (HCC) - Sickle Cell Panel - Drug Screen 11 w/Conf, Ser  2. Epigastric pain  - omeprazole (PRILOSEC) 20 MG capsule; Take 1 capsule (20 mg total) by mouth daily.  Dispense: 30 capsule; Refill: 3 - Ambulatory referral to Gastroenterology  Follow up:  Follow up in 3 months with Vonzell Schlatter, NP 04/15/2023

## 2023-04-18 NOTE — Transitions of Care (Post Inpatient/ED Visit) (Signed)
   04/18/2023  Name: Hannah Hawkins MRN: [id number] DOB: [birth year] Patient already seen in office Today's TOC FU Call Status: Today's TOC FU Call Status:: Unsuccessul Call (1st Attempt) Unsuccessful Call (1st Attempt) Date: 04/14/23  Attempted to reach the patient regarding the most recent Inpatient/ED visit.  Follow Up Plan: No further outreach attempts will be made at this time. We have been unable to contact the patient.  Signature Karena Addison, LPN [facility] Nurse Health Advisor Direct Dial [phone]

## 2023-04-20 ENCOUNTER — Telehealth: Payer: Self-pay | Admitting: Clinical

## 2023-04-20 NOTE — Telephone Encounter (Signed)
Re-refer to [facility] [phone] best number - change in chart Change email to [email or web address] Address ok for now but lease ending Pt filled out app for [place]  Send hosuing/shelter resources Send [place] housing auth contact Send my direct contact

## 2023-04-20 NOTE — Telephone Encounter (Signed)
Caller & Relationship to patient:  MRN #  [id number]   Call Back Number:   Date of Last Office Visit: 04/15/2023     Date of Next Office Visit: 07/15/2023    Medication(s) to be Refilled: oxycodone 15 mg   Preferred Pharmacy:   ** Please notify patient to allow 48-72 hours to process** **Let patient know to contact pharmacy at the end of the day to make sure medication is ready. ** **If patient has not been seen in a year or longer, book an appointment **Advise to use MyChart for refill requests OR to contact their pharmacy

## 2023-04-21 ENCOUNTER — Telehealth: Payer: Self-pay | Admitting: Nurse Practitioner

## 2023-04-21 NOTE — Telephone Encounter (Signed)
Caller & Relationship to patient:  MRN #  [id number]   Call Back Number:   Date of Last Office Visit: 04/20/2023     Date of Next Office Visit: 07/15/2023    Medication(s) to be Refilled: Oxycodone 15 mg  Preferred Pharmacy:   ** Please notify patient to allow 48-72 hours to process** **Let patient know to contact pharmacy at the end of the day to make sure medication is ready. ** **If patient has not been seen in a year or longer, book an appointment **Advise to use MyChart for refill requests OR to contact their pharmacy

## 2023-04-22 ENCOUNTER — Telehealth: Payer: Self-pay | Admitting: Clinical

## 2023-04-22 NOTE — Telephone Encounter (Signed)
Integrated Behavioral Health Progress Note  04/22/2023 Name: Hannah Hawkins MRN: [id number] DOB: 05-07-1986 Hannah Hawkins is a 37 y.o. year old female who sees Ivonne Andrew, NP for primary care. LCSW was initially consulted to assess patient's needs and assist with community resources.  Interpreter: No.   Interpreter Name & Language: none  Assessment: Patient experiencing housing instability and financial barriers.  Ongoing Intervention: Called patient to follow up on community resource needs. Reviewed housing and shelter resources with patient. Also completed new [facility] referral and submitted. CSW had referred patient to [facility] for assistance with disability claim last year. [facility] did not receive necessary paperwork back from patient at that time.   Patient also requested help in getting her pain medication refilled. She is not scheduled to see provider until July. Sent this request to Julianne Handler, NP.  Abigail Butts, LCSW Patient Care [facility] [phone]

## 2023-05-06 ENCOUNTER — Ambulatory Visit (INDEPENDENT_AMBULATORY_CARE_PROVIDER_SITE_OTHER): Payer: Medicaid Other | Admitting: Nurse Practitioner

## 2023-05-06 ENCOUNTER — Encounter: Payer: Self-pay | Admitting: Nurse Practitioner

## 2023-05-06 VITALS — BP 114/75 | HR 75 | Temp 97.9°F | Ht 63.0 in | Wt 156.6 lb

## 2023-05-06 DIAGNOSIS — D5720 Sickle-cell/Hb-C disease without crisis: Secondary | ICD-10-CM | POA: Diagnosis not present

## 2023-05-06 MED ORDER — IBUPROFEN 600 MG PO TABS: 600.0000 mg | ORAL_TABLET | Freq: Three times a day (TID) | ORAL | 0 refills | Status: DC | PRN

## 2023-05-06 MED ORDER — LIDOCAINE 4 % EX PTCH: 1.0000 | MEDICATED_PATCH | CUTANEOUS | 0 refills | Status: DC

## 2023-05-06 NOTE — Assessment & Plan Note (Addendum)
1. Sickle cell-hemoglobin C disease without crisis (HCC)  - ibuprofen (ADVIL) 600 MG tablet; Take 1 tablet (600 mg total) by mouth every 8 (eight) hours as needed.  Dispense: 30 tablet; Refill: 0 - lidocaine (HM LIDOCAINE PATCH) 4 %; Place 1 patch onto the skin daily.  Dispense: 15 patch; Refill: 0 - Sickle Cell Panel - Drug Screen 11 w/Conf, Ser - [id number] 11+Oxyco+Alc+Crt-Bund   I have discussed her situation with [place] NP.  We will treat her pain with non opioids like Cymbalta, gabapentin if patient agrees to plan, could also do Suboxone if she is willing.  For now we will continue ibuprofen and lidocaine patches Need to maintain hydration and avoid triggers of infection discussed.

## 2023-05-06 NOTE — Progress Notes (Signed)
Established Patient Office Visit  Subjective:  Patient ID: Hannah Hawkins, female    DOB: 12-20-1986  Age: 38 y.o. MRN: [id number]  CC:  Chief Complaint  Patient presents with   Follow-up    Medication.    HPI Hannah Hawkins is a 37 y.o. female  has a past medical history of Sickle cell anemia (HCC).  Cocaine use, GERD .patient presents to discuss need for opioids for her sickle cell disease .  Patient saw her PCP about a month ago, PCP not willing to prescribe opioid due to her history of cocaine use, she has been referred to pain management in the past but the referral was denied.  She stated that her last cocaine use was months ago, she declined referral for drug rehab.  She is not interested in taking hydroxyurea.  Has sickle cell pain is seen both ankles and knees.  She has been taking ibuprofen which is not helping.  Currently has pain rated 5/10.  Patient denies fever, chills, chest pain, shortness of breath ,nausea, vomiting, hematemesis     Past Medical History:  Diagnosis Date   Sickle cell anemia (HCC)     Past Surgical History:  Procedure Laterality Date   CHOLECYSTECTOMY N/A 06/03/2021   Procedure: LAPAROSCOPIC CHOLECYSTECTOMY;  Surgeon: Abigail Miyamoto, MD;  Location: [facility];  Service: General;  Laterality: N/A;   EYE SURGERY  03/09/2017   left eye   EYE SURGERY Right 09/2019   right [place]     Family History  Problem Relation Age of Onset   Diabetes Father     Social History   Socioeconomic History   Marital status: Single    Spouse name: Not on file   Number of children: Not on file   Years of education: Not on file   Highest education level: Not on file  Occupational History   Not on file  Tobacco Use   Smoking status: Former    Types: Cigarettes   Smokeless tobacco: Never  Vaping Use   Vaping Use: Every day   Substances: Flavoring  Substance and Sexual Activity   Alcohol use: Not Currently   Drug use: Not Currently    Types:  Marijuana   Sexual activity: Yes  Other Topics Concern   Not on file  Social History Narrative   Not on file   Social Determinants of Health   Financial Resource Strain: Not on file  Food Insecurity: No Food Insecurity (04/12/2023)   Hunger Vital Sign    Worried About Running Out of Food in the Last Year: Never true    Ran Out of Food in the Last Year: Never true  Transportation Needs: Unmet Transportation Needs (04/12/2023)   PRAPARE - Administrator, Civil Service (Medical): Yes    Lack of Transportation (Non-Medical): Yes  Physical Activity: Not on file  Stress: Not on file  Social Connections: Not on file  Intimate Partner Violence: Not At Risk (04/12/2023)   Humiliation, Afraid, Rape, and Kick questionnaire    Fear of Current or Ex-Partner: No    Emotionally Abused: No    Physically Abused: No    Sexually Abused: No    Outpatient Medications Prior to Visit  Medication Sig Dispense Refill   fluticasone (FLONASE) 50 MCG/ACT nasal spray Place 2 sprays into both nostrils daily. 16 g 2   ondansetron (ZOFRAN) 8 MG tablet Take 1 tablet (8 mg total) by mouth every 8 (eight) hours as needed for nausea or vomiting. 20  tablet 0   Vitamin D, Ergocalciferol, (DRISDOL) 1.25 MG (50000 UNIT) CAPS capsule Take 1 capsule (50,000 Units total) by mouth every 7 (seven) days. 5 capsule 2   ibuprofen (ADVIL) 600 MG tablet Take 1 tablet (600 mg total) by mouth every 8 (eight) hours as needed. 30 tablet 0   lidocaine (HM LIDOCAINE PATCH) 4 % Place 1 patch onto the skin daily. 15 patch 0   azelastine (ASTELIN) 0.1 % nasal spray Place 2 sprays into both nostrils 2 (two) times daily. Use in each nostril as directed (Patient not taking: Reported on 04/12/2023) 30 mL 3   montelukast (SINGULAIR) 10 MG tablet Take 1 tablet (10 mg total) by mouth at bedtime. (Patient not taking: Reported on 04/12/2023) 30 tablet 3   norethindrone (MICRONOR) 0.35 MG tablet Take 1 tablet (0.35 mg total) by mouth daily.  (Patient not taking: Reported on 04/12/2023) 28 tablet 11   omeprazole (PRILOSEC) 20 MG capsule Take 1 capsule (20 mg total) by mouth daily. (Patient not taking: Reported on 05/06/2023) 30 capsule 3   oxyCODONE-acetaminophen (PERCOCET) 10-325 MG tablet Take 1 tablet by mouth every 6 (six) hours as needed for pain. (Patient not taking: Reported on 05/06/2023) 20 tablet 0   pantoprazole (PROTONIX) 40 MG tablet Take 1 tablet (40 mg total) by mouth daily. (Patient not taking: Reported on 04/12/2023) 30 tablet 11   risperiDONE (RISPERDAL) 2 MG tablet Take 1 tablet (2 mg total) by mouth at bedtime. (Patient not taking: Reported on 04/12/2023) 14 tablet 0   sodium chloride (OCEAN) 0.65 % SOLN nasal spray Place 1 spray into both nostrils as needed for congestion. (Patient not taking: Reported on 04/12/2023) 30 mL 0   TYLENOL 500 MG tablet Take 500-1,000 mg by mouth every 6 (six) hours as needed (for headaches). (Patient not taking: Reported on 05/06/2023)     No facility-administered medications prior to visit.    Allergies  Allergen Reactions   Latex Hives and Rash   Penicillins Swelling and Rash    Has patient had a PCN reaction causing immediate rash, facial/tongue/throat swelling, SOB or lightheadedness with hypotension: Yes Has patient had a PCN reaction causing severe rash involving mucus membranes or skin necrosis: Yes Has patient had a PCN reaction that required hospitalization No Has patient had a PCN reaction occurring within the last 10 years: no If all of the above answers are "NO", then may proceed with Cephalosporin use.     ROS Review of Systems  Constitutional:  Negative for activity change, appetite change, chills, fatigue and fever.  HENT: Negative.  Negative for rhinorrhea, sinus pressure, sinus pain, sneezing and sore throat.   Eyes:  Negative for pain, discharge, redness and itching.  Respiratory:  Negative for cough, chest tightness, shortness of breath and wheezing.    Cardiovascular:  Negative for chest pain, palpitations and leg swelling.  Gastrointestinal:  Negative for abdominal distention, abdominal pain, anal bleeding, blood in stool, constipation, diarrhea, nausea, rectal pain and vomiting.  Endocrine: Negative for cold intolerance, heat intolerance, polydipsia, polyphagia and polyuria.  Genitourinary:  Negative for difficulty urinating, dysuria, flank pain, frequency, hematuria, menstrual problem, pelvic pain and vaginal bleeding.  Musculoskeletal:  Positive for arthralgias. Negative for back pain, gait problem, joint swelling and myalgias.  Skin:  Negative for color change, pallor, rash and wound.  Allergic/Immunologic: Negative for environmental allergies, food allergies and immunocompromised state.  Neurological:  Negative for dizziness, tremors, facial asymmetry, weakness and headaches.  Hematological:  Negative for adenopathy. Does not  bruise/bleed easily.  Psychiatric/Behavioral:  Negative for agitation, behavioral problems, confusion, decreased concentration, hallucinations, self-injury and suicidal ideas.       Objective:    Physical Exam Vitals and nursing note reviewed.  Constitutional:      General: She is not in acute distress.    Appearance: Normal appearance. She is obese. She is not ill-appearing, toxic-appearing or diaphoretic.  HENT:     Mouth/Throat:     Mouth: Mucous membranes are moist.     Pharynx: Oropharynx is clear. No oropharyngeal exudate or posterior oropharyngeal erythema.  Eyes:     General: Scleral icterus present.        Right eye: No discharge.        Left eye: No discharge.     Extraocular Movements: Extraocular movements intact.     Conjunctiva/sclera: Conjunctivae normal.  Cardiovascular:     Rate and Rhythm: Normal rate and regular rhythm.     Pulses: Normal pulses.     Heart sounds: Normal heart sounds. No murmur heard.    No friction rub. No gallop.  Pulmonary:     Effort: Pulmonary effort is  normal. No respiratory distress.     Breath sounds: Normal breath sounds. No stridor. No wheezing, rhonchi or rales.  Chest:     Chest wall: No tenderness.  Abdominal:     General: There is no distension.     Palpations: Abdomen is soft.     Tenderness: There is no abdominal tenderness. There is no right CVA tenderness, left CVA tenderness or guarding.  Musculoskeletal:        General: No swelling, tenderness, deformity or signs of injury.     Right lower leg: No edema.     Left lower leg: No edema.  Skin:    General: Skin is warm and dry.     Capillary Refill: Capillary refill takes 2 to 3 seconds.     Coloration: Skin is not jaundiced or pale.     Findings: No bruising, erythema or lesion.  Neurological:     Mental Status: She is alert and oriented to person, place, and time.     Motor: No weakness.     Coordination: Coordination normal.     Gait: Gait normal.  Psychiatric:        Mood and Affect: Mood normal.        Behavior: Behavior normal.        Thought Content: Thought content normal.        Judgment: Judgment normal.     BP 114/75   Pulse 75   Temp 97.9 F (36.6 C)   Ht 5\' 3"  (1.6 m)   Wt 156 lb 9.6 oz (71 kg)   LMP 04/15/2023 (Approximate)   SpO2 99%   BMI 27.74 kg/m  Wt Readings from Last 3 Encounters:  05/06/23 156 lb 9.6 oz (71 kg)  04/15/23 156 lb 9.6 oz (71 kg)  04/12/23 156 lb 12 oz (71.1 kg)    Lab Results  Component Value Date   TSH 1.940 07/24/2020   Lab Results  Component Value Date   WBC 20.1 (H) 04/13/2023   HGB 11.8 (L) 04/13/2023   HCT 32.7 (L) 04/13/2023   MCV 78.2 (L) 04/13/2023   PLT 227 04/13/2023   Lab Results  Component Value Date   NA 132 (L) 04/13/2023   K 4.1 04/13/2023   CO2 22 04/13/2023   GLUCOSE 107 (H) 04/13/2023   BUN 8 04/13/2023   CREATININE 0.73  04/13/2023   BILITOT 1.0 04/12/2023   ALKPHOS 59 04/12/2023   AST 67 (H) 04/12/2023   ALT 48 (H) 04/12/2023   PROT 8.0 04/12/2023   ALBUMIN 4.2 04/12/2023    CALCIUM 7.8 (L) 04/13/2023   ANIONGAP 14 04/13/2023   EGFR 114 12/02/2022   Lab Results  Component Value Date   CHOL 113 08/07/2020   Lab Results  Component Value Date   HDL 38 (L) 08/07/2020   Lab Results  Component Value Date   LDLCALC 65 08/07/2020   Lab Results  Component Value Date   TRIG 52 08/07/2020   Lab Results  Component Value Date   CHOLHDL 3.0 08/07/2020   No results found for: "HGBA1C"    Assessment & Plan:   Problem List Items Addressed This Visit       Other   Sickle-cell/Hb-C disease (HCC) - Primary (Chronic)    1. Sickle cell-hemoglobin C disease without crisis (HCC)  - ibuprofen (ADVIL) 600 MG tablet; Take 1 tablet (600 mg total) by mouth every 8 (eight) hours as needed.  Dispense: 30 tablet; Refill: 0 - lidocaine (HM LIDOCAINE PATCH) 4 %; Place 1 patch onto the skin daily.  Dispense: 15 patch; Refill: 0 - Sickle Cell Panel - Drug Screen 11 w/Conf, Ser - [id number] 11+Oxyco+Alc+Crt-Bund   I have discussed her situation with [place] NP.  We will treat her pain with non opioids like  Cymbalta, gabapentin if patient agrees to plan, could also do Suboxone if she is willing.  For now we will continue ibuprofen and lidocaine patches Need to maintain hydration and avoid triggers of infection discussed.      Relevant Medications   ibuprofen (ADVIL) 600 MG tablet   lidocaine (HM LIDOCAINE PATCH) 4 %   Other Relevant Orders   Sickle Cell Panel   Drug Screen 11 w/Conf, Ser   [id number] 11+Oxyco+Alc+Crt-Bund    Meds ordered this encounter  Medications   ibuprofen (ADVIL) 600 MG tablet    Sig: Take 1 tablet (600 mg total) by mouth every 8 (eight) hours as needed.    Dispense:  30 tablet    Refill:  0   lidocaine (HM LIDOCAINE PATCH) 4 %    Sig: Place 1 patch onto the skin daily.    Dispense:  15 patch    Refill:  0    Follow-up: Return in about 3 months (around 08/06/2023).    Donell Beers, FNP

## 2023-05-06 NOTE — Patient Instructions (Signed)
1. Sickle cell-hemoglobin C disease without crisis (HCC)  - ibuprofen (ADVIL) 600 MG tablet; Take 1 tablet (600 mg total) by mouth every 8 (eight) hours as needed.  Dispense: 30 tablet; Refill: 0 - lidocaine (HM LIDOCAINE PATCH) 4 %; Place 1 patch onto the skin daily.  Dispense: 15 patch; Refill: 0  2. Chronic pain syndrome  - lidocaine (HM LIDOCAINE PATCH) 4 %; Place 1 patch onto the skin daily.  Dispense: 15 patch; Refill: 0      It is important that you exercise regularly at least 30 minutes 5 times a week as tolerated  Think about what you will eat, plan ahead. Choose " clean, green, fresh or frozen" over canned, processed or packaged foods which are more sugary, salty and fatty. 70 to 75% of food eaten should be vegetables and fruit. Three meals at set times with snacks allowed between meals, but they must be fruit or vegetables. Aim to eat over a 12 hour period , example 7 am to 7 pm, and STOP after  your last meal of the day. Drink water,generally about 64 ounces per day, no other drink is as healthy. Fruit juice is best enjoyed in a healthy way, by EATING the fruit.  Thanks for choosing [facility] we consider it a privelige to serve you.

## 2023-05-07 LAB — CMP14+CBC/D/PLT+FER+RETIC+V...
Albumin: 4.1 g/dL (ref 3.9–4.9)
Alkaline Phosphatase: 76 IU/L (ref 44–121)
Bilirubin Total: 0.4 mg/dL (ref 0.0–1.2)
Lymphocytes Absolute: 4.6 10*3/uL — ABNORMAL HIGH (ref 0.7–3.1)

## 2023-05-07 LAB — DRUG SCREEN 764883 11+OXYCO+ALC+CRT-BUND

## 2023-05-09 ENCOUNTER — Telehealth: Payer: Self-pay

## 2023-05-09 NOTE — Telephone Encounter (Signed)
Unable to leave a message. Gh 

## 2023-05-10 LAB — CMP14+CBC/D/PLT+FER+RETIC+V...
BUN/Creatinine Ratio: 14 (ref 9–23)
Basos: 1 %
Glucose: 76 mg/dL (ref 70–99)
Hemoglobin: 12.1 g/dL (ref 11.1–15.9)
MCHC: 33.6 g/dL (ref 31.5–35.7)
NRBC: 6 % — ABNORMAL HIGH (ref 0–0)
Platelets: 356 10*3/uL (ref 150–450)
Potassium: 4 mmol/L (ref 3.5–5.2)
RDW: 16.3 % — ABNORMAL HIGH (ref 11.7–15.4)
Sodium: 141 mmol/L (ref 134–144)
eGFR: 80 mL/min/{1.73_m2} (ref >59)

## 2023-05-10 LAB — DRUG SCREEN 764883 11+OXYCO+ALC+CRT-BUND
Amphetamines, Urine: NEGATIVE ng/mL
BENZODIAZ UR QL: NEGATIVE ng/mL
Cocaine (Metabolite): NEGATIVE ng/mL
Creatinine: 149 mg/dL (ref 20.0–300.0)
Ethanol: NEGATIVE %
Meperidine: NEGATIVE ng/mL
Methadone Screen, Urine: NEGATIVE ng/mL
OPIATE SCREEN URINE: NEGATIVE ng/mL
Oxycodone/Oxymorphone, Urine: NEGATIVE ng/mL
Tramadol: NEGATIVE ng/mL
pH, Urine: 6 (ref 4.5–8.9)

## 2023-05-10 LAB — CANNABINOID CONFIRMATION, UR: CANNABINOIDS: NEGATIVE

## 2023-05-11 LAB — CMP14+CBC/D/PLT+FER+RETIC+V...
ALT: 41 IU/L — ABNORMAL HIGH (ref 0–32)
AST: 54 IU/L — ABNORMAL HIGH (ref 0–40)
Albumin/Globulin Ratio: 1.5 (ref 1.2–2.2)
BUN: 13 mg/dL (ref 6–20)
Basophils Absolute: 0.1 10*3/uL (ref 0.0–0.2)
CO2: 17 mmol/L — ABNORMAL LOW (ref 20–29)
Calcium: 8.9 mg/dL (ref 8.7–10.2)
Chloride: 106 mmol/L (ref 96–106)
Creatinine, Ser: 0.94 mg/dL (ref 0.57–1.00)
EOS (ABSOLUTE): 1.1 10*3/uL — ABNORMAL HIGH (ref 0.0–0.4)
Eos: 9 %
Ferritin: 166 ng/mL — ABNORMAL HIGH (ref 15–150)
Globulin, Total: 2.7 g/dL (ref 1.5–4.5)
Hematocrit: 36 % (ref 34.0–46.6)
Immature Grans (Abs): 0 10*3/uL (ref 0.0–0.1)
Immature Granulocytes: 0 %
Lymphs: 39 %
MCH: 28.7 pg (ref 26.6–33.0)
MCV: 86 fL (ref 79–97)
Monocytes Absolute: 1 10*3/uL — ABNORMAL HIGH (ref 0.1–0.9)
Monocytes: 8 %
Neutrophils Absolute: 5 10*3/uL (ref 1.4–7.0)
Neutrophils: 43 %
RBC: 4.21 x10E6/uL (ref 3.77–5.28)
Retic Ct Pct: 3.6 % — ABNORMAL HIGH (ref 0.6–2.6)
Total Protein: 6.8 g/dL (ref 6.0–8.5)
Vit D, 25-Hydroxy: 15.5 ng/mL — ABNORMAL LOW (ref 30.0–100.0)
WBC: 11.9 10*3/uL — ABNORMAL HIGH (ref 3.4–10.8)

## 2023-05-19 ENCOUNTER — Telehealth: Payer: Self-pay | Admitting: Nurse Practitioner

## 2023-05-19 NOTE — Telephone Encounter (Signed)
Pt left a v/m and just needed a nurse to call her

## 2023-05-20 NOTE — Telephone Encounter (Signed)
Will do . KH 

## 2023-05-24 ENCOUNTER — Ambulatory Visit: Payer: Self-pay | Admitting: Family Medicine

## 2023-05-31 ENCOUNTER — Emergency Department (HOSPITAL_COMMUNITY)
Admission: EM | Admit: 2023-05-31 | Discharge: 2023-05-31 | Disposition: A | Discharge status: Home / Self Care | Payer: Medicaid Other | Attending: Emergency Medicine | Admitting: Emergency Medicine

## 2023-05-31 ENCOUNTER — Encounter (HOSPITAL_COMMUNITY): Payer: Self-pay

## 2023-05-31 ENCOUNTER — Other Ambulatory Visit: Payer: Self-pay

## 2023-05-31 DIAGNOSIS — M25571 Pain in right ankle and joints of right foot: Secondary | ICD-10-CM | POA: Diagnosis present

## 2023-05-31 DIAGNOSIS — Z91040 Latex allergy status: Secondary | ICD-10-CM | POA: Diagnosis not present

## 2023-05-31 DIAGNOSIS — D5700 Hb-SS disease with crisis, unspecified: Secondary | ICD-10-CM | POA: Insufficient documentation

## 2023-05-31 DIAGNOSIS — R739 Hyperglycemia, unspecified: Secondary | ICD-10-CM | POA: Insufficient documentation

## 2023-05-31 LAB — CBC WITH DIFFERENTIAL/PLATELET
Abs Immature Granulocytes: 0.04 10*3/uL (ref 0.00–0.07)
Basophils Absolute: 0.1 10*3/uL (ref 0.0–0.1)
Basophils Relative: 1 %
Eosinophils Absolute: 0.5 10*3/uL (ref 0.0–0.5)
Eosinophils Relative: 6 %
HCT: 33.9 % — ABNORMAL LOW (ref 36.0–46.0)
Hemoglobin: 12.2 g/dL (ref 12.0–15.0)
Immature Granulocytes: 0 %
Lymphocytes Relative: 33 %
Lymphs Abs: 2.9 10*3/uL (ref 0.7–4.0)
MCH: 28.3 pg (ref 26.0–34.0)
MCHC: 36 g/dL (ref 30.0–36.0)
MCV: 78.7 fL — ABNORMAL LOW (ref 80.0–100.0)
Monocytes Absolute: 0.8 10*3/uL (ref 0.1–1.0)
Monocytes Relative: 8 %
Neutro Abs: 4.6 10*3/uL (ref 1.7–7.7)
Neutrophils Relative %: 52 %
Platelets: 392 10*3/uL (ref 150–400)
RBC: 4.31 MIL/uL (ref 3.87–5.11)
RDW: 14.3 % (ref 11.5–15.5)
WBC: 9 10*3/uL (ref 4.0–10.5)
nRBC: 5.5 % — ABNORMAL HIGH (ref 0.0–0.2)

## 2023-05-31 LAB — COMPREHENSIVE METABOLIC PANEL
ALT: 21 U/L (ref 0–44)
AST: 22 U/L (ref 15–41)
Albumin: 3.9 g/dL (ref 3.5–5.0)
Alkaline Phosphatase: 61 U/L (ref 38–126)
Anion gap: 8 (ref 5–15)
BUN: 16 mg/dL (ref 6–20)
CO2: 23 mmol/L (ref 22–32)
Calcium: 9.1 mg/dL (ref 8.9–10.3)
Chloride: 105 mmol/L (ref 98–111)
Creatinine, Ser: 0.95 mg/dL (ref 0.44–1.00)
GFR, Estimated: >60 mL/min (ref >60)
Glucose, Bld: 114 mg/dL — ABNORMAL HIGH (ref 70–99)
Potassium: 4.2 mmol/L (ref 3.5–5.1)
Sodium: 136 mmol/L (ref 135–145)
Total Bilirubin: 0.8 mg/dL (ref 0.3–1.2)
Total Protein: 7.1 g/dL (ref 6.5–8.1)

## 2023-05-31 LAB — RETICULOCYTES
Immature Retic Fract: 29.1 % — ABNORMAL HIGH (ref 2.3–15.9)
RBC.: 4.24 MIL/uL (ref 3.87–5.11)
Retic Count, Absolute: 166.2 10*3/uL (ref 19.0–186.0)
Retic Ct Pct: 3.9 % — ABNORMAL HIGH (ref 0.4–3.1)

## 2023-05-31 LAB — I-STAT BETA HCG BLOOD, ED (MC, WL, AP ONLY): I-stat hCG, quantitative: <5 m[IU]/mL (ref <5)

## 2023-05-31 MED ORDER — HYDROMORPHONE HCL 1 MG/ML IJ SOLN
0.5000 mg | INTRAMUSCULAR | Status: AC
  Administered 2023-05-31: 0.5 mg via INTRAVENOUS
  Filled 2023-05-31: qty 1

## 2023-05-31 MED ORDER — ONDANSETRON HCL 4 MG/2ML IJ SOLN
4.0000 mg | Freq: Once | INTRAMUSCULAR | Status: AC
  Administered 2023-05-31: 4 mg via INTRAVENOUS
  Filled 2023-05-31: qty 2

## 2023-05-31 MED ORDER — HYDROCODONE-ACETAMINOPHEN 5-325 MG PO TABS
1.0000 | ORAL_TABLET | Freq: Once | ORAL | Status: AC
  Administered 2023-05-31: 1 via ORAL
  Filled 2023-05-31: qty 1

## 2023-05-31 MED ORDER — SODIUM CHLORIDE 0.45 % IV SOLN: INTRAVENOUS | Status: DC

## 2023-05-31 MED ORDER — ONDANSETRON 4 MG PO TBDP: 4.0000 mg | ORAL_TABLET | Freq: Three times a day (TID) | ORAL | 0 refills | Status: DC | PRN

## 2023-05-31 MED ORDER — HYDROMORPHONE HCL 1 MG/ML IJ SOLN
1.0000 mg | INTRAMUSCULAR | Status: AC
  Administered 2023-05-31: 1 mg via INTRAVENOUS
  Filled 2023-05-31: qty 1

## 2023-05-31 NOTE — ED Provider Notes (Signed)
[place] EMERGENCY DEPARTMENT AT [facility] Provider Note   CSN: [id number] Arrival date & time: 05/31/23  1104     History  Chief Complaint  Patient presents with   Sickle Cell Pain Crisis    Hannah Hawkins is a 37 y.o. female with a past medical history significant for sickle cell anemia, Anemia of chronic disease, cocaine abuse, and chronic pain syndrome who presents to the ED due to suspected sickle cell pain crisis.  Patient admits to pain "all over".  She notes pain is worse in her bilateral ankles which is typical for a pain crisis.  Trigger is typically change in weather. No fever or chills.  Denies chest pain and shortness of breath.  Admits to nausea however, no vomiting.  Admits to chronic diarrhea which she attributes to having her gallbladder removed.  Denies abdominal pain.  No urinary or vaginal symptoms.  Not currently on any chronic opioids.  Patient had a sickle cell appointment on 5/17 and it was decided not to start on chronic opioids and to attempt to control pain with ibuprofen and lidocaine patches due to concerns about cocaine use in the past.  History obtained from patient and past medical records. No interpreter used during encounter.       Home Medications Prior to Admission medications   Medication Sig Start Date End Date Taking? Authorizing Provider  ondansetron (ZOFRAN-ODT) 4 MG disintegrating tablet Take 1 tablet (4 mg total) by mouth every 8 (eight) hours as needed for nausea or vomiting. 05/31/23  Yes Camber Ninh C, PA-C  azelastine (ASTELIN) 0.1 % nasal spray Place 2 sprays into both nostrils 2 (two) times daily. Use in each nostril as directed Patient not taking: Reported on 04/12/2023 11/08/22   Donell Beers, FNP  fluticasone (FLONASE) 50 MCG/ACT nasal spray Place 2 sprays into both nostrils daily. 08/14/21   Barbette Merino, NP  ibuprofen (ADVIL) 600 MG tablet Take 1 tablet (600 mg total) by mouth every 8 (eight) hours as  needed. 05/06/23   Paseda, Baird Kay, FNP  lidocaine (HM LIDOCAINE PATCH) 4 % Place 1 patch onto the skin daily. 05/06/23   Paseda, Baird Kay, FNP  montelukast (SINGULAIR) 10 MG tablet Take 1 tablet (10 mg total) by mouth at bedtime. Patient not taking: Reported on 04/12/2023 08/14/21   Barbette Merino, NP  norethindrone (MICRONOR) 0.35 MG tablet Take 1 tablet (0.35 mg total) by mouth daily. Patient not taking: Reported on 04/12/2023 09/08/21   Barbette Merino, NP  omeprazole (PRILOSEC) 20 MG capsule Take 1 capsule (20 mg total) by mouth daily. Patient not taking: Reported on 05/06/2023 04/15/23   Ivonne Andrew, NP  ondansetron (ZOFRAN) 8 MG tablet Take 1 tablet (8 mg total) by mouth every 8 (eight) hours as needed for nausea or vomiting. 01/12/23   Ivonne Andrew, NP  oxyCODONE-acetaminophen (PERCOCET) 10-325 MG tablet Take 1 tablet by mouth every 6 (six) hours as needed for pain. Patient not taking: Reported on 05/06/2023 04/13/23 04/12/24  Massie Maroon, FNP  pantoprazole (PROTONIX) 40 MG tablet Take 1 tablet (40 mg total) by mouth daily. Patient not taking: Reported on 04/12/2023 09/07/21 04/12/23  Barbette Merino, NP  risperiDONE (RISPERDAL) 2 MG tablet Take 1 tablet (2 mg total) by mouth at bedtime. Patient not taking: Reported on 04/12/2023 05/10/22   Ardis Hughs, NP  sodium chloride (OCEAN) 0.65 % SOLN nasal spray Place 1 spray into both nostrils as needed for congestion. Patient  not taking: Reported on 04/12/2023 11/08/22   Donell Beers, FNP  TYLENOL 500 MG tablet Take 500-1,000 mg by mouth every 6 (six) hours as needed (for headaches). Patient not taking: Reported on 05/06/2023    [provider]  Vitamin D, Ergocalciferol, (DRISDOL) 1.25 MG (50000 UNIT) CAPS capsule Take 1 capsule (50,000 Units total) by mouth every 7 (seven) days. 12/06/22   Ivonne Andrew, NP      Allergies    Latex and Penicillins    Review of Systems   Review of Systems  Constitutional:   Negative for fever.  Respiratory:  Negative for shortness of breath.   Cardiovascular:  Negative for chest pain.  Musculoskeletal:  Positive for arthralgias.    Physical Exam Updated Vital Signs BP 129/85   Pulse 77   Temp 98.2 F (36.8 C) (Oral)   Resp 16   Ht 5\' 3"  (1.6 m)   Wt 70.8 kg   LMP 05/16/2023 (Approximate)   SpO2 100%   BMI 27.63 kg/m  Physical Exam Vitals and nursing note reviewed.  Constitutional:      General: She is not in acute distress.    Appearance: She is not ill-appearing.  HENT:     Head: Normocephalic.  Eyes:     Pupils: Pupils are equal, round, and reactive to light.  Cardiovascular:     Rate and Rhythm: Normal rate and regular rhythm.     Pulses: Normal pulses.     Heart sounds: Normal heart sounds. No murmur heard.    No friction rub. No gallop.  Pulmonary:     Effort: Pulmonary effort is normal.     Breath sounds: Normal breath sounds.  Abdominal:     General: Abdomen is flat. There is no distension.     Palpations: Abdomen is soft.     Tenderness: There is no abdominal tenderness. There is no guarding or rebound.  Musculoskeletal:        General: Normal range of motion.     Cervical back: Neck supple.     Comments: TTP throughout bilateral ankles without erythema, edema, or warmth.  Diffuse tenderness throughout body.  Skin:    General: Skin is warm and dry.  Neurological:     General: No focal deficit present.     Mental Status: She is alert.  Psychiatric:        Mood and Affect: Mood normal.        Behavior: Behavior normal.     ED Results / Procedures / Treatments   Labs (all labs ordered are listed, but only abnormal results are displayed) Labs Reviewed  CBC WITH DIFFERENTIAL/PLATELET - Abnormal; Notable for the following components:      Result Value   HCT 33.9 (*)    MCV 78.7 (*)    nRBC 5.5 (*)    All other components within normal limits  RETICULOCYTES - Abnormal; Notable for the following components:   Retic Ct  Pct 3.9 (*)    Immature Retic Fract 29.1 (*)    All other components within normal limits  COMPREHENSIVE METABOLIC PANEL - Abnormal; Notable for the following components:   Glucose, Bld 114 (*)    All other components within normal limits  I-STAT BETA HCG BLOOD, ED ([facility], [facility], [facility] ONLY)    EKG None  Radiology No results found.  Procedures Procedures    Medications Ordered in ED Medications  0.45 % sodium chloride infusion ( Intravenous New Bag/Given 05/31/23 1338)  HYDROcodone-acetaminophen (NORCO/VICODIN)  5-325 MG per tablet 1 tablet (has no administration in time range)  HYDROmorphone (DILAUDID) injection 0.5 mg (0.5 mg Intravenous Given 05/31/23 1338)  HYDROmorphone (DILAUDID) injection 1 mg (1 mg Intravenous Given 05/31/23 1412)  ondansetron (ZOFRAN) injection 4 mg (4 mg Intravenous Given 05/31/23 1340)    ED Course/ Medical Decision Making/ A&P                             Medical Decision Making Amount and/or Complexity of Data Reviewed External Data Reviewed: notes.    Details: Sickle cell outpatient note Labs: ordered. Decision-making details documented in ED Course.  Risk Prescription drug management.   This patient presents to the ED for concern of arthralgia, this involves an extensive number of treatment options, and is a complaint that carries with it a high risk of complications and morbidity.  The differential diagnosis includes sickle cell pain crisis, septic joint, infection, etc  37 year old female with history of sickle cell anemia presents to the ED due to suspected sickle cell pain crisis.  Patient endorses pain "all over".  Pain worse in her bilateral ankles which is typical for a pain crisis.  Not on chronic opioids due to concerns about cocaine use.  No fever or chills.  Endorses nausea however, no vomiting.  Denies chest pain and shortness of breath.  Upon arrival, vitals all within normal limits.  Patient in no acute distress.  Diffuse tenderness on exam  most significantly in bilateral ankles.  No signs of septic joint.  Sickle cell labs ordered in triage.  Will give dose of Dilaudid 0.5 and then 1 mg 30 minutes later given patient is not on chronic opioids.  Low suspicion for PE/DVT or acute chest syndrome given no chest pain or shortness of breath.  No infectious symptoms.  CBC reassuring.  No leukocytosis.  Normal hemoglobin.  Pregnancy test negative.  CMP significant for hyperglycemia at 114.  No anion gap.  Normal renal function.  Reticulocytes at baseline.  2:40 PM reassessed patient at bedside who notes improvement in pain.  Offered admission for sickle cell pain crisis however, patient declined.  Will give dose of oral hydrocodone and discharged home to follow-up with sickle cell clinic.  Patient also discharged with Zofran.  Abdomen soft, nondistended, nontender.  Low suspicion for acute abdomen.  Will hold off on CT abdomen.  Patient stable for discharge. Strict ED precautions discussed with patient. Patient states understanding and agrees to plan. Patient discharged home in no acute distress and stable vitals  Lives at home Has PCP       Final Clinical Impression(s) / ED Diagnoses Final diagnoses:  Sickle cell pain crisis [facility])    Rx / DC Orders ED Discharge Orders          Ordered    ondansetron (ZOFRAN-ODT) 4 MG disintegrating tablet  Every 8 hours PRN        05/31/23 1441              Mannie Stabile, PA-C 05/31/23 1443    Alvira Monday, MD 05/31/23 2124

## 2023-05-31 NOTE — ED Triage Notes (Signed)
Patient is here for evaluation of sickle cell pain. Reports pain started last night and she ran out of her pain medications. States her pain is all over.

## 2023-05-31 NOTE — ED Notes (Signed)
Pt refused cardiac monitoring until ordered by Dr. Theola Sequin aware.

## 2023-05-31 NOTE — Discharge Instructions (Addendum)
It was a pleasure taking care of you today.  As discussed, all of your labs were reassuring.  Please follow-up with the sickle cell clinic for further evaluation.  I am sending you home with nausea medication.  Take as needed for nausea.  Return to the ER for any worsening symptoms.

## 2023-06-06 ENCOUNTER — Encounter: Payer: Self-pay | Admitting: *Deleted

## 2023-06-06 NOTE — Congregational Nurse Program (Signed)
  Dept: [phone]   Congregational Nurse Program Note  Date of Encounter: 06/06/2023  Past Medical History: Past Medical History:  Diagnosis Date   Sickle cell anemia (HCC)     Encounter Details:  CNP Questionnaire - 06/06/23 1323       Questionnaire   Ask client: Do you give verbal consent for me to treat you today? Yes    Student Assistance N/A    Location Patient Served  [facility]    Visit Setting with Client Organization    Patient Status Unknown    Insurance Medicaid    Insurance/Financial Assistance Referral N/A    Medication Have Medication Insecurities    Medical Provider Yes    Screening Referrals Made N/A    Medical Referrals Made N/A    Medical Appointment Made Other    Recently w/o PCP, now 1st time PCP visit completed due to CNs referral or appointment made N/A    Food N/A    Transportation N/A    Housing/Utilities No permanent housing    Interpersonal Safety N/A    Interventions Advocate/Support    Abnormal to Normal Screening Since Last CN Visit N/A    Screenings CN Performed Blood Pressure;Pulse Ox;Weight    Sent Client to Lab for: N/A    Did client attend any of the following based off CNs referral or appointments made? N/A    ED Visit Averted N/A    Life-Saving Intervention Made N/A            Client came to nurse's office seeking help with pain treatment medications. Client reports she has sickle cell and has an appt with Dr Hart Rochester in August. She has been in the ED with break through pain. Explained that Clinical research associate was a Char[employer] fundraiser and could not prescribe medications. Offered support and encouragement. Client plans to come back to clinic on Wednesday and discuss pain relieving options with MD. Completed Triage Form.  Shavaughn Seidl W RN CN

## 2023-06-09 ENCOUNTER — Encounter (HOSPITAL_COMMUNITY): Payer: Self-pay

## 2023-06-09 ENCOUNTER — Other Ambulatory Visit: Payer: Self-pay

## 2023-06-09 ENCOUNTER — Emergency Department (HOSPITAL_COMMUNITY)
Admission: EM | Admit: 2023-06-09 | Discharge: 2023-06-10 | Disposition: A | Discharge status: Home / Self Care | Payer: Medicaid Other | Attending: Emergency Medicine | Admitting: Emergency Medicine

## 2023-06-09 DIAGNOSIS — G894 Chronic pain syndrome: Secondary | ICD-10-CM | POA: Insufficient documentation

## 2023-06-09 DIAGNOSIS — D5700 Hb-SS disease with crisis, unspecified: Secondary | ICD-10-CM

## 2023-06-09 DIAGNOSIS — Z91040 Latex allergy status: Secondary | ICD-10-CM | POA: Diagnosis not present

## 2023-06-09 DIAGNOSIS — D57219 Sickle-cell/Hb-C disease with crisis, unspecified: Secondary | ICD-10-CM | POA: Diagnosis not present

## 2023-06-09 LAB — COMPREHENSIVE METABOLIC PANEL
ALT: 55 U/L — ABNORMAL HIGH (ref 0–44)
AST: 58 U/L — ABNORMAL HIGH (ref 15–41)
Albumin: 4.2 g/dL (ref 3.5–5.0)
Alkaline Phosphatase: 56 U/L (ref 38–126)
Anion gap: 6 (ref 5–15)
BUN: 8 mg/dL (ref 6–20)
CO2: 23 mmol/L (ref 22–32)
Calcium: 8.4 mg/dL — ABNORMAL LOW (ref 8.9–10.3)
Chloride: 109 mmol/L (ref 98–111)
Creatinine, Ser: 0.95 mg/dL (ref 0.44–1.00)
GFR, Estimated: >60 mL/min (ref >60)
Glucose, Bld: 92 mg/dL (ref 70–99)
Potassium: 3.7 mmol/L (ref 3.5–5.1)
Sodium: 138 mmol/L (ref 135–145)
Total Bilirubin: 1 mg/dL (ref 0.3–1.2)
Total Protein: 7.5 g/dL (ref 6.5–8.1)

## 2023-06-09 LAB — RETICULOCYTES
Immature Retic Fract: 36.9 % — ABNORMAL HIGH (ref 2.3–15.9)
RBC.: 3.91 MIL/uL (ref 3.87–5.11)
Retic Count, Absolute: 174 10*3/uL (ref 19.0–186.0)
Retic Ct Pct: 4.5 % — ABNORMAL HIGH (ref 0.4–3.1)

## 2023-06-09 LAB — CBC WITH DIFFERENTIAL/PLATELET
Abs Immature Granulocytes: 0.05 10*3/uL (ref 0.00–0.07)
Basophils Absolute: 0.1 10*3/uL (ref 0.0–0.1)
Basophils Relative: 1 %
Eosinophils Absolute: 0.7 10*3/uL — ABNORMAL HIGH (ref 0.0–0.5)
Eosinophils Relative: 6 %
HCT: 30.5 % — ABNORMAL LOW (ref 36.0–46.0)
Hemoglobin: 11.2 g/dL — ABNORMAL LOW (ref 12.0–15.0)
Immature Granulocytes: 0 %
Lymphocytes Relative: 25 %
Lymphs Abs: 3 10*3/uL (ref 0.7–4.0)
MCH: 28.6 pg (ref 26.0–34.0)
MCHC: 36.7 g/dL — ABNORMAL HIGH (ref 30.0–36.0)
MCV: 78 fL — ABNORMAL LOW (ref 80.0–100.0)
Monocytes Absolute: 1.2 10*3/uL — ABNORMAL HIGH (ref 0.1–1.0)
Monocytes Relative: 10 %
Neutro Abs: 7 10*3/uL (ref 1.7–7.7)
Neutrophils Relative %: 58 %
Platelets: 331 10*3/uL (ref 150–400)
RBC: 3.91 MIL/uL (ref 3.87–5.11)
RDW: 14.2 % (ref 11.5–15.5)
WBC: 12.1 10*3/uL — ABNORMAL HIGH (ref 4.0–10.5)
nRBC: 2.3 % — ABNORMAL HIGH (ref 0.0–0.2)

## 2023-06-09 LAB — I-STAT BETA HCG BLOOD, ED (MC, WL, AP ONLY): I-stat hCG, quantitative: <5 m[IU]/mL (ref <5)

## 2023-06-09 MED ORDER — HYDROMORPHONE HCL 2 MG/ML IJ SOLN
2.0000 mg | INTRAMUSCULAR | Status: AC
  Administered 2023-06-09: 2 mg via INTRAVENOUS
  Filled 2023-06-09: qty 1

## 2023-06-09 MED ORDER — KETOROLAC TROMETHAMINE 15 MG/ML IJ SOLN
15.0000 mg | INTRAMUSCULAR | Status: AC
  Administered 2023-06-09: 15 mg via INTRAVENOUS
  Filled 2023-06-09: qty 1

## 2023-06-09 MED ORDER — ONDANSETRON HCL 4 MG/2ML IJ SOLN
4.0000 mg | Freq: Once | INTRAMUSCULAR | Status: AC
  Administered 2023-06-09: 4 mg via INTRAVENOUS
  Filled 2023-06-09: qty 2

## 2023-06-09 NOTE — ED Triage Notes (Signed)
BIB EMS from [facility] for sickle cell crisis that started last night. Pt states she is out of her sickle cell medications for pain control. Took ibuprofen with no relief.

## 2023-06-09 NOTE — Discharge Instructions (Signed)
Please call and follow-up closely with your sickle cell specialist for further managements of your condition. 

## 2023-06-09 NOTE — ED Provider Notes (Signed)
[place] EMERGENCY DEPARTMENT AT [facility] Provider Note   CSN: [id number] Arrival date & time: 06/09/23  1612     History  Chief Complaint  Patient presents with   Sickle Cell Pain Crisis    Elisandra Zetts is a 37 y.o. female.  The history is provided by the patient and medical records. No language interpreter was used.  Sickle Cell Pain Crisis    37 year old female significant history of sickle cell disease, chronic pain syndrome, cocaine use, brought here via EMS from [facility] for concerns of sickle cell crisis.  Patient states over the past week she has had pain to legs that felt similar to her prior sickle cell crisis.  Today she noticed increasing pain primarily on her right thigh and buttock region.  She felt like she is having a sickle cell crisis and contributed to weather changes.  She does not endorse any fever or chills no chest pain or shortness of breath or productive cough no nausea vomiting diarrhea.  She is without maintenance medication for the past several months and states she does not have an appointment with the sickle cell specialist until August.  Home Medications Prior to Admission medications   Medication Sig Start Date End Date Taking? Authorizing Provider  azelastine (ASTELIN) 0.1 % nasal spray Place 2 sprays into both nostrils 2 (two) times daily. Use in each nostril as directed Patient not taking: Reported on 04/12/2023 11/08/22   Donell Beers, FNP  fluticasone (FLONASE) 50 MCG/ACT nasal spray Place 2 sprays into both nostrils daily. 08/14/21   Barbette Merino, NP  ibuprofen (ADVIL) 600 MG tablet Take 1 tablet (600 mg total) by mouth every 8 (eight) hours as needed. 05/06/23   Paseda, Baird Kay, FNP  lidocaine (HM LIDOCAINE PATCH) 4 % Place 1 patch onto the skin daily. 05/06/23   Paseda, Baird Kay, FNP  montelukast (SINGULAIR) 10 MG tablet Take 1 tablet (10 mg total) by mouth at bedtime. Patient not taking: Reported on  04/12/2023 08/14/21   Barbette Merino, NP  norethindrone (MICRONOR) 0.35 MG tablet Take 1 tablet (0.35 mg total) by mouth daily. Patient not taking: Reported on 04/12/2023 09/08/21   Barbette Merino, NP  omeprazole (PRILOSEC) 20 MG capsule Take 1 capsule (20 mg total) by mouth daily. Patient not taking: Reported on 05/06/2023 04/15/23   Ivonne Andrew, NP  ondansetron (ZOFRAN) 8 MG tablet Take 1 tablet (8 mg total) by mouth every 8 (eight) hours as needed for nausea or vomiting. 01/12/23   Ivonne Andrew, NP  ondansetron (ZOFRAN-ODT) 4 MG disintegrating tablet Take 1 tablet (4 mg total) by mouth every 8 (eight) hours as needed for nausea or vomiting. 05/31/23   Mannie Stabile, PA-C  oxyCODONE-acetaminophen (PERCOCET) 10-325 MG tablet Take 1 tablet by mouth every 6 (six) hours as needed for pain. Patient not taking: Reported on 05/06/2023 04/13/23 04/12/24  Massie Maroon, FNP  pantoprazole (PROTONIX) 40 MG tablet Take 1 tablet (40 mg total) by mouth daily. Patient not taking: Reported on 04/12/2023 09/07/21 04/12/23  Barbette Merino, NP  risperiDONE (RISPERDAL) 2 MG tablet Take 1 tablet (2 mg total) by mouth at bedtime. Patient not taking: Reported on 04/12/2023 05/10/22   Ardis Hughs, NP  sodium chloride (OCEAN) 0.65 % SOLN nasal spray Place 1 spray into both nostrils as needed for congestion. Patient not taking: Reported on 04/12/2023 11/08/22   Donell Beers, FNP  TYLENOL 500 MG tablet Take 500-1,000  mg by mouth every 6 (six) hours as needed (for headaches). Patient not taking: Reported on 05/06/2023    [provider]  Vitamin D, Ergocalciferol, (DRISDOL) 1.25 MG (50000 UNIT) CAPS capsule Take 1 capsule (50,000 Units total) by mouth every 7 (seven) days. 12/06/22   Ivonne Andrew, NP      Allergies    Latex and Penicillins    Review of Systems   Review of Systems  All other systems reviewed and are negative.   Physical Exam Updated Vital Signs BP 118/85 (BP  Location: Left Arm)   Pulse (!) 102   Temp 97.6 F (36.4 C) (Oral)   Resp 18   Ht 5\' 3"  (1.6 m)   Wt 74 kg   LMP 05/16/2023 (Approximate)   SpO2 98%   BMI 28.90 kg/m  Physical Exam Vitals and nursing note reviewed.  Constitutional:      General: She is not in acute distress.    Appearance: She is well-developed.  HENT:     Head: Atraumatic.  Eyes:     Conjunctiva/sclera: Conjunctivae normal.  Cardiovascular:     Rate and Rhythm: Normal rate and regular rhythm.     Pulses: Normal pulses.     Heart sounds: Normal heart sounds.  Pulmonary:     Effort: Pulmonary effort is normal.  Abdominal:     Palpations: Abdomen is soft.  Musculoskeletal:        General: Tenderness (Diffuse tenderness throughout bilateral lower EXTR without focal tenderness.  No peripheral edema noted) present. Normal range of motion.     Cervical back: Neck supple.  Skin:    Capillary Refill: Capillary refill takes less than 2 seconds.     Findings: No rash.  Neurological:     Mental Status: She is alert.  Psychiatric:        Mood and Affect: Mood normal.     ED Results / Procedures / Treatments   Labs (all labs ordered are listed, but only abnormal results are displayed) Labs Reviewed  COMPREHENSIVE METABOLIC PANEL - Abnormal; Notable for the following components:      Result Value   Calcium 8.4 (*)    AST 58 (*)    ALT 55 (*)    All other components within normal limits  CBC WITH DIFFERENTIAL/PLATELET - Abnormal; Notable for the following components:   WBC 12.1 (*)    Hemoglobin 11.2 (*)    HCT 30.5 (*)    MCV 78.0 (*)    MCHC 36.7 (*)    nRBC 2.3 (*)    Monocytes Absolute 1.2 (*)    Eosinophils Absolute 0.7 (*)    All other components within normal limits  RETICULOCYTES - Abnormal; Notable for the following components:   Retic Ct Pct 4.5 (*)    Immature Retic Fract 36.9 (*)    All other components within normal limits  I-STAT BETA HCG BLOOD, ED ([facility], [facility], [facility] ONLY)     EKG None  Radiology No results found.  Procedures Procedures    Medications Ordered in ED Medications  ketorolac (TORADOL) 15 MG/ML injection 15 mg (15 mg Intravenous Given 06/09/23 1950)  HYDROmorphone (DILAUDID) injection 2 mg (2 mg Intravenous Given 06/09/23 1951)  HYDROmorphone (DILAUDID) injection 2 mg (2 mg Intravenous Given 06/09/23 2043)  HYDROmorphone (DILAUDID) injection 2 mg (2 mg Intravenous Given 06/09/23 2147)  ondansetron (ZOFRAN) injection 4 mg (4 mg Intravenous Given 06/09/23 2043)    ED Course/ Medical Decision Making/ A&P  Medical Decision Making Amount and/or Complexity of Data Reviewed Labs: ordered.  Risk Prescription drug management.   BP 118/85 (BP Location: Left Arm)   Pulse (!) 102   Temp 97.6 F (36.4 C) (Oral)   Resp 18   Ht 5\' 3"  (1.6 m)   Wt 74 kg   LMP 05/16/2023 (Approximate)   SpO2 98%   BMI 28.90 kg/m   37:57 PM  37 year old female significant history of sickle cell disease, chronic pain syndrome, cocaine use, brought here via EMS from [facility] for concerns of sickle cell crisis.  Patient states over the past week she has had pain to legs that felt similar to her prior sickle cell crisis.  Today she noticed increasing pain primarily on her right thigh and buttock region.  She felt like she is having a sickle cell crisis and contributed to weather changes.  She does not endorse any fever or chills no chest pain or shortness of breath or productive cough no nausea vomiting diarrhea.  She is without maintenance medication for the past several months and states she does not have an appointment with the sickle cell specialist until August.  On exam, patient is laying bed appears to be in no acute discomfort.  Heart with normal rate and rhythm, lungs clear to auscultation bilaterally abdomen soft nontender, diffuse tenderness to bilateral lower extremities without focal point tenderness no significant  peripheral edema noted.  She is neurovascular intact no calf tenderness.  Vital signs reviewed and overall reassuring no fever no hypoxia.  Low suspicion for acute chest at this time.  -Labs ordered, independently viewed and interpreted by me.  Labs remarkable for WBC 12.1 -The patient was maintained on a cardiac monitor.  I personally viewed and interpreted the cardiac monitored which showed an underlying rhythm of: NSR -Imaging including CXR considered but without chest pain, sob or fever or cough I doubt pt is having acute chest -This patient presents to the ED for concern of pain, this involves an extensive number of treatment options, and is a complaint that carries with it a high risk of complications and morbidity.  The differential diagnosis includes chronic pain syndrome, sickle cell crisis, infection, strain, sprain, cellulitis, dvt -Co morbidities that complicate the patient evaluation includes sickle cell disease, chronic pain -Treatment includes toradol, dilaudid, zofran -Reevaluation of the patient after these medicines showed that the patient improved -PCP office notes or outside notes reviewed -Escalation to admission/observation considered: patients feels much better, is comfortable with discharge, and will follow up with PCP -Prescription medication considered, patient comfortable with f/u with sickle cell clinic for refill of her opiate medication -Social Determinant of Health considered which includes depression, lack of transportation            Final Clinical Impression(s) / ED Diagnoses Final diagnoses:  Chronic pain syndrome  Sickle-cell disease with pain [facility])    Rx / DC Orders ED Discharge Orders     None         Fayrene Helper, PA-C 06/09/23 2322    Linwood Dibbles, MD 06/10/23 [phone]

## 2023-06-09 NOTE — ED Notes (Signed)
Pt vomiting. Greta Doom, Pa notified

## 2023-06-10 MED ORDER — ONDANSETRON 4 MG PO TBDP
4.0000 mg | ORAL_TABLET | Freq: Once | ORAL | Status: AC
  Administered 2023-06-10: 4 mg via ORAL
  Filled 2023-06-10: qty 1

## 2023-06-10 NOTE — ED Provider Notes (Signed)
Blood pressure 124/82, pulse 78, temperature 97.6 F (36.4 C), temperature source Oral, resp. rate 16, height 5\' 3"  (1.6 m), weight 74 kg, last menstrual period 05/16/2023, SpO2 94 %.  In short, Hannah Hawkins is a 37 y.o. female with a chief complaint of Sickle Cell Pain Crisis .  Refer to the original H&P for additional details.  01:37 AM I was asked to come and reevaluate the patient.  She been up for discharge but near the time of leaving the patient's nurse states that she seemed to suddenly be under the influence of something.  She seemed more groggy and had some slurred speech.  Patient denies taking anything not given to her in the hospital.  On my assessment she is sitting in bed with a full emesis bag.  She is speaking with me and following instructions.  I do not have any immediate airway concerns or need for Narcan or other reversal medications.  Plan to give Zofran and continue monitoring here with plan for likely discharge.    Maia Plan, MD 06/10/23 [phone]

## 2023-06-13 ENCOUNTER — Emergency Department (HOSPITAL_COMMUNITY)
Admission: EM | Admit: 2023-06-13 | Discharge: 2023-06-13 | Disposition: A | Discharge status: Home / Self Care | Payer: Medicaid Other | Attending: Emergency Medicine | Admitting: Emergency Medicine

## 2023-06-13 ENCOUNTER — Encounter: Payer: Self-pay | Admitting: *Deleted

## 2023-06-13 ENCOUNTER — Encounter: Payer: Self-pay | Admitting: Gastroenterology

## 2023-06-13 ENCOUNTER — Emergency Department (HOSPITAL_COMMUNITY): Payer: Medicaid Other

## 2023-06-13 ENCOUNTER — Other Ambulatory Visit: Payer: Self-pay

## 2023-06-13 ENCOUNTER — Encounter (HOSPITAL_COMMUNITY): Payer: Self-pay

## 2023-06-13 DIAGNOSIS — Z91040 Latex allergy status: Secondary | ICD-10-CM | POA: Insufficient documentation

## 2023-06-13 DIAGNOSIS — D5700 Hb-SS disease with crisis, unspecified: Secondary | ICD-10-CM | POA: Diagnosis present

## 2023-06-13 LAB — CBC WITH DIFFERENTIAL/PLATELET
Abs Immature Granulocytes: 0.04 10*3/uL (ref 0.00–0.07)
Basophils Absolute: 0.1 10*3/uL (ref 0.0–0.1)
Basophils Relative: 2 %
Eosinophils Absolute: 0.7 10*3/uL — ABNORMAL HIGH (ref 0.0–0.5)
Eosinophils Relative: 8 %
HCT: 30.3 % — ABNORMAL LOW (ref 36.0–46.0)
Hemoglobin: 10.9 g/dL — ABNORMAL LOW (ref 12.0–15.0)
Immature Granulocytes: 1 %
Lymphocytes Relative: 27 %
Lymphs Abs: 2.4 10*3/uL (ref 0.7–4.0)
MCH: 28.3 pg (ref 26.0–34.0)
MCHC: 36 g/dL (ref 30.0–36.0)
MCV: 78.7 fL — ABNORMAL LOW (ref 80.0–100.0)
Monocytes Absolute: 0.9 10*3/uL (ref 0.1–1.0)
Monocytes Relative: 10 %
Neutro Abs: 4.7 10*3/uL (ref 1.7–7.7)
Neutrophils Relative %: 52 %
Platelets: 394 10*3/uL (ref 150–400)
RBC: 3.85 MIL/uL — ABNORMAL LOW (ref 3.87–5.11)
RDW: 14.3 % (ref 11.5–15.5)
WBC: 8.8 10*3/uL (ref 4.0–10.5)
nRBC: 3.9 % — ABNORMAL HIGH (ref 0.0–0.2)

## 2023-06-13 LAB — COMPREHENSIVE METABOLIC PANEL
ALT: 89 U/L — ABNORMAL HIGH (ref 0–44)
AST: 52 U/L — ABNORMAL HIGH (ref 15–41)
Albumin: 4 g/dL (ref 3.5–5.0)
Alkaline Phosphatase: 49 U/L (ref 38–126)
Anion gap: 5 (ref 5–15)
BUN: 7 mg/dL (ref 6–20)
CO2: 26 mmol/L (ref 22–32)
Calcium: 8.6 mg/dL — ABNORMAL LOW (ref 8.9–10.3)
Chloride: 106 mmol/L (ref 98–111)
Creatinine, Ser: 0.78 mg/dL (ref 0.44–1.00)
GFR, Estimated: >60 mL/min (ref >60)
Glucose, Bld: 86 mg/dL (ref 70–99)
Potassium: 3.9 mmol/L (ref 3.5–5.1)
Sodium: 137 mmol/L (ref 135–145)
Total Bilirubin: 0.9 mg/dL (ref 0.3–1.2)
Total Protein: 7.2 g/dL (ref 6.5–8.1)

## 2023-06-13 LAB — RETICULOCYTES
Immature Retic Fract: 38.8 % — ABNORMAL HIGH (ref 2.3–15.9)
RBC.: 3.88 MIL/uL (ref 3.87–5.11)
Retic Count, Absolute: 235.1 10*3/uL — ABNORMAL HIGH (ref 19.0–186.0)
Retic Ct Pct: 6.1 % — ABNORMAL HIGH (ref 0.4–3.1)

## 2023-06-13 MED ORDER — HYDROMORPHONE HCL 1 MG/ML IJ SOLN
1.0000 mg | Freq: Once | INTRAMUSCULAR | Status: AC
  Administered 2023-06-13: 1 mg via INTRAVENOUS
  Filled 2023-06-13: qty 1

## 2023-06-13 MED ORDER — SODIUM CHLORIDE 0.45 % IV SOLN: INTRAVENOUS | Status: DC

## 2023-06-13 MED ORDER — SODIUM CHLORIDE 0.9 % IV SOLN
12.5000 mg | Freq: Once | INTRAVENOUS | Status: DC
  Filled 2023-06-13: qty 0.25

## 2023-06-13 MED ORDER — ONDANSETRON HCL 4 MG/2ML IJ SOLN
4.0000 mg | INTRAMUSCULAR | Status: DC | PRN
  Administered 2023-06-13: 4 mg via INTRAVENOUS
  Filled 2023-06-13: qty 2

## 2023-06-13 MED ORDER — OXYCODONE HCL 5 MG PO TABS
10.0000 mg | ORAL_TABLET | Freq: Once | ORAL | Status: AC
  Administered 2023-06-13: 10 mg via ORAL
  Filled 2023-06-13: qty 2

## 2023-06-13 NOTE — ED Notes (Signed)
Patient refusing to leave until she has ginger ale and ice. RN informed patient that she could have it on the way out. Patient started yelling at RN and continues to refuse to leave the room.

## 2023-06-13 NOTE — ED Triage Notes (Signed)
Pt BIB [facility] for a sickle cell pain crisis. Pt was seen last week for dame, but pain has worsened since discharged. Pt having bilateral leg pain with associated nausea and decreased appetite. Pt denies CP or ShOB. EMS report CBG 100.

## 2023-06-13 NOTE — ED Provider Notes (Signed)
[place] [facility] EMERGENCY DE[facility] AT [facility] Provider Note   CSN: [id number] Arrival date & time: 06/13/23  1058     History  Chief Complaint  Patient presents with   Sickle Cell Pain Crisis    Hannah Hawkins is a 37 y.o. female.  37 year old female with prior medical history as detailed below presents for evaluation.  Patient reports that she was seen last week for similar complaint.  Patient reports history of sickle cell.  Patient reports that she is currently staying at the [facility].  She reports persistent bilateral leg pain with associated nausea.  This is consistent with prior sickle cell painful crises.  She denies fever.  She denies chest pain or shortness of breath.  She does not currently have any prescribed medications for pain management in the outpatient setting.      The history is provided by the patient and medical records.       Home Medications Prior to Admission medications   Medication Sig Start Date End Date Taking? Authorizing Provider  azelastine (ASTELIN) 0.1 % nasal spray Place 2 sprays into both nostrils 2 (two) times daily. Use in each nostril as directed Patient not taking: Reported on 04/12/2023 11/08/22   Donell Beers, FNP  fluticasone (FLONASE) 50 MCG/ACT nasal spray Place 2 sprays into both nostrils daily. 08/14/21   Barbette Merino, NP  ibuprofen (ADVIL) 600 MG tablet Take 1 tablet (600 mg total) by mouth every 8 (eight) hours as needed. 05/06/23   Paseda, Baird Kay, FNP  lidocaine (HM LIDOCAINE PATCH) 4 % Place 1 patch onto the skin daily. 05/06/23   Paseda, Baird Kay, FNP  montelukast (SINGULAIR) 10 MG tablet Take 1 tablet (10 mg total) by mouth at bedtime. Patient not taking: Reported on 04/12/2023 08/14/21   Barbette Merino, NP  norethindrone (MICRONOR) 0.35 MG tablet Take 1 tablet (0.35 mg total) by mouth daily. Patient not taking: Reported on 04/12/2023 09/08/21   Barbette Merino, NP  omeprazole  (PRILOSEC) 20 MG capsule Take 1 capsule (20 mg total) by mouth daily. Patient not taking: Reported on 05/06/2023 04/15/23   Ivonne Andrew, NP  ondansetron (ZOFRAN) 8 MG tablet Take 1 tablet (8 mg total) by mouth every 8 (eight) hours as needed for nausea or vomiting. 01/12/23   Ivonne Andrew, NP  ondansetron (ZOFRAN-ODT) 4 MG disintegrating tablet Take 1 tablet (4 mg total) by mouth every 8 (eight) hours as needed for nausea or vomiting. 05/31/23   Mannie Stabile, PA-C  oxyCODONE-acetaminophen (PERCOCET) 10-325 MG tablet Take 1 tablet by mouth every 6 (six) hours as needed for pain. Patient not taking: Reported on 05/06/2023 04/13/23 04/12/24  Massie Maroon, FNP  pantoprazole (PROTONIX) 40 MG tablet Take 1 tablet (40 mg total) by mouth daily. Patient not taking: Reported on 04/12/2023 09/07/21 04/12/23  Barbette Merino, NP  risperiDONE (RISPERDAL) 2 MG tablet Take 1 tablet (2 mg total) by mouth at bedtime. Patient not taking: Reported on 04/12/2023 05/10/22   Ardis Hughs, NP  sodium chloride (OCEAN) 0.65 % SOLN nasal spray Place 1 spray into both nostrils as needed for congestion. Patient not taking: Reported on 04/12/2023 11/08/22   Donell Beers, FNP  TYLENOL 500 MG tablet Take 500-1,000 mg by mouth every 6 (six) hours as needed (for headaches). Patient not taking: Reported on 05/06/2023    [provider]  Vitamin D, Ergocalciferol, (DRISDOL) 1.25 MG (50000 UNIT) CAPS capsule Take 1  capsule (50,000 Units total) by mouth every 7 (seven) days. 12/06/22   Ivonne Andrew, NP      Allergies    Latex and Penicillins    Review of Systems   Review of Systems  All other systems reviewed and are negative.   Physical Exam Updated Vital Signs BP 107/83 (BP Location: Right Arm)   Pulse 73   Temp 98.1 F (36.7 C) (Oral)   Ht 5\' 3"  (1.6 m)   Wt 73.9 kg   LMP 05/16/2023 (Approximate)   SpO2 100%   BMI 28.87 kg/m  Physical Exam Vitals and nursing note reviewed.   Constitutional:      General: She is not in acute distress.    Appearance: Normal appearance. She is well-developed.  HENT:     Head: Normocephalic and atraumatic.  Eyes:     Conjunctiva/sclera: Conjunctivae normal.     Pupils: Pupils are equal, round, and reactive to light.  Cardiovascular:     Rate and Rhythm: Normal rate and regular rhythm.     Heart sounds: Normal heart sounds.  Pulmonary:     Effort: Pulmonary effort is normal. No respiratory distress.     Breath sounds: Normal breath sounds.  Abdominal:     General: There is no distension.     Palpations: Abdomen is soft.     Tenderness: There is no abdominal tenderness.  Musculoskeletal:        General: No deformity. Normal range of motion.     Cervical back: Normal range of motion and neck supple.  Skin:    General: Skin is warm and dry.  Neurological:     General: No focal deficit present.     Mental Status: She is alert and oriented to person, place, and time.     ED Results / Procedures / Treatments   Labs (all labs ordered are listed, but only abnormal results are displayed) Labs Reviewed  CBC WITH DIFFERENTIAL/PLATELET - Abnormal; Notable for the following components:      Result Value   RBC 3.85 (*)    Hemoglobin 10.9 (*)    HCT 30.3 (*)    MCV 78.7 (*)    All other components within normal limits  COMPREHENSIVE METABOLIC PANEL  RETICULOCYTES    EKG None  Radiology No results found.  Procedures Procedures    Medications Ordered in ED Medications  0.45 % sodium chloride infusion ( Intravenous New Bag/Given 06/13/23 1200)  diphenhydrAMINE (BENADRYL) 12.5 mg in sodium chloride 0.9 % 50 mL IVPB (has no administration in time range)  ondansetron (ZOFRAN) injection 4 mg (4 mg Intravenous Given 06/13/23 1200)  oxyCODONE (Oxy IR/ROXICODONE) immediate release tablet 10 mg (has no administration in time range)  HYDROmorphone (DILAUDID) injection 1 mg (1 mg Intravenous Given 06/13/23 1202)    ED  Course/ Medical Decision Making/ A&P                             Medical Decision Making Amount and/or Complexity of Data Reviewed Labs: ordered. Radiology: ordered.  Risk Prescription drug management.    Medical Screen Complete  This patient presented to the ED with complaint of leg pain, sickle cell crisis, nausea.  This complaint involves an extensive number of treatment options. The initial differential diagnosis includes, but is not limited to, sickle cell painful crisis, malingering, metabolic abnormality, etc.  This presentation is: Chronic, Self-Limited, Previously Undiagnosed, Uncertain Prognosis, Complicated, Systemic Symptoms, and Threat to  Life/Bodily Function  Patient reports sickle cell painful crisis.  Patient does not appear to be in significant discomfort at time of evaluation.  However, out of recognition of the difficulties of dealing with chronic pain in the setting of sickle cell patient provided with pain medications here in the ED.  Screening labs obtained are without significant abnormality.  Screening studies obtained are without significant abnormality.  Patient requested right upper quadrant ultrasound.  No appreciable tenderness in the right upper quadrant on exam.  Screening LFTs are without significant abnormality.  Patient reported history of cholecystectomy in the past.  Patient advised that completion of workup that continued ED evaluation and/or inpatient care was not appropriate.  Patient advised to follow-up closely with the sickle cell clinic.  Patient advised that chronic pain medications cannot be refilled from the ED.  Patient appeared to be comfortable with plan with discharge at time of discharge.  Importance of close follow-up is repeatedly stressed.  Strict return precautions given and understood.  Additional history obtained: External records from outside sources obtained and reviewed including prior ED visits and prior Inpatient  records.    Lab Tests:  I ordered and personally interpreted labs.   Imaging Studies ordered:  I ordered imaging studies including right upper quadrant ultrasound I independently visualized and interpreted obtained imaging which showed NAD I agree with the radiologist interpretation.   Cardiac Monitoring:  The patient was maintained on a cardiac monitor.  I personally viewed and interpreted the cardiac monitor which showed an underlying rhythm of: NSR   Medicines ordered:  I ordered medication including narcotics, antiemetics for sickle cell pain Reevaluation of the patient after these medicines showed that the patient: improved    Problem List / ED Course:  Sickle cell painful crisis   Reevaluation:  After the interventions noted above, I reevaluated the patient and found that they have: improved  Disposition:  After consideration of the diagnostic results and the patients response to treatment, I feel that the patent would benefit from close outpatient follow-up.          Final Clinical Impression(s) / ED Diagnoses Final diagnoses:  Sickle cell pain crisis [facility])    Rx / DC Orders ED Discharge Orders     None         Wynetta Fines, MD 06/13/23 [phone]

## 2023-06-13 NOTE — Discharge Instructions (Signed)
Return for any problem.  ?

## 2023-06-13 NOTE — Congregational Nurse Program (Signed)
  Dept: [phone]   Congregational Nurse Program Note  Date of Encounter: 06/13/2023  Past Medical History: Past Medical History:  Diagnosis Date   Sickle cell anemia (HCC)     Encounter Details:  CNP Questionnaire - 06/13/23 1000       Questionnaire   Ask client: Do you give verbal consent for me to treat you today? Yes    Student Assistance N/A    Location Patient Served  [facility]    Visit Setting with Client Organization;Phone/Text/Email    Patient Status Unhoused    Insurance Medicaid    Insurance/Financial Assistance Referral N/A    Medication Have Medication Insecurities    Medical Provider Yes    Screening Referrals Made N/A    Medical Referrals Made N/A    Medical Appointment Made N/A    Recently w/o PCP, now 1st time PCP visit completed due to CNs referral or appointment made N/A    Food N/A    Transportation N/A    Housing/Utilities No permanent housing    Interpersonal Safety N/A    Interventions Advocate/Support    Abnormal to Normal Screening Since Last CN Visit N/A    Screenings CN Performed Blood Pressure;Pulse Ox;Temperature    Sent Client to Lab for: N/A    Did client attend any of the following based off CNs referral or appointments made? N/A    ED Visit Averted N/A    Life-Saving Intervention Made N/A            Client came to nurse's office reporting upper mid abdominal pain of a 10 on 0=10 scale with 10 being the most. Client reports she has been unable to eat solid food since June 20th due to burning pain when she eats. She also reports pain in her legs which she contributes to sickle cell crisis. Checked vitals.BP 113/78 P 95 T 97.4 Pulse O2 97%  Per epic she had a referral made to [facility] on 6/7. Called for appt and next available is 08/25/23. Appointment made for 8:30 am. Offered to contact PCP and client declined asking to go to ED. Called EMS for client.  Helios Kohlmann W RN CN

## 2023-06-17 ENCOUNTER — Other Ambulatory Visit: Payer: Self-pay

## 2023-06-17 DIAGNOSIS — D5720 Sickle-cell/Hb-C disease without crisis: Secondary | ICD-10-CM

## 2023-06-17 NOTE — Telephone Encounter (Signed)
Caller & Relationship to patient:  MRN #  [id number]   Call Back Number:   Date of Last Office Visit: 05/24/2023     Date of Next Office Visit: 07/15/2023    Medication(s) to be Refilled: Lidocane and she also said that one of the nurses called her about another med.  Please advise  Preferred Pharmacy:   ** Please notify patient to allow 48-72 hours to process** **Let patient know to contact pharmacy at the end of the day to make sure medication is ready. ** **If patient has not been seen in a year or longer, book an appointment **Advise to use MyChart for refill requests OR to contact their pharmacy

## 2023-06-17 NOTE — Telephone Encounter (Signed)
Please advise KH 

## 2023-06-18 MED ORDER — LIDOCAINE 4 % EX PTCH
1.0000 | MEDICATED_PATCH | CUTANEOUS | 0 refills | Status: DC
Start: 2023-06-18 — End: 2023-06-22

## 2023-06-21 ENCOUNTER — Other Ambulatory Visit: Payer: Self-pay

## 2023-06-21 ENCOUNTER — Emergency Department (HOSPITAL_COMMUNITY)
Admission: EM | Admit: 2023-06-21 | Discharge: 2023-06-22 | Disposition: A | Discharge status: Home / Self Care | Payer: MEDICAID | Attending: Emergency Medicine | Admitting: Emergency Medicine

## 2023-06-21 DIAGNOSIS — D5700 Hb-SS disease with crisis, unspecified: Secondary | ICD-10-CM | POA: Diagnosis present

## 2023-06-21 DIAGNOSIS — Z91040 Latex allergy status: Secondary | ICD-10-CM | POA: Insufficient documentation

## 2023-06-21 MED ORDER — HYDROMORPHONE HCL 1 MG/ML IJ SOLN
0.5000 mg | INTRAMUSCULAR | Status: AC
  Administered 2023-06-22: 0.5 mg via INTRAVENOUS
  Filled 2023-06-21: qty 1

## 2023-06-21 MED ORDER — HYDROMORPHONE HCL 1 MG/ML IJ SOLN
1.0000 mg | INTRAMUSCULAR | Status: AC
  Administered 2023-06-22: 1 mg via INTRAVENOUS
  Filled 2023-06-21: qty 1

## 2023-06-21 MED ORDER — DIPHENHYDRAMINE HCL 25 MG PO CAPS: 25.0000 mg | ORAL_CAPSULE | ORAL | Status: DC | PRN

## 2023-06-21 MED ORDER — SODIUM CHLORIDE 0.45 % IV SOLN: INTRAVENOUS | Status: DC

## 2023-06-21 MED ORDER — ONDANSETRON HCL 4 MG/2ML IJ SOLN
4.0000 mg | INTRAMUSCULAR | Status: DC | PRN
  Administered 2023-06-22: 4 mg via INTRAVENOUS
  Filled 2023-06-21: qty 2

## 2023-06-21 MED ORDER — KETOROLAC TROMETHAMINE 15 MG/ML IJ SOLN
15.0000 mg | INTRAMUSCULAR | Status: AC
  Administered 2023-06-22: 15 mg via INTRAVENOUS
  Filled 2023-06-21: qty 1

## 2023-06-21 NOTE — ED Provider Notes (Signed)
EMERGENCY DEPARTMENT AT [facility] Provider Note   CSN: [id number] Arrival date & time: 06/21/23  2141     History  Chief Complaint  Patient presents with   Sickle Cell Pain Crisis    Hannah Hawkins is a 37 y.o. female.   Sickle Cell Pain Crisis    Patient has history of sickle cell disease chronic pain syndrome who presents ED with complaints of pain in her lower extremities.  Patient feels like she is having a sickle cell pain crisis.  Patient states her symptoms started a few hours ago.  She does not have any medications for pain available at home.  Patient states she usually comes to the emergency room.  She has not follow-up with the sickle cell clinic.  Patient has seen primary care doctor who would not prescribe opiates..  She was referred to pain management but denied.  Patient has declined hydroxyurea.  Patient has seen her PCP in May of this year.  Plan was for Advil and lidocaine  Home Medications Prior to Admission medications   Medication Sig Start Date End Date Taking? Authorizing Provider  ibuprofen (ADVIL) 600 MG tablet Take 1 tablet (600 mg total) by mouth every 8 (eight) hours as needed. 05/06/23   Paseda, Baird Kay, FNP  lidocaine (HM LIDOCAINE PATCH) 4 % Place 1 patch onto the skin daily. 06/18/23   Massie Maroon, FNP  omeprazole (PRILOSEC) 20 MG capsule Take 1 capsule (20 mg total) by mouth daily. Patient not taking: Reported on 05/06/2023 04/15/23   Ivonne Andrew, NP  ondansetron (ZOFRAN) 8 MG tablet Take 1 tablet (8 mg total) by mouth every 8 (eight) hours as needed for nausea or vomiting. 01/12/23   Ivonne Andrew, NP  ondansetron (ZOFRAN-ODT) 4 MG disintegrating tablet Take 1 tablet (4 mg total) by mouth every 8 (eight) hours as needed for nausea or vomiting. 05/31/23   Mannie Stabile, PA-C  oxyCODONE-acetaminophen (PERCOCET) 10-325 MG tablet Take 1 tablet by mouth every 6 (six) hours as needed for pain. Patient not taking:  Reported on 05/06/2023 04/13/23 04/12/24  Massie Maroon, FNP  Vitamin D, Ergocalciferol, (DRISDOL) 1.25 MG (50000 UNIT) CAPS capsule Take 1 capsule (50,000 Units total) by mouth every 7 (seven) days. 12/06/22   Ivonne Andrew, NP      Allergies    Latex and Penicillins    Review of Systems   Review of Systems  Physical Exam Updated Vital Signs BP (!) 132/96 (BP Location: Left Arm)   Pulse 94   Temp 98.4 F (36.9 C) (Oral)   Resp 20   Ht 1.6 m (5\' 3" )   Wt 73.9 kg   LMP 05/16/2023 (Approximate)   SpO2 100%   BMI 28.87 kg/m  Physical Exam Vitals and nursing note reviewed.  Constitutional:      Appearance: She is well-developed. She is not diaphoretic.  HENT:     Head: Normocephalic and atraumatic.     Right Ear: External ear normal.     Left Ear: External ear normal.  Eyes:     General: No scleral icterus.       Right eye: No discharge.        Left eye: No discharge.     Conjunctiva/sclera: Conjunctivae normal.  Neck:     Trachea: No tracheal deviation.  Cardiovascular:     Rate and Rhythm: Normal rate.  Pulmonary:     Effort: Pulmonary effort is normal. No respiratory distress.  Breath sounds: Normal breath sounds. No stridor.  Abdominal:     General: Bowel sounds are normal. There is no distension.  Musculoskeletal:        General: No swelling or deformity.     Cervical back: Neck supple.     Right lower leg: No edema.     Left lower leg: No edema.  Skin:    General: Skin is warm and dry.     Findings: No rash.  Neurological:     Mental Status: She is alert. Mental status is at baseline.     Cranial Nerves: No dysarthria or facial asymmetry.     Motor: No seizure activity.     ED Results / Procedures / Treatments   Labs (all labs ordered are listed, but only abnormal results are displayed) Labs Reviewed - No data to display  EKG None  Radiology No results found.  Procedures Procedures    Medications Ordered in ED Medications   ketorolac (TORADOL) 15 MG/ML injection 15 mg (has no administration in time range)  HYDROmorphone (DILAUDID) injection 0.5 mg (has no administration in time range)  HYDROmorphone (DILAUDID) injection 1 mg (has no administration in time range)  diphenhydrAMINE (BENADRYL) capsule 25-50 mg (has no administration in time range)  ondansetron (ZOFRAN) injection 4 mg (has no administration in time range)  0.45 % sodium chloride infusion (has no administration in time range)    ED Course/ Medical Decision Making/ A&P                             Medical Decision Making Amount and/or Complexity of Data Reviewed Labs: ordered.  Risk Prescription drug management.   Presents with complaints of sickle cell pain crisis.  Patient does have history of sickle cell disease.  Currently ibuprofen and lidocaine patches, no chronic opiates or hydroxyurea for her.  Labs and pain medications have been ordered.  Care turned over to Dr. Jacqulyn Bath at shift change        Final Clinical Impression(s) / ED Diagnoses Final diagnoses:  None    Rx / DC Orders ED Discharge Orders     None         Linwood Dibbles, MD 06/21/23 2350

## 2023-06-21 NOTE — ED Triage Notes (Signed)
Pt reports sickle cell pain to bilateral legs that started a couple hours ago. Pt reports she does not have a pain regimen to follow at home.

## 2023-06-22 ENCOUNTER — Other Ambulatory Visit: Payer: Self-pay | Admitting: Nurse Practitioner

## 2023-06-22 DIAGNOSIS — D5720 Sickle-cell/Hb-C disease without crisis: Secondary | ICD-10-CM

## 2023-06-22 LAB — CBC
HCT: 30.7 % — ABNORMAL LOW (ref 36.0–46.0)
Hemoglobin: 11 g/dL — ABNORMAL LOW (ref 12.0–15.0)
MCH: 27.8 pg (ref 26.0–34.0)
MCHC: 35.8 g/dL (ref 30.0–36.0)
MCV: 77.5 fL — ABNORMAL LOW (ref 80.0–100.0)
Platelets: 382 10*3/uL (ref 150–400)
RBC: 3.96 MIL/uL (ref 3.87–5.11)
RDW: 13.2 % (ref 11.5–15.5)
WBC: 10.8 10*3/uL — ABNORMAL HIGH (ref 4.0–10.5)
nRBC: 3.9 % — ABNORMAL HIGH (ref 0.0–0.2)

## 2023-06-22 LAB — RETICULOCYTES
Immature Retic Fract: 39 % — ABNORMAL HIGH (ref 2.3–15.9)
RBC.: 3.98 MIL/uL (ref 3.87–5.11)
Retic Count, Absolute: 193.4 10*3/uL — ABNORMAL HIGH (ref 19.0–186.0)
Retic Ct Pct: 4.9 % — ABNORMAL HIGH (ref 0.4–3.1)

## 2023-06-22 LAB — BASIC METABOLIC PANEL
Anion gap: 5 (ref 5–15)
BUN: 7 mg/dL (ref 6–20)
CO2: 25 mmol/L (ref 22–32)
Calcium: 8.4 mg/dL — ABNORMAL LOW (ref 8.9–10.3)
Chloride: 105 mmol/L (ref 98–111)
Creatinine, Ser: 0.78 mg/dL (ref 0.44–1.00)
GFR, Estimated: >60 mL/min (ref >60)
Glucose, Bld: 102 mg/dL — ABNORMAL HIGH (ref 70–99)
Potassium: 3.6 mmol/L (ref 3.5–5.1)
Sodium: 135 mmol/L (ref 135–145)

## 2023-06-22 LAB — HCG, SERUM, QUALITATIVE: Preg, Serum: NEGATIVE

## 2023-06-22 MED ORDER — OXYCODONE-ACETAMINOPHEN 5-325 MG PO TABS
1.0000 | ORAL_TABLET | Freq: Once | ORAL | Status: AC
  Administered 2023-06-22: 1 via ORAL
  Filled 2023-06-22: qty 1

## 2023-06-22 MED ORDER — LIDOCAINE 4 % EX PTCH
1.0000 | MEDICATED_PATCH | CUTANEOUS | 0 refills | Status: DC
Start: 2023-06-22 — End: 2023-07-03

## 2023-06-22 MED ORDER — HYDROMORPHONE HCL 1 MG/ML IJ SOLN
1.0000 mg | INTRAMUSCULAR | Status: AC
  Administered 2023-06-22: 1 mg via INTRAVENOUS
  Filled 2023-06-22: qty 1

## 2023-06-22 NOTE — ED Provider Notes (Signed)
Blood pressure (!) 132/96, pulse 94, temperature 98.4 F (36.9 C), temperature source Oral, resp. rate 20, height 5\' 3"  (1.6 m), weight 73.9 kg, last menstrual period 05/16/2023, SpO2 100 %.  Assuming care from Dr. Lynelle Doctor.  In short, Hannah Hawkins is a 37 y.o. female with a chief complaint of Sickle Cell Pain Crisis .  Refer to the original H&P for additional details.  The current plan of care is to follow up after pain mgmt.  01:18 AM  Patient's pain improving. CBC with mild anemia and elevated retic count. Pregnancy negative.   02:56 AM Patient feeling improved after IV pain medication. She feels ready for discharge.    Maia Plan, MD 06/22/23 [phone]

## 2023-06-22 NOTE — Progress Notes (Signed)
Unit staff was able to establish PIV access.

## 2023-06-26 ENCOUNTER — Emergency Department (HOSPITAL_COMMUNITY)
Admission: EM | Admit: 2023-06-26 | Discharge: 2023-06-27 | Disposition: A | Discharge status: Home / Self Care | Payer: MEDICAID | Attending: Emergency Medicine | Admitting: Emergency Medicine

## 2023-06-26 ENCOUNTER — Encounter (HOSPITAL_COMMUNITY): Payer: Self-pay

## 2023-06-26 ENCOUNTER — Other Ambulatory Visit: Payer: Self-pay

## 2023-06-26 DIAGNOSIS — M79604 Pain in right leg: Secondary | ICD-10-CM | POA: Diagnosis present

## 2023-06-26 DIAGNOSIS — D5700 Hb-SS disease with crisis, unspecified: Secondary | ICD-10-CM | POA: Diagnosis not present

## 2023-06-26 MED ORDER — HYDROMORPHONE HCL 1 MG/ML IJ SOLN
0.5000 mg | INTRAMUSCULAR | Status: AC
  Administered 2023-06-26: 0.5 mg via INTRAVENOUS
  Filled 2023-06-26: qty 1

## 2023-06-26 MED ORDER — SODIUM CHLORIDE 0.45 % IV SOLN: INTRAVENOUS | Status: DC

## 2023-06-26 MED ORDER — ONDANSETRON HCL 4 MG/2ML IJ SOLN
4.0000 mg | INTRAMUSCULAR | Status: DC | PRN
  Administered 2023-06-26: 4 mg via INTRAVENOUS
  Filled 2023-06-26: qty 2

## 2023-06-26 MED ORDER — HYDROMORPHONE HCL 1 MG/ML IJ SOLN
1.0000 mg | INTRAMUSCULAR | Status: AC
  Administered 2023-06-26: 1 mg via INTRAVENOUS
  Filled 2023-06-26: qty 1

## 2023-06-26 MED ORDER — HYDROMORPHONE HCL 1 MG/ML IJ SOLN
1.0000 mg | INTRAMUSCULAR | Status: AC
  Administered 2023-06-27: 1 mg via INTRAVENOUS
  Filled 2023-06-26: qty 1

## 2023-06-26 MED ORDER — ACETAMINOPHEN 325 MG PO TABS
650.0000 mg | ORAL_TABLET | Freq: Once | ORAL | Status: AC
  Administered 2023-06-26: 650 mg via ORAL
  Filled 2023-06-26: qty 2

## 2023-06-26 MED ORDER — DIPHENHYDRAMINE HCL 25 MG PO CAPS
25.0000 mg | ORAL_CAPSULE | ORAL | Status: DC | PRN
  Administered 2023-06-26: 25 mg via ORAL
  Filled 2023-06-26: qty 1

## 2023-06-26 NOTE — ED Triage Notes (Signed)
Pt bib ems for sickle cell pain, generalized for a few days. N/V 22G RAC. fentanyl and 4mg  zofran given by EMS  EMS vitals  BP 140/98  P 78  Spo2 100 RA

## 2023-06-26 NOTE — ED Provider Notes (Signed)
[facility]-EMERGENCY DEPT [facility] Emergency Department Provider Note MRN:  [id number]  Arrival date & time: 06/27/23     Chief Complaint   Sickle Cell Pain Crisis (Pt bib ems for sickle cell pain, generalized for a few days. N/V 22G RAC. fentanyl and 4mg  zofran given by EMS//EMS vitals /BP 140/98 /P 78 /Spo2 100 RA) and Emesis   History of Present Illness   Hannah Hawkins is a 37 y.o. year-old female presents to the ED with chief complaint of sickle cell pain crisis.  Seen recently for the same.  States that the pain is in her bilateral lower extremities.  She rates the pain as severe.  She also states that she has been having vomiting with this episode.  She denies fever, chills, cp, SOB.    History provided by patient.   Review of Systems  Pertinent positive and negative review of systems noted in HPI.    Physical Exam   Vitals:   06/27/23 0200 06/27/23 0215  BP: (!) 140/94   Pulse: 83 82  Resp: 18 16  Temp:    SpO2: 99% 100%    CONSTITUTIONAL:  non toxic-appearing, NAD NEURO:  Alert and oriented x 3, CN 3-12 grossly intact EYES:  eyes equal and reactive ENT/NECK:  Supple, no stridor  CARDIO:  normal rate, regular rhythm, appears well-perfused  PULM:  No respiratory distress, CTAB GI/GU:  non-distended,  MSK/SPINE:  No gross deformities, no edema, moves all extremities  SKIN:  no rash, atraumatic   *Additional and/or pertinent findings included in MDM below  Diagnostic and Interventional Summary    EKG Interpretation Date/Time:    Ventricular Rate:    PR Interval:    QRS Duration:    QT Interval:    QTC Calculation:   R Axis:      Text Interpretation:         Labs Reviewed  CBC WITH DIFFERENTIAL/PLATELET - Abnormal; Notable for the following components:      Result Value   Hemoglobin 11.9 (*)    HCT 32.9 (*)    MCV 76.5 (*)    MCHC 36.2 (*)    nRBC 2.4 (*)    All other components within normal limits  COMPREHENSIVE METABOLIC PANEL -  Abnormal; Notable for the following components:   Glucose, Bld 109 (*)    BUN <5 (*)    Calcium 8.4 (*)    All other components within normal limits  RETICULOCYTES - Abnormal; Notable for the following components:   Retic Ct Pct 4.6 (*)    Retic Count, Absolute 196.9 (*)    Immature Retic Fract 31.2 (*)    All other components within normal limits  HCG, QUANTITATIVE, PREGNANCY    No orders to display    Medications  diphenhydrAMINE (BENADRYL) capsule 25-50 mg (25 mg Oral Given 06/26/23 2259)  ondansetron (ZOFRAN) injection 4 mg (4 mg Intravenous Given 06/26/23 2259)  0.45 % sodium chloride infusion (0 mLs Intravenous Stopped 06/27/23 0158)  HYDROmorphone (DILAUDID) injection 0.5 mg (0.5 mg Intravenous Given 06/26/23 2259)  HYDROmorphone (DILAUDID) injection 1 mg (1 mg Intravenous Given 06/26/23 2343)  HYDROmorphone (DILAUDID) injection 1 mg (1 mg Intravenous Given 06/27/23 0026)  acetaminophen (TYLENOL) tablet 650 mg (650 mg Oral Given 06/26/23 2259)  HYDROmorphone (DILAUDID) injection 1 mg (1 mg Intravenous Given 06/27/23 0213)  ondansetron (ZOFRAN-ODT) disintegrating tablet 4 mg (4 mg Oral Given 06/27/23 0231)     Procedures  /  Critical Care Procedures  ED Course and  Medical Decision Making  I have reviewed the triage vital signs, the nursing notes, and pertinent available records from the EMR.  Social Determinants Affecting Complexity of Care: Patient has no clinically significant social determinants affecting this chief complaint..   ED Course:    Medical Decision Making Patient here with sickle cell pain.  History of the same.  States that this feels similar to her prior episodes.  Denies fever, chills, or cough.  Denies chest pain or shortness of breath.  Will check labs and treat pain.  Labs are about baseline for patient.  She is feeling significantly improved after treatments in the ED.  Will discharge home.  Return precautions discussed.  Patient is agreeable with the  plan.  Amount and/or Complexity of Data Reviewed Labs: ordered.  Risk OTC drugs. Prescription drug management.         Consultants: No consultations were needed in caring for this patient.   Treatment and Plan: I considered admission due to patient's initial presentation, but after considering the examination and diagnostic results, patient will not require admission and can be discharged with outpatient follow-up.    Final Clinical Impressions(s) / ED Diagnoses     ICD-10-CM   1. Sickle cell anemia with pain [facility])  D57.00       ED Discharge Orders          Ordered    ibuprofen (ADVIL) 800 MG tablet  3 times daily        06/27/23 0146              Discharge Instructions Discussed with and Provided to Patient:   Discharge Instructions   None      Roxy Horseman, PA-C 06/27/23 Scarlette Ar, [place] K, DO 06/29/23 1557

## 2023-06-27 LAB — RETICULOCYTES
Immature Retic Fract: 31.2 % — ABNORMAL HIGH (ref 2.3–15.9)
RBC.: 4.29 MIL/uL (ref 3.87–5.11)
Retic Count, Absolute: 196.9 10*3/uL — ABNORMAL HIGH (ref 19.0–186.0)
Retic Ct Pct: 4.6 % — ABNORMAL HIGH (ref 0.4–3.1)

## 2023-06-27 LAB — CBC WITH DIFFERENTIAL/PLATELET
Abs Immature Granulocytes: 0.04 10*3/uL (ref 0.00–0.07)
Basophils Absolute: 0.1 10*3/uL (ref 0.0–0.1)
Basophils Relative: 1 %
Eosinophils Absolute: 0.5 10*3/uL (ref 0.0–0.5)
Eosinophils Relative: 5 %
HCT: 32.9 % — ABNORMAL LOW (ref 36.0–46.0)
Hemoglobin: 11.9 g/dL — ABNORMAL LOW (ref 12.0–15.0)
Immature Granulocytes: 0 %
Lymphocytes Relative: 21 %
Lymphs Abs: 2.1 10*3/uL (ref 0.7–4.0)
MCH: 27.7 pg (ref 26.0–34.0)
MCHC: 36.2 g/dL — ABNORMAL HIGH (ref 30.0–36.0)
MCV: 76.5 fL — ABNORMAL LOW (ref 80.0–100.0)
Monocytes Absolute: 0.6 10*3/uL (ref 0.1–1.0)
Monocytes Relative: 6 %
Neutro Abs: 6.7 10*3/uL (ref 1.7–7.7)
Neutrophils Relative %: 67 %
Platelets: 385 10*3/uL (ref 150–400)
RBC: 4.3 MIL/uL (ref 3.87–5.11)
RDW: 13.2 % (ref 11.5–15.5)
WBC: 10 10*3/uL (ref 4.0–10.5)
nRBC: 2.4 % — ABNORMAL HIGH (ref 0.0–0.2)

## 2023-06-27 LAB — COMPREHENSIVE METABOLIC PANEL
ALT: 19 U/L (ref 0–44)
AST: 23 U/L (ref 15–41)
Albumin: 4 g/dL (ref 3.5–5.0)
Alkaline Phosphatase: 56 U/L (ref 38–126)
Anion gap: 9 (ref 5–15)
BUN: <5 mg/dL — ABNORMAL LOW (ref 6–20)
CO2: 24 mmol/L (ref 22–32)
Calcium: 8.4 mg/dL — ABNORMAL LOW (ref 8.9–10.3)
Chloride: 105 mmol/L (ref 98–111)
Creatinine, Ser: 0.64 mg/dL (ref 0.44–1.00)
GFR, Estimated: >60 mL/min (ref >60)
Glucose, Bld: 109 mg/dL — ABNORMAL HIGH (ref 70–99)
Potassium: 3.6 mmol/L (ref 3.5–5.1)
Sodium: 138 mmol/L (ref 135–145)
Total Bilirubin: 0.5 mg/dL (ref 0.3–1.2)
Total Protein: 7.6 g/dL (ref 6.5–8.1)

## 2023-06-27 LAB — HCG, QUANTITATIVE, PREGNANCY: hCG, Beta Chain, Quant, S: <1 m[IU]/mL (ref <5)

## 2023-06-27 MED ORDER — ONDANSETRON 4 MG PO TBDP
4.0000 mg | ORAL_TABLET | Freq: Once | ORAL | Status: AC
  Administered 2023-06-27: 4 mg via ORAL
  Filled 2023-06-27: qty 1

## 2023-06-27 MED ORDER — IBUPROFEN 800 MG PO TABS: 800.0000 mg | ORAL_TABLET | Freq: Three times a day (TID) | ORAL | 0 refills | Status: DC

## 2023-06-27 MED ORDER — HYDROMORPHONE HCL 1 MG/ML IJ SOLN
1.0000 mg | Freq: Once | INTRAMUSCULAR | Status: AC
  Administered 2023-06-27: 1 mg via INTRAVENOUS
  Filled 2023-06-27: qty 1

## 2023-06-27 NOTE — Telephone Encounter (Signed)
Medication sent in by provider. Gh

## 2023-06-29 ENCOUNTER — Encounter (HOSPITAL_COMMUNITY): Payer: Self-pay

## 2023-06-29 ENCOUNTER — Emergency Department (HOSPITAL_COMMUNITY)
Admission: EM | Admit: 2023-06-29 | Discharge: 2023-06-30 | Disposition: A | Discharge status: Home / Self Care | Payer: MEDICAID | Attending: Emergency Medicine | Admitting: Emergency Medicine

## 2023-06-29 ENCOUNTER — Other Ambulatory Visit: Payer: Self-pay

## 2023-06-29 DIAGNOSIS — R110 Nausea: Secondary | ICD-10-CM | POA: Diagnosis not present

## 2023-06-29 DIAGNOSIS — Z91040 Latex allergy status: Secondary | ICD-10-CM | POA: Diagnosis not present

## 2023-06-29 DIAGNOSIS — G43909 Migraine, unspecified, not intractable, without status migrainosus: Secondary | ICD-10-CM | POA: Insufficient documentation

## 2023-06-29 DIAGNOSIS — D649 Anemia, unspecified: Secondary | ICD-10-CM | POA: Diagnosis not present

## 2023-06-29 DIAGNOSIS — D5700 Hb-SS disease with crisis, unspecified: Secondary | ICD-10-CM | POA: Diagnosis not present

## 2023-06-29 MED ORDER — KETOROLAC TROMETHAMINE 15 MG/ML IJ SOLN
15.0000 mg | Freq: Once | INTRAMUSCULAR | Status: AC
  Administered 2023-06-29: 15 mg via INTRAVENOUS
  Filled 2023-06-29: qty 1

## 2023-06-29 MED ORDER — DIPHENHYDRAMINE HCL 50 MG/ML IJ SOLN
25.0000 mg | Freq: Once | INTRAMUSCULAR | Status: AC
  Administered 2023-06-29: 25 mg via INTRAVENOUS
  Filled 2023-06-29: qty 1

## 2023-06-29 MED ORDER — ONDANSETRON HCL 4 MG/2ML IJ SOLN
4.0000 mg | Freq: Once | INTRAMUSCULAR | Status: AC
  Administered 2023-06-29: 4 mg via INTRAVENOUS
  Filled 2023-06-29: qty 2

## 2023-06-29 MED ORDER — ACETAMINOPHEN 325 MG PO TABS
650.0000 mg | ORAL_TABLET | Freq: Once | ORAL | Status: DC
  Filled 2023-06-29: qty 2

## 2023-06-29 MED ORDER — SODIUM CHLORIDE 0.9 % IV SOLN: INTRAVENOUS | Status: DC

## 2023-06-29 MED ORDER — HYDROMORPHONE HCL 1 MG/ML IJ SOLN
1.0000 mg | Freq: Once | INTRAMUSCULAR | Status: AC
  Administered 2023-06-29: 1 mg via INTRAVENOUS
  Filled 2023-06-29: qty 1

## 2023-06-29 NOTE — ED Triage Notes (Signed)
Patient arrives from [facility] with C/O generalized sickle cell pain that started today after being outside for a while.  Also reports nausea and vomiting

## 2023-06-29 NOTE — ED Provider Notes (Signed)
[place] EMERGENCY DEPARTMENT AT [facility] Provider Note   CSN: [id number] Arrival date & time: 06/29/23  2157     History  Chief Complaint  Patient presents with   Sickle Cell Pain Crisis    Hannah Hawkins is a 37 y.o. female.  37 year old female with history of sickle cell disease presents today for concern of sickle cell pain crisis.  She states she is staying at a homeless shelter and has been out in the heat most of the day until she is allowed to go back into the shelter at 6 PM.  She states she is having pain all over from head to toe with the exception of chest pain or abdominal pain.  Endorses a migraine headache as well.  No vision change.  States she is taken ibuprofen.  Does not have any other pain medications at home.  Has been having a flareup of her sickle cell pain crisis almost every 3 days.  She attributes this to the weather.  No vomiting, but endorses mild nausea.  The history is provided by the patient. No language interpreter was used.       Home Medications Prior to Admission medications   Medication Sig Start Date End Date Taking? Authorizing Provider  ibuprofen (ADVIL) 800 MG tablet Take 1 tablet (800 mg total) by mouth 3 (three) times daily. 06/27/23   Roxy Horseman, PA-C  lidocaine (HM LIDOCAINE PATCH) 4 % Place 1 patch onto the skin daily. 06/22/23   Ivonne Andrew, NP      Allergies    Latex and Penicillins    Review of Systems   Review of Systems  Constitutional:  Negative for chills and fever.  Eyes:  Negative for visual disturbance.  Respiratory:  Negative for cough and shortness of breath.   Cardiovascular:  Negative for chest pain.  Gastrointestinal:  Positive for nausea. Negative for abdominal pain and vomiting.  Neurological:  Positive for headaches. Negative for light-headedness.  All other systems reviewed and are negative.   Physical Exam Updated Vital Signs BP (!) 147/83 (BP Location: Left Arm)   Pulse 84    Temp 97.7 F (36.5 C) (Oral)   Resp 18   Ht 5\' 3"  (1.6 m)   Wt 73.9 kg   LMP 05/16/2023 (Approximate)   SpO2 99%   BMI 28.87 kg/m  Physical Exam Vitals and nursing note reviewed.  Constitutional:      General: She is not in acute distress.    Appearance: Normal appearance. She is not ill-appearing.  HENT:     Head: Normocephalic and atraumatic.     Nose: Nose normal.  Eyes:     General: No scleral icterus.    Extraocular Movements: Extraocular movements intact.     Conjunctiva/sclera: Conjunctivae normal.  Cardiovascular:     Rate and Rhythm: Normal rate and regular rhythm.     Heart sounds: Normal heart sounds.  Pulmonary:     Effort: Pulmonary effort is normal. No respiratory distress.     Breath sounds: Normal breath sounds. No wheezing or rales.  Abdominal:     General: There is no distension.     Palpations: Abdomen is soft.     Tenderness: There is no abdominal tenderness. There is no guarding.  Musculoskeletal:        General: Normal range of motion.     Cervical back: Normal range of motion.  Skin:    General: Skin is warm and dry.  Neurological:  General: No focal deficit present.     Mental Status: She is alert and oriented to person, place, and time. Mental status is at baseline.     ED Results / Procedures / Treatments   Labs (all labs ordered are listed, but only abnormal results are displayed) Labs Reviewed  CBC WITH DIFFERENTIAL/PLATELET  COMPREHENSIVE METABOLIC PANEL  RETICULOCYTES    EKG EKG Interpretation Date/Time:  Wednesday June 29 2023 22:09:23 EDT Ventricular Rate:  85 PR Interval:  160 QRS Duration:  81 QT Interval:  374 QTC Calculation: 445 R Axis:   67  Text Interpretation: Sinus rhythm Confirmed by Alvino Blood ([id number]) on 06/29/2023 10:25:48 PM  Radiology No results found.  Procedures Procedures    Medications Ordered in ED Medications  HYDROmorphone (DILAUDID) injection 1 mg (has no administration in time  range)  0.9 %  sodium chloride infusion (has no administration in time range)  diphenhydrAMINE (BENADRYL) injection 25 mg (has no administration in time range)  ketorolac (TORADOL) 15 MG/ML injection 15 mg (has no administration in time range)  ondansetron (ZOFRAN) injection 4 mg (has no administration in time range)  acetaminophen (TYLENOL) tablet 650 mg (has no administration in time range)    ED Course/ Medical Decision Making/ A&P                             Medical Decision Making Amount and/or Complexity of Data Reviewed Labs: ordered.  Risk OTC drugs. Prescription drug management.   37 year old female with a past medical history significant for sickle cell disease presents today for evaluation of what she states is her sickle cell pain crisis.  States it feels similar to her prior sickle cell pain crisis.  She is having pain all over.  Denies any chest pain, cough, shortness of breath that would raise suspicion for acute chest syndrome.  Also endorses a migraine headache.  Will obtain labs, provide medication for symptom management.  On reevaluation patient reports resolution of the headache.  Pain improved however states has not completely gone away and is requesting another dose of pain medication.  Pain significantly improved.  CBC is without leukocytosis.  Mild anemia at her baseline.  CMP shows potassium of 3.3 otherwise without acute concerns.  Reticulocyte panel is within normal limit.  EKG without acute ischemic changes.   Final Clinical Impression(s) / ED Diagnoses Final diagnoses:  Sickle cell pain crisis [facility])    Rx / DC Orders ED Discharge Orders          Ordered    etodolac (LODINE) 400 MG tablet  2 times daily        06/30/23 0237              Marita [place], PA-C 06/30/23 0246    Lonell Grandchild, MD 06/30/23 [phone]

## 2023-06-30 LAB — CBC WITH DIFFERENTIAL/PLATELET
Abs Immature Granulocytes: 0.09 10*3/uL — ABNORMAL HIGH (ref 0.00–0.07)
Basophils Absolute: 0.2 10*3/uL — ABNORMAL HIGH (ref 0.0–0.1)
Basophils Relative: 1 %
Eosinophils Absolute: 1.4 10*3/uL — ABNORMAL HIGH (ref 0.0–0.5)
Eosinophils Relative: 14 %
HCT: 31.1 % — ABNORMAL LOW (ref 36.0–46.0)
Hemoglobin: 11.4 g/dL — ABNORMAL LOW (ref 12.0–15.0)
Immature Granulocytes: 1 %
Lymphocytes Relative: 35 %
Lymphs Abs: 3.6 10*3/uL (ref 0.7–4.0)
MCH: 28.1 pg (ref 26.0–34.0)
MCHC: 36.7 g/dL — ABNORMAL HIGH (ref 30.0–36.0)
MCV: 76.6 fL — ABNORMAL LOW (ref 80.0–100.0)
Monocytes Absolute: 1.1 10*3/uL — ABNORMAL HIGH (ref 0.1–1.0)
Monocytes Relative: 11 %
Neutro Abs: 4 10*3/uL (ref 1.7–7.7)
Neutrophils Relative %: 38 %
Platelets: 351 10*3/uL (ref 150–400)
RBC: 4.06 MIL/uL (ref 3.87–5.11)
RDW: 13.2 % (ref 11.5–15.5)
WBC: 10.4 10*3/uL (ref 4.0–10.5)
nRBC: 2.3 % — ABNORMAL HIGH (ref 0.0–0.2)

## 2023-06-30 LAB — COMPREHENSIVE METABOLIC PANEL
ALT: 21 U/L (ref 0–44)
AST: 24 U/L (ref 15–41)
Albumin: 4.4 g/dL (ref 3.5–5.0)
Alkaline Phosphatase: 59 U/L (ref 38–126)
Anion gap: 7 (ref 5–15)
BUN: 5 mg/dL — ABNORMAL LOW (ref 6–20)
CO2: 24 mmol/L (ref 22–32)
Calcium: 8.6 mg/dL — ABNORMAL LOW (ref 8.9–10.3)
Chloride: 104 mmol/L (ref 98–111)
Creatinine, Ser: 0.86 mg/dL (ref 0.44–1.00)
GFR, Estimated: >60 mL/min (ref >60)
Glucose, Bld: 99 mg/dL (ref 70–99)
Potassium: 3.3 mmol/L — ABNORMAL LOW (ref 3.5–5.1)
Sodium: 135 mmol/L (ref 135–145)
Total Bilirubin: 0.7 mg/dL (ref 0.3–1.2)
Total Protein: 8.1 g/dL (ref 6.5–8.1)

## 2023-06-30 LAB — RETICULOCYTES
Immature Retic Fract: 28.1 % — ABNORMAL HIGH (ref 2.3–15.9)
RBC.: 4.08 MIL/uL (ref 3.87–5.11)
Retic Count, Absolute: 145.7 10*3/uL (ref 19.0–186.0)
Retic Ct Pct: 3.6 % — ABNORMAL HIGH (ref 0.4–3.1)

## 2023-06-30 MED ORDER — ETODOLAC 400 MG PO TABS: 400.0000 mg | ORAL_TABLET | Freq: Two times a day (BID) | ORAL | 0 refills | Status: DC

## 2023-06-30 MED ORDER — ONDANSETRON HCL 4 MG/2ML IJ SOLN
4.0000 mg | Freq: Once | INTRAMUSCULAR | Status: AC
  Administered 2023-06-30: 4 mg via INTRAVENOUS
  Filled 2023-06-30: qty 2

## 2023-06-30 MED ORDER — HYDROMORPHONE HCL 1 MG/ML IJ SOLN
1.0000 mg | Freq: Once | INTRAMUSCULAR | Status: AC
  Administered 2023-06-30: 1 mg via INTRAVENOUS
  Filled 2023-06-30: qty 1

## 2023-06-30 MED ORDER — POTASSIUM CHLORIDE CRYS ER 20 MEQ PO TBCR
20.0000 meq | EXTENDED_RELEASE_TABLET | Freq: Once | ORAL | Status: AC
  Administered 2023-06-30: 20 meq via ORAL
  Filled 2023-06-30: qty 1

## 2023-06-30 NOTE — Discharge Instructions (Signed)
Your workup today was reassuring.  Pain improved after medications in the emergency department.  Lodine which is an anti-inflammatory medication was prescribed to you.  Do not take any other anti-inflammatory medications like ibuprofen or Aleve.  Follow-up with your primary care provider.

## 2023-07-03 ENCOUNTER — Emergency Department (HOSPITAL_COMMUNITY)
Admission: EM | Admit: 2023-07-03 | Discharge: 2023-07-04 | Disposition: A | Discharge status: Home / Self Care | Payer: MEDICAID | Attending: Emergency Medicine | Admitting: Emergency Medicine

## 2023-07-03 ENCOUNTER — Other Ambulatory Visit: Payer: Self-pay

## 2023-07-03 DIAGNOSIS — D5700 Hb-SS disease with crisis, unspecified: Secondary | ICD-10-CM | POA: Diagnosis not present

## 2023-07-03 DIAGNOSIS — Z91040 Latex allergy status: Secondary | ICD-10-CM | POA: Diagnosis not present

## 2023-07-03 DIAGNOSIS — M79604 Pain in right leg: Secondary | ICD-10-CM | POA: Diagnosis present

## 2023-07-03 DIAGNOSIS — Z5900 Homelessness unspecified: Secondary | ICD-10-CM | POA: Insufficient documentation

## 2023-07-03 MED ORDER — DIPHENHYDRAMINE HCL 25 MG PO CAPS
25.0000 mg | ORAL_CAPSULE | Freq: Four times a day (QID) | ORAL | Status: DC | PRN
  Administered 2023-07-03: 25 mg via ORAL
  Filled 2023-07-03: qty 1

## 2023-07-03 MED ORDER — HYDROMORPHONE HCL 1 MG/ML IJ SOLN
1.0000 mg | Freq: Once | INTRAMUSCULAR | Status: AC
  Administered 2023-07-04: 1 mg via SUBCUTANEOUS
  Filled 2023-07-03: qty 1

## 2023-07-03 MED ORDER — KETOROLAC TROMETHAMINE 15 MG/ML IJ SOLN
15.0000 mg | Freq: Once | INTRAMUSCULAR | Status: AC
  Administered 2023-07-04: 15 mg via INTRAVENOUS
  Filled 2023-07-03: qty 1

## 2023-07-03 NOTE — ED Triage Notes (Signed)
Pt BIB [facility] from [facility]. Pt c/o severe right leg pain that started a few hours ago. Pt took etodolac with no relief.   140/88 60HR 100% O2 RA 16RR

## 2023-07-04 LAB — COMPREHENSIVE METABOLIC PANEL
ALT: 17 U/L (ref 0–44)
AST: 17 U/L (ref 15–41)
Albumin: 3.6 g/dL (ref 3.5–5.0)
Alkaline Phosphatase: 57 U/L (ref 38–126)
Anion gap: 7 (ref 5–15)
BUN: 13 mg/dL (ref 6–20)
CO2: 23 mmol/L (ref 22–32)
Calcium: 8.5 mg/dL — ABNORMAL LOW (ref 8.9–10.3)
Chloride: 108 mmol/L (ref 98–111)
Creatinine, Ser: 0.86 mg/dL (ref 0.44–1.00)
GFR, Estimated: >60 mL/min (ref >60)
Glucose, Bld: 99 mg/dL (ref 70–99)
Potassium: 3.4 mmol/L — ABNORMAL LOW (ref 3.5–5.1)
Sodium: 138 mmol/L (ref 135–145)
Total Bilirubin: 0.9 mg/dL (ref 0.3–1.2)
Total Protein: 6.8 g/dL (ref 6.5–8.1)

## 2023-07-04 LAB — CBC WITH DIFFERENTIAL/PLATELET
Abs Immature Granulocytes: 0.04 10*3/uL (ref 0.00–0.07)
Basophils Absolute: 0.1 10*3/uL (ref 0.0–0.1)
Basophils Relative: 1 %
Eosinophils Absolute: 1.6 10*3/uL — ABNORMAL HIGH (ref 0.0–0.5)
Eosinophils Relative: 15 %
HCT: 32.9 % — ABNORMAL LOW (ref 36.0–46.0)
Hemoglobin: 12 g/dL (ref 12.0–15.0)
Immature Granulocytes: 0 %
Lymphocytes Relative: 38 %
Lymphs Abs: 4.2 10*3/uL — ABNORMAL HIGH (ref 0.7–4.0)
MCH: 28.4 pg (ref 26.0–34.0)
MCHC: 36.5 g/dL — ABNORMAL HIGH (ref 30.0–36.0)
MCV: 77.8 fL — ABNORMAL LOW (ref 80.0–100.0)
Monocytes Absolute: 0.8 10*3/uL (ref 0.1–1.0)
Monocytes Relative: 7 %
Neutro Abs: 4.4 10*3/uL (ref 1.7–7.7)
Neutrophils Relative %: 39 %
Platelets: 363 10*3/uL (ref 150–400)
RBC: 4.23 MIL/uL (ref 3.87–5.11)
RDW: 13.3 % (ref 11.5–15.5)
WBC: 11.3 10*3/uL — ABNORMAL HIGH (ref 4.0–10.5)
nRBC: 2.8 % — ABNORMAL HIGH (ref 0.0–0.2)

## 2023-07-04 LAB — RETICULOCYTES
Immature Retic Fract: 34.7 % — ABNORMAL HIGH (ref 2.3–15.9)
RBC.: 4.27 MIL/uL (ref 3.87–5.11)
Retic Count, Absolute: 155.4 10*3/uL (ref 19.0–186.0)
Retic Ct Pct: 3.6 % — ABNORMAL HIGH (ref 0.4–3.1)

## 2023-07-04 LAB — HCG, SERUM, QUALITATIVE: Preg, Serum: NEGATIVE

## 2023-07-04 MED ORDER — ONDANSETRON HCL 4 MG/2ML IJ SOLN
4.0000 mg | Freq: Once | INTRAMUSCULAR | Status: AC
  Administered 2023-07-04: 4 mg via INTRAVENOUS
  Filled 2023-07-04: qty 2

## 2023-07-04 MED ORDER — OXYCODONE-ACETAMINOPHEN 5-325 MG PO TABS
2.0000 | ORAL_TABLET | Freq: Once | ORAL | Status: AC
  Administered 2023-07-04: 2 via ORAL
  Filled 2023-07-04: qty 2

## 2023-07-04 MED ORDER — POTASSIUM CHLORIDE CRYS ER 20 MEQ PO TBCR
40.0000 meq | EXTENDED_RELEASE_TABLET | Freq: Once | ORAL | Status: DC
  Filled 2023-07-04: qty 2

## 2023-07-04 NOTE — Discharge Instructions (Addendum)
Your labs were reassuring today. Continue Etodolac for pain as previously prescribed. Follow up with the sickle cell center as scheduled.

## 2023-07-04 NOTE — ED Provider Notes (Signed)
[place] EMERGENCY DEPARTMENT AT [facility] Provider Note   CSN: [id number] Arrival date & time: 07/03/23  2200     History  Chief Complaint  Patient presents with   Sickle Cell Pain Crisis    Hannah Hawkins is a 37 y.o. female.  37 year old female presents to the emergency department from [facility] via EMS complaining of right lower extremity pain.  Pain started a few hours ago and she attributes her pain to an exacerbation of her sickle cell disease.  She took etodolac for pain without relief.  Was previously prescribed Percocet, but is in limbo with the sickle cell clinic.  Is scheduled to follow-up with them in August.  Had previously been discharged following a UDS positive for cocaine.  Denies any associated fever, chest pain, shortness of breath, color change to the extremity.  Reports that she has had similar pain in the past associated with sickle cell crisis.  The history is provided by the patient. No language interpreter was used.  Sickle Cell Pain Crisis      Home Medications Prior to Admission medications   Medication Sig Start Date End Date Taking? Authorizing Provider  etodolac (LODINE) 400 MG tablet Take 1 tablet (400 mg total) by mouth 2 (two) times daily. 06/30/23  Yes Ali, Amjad, PA-C  ibuprofen (ADVIL) 200 MG tablet Take 200 mg by mouth every 6 (six) hours as needed for fever, headache or mild pain.   Yes [provider]      Allergies    Latex and Penicillins    Review of Systems   Review of Systems Ten systems reviewed and are negative for acute change, except as noted in the HPI.    Physical Exam Updated Vital Signs BP (!) 139/99   Pulse 65   Temp 97.7 F (36.5 C)   Resp 16   SpO2 99%   Physical Exam Vitals and nursing note reviewed.  Constitutional:      General: She is not in acute distress.    Appearance: She is well-developed. She is not diaphoretic.     Comments: Nontoxic appearing and in NAD  HENT:      Head: Normocephalic and atraumatic.  Eyes:     General: No scleral icterus.    Extraocular Movements: EOM normal.     Conjunctiva/sclera: Conjunctivae normal.  Cardiovascular:     Rate and Rhythm: Normal rate and regular rhythm.     Pulses: Normal pulses.     Comments: DP pulse 2+ in the RLE Pulmonary:     Effort: Pulmonary effort is normal. No respiratory distress.     Comments: Respirations even and unlabored Musculoskeletal:        General: Normal range of motion.     Cervical back: Normal range of motion.     Comments: RLE is warm, well perfused. Normal ROM of the affected extremity. No crepitus or deformity.  Skin:    General: Skin is warm and dry.     Coloration: Skin is not pale.     Findings: No erythema or rash.  Neurological:     Mental Status: She is alert and oriented to person, place, and time.     Coordination: Coordination normal.  Psychiatric:        Mood and Affect: Mood and affect normal.        Behavior: Behavior normal.     ED Results / Procedures / Treatments   Labs (all labs ordered are listed, but only abnormal results  are displayed) Labs Reviewed  CBC WITH DIFFERENTIAL/PLATELET - Abnormal; Notable for the following components:      Result Value   WBC 11.3 (*)    HCT 32.9 (*)    MCV 77.8 (*)    MCHC 36.5 (*)    nRBC 2.8 (*)    Lymphs Abs 4.2 (*)    Eosinophils Absolute 1.6 (*)    All other components within normal limits  RETICULOCYTES - Abnormal; Notable for the following components:   Retic Ct Pct 3.6 (*)    Immature Retic Fract 34.7 (*)    All other components within normal limits  COMPREHENSIVE METABOLIC PANEL - Abnormal; Notable for the following components:   Potassium 3.4 (*)    Calcium 8.5 (*)    All other components within normal limits  HCG, SERUM, QUALITATIVE    EKG EKG Interpretation Date/Time:  Sunday July 03 2023 22:12:17 EDT Ventricular Rate:  63 PR Interval:  149 QRS Duration:  86 QT Interval:  407 QTC  Calculation: 417 R Axis:   65  Text Interpretation: Sinus rhythm Nonspecific ST abnormality Early repolarization Confirmed by Cathren Laine ([id number]) on 07/04/2023 2:34:04 AM  Radiology No results found.  Procedures Procedures    Medications Ordered in ED Medications  diphenhydrAMINE (BENADRYL) capsule 25-50 mg (25 mg Oral Given 07/03/23 2358)  potassium chloride SA (KLOR-CON M) CR tablet 40 mEq (40 mEq Oral Not Given 07/04/23 0227)  HYDROmorphone (DILAUDID) injection 1 mg (1 mg Subcutaneous Given 07/04/23 0006)  ketorolac (TORADOL) 15 MG/ML injection 15 mg (15 mg Intravenous Given 07/04/23 0004)  ondansetron (ZOFRAN) injection 4 mg (4 mg Intravenous Given 07/04/23 0002)  oxyCODONE-acetaminophen (PERCOCET/ROXICET) 5-325 MG per tablet 2 tablet (2 tablets Oral Given 07/04/23 0227)    ED Course/ Medical Decision Making/ A&P                             Medical Decision Making Amount and/or Complexity of Data Reviewed Labs: ordered.  Risk Prescription drug management.   This patient presents to the ED for concern of RLE pain, this involves an extensive number of treatment options, and is a complaint that carries with it a high risk of complications and morbidity.  The differential diagnosis includes strain/sprain vs fracture vs cellulitis vs arterial occlusion vs DVT   Co morbidities that complicate the patient evaluation  Sickle cell anemia   Additional history obtained:  Additional history obtained from EMS External records from outside source obtained and reviewed including baseline hemoglobin ~11 per chart review   Lab Tests:  I Ordered, and personally interpreted labs.  The pertinent results include:  Hgb 12.0. WBC 11.3 (stable). K 3.4   Cardiac Monitoring:  The patient was maintained on a cardiac monitor.  I personally viewed and interpreted the cardiac monitored which showed an underlying rhythm of: NSR   Medicines ordered and prescription drug management:  I  ordered medication including Toradol and Dilaudid for pain  Reevaluation of the patient after these medicines showed that the patient improved I have reviewed the patients home medicines and have made adjustments as needed   Test Considered:  Xray R femur   Problem List / ED Course:  Patient presenting to the emergency department for complaint of right lower extremity pain.  History of sickle cell anemia.  Hemoglobin is reassuring; actually normal today.  Labs do not suggest concern for aplastic crisis.  The patient has a warm, well-perfused extremity.  No  concern for venous thromboembolism or arterial occlusion.  Pain atraumatic in onset; doubt fracture.  She is neurovascularly intact on examination and ambulatory. Pain improved with Toradol and Dilaudid.  Given additional dose of Percocet for pain prior to discharge.  She is pending follow-up with the sickle cell center.   Reevaluation:  After the interventions noted above, I reevaluated the patient and found that they have :improved   Social Determinants of Health:  Homelessness    Dispostion:  After consideration of the diagnostic results and the patients response to treatment, I feel that the patent would benefit from outpatient supportive care. OK to continue OTC analgesics pending sickle cell f/u. Return precautions discussed and provided. Patient discharged in stable condition with no unaddressed concerns.          Final Clinical Impression(s) / ED Diagnoses Final diagnoses:  Sickle cell anemia with pain [facility])    Rx / DC Orders ED Discharge Orders     None         Antony Madura, PA-C 07/04/23 0251    Cathren Laine, MD 07/04/23 [phone]

## 2023-07-07 ENCOUNTER — Other Ambulatory Visit: Payer: Self-pay

## 2023-07-07 ENCOUNTER — Emergency Department (HOSPITAL_COMMUNITY)
Admission: EM | Admit: 2023-07-07 | Discharge: 2023-07-07 | Disposition: A | Discharge status: Home / Self Care | Payer: MEDICAID | Attending: Emergency Medicine | Admitting: Emergency Medicine

## 2023-07-07 DIAGNOSIS — D5700 Hb-SS disease with crisis, unspecified: Secondary | ICD-10-CM | POA: Diagnosis not present

## 2023-07-07 DIAGNOSIS — R52 Pain, unspecified: Secondary | ICD-10-CM | POA: Diagnosis present

## 2023-07-07 DIAGNOSIS — E871 Hypo-osmolality and hyponatremia: Secondary | ICD-10-CM

## 2023-07-07 DIAGNOSIS — Z91040 Latex allergy status: Secondary | ICD-10-CM | POA: Insufficient documentation

## 2023-07-07 LAB — CBC WITH DIFFERENTIAL/PLATELET
Abs Immature Granulocytes: 0.06 10*3/uL (ref 0.00–0.07)
Basophils Absolute: 0.1 10*3/uL (ref 0.0–0.1)
Basophils Relative: 1 %
Eosinophils Absolute: 1.2 10*3/uL — ABNORMAL HIGH (ref 0.0–0.5)
Eosinophils Relative: 9 %
HCT: 32 % — ABNORMAL LOW (ref 36.0–46.0)
Hemoglobin: 10.9 g/dL — ABNORMAL LOW (ref 12.0–15.0)
Immature Granulocytes: 0 %
Lymphocytes Relative: 29 %
Lymphs Abs: 3.9 10*3/uL (ref 0.7–4.0)
MCH: 27.9 pg (ref 26.0–34.0)
MCHC: 34.1 g/dL (ref 30.0–36.0)
MCV: 81.8 fL (ref 80.0–100.0)
Monocytes Absolute: 1 10*3/uL (ref 0.1–1.0)
Monocytes Relative: 7 %
Neutro Abs: 7.1 10*3/uL (ref 1.7–7.7)
Neutrophils Relative %: 54 %
Platelets: 369 10*3/uL (ref 150–400)
RBC: 3.91 MIL/uL (ref 3.87–5.11)
RDW: 13.7 % (ref 11.5–15.5)
WBC: 13.3 10*3/uL — ABNORMAL HIGH (ref 4.0–10.5)
nRBC: 2.2 % — ABNORMAL HIGH (ref 0.0–0.2)

## 2023-07-07 LAB — COMPREHENSIVE METABOLIC PANEL
ALT: 13 U/L (ref 0–44)
AST: 23 U/L (ref 15–41)
Albumin: 3.7 g/dL (ref 3.5–5.0)
Alkaline Phosphatase: 50 U/L (ref 38–126)
Anion gap: 5 (ref 5–15)
BUN: 10 mg/dL (ref 6–20)
CO2: 21 mmol/L — ABNORMAL LOW (ref 22–32)
Calcium: 8.3 mg/dL — ABNORMAL LOW (ref 8.9–10.3)
Chloride: 108 mmol/L (ref 98–111)
Creatinine, Ser: 0.77 mg/dL (ref 0.44–1.00)
GFR, Estimated: >60 mL/min (ref >60)
Glucose, Bld: 94 mg/dL (ref 70–99)
Potassium: 3.7 mmol/L (ref 3.5–5.1)
Sodium: 134 mmol/L — ABNORMAL LOW (ref 135–145)
Total Bilirubin: 0.7 mg/dL (ref 0.3–1.2)
Total Protein: 6.8 g/dL (ref 6.5–8.1)

## 2023-07-07 LAB — URINALYSIS, W/ REFLEX TO CULTURE (INFECTION SUSPECTED)
Bacteria, UA: NONE SEEN
Bilirubin Urine: NEGATIVE
Glucose, UA: NEGATIVE mg/dL
Ketones, ur: NEGATIVE mg/dL
Leukocytes,Ua: NEGATIVE
Nitrite: NEGATIVE
Protein, ur: NEGATIVE mg/dL
Specific Gravity, Urine: 1.006 (ref 1.005–1.030)
pH: 7 (ref 5.0–8.0)

## 2023-07-07 LAB — RETICULOCYTES
Immature Retic Fract: 35.7 % — ABNORMAL HIGH (ref 2.3–15.9)
RBC.: 3.71 MIL/uL — ABNORMAL LOW (ref 3.87–5.11)
Retic Count, Absolute: 167 10*3/uL (ref 19.0–186.0)
Retic Ct Pct: 4.5 % — ABNORMAL HIGH (ref 0.4–3.1)

## 2023-07-07 MED ORDER — OXYCODONE HCL 10 MG PO TABS: 10.0000 mg | ORAL_TABLET | ORAL | 0 refills | Status: DC | PRN

## 2023-07-07 MED ORDER — ONDANSETRON HCL 4 MG/2ML IJ SOLN: 4.0000 mg | INTRAMUSCULAR | Status: DC | PRN

## 2023-07-07 MED ORDER — SODIUM CHLORIDE 0.45 % IV SOLN: INTRAVENOUS | Status: DC

## 2023-07-07 MED ORDER — HYDROMORPHONE HCL 1 MG/ML IJ SOLN
1.0000 mg | INTRAMUSCULAR | Status: AC
  Administered 2023-07-07: 1 mg via INTRAVENOUS
  Filled 2023-07-07: qty 1

## 2023-07-07 MED ORDER — KETOROLAC TROMETHAMINE 15 MG/ML IJ SOLN
15.0000 mg | INTRAMUSCULAR | Status: AC
  Administered 2023-07-07: 15 mg via INTRAVENOUS
  Filled 2023-07-07: qty 1

## 2023-07-07 MED ORDER — OXYCODONE-ACETAMINOPHEN 5-325 MG PO TABS
2.0000 | ORAL_TABLET | Freq: Once | ORAL | Status: AC
  Administered 2023-07-07: 2 via ORAL
  Filled 2023-07-07: qty 2

## 2023-07-07 NOTE — ED Notes (Signed)
US PIV placed. 

## 2023-07-07 NOTE — ED Notes (Signed)
Will attempt US PIV 

## 2023-07-07 NOTE — Discharge Instructions (Addendum)
Take the etodolac twice a day, add acetaminophen as needed for additional pain relief.  If you still need additional pain relief, you may take the oxycodone.  Return to the emergency department if you develop fever or difficulty breathing.  Return to the emergency department if pain is not being adequately controlled.

## 2023-07-07 NOTE — ED Notes (Signed)
Patient difficult IV insertion. 2x RN attempt and unsuccessful. Dr. Preston Fleeting notified.

## 2023-07-07 NOTE — ED Triage Notes (Signed)
[facility] with sickle cell pain crisis. Pt reports pain all over and nausea.

## 2023-07-07 NOTE — ED Provider Notes (Signed)
[place] EMERGENCY DEPARTMENT AT [facility] Provider Note   CSN: [id number] Arrival date & time: 07/07/23  0113     History  Chief Complaint  Patient presents with   Sickle Cell Pain Crisis    Hannah Hawkins is a 37 y.o. female.  The history is provided by the patient.  Sickle Cell Pain Crisis She has history of sickle cell disease, chronic pain syndrome, cocaine use and comes in from a homeless shelter complaining of generalized pain which started tonight.  This is typical of her sickle cell pain.  Because she is staying in a shelter, she does not have any narcotic prescriptions.  She denies fever or chills.  She denies any urinary difficulty.  She denies nausea or vomiting.  She denies any sick contacts.  She denies cocaine use.   Home Medications Prior to Admission medications   Medication Sig Start Date End Date Taking? Authorizing Provider  etodolac (LODINE) 400 MG tablet Take 1 tablet (400 mg total) by mouth 2 (two) times daily. 06/30/23   Marita Kansas, PA-C  ibuprofen (ADVIL) 200 MG tablet Take 200 mg by mouth every 6 (six) hours as needed for fever, headache or mild pain.    [provider]      Allergies    Latex and Penicillins    Review of Systems   Review of Systems  All other systems reviewed and are negative.   Physical Exam Updated Vital Signs BP (!) 125/99 (BP Location: Left Arm)   Pulse 70   Temp 98.1 F (36.7 C) (Oral)   Resp 17   SpO2 100%  Physical Exam Vitals and nursing note reviewed.   37 year old female, resting comfortably and in no acute distress. Vital signs are significant for mildly elevated blood pressure. Oxygen saturation is 100%, which is normal. Head is normocephalic and atraumatic. PERRLA, EOMI. Oropharynx is clear. Neck is nontender and supple without adenopathy or JVD. Back is nontender and there is no CVA tenderness. Lungs are clear without rales, wheezes, or rhonchi. Chest is nontender. Heart has regular  rate and rhythm without murmur. Abdomen is soft, flat, nontender. Extremities have no cyanosis or edema, full range of motion is present. Skin is warm and dry without rash. Neurologic: Mental status is normal, cranial nerves are intact, moves all extremities equally.  ED Results / Procedures / Treatments   Labs (all labs ordered are listed, but only abnormal results are displayed) Labs Reviewed  CBC WITH DIFFERENTIAL/PLATELET - Abnormal; Notable for the following components:      Result Value   WBC 13.3 (*)    Hemoglobin 10.9 (*)    HCT 32.0 (*)    nRBC 2.2 (*)    Eosinophils Absolute 1.2 (*)    All other components within normal limits  COMPREHENSIVE METABOLIC PANEL - Abnormal; Notable for the following components:   Sodium 134 (*)    CO2 21 (*)    Calcium 8.3 (*)    All other components within normal limits  RETICULOCYTES - Abnormal; Notable for the following components:   Retic Ct Pct 4.5 (*)    RBC. 3.71 (*)    Immature Retic Fract 35.7 (*)    All other components within normal limits  URINALYSIS, W/ REFLEX TO CULTURE (INFECTION SUSPECTED) - Abnormal; Notable for the following components:   Color, Urine STRAW (*)    Hgb urine dipstick MODERATE (*)    All other components within normal limits  HCG, SERUM, QUALITATIVE  Procedures Procedures    Medications Ordered in ED Medications  0.45 % sodium chloride infusion ( Intravenous New Bag/Given 07/07/23 0337)  ondansetron (ZOFRAN) injection 4 mg (has no administration in time range)  oxyCODONE-acetaminophen (PERCOCET/ROXICET) 5-325 MG per tablet 2 tablet (has no administration in time range)  ketorolac (TORADOL) 15 MG/ML injection 15 mg (15 mg Intravenous Given 07/07/23 0324)  HYDROmorphone (DILAUDID) injection 1 mg (1 mg Intravenous Given 07/07/23 0323)  HYDROmorphone (DILAUDID) injection 1 mg (1 mg Intravenous Given 07/07/23 0355)  HYDROmorphone (DILAUDID) injection 1 mg (1 mg Intravenous Given 07/07/23 0435)    ED  Course/ Medical Decision Making/ A&P                             Medical Decision Making Amount and/or Complexity of Data Reviewed Labs: ordered.  Risk Prescription drug management.   Pain consistent with sickle cell crisis.  No evidence of any precipitating infection.  I have reviewed her past records, and she has frequent ED visits for sickle cell pain crises including 07/03/2023, 06/29/2023, 06/26/2023, 06/21/2023.  She has several admissions for sickle cell crises with most recent one on 04/12/2023.  I have ordered the sickle cell pain crisis order set including maintenance IV fluids, intravenous ketorolac, intravenous hydromorphone, intravenous ondansetron.  I reviewed her prescribing records on the [place] controlled substance reporting website and most recent narcotic prescription was for 20 oxycodone-acetaminophen tablets on her most recent discharge 10/13/2023.  She is reported to have an appointment with the sickle cell clinic, but not until next month.  I have reviewed and interpreted her laboratory test, and my interpretation is mild leukocytosis which is nonspecific, borderline hyponatremia which is not felt to be clinically significant, adequate reticulocyte count, normal urinalysis.  She had good relief of pain with above-noted treatment and felt that she was at a point where she could safely go home but is requesting some pain medication for use at home.  She had received a prescription for etodolac but had not started it.  I have advised her to start taking that today and I am giving her prescription for a small number of oxycodone tablets.  She is to use acetaminophen as needed for additional pain relief.  She is to keep her appointment with the sickle cell clinic.  Return precautions discussed.  Final Clinical Impression(s) / ED Diagnoses Final diagnoses:  Sickle cell pain crisis (HCC)  Hyponatremia    Rx / DC Orders ED Discharge Orders          Ordered    oxyCODONE 10 MG  TABS  Every 4 hours PRN,   Status:  Discontinued        07/07/23 0654    Oxycodone HCl 10 MG TABS  Every 4 hours PRN        07/07/23 0654              Dione Booze, MD 07/07/23 [phone]

## 2023-07-10 ENCOUNTER — Other Ambulatory Visit: Payer: Self-pay

## 2023-07-10 ENCOUNTER — Encounter (HOSPITAL_COMMUNITY): Payer: Self-pay

## 2023-07-10 ENCOUNTER — Emergency Department (HOSPITAL_COMMUNITY)
Admission: EM | Admit: 2023-07-10 | Discharge: 2023-07-11 | Disposition: A | Discharge status: Home / Self Care | Payer: MEDICAID | Attending: Emergency Medicine | Admitting: Emergency Medicine

## 2023-07-10 DIAGNOSIS — Z87891 Personal history of nicotine dependence: Secondary | ICD-10-CM | POA: Insufficient documentation

## 2023-07-10 DIAGNOSIS — D5700 Hb-SS disease with crisis, unspecified: Secondary | ICD-10-CM | POA: Insufficient documentation

## 2023-07-10 NOTE — ED Triage Notes (Signed)
Pt brought in by [facility] EMS for sickle cell crisis. Pt states that she has generalized body pain and bilateral lower leg and ankle swelling. Symptoms began to worsen at approx 1800 tonight.

## 2023-07-11 LAB — CBC WITH DIFFERENTIAL/PLATELET
Abs Immature Granulocytes: 0.05 10*3/uL (ref 0.00–0.07)
Basophils Absolute: 0.1 10*3/uL (ref 0.0–0.1)
Basophils Relative: 1 %
Eosinophils Absolute: 1.2 10*3/uL — ABNORMAL HIGH (ref 0.0–0.5)
Eosinophils Relative: 10 %
HCT: 30.8 % — ABNORMAL LOW (ref 36.0–46.0)
Hemoglobin: 11.2 g/dL — ABNORMAL LOW (ref 12.0–15.0)
Immature Granulocytes: 0 %
Lymphocytes Relative: 28 %
Lymphs Abs: 3.4 10*3/uL (ref 0.7–4.0)
MCH: 27.7 pg (ref 26.0–34.0)
MCHC: 36.4 g/dL — ABNORMAL HIGH (ref 30.0–36.0)
MCV: 76.2 fL — ABNORMAL LOW (ref 80.0–100.0)
Monocytes Absolute: 1.1 10*3/uL — ABNORMAL HIGH (ref 0.1–1.0)
Monocytes Relative: 9 %
Neutro Abs: 6.1 10*3/uL (ref 1.7–7.7)
Neutrophils Relative %: 52 %
Platelets: 374 10*3/uL (ref 150–400)
RBC: 4.04 MIL/uL (ref 3.87–5.11)
RDW: 13.3 % (ref 11.5–15.5)
WBC: 11.9 10*3/uL — ABNORMAL HIGH (ref 4.0–10.5)
nRBC: 2.3 % — ABNORMAL HIGH (ref 0.0–0.2)

## 2023-07-11 LAB — COMPREHENSIVE METABOLIC PANEL
ALT: 28 U/L (ref 0–44)
AST: 30 U/L (ref 15–41)
Albumin: 4.3 g/dL (ref 3.5–5.0)
Alkaline Phosphatase: 58 U/L (ref 38–126)
Anion gap: 6 (ref 5–15)
BUN: 7 mg/dL (ref 6–20)
CO2: 26 mmol/L (ref 22–32)
Calcium: 9 mg/dL (ref 8.9–10.3)
Chloride: 105 mmol/L (ref 98–111)
Creatinine, Ser: 0.78 mg/dL (ref 0.44–1.00)
GFR, Estimated: >60 mL/min (ref >60)
Glucose, Bld: 102 mg/dL — ABNORMAL HIGH (ref 70–99)
Potassium: 3.4 mmol/L — ABNORMAL LOW (ref 3.5–5.1)
Sodium: 137 mmol/L (ref 135–145)
Total Bilirubin: 1.2 mg/dL (ref 0.3–1.2)
Total Protein: 7.9 g/dL (ref 6.5–8.1)

## 2023-07-11 LAB — HCG, SERUM, QUALITATIVE: Preg, Serum: NEGATIVE

## 2023-07-11 MED ORDER — HYDROMORPHONE HCL 2 MG/ML IJ SOLN
2.0000 mg | INTRAMUSCULAR | Status: AC
  Administered 2023-07-11: 2 mg via INTRAVENOUS
  Filled 2023-07-11: qty 1

## 2023-07-11 MED ORDER — KETOROLAC TROMETHAMINE 15 MG/ML IJ SOLN
15.0000 mg | INTRAMUSCULAR | Status: AC
  Administered 2023-07-11: 15 mg via INTRAVENOUS
  Filled 2023-07-11: qty 1

## 2023-07-11 MED ORDER — ONDANSETRON HCL 4 MG/2ML IJ SOLN
4.0000 mg | INTRAMUSCULAR | Status: DC | PRN
  Administered 2023-07-11: 4 mg via INTRAVENOUS
  Filled 2023-07-11: qty 2

## 2023-07-11 MED ORDER — DIPHENHYDRAMINE HCL 25 MG PO CAPS: 25.0000 mg | ORAL_CAPSULE | ORAL | Status: DC | PRN

## 2023-07-11 MED ORDER — OXYCODONE-ACETAMINOPHEN 10-325 MG PO TABS: 1.0000 | ORAL_TABLET | ORAL | 0 refills | Status: DC | PRN

## 2023-07-11 NOTE — ED Provider Notes (Signed)
[facility]-EMERGENCY DEPT [facility] Emergency Department Provider Note MRN:  [id number]  Arrival date & time: 07/11/23     Chief Complaint   Sickle Cell Pain Crisis   History of Present Illness   Hannah Hawkins is a 37 y.o. year-old female with a history of sickle cell anemia presenting to the ED with chief complaint of sickle cell pain crisis.  Pain to the arms and legs and back, started today.  Thinks maybe it was triggered by ambulating too much today.  Patient is homeless.  Had some abdominal pain earlier but not currently.  Denies any chest pain or shortness of breath.  No fever today.  Review of Systems  A thorough review of systems was obtained and all systems are negative except as noted in the HPI and PMH.   Patient's Health History    Past Medical History:  Diagnosis Date   Sickle cell anemia (HCC)     Past Surgical History:  Procedure Laterality Date   CHOLECYSTECTOMY N/A 06/03/2021   Procedure: LAPAROSCOPIC CHOLECYSTECTOMY;  Surgeon: Abigail Miyamoto, MD;  Location: [facility] ORS;  Service: General;  Laterality: N/A;   EYE SURGERY  03/09/2017   left eye   EYE SURGERY Right 09/2019   right hemerage     Family History  Problem Relation Age of Onset   Diabetes Father     Social History   Socioeconomic History   Marital status: Single    Spouse name: Not on file   Number of children: Not on file   Years of education: Not on file   Highest education level: Not on file  Occupational History   Not on file  Tobacco Use   Smoking status: Former    Types: Cigarettes   Smokeless tobacco: Never  Vaping Use   Vaping status: Some Days   Substances: Flavoring  Substance and Sexual Activity   Alcohol use: Yes   Drug use: Not Currently    Types: Marijuana   Sexual activity: Yes  Other Topics Concern   Not on file  Social History Narrative   Not on file   Social Determinants of Health   Financial Resource Strain: Low Risk  (02/16/2022)   Received from [facility]   Overall Financial Resource Strain (CARDIA)    Difficulty of Paying Living Expenses: Not hard at all  Food Insecurity: No Food Insecurity (04/12/2023)   Hunger Vital Sign    Worried About Running Out of Food in the Last Year: Never true    Ran Out of Food in the Last Year: Never true  Transportation Needs: Unmet Transportation Needs (04/12/2023)   PRAPARE - Transportation    Lack of Transportation (Medical): Yes    Lack of Transportation (Non-Medical): Yes  Physical Activity: Not on file  Stress: Stress Concern Present (02/16/2022)   Received from [facility] - Occupational Stress Questionnaire    Feeling of Stress : Very much  Social Connections: Unknown (04/19/2022)   Received from [facility]   Social Network    Social Network: Not on file  Intimate Partner Violence: Not At Risk (04/12/2023)   Humiliation, Afraid, Rape, and Kick questionnaire    Fear of Current or Ex-Partner: No    Emotionally Abused: No    Physically Abused: No    Sexually Abused: No     Physical Exam   Vitals:   07/11/23 0146 07/11/23 0200  BP:  (!) 142/124  Pulse:  60  Resp: 15 17  Temp:    SpO2:  91%    CONSTITUTIONAL: Well-appearing, NAD NEURO/PSYCH:  Alert and oriented x 3, no focal deficits EYES:  eyes equal and reactive ENT/NECK:  no LAD, no JVD CARDIO: Regular rate, well-perfused, normal S1 and S2 PULM:  CTAB no wheezing or rhonchi GI/GU:  non-distended, non-tender MSK/SPINE:  No gross deformities, no edema SKIN:  no rash, atraumatic   *Additional and/or pertinent findings included in MDM below  Diagnostic and Interventional Summary    EKG Interpretation Date/Time:    Ventricular Rate:    PR Interval:    QRS Duration:    QT Interval:    QTC Calculation:   R Axis:      Text Interpretation:         Labs Reviewed  COMPREHENSIVE METABOLIC PANEL - Abnormal; Notable for the following components:      Result Value   Potassium  3.4 (*)    Glucose, Bld 102 (*)    All other components within normal limits  CBC WITH DIFFERENTIAL/PLATELET - Abnormal; Notable for the following components:   WBC 11.9 (*)    Hemoglobin 11.2 (*)    HCT 30.8 (*)    MCV 76.2 (*)    MCHC 36.4 (*)    nRBC 2.3 (*)    Monocytes Absolute 1.1 (*)    Eosinophils Absolute 1.2 (*)    All other components within normal limits  HCG, SERUM, QUALITATIVE  RETICULOCYTES    No orders to display    Medications  diphenhydrAMINE (BENADRYL) capsule 25-50 mg (has no administration in time range)  ondansetron (ZOFRAN) injection 4 mg (4 mg Intravenous Given 07/11/23 0044)  ketorolac (TORADOL) 15 MG/ML injection 15 mg (15 mg Intravenous Given 07/11/23 0043)  HYDROmorphone (DILAUDID) injection 2 mg (2 mg Intravenous Given 07/11/23 0107)  HYDROmorphone (DILAUDID) injection 2 mg (2 mg Intravenous Given 07/11/23 0149)  HYDROmorphone (DILAUDID) injection 2 mg (2 mg Intravenous Given 07/11/23 0235)     Procedures  /  Critical Care Procedures  ED Course and Medical Decision Making  Initial Impression and Ddx Question chronic pain presentation versus sickle cell pain crisis.  Homeless and has a history of having issues getting prescription medications.  No fever, no chest pain or shortness of breath, overall well-appearing with reassuring vital signs, soft abdomen.  Starting pain control, will reassess.  Past medical/surgical history that increases complexity of ED encounter: Sickle cell anemia  Interpretation of Diagnostics I personally reviewed the laboratory assessment and my interpretation is as follows: No significant blood count or electrolyte disturbance    Patient Reassessment and Ultimate Disposition/Management     Patient feeling a lot better, vital signs normal, appropriate for discharge.  Patient management required discussion with the following services or consulting groups:  None  Complexity of Problems Addressed Acute illness or injury  that poses threat of life of bodily function  Additional Data Reviewed and Analyzed Further history obtained from: Prior labs/imaging results  Additional Factors Impacting ED Encounter Risk Use of parenteral controlled substances  Elmer Sow. Pilar Plate, MD [facility] Emergency Medicine [facility] [email or web address]  Final Clinical Impressions(s) / ED Diagnoses     ICD-10-CM   1. Sickle cell pain crisis (HCC)  D57.00       ED Discharge Orders          Ordered    oxyCODONE-acetaminophen (PERCOCET) 10-325 MG tablet  Every 4 hours PRN        07/11/23 0250  Discharge Instructions Discussed with and Provided to Patient:     Discharge Instructions      You were evaluated in the Emergency Department and after careful evaluation, we did not find any emergent condition requiring admission or further testing in the hospital.  Your exam/testing today is overall reassuring.  Important that you follow-up with a hematologist for your sickle cell management.  Use the oxycodone medication as needed for pain.  Please return to the Emergency Department if you experience any worsening of your condition.   Thank you for allowing [place] to be a part of your care.       Sabas Sous, MD 07/11/23 [phone]

## 2023-07-11 NOTE — Discharge Instructions (Signed)
You were evaluated in the Emergency Department and after careful evaluation, we did not find any emergent condition requiring admission or further testing in the hospital.  Your exam/testing today is overall reassuring.  Important that you follow-up with a hematologist for your sickle cell management.  Use the oxycodone medication as needed for pain.  Please return to the Emergency Department if you experience any worsening of your condition.   Thank you for allowing [place] to be a part of your care.

## 2023-07-13 NOTE — Congregational Nurse Program (Signed)
  Dept: [phone]   Congregational Nurse Program Note  Date of Encounter: 07/12/2023  Conference with resident and [facility] case Sales executive regarding resident's frequent visits to the emergency room that are disrupting staff schedules when she returns to the facility late at night or doesn't leave from the dormitory area at designated times.  Discussed with resident need to manage her chronic condition in a matter to limit trips to ER for pain medication. Telephone call to resident at 5:00PM to request that she discuss with PCP on Friday possibility of a regular medication to control pain from the sickle disease. Past Medical History: Past Medical History:  Diagnosis Date   Sickle cell anemia (HCC)     Encounter Details:  CNP Questionnaire - 07/12/23 1000       Questionnaire   Ask client: Do you give verbal consent for me to treat you today? Yes    Student Assistance N/A    Location Patient Served  [facility]    Visit Setting with Client Organization;Phone/Text/Email    Patient Status Unhoused    Insurance Medicaid    Insurance/Financial Assistance Referral N/A    Medication Have Medication Insecurities    Medical Provider Yes    Screening Referrals Made N/A    Medical Referrals Made N/A    Medical Appointment Made N/A    Recently w/o PCP, now 1st time PCP visit completed due to CNs referral or appointment made N/A    Food Have Food Insecurities    Transportation N/A    Housing/Utilities No permanent housing    Interpersonal Safety Do not feel safe at current residence    Interventions Advocate/Support;Navigate Healthcare System;Counsel;Reviewed Medications;Educate    Abnormal to Normal Screening Since Last CN Visit N/A    Screenings CN Performed N/A    Sent Client to Lab for: N/A    Did client attend any of the following based off CNs referral or appointments made? N/A    ED Visit Averted N/A    Life-Saving Intervention Made N/A

## 2023-07-15 ENCOUNTER — Encounter: Payer: Self-pay | Admitting: Nurse Practitioner

## 2023-07-15 ENCOUNTER — Ambulatory Visit (INDEPENDENT_AMBULATORY_CARE_PROVIDER_SITE_OTHER): Payer: MEDICAID | Admitting: Nurse Practitioner

## 2023-07-15 VITALS — BP 128/67 | HR 73 | Temp 97.4°F | Ht 63.0 in | Wt 162.6 lb

## 2023-07-15 DIAGNOSIS — D5720 Sickle-cell/Hb-C disease without crisis: Secondary | ICD-10-CM

## 2023-07-15 MED ORDER — GABAPENTIN 300 MG PO CAPS: 300.0000 mg | ORAL_CAPSULE | Freq: Three times a day (TID) | ORAL | 3 refills | Status: DC

## 2023-07-15 NOTE — Assessment & Plan Note (Signed)
Start gabapentin 300 mg 3 times daily Follow-up with [place] NP to discuss starting Suboxone. Need to maintain hydration and avoid triggers of infection discussed

## 2023-07-15 NOTE — Patient Instructions (Addendum)
1. Sickle cell-hemoglobin C disease without crisis (HCC)  - gabapentin (NEURONTIN) 300 MG capsule; Take 1 capsule (300 mg total) by mouth 3 (three) times daily.  Dispense: 90 capsule; Refill: 3 .    Please maintain good hydration,  avoid heat, cold, stress, and infection triggers. Please Seek medical attention immediately if any symptom of bleeding, anemia, or infection occurs.      Thanks for choosing [facility] we consider it a privelige to serve you.

## 2023-07-15 NOTE — Progress Notes (Signed)
Established Patient Office Visit  Subjective:  Patient ID: Hannah Hawkins, female    DOB: April 18, 1986  Age: 37 y.o. MRN: [id number]  CC:  Chief Complaint  Patient presents with   Follow-up    HPI Hannah Hawkins is a 37 y.o. female  has a past medical history of Sickle cell anemia (HCC).  Patient presents for ER follow-up visit .  Patient has been to the emergency department frequently with complaints of sickle cell pain crisis.  PCP has not been prescribing opioids due to her history of substance abuse.  She is not interested in starting disease modifying agents not interested in taking folic acid.  I discussed referral to hematology but the patient declined.  I discussed starting gabapentin or Cymbalta for her chronic pain. she plans to follow-up with [place] NP and agrees to start gabapentin for her chronic pain.  Stated that she was on gabapentin 100 mg 3 times daily in the past it did not help her pain.  initially stated that she has pain rated 7/10 on her right leg and right arm but later said that the pain was generalized.  No fever, chills, cough, shortness of breath, wheezing abdominal pain, nausea, vomiting.    Past Medical History:  Diagnosis Date   Sickle cell anemia (HCC)     Past Surgical History:  Procedure Laterality Date   CHOLECYSTECTOMY N/A 06/03/2021   Procedure: LAPAROSCOPIC CHOLECYSTECTOMY;  Surgeon: Abigail Miyamoto, MD;  Location: [facility];  Service: General;  Laterality: N/A;   EYE SURGERY  03/09/2017   left eye   EYE SURGERY Right 09/2019   right hemerage     Family History  Problem Relation Age of Onset   Diabetes Father     Social History   Socioeconomic History   Marital status: Single    Spouse name: Not on file   Number of children: Not on file   Years of education: Not on file   Highest education level: Not on file  Occupational History   Not on file  Tobacco Use   Smoking status: Former    Types: Cigarettes   Smokeless tobacco: Never   Vaping Use   Vaping status: Some Days   Substances: Flavoring  Substance and Sexual Activity   Alcohol use: Yes   Drug use: Not Currently    Types: Marijuana   Sexual activity: Yes  Other Topics Concern   Not on file  Social History Narrative   Not on file   Social Determinants of Health   Financial Resource Strain: Low Risk  (02/16/2022)   [facility]   Overall Financial Resource Strain (CARDIA)    Difficulty of Paying Living Expenses: Not hard at all  Food Insecurity: No Food Insecurity (04/12/2023)   Hunger Vital Sign    Worried About Running Out of Food in the Last Year: Never true    Ran Out of Food in the Last Year: Never true  Transportation Needs: Unmet Transportation Needs (04/12/2023)   PRAPARE - Transportation    Lack of Transportation (Medical): Yes    Lack of Transportation (Non-Medical): Yes  Physical Activity: Not on file  Stress: Stress Concern Present (02/16/2022)   Received from [facility] - Occupational Stress Questionnaire    Feeling of Stress : Very much  Social Connections: Unknown (04/19/2022)   Received from [employer]   Social Network    Social Network: Not on file  Intimate Partner Violence: Not At  Risk (04/12/2023)   Humiliation, Afraid, Rape, and Kick questionnaire    Fear of Current or Ex-Partner: No    Emotionally Abused: No    Physically Abused: No    Sexually Abused: No    Outpatient Medications Prior to Visit  Medication Sig Dispense Refill   oxyCODONE-acetaminophen (PERCOCET) 10-325 MG tablet Take 1 tablet by mouth every 4 (four) hours as needed for pain. 15 tablet 0   etodolac (LODINE) 400 MG tablet Take 1 tablet (400 mg total) by mouth 2 (two) times daily. (Patient not taking: Reported on 07/15/2023) 14 tablet 0   ibuprofen (ADVIL) 200 MG tablet Take 200 mg by mouth every 6 (six) hours as needed for fever, headache or mild pain. (Patient not taking: Reported on 07/15/2023)      Oxycodone HCl 10 MG TABS Take 1 tablet (10 mg total) by mouth every 4 (four) hours as needed. (Patient not taking: Reported on 07/15/2023) 12 tablet 0   No facility-administered medications prior to visit.    Allergies  Allergen Reactions   Latex Hives and Rash   Penicillins Swelling and Rash    Has patient had a PCN reaction causing immediate rash, facial/tongue/throat swelling, SOB or lightheadedness with hypotension: Yes Has patient had a PCN reaction causing severe rash involving mucus membranes or skin necrosis: Yes Has patient had a PCN reaction that required hospitalization No Has patient had a PCN reaction occurring within the last 10 years: no If all of the above answers are "NO", then may proceed with Cephalosporin use.     ROS Review of Systems  Constitutional:  Negative for activity change, appetite change, chills, fatigue and fever.  HENT:  Negative for congestion, dental problem, ear discharge, ear pain, hearing loss, rhinorrhea, sinus pressure, sinus pain, sneezing and sore throat.   Eyes:  Negative for pain, discharge, redness and itching.  Respiratory:  Negative for cough, chest tightness, shortness of breath and wheezing.   Cardiovascular:  Negative for chest pain, palpitations and leg swelling.  Gastrointestinal:  Negative for abdominal distention, abdominal pain, anal bleeding, blood in stool, constipation, diarrhea, nausea, rectal pain and vomiting.  Endocrine: Negative for cold intolerance, heat intolerance, polydipsia, polyphagia and polyuria.  Genitourinary:  Negative for difficulty urinating, dysuria, flank pain, frequency, hematuria, menstrual problem, pelvic pain and vaginal bleeding.  Musculoskeletal:  Positive for arthralgias. Negative for back pain, gait problem, joint swelling and myalgias.       Chronic sickle cell pain  Skin:  Negative for color change, pallor, rash and wound.  Allergic/Immunologic: Negative for environmental allergies, food allergies  and immunocompromised state.  Neurological:  Negative for dizziness, tremors, facial asymmetry, weakness and headaches.  Hematological:  Negative for adenopathy. Does not bruise/bleed easily.  Psychiatric/Behavioral:  Negative for agitation, behavioral problems, confusion, decreased concentration, hallucinations, self-injury and suicidal ideas.       Objective:    Physical Exam Vitals and nursing note reviewed.  Constitutional:      General: She is not in acute distress.    Appearance: Normal appearance. She is not ill-appearing, toxic-appearing or diaphoretic.  HENT:     Mouth/Throat:     Mouth: Mucous membranes are moist.     Pharynx: Oropharynx is clear. No oropharyngeal exudate or posterior oropharyngeal erythema.  Eyes:     General:        Right eye: No discharge.        Left eye: No discharge.     Extraocular Movements: Extraocular movements intact.  Conjunctiva/sclera: Conjunctivae normal.  Cardiovascular:     Rate and Rhythm: Normal rate and regular rhythm.     Pulses: Normal pulses.     Heart sounds: Normal heart sounds. No murmur heard.    No friction rub. No gallop.  Pulmonary:     Effort: Pulmonary effort is normal. No respiratory distress.     Breath sounds: Normal breath sounds. No stridor. No wheezing, rhonchi or rales.  Chest:     Chest wall: No tenderness.  Abdominal:     General: There is no distension.     Palpations: Abdomen is soft.     Tenderness: There is no abdominal tenderness. There is no right CVA tenderness, left CVA tenderness or guarding.  Musculoskeletal:        General: No swelling, tenderness, deformity or signs of injury.  Skin:    General: Skin is warm and dry.     Capillary Refill: Capillary refill takes less than 2 seconds.     Coloration: Skin is not jaundiced or pale.     Findings: No bruising, erythema or lesion.  Neurological:     Mental Status: She is alert and oriented to person, place, and time.     Motor: No weakness.      Coordination: Coordination normal.     Gait: Gait normal.  Psychiatric:        Mood and Affect: Mood normal.        Behavior: Behavior normal.        Thought Content: Thought content normal.        Judgment: Judgment normal.     BP 128/67   Pulse 73   Temp (!) 97.4 F (36.3 C)   Ht 5\' 3"  (1.6 m)   Wt 162 lb 9.6 oz (73.8 kg)   LMP 07/08/2023 (Approximate)   SpO2 100%   BMI 28.80 kg/m  Wt Readings from Last 3 Encounters:  07/15/23 162 lb 9.6 oz (73.8 kg)  07/10/23 165 lb (74.8 kg)  06/29/23 163 lb (73.9 kg)    Lab Results  Component Value Date   TSH 1.940 07/24/2020   Lab Results  Component Value Date   WBC 11.9 (H) 07/11/2023   HGB 11.2 (L) 07/11/2023   HCT 30.8 (L) 07/11/2023   MCV 76.2 (L) 07/11/2023   PLT 374 07/11/2023   Lab Results  Component Value Date   NA 137 07/11/2023   K 3.4 (L) 07/11/2023   CO2 26 07/11/2023   GLUCOSE 102 (H) 07/11/2023   BUN 7 07/11/2023   CREATININE 0.78 07/11/2023   BILITOT 1.2 07/11/2023   ALKPHOS 58 07/11/2023   AST 30 07/11/2023   ALT 28 07/11/2023   PROT 7.9 07/11/2023   ALBUMIN 4.3 07/11/2023   CALCIUM 9.0 07/11/2023   ANIONGAP 6 07/11/2023   EGFR 80 05/06/2023   Lab Results  Component Value Date   CHOL 113 08/07/2020   Lab Results  Component Value Date   HDL 38 (L) 08/07/2020   Lab Results  Component Value Date   LDLCALC 65 08/07/2020   Lab Results  Component Value Date   TRIG 52 08/07/2020   Lab Results  Component Value Date   CHOLHDL 3.0 08/07/2020   No results found for: "HGBA1C"    Assessment & Plan:   Problem List Items Addressed This Visit       Other   Sickle-cell/Hb-C disease (HCC) - Primary (Chronic)    Start gabapentin 300 mg 3 times daily Follow-up with [place] NP  to discuss starting Suboxone. Need to maintain hydration and avoid triggers of infection discussed      Relevant Medications   gabapentin (NEURONTIN) 300 MG capsule    Meds ordered this encounter  Medications    gabapentin (NEURONTIN) 300 MG capsule    Sig: Take 1 capsule (300 mg total) by mouth 3 (three) times daily.    Dispense:  90 capsule    Refill:  3    Follow-up: Return in about 4 weeks (around 08/12/2023).    Donell Beers, FNP

## 2023-07-19 ENCOUNTER — Ambulatory Visit: Payer: Self-pay | Admitting: Family Medicine

## 2023-08-09 ENCOUNTER — Ambulatory Visit: Payer: Self-pay | Admitting: Family Medicine

## 2023-08-12 ENCOUNTER — Ambulatory Visit: Payer: Self-pay | Admitting: Nurse Practitioner

## 2023-08-25 ENCOUNTER — Ambulatory Visit: Payer: Medicaid Other | Admitting: Gastroenterology

## 2023-09-05 ENCOUNTER — Telehealth: Payer: Self-pay

## 2023-09-05 NOTE — Transitions of Care (Post Inpatient/ED Visit) (Unsigned)
09/05/2023  Name: Hannah Hawkins MRN: [id number] DOB: 08/31/1986  Today's TOC FU Call Status: Today's TOC FU Call Status:: Unsuccessful Call (1st Attempt) Unsuccessful Call (1st Attempt) Date: 09/05/23  Attempted to reach the patient regarding the most recent Inpatient/ED visit.  Follow Up Plan: Additional outreach attempts will be made to reach the patient to complete the Transitions of Care (Post Inpatient/ED visit) call.   Signature Karena Addison, LPN [facility] Nurse Health Advisor Direct Dial [phone]

## 2023-09-06 NOTE — Transitions of Care (Post Inpatient/ED Visit) (Signed)
09/06/2023  Name: Jaquayla Devereux MRN: [id number] DOB: 1986/12/18  Today's TOC FU Call Status: Today's TOC FU Call Status:: Successful TOC FU Call Completed Unsuccessful Call (1st Attempt) Date: 09/05/23 [facility] FU Call Complete Date: 09/06/23 Patient's Name and Date of Birth confirmed.  Transition Care Management Follow-up Telephone Call Date of Discharge: 09/02/23 Discharge Facility: Other [facility]) Name of Other (Non[facility]) Discharge Facility: [facility] Type of Discharge: Inpatient Admission Primary Inpatient Discharge Diagnosis:: HbSS How have you been since you were released from the hospital?: Better Any questions or concerns?: No  Items Reviewed: Did you receive and understand the discharge instructions provided?: Yes Medications obtained,verified, and reconciled?: Yes (Medications Reviewed) Any new allergies since your discharge?: No Dietary orders reviewed?: Yes Do you have support at home?: Yes People in Home: child(ren), dependent, friend(s)  Medications Reviewed Today: Medications Reviewed Today     Reviewed by Karena Addison, LPN (Licensed Practical Nurse) on 09/06/23 at 1714  Med List Status: <None>   Medication Order Taking? Sig Documenting Provider Last Dose Status Informant  etodolac (LODINE) 400 MG tablet [id number] No Take 1 tablet (400 mg total) by mouth 2 (two) times daily.  Patient not taking: Reported on 07/15/2023   Marita [place], PA-C Not Taking Active Self           Med Note Cyndie Chime, Merlyn Lot Jul 10, 2023 11:42 PM) Still Hasn't started  gabapentin (NEURONTIN) 300 MG capsule [id number]  Take 1 capsule (300 mg total) by mouth 3 (three) times daily. Donell Beers, FNP  Active   ibuprofen (ADVIL) 200 MG tablet [id number] No Take 200 mg by mouth every 6 (six) hours as needed for fever, headache or mild pain.  Patient not taking: Reported on 07/15/2023   [provider] Not Taking Active Self  Oxycodone HCl 10 MG TABS [id number] No  Take 1 tablet (10 mg total) by mouth every 4 (four) hours as needed.  Patient not taking: Reported on 07/15/2023   Dione Booze, MD Not Taking Active Self  oxyCODONE-acetaminophen (PERCOCET) 10-325 MG tablet [id number] No Take 1 tablet by mouth every 4 (four) hours as needed for pain. Sabas Sous, MD Taking Active             Home Care and Equipment/Supplies: Were Home Health Services Ordered?: NA Any new equipment or medical supplies ordered?: NA  Functional Questionnaire: Do you need assistance with bathing/showering or dressing?: No Do you need assistance with meal preparation?: No Do you need assistance with eating?: No Do you have difficulty maintaining continence: No Do you need assistance with getting out of bed/getting out of a chair/moving?: No Do you have difficulty managing or taking your medications?: No  Follow up appointments reviewed: PCP Follow-up appointment confirmed?: No (will call back to schedule) MD Provider Line [phone] Given: No Specialist Hospital Follow-up appointment confirmed?: NA Do you need transportation to your follow-up appointment?: No Do you understand care options if your condition(s) worsen?: Yes-patient verbalized understanding    SIGNATURE Karena Addison, LPN [facility] Nurse Health Advisor Direct Dial [phone]

## 2023-09-19 ENCOUNTER — Telehealth: Payer: Self-pay | Admitting: *Deleted

## 2023-09-19 NOTE — Transitions of Care (Post Inpatient/ED Visit) (Signed)
09/19/2023  Name: Hannah Hawkins MRN: [id number] DOB: March 08, 1986  Today's TOC FU Call Status: Today's TOC FU Call Status:: Unsuccessful Call (1st Attempt) Unsuccessful Call (1st Attempt) Date: 09/19/23  Attempted to reach the patient regarding the most recent Inpatient visit;  Received automated outgoing message stating, "Sorry, the number you have requested to call cannot be dialed;" no option to leave voice message requesting call-back   Follow Up Plan: Additional outreach attempts will be made to reach the patient to complete the Transitions of Care (Post Inpatient visit) call.   Caryl Pina, RN, BSN, CCRN Alumnus RN CM Care Coordination/ Transition of Care- [facility]/ Care Management [place] [phone]: direct office

## 2023-09-20 ENCOUNTER — Telehealth: Payer: Self-pay | Admitting: *Deleted

## 2023-09-20 NOTE — Transitions of Care (Post Inpatient/ED Visit) (Signed)
09/20/2023  Name: Hannah Hawkins MRN: [id number] DOB: 04/02/1986  Today's TOC FU Call Status: Today's TOC FU Call Status:: Unsuccessful Call (2nd Attempt) Unsuccessful Call (2nd Attempt) Date: 09/20/23  Attempted to reach the patient regarding the most recent Inpatient visit;  Received automated outgoing voice message stating "sorry the number you have requested cannot be dialed;" call spontaneously ended without option to leave voice mail message requesting call-back   Follow Up Plan: Additional outreach attempts will be made to reach the patient to complete the Transitions of Care (Post Inpatient visit) call.   Caryl Pina, RN, BSN, Media planner  Transitions of Care  [facility] - [facility] [phone]: direct office

## 2023-09-21 ENCOUNTER — Telehealth: Payer: Self-pay | Admitting: *Deleted

## 2023-09-21 NOTE — Transitions of Care (Post Inpatient/ED Visit) (Signed)
09/21/2023  Name: Hannah Hawkins MRN: [id number] DOB: 1986/09/06  Today's TOC FU Call Status: Today's TOC FU Call Status:: Unsuccessful Call (3rd Attempt) Unsuccessful Call (3rd Attempt) Date: 09/21/23  Attempted to reach the patient regarding the most recent Inpatient visit; Received automated outgoing voice message stating "sorry the number you have requested cannot be dialed;" call spontaneously ended without option to leave voice mail message requesting call-back   Follow Up Plan: No further outreach attempts will be made at this time. We have been unable to contact the patient.  Caryl Pina, RN, BSN, Media planner  Transitions of Care  [facility] - [facility] [phone]: direct office

## 2023-11-02 ENCOUNTER — Ambulatory Visit: Payer: Self-pay | Admitting: Nurse Practitioner

## 2023-11-02 ENCOUNTER — Ambulatory Visit: Payer: MEDICAID | Admitting: Nurse Practitioner

## 2023-11-09 ENCOUNTER — Encounter (HOSPITAL_COMMUNITY): Payer: Self-pay | Admitting: *Deleted

## 2023-11-09 ENCOUNTER — Other Ambulatory Visit: Payer: Self-pay

## 2023-11-09 ENCOUNTER — Emergency Department (HOSPITAL_COMMUNITY)
Admission: EM | Admit: 2023-11-09 | Discharge: 2023-11-10 | Disposition: A | Discharge status: Home / Self Care | Payer: MEDICAID | Attending: Emergency Medicine | Admitting: Emergency Medicine

## 2023-11-09 ENCOUNTER — Emergency Department (HOSPITAL_COMMUNITY): Payer: MEDICAID

## 2023-11-09 DIAGNOSIS — D5700 Hb-SS disease with crisis, unspecified: Secondary | ICD-10-CM | POA: Diagnosis present

## 2023-11-09 DIAGNOSIS — D5720 Sickle-cell/Hb-C disease without crisis: Secondary | ICD-10-CM

## 2023-11-09 DIAGNOSIS — Z91040 Latex allergy status: Secondary | ICD-10-CM | POA: Diagnosis not present

## 2023-11-09 MED ORDER — HYDROMORPHONE HCL 2 MG/ML IJ SOLN
2.0000 mg | INTRAMUSCULAR | Status: AC
Start: 2023-11-09 — End: 2023-11-09
  Administered 2023-11-09: 2 mg via INTRAVENOUS
  Filled 2023-11-09: qty 1

## 2023-11-09 MED ORDER — DIPHENHYDRAMINE HCL 25 MG PO CAPS
25.0000 mg | ORAL_CAPSULE | ORAL | Status: DC | PRN
  Administered 2023-11-09: 25 mg via ORAL
  Filled 2023-11-09: qty 1

## 2023-11-09 MED ORDER — HYDROMORPHONE HCL 2 MG/ML IJ SOLN
2.0000 mg | INTRAMUSCULAR | Status: AC
Start: 2023-11-10 — End: 2023-11-10
  Administered 2023-11-10: 2 mg via INTRAVENOUS
  Filled 2023-11-09: qty 1

## 2023-11-09 MED ORDER — ONDANSETRON HCL 4 MG/2ML IJ SOLN
4.0000 mg | INTRAMUSCULAR | Status: DC | PRN
  Administered 2023-11-09: 4 mg via INTRAVENOUS
  Filled 2023-11-09: qty 2

## 2023-11-09 MED ORDER — KETOROLAC TROMETHAMINE 15 MG/ML IJ SOLN
15.0000 mg | INTRAMUSCULAR | Status: AC
  Administered 2023-11-10: 15 mg via INTRAVENOUS
  Filled 2023-11-09: qty 1

## 2023-11-09 MED ORDER — HYDROMORPHONE HCL 2 MG/ML IJ SOLN
2.0000 mg | INTRAMUSCULAR | Status: AC
Start: 2023-11-09 — End: 2023-11-10
  Administered 2023-11-10: 2 mg via INTRAVENOUS
  Filled 2023-11-09: qty 1

## 2023-11-09 NOTE — ED Notes (Signed)
Lab draw unsuccessful 

## 2023-11-09 NOTE — ED Provider Triage Note (Signed)
Emergency Medicine Provider Triage Evaluation Note  Hannah Hawkins , a 37 y.o. female  was evaluated in triage.  Pt complains of having a sickle cell crisis.  Pt complains of discomfort in the epigastric area   Review of Systems  Positive: Pain, nausea and vomiting Negative: fever  Physical Exam  BP (!) 149/97 (BP Location: Left Arm)   Pulse 70   Temp 98.8 F (37.1 C) (Oral)   Resp 18   SpO2 93%  Gen:   Awake, no distress   Resp:  Normal effort  MSK:   Moves extremities without difficulty  Other:    Medical Decision Making  Medically screening exam initiated at 7:22 PM.  Appropriate orders placed.  Hannah Hawkins was informed that the remainder of the evaluation will be completed by another provider, this initial triage assessment does not replace that evaluation, and the importance of remaining in the ED until their evaluation is complete.     Elson Areas, [place] 11/09/23 1928

## 2023-11-09 NOTE — ED Triage Notes (Signed)
Pt endorsing sickle cell crisis, started today, she is is having pain in the right leg and back. No meds for the same. Epigastric pain, with nausea and vomiting, intermittent for the last couple of weeks.

## 2023-11-10 LAB — CBC WITH DIFFERENTIAL/PLATELET
Abs Immature Granulocytes: 0.05 10*3/uL (ref 0.00–0.07)
Basophils Absolute: 0.1 10*3/uL (ref 0.0–0.1)
Basophils Relative: 1 %
Eosinophils Absolute: 0.5 10*3/uL (ref 0.0–0.5)
Eosinophils Relative: 4 %
HCT: 32.6 % — ABNORMAL LOW (ref 36.0–46.0)
Hemoglobin: 11.9 g/dL — ABNORMAL LOW (ref 12.0–15.0)
Immature Granulocytes: 0 %
Lymphocytes Relative: 37 %
Lymphs Abs: 4.5 10*3/uL — ABNORMAL HIGH (ref 0.7–4.0)
MCH: 27.5 pg (ref 26.0–34.0)
MCHC: 36.5 g/dL — ABNORMAL HIGH (ref 30.0–36.0)
MCV: 75.3 fL — ABNORMAL LOW (ref 80.0–100.0)
Monocytes Absolute: 1.3 10*3/uL — ABNORMAL HIGH (ref 0.1–1.0)
Monocytes Relative: 11 %
Neutro Abs: 5.8 10*3/uL (ref 1.7–7.7)
Neutrophils Relative %: 47 %
Platelets: 312 10*3/uL (ref 150–400)
RBC: 4.33 MIL/uL (ref 3.87–5.11)
RDW: 14.5 % (ref 11.5–15.5)
WBC: 12.2 10*3/uL — ABNORMAL HIGH (ref 4.0–10.5)
nRBC: 2.2 % — ABNORMAL HIGH (ref 0.0–0.2)

## 2023-11-10 LAB — LIPASE, BLOOD: Lipase: 35 U/L (ref 11–51)

## 2023-11-10 LAB — RETICULOCYTES
Immature Retic Fract: 28.8 % — ABNORMAL HIGH (ref 2.3–15.9)
RBC.: 4.31 MIL/uL (ref 3.87–5.11)
Retic Count, Absolute: 121.1 10*3/uL (ref 19.0–186.0)
Retic Ct Pct: 2.8 % (ref 0.4–3.1)

## 2023-11-10 LAB — COMPREHENSIVE METABOLIC PANEL
ALT: 19 U/L (ref 0–44)
AST: 33 U/L (ref 15–41)
Albumin: 4.5 g/dL (ref 3.5–5.0)
Alkaline Phosphatase: 60 U/L (ref 38–126)
Anion gap: 6 (ref 5–15)
BUN: 9 mg/dL (ref 6–20)
CO2: 23 mmol/L (ref 22–32)
Calcium: 8.6 mg/dL — ABNORMAL LOW (ref 8.9–10.3)
Chloride: 103 mmol/L (ref 98–111)
Creatinine, Ser: 0.88 mg/dL (ref 0.44–1.00)
GFR, Estimated: >60 mL/min (ref >60)
Glucose, Bld: 111 mg/dL — ABNORMAL HIGH (ref 70–99)
Potassium: 2.9 mmol/L — ABNORMAL LOW (ref 3.5–5.1)
Sodium: 132 mmol/L — ABNORMAL LOW (ref 135–145)
Total Bilirubin: 1.2 mg/dL — ABNORMAL HIGH (ref <1.2)
Total Protein: 7.5 g/dL (ref 6.5–8.1)

## 2023-11-10 LAB — HCG, SERUM, QUALITATIVE: Preg, Serum: NEGATIVE

## 2023-11-10 MED ORDER — HYDROMORPHONE HCL 2 MG/ML IJ SOLN
2.0000 mg | Freq: Once | INTRAMUSCULAR | Status: AC
  Administered 2023-11-10: 2 mg via INTRAVENOUS
  Filled 2023-11-10: qty 1

## 2023-11-10 MED ORDER — POTASSIUM CHLORIDE CRYS ER 20 MEQ PO TBCR
40.0000 meq | EXTENDED_RELEASE_TABLET | Freq: Once | ORAL | Status: AC
  Administered 2023-11-10: 40 meq via ORAL
  Filled 2023-11-10: qty 2

## 2023-11-10 MED ORDER — FLUTICASONE PROPIONATE 50 MCG/ACT NA SUSP
2.0000 | Freq: Once | NASAL | Status: AC
  Administered 2023-11-10: 2 via NASAL
  Filled 2023-11-10: qty 16

## 2023-11-10 MED ORDER — GABAPENTIN 300 MG PO CAPS: 300.0000 mg | ORAL_CAPSULE | Freq: Three times a day (TID) | ORAL | 3 refills | Status: AC

## 2023-11-10 NOTE — ED Provider Notes (Signed)
[place] EMERGENCY DEPARTMENT AT [facility] Provider Note   CSN: [id number] Arrival date & time: 11/09/23  1846     History  Chief Complaint  Patient presents with   Sickle Cell Pain Crisis    Hannah Hawkins is a 37 y.o. female.  Patient with past medical history significant for sickle cell disease, chronic pain syndrome, GERD presents to the emergency department complaining of generalized pain all over her body with pain worse in the right leg and back.  She states she has been out of her home medications for some time and took no medication at home prior to coming to the emergency department including over-the-counter medication such as ibuprofen.  She denies shortness of breath, chest pain, nausea, vomiting.  She does endorse epigastric pain thought to be due to her diet.  Patient states she works at [employer] and frequently eats on shift.  She does have a history of cholecystectomy.   Sickle Cell Pain Crisis      Home Medications Prior to Admission medications   Medication Sig Start Date End Date Taking? Authorizing Provider  etodolac (LODINE) 400 MG tablet Take 1 tablet (400 mg total) by mouth 2 (two) times daily. Patient not taking: Reported on 07/15/2023 06/30/23   Marita Kansas, PA-C  gabapentin (NEURONTIN) 300 MG capsule Take 1 capsule (300 mg total) by mouth 3 (three) times daily. 11/10/23   Darrick Grinder, PA-C  ibuprofen (ADVIL) 200 MG tablet Take 200 mg by mouth every 6 (six) hours as needed for fever, headache or mild pain. Patient not taking: Reported on 07/15/2023    [provider]  Oxycodone HCl 10 MG TABS Take 1 tablet (10 mg total) by mouth every 4 (four) hours as needed. Patient not taking: Reported on 07/15/2023 07/07/23   Dione Booze, MD  oxyCODONE-acetaminophen (PERCOCET) 10-325 MG tablet Take 1 tablet by mouth every 4 (four) hours as needed for pain. 07/11/23   Sabas Sous, MD      Allergies    Latex and Penicillins    Review of  Systems   Review of Systems  Physical Exam Updated Vital Signs BP 110/89   Pulse 97   Temp 97.9 F (36.6 C) (Oral)   Resp 14   Ht 5\' 3"  (1.6 m)   Wt 73.9 kg   LMP 10/16/2023   SpO2 95%   BMI 28.87 kg/m  Physical Exam Vitals and nursing note reviewed.  Constitutional:      General: She is not in acute distress.    Appearance: She is well-developed.  HENT:     Head: Normocephalic and atraumatic.  Eyes:     Conjunctiva/sclera: Conjunctivae normal.  Cardiovascular:     Rate and Rhythm: Normal rate and regular rhythm.  Pulmonary:     Effort: Pulmonary effort is normal. No respiratory distress.     Breath sounds: Normal breath sounds.  Abdominal:     Palpations: Abdomen is soft.     Tenderness: There is no abdominal tenderness.  Musculoskeletal:        General: No swelling.     Cervical back: Neck supple.  Skin:    General: Skin is warm and dry.     Capillary Refill: Capillary refill takes less than 2 seconds.  Neurological:     Mental Status: She is alert.  Psychiatric:        Mood and Affect: Mood normal.     ED Results / Procedures / Treatments   Labs (all labs  ordered are listed, but only abnormal results are displayed) Labs Reviewed  COMPREHENSIVE METABOLIC PANEL - Abnormal; Notable for the following components:      Result Value   Sodium 132 (*)    Potassium 2.9 (*)    Glucose, Bld 111 (*)    Calcium 8.6 (*)    Total Bilirubin 1.2 (*)    All other components within normal limits  CBC WITH DIFFERENTIAL/PLATELET - Abnormal; Notable for the following components:   WBC 12.2 (*)    Hemoglobin 11.9 (*)    HCT 32.6 (*)    MCV 75.3 (*)    MCHC 36.5 (*)    nRBC 2.2 (*)    Lymphs Abs 4.5 (*)    Monocytes Absolute 1.3 (*)    All other components within normal limits  RETICULOCYTES - Abnormal; Notable for the following components:   Immature Retic Fract 28.8 (*)    All other components within normal limits  HCG, SERUM, QUALITATIVE  LIPASE, BLOOD     EKG EKG Interpretation Date/Time:  Wednesday November 09 2023 19:44:13 EST Ventricular Rate:  64 PR Interval:  149 QRS Duration:  83 QT Interval:  446 QTC Calculation: 461 R Axis:   75  Text Interpretation: Sinus rhythm Abnormal R-wave progression, early transition Baseline wander in lead(s) V4 When compared with ECG of 07/03/2023, No significant change was found Confirmed by Dione Booze ([id number]) on 11/10/2023 1:49:34 AM  Radiology DG Chest Port 1 View  Result Date: 11/09/2023 CLINICAL DATA:  Chest pain.  Sickle cell crisis. EXAM: PORTABLE CHEST 1 VIEW COMPARISON:  02/08/2023 FINDINGS: The cardiomediastinal contours are normal. The lungs are clear. Pulmonary vasculature is normal. No consolidation, pleural effusion, or pneumothorax. No acute osseous abnormalities are seen. IMPRESSION: No active disease. Electronically Signed   By: Narda Rutherford M.D.   On: 11/09/2023 23:26    Procedures .Critical Care  Performed by: Darrick Grinder, PA-C Authorized by: Darrick Grinder, PA-C   Critical care provider statement:    Critical care time (minutes):  30   Critical care was necessary to treat or prevent imminent or life-threatening deterioration of the following conditions: Sickle cell crisis.   Critical care was time spent personally by me on the following activities:  Development of treatment plan with patient or surrogate, discussions with consultants, evaluation of patient's response to treatment, examination of patient, ordering and review of laboratory studies, ordering and review of radiographic studies, ordering and performing treatments and interventions, pulse oximetry, re-evaluation of patient's condition and review of old charts     Medications Ordered in ED Medications  diphenhydrAMINE (BENADRYL) capsule 25-50 mg (25 mg Oral Given 11/09/23 2324)  ondansetron (ZOFRAN) injection 4 mg (4 mg Intravenous Given 11/09/23 2325)  potassium chloride SA (KLOR-CON M) CR tablet 40  mEq (has no administration in time range)  ketorolac (TORADOL) 15 MG/ML injection 15 mg (15 mg Intravenous Given 11/10/23 0003)  HYDROmorphone (DILAUDID) injection 2 mg (2 mg Intravenous Given 11/09/23 2325)  HYDROmorphone (DILAUDID) injection 2 mg (2 mg Intravenous Given 11/10/23 0005)  HYDROmorphone (DILAUDID) injection 2 mg (2 mg Intravenous Given 11/10/23 0057)  fluticasone (FLONASE) 50 MCG/ACT nasal spray 2 spray (2 sprays Each Nare Given 11/10/23 0040)  HYDROmorphone (DILAUDID) injection 2 mg (2 mg Intravenous Given 11/10/23 0219)    ED Course/ Medical Decision Making/ A&P  Medical Decision Making Amount and/or Complexity of Data Reviewed Labs: ordered. Radiology: ordered.  Risk Prescription drug management.   This patient presents to the ED for concern of possible sickle cell crisis, this involves an extensive number of treatment options, and is a complaint that carries with it a high risk of complications and morbidity.  The differential diagnosis includes sickle cell disease with pain, chronic pain syndrome, sickle cell crisis, others   Co morbidities that complicate the patient evaluation  Sickle cell disease, GERD   Additional history obtained:  External records from outside source obtained and reviewed including hematology notes from [facility].  Patient with upcoming appointment on December 10.  Patient has not been provided refills of medication recently.   Lab Tests:  I Ordered, and personally interpreted labs.  The pertinent results include: Potassium 2.9, CBC at baseline, reticulocytes with immature reticulocyte fraction of 28.8   Imaging Studies ordered:  I ordered imaging studies including chest x-ray I independently visualized and interpreted imaging which showed no active disease I agree with the radiologist interpretation   Cardiac Monitoring: / EKG:  The patient was maintained on a cardiac monitor.  I personally viewed  and interpreted the cardiac monitored which showed an underlying rhythm of: Sinus rhythm    Problem List / ED Course / Critical interventions / Medication management   I ordered medication including Dilaudid for pain, Toradol for pain, Benadryl for itching, Zofran for nausea, Flonase for nasal congestion, potassium for hypokalemia Reevaluation of the patient after these medicines showed that the patient improved I have reviewed the patients home medicines and have made adjustments as needed   Social Determinants of Health:  Social Determinants of Health with Concerns   Tobacco Use: Medium Risk (11/09/2023)   Patient History    Smoking Tobacco Use: Former    Smokeless Tobacco Use: Never    Passive Exposure: Not on file  Financial Resource Strain: Low Risk  (09/19/2023)   Received from [facility]   Overall Financial Resource Strain (CARDIA)    Difficulty of Paying Living Expenses: Not hard at all  Recent Concern: Financial Resource Strain - Medium Risk (09/16/2023)   Received from [facility]   Overall Financial Resource Strain (CARDIA)    Difficulty of Paying Living Expenses: Somewhat hard  Food Insecurity: Food Insecurity Present (09/19/2023)   Received from [facility]   Hunger Vital Sign    Worried About Running Out of Food in the Last Year: Sometimes true    Ran Out of Food in the Last Year: Sometimes true  Physical Activity: Not on file  Stress: Stress Concern Present (02/16/2022)   Received from [facility] - Occupational Stress Questionnaire    Feeling of Stress : Very much  Social Connections: Unknown (04/19/2022)   Received from [facility]   Social Network    Social Network: Not on file  Depression [phone]): Low Risk  (07/15/2023)   Depression (PHQ2-9)    PHQ-2 Score: 0  Recent Concern: Depression (PHQ2-9) - High Risk (05/06/2023)    Depression (PHQ2-9)    PHQ-2 Score: 13  Housing: Medium Risk (04/21/2023)   Housing    Last Housing Risk Score: 1  Health Literacy: Not on file      Test / Admission - Considered:  Patient with grossly unremarkable laboratory workup this evening.  She is feeling much better.  Patient does not want to be admitted  at this time.  Question sickle cell disease with pain versus chronic pain syndrome.  Do not believe this was an acute event.  Plan to discharge at this time with recommendations for follow-up with her hematology clinic.  Patient requested refills of both oxycodone and gabapentin.  I told the patient she would have to follow-up with her hematologist for  narcotic pain medication but that I would send a short course of gabapentin.  Patient safe for discharge home at this time.         Final Clinical Impression(s) / ED Diagnoses Final diagnoses:  Sickle-cell disease with pain (HCC)    Rx / DC Orders ED Discharge Orders          Ordered    gabapentin (NEURONTIN) 300 MG capsule  3 times daily        11/10/23 0402              Pamala Duffel 11/10/23 0403    Dione Booze, MD 11/10/23 [phone]

## 2023-11-10 NOTE — Discharge Instructions (Signed)
I sent a refill of your gabapentin to the pharmacy.  Please follow-up with your hematologist for further evaluation and management.  If you develop life-threatening symptoms return to the emergency department.

## 2023-11-10 NOTE — Progress Notes (Signed)
Per unit RN pt has patent PIV and labs have been collected

## 2024-01-15 ENCOUNTER — Emergency Department (HOSPITAL_COMMUNITY)
Admission: EM | Admit: 2024-01-15 | Discharge: 2024-01-15 | Disposition: A | Discharge status: Home / Self Care | Payer: MEDICAID | Attending: Emergency Medicine | Admitting: Emergency Medicine

## 2024-01-15 ENCOUNTER — Emergency Department (HOSPITAL_COMMUNITY): Payer: MEDICAID

## 2024-01-15 ENCOUNTER — Other Ambulatory Visit: Payer: Self-pay

## 2024-01-15 DIAGNOSIS — Z91040 Latex allergy status: Secondary | ICD-10-CM | POA: Insufficient documentation

## 2024-01-15 DIAGNOSIS — R197 Diarrhea, unspecified: Secondary | ICD-10-CM | POA: Insufficient documentation

## 2024-01-15 DIAGNOSIS — M25552 Pain in left hip: Secondary | ICD-10-CM | POA: Diagnosis not present

## 2024-01-15 DIAGNOSIS — M79601 Pain in right arm: Secondary | ICD-10-CM | POA: Insufficient documentation

## 2024-01-15 DIAGNOSIS — R1084 Generalized abdominal pain: Secondary | ICD-10-CM | POA: Insufficient documentation

## 2024-01-15 DIAGNOSIS — M79602 Pain in left arm: Secondary | ICD-10-CM | POA: Diagnosis not present

## 2024-01-15 DIAGNOSIS — D5700 Hb-SS disease with crisis, unspecified: Secondary | ICD-10-CM | POA: Insufficient documentation

## 2024-01-15 DIAGNOSIS — Z20822 Contact with and (suspected) exposure to covid-19: Secondary | ICD-10-CM | POA: Insufficient documentation

## 2024-01-15 LAB — CBC WITH DIFFERENTIAL/PLATELET
Abs Immature Granulocytes: 0.05 10*3/uL (ref 0.00–0.07)
Basophils Absolute: 0.1 10*3/uL (ref 0.0–0.1)
Basophils Relative: 1 %
Eosinophils Absolute: 1.4 10*3/uL — ABNORMAL HIGH (ref 0.0–0.5)
Eosinophils Relative: 15 %
HCT: 32.5 % — ABNORMAL LOW (ref 36.0–46.0)
Hemoglobin: 11.7 g/dL — ABNORMAL LOW (ref 12.0–15.0)
Immature Granulocytes: 1 %
Lymphocytes Relative: 22 %
Lymphs Abs: 2 10*3/uL (ref 0.7–4.0)
MCH: 26.2 pg (ref 26.0–34.0)
MCHC: 36 g/dL (ref 30.0–36.0)
MCV: 72.9 fL — ABNORMAL LOW (ref 80.0–100.0)
Monocytes Absolute: 0.9 10*3/uL (ref 0.1–1.0)
Monocytes Relative: 10 %
Neutro Abs: 4.9 10*3/uL (ref 1.7–7.7)
Neutrophils Relative %: 51 %
Platelets: 345 10*3/uL (ref 150–400)
RBC: 4.46 MIL/uL (ref 3.87–5.11)
RDW: 13.3 % (ref 11.5–15.5)
WBC: 9.4 10*3/uL (ref 4.0–10.5)
nRBC: 1 % — ABNORMAL HIGH (ref 0.0–0.2)

## 2024-01-15 LAB — COMPREHENSIVE METABOLIC PANEL
ALT: 16 U/L (ref 0–44)
AST: 20 U/L (ref 15–41)
Albumin: 4.1 g/dL (ref 3.5–5.0)
Alkaline Phosphatase: 54 U/L (ref 38–126)
Anion gap: 8 (ref 5–15)
BUN: 6 mg/dL (ref 6–20)
CO2: 26 mmol/L (ref 22–32)
Calcium: 9 mg/dL (ref 8.9–10.3)
Chloride: 106 mmol/L (ref 98–111)
Creatinine, Ser: 0.89 mg/dL (ref 0.44–1.00)
GFR, Estimated: >60 mL/min (ref >60)
Glucose, Bld: 92 mg/dL (ref 70–99)
Potassium: 3.6 mmol/L (ref 3.5–5.1)
Sodium: 140 mmol/L (ref 135–145)
Total Bilirubin: 0.7 mg/dL (ref 0.0–1.2)
Total Protein: 8 g/dL (ref 6.5–8.1)

## 2024-01-15 LAB — RESP PANEL BY RT-PCR (RSV, FLU A&B, COVID)  RVPGX2
Influenza A by PCR: NEGATIVE
Influenza B by PCR: NEGATIVE
Resp Syncytial Virus by PCR: NEGATIVE
SARS Coronavirus 2 by RT PCR: NEGATIVE

## 2024-01-15 LAB — LIPASE, BLOOD: Lipase: 78 U/L — ABNORMAL HIGH (ref 11–51)

## 2024-01-15 LAB — HCG, SERUM, QUALITATIVE: Preg, Serum: NEGATIVE

## 2024-01-15 MED ORDER — ONDANSETRON 4 MG PO TBDP
4.0000 mg | ORAL_TABLET | Freq: Three times a day (TID) | ORAL | 0 refills | Status: AC | PRN
Start: 2024-01-15 — End: ?

## 2024-01-15 MED ORDER — CIPROFLOXACIN HCL 500 MG PO TABS: 500.0000 mg | ORAL_TABLET | Freq: Two times a day (BID) | ORAL | 0 refills | Status: AC

## 2024-01-15 MED ORDER — OXYCODONE HCL 5 MG PO TABS
5.0000 mg | ORAL_TABLET | Freq: Once | ORAL | Status: AC
  Administered 2024-01-15: 5 mg via ORAL
  Filled 2024-01-15: qty 1

## 2024-01-15 MED ORDER — OXYCODONE HCL 5 MG PO TABS
15.0000 mg | ORAL_TABLET | Freq: Once | ORAL | Status: AC
  Administered 2024-01-15: 15 mg via ORAL
  Filled 2024-01-15: qty 3

## 2024-01-15 MED ORDER — ONDANSETRON HCL 4 MG/2ML IJ SOLN
4.0000 mg | Freq: Once | INTRAMUSCULAR | Status: AC
  Administered 2024-01-15: 4 mg via INTRAVENOUS
  Filled 2024-01-15: qty 2

## 2024-01-15 MED ORDER — IOHEXOL 300 MG/ML  SOLN
100.0000 mL | Freq: Once | INTRAMUSCULAR | Status: AC | PRN
  Administered 2024-01-15: 100 mL via INTRAVENOUS

## 2024-01-15 MED ORDER — HYDROMORPHONE HCL 1 MG/ML IJ SOLN
1.0000 mg | Freq: Once | INTRAMUSCULAR | Status: AC
  Administered 2024-01-15: 1 mg via INTRAVENOUS
  Filled 2024-01-15: qty 1

## 2024-01-15 MED ORDER — SODIUM CHLORIDE 0.9 % IV BOLUS
1000.0000 mL | Freq: Once | INTRAVENOUS | Status: AC
  Administered 2024-01-15: 1000 mL via INTRAVENOUS

## 2024-01-15 MED ORDER — METRONIDAZOLE 500 MG PO TABS: 500.0000 mg | ORAL_TABLET | Freq: Two times a day (BID) | ORAL | 0 refills | Status: AC

## 2024-01-15 MED ORDER — CIPROFLOXACIN HCL 500 MG PO TABS
500.0000 mg | ORAL_TABLET | Freq: Once | ORAL | Status: AC
  Administered 2024-01-15: 500 mg via ORAL
  Filled 2024-01-15: qty 1

## 2024-01-15 MED ORDER — METRONIDAZOLE 500 MG PO TABS
500.0000 mg | ORAL_TABLET | Freq: Once | ORAL | Status: AC
  Administered 2024-01-15: 500 mg via ORAL
  Filled 2024-01-15: qty 1

## 2024-01-15 NOTE — ED Provider Notes (Signed)
[place] EMERGENCY DEPARTMENT AT [facility] Provider Note   CSN: [id number] Arrival date & time: 01/15/24  1457     History  Chief Complaint  Patient presents with   Sickle Cell Pain Crisis    Hannah Hawkins is a 38 y.o. female.   Sickle Cell Pain Crisis 38 year old female history of sickle cell anemia presenting for pain and diarrhea.  She has had 3 days of diarrhea and generalized abdominal pain.  Some nausea but no vomiting.  Her last couple days she has had bodyaches, left hip pain, pain in her arms similar to prior sickle cell pain.  No chest pain or shortness of breath.  Possible cough.  No fevers or chills.  Was admitted a couple weeks ago at [facility] for sickle cell pain crisis.  No blood in her stool.     Home Medications Prior to Admission medications   Medication Sig Start Date End Date Taking? Authorizing Provider  ciprofloxacin (CIPRO) 500 MG tablet Take 1 tablet (500 mg total) by mouth every 12 (twelve) hours for 7 days. 01/15/24 01/22/24 Yes Laurence Spates, MD  gabapentin (NEURONTIN) 300 MG capsule Take 1 capsule (300 mg total) by mouth 3 (three) times daily. 11/10/23  Yes Darrick Grinder, PA-C  metroNIDAZOLE (FLAGYL) 500 MG tablet Take 1 tablet (500 mg total) by mouth 2 (two) times daily. 01/15/24  Yes Laurence Spates, MD  ondansetron (ZOFRAN-ODT) 4 MG disintegrating tablet Take 1 tablet (4 mg total) by mouth every 8 (eight) hours as needed. 01/15/24  Yes Laurence Spates, MD      Allergies    Latex and Penicillins    Review of Systems   Review of Systems Review of systems completed and notable as per HPI.  ROS otherwise negative.   Physical Exam Updated Vital Signs BP (!) 140/99   Pulse 62   Temp 97.6 F (36.4 C) (Oral)   Resp 16   Ht 5\' 3"  (1.6 m)   Wt 73.9 kg   LMP 01/15/2024   SpO2 100%   BMI 28.87 kg/m  Physical Exam Vitals and nursing note reviewed.  Constitutional:      General: She is not in acute distress.    Appearance:  She is well-developed.  HENT:     Head: Normocephalic and atraumatic.     Mouth/Throat:     Mouth: Mucous membranes are moist.     Pharynx: Oropharynx is clear.  Eyes:     Extraocular Movements: Extraocular movements intact.     Conjunctiva/sclera: Conjunctivae normal.     Pupils: Pupils are equal, round, and reactive to light.  Cardiovascular:     Rate and Rhythm: Normal rate and regular rhythm.     Pulses: Normal pulses.     Heart sounds: Normal heart sounds. No murmur heard. Pulmonary:     Effort: Pulmonary effort is normal. No respiratory distress.     Breath sounds: Normal breath sounds.  Abdominal:     Palpations: Abdomen is soft.     Tenderness: There is abdominal tenderness. There is no right CVA tenderness, left CVA tenderness, guarding or rebound.     Comments: Mild diffuse tenderness  Musculoskeletal:        General: No swelling.     Cervical back: Neck supple.     Right lower leg: No edema.     Left lower leg: No edema.  Skin:    General: Skin is warm and dry.     Capillary Refill: Capillary  refill takes less than 2 seconds.  Neurological:     General: No focal deficit present.     Mental Status: She is alert and oriented to person, place, and time. Mental status is at baseline.  Psychiatric:        Mood and Affect: Mood normal.     ED Results / Procedures / Treatments   Labs (all labs ordered are listed, but only abnormal results are displayed) Labs Reviewed  LIPASE, BLOOD - Abnormal; Notable for the following components:      Result Value   Lipase 78 (*)    All other components within normal limits  CBC WITH DIFFERENTIAL/PLATELET - Abnormal; Notable for the following components:   Hemoglobin 11.7 (*)    HCT 32.5 (*)    MCV 72.9 (*)    nRBC 1.0 (*)    Eosinophils Absolute 1.4 (*)    All other components within normal limits  RESP PANEL BY RT-PCR (RSV, FLU A&B, COVID)  RVPGX2  COMPREHENSIVE METABOLIC PANEL  HCG, SERUM, QUALITATIVE  URINALYSIS,  ROUTINE W REFLEX MICROSCOPIC  PREGNANCY, URINE  RETICULOCYTES    EKG None  Radiology CT ABDOMEN PELVIS W CONTRAST Result Date: 01/15/2024 CLINICAL DATA:  Abdominal pain, acute, nonlocalized. Sickle cell pain, diarrhea for 3 days. EXAM: CT ABDOMEN AND PELVIS WITH CONTRAST TECHNIQUE: Multidetector CT imaging of the abdomen and pelvis was performed using the standard protocol following bolus administration of intravenous contrast. RADIATION DOSE REDUCTION: This exam was performed according to the departmental dose-optimization program which includes automated exposure control, adjustment of the mA and/or kV according to patient size and/or use of iterative reconstruction technique. CONTRAST:  OMNIPAQUE IOHEXOL 300 MG/ML  SOLN COMPARISON:  03/15/2021. FINDINGS: Lower chest: Atelectasis is present at the lung bases. Hepatobiliary: No focal liver abnormality is seen. Fatty infiltration of the liver is noted. Status post cholecystectomy. No biliary dilatation. Pancreas: Unremarkable. No pancreatic ductal dilatation or surrounding inflammatory changes. Spleen: Small size.  No focal abnormality. Adrenals/Urinary Tract: The adrenal glands are within normal limits. The kidneys enhance symmetrically. No renal calculus or hydronephrosis bilaterally. The bladder is unremarkable. Stomach/Bowel: Stomach is within normal limits. No bowel obstruction, free air, or pneumatosis is seen. A normal appendix is present in the right lower quadrant. A few scattered diverticula are present along the colon without evidence of diverticulitis. There is mucosal enhancement involving the sigmoid colon and rectum. Vascular/Lymphatic: No significant vascular findings are present. No enlarged abdominal or pelvic lymph nodes. Reproductive: Uterus and bilateral adnexa are unremarkable. Other: No abdominopelvic ascites. Musculoskeletal: No acute osseous abnormality is seen. IMPRESSION: 1. Mild mucosal enhancement involving the sigmoid  colon and rectum, possible infectious or inflammatory colitis. 2. Diverticulosis without diverticulitis. 3. Hepatic steatosis. Electronically Signed   By: Thornell Sartorius M.D.   On: 01/15/2024 19:09   DG Chest 2 View Result Date: 01/15/2024 CLINICAL DATA:  Cough. EXAM: CHEST - 2 VIEW COMPARISON:  11/09/2023. FINDINGS: Bilateral lung fields are clear. Bilateral costophrenic angles are clear. Normal cardio-mediastinal silhouette. No acute osseous abnormalities. The soft tissues are within normal limits. IMPRESSION: No active cardiopulmonary disease. Electronically Signed   By: Jules Schick M.D.   On: 01/15/2024 18:42    Procedures Procedures    Medications Ordered in ED Medications  oxyCODONE (Oxy IR/ROXICODONE) immediate release tablet 5 mg (has no administration in time range)  sodium chloride 0.9 % bolus 1,000 mL (0 mLs Intravenous Stopped 01/15/24 2132)  HYDROmorphone (DILAUDID) injection 1 mg (1 mg Intravenous Given 01/15/24 1656)  ondansetron (ZOFRAN) injection 4 mg (4 mg Intravenous Given 01/15/24 1655)  iohexol (OMNIPAQUE) 300 MG/ML solution 100 mL (100 mLs Intravenous Contrast Given 01/15/24 1802)  HYDROmorphone (DILAUDID) injection 1 mg (1 mg Intravenous Given 01/15/24 1815)  ondansetron (ZOFRAN) injection 4 mg (4 mg Intravenous Given 01/15/24 1819)  oxyCODONE (Oxy IR/ROXICODONE) immediate release tablet 15 mg (15 mg Oral Given 01/15/24 1900)  ciprofloxacin (CIPRO) tablet 500 mg (500 mg Oral Given 01/15/24 2136)  metroNIDAZOLE (FLAGYL) tablet 500 mg (500 mg Oral Given 01/15/24 2136)    ED Course/ Medical Decision Making/ A&P Clinical Course as of 01/15/24 2154  Sun Jan 15, 2024  2120 Pain control.  Tolerating p.o.  CT with colitis will treat with Cipro Flagyl. [JD]    Clinical Course User Index [JD] Laurence Spates, MD                                 Medical Decision Making Amount and/or Complexity of Data Reviewed Labs: ordered. Radiology: ordered.  Risk Prescription drug  management.   Medical Decision Making:   Hannah Hawkins is a 38 y.o. female who presented to the ED today with abdominal pain, diarrhea, sickle cell pain.  Vital signs reviewed.  She is well-appearing, she has had few days of diarrhea and has diffuse tenderness.  Is probably more likely due to gastroenteritis, however given tenderness will obtain CT scan eval for appendicitis, diverticulitis, colitis.  She has possible cough although no chest pain or shortness of breath lower concern for acute chest syndrome.  She is afebrile.   Patient placed on continuous vitals and telemetry monitoring while in ED which was reviewed periodically.  Reviewed and confirmed nursing documentation for past medical history, family history, social history.  Reassessment and Plan:   On reassessment her pain is better controlled.  She has had no additional diarrhea and is tolerating p.o. without difficulty.  Ambulated without difficulty.  Labs with stable anemia, normal electrolytes.  Lipase slightly elevated but not consistent with acute pancreatitis.  Chest x-ray unremarkable.  CT scan with likely infectious colitis.  Given history of sickle cell will treat with Cipro, Flagyl.  She was given first dose here.  Recommend she follow-up closely with her primary care doctor.  Strict return precautions given.   Patient's presentation is most consistent with acute complicated illness / injury requiring diagnostic workup.           Final Clinical Impression(s) / ED Diagnoses Final diagnoses:  Sickle cell pain crisis (HCC)    Rx / DC Orders ED Discharge Orders          Ordered    ondansetron (ZOFRAN-ODT) 4 MG disintegrating tablet  Every 8 hours PRN        01/15/24 2121    ciprofloxacin (CIPRO) 500 MG tablet  Every 12 hours        01/15/24 2122    metroNIDAZOLE (FLAGYL) 500 MG tablet  2 times daily        01/15/24 2122              Laurence Spates, MD 01/15/24 2154

## 2024-01-15 NOTE — ED Notes (Signed)
Patient ambulated out of department to pick up her doordash order.

## 2024-01-15 NOTE — Discharge Instructions (Signed)
Follow-up with your primary care doctor.  Return for new or worsening symptoms.

## 2024-01-15 NOTE — ED Notes (Signed)
Pt requesting pain meds. Dr. Earlene Plater notified

## 2024-01-15 NOTE — ED Notes (Signed)
Pt requesting nausea meds. Dr. Earlene Plater notified

## 2024-01-15 NOTE — ED Notes (Signed)
Pt in ct

## 2024-01-15 NOTE — ED Triage Notes (Signed)
Patient to ED by POV with c/o sickle cell pain. She also states she has had diarrhea x3 days.

## 2024-01-16 ENCOUNTER — Emergency Department (HOSPITAL_COMMUNITY)
Admission: EM | Admit: 2024-01-16 | Discharge: 2024-01-17 | Disposition: A | Discharge status: Home / Self Care | Payer: MEDICAID | Attending: Emergency Medicine | Admitting: Emergency Medicine

## 2024-01-16 ENCOUNTER — Other Ambulatory Visit: Payer: Self-pay

## 2024-01-16 ENCOUNTER — Encounter (HOSPITAL_COMMUNITY): Payer: Self-pay | Admitting: Emergency Medicine

## 2024-01-16 DIAGNOSIS — M79604 Pain in right leg: Secondary | ICD-10-CM | POA: Diagnosis not present

## 2024-01-16 DIAGNOSIS — M79605 Pain in left leg: Secondary | ICD-10-CM | POA: Insufficient documentation

## 2024-01-16 DIAGNOSIS — D5700 Hb-SS disease with crisis, unspecified: Secondary | ICD-10-CM | POA: Diagnosis present

## 2024-01-16 LAB — COMPREHENSIVE METABOLIC PANEL
ALT: 13 U/L (ref 0–44)
AST: 19 U/L (ref 15–41)
Albumin: 4 g/dL (ref 3.5–5.0)
Alkaline Phosphatase: 49 U/L (ref 38–126)
Anion gap: 9 (ref 5–15)
BUN: 7 mg/dL (ref 6–20)
CO2: 22 mmol/L (ref 22–32)
Calcium: 8.8 mg/dL — ABNORMAL LOW (ref 8.9–10.3)
Chloride: 106 mmol/L (ref 98–111)
Creatinine, Ser: 0.79 mg/dL (ref 0.44–1.00)
GFR, Estimated: >60 mL/min (ref >60)
Glucose, Bld: 88 mg/dL (ref 70–99)
Potassium: 3.6 mmol/L (ref 3.5–5.1)
Sodium: 137 mmol/L (ref 135–145)
Total Bilirubin: 0.9 mg/dL (ref 0.0–1.2)
Total Protein: 7.5 g/dL (ref 6.5–8.1)

## 2024-01-16 LAB — CBC WITH DIFFERENTIAL/PLATELET
Abs Immature Granulocytes: 0 10*3/uL (ref 0.00–0.07)
Basophils Absolute: 0.1 10*3/uL (ref 0.0–0.1)
Basophils Relative: 1 %
Eosinophils Absolute: 0.6 10*3/uL — ABNORMAL HIGH (ref 0.0–0.5)
Eosinophils Relative: 7 %
HCT: 32.1 % — ABNORMAL LOW (ref 36.0–46.0)
Hemoglobin: 11.5 g/dL — ABNORMAL LOW (ref 12.0–15.0)
Lymphocytes Relative: 35 %
Lymphs Abs: 3.1 10*3/uL (ref 0.7–4.0)
MCH: 26.1 pg (ref 26.0–34.0)
MCHC: 35.8 g/dL (ref 30.0–36.0)
MCV: 72.8 fL — ABNORMAL LOW (ref 80.0–100.0)
Monocytes Absolute: 0.1 10*3/uL (ref 0.1–1.0)
Monocytes Relative: 1 %
Neutro Abs: 5 10*3/uL (ref 1.7–7.7)
Neutrophils Relative %: 56 %
Platelets: 408 10*3/uL — ABNORMAL HIGH (ref 150–400)
RBC: 4.41 MIL/uL (ref 3.87–5.11)
RDW: 13.3 % (ref 11.5–15.5)
WBC: 8.9 10*3/uL (ref 4.0–10.5)
nRBC: 1 % — ABNORMAL HIGH (ref 0.0–0.2)

## 2024-01-16 LAB — HCG, SERUM, QUALITATIVE: Preg, Serum: NEGATIVE

## 2024-01-16 LAB — RETICULOCYTES
Immature Retic Fract: 27.4 % — ABNORMAL HIGH (ref 2.3–15.9)
RBC.: 4.3 MIL/uL (ref 3.87–5.11)
Retic Count, Absolute: 154.8 10*3/uL (ref 19.0–186.0)
Retic Ct Pct: 3.6 % — ABNORMAL HIGH (ref 0.4–3.1)

## 2024-01-16 MED ORDER — DIPHENHYDRAMINE HCL 25 MG PO CAPS
25.0000 mg | ORAL_CAPSULE | ORAL | Status: DC | PRN
  Administered 2024-01-16: 50 mg via ORAL
  Filled 2024-01-16: qty 2

## 2024-01-16 MED ORDER — HYDROMORPHONE HCL 1 MG/ML IJ SOLN: 1.0000 mg | Freq: Once | INTRAMUSCULAR | Status: DC

## 2024-01-16 MED ORDER — HYDROMORPHONE HCL 2 MG/ML IJ SOLN: 2.0000 mg | INTRAMUSCULAR | Status: DC

## 2024-01-16 MED ORDER — HYDROMORPHONE HCL 2 MG/ML IJ SOLN
2.0000 mg | INTRAMUSCULAR | Status: AC
  Administered 2024-01-16: 2 mg via INTRAVENOUS
  Filled 2024-01-16: qty 1

## 2024-01-16 MED ORDER — ONDANSETRON HCL 4 MG/2ML IJ SOLN
4.0000 mg | INTRAMUSCULAR | Status: DC | PRN
  Administered 2024-01-16: 4 mg via INTRAVENOUS
  Filled 2024-01-16: qty 2

## 2024-01-16 MED ORDER — OXYCODONE HCL 5 MG PO TABS: 5.0000 mg | ORAL_TABLET | Freq: Once | ORAL | Status: DC

## 2024-01-16 NOTE — ED Provider Triage Note (Signed)
Emergency Medicine Provider Triage Evaluation Note  Hannah Hawkins , a 38 y.o. female  was evaluated in triage.  Pt complains of sickle cell pain.  Patient was seen yesterday and ultimately discharged after pain improved however states that she was discharged at a 6 out of 10 pain and does not feel she should have been discharged.  Patient states that she has pain behind both of her legs which is her normal sickle cell pain.  Patient has chest pain shortness of breath.  Patient was also diagnosed with colitis yesterday and states she has been taking her antibiotics and denies any abdominal pain nausea vomiting or bowel symptoms.  Patient states that she has been out of her oxycodone at home.  Review of Systems  Positive:  Negative:   Physical Exam  BP (!) 131/91 (BP Location: Left Arm)   Pulse 76   Temp 98.1 F (36.7 C) (Oral)   Resp 16   Ht 5\' 3"  (1.6 m)   Wt 73 kg   LMP 01/15/2024   SpO2 100%   BMI 28.51 kg/m  Gen:   Awake, no distress   Resp:  Normal effort  MSK:   Moves extremities without difficulty  Other:    Medical Decision Making  Medically screening exam initiated at 9:45 PM.  Appropriate orders placed.  Hannah Hawkins was informed that the remainder of the evaluation will be completed by another provider, this initial triage assessment does not replace that evaluation, and the importance of remaining in the ED until their evaluation is complete.  Workup initiated, patient stable at this time.   [name], Cordelia Poche 01/16/24 2145

## 2024-01-16 NOTE — ED Provider Notes (Signed)
[facility] DEPT [facility] Emergency Department Provider Note MRN:  [id number]  Arrival date & time: 01/17/24     Chief Complaint   Sickle Cell Pain Crisis   History of Present Illness   Hannah Hawkins is a 38 y.o. year-old female presents to the ED with chief complaint of sickle cell pain.  She states that she was seen yesterday for the same.  Still having the same pain in her legs.  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  She denies fever, chills, chest pain, SOB.  She states that she had some vomiting and was treated for colitis and states that this has improved.  History provided by patient.   Review of Systems  Pertinent positive and negative review of systems noted in HPI.    Physical Exam   Vitals:   01/16/24 2137  BP: (!) 131/91  Pulse: 76  Resp: 16  Temp: 98.1 F (36.7 C)  SpO2: 100%    CONSTITUTIONAL:  non toxic-appearing, NAD NEURO:  Alert and oriented x 3, CN 3-12 grossly intact EYES:  eyes equal and reactive ENT/NECK:  Supple, no stridor  CARDIO:  normal rate, regular rhythm, appears well-perfused  PULM:  No respiratory distress, GI/GU:  non-distended,  MSK/SPINE:  No gross deformities, no edema, moves all extremities  SKIN:  no rash, atraumatic   *Additional and/or pertinent findings included in MDM below  Diagnostic and Interventional Summary    EKG Interpretation Date/Time:    Ventricular Rate:    PR Interval:    QRS Duration:    QT Interval:    QTC Calculation:   R Axis:      Text Interpretation:         Labs Reviewed  COMPREHENSIVE METABOLIC PANEL - Abnormal; Notable for the following components:      Result Value   Calcium 8.8 (*)    All other components within normal limits  CBC WITH DIFFERENTIAL/PLATELET - Abnormal; Notable for the following components:   Hemoglobin 11.5 (*)    HCT 32.1 (*)    MCV 72.8 (*)    Platelets 408  (*)    nRBC 1.0 (*)    Eosinophils Absolute 0.6 (*)    All other components within normal limits  RETICULOCYTES - Abnormal; Notable for the following components:   Retic Ct Pct 3.6 (*)    Immature Retic Fract 27.4 (*)    All other components within normal limits  HCG, SERUM, QUALITATIVE    No orders to display    Medications  oxyCODONE (Oxy IR/ROXICODONE) immediate release tablet 5 mg (0 mg Oral Hold 01/16/24 2327)  HYDROmorphone (DILAUDID) injection 2 mg (has no administration in time range)  diphenhydrAMINE (BENADRYL) capsule 25-50 mg (50 mg Oral Given 01/16/24 2243)  ondansetron (ZOFRAN) injection 4 mg (4 mg Intravenous Given 01/16/24 2241)  HYDROmorphone (DILAUDID) injection 2 mg (2 mg Intravenous Given 01/16/24 2242)  HYDROmorphone (DILAUDID) injection 2 mg (2 mg Intravenous Given 01/16/24 2325)     Procedures  /  Critical Care Procedures  ED Course and Medical Decision Making  I have reviewed the triage vital signs, the nursing notes, and pertinent available records from the EMR.  Social Determinants Affecting Complexity of Care: Patient has no clinically significant social determinants affecting this chief complaint..   ED Course: Clinical Course as of 01/17/24 0009  Tue Jan 17, 2024  0008 Patient agreeable with plan for discharge  after she was seen by Hospitalist, Dr. Mikeal Hawthorne, who will send her medications.   [RB]    Clinical Course User Index [RB] Roxy Horseman, PA-C    Medical Decision Making Patient here with sickle cell pain.  She doesn't have a PCP or anyone that routinely give her pain medications.  She states that she is working on establishing care.    Controlled substance database notes that she has had about 17 days of oxycodones prescribed for her in the last 30 days, but last RX was 01/06/24 for 7 days, so she should be out of her pain meds.  She states that she continues to have pain and never felt any better from yesterday.  Risk Prescription drug  management.         Consultants: I consult with Dr. Mikeal Hawthorne, who will come to evaluate the patient.  He states that if the patient is willing to go home with Dr. Mikeal Hawthorne refilling all of her pain medications, then he may discharge.   Treatment and Plan: I considered admission due to patient's initial presentation, but after considering the examination and diagnostic results, patient will not require admission and can be discharged with outpatient follow-up.    Final Clinical Impressions(s) / ED Diagnoses     ICD-10-CM   1. Sickle cell pain crisis [facility])  D57.00       ED Discharge Orders     None         Discharge Instructions Discussed with and Provided to Patient:   Discharge Instructions   None      Roxy Horseman, PA-C 01/17/24 Providence Lanius, MD 01/17/24 [phone]

## 2024-01-16 NOTE — ED Triage Notes (Signed)
Patient c/o sickle cell crisis pain. Patient taking PRN medication without relief. Patient denies N/V.

## 2024-01-17 ENCOUNTER — Other Ambulatory Visit: Payer: Self-pay | Admitting: Nurse Practitioner

## 2024-01-17 DIAGNOSIS — D5700 Hb-SS disease with crisis, unspecified: Secondary | ICD-10-CM

## 2024-01-17 MED ORDER — IBUPROFEN 800 MG PO TABS: 800.0000 mg | ORAL_TABLET | Freq: Three times a day (TID) | ORAL | 0 refills | Status: AC | PRN

## 2024-01-17 MED ORDER — OXYCODONE HCL 15 MG PO TABS: 15.0000 mg | ORAL_TABLET | ORAL | 0 refills | Status: AC | PRN

## 2024-01-17 NOTE — Consult Note (Signed)
Initial Consultation Note   Patient: Hannah Hawkins ZOX:[id number] DOB: 1986-10-06 PCP: Donell Beers, FNP DOA: 01/16/2024 DOS: the patient was seen and examined on 01/17/2024 Primary service: Nira Conn, *  Referring physician: [place] Reason for consult: Sickle cell crisis  Assessment/Plan: Assessment and Plan:  #1 sickle cell pain crisis: Patient is here with pain.  She has run out of her home regimen.  She is due to be admitted but patient requests refills if she gets IV fluid she is willing to go home.  I am refilling her oxycodone 15 mg and ibuprofen 800.  Patient will pick it up tonight at the 24-hour [facility].     TRH will sign off at present, please call us again when needed.  HPI: Wai Minotti is a 38 y.o. female with past medical history of sickle cell disease, chronic pain syndrome, anemia of chronic disease who presents to the ER with complaint of pain consistent with her typical sickle cell crisis.  Patient has run out of her home regimen.  She has not been following up to get her prescriptions.  She has been going between [place] and are all.  Patient has received IV Dilaudid x 3 with pain improved but is worried about going home without any prescription.  We are consulted for evaluation and input..  Review of Systems: As mentioned in the history of present illness. All other systems reviewed and are negative. Past Medical History:  Diagnosis Date   Sickle cell anemia (HCC)    Past Surgical History:  Procedure Laterality Date   CHOLECYSTECTOMY N/A 06/03/2021   Procedure: LAPAROSCOPIC CHOLECYSTECTOMY;  Surgeon: Abigail Miyamoto, MD;  Location: [facility];  Service: General;  Laterality: N/A;   EYE SURGERY  03/09/2017   left eye   EYE SURGERY Right 09/2019   right hemerage    Social History:  reports that she has quit smoking. Her smoking use included cigarettes. She has never used smokeless tobacco. She reports current alcohol use. She reports  that she does not currently use drugs after having used the following drugs: Marijuana.  Allergies  Allergen Reactions   Latex Hives and Rash   Penicillins Swelling and Rash    Has patient had a PCN reaction causing immediate rash, facial/tongue/throat swelling, SOB or lightheadedness with hypotension: Yes Has patient had a PCN reaction causing severe rash involving mucus membranes or skin necrosis: Yes Has patient had a PCN reaction that required hospitalization No Has patient had a PCN reaction occurring within the last 10 years: no If all of the above answers are "NO", then may proceed with Cephalosporin use.     Family History  Problem Relation Age of Onset   Diabetes Father     Prior to Admission medications   Medication Sig Start Date End Date Taking? Authorizing Provider  folic acid (FOLVITE) 1 MG tablet Take 1 mg by mouth daily. 12/17/23  Yes [provider]  ciprofloxacin (CIPRO) 500 MG tablet Take 1 tablet (500 mg total) by mouth every 12 (twelve) hours for 7 days. 01/15/24 01/22/24  Laurence Spates, MD  gabapentin (NEURONTIN) 300 MG capsule Take 1 capsule (300 mg total) by mouth 3 (three) times daily. 11/10/23   Darrick Grinder, PA-C  metroNIDAZOLE (FLAGYL) 500 MG tablet Take 1 tablet (500 mg total) by mouth 2 (two) times daily. 01/15/24   Laurence Spates, MD  ondansetron (ZOFRAN-ODT) 4 MG disintegrating tablet Take 1 tablet (4 mg total) by mouth every 8 (eight) hours as  needed. 01/15/24   Laurence Spates, MD    Physical Exam: Vitals:   01/16/24 2134 01/16/24 2137  BP:  (!) 131/91  Pulse:  76  Resp:  16  Temp:  98.1 F (36.7 C)  TempSrc:  Oral  SpO2:  100%  Weight: 73 kg   Height: 5\' 3"  (1.6 m)    Constitutional: NAD, calm, comfortable Eyes: PERRL, lids and conjunctivae normal ENMT: Mucous membranes are moist. Posterior pharynx clear of any exudate or lesions.Normal dentition.  Neck: normal, supple, no masses, no thyromegaly Respiratory: clear to  auscultation bilaterally, no wheezing, no crackles. Normal respiratory effort. No accessory muscle use.  Cardiovascular: Regular rate and rhythm, no murmurs / rubs / gallops. No extremity edema. 2+ pedal pulses. No carotid bruits.  Abdomen: no tenderness, no masses palpated. No hepatosplenomegaly. Bowel sounds positive.  Musculoskeletal: Good range of motion, no joint swelling or tenderness, Skin: no rashes, lesions, ulcers. No induration Neurologic: CN 2-12 grossly intact. Sensation intact, DTR normal. Strength 5/5 in all 4.  Psychiatric: Normal judgment and insight. Alert and oriented x 3. Normal mood  Data Reviewed:   Results are pending, will review when available.    Family Communication: No family at bedside Primary team communication: [phone] Thank you very much for involving [place] in the care of your patient.  AuthorLonia Blood, MD 01/17/2024 12:08 AM  For on call review [email or web address].

## 2024-02-14 ENCOUNTER — Telehealth: Payer: Self-pay

## 2024-02-14 NOTE — Transitions of Care (Post Inpatient/ED Visit) (Signed)
   02/14/2024  Name: Hannah Hawkins MRN: [id number] DOB: [birth year]  Today's TOC FU Call Status: Today's TOC FU Call Status:: Unsuccessful Call (1st Attempt) Unsuccessful Call (1st Attempt) Date: 02/14/24  Attempted to reach the patient regarding the most recent Inpatient/ED visit.  Follow Up Plan: Additional outreach attempts will be made to reach the patient to complete the Transitions of Care (Post Inpatient/ED visit) call.   Wyline Mood BSN, RN Medical illustrator, Transition of Care [place] / [facility], Population Health Direct Dial:  [phone] / Fax: [phone] Email:  [email or web address]

## 2024-02-28 ENCOUNTER — Telehealth: Payer: Self-pay | Admitting: *Deleted

## 2024-02-28 NOTE — Transitions of Care (Post Inpatient/ED Visit) (Signed)
   02/28/2024  Name: Hannah Hawkins MRN: [id number] DOB: 1986/11/03  Today's TOC FU Call Status: Today's TOC FU Call Status:: Successful TOC FU Call Completed TOC FU Call Complete Date: 02/28/24 Patient's Name and Date of Birth confirmed.  Patient states she is no longer a patient at [facility]/ [facility], states " I don't do any [facility] facilities, I only do [facility]"  Transition Care Management Follow-up Telephone Call Date of Discharge: 02/27/24 Discharge Facility: Other [facility]) Name of Other ([facility]) Discharge Facility: [facility] Type of Discharge: Inpatient Admission Primary Inpatient Discharge Diagnosis:: Sickle Cell Pain Crisis      [name] [facility], BSN RN Care Manager/ Transition of Care [place]/ [facility] Population Health [phone]

## 2024-03-30 ENCOUNTER — Telehealth: Payer: Self-pay | Admitting: *Deleted

## 2024-03-30 NOTE — Transitions of Care (Post Inpatient/ED Visit) (Signed)
   03/30/2024  Name: Hannah Hawkins MRN: [id number] DOB: 05-Nov-1986  Today's TOC FU Call Status: Today's TOC FU Call Status:: Unsuccessful Call (1st Attempt) Unsuccessful Call (1st Attempt) Date: 03/30/24  Attempted to reach the patient regarding the most recent Inpatient/ED visit.  Follow Up Plan: Additional outreach attempts will be made to reach the patient to complete the Transitions of Care (Post Inpatient/ED visit) call.   [name] [facility], BSN RN Care Manager/ Transition of Care [place]/ [facility] [phone]

## 2024-04-02 ENCOUNTER — Telehealth: Payer: Self-pay | Admitting: *Deleted

## 2024-04-02 NOTE — Transitions of Care (Post Inpatient/ED Visit) (Signed)
   04/02/2024  Name: Myliah Medel MRN: [id number] DOB: Apr 16, 1986  Today's TOC FU Call Status: Today's TOC FU Call Status:: Unsuccessful Call (2nd Attempt) Unsuccessful Call (2nd Attempt) Date: 04/02/24  Attempted to reach the patient regarding the most recent Inpatient/ED visit.  Follow Up Plan: Additional outreach attempts will be made to reach the patient to complete the Transitions of Care (Post Inpatient/ED visit) call.   Cecilie Coffee [facility], BSN RN Care Manager/ Transition of Care [place]/ [facility] [phone]

## 2024-04-03 ENCOUNTER — Telehealth: Payer: Self-pay | Admitting: *Deleted

## 2024-04-03 NOTE — Transitions of Care (Post Inpatient/ED Visit) (Signed)
   04/03/2024  Name: Hannah Hawkins MRN: [id number] DOB: 1986-02-11  Today's TOC FU Call Status: Today's TOC FU Call Status:: Unsuccessful Call (3rd Attempt) Unsuccessful Call (3rd Attempt) Date: 04/03/24  Attempted to reach the patient regarding the most recent Inpatient/ED visit.  Follow Up Plan: No further outreach attempts will be made at this time. We have been unable to contact the patient.  Cecilie Coffee [facility], BSN RN Care Manager/ Transition of Care [place]/ [facility] [phone]

## 2024-04-27 ENCOUNTER — Telehealth: Payer: Self-pay | Admitting: *Deleted

## 2024-04-27 NOTE — Transitions of Care (Post Inpatient/ED Visit) (Signed)
   04/27/2024  Name: Hannah Hawkins MRN: [id number] DOB: [birth year]  Today's TOC FU Call Status: Today's TOC FU Call Status:: Unsuccessful Call (1st Attempt) Unsuccessful Call (1st Attempt) Date: 04/27/24  Attempted to reach the patient regarding the most recent Inpatient/ED visit.  Follow Up Plan: Additional outreach attempts will be made to reach the patient to complete the Transitions of Care (Post Inpatient/ED visit) call.   Cecilie Coffee [facility], BSN RN Care Manager/ Transition of Care [place]/ [facility] [phone]

## 2024-04-30 ENCOUNTER — Telehealth: Payer: Self-pay | Admitting: *Deleted

## 2024-05-25 ENCOUNTER — Telehealth: Payer: Self-pay | Admitting: *Deleted

## 2024-05-25 NOTE — Transitions of Care (Post Inpatient/ED Visit) (Signed)
   05/25/2024  Name: Hannah Hawkins MRN: [id number] DOB: January 12, 1986  Today's TOC FU Call Status: Today's TOC FU Call Status:: Successful TOC FU Call Completed TOC FU Call Complete Date: 05/25/24 Patient's Name and Date of Birth confirmed.  Transition Care Management Follow-up Telephone Call Date of Discharge: 05/24/24 Discharge Facility: Other ([facility]) Name of [facility]) Discharge Facility: [facility] Type of Discharge: Inpatient Admission Primary Inpatient Discharge Diagnosis:: Sickle cell pain crisis How have you been since you were released from the hospital?: Better (pt states " doing much better, I have everything I need"  pt declined to finish TOC and declined further outreach) Any questions or concerns?: No  Items Reviewed: Did you receive and understand the discharge instructions provided?: Yes Medications obtained,verified, and reconciled?: No (pt declined medication review) Any new allergies since your discharge?: No Dietary orders reviewed?: No (pt declined) Do you have support at home?:  (pt declined)  Medications Reviewed Today: Medications Reviewed Today   Medications were not reviewed in this encounter     Home Care and Equipment/Supplies: Were Home Health Services Ordered?: No Any new equipment or medical supplies ordered?: No  Functional Questionnaire: Do you need assistance with bathing/showering or dressing?:  (pt declined functional assessment)  Follow up appointments reviewed: PCP Follow-up appointment confirmed?:  (pt declined) Specialist Hospital Follow-up appointment confirmed?:  (pt declined) Do you need transportation to your follow-up appointment?:  (pt declined) Do you understand care options if your condition(s) worsen?: Yes-patient verbalized understanding    Cecilie Coffee [facility], BSN RN Care Manager/ Transition of Care [place]/ [facility] Population Health [phone]

## 2024-06-14 ENCOUNTER — Telehealth: Payer: Self-pay

## 2024-06-14 NOTE — Telephone Encounter (Signed)
 error

## 2024-10-21 NOTE — Discharge Summary (Signed)
 [facility] Medicine Discharge Summary  Admit Date: 10/18/2024 Discharge Date: 10/23/2024  Admitting Physician: Juliene Lynwood Quale, MD Discharge Physician: Virl Holmes Sprang, MD  Primary Care Provider: Provider, Phone None  Discharge Destination: Home  Admission Diagnoses:  Sickle cell pain crisis (CMS/HHS-HCC) [D57.00] Vaso-occlusive sickle cell crisis (CMS/HHS-HCC) [D57.00]  Discharge Diagnoses:  Principal Problem:   Sickle cell pain crisis (CMS/HHS-HCC) Active Problems:   Sickle-cell-hemoglobin C disease with crisis (CMS-HCC)   Chronic pain disorder   Chronic anemia   Major depressive disorder, recurrent, in remission Resolved Problems:   * No resolved hospital problems. *  Primary Diagnosis: Admitted for Uncomplicated sick cell pain crisis.   Changes Made (with rationale):  Home suboxone resumed at home dose  To-Do List (incidental findings, follow-up studies, etc.): Follow up with sickle cell clinic/hematology 11/02/2024      Results Pending at Discharge:  None Please see phone numbers at end of this summary for lab contact information.   Follow-up/Care Transition Plan: Sched. appts: Future Appointments  Date Time Provider Department Center  10/24/2024  2:20 PM Cornelio Raisin, PT 1H [facility]  11/02/2024  8:00 AM LAB(PHLEBOTOMY)- 1D DS1D [facility]  11/02/2024  9:00 AM [facility] ADD ON 2N CLINIC HEMATOLOGY [facility]  01/04/2025  3:00 PM D'Aguiar Raoul Lanes, MD [facility] [place]  01/15/2025  3:00 PM Dep, Karolynn Batters, NP HEMATOLOGY [facility]    Follow-up info: Referral placed to [facility] outpatient clinic. Follow up scheduled at [facility] Hematology 11/02/2024      Allergies/Intolerances:  Allergies  Allergen Reactions  . Penicillins Hives, Rash, Swelling and Anaphylaxis    Has patient had a PCN reaction causing immediate rash, facial/tongue/throat swelling, SOB or lightheadedness with hypotension: Yes  Has patient had a PCN  reaction causing severe rash involving mucus membranes or skin necrosis: Yes  Has patient had a PCN reaction that required hospitalization No  Has patient had a PCN reaction occurring within the last 10 years: no  If all of the above answers are NO, then may proceed with Cephalosporin use.  . Latex Hives and Rash     New Adverse Drug Events: none  Medications:     Current Discharge Medication List     START taking these medications      Instructions  hydrOXYzine  HCL 10 MG tablet Quantity: 30 tablet Refills: 0  Commonly known as: ATARAX  Take 1 tablet (10 mg total) by mouth once daily as needed for Anxiety for up to 30 days Last time this was given: Ask your nurse or doctor   lidocaine  4 % patch Quantity: 30 patch Refills: 0  Commonly known as: SALONPAS Place 1 patch onto the skin daily for 30 days Apply patch to the most painful area for up to 12 hours in a 24 hours period. Last time this was given: 1 patch on October 22, 2024 10:09 PM       CONTINUE taking these medications      Instructions  buprenorphine-naloxone  8-2 mg SL film Quantity: 90 Film Refills: 0  Commonly known as: SUBOXONE Place 1 Film (8 mg total) under the tongue every 12 (twelve) hours for 30 days. For breakthrough pain 1 additional film can be taken daily Doctor's comments: Dispense brand name suboxone. Her insurance only covers brand name Suboxone. 30 day supply Last time this was given: Ask your nurse or doctor   diclofenac  1 % topical gel Quantity: 100 g Refills: 0  Commonly known as: VOLTAREN  Apply 2 g topically 4 (four)  times daily   escitalopram oxalate 10 MG tablet Quantity: 30 tablet Refills: 0  Commonly known as: LEXAPRO Take 1 tablet by mouth daily for Mood Last time this was given: 10 mg on October 23, 2024  8:48 AM   fluticasone  propionate 50 mcg/actuation nasal spray Quantity: 16 g Refills: 0  Commonly known as: FLONASE  Place 1 spray into both nostrils every 12 (twelve)  hours Last time this was given: 2 sprays on October 21, 2024  9:43 PM   folic acid  1 MG tablet Quantity: 30 tablet Refills: 0  Commonly known as: FOLVITE  Take 1 tablet (1 mg total) by mouth once daily for 30 days   gabapentin  300 MG capsule Quantity: 810 capsule Refills: 4  Commonly known as: NEURONTIN  Take 3 capsules (900 mg total) by mouth 3 (three) times daily Last time this was given: 300 mg on October 23, 2024  8:48 AM   ibuprofen  800 MG tablet Quantity: 30 tablet Refills: 0  Commonly known as: MOTRIN  Take 1 tablet (800 mg total) by mouth every 8 (eight) hours as needed for Pain Last time this was given: 600 mg on October 23, 2024  8:47 AM   naloxone  4 mg/actuation nasal spray Quantity: 1 each Refills: 0  Commonly known as: NARCAN  Place 1 spray (4 mg total) into one nostril once as needed (if not breathing.) for up to 1 dose   ondansetron  4 MG disintegrating tablet Quantity: 60 tablet Refills: 0  Commonly known as: ZOFRAN -ODT Dissolve 1 tablet (4 mg total) in mouth every 8 (eight) hours as needed for Nausea Last time this was given: 4 mg on October 18, 2024  1:46 AM   risperiDONE  3 MG tablet Quantity: 30 tablet Refills: 0  Commonly known as: RisperDAL  Take 1 tablet by mouth at bedtime Last time this was given: 3 mg on October 22, 2024 11:44 PM   tiZANidine  2 MG tablet Quantity: 60 tablet Refills: 0  Commonly known as: ZANAFLEX  Take 1 tablet (2 mg total) by mouth every 12 (twelve) hours as needed for Muscle spasms for up to 30 days Last time this was given: 2 mg on October 22, 2024 11:50 PM       STOP taking these medications    loratadine  10 mg tablet Commonly known as: CLARITIN    pantoprazole  40 MG DR tablet Commonly known as: PROTONIX          Brief History of Present Illness: Hannah Hawkins is a 38 y.o. female with PMH of HbSC, chronic pain disorder, h/o MRSA abscess, opioid use disorder, cocaine  use, anxiety and GERD presents with persistent  leg pain and swelling.   She has been experiencing persistent pain in both legs, which began before recent rain storm and has progressively worsened. Initially, the pain was mild and intermittent, but by Monday or Tuesday, it intensified, accompanied by swelling in both ankles. Despite taking her regular medications, including Symbiosis, ibuprofen , gabapentin , Tylenol , Suboxone, and tizanidine , the pain has not subsided. The pain is described as sharp and persistent, with a pain level of 10 out of 10, compared to her usual pain level of 4 to 5 on a typical day, localized to her bilateral lower legs with bilateral mild ankle swelling.    _____________________   Hospital Course by Problem:  SICKLE CELL INPATIENT SUMMARY - Medication: oral oxycodone  15 mg transitioned to oral dilaudid  6 mg q4 PRN - Transfusions: none - Complications: none - Discharge meds: IV antibiotics: none; opioids prescribed: none, has suboxone at home  Sickle Cell disease with acute pain crisis, uncomplicated Chronic pain H/o OUD Presents for admission with severe leg pain, no fevers/chills for pain management. Hgb at baseline. Admitted for uncomplicated pain crisis. Reviewed complex management care plan. Patients hematologist evaluated 10/30, recommended s/w from PO Oxy ->  Dilaudid  equivalent, no change to complex care plan at this time and can be readdressed. DVT neg B/l Lower extremity. Care plan does not recommend IV opiates unless pain associated with complicated crisis. Her hematologist evaluated her, recommended against repeat labs with normal CMP and hgb 10, transitioned Oxycodone  to PO dilaudid  6mg  q4hrs prn, and resume Suboxone at discharge. Continue Tylenol , gabapentin , Ibuprofen , tizanidine , and renewed lidocaine  patch, voltaren  prescriptions.            Anxiety Depression Continue Escitalopram QD, Risperdone 3mg  QHS. Both prescribed for 30 days at time of discharge.      Social Drivers of Health with  Concerns   Stress: Stress Concern Present (02/16/2022)   Received from [facility] of Occupational Health - Occupational Stress Questionnaire   . Feeling of Stress : Very much  Depression: Not at risk (06/28/2024)   PHQ-2   . PHQ-2 Score: 2  Recent Concern: Depression - Mild depression (06/14/2024)   PHQ-9   . PHQ-9 Score: 6     _____________________  Discharge Exam:  BP 134/81 (BP Location: Left upper arm, Patient Position: Lying)   Pulse 63   Temp 36.9 C (98.4 F) (Oral)   Resp 18   Ht 160 cm (5' 2.99)   Wt 76.7 kg (169 lb 1.6 oz)   SpO2 100%   BMI 29.96 kg/m     Physical Exam Vitals and nursing note reviewed.  Constitutional:      General: She is not in acute distress. HENT:     Head: Normocephalic and atraumatic.     Nose: Nose normal.     Mouth/Throat:     Mouth: Mucous membranes are moist.     Pharynx: Oropharynx is clear.  Eyes:     General: No scleral icterus. Cardiovascular:     Rate and Rhythm: Normal rate and regular rhythm.     Pulses: Normal pulses.     Heart sounds: Normal heart sounds.  Pulmonary:     Breath sounds: Normal breath sounds.  Abdominal:     General: Bowel sounds are normal.     Palpations: Abdomen is soft.  Musculoskeletal:     Right lower leg: No edema.     Left lower leg: No edema.     Comments: Distal pulses 2+ bilaterally, equal sensation in distal distributions of lower extremities, warm extremities with no bruising lesions, or deformities. No pain with palpation to bilateral heels  Neurological:     General: No focal deficit present.     Mental Status: She is alert and oriented to person, place, and time.     Sensory: No sensory deficit.      Pertinent Lab Testing: Recent Labs  Lab 10/18/24 0055  NA 136  K 4.4  CL 106  CO2 21  BUN 7  CREATININE 0.8  GLUCOSE 85  CALCIUM 8.5*   Recent Labs  Lab 10/18/24 0055  AST 25  ALT 13  ALKPHOS 55  TBILI 0.6    Recent Labs  Lab 10/18/24 0055   WBC 7.9  HGB 10.0*  HCT 28.2*  PLT 286   Recent Labs  Lab 10/18/24 0055  APTT 28.4  INR 1.0  Other Pertinent Labs:  Recent Labs  Lab 10/18/24 0055  WBC 7.9  HGB 10.0*  HCT 28.2*  PLT 286       Pertinent Imaging:   Results for orders placed during the hospital encounter of 10/18/24  US  LOWER EXTREMITY VENOUS BILATERAL  Narrative Procedure: US  LOWER EXTREMITY VENOUS BILATERAL  Indication:  Pain, sickle cell patient, subjective swelling., D57.00 Hb-SS disease with crisis, unspecified (CMS/HHS-HCC)  Comparison: 05/22/2024  Technique: Gray-scale ultrasound and color Doppler images of the deep veins of the bilateral lower extremities from the level of the common femoral vein to the level of the popliteal vein. Spectral Doppler evaluation of bilateral common femoral and popliteal veins. Gray-scale and color Doppler images of bilateral posterior tibial and peroneal veins.  Findings: Spectral Doppler evaluation of the bilateral common femoral veins demonstrates symmetric waveforms.  RIGHT: Common femoral vein: Compressible. No thrombus seen. Proximal portion of the deep femoral vein: No thrombus seen. Proximal portion of the great saphenous vein: No thrombus seen. Femoral vein: Compressible. No thrombus seen. Popliteal vein: Compressible. No thrombus seen.  Visualized portions of the posterior tibial and peroneal veins: No thrombus seen.  LEFT: Common femoral vein: Compressible. No thrombus seen. Proximal portion of the deep femoral vein: No thrombus seen. Proximal portion of the great saphenous vein: No thrombus seen. Femoral vein: Compressible. No thrombus seen. Popliteal vein: Compressible. No thrombus seen.  Visualized portions of the posterior tibial and peroneal veins: No thrombus seen.  Impression: 1. No deep vein thrombosis identified in the bilateral common femoral, femoral, and popliteal veins. 2. No deep vein thrombosis identified in the  visualized portions of the bilateral posterior tibial and peroneal veins.  The preliminary report (critical or emergent communication) was reviewed prior to this dictation and there are no substantial differences between the preliminary results and the impressions in this final report.   Electronically Reviewed by:  Ronal Repine, DO, [facility] Electronically Reviewed on:  10/21/2024 11:01 AM  I have reviewed the images and concur with the above findings.  Electronically Signed by:  Olam Rouse, MD, [facility] Electronically Signed on:  10/21/2024 3:21 PM   _____________________  Code Status: Full Code Goals of care were addressed during this admission: Full code, would like full scope of care.   Status on Discharge:  Current activity: Walks occasionally (10/23/24 0847) Current mobility: No limitation (10/23/24 0847)  Activity Recommendation: activity as tolerated  Other Discharge Instructions: Services setup at discharge: None Tubes/lines at discharge: None  Diet: Diet regular  Wound Care Order Instructions     None       _____________________  Time spent on discharge process: 35 minutes    SARA ALMARIE FERRARI, PA [facility]  10/23/2024   Hospital Contact Information:  [facility]) [facility]) [facility])  Pending tests:  Laboratory: [phone] Microbiology: [phone] Pathology: [phone] Radiology: [phone]  General questions: [phone] Pending tests: Laboratory: [phone] Microbiology: [phone] Pathology: [phone] Radiology: [phone]  General questions:  [phone] Pending tests:  Laboratory: [phone] Microbiology: [phone] Pathology: [phone] Radiology: [phone]  General questions:  [phone] Pending tests: Laboratory: [phone] Microbiology: [phone] Pathology: [phone] Radiology: [phone]  General questions:  [phone]

## 2024-10-24 ENCOUNTER — Telehealth: Payer: Self-pay

## 2024-10-24 NOTE — Transitions of Care (Post Inpatient/ED Visit) (Signed)
 10/24/2024  Patient ID: Hannah Hawkins, female   DOB: Jan 14, 1986, 38 y.o.   MRN: [id number]  HIPAA verified with patient.  Patient states she no longer has Folashade Paseda as her primary care provider. She states her current primary care provider is  Dr. Anuradha Sabapathi at [facility].   Arvin Seip RN, BSN, CCM [employer], Population Health Case Manager Phone: [phone]
